# Patient Record
Sex: Female | Born: 1945
Health system: Southern US, Academic
[De-identification: ages and names within clinical notes are randomized; demographics above are authoritative.]

## PROBLEM LIST (undated history)

## (undated) ENCOUNTER — Telehealth

## (undated) ENCOUNTER — Encounter

## (undated) DIAGNOSIS — J45909 Unspecified asthma, uncomplicated: Secondary | ICD-10-CM

## (undated) DIAGNOSIS — M5430 Sciatica, unspecified side: Secondary | ICD-10-CM

## (undated) DIAGNOSIS — M549 Dorsalgia, unspecified: Secondary | ICD-10-CM

## (undated) DIAGNOSIS — C8590 Non-Hodgkin lymphoma, unspecified, unspecified site: Secondary | ICD-10-CM

## (undated) DIAGNOSIS — K838 Other specified diseases of biliary tract: Secondary | ICD-10-CM

## (undated) DIAGNOSIS — D630 Anemia in neoplastic disease: Secondary | ICD-10-CM

## (undated) DIAGNOSIS — Z9221 Personal history of antineoplastic chemotherapy: Secondary | ICD-10-CM

## (undated) DIAGNOSIS — J069 Acute upper respiratory infection, unspecified: Secondary | ICD-10-CM

## (undated) DIAGNOSIS — J9600 Acute respiratory failure, unspecified whether with hypoxia or hypercapnia: Secondary | ICD-10-CM

## (undated) DIAGNOSIS — I771 Stricture of artery: Secondary | ICD-10-CM

## (undated) DIAGNOSIS — E039 Hypothyroidism, unspecified: Secondary | ICD-10-CM

## (undated) HISTORY — DX: Anemia in neoplastic disease: D63.0

## (undated) HISTORY — DX: Other specified diseases of biliary tract: K83.8

## (undated) HISTORY — PX: NASAL SINUS SURGERY: SHX719

## (undated) HISTORY — DX: Sciatica, unspecified side: M54.30

## (undated) HISTORY — DX: Non-Hodgkin lymphoma, unspecified, unspecified site: C85.90

## (undated) HISTORY — DX: Acute respiratory failure, unspecified whether with hypoxia or hypercapnia: J96.00

## (undated) HISTORY — DX: Dorsalgia, unspecified: M54.9

## (undated) HISTORY — DX: Unspecified asthma, uncomplicated: J45.909

## (undated) HISTORY — DX: Acute upper respiratory infection, unspecified: J06.9

## (undated) HISTORY — DX: Stricture of artery: I77.1

## (undated) HISTORY — DX: Hypothyroidism, unspecified: E03.9

---

## 2000-05-15 HISTORY — PX: BONE MARROW TRANSPLANT: SHX200

## 2004-07-10 ENCOUNTER — Ambulatory Visit: Payer: Self-pay | Admitting: Oncology

## 2004-07-22 ENCOUNTER — Ambulatory Visit: Payer: Self-pay | Admitting: Oncology

## 2004-10-25 ENCOUNTER — Ambulatory Visit: Payer: Self-pay | Admitting: Unknown Physician Specialty

## 2004-10-30 ENCOUNTER — Ambulatory Visit: Payer: Self-pay | Admitting: Oncology

## 2004-12-08 ENCOUNTER — Other Ambulatory Visit: Payer: Self-pay

## 2004-12-08 ENCOUNTER — Inpatient Hospital Stay: Payer: Self-pay | Admitting: Internal Medicine

## 2004-12-09 ENCOUNTER — Other Ambulatory Visit: Payer: Self-pay

## 2004-12-14 ENCOUNTER — Ambulatory Visit: Payer: Self-pay | Admitting: Internal Medicine

## 2004-12-17 ENCOUNTER — Ambulatory Visit: Payer: Self-pay | Admitting: Oncology

## 2004-12-20 ENCOUNTER — Ambulatory Visit: Payer: Self-pay | Admitting: Oncology

## 2005-03-01 ENCOUNTER — Ambulatory Visit: Payer: Self-pay | Admitting: Oncology

## 2005-03-22 ENCOUNTER — Ambulatory Visit: Payer: Self-pay | Admitting: Oncology

## 2005-04-21 ENCOUNTER — Ambulatory Visit: Payer: Self-pay | Admitting: Oncology

## 2005-05-22 ENCOUNTER — Ambulatory Visit: Payer: Self-pay | Admitting: Oncology

## 2005-06-21 ENCOUNTER — Ambulatory Visit: Payer: Self-pay | Admitting: Oncology

## 2005-07-22 ENCOUNTER — Ambulatory Visit: Payer: Self-pay | Admitting: Oncology

## 2005-08-07 ENCOUNTER — Ambulatory Visit: Payer: Self-pay | Admitting: Unknown Physician Specialty

## 2005-09-09 ENCOUNTER — Ambulatory Visit: Payer: Self-pay | Admitting: Unknown Physician Specialty

## 2005-09-25 ENCOUNTER — Ambulatory Visit: Payer: Self-pay | Admitting: Oncology

## 2005-09-27 ENCOUNTER — Ambulatory Visit: Payer: Self-pay | Admitting: Oncology

## 2005-10-20 ENCOUNTER — Ambulatory Visit: Payer: Self-pay | Admitting: Oncology

## 2005-11-19 ENCOUNTER — Ambulatory Visit: Payer: Self-pay | Admitting: Oncology

## 2006-01-27 ENCOUNTER — Ambulatory Visit: Payer: Self-pay | Admitting: Oncology

## 2006-02-14 ENCOUNTER — Emergency Department: Payer: Self-pay | Admitting: Emergency Medicine

## 2006-02-17 ENCOUNTER — Inpatient Hospital Stay: Payer: Self-pay | Admitting: Oncology

## 2006-02-21 ENCOUNTER — Ambulatory Visit: Payer: Self-pay | Admitting: Oncology

## 2006-03-22 ENCOUNTER — Ambulatory Visit: Payer: Self-pay | Admitting: Oncology

## 2006-04-21 ENCOUNTER — Ambulatory Visit: Payer: Self-pay | Admitting: Oncology

## 2006-07-04 ENCOUNTER — Ambulatory Visit: Payer: Self-pay | Admitting: Oncology

## 2006-07-22 ENCOUNTER — Ambulatory Visit: Payer: Self-pay | Admitting: Oncology

## 2006-10-24 ENCOUNTER — Ambulatory Visit: Payer: Self-pay | Admitting: Internal Medicine

## 2006-10-29 ENCOUNTER — Ambulatory Visit: Payer: Self-pay | Admitting: Oncology

## 2006-11-20 ENCOUNTER — Ambulatory Visit: Payer: Self-pay | Admitting: Oncology

## 2006-12-08 ENCOUNTER — Ambulatory Visit: Payer: Self-pay | Admitting: Oncology

## 2007-02-20 ENCOUNTER — Ambulatory Visit: Payer: Self-pay | Admitting: Oncology

## 2007-02-26 ENCOUNTER — Ambulatory Visit: Payer: Self-pay | Admitting: Oncology

## 2007-03-23 ENCOUNTER — Ambulatory Visit: Payer: Self-pay | Admitting: Oncology

## 2007-06-22 ENCOUNTER — Ambulatory Visit: Payer: Self-pay | Admitting: Oncology

## 2007-06-25 ENCOUNTER — Ambulatory Visit: Payer: Self-pay | Admitting: Oncology

## 2007-07-23 ENCOUNTER — Ambulatory Visit: Payer: Self-pay | Admitting: Oncology

## 2007-08-23 ENCOUNTER — Ambulatory Visit: Payer: Self-pay | Admitting: Oncology

## 2007-09-20 ENCOUNTER — Ambulatory Visit: Payer: Self-pay | Admitting: Oncology

## 2007-12-21 ENCOUNTER — Ambulatory Visit: Payer: Self-pay | Admitting: Oncology

## 2007-12-31 ENCOUNTER — Ambulatory Visit: Payer: Self-pay | Admitting: Oncology

## 2008-01-20 ENCOUNTER — Ambulatory Visit: Payer: Self-pay | Admitting: Oncology

## 2008-06-21 ENCOUNTER — Ambulatory Visit: Payer: Self-pay | Admitting: Oncology

## 2008-06-29 ENCOUNTER — Ambulatory Visit: Payer: Self-pay | Admitting: Oncology

## 2008-07-22 ENCOUNTER — Ambulatory Visit: Payer: Self-pay | Admitting: Oncology

## 2008-12-20 ENCOUNTER — Ambulatory Visit: Payer: Self-pay | Admitting: Oncology

## 2009-01-04 ENCOUNTER — Ambulatory Visit: Payer: Self-pay | Admitting: Oncology

## 2009-01-19 ENCOUNTER — Ambulatory Visit: Payer: Self-pay | Admitting: Oncology

## 2009-02-19 ENCOUNTER — Ambulatory Visit: Payer: Self-pay | Admitting: Oncology

## 2009-06-21 ENCOUNTER — Ambulatory Visit: Payer: Self-pay | Admitting: Oncology

## 2009-07-05 ENCOUNTER — Ambulatory Visit: Payer: Self-pay | Admitting: Oncology

## 2009-07-22 ENCOUNTER — Ambulatory Visit: Payer: Self-pay | Admitting: Oncology

## 2009-09-15 ENCOUNTER — Inpatient Hospital Stay: Payer: Self-pay | Admitting: Internal Medicine

## 2010-02-21 ENCOUNTER — Ambulatory Visit: Payer: Self-pay | Admitting: Internal Medicine

## 2010-05-21 ENCOUNTER — Ambulatory Visit: Payer: Self-pay | Admitting: Unknown Physician Specialty

## 2010-06-27 ENCOUNTER — Ambulatory Visit: Payer: Self-pay | Admitting: Oncology

## 2010-07-05 ENCOUNTER — Ambulatory Visit: Payer: Self-pay | Admitting: Oncology

## 2010-07-22 ENCOUNTER — Ambulatory Visit: Payer: Self-pay | Admitting: Oncology

## 2011-01-29 ENCOUNTER — Encounter: Payer: Self-pay | Admitting: Internal Medicine

## 2011-01-29 ENCOUNTER — Ambulatory Visit (INDEPENDENT_AMBULATORY_CARE_PROVIDER_SITE_OTHER): Payer: Medicare Other | Admitting: Internal Medicine

## 2011-01-29 DIAGNOSIS — R05 Cough: Secondary | ICD-10-CM

## 2011-01-29 DIAGNOSIS — R0989 Other specified symptoms and signs involving the circulatory and respiratory systems: Secondary | ICD-10-CM

## 2011-01-29 DIAGNOSIS — R0600 Dyspnea, unspecified: Secondary | ICD-10-CM

## 2011-01-29 MED ORDER — PREDNISONE (PAK) 10 MG PO TABS: ORAL_TABLET | ORAL | Status: AC

## 2011-01-29 MED ORDER — AMOXICILLIN-POT CLAVULANATE 875-125 MG PO TABS: 1.0000 | ORAL_TABLET | Freq: Two times a day (BID) | ORAL | Status: AC

## 2011-01-29 NOTE — Assessment & Plan Note (Signed)
The most common causes of chronic cough in immunocompetent adults include the following: upper airway cough syndrome (UACS), previously referred to as postnasal drip syndrome (PNDS), which is caused by variety of rhinosinus conditions; (2) asthma; (3) GERD; (4) chronic bronchitis from cigarette smoking or other inhaled environmental irritants; (5) nonasthmatic eosinophilic bronchitis; and (6) bronchiectasis.   These conditions, singly or in combination, have accounted for up to 94% of the causes of chronic cough in prospective studies.   Other conditions have constituted no >6% of the causes in prospective studies These have included bronchogenic carcinoma, chronic interstitial pneumonia, sarcoidosis, left ventricular failure, ACEI-induced cough, and aspiration from a condition associated with pharyngeal dysfunction.   This is almost certainly a form of  Classic Upper airway cough syndrome, so named because it's frequently impossible to sort out how much is  CR/sinusitis with freq throat clearing (which can be related to primary GERD)   vs  causing  secondary (" extra esophageal")  GERD from wide swings in gastric pressure that occur with throat clearing, often  promoting self use of mint and menthol lozenges that reduce the lower esophageal sphincter tone and exacerbate the problem further in a cyclical fashion.   These are the same pts who not infrequently have failed to tolerate ace inhibitors,  dry powder inhalers or biphosphonates or report having reflux symptoms that don't respond to standard doses of PPI , and are easily confused as having aecopd or asthma flares,   Will rx aggressively for cr/sinusitis then f/u here or with Dr Jenne Campus before going any further

## 2011-01-29 NOTE — Patient Instructions (Signed)
See flyer regarding treating of chronic sinusitis  Augmentin take with bfast and with supper with large glass of water for 21 days then call me or Jenne Campus if not satisfied  Prednisone 10 mg take  4 each am x 2 days,   2 each am x 2 days,  1 each am x2days and stop    If you are satisfied with your treatment plan let your doctor know and he/she can either refill your medications or you can return here when your prescription runs out.     If in any way you are not 100% satisfied,  please tell us.  If 100% better, tell your friends!

## 2011-01-29 NOTE — Progress Notes (Signed)
Subjective:     Patient ID: Anna Mcbride, female   DOB: 05-12-1946, 65 y.o.   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 Recurrence which didn't prove to be the case. And no treatment since 2002  01/29/2011 Initial pulmonary office eval cc intermittent episodes of cough and sob x 5 years, always recover completely but still took advair and flonase and combivent prn between these episodes  then  Always sick since Jan 2012 when  "caught cold" =  Sore throat , sinus congestion, cough > dry rattle and wheezing and short of breath with yellow green some better but frustrated that takes so many abx and wants to try something else.  Sob with more than slow adls.  Symptoms day > night.  Sleeping ok without nocturnal  or early am exac of resp c/o's or need for noct saba.  Pt denies any significant  dysphagia, itching, sneezing,  fever, chills, sweats, unintended wt loss, pleuritic or exertional cp, hempoptysis, orthopnea pnd or leg swelling.    Also denies any obvious fluctuation of symptoms with weather or environmental changes or other aggravating or alleviating factors.  No better on advair    Review of Systems  Constitutional: Negative for fever, chills and unexpected weight change.  HENT: Positive for congestion and sore throat. Negative for ear pain, nosebleeds, rhinorrhea, sneezing, trouble swallowing, dental problem, voice change, postnasal drip and sinus pressure.   Eyes: Negative for visual disturbance.  Respiratory: Positive for cough and shortness of breath. Negative for choking.   Cardiovascular: Negative for chest pain and leg swelling.  Gastrointestinal: Negative for vomiting, abdominal pain and diarrhea.  Genitourinary: Negative for difficulty urinating.  Musculoskeletal: Negative for arthralgias.  Skin: Negative for rash.  Neurological: Negative for  tremors, syncope and headaches.  Hematological: Does not bruise/bleed easily.       Objective:   Physical Exam Wt 129 01/29/2011 amb thin wf nad with rattling upper airway cough on fvc maneuver only HEENT mild turbinate edema.  Oropharynx no thrush or excess pnd or cobblestoning.  No JVD or cervical adenopathy. Mild accessory muscle hypertrophy. Trachea midline, nl thryroid. Chest was hyperinflated by percussion with diminished breath sounds and moderate increased exp time without wheeze. Hoover sign positive at mid inspiration. Regular rate and rhythm without murmur gallop or rub or increase P2 or edema.  Abd: no hsm, nl excursion. Ext warm without cyanosis or clubbing.      cxr 01/25/11 no acute changes, has post treatment changes on R with superior retraction of the right hilum unchanged since 09/19/10  Assessment:          Plan:

## 2011-03-12 ENCOUNTER — Ambulatory Visit: Payer: Self-pay | Admitting: Internal Medicine

## 2011-05-22 ENCOUNTER — Ambulatory Visit: Payer: Self-pay | Admitting: Oncology

## 2011-05-23 ENCOUNTER — Ambulatory Visit: Payer: Self-pay | Admitting: Oncology

## 2011-06-04 ENCOUNTER — Ambulatory Visit: Payer: Self-pay | Admitting: Oncology

## 2011-06-22 ENCOUNTER — Ambulatory Visit: Payer: Self-pay | Admitting: Oncology

## 2011-06-25 ENCOUNTER — Ambulatory Visit: Payer: Self-pay | Admitting: Internal Medicine

## 2011-06-26 ENCOUNTER — Ambulatory Visit: Payer: Self-pay | Admitting: Oncology

## 2011-07-04 LAB — PATHOLOGY REPORT

## 2011-07-23 ENCOUNTER — Ambulatory Visit: Payer: Self-pay | Admitting: Oncology

## 2011-10-03 ENCOUNTER — Ambulatory Visit: Payer: Self-pay | Admitting: Oncology

## 2011-10-03 LAB — COMPREHENSIVE METABOLIC PANEL
Anion Gap: 6 — ABNORMAL LOW (ref 7–16)
BUN: 27 mg/dL — ABNORMAL HIGH (ref 7–18)
Bilirubin,Total: 0.3 mg/dL (ref 0.2–1.0)
Chloride: 107 mmol/L (ref 98–107)
Co2: 32 mmol/L (ref 21–32)
Creatinine: 0.92 mg/dL (ref 0.60–1.30)
EGFR (African American): >60
EGFR (Non-African Amer.): >60
Glucose: 77 mg/dL (ref 65–99)
Osmolality: 293 (ref 275–301)
Potassium: 3.9 mmol/L (ref 3.5–5.1)
SGOT(AST): 17 U/L (ref 15–37)
SGPT (ALT): 26 U/L
Sodium: 145 mmol/L (ref 136–145)
Total Protein: 7.3 g/dL (ref 6.4–8.2)

## 2011-10-03 LAB — CBC CANCER CENTER
Basophil #: 0 x10 3/mm (ref 0.0–0.1)
Basophil %: 0.6 %
Eosinophil #: 0.6 x10 3/mm (ref 0.0–0.7)
Eosinophil %: 10.8 %
HGB: 12.4 g/dL (ref 12.0–16.0)
Lymphocyte #: 1.1 x10 3/mm (ref 1.0–3.6)
Lymphocyte %: 20.6 %
MCHC: 32.6 g/dL (ref 32.0–36.0)
Neutrophil %: 58.5 %
Platelet: 151 x10 3/mm (ref 150–440)
RDW: 13.5 % (ref 11.5–14.5)
WBC: 5.3 x10 3/mm (ref 3.6–11.0)

## 2011-10-03 LAB — LACTATE DEHYDROGENASE: LDH: 165 U/L (ref 84–246)

## 2011-10-21 ENCOUNTER — Ambulatory Visit: Payer: Self-pay | Admitting: Oncology

## 2011-11-05 LAB — CREATININE, SERUM
Creatinine: 0.79 mg/dL
EGFR (African American): >60
EGFR (Non-African Amer.): >60

## 2011-11-20 ENCOUNTER — Ambulatory Visit: Payer: Self-pay | Admitting: Oncology

## 2011-12-25 ENCOUNTER — Ambulatory Visit: Payer: Self-pay | Admitting: Oncology

## 2011-12-25 LAB — COMPREHENSIVE METABOLIC PANEL
Albumin: 3.5 g/dL (ref 3.4–5.0)
Anion Gap: 8 (ref 7–16)
Calcium, Total: 9.3 mg/dL (ref 8.5–10.1)
Chloride: 103 mmol/L (ref 98–107)
Co2: 30 mmol/L (ref 21–32)
EGFR (Non-African Amer.): >60
Potassium: 4.1 mmol/L (ref 3.5–5.1)
SGPT (ALT): 21 U/L
Sodium: 141 mmol/L (ref 136–145)
Total Protein: 7.4 g/dL (ref 6.4–8.2)

## 2011-12-25 LAB — CBC CANCER CENTER
Basophil #: 0 x10 3/mm (ref 0.0–0.1)
Eosinophil #: 0.5 x10 3/mm (ref 0.0–0.7)
Eosinophil %: 6.8 %
HCT: 39.3 % (ref 35.0–47.0)
Lymphocyte #: 1.3 x10 3/mm (ref 1.0–3.6)
MCV: 102 fL — ABNORMAL HIGH (ref 80–100)
Monocyte #: 0.8 x10 3/mm (ref 0.2–0.9)
Monocyte %: 11.1 %
Neutrophil #: 4.5 x10 3/mm (ref 1.4–6.5)
Platelet: 146 x10 3/mm — ABNORMAL LOW (ref 150–440)
RBC: 3.86 10*6/uL (ref 3.80–5.20)
WBC: 7.1 x10 3/mm (ref 3.6–11.0)

## 2012-01-20 ENCOUNTER — Ambulatory Visit: Payer: Self-pay | Admitting: Oncology

## 2012-03-17 ENCOUNTER — Ambulatory Visit: Payer: Self-pay | Admitting: Internal Medicine

## 2012-03-30 ENCOUNTER — Ambulatory Visit: Payer: Self-pay | Admitting: Oncology

## 2012-03-30 LAB — CBC CANCER CENTER
Basophil %: 1.3 %
Eosinophil #: 0.3 x10 3/mm (ref 0.0–0.7)
HCT: 36.5 % (ref 35.0–47.0)
HGB: 12.7 g/dL (ref 12.0–16.0)
Lymphocyte #: 1.3 x10 3/mm (ref 1.0–3.6)
MCH: 34.5 pg — ABNORMAL HIGH (ref 26.0–34.0)
MCHC: 34.7 g/dL (ref 32.0–36.0)
MCV: 99 fL (ref 80–100)
Monocyte #: 0.6 x10 3/mm (ref 0.2–0.9)
Neutrophil #: 3.7 x10 3/mm (ref 1.4–6.5)
RBC: 3.67 10*6/uL — ABNORMAL LOW (ref 3.80–5.20)
WBC: 6 x10 3/mm (ref 3.6–11.0)

## 2012-03-30 LAB — COMPREHENSIVE METABOLIC PANEL
Albumin: 4 g/dL (ref 3.4–5.0)
Alkaline Phosphatase: 66 U/L (ref 50–136)
BUN: 38 mg/dL — ABNORMAL HIGH (ref 7–18)
Bilirubin,Total: 0.5 mg/dL (ref 0.2–1.0)
Calcium, Total: 9.9 mg/dL (ref 8.5–10.1)
Chloride: 105 mmol/L (ref 98–107)
Creatinine: 0.92 mg/dL (ref 0.60–1.30)
EGFR (African American): >60
EGFR (Non-African Amer.): >60
Glucose: 80 mg/dL (ref 65–99)
Potassium: 3.8 mmol/L (ref 3.5–5.1)
SGOT(AST): 24 U/L (ref 15–37)
SGPT (ALT): 22 U/L (ref 12–78)
Total Protein: 7.2 g/dL (ref 6.4–8.2)

## 2012-04-21 ENCOUNTER — Ambulatory Visit: Payer: Self-pay | Admitting: Oncology

## 2012-05-22 ENCOUNTER — Ambulatory Visit: Payer: Self-pay | Admitting: Oncology

## 2012-09-19 ENCOUNTER — Ambulatory Visit: Payer: Self-pay | Admitting: Oncology

## 2012-09-28 LAB — CBC CANCER CENTER
Basophil #: 0.1 "x10 3/mm "
Basophil %: 1.2 %
Eosinophil #: 0.3 "x10 3/mm "
Eosinophil %: 6.7 %
HCT: 37.8 %
HGB: 12.8 g/dL
Lymphocyte %: 19.6 %
Lymphs Abs: 0.9 "x10 3/mm " — ABNORMAL LOW
MCH: 33.7 pg
MCHC: 33.7 g/dL
MCV: 100 fL
Monocyte #: 0.6 "x10 3/mm "
Monocyte %: 13.5 %
Neutrophil #: 2.6 "x10 3/mm "
Neutrophil %: 59 %
Platelet: 135 "x10 3/mm " — ABNORMAL LOW
RBC: 3.79 "x10 6/mm " — ABNORMAL LOW
RDW: 13.1 %
WBC: 4.4 "x10 3/mm "

## 2012-09-28 LAB — COMPREHENSIVE METABOLIC PANEL
Alkaline Phosphatase: 83 U/L (ref 50–136)
BUN: 23 mg/dL — ABNORMAL HIGH (ref 7–18)
Bilirubin,Total: 0.2 mg/dL (ref 0.2–1.0)
Calcium, Total: 9.1 mg/dL (ref 8.5–10.1)
Chloride: 103 mmol/L (ref 98–107)
Co2: 30 mmol/L (ref 21–32)
EGFR (African American): >60
EGFR (Non-African Amer.): >60
Glucose: 93 mg/dL (ref 65–99)
SGPT (ALT): 23 U/L (ref 12–78)
Sodium: 141 mmol/L (ref 136–145)

## 2012-10-09 ENCOUNTER — Encounter: Payer: Self-pay | Admitting: Unknown Physician Specialty

## 2012-10-20 ENCOUNTER — Ambulatory Visit: Payer: Self-pay | Admitting: Oncology

## 2012-10-20 ENCOUNTER — Encounter: Payer: Self-pay | Admitting: Unknown Physician Specialty

## 2013-03-03 ENCOUNTER — Ambulatory Visit: Payer: Self-pay | Admitting: Oncology

## 2013-03-03 LAB — CREATININE, SERUM
Creatinine: 0.96 mg/dL (ref 0.60–1.30)
EGFR (Non-African Amer.): >60

## 2013-03-18 ENCOUNTER — Ambulatory Visit: Payer: Self-pay | Admitting: Internal Medicine

## 2013-03-22 ENCOUNTER — Ambulatory Visit: Payer: Self-pay | Admitting: Oncology

## 2013-03-30 ENCOUNTER — Ambulatory Visit: Payer: Self-pay | Admitting: Oncology

## 2013-03-31 LAB — CBC CANCER CENTER
Basophil #: 0.1 "x10 3/mm "
Basophil %: 1.3 %
Eosinophil #: 1.3 "x10 3/mm " — ABNORMAL HIGH
Eosinophil %: 21 %
HCT: 36.6 %
HGB: 12 g/dL
Lymphocyte %: 17.4 %
Lymphs Abs: 1.1 "x10 3/mm "
MCH: 32.1 pg
MCHC: 32.9 g/dL
MCV: 98 fL
Monocyte #: 0.6 "x10 3/mm "
Monocyte %: 10.2 %
Neutrophil #: 3.1 "x10 3/mm "
Neutrophil %: 50.1 %
Platelet: 150 "x10 3/mm "
RBC: 3.75 "x10 6/mm " — ABNORMAL LOW
RDW: 14.5 %
WBC: 6.2 "x10 3/mm "

## 2013-03-31 LAB — COMPREHENSIVE METABOLIC PANEL WITH GFR
Albumin: 3.8 g/dL
Alkaline Phosphatase: 85 U/L
Anion Gap: 9
BUN: 21 mg/dL — ABNORMAL HIGH
Bilirubin,Total: 0.5 mg/dL
Calcium, Total: 9.6 mg/dL
Chloride: 104 mmol/L
Co2: 28 mmol/L
Creatinine: 0.87 mg/dL
EGFR (African American): >60
EGFR (Non-African Amer.): >60
Glucose: 95 mg/dL
Osmolality: 284
Potassium: 3.7 mmol/L
SGOT(AST): 12 U/L — ABNORMAL LOW
SGPT (ALT): 20 U/L
Sodium: 141 mmol/L
Total Protein: 7.1 g/dL

## 2013-03-31 LAB — LACTATE DEHYDROGENASE: LDH: 161 U/L (ref 81–246)

## 2013-03-31 LAB — SEDIMENTATION RATE: Erythrocyte Sed Rate: 34 mm/h — ABNORMAL HIGH

## 2013-04-16 ENCOUNTER — Ambulatory Visit: Payer: Self-pay | Admitting: Internal Medicine

## 2013-04-16 ENCOUNTER — Emergency Department: Payer: Self-pay | Admitting: Emergency Medicine

## 2013-04-16 LAB — URINALYSIS, COMPLETE
Bacteria: NONE SEEN
Bilirubin,UR: NEGATIVE
Glucose,UR: NEGATIVE mg/dL (ref 0–75)
Ketone: NEGATIVE
Leukocyte Esterase: NEGATIVE
Nitrite: NEGATIVE
Ph: 5 (ref 4.5–8.0)
Protein: NEGATIVE
RBC,UR: <1 /HPF (ref 0–5)
Specific Gravity: 1.011 (ref 1.003–1.030)
WBC UR: NONE SEEN /HPF (ref 0–5)

## 2013-04-16 LAB — COMPREHENSIVE METABOLIC PANEL
BUN: 15 mg/dL (ref 7–18)
Bilirubin,Total: 0.4 mg/dL (ref 0.2–1.0)
Calcium, Total: 8.7 mg/dL (ref 8.5–10.1)
Chloride: 108 mmol/L — ABNORMAL HIGH (ref 98–107)
Co2: 28 mmol/L (ref 21–32)
Creatinine: 0.79 mg/dL (ref 0.60–1.30)
EGFR (African American): >60
EGFR (Non-African Amer.): >60
Glucose: 87 mg/dL (ref 65–99)
Potassium: 3.8 mmol/L (ref 3.5–5.1)
SGOT(AST): 19 U/L (ref 15–37)
SGPT (ALT): 18 U/L (ref 12–78)
Total Protein: 6.6 g/dL (ref 6.4–8.2)

## 2013-04-16 LAB — CBC
MCV: 97 fL (ref 80–100)
RBC: 3.62 10*6/uL — ABNORMAL LOW (ref 3.80–5.20)
WBC: 8.7 10*3/uL (ref 3.6–11.0)

## 2013-04-19 LAB — COMPREHENSIVE METABOLIC PANEL
Alkaline Phosphatase: 75 U/L (ref 50–136)
BUN: 16 mg/dL (ref 7–18)
Chloride: 103 mmol/L (ref 98–107)
Creatinine: 0.86 mg/dL (ref 0.60–1.30)
EGFR (African American): >60
EGFR (Non-African Amer.): >60
Glucose: 88 mg/dL (ref 65–99)
Potassium: 4.1 mmol/L (ref 3.5–5.1)
SGOT(AST): 12 U/L — ABNORMAL LOW (ref 15–37)
SGPT (ALT): 17 U/L (ref 12–78)
Sodium: 142 mmol/L (ref 136–145)

## 2013-04-19 LAB — CBC CANCER CENTER
Basophil #: 0.1 x10 3/mm (ref 0.0–0.1)
Basophil %: 1.4 %
Eosinophil %: 8.8 %
HCT: 38.9 % (ref 35.0–47.0)
HGB: 13.2 g/dL (ref 12.0–16.0)
MCH: 33.1 pg (ref 26.0–34.0)
MCHC: 34 g/dL (ref 32.0–36.0)
MCV: 97 fL (ref 80–100)
Monocyte #: 0.7 x10 3/mm (ref 0.2–0.9)
Monocyte %: 12 %
Neutrophil %: 58.2 %
RBC: 4 10*6/uL (ref 3.80–5.20)

## 2013-04-20 ENCOUNTER — Ambulatory Visit (INDEPENDENT_AMBULATORY_CARE_PROVIDER_SITE_OTHER): Payer: Medicare Other | Admitting: General Surgery

## 2013-04-20 ENCOUNTER — Encounter: Payer: Self-pay | Admitting: General Surgery

## 2013-04-20 VITALS — BP 118/62 | HR 68 | Resp 14 | Ht 63.0 in | Wt 130.0 lb

## 2013-04-20 DIAGNOSIS — R590 Localized enlarged lymph nodes: Secondary | ICD-10-CM | POA: Insufficient documentation

## 2013-04-20 DIAGNOSIS — R599 Enlarged lymph nodes, unspecified: Secondary | ICD-10-CM

## 2013-04-20 DIAGNOSIS — Z87898 Personal history of other specified conditions: Secondary | ICD-10-CM

## 2013-04-20 DIAGNOSIS — Z8579 Personal history of other malignant neoplasms of lymphoid, hematopoietic and related tissues: Secondary | ICD-10-CM

## 2013-04-20 NOTE — Patient Instructions (Signed)
Patient to return tomorrow for an excision of a right neck node.

## 2013-04-20 NOTE — Progress Notes (Signed)
Patient ID: Anna Mcbride, female   DOB: [birth year], 67 y.o.   MRN: [id number]  Chief Complaint  Patient presents with  . Other    new patient evaluate right neck node     HPI Anna Mcbride is a 67 y.o. female who presents for an evaluation of a right neck node. 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 The patient states she noticed the node last Thursday 04/15/13. She states she was having abdominal pain in the left upper abdomen so she underwent some tests to showed an enlarged spleen. She has had biopsy of the lungs. EBUS final report is pending. Initial shows no reoccurrence of lymphoma.  HPI  Past Medical History  Diagnosis Date  . Non Hodgkin's lymphoma   . Asthma     Past Surgical History  Procedure Laterality Date  . 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 Nasal sinus surgery  08/07/05 09/09/05    x 2     Family History  Problem Relation Age of Onset  . Asthma Son     Social History History  Substance Use Topics  . Smoking status: Never Smoker   . Smokeless tobacco: Never Used  . Alcohol Use: Yes    Allergies  Allergen Reactions  . Tussionex Pennkinetic Er [Hydrocod Polst-Cpm Polst Er] Itching  . Ciprofloxacin Rash    Current Outpatient Prescriptions  Medication Sig Dispense Refill  . Albuterol Sulfate (VENTOLIN HFA IN) Inhale 2 puffs into the lungs as needed.      [name] aspirin 81 MG tablet Take 81 mg by mouth daily.        . cetirizine (ZYRTEC) 10 MG tablet Take 10 mg by mouth daily.      . citalopram (CELEXA) 20 MG tablet Take 20 mg by mouth daily.        . Fluticasone-Salmeterol (ADVAIR DISKUS) 250-50 MCG/DOSE AEPB Inhale 1 puff into the lungs daily.        [name] levothyroxine (SYNTHROID, LEVOTHROID) 25 MCG tablet Take 25 mcg by mouth daily.        . Multiple Vitamins-Minerals (CENTRUM SILVER PO) Take 1 tablet by mouth daily.        [name] omeprazole (PRILOSEC) 20 MG capsule Take 1 capsule by mouth daily.       No current  facility-administered medications for this visit.    Review of Systems Review of Systems  Constitutional: Negative.   Respiratory: Negative.   Cardiovascular: Negative.     Blood pressure 118/62, pulse 68, resp. rate 14, height 5\' 3"  (1.6 m), weight 130 lb (58.968 kg).  Physical Exam Physical Exam  Constitutional: She is oriented to person, place, and time. She appears well-developed and well-nourished.  Eyes: Conjunctivae are normal. No scleral icterus.  Neck: No thyromegaly present.  Cardiovascular: Normal rate, regular rhythm and normal heart sounds.   No murmur heard. Pulmonary/Chest: Effort normal and breath sounds normal.  Abdominal: Soft. Normal appearance and bowel sounds are normal. There is splenomegaly (palpable tip of spleen under left costal margin.).  Lymphadenopathy:    She has cervical adenopathy (1 cm firm to hard lymph node mid posterior triangle on right. ).       Right cervical: Posterior cervical adenopathy present.    She has no axillary adenopathy.  Neurological: She is alert and oriented to person, place, and time.  Skin: Skin is warm and dry.    Data Reviewed Dr. Aleda Grana notes  Assessment    History of  large cell lymphoma. Recent splenic enlargement and now with a new right cervical node.     Plan    Excision of cervical node. If it does not show recurrent lymphoma, will consider splenectomy. Discussed fully with pt and she is agreeable. Node biopsy tomorrow here in office.        Khris Jansson G 04/21/2013, 9:00 AM

## 2013-04-21 ENCOUNTER — Ambulatory Visit: Payer: Self-pay | Admitting: Oncology

## 2013-04-21 ENCOUNTER — Encounter: Payer: Self-pay | Admitting: General Surgery

## 2013-04-21 ENCOUNTER — Other Ambulatory Visit: Payer: Self-pay | Admitting: General Surgery

## 2013-04-21 ENCOUNTER — Ambulatory Visit (INDEPENDENT_AMBULATORY_CARE_PROVIDER_SITE_OTHER): Payer: Medicare Other | Admitting: General Surgery

## 2013-04-21 VITALS — BP 130/68 | HR 62 | Resp 14 | Ht 63.0 in | Wt 130.0 lb

## 2013-04-21 DIAGNOSIS — R599 Enlarged lymph nodes, unspecified: Secondary | ICD-10-CM

## 2013-04-21 DIAGNOSIS — R590 Localized enlarged lymph nodes: Secondary | ICD-10-CM

## 2013-04-21 LAB — CULTURE, BLOOD (SINGLE)

## 2013-04-21 NOTE — Progress Notes (Signed)
The patient presents for a right neck node excision.   Procedure: Excision right posterior cervical node.  Patient was placed in the left lateral position. The palpable node in the posterior triangle was adequately exposed. 5 mL of  0.5% Marcaine mixed with 1% Xylocaine was instilled. Area was prepped with ChloraPrep and draped. A transverse skin incision was made and dissected down to expose a 1 cm slightly firm lymph node. The lymph node was have freed and removed without difficulty. It was sent fresh to pathology for processing. Bleeding was controlled with the disposable cautery. Wound closed with 3-0 Vicryl in the deep tissue and subcuticular 4-0 Vicryl and the skin. A dressing applied with Steri-Strips Telfa and Tegaderm. No immediate problems encountered from the procedure.

## 2013-04-21 NOTE — Patient Instructions (Addendum)
Patient advised to remove outer dressing in 2-3 days. Patient to contact our office with any questions or concerns.

## 2013-04-24 LAB — PATHOLOGY

## 2013-04-27 ENCOUNTER — Other Ambulatory Visit: Payer: Self-pay | Admitting: General Surgery

## 2013-04-27 ENCOUNTER — Telehealth: Payer: Self-pay | Admitting: *Deleted

## 2013-04-27 ENCOUNTER — Encounter: Payer: Self-pay | Admitting: General Surgery

## 2013-04-27 DIAGNOSIS — C8590 Non-Hodgkin lymphoma, unspecified, unspecified site: Secondary | ICD-10-CM

## 2013-04-27 NOTE — Telephone Encounter (Signed)
Message copied by [name] on Tue Apr 27, 2013  9:16 AM ------      Message from: Kieth Brightly      Created: Mon Apr 26, 2013 10:06 AM       Dr. Doylene Canning notified. He will inform pt today. He has requested a port, please schedule after Dr. Doylene Canning calls to confirm this afternoon. ------

## 2013-04-27 NOTE — Progress Notes (Signed)
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 Pt has been advised by Dr. Doylene Canning. Port placement planned for 04/30/13

## 2013-04-27 NOTE — Telephone Encounter (Signed)
This patient states Dr. Aleda Grana office will be starting chemo on 05-04-13. Patient's port placement has been scheduled for 04-30-13 at [facility].  She will call the office if she has further questions.

## 2013-04-30 ENCOUNTER — Ambulatory Visit: Payer: Self-pay | Admitting: General Surgery

## 2013-04-30 DIAGNOSIS — C8589 Other specified types of non-Hodgkin lymphoma, extranodal and solid organ sites: Secondary | ICD-10-CM

## 2013-05-03 ENCOUNTER — Encounter: Payer: Self-pay | Admitting: General Surgery

## 2013-05-03 ENCOUNTER — Ambulatory Visit: Payer: Medicare Other | Admitting: General Surgery

## 2013-05-05 LAB — CBC CANCER CENTER
Basophil #: 0.1 x10 3/mm (ref 0.0–0.1)
Basophil %: 1.2 %
Eosinophil #: 0.6 x10 3/mm (ref 0.0–0.7)
Eosinophil %: 9.6 %
HGB: 12.2 g/dL (ref 12.0–16.0)
Lymphocyte #: 0.9 x10 3/mm — ABNORMAL LOW (ref 1.0–3.6)
MCH: 33.5 pg (ref 26.0–34.0)
Monocyte %: 9.9 %
Neutrophil #: 3.8 x10 3/mm (ref 1.4–6.5)
Neutrophil %: 63.8 %
RDW: 14.5 % (ref 11.5–14.5)
WBC: 6 x10 3/mm (ref 3.6–11.0)

## 2013-05-05 LAB — COMPREHENSIVE METABOLIC PANEL
Albumin: 3.4 g/dL (ref 3.4–5.0)
Anion Gap: 14 (ref 7–16)
BUN: 16 mg/dL (ref 7–18)
Calcium, Total: 8.9 mg/dL (ref 8.5–10.1)
Chloride: 103 mmol/L (ref 98–107)
Creatinine: 0.92 mg/dL (ref 0.60–1.30)
EGFR (African American): >60
SGOT(AST): 15 U/L (ref 15–37)
Total Protein: 6.8 g/dL (ref 6.4–8.2)

## 2013-05-05 LAB — LACTATE DEHYDROGENASE: LDH: 188 U/L (ref 81–246)

## 2013-05-12 LAB — CBC CANCER CENTER
Basophil %: 2.3 %
Eosinophil %: 12.3 %
HCT: 34.1 % — ABNORMAL LOW (ref 35.0–47.0)
HGB: 11.9 g/dL — ABNORMAL LOW (ref 12.0–16.0)
Lymphocyte %: 23 %
MCH: 33.7 pg (ref 26.0–34.0)
MCV: 97 fL (ref 80–100)
Monocyte #: 0 x10 3/mm — ABNORMAL LOW (ref 0.2–0.9)
Monocyte %: 3.5 %
Neutrophil #: 0.7 x10 3/mm — ABNORMAL LOW (ref 1.4–6.5)
Neutrophil %: 58.9 %
Platelet: 81 x10 3/mm — ABNORMAL LOW (ref 150–440)
RBC: 3.52 10*6/uL — ABNORMAL LOW (ref 3.80–5.20)
RDW: 13.8 % (ref 11.5–14.5)

## 2013-05-12 LAB — COMP PANEL: LEUKEMIA/LYMPHOMA

## 2013-05-12 LAB — IMMUNOHISTOCHEMICAL STAIN(S)

## 2013-05-13 ENCOUNTER — Inpatient Hospital Stay: Payer: Self-pay | Admitting: Oncology

## 2013-05-13 ENCOUNTER — Ambulatory Visit: Payer: Medicare Other | Admitting: General Surgery

## 2013-05-13 LAB — URINALYSIS, COMPLETE
Bilirubin,UR: NEGATIVE
Glucose,UR: 50 mg/dL (ref 0–75)
Granular Cast: 29
Hyaline Cast: 3
Protein: 100

## 2013-05-13 LAB — CBC CANCER CENTER
Basophil #: 0 x10 3/mm (ref 0.0–0.1)
Basophil %: 5.7 %
Eosinophil %: 15.6 %
HCT: 31.1 % — ABNORMAL LOW (ref 35.0–47.0)
HGB: 10.8 g/dL — ABNORMAL LOW (ref 12.0–16.0)
Lymphocyte %: 62.8 %
MCH: 33.5 pg (ref 26.0–34.0)
MCHC: 34.7 g/dL (ref 32.0–36.0)
Monocyte #: 0 x10 3/mm — ABNORMAL LOW (ref 0.2–0.9)
Neutrophil #: 0 x10 3/mm — ABNORMAL LOW (ref 1.4–6.5)
Neutrophil %: 6.2 %
Platelet: 58 x10 3/mm — ABNORMAL LOW (ref 150–440)
RDW: 13.6 % (ref 11.5–14.5)

## 2013-05-13 LAB — BASIC METABOLIC PANEL
Anion Gap: 8 (ref 7–16)
Co2: 23 mmol/L (ref 21–32)
Creatinine: 0.89 mg/dL (ref 0.60–1.30)
EGFR (Non-African Amer.): >60
Glucose: 91 mg/dL (ref 65–99)
Osmolality: 275 (ref 275–301)
Sodium: 136 mmol/L (ref 136–145)

## 2013-05-14 LAB — CBC WITH DIFFERENTIAL/PLATELET
Basophil %: 3.9 %
HGB: 8.5 g/dL — ABNORMAL LOW (ref 12.0–16.0)
Lymphocyte %: 70.4 %
MCH: 34.3 pg — ABNORMAL HIGH (ref 26.0–34.0)
MCHC: 36.3 g/dL — ABNORMAL HIGH (ref 32.0–36.0)
MCV: 95 fL (ref 80–100)
Monocyte #: 0 x10 3/mm — ABNORMAL LOW (ref 0.2–0.9)
Monocyte %: 12.2 %
Neutrophil %: 3.6 %
Platelet: 29 10*3/uL — CL (ref 150–440)
RDW: 13.4 % (ref 11.5–14.5)
WBC: 0.3 10*3/uL — CL (ref 3.6–11.0)

## 2013-05-14 LAB — URINE CULTURE

## 2013-05-14 LAB — BASIC METABOLIC PANEL
BUN: 15 mg/dL (ref 7–18)
Calcium, Total: 8.2 mg/dL — ABNORMAL LOW (ref 8.5–10.1)
Chloride: 111 mmol/L — ABNORMAL HIGH (ref 98–107)
Co2: 22 mmol/L (ref 21–32)
Creatinine: 1.19 mg/dL (ref 0.60–1.30)
EGFR (Non-African Amer.): 47 — ABNORMAL LOW
Glucose: 101 mg/dL — ABNORMAL HIGH (ref 65–99)
Potassium: 2.9 mmol/L — ABNORMAL LOW (ref 3.5–5.1)
Sodium: 141 mmol/L (ref 136–145)

## 2013-05-14 LAB — VANCOMYCIN, TROUGH: Vancomycin, Trough: 17 ug/mL (ref 10–20)

## 2013-05-15 LAB — CBC WITH DIFFERENTIAL/PLATELET
Basophil #: 0 10*3/uL (ref 0.0–0.1)
Basophil %: 0 %
Eosinophil %: 6.1 %
HCT: 22 % — ABNORMAL LOW (ref 35.0–47.0)
Lymphocyte #: 0.2 10*3/uL — ABNORMAL LOW (ref 1.0–3.6)
Lymphocyte %: 83.5 %
MCH: 34.2 pg — ABNORMAL HIGH (ref 26.0–34.0)
MCV: 94 fL (ref 80–100)
Monocyte #: 0 x10 3/mm — ABNORMAL LOW (ref 0.2–0.9)
Neutrophil #: 0 10*3/uL — ABNORMAL LOW (ref 1.4–6.5)
RDW: 13.6 % (ref 11.5–14.5)

## 2013-05-15 LAB — COMPREHENSIVE METABOLIC PANEL
Albumin: 2.4 g/dL — ABNORMAL LOW (ref 3.4–5.0)
BUN: 8 mg/dL (ref 7–18)
Bilirubin,Total: 0.5 mg/dL (ref 0.2–1.0)
Chloride: 107 mmol/L (ref 98–107)
Co2: 24 mmol/L (ref 21–32)
Creatinine: 1.1 mg/dL (ref 0.60–1.30)
EGFR (African American): 59 — ABNORMAL LOW
EGFR (Non-African Amer.): 51 — ABNORMAL LOW
SGOT(AST): 9 U/L — ABNORMAL LOW (ref 15–37)
SGPT (ALT): 19 U/L (ref 12–78)
Sodium: 138 mmol/L (ref 136–145)

## 2013-05-15 LAB — OCCULT BLOOD X 1 CARD TO LAB, STOOL: Occult Blood, Feces: NEGATIVE

## 2013-05-16 LAB — POTASSIUM: Potassium: 2.8 mmol/L — ABNORMAL LOW (ref 3.5–5.1)

## 2013-05-16 LAB — CBC WITH DIFFERENTIAL/PLATELET
Basophil #: 0 10*3/uL (ref 0.0–0.1)
Basophil %: 0 %
Eosinophil #: 0 10*3/uL (ref 0.0–0.7)
Eosinophil %: 7 %
HCT: 22 % — ABNORMAL LOW (ref 35.0–47.0)
HGB: 7.9 g/dL — ABNORMAL LOW (ref 12.0–16.0)
Lymphocyte #: 0.2 10*3/uL — ABNORMAL LOW (ref 1.0–3.6)
Lymphocyte %: 69.8 %
MCH: 34 pg (ref 26.0–34.0)
MCHC: 36.1 g/dL — ABNORMAL HIGH (ref 32.0–36.0)
MCV: 94 fL (ref 80–100)
Monocyte #: 0 x10 3/mm — ABNORMAL LOW (ref 0.2–0.9)
Monocyte %: 11.5 %
Neutrophil #: 0 10*3/uL — ABNORMAL LOW (ref 1.4–6.5)
Neutrophil %: 11.7 %
RBC: 2.34 10*6/uL — ABNORMAL LOW (ref 3.80–5.20)
RDW: 13.3 % (ref 11.5–14.5)
WBC: 0.3 10*3/uL — CL (ref 3.6–11.0)

## 2013-05-16 LAB — PLATELET COUNT
Platelet: 7 10*3/uL — CL (ref 150–440)
Platelet: 38 10*3/uL — ABNORMAL LOW (ref 150–440)

## 2013-05-16 LAB — VANCOMYCIN, TROUGH: Vancomycin, Trough: 7 ug/mL — ABNORMAL LOW (ref 10–20)

## 2013-05-16 LAB — MAGNESIUM: Magnesium: 1.3 mg/dL — ABNORMAL LOW

## 2013-05-17 LAB — CBC WITH DIFFERENTIAL/PLATELET
Eosinophil #: 0 10*3/uL (ref 0.0–0.7)
Eosinophil %: 4.5 %
HCT: 21.6 % — ABNORMAL LOW (ref 35.0–47.0)
Lymphocyte %: 41.5 %
MCH: 33.8 pg (ref 26.0–34.0)
MCHC: 35.9 g/dL (ref 32.0–36.0)
MCV: 94 fL (ref 80–100)
Monocyte #: 0 x10 3/mm — ABNORMAL LOW (ref 0.2–0.9)
Neutrophil #: 0.2 10*3/uL — ABNORMAL LOW (ref 1.4–6.5)
Neutrophil %: 42.8 %
RBC: 2.3 10*6/uL — ABNORMAL LOW (ref 3.80–5.20)
RDW: 13.2 % (ref 11.5–14.5)

## 2013-05-17 LAB — BASIC METABOLIC PANEL
Anion Gap: 5 — ABNORMAL LOW (ref 7–16)
Calcium, Total: 8.7 mg/dL (ref 8.5–10.1)
Chloride: 108 mmol/L — ABNORMAL HIGH (ref 98–107)
Glucose: 97 mg/dL (ref 65–99)
Osmolality: 280 (ref 275–301)
Potassium: 4.1 mmol/L (ref 3.5–5.1)

## 2013-05-17 LAB — MAGNESIUM: Magnesium: 1.7 mg/dL — ABNORMAL LOW

## 2013-05-18 LAB — CBC WITH DIFFERENTIAL/PLATELET
Basophil #: 0 10*3/uL (ref 0.0–0.1)
Eosinophil #: 0 10*3/uL (ref 0.0–0.7)
HCT: 21.6 % — ABNORMAL LOW (ref 35.0–47.0)
HGB: 7.7 g/dL — ABNORMAL LOW (ref 12.0–16.0)
Lymphocyte #: 0.2 10*3/uL — ABNORMAL LOW (ref 1.0–3.6)
Lymphocyte %: 22.6 %
MCHC: 35.6 g/dL (ref 32.0–36.0)
MCV: 95 fL (ref 80–100)
Monocyte #: 0.1 x10 3/mm — ABNORMAL LOW (ref 0.2–0.9)
Monocyte %: 9.4 %
Neutrophil %: 65.8 %
RBC: 2.29 10*6/uL — ABNORMAL LOW (ref 3.80–5.20)
RDW: 13.2 % (ref 11.5–14.5)
WBC: 0.8 10*3/uL — CL (ref 3.6–11.0)

## 2013-05-18 LAB — CULTURE, BLOOD (SINGLE)

## 2013-05-19 LAB — CBC CANCER CENTER
Basophil %: 0.1 %
Eosinophil %: 0.6 %
HGB: 7.9 g/dL — ABNORMAL LOW (ref 12.0–16.0)
MCV: 95 fL (ref 80–100)
Monocyte #: 0.3 x10 3/mm (ref 0.2–0.9)
Neutrophil #: 2.3 x10 3/mm (ref 1.4–6.5)
Neutrophil %: 80.8 %
Platelet: 65 x10 3/mm — ABNORMAL LOW (ref 150–440)
RBC: 2.41 10*6/uL — ABNORMAL LOW (ref 3.80–5.20)
RDW: 13.3 % (ref 11.5–14.5)
WBC: 2.9 x10 3/mm — ABNORMAL LOW (ref 3.6–11.0)

## 2013-05-22 ENCOUNTER — Ambulatory Visit: Payer: Self-pay | Admitting: Oncology

## 2013-05-26 LAB — CBC CANCER CENTER
Basophil #: 0 x10 3/mm (ref 0.0–0.1)
Basophil %: 0.6 %
Eosinophil #: 0 x10 3/mm (ref 0.0–0.7)
Eosinophil %: 0.6 %
HCT: 23.4 % — ABNORMAL LOW (ref 35.0–47.0)
Lymphocyte %: 10.8 %
Monocyte #: 0.7 x10 3/mm (ref 0.2–0.9)
Monocyte %: 14.2 %
Neutrophil #: 3.9 x10 3/mm (ref 1.4–6.5)
Neutrophil %: 73.8 %
WBC: 5.2 x10 3/mm (ref 3.6–11.0)

## 2013-05-26 LAB — COMPREHENSIVE METABOLIC PANEL
Alkaline Phosphatase: 93 U/L (ref 50–136)
BUN: 20 mg/dL — ABNORMAL HIGH (ref 7–18)
Bilirubin,Total: 0.2 mg/dL (ref 0.2–1.0)
Chloride: 106 mmol/L (ref 98–107)
Co2: 28 mmol/L (ref 21–32)
Creatinine: 0.93 mg/dL (ref 0.60–1.30)
EGFR (African American): >60
Osmolality: 281 (ref 275–301)
Potassium: 3.3 mmol/L — ABNORMAL LOW (ref 3.5–5.1)
Sodium: 139 mmol/L (ref 136–145)
Total Protein: 6.6 g/dL (ref 6.4–8.2)

## 2013-06-02 LAB — CBC CANCER CENTER
Basophil #: 0 x10 3/mm (ref 0.0–0.1)
Eosinophil %: 1.2 %
Lymphocyte #: 0.3 x10 3/mm — ABNORMAL LOW (ref 1.0–3.6)
Lymphocyte %: 4.6 %
MCV: 98 fL (ref 80–100)
Monocyte #: 0.1 x10 3/mm — ABNORMAL LOW (ref 0.2–0.9)
Neutrophil %: 92.9 %
RBC: 2.53 10*6/uL — ABNORMAL LOW (ref 3.80–5.20)

## 2013-06-07 LAB — CBC CANCER CENTER
Basophil #: 0.1 x10 3/mm (ref 0.0–0.1)
Basophil %: 1.8 %
Eosinophil %: 0.5 %
HCT: 20.6 % — ABNORMAL LOW (ref 35.0–47.0)
HGB: 7 g/dL — ABNORMAL LOW (ref 12.0–16.0)
Lymphocyte #: 0.4 x10 3/mm — ABNORMAL LOW (ref 1.0–3.6)
Lymphocyte %: 11.7 %
MCH: 32.9 pg (ref 26.0–34.0)
MCHC: 34.1 g/dL (ref 32.0–36.0)
MCV: 96 fL (ref 80–100)
Monocyte %: 15.8 %
Neutrophil #: 2.6 x10 3/mm (ref 1.4–6.5)
Neutrophil %: 70.2 %
Platelet: 15 x10 3/mm — CL (ref 150–440)
RBC: 2.13 10*6/uL — ABNORMAL LOW (ref 3.80–5.20)
RDW: 13.6 % (ref 11.5–14.5)
WBC: 3.6 x10 3/mm (ref 3.6–11.0)

## 2013-06-07 LAB — COMPREHENSIVE METABOLIC PANEL
Anion Gap: 11 (ref 7–16)
Bilirubin,Total: 0.4 mg/dL (ref 0.2–1.0)
Co2: 26 mmol/L (ref 21–32)
EGFR (African American): >60
Osmolality: 283 (ref 275–301)
SGOT(AST): 20 U/L (ref 15–37)
SGPT (ALT): 24 U/L (ref 12–78)
Sodium: 140 mmol/L (ref 136–145)

## 2013-06-08 LAB — CBC CANCER CENTER
Basophil %: 0.8 %
Eosinophil #: 0 x10 3/mm (ref 0.0–0.7)
HCT: 18.4 % — ABNORMAL LOW (ref 35.0–47.0)
HGB: 6.4 g/dL — ABNORMAL LOW (ref 12.0–16.0)
Lymphocyte #: 0.5 x10 3/mm — ABNORMAL LOW (ref 1.0–3.6)
Lymphocyte %: 5.8 %
MCH: 33.8 pg (ref 26.0–34.0)
MCHC: 34.8 g/dL (ref 32.0–36.0)
MCV: 97 fL (ref 80–100)
Neutrophil #: 8.2 x10 3/mm — ABNORMAL HIGH (ref 1.4–6.5)
Neutrophil %: 85.8 %
Platelet: 52 x10 3/mm — ABNORMAL LOW (ref 150–440)
RDW: 13.2 % (ref 11.5–14.5)
WBC: 9.5 x10 3/mm (ref 3.6–11.0)

## 2013-06-09 LAB — CBC CANCER CENTER
Basophil #: 0.1 x10 3/mm (ref 0.0–0.1)
Eosinophil #: 0 x10 3/mm (ref 0.0–0.7)
Eosinophil %: 0.1 %
HGB: 9.6 g/dL — ABNORMAL LOW (ref 12.0–16.0)
Lymphocyte #: 0.7 x10 3/mm — ABNORMAL LOW (ref 1.0–3.6)
Lymphocyte %: 3.9 %
MCH: 32.8 pg (ref 26.0–34.0)
MCHC: 35.3 g/dL (ref 32.0–36.0)
Monocyte #: 0.8 x10 3/mm (ref 0.2–0.9)
Monocyte %: 4.4 %
Neutrophil #: 16.6 x10 3/mm — ABNORMAL HIGH (ref 1.4–6.5)
Neutrophil %: 91.3 %
Platelet: 42 x10 3/mm — ABNORMAL LOW (ref 150–440)
RBC: 2.93 10*6/uL — ABNORMAL LOW (ref 3.80–5.20)
RDW: 14.5 % (ref 11.5–14.5)

## 2013-06-14 ENCOUNTER — Ambulatory Visit: Payer: Self-pay | Admitting: Oncology

## 2013-06-15 ENCOUNTER — Encounter: Payer: Self-pay | Admitting: *Deleted

## 2013-06-16 LAB — COMPREHENSIVE METABOLIC PANEL
Alkaline Phosphatase: 95 U/L
Anion Gap: 7 (ref 7–16)
EGFR (African American): >60
Glucose: 89 mg/dL (ref 65–99)
Osmolality: 286 (ref 275–301)
SGOT(AST): 17 U/L (ref 15–37)
SGPT (ALT): 29 U/L (ref 12–78)
Sodium: 143 mmol/L (ref 136–145)
Total Protein: 6.4 g/dL (ref 6.4–8.2)

## 2013-06-16 LAB — CBC CANCER CENTER
Basophil %: 0.4 %
Eosinophil %: 0.7 %
HCT: 27.1 % — ABNORMAL LOW (ref 35.0–47.0)
HGB: 9.3 g/dL — ABNORMAL LOW (ref 12.0–16.0)
MCH: 33.3 pg (ref 26.0–34.0)
MCHC: 34.5 g/dL (ref 32.0–36.0)
MCV: 97 fL (ref 80–100)
Monocyte #: 1 x10 3/mm — ABNORMAL HIGH (ref 0.2–0.9)
Monocyte %: 11.8 %
Neutrophil %: 80.6 %
RBC: 2.8 10*6/uL — ABNORMAL LOW (ref 3.80–5.20)
RDW: 14.6 % — ABNORMAL HIGH (ref 11.5–14.5)
WBC: 8.6 x10 3/mm (ref 3.6–11.0)

## 2013-06-21 ENCOUNTER — Ambulatory Visit: Payer: Self-pay | Admitting: Oncology

## 2013-06-28 LAB — CBC CANCER CENTER
Eosinophil #: 0.2 x10 3/mm (ref 0.0–0.7)
Lymphocyte #: 0.4 x10 3/mm — ABNORMAL LOW (ref 1.0–3.6)
Lymphocyte %: 1 %
MCH: 34 pg (ref 26.0–34.0)
MCHC: 33.5 g/dL (ref 32.0–36.0)
MCV: 101 fL — ABNORMAL HIGH (ref 80–100)
Monocyte #: 0.3 x10 3/mm (ref 0.2–0.9)
Monocyte %: 0.8 %
Neutrophil %: 97.7 %
RDW: 19.2 % — ABNORMAL HIGH (ref 11.5–14.5)
WBC: 39 x10 3/mm — ABNORMAL HIGH (ref 3.6–11.0)

## 2013-06-30 DIAGNOSIS — C8590 Non-Hodgkin lymphoma, unspecified, unspecified site: Secondary | ICD-10-CM

## 2013-06-30 HISTORY — DX: Non-Hodgkin lymphoma, unspecified, unspecified site: C85.90

## 2013-06-30 LAB — COMPREHENSIVE METABOLIC PANEL
Albumin: 3.2 g/dL — ABNORMAL LOW (ref 3.4–5.0)
Alkaline Phosphatase: 102 U/L
Anion Gap: 12 (ref 7–16)
Calcium, Total: 8.5 mg/dL (ref 8.5–10.1)
Chloride: 108 mmol/L — ABNORMAL HIGH (ref 98–107)
Co2: 22 mmol/L (ref 21–32)
Creatinine: 0.89 mg/dL (ref 0.60–1.30)
EGFR (African American): >60
EGFR (Non-African Amer.): >60
Glucose: 96 mg/dL (ref 65–99)
Osmolality: 288 (ref 275–301)
SGOT(AST): 24 U/L (ref 15–37)
Total Protein: 6.2 g/dL — ABNORMAL LOW (ref 6.4–8.2)

## 2013-06-30 LAB — CBC CANCER CENTER
Basophil #: 0 x10 3/mm (ref 0.0–0.1)
Eosinophil #: 0.1 x10 3/mm (ref 0.0–0.7)
HGB: 7.6 g/dL — ABNORMAL LOW (ref 12.0–16.0)
Lymphocyte %: 6.3 %
MCHC: 33.9 g/dL (ref 32.0–36.0)
MCV: 101 fL — ABNORMAL HIGH (ref 80–100)
Monocyte #: 0.1 x10 3/mm — ABNORMAL LOW (ref 0.2–0.9)
Monocyte %: 1.2 %
Neutrophil #: 6.4 x10 3/mm (ref 1.4–6.5)
Neutrophil %: 91.4 %
Platelet: 54 x10 3/mm — ABNORMAL LOW (ref 150–440)
RBC: 2.24 10*6/uL — ABNORMAL LOW (ref 3.80–5.20)
RDW: 19.6 % — ABNORMAL HIGH (ref 11.5–14.5)

## 2013-07-01 DIAGNOSIS — E039 Hypothyroidism, unspecified: Secondary | ICD-10-CM

## 2013-07-01 HISTORY — DX: Hypothyroidism, unspecified: E03.9

## 2013-07-02 LAB — CBC CANCER CENTER
Eosinophil #: 0.1 x10 3/mm (ref 0.0–0.7)
Eosinophil %: 14.9 %
Lymphocyte #: 0.3 x10 3/mm — ABNORMAL LOW (ref 1.0–3.6)
Lymphocyte %: 51.7 %
MCH: 34.1 pg — ABNORMAL HIGH (ref 26.0–34.0)
MCV: 98 fL (ref 80–100)
Monocyte #: 0.1 x10 3/mm — ABNORMAL LOW (ref 0.2–0.9)
Monocyte %: 20.2 %
Neutrophil #: 0 x10 3/mm — ABNORMAL LOW (ref 1.4–6.5)
Neutrophil %: 7.1 %
WBC: 0.5 x10 3/mm — CL (ref 3.6–11.0)

## 2013-07-03 ENCOUNTER — Inpatient Hospital Stay: Payer: Self-pay | Admitting: Family Medicine

## 2013-07-03 LAB — CBC WITH DIFFERENTIAL/PLATELET
Basophil #: 0 x10 3/mm 3
Basophil %: 0 %
Eosinophil #: 0 x10 3/mm 3
Eosinophil %: 2.4 %
HCT: 22 % — ABNORMAL LOW
HGB: 7.6 g/dL — ABNORMAL LOW
Lymphocyte %: 31.7 %
Lymphs Abs: 0.2 x10 3/mm 3 — ABNORMAL LOW
MCH: 32.8 pg
MCHC: 34.5 g/dL
MCV: 95 fL
Monocyte #: 0.3 "x10 3/mm "
Monocyte %: 47.1 %
Neutrophil #: 0.1 x10 3/mm 3 — ABNORMAL LOW
Neutrophil %: 18.8 %
Platelet: 27 x10 3/mm 3 — CL
RBC: 2.31 X10 6/mm 3 — ABNORMAL LOW
RDW: 18.5 % — ABNORMAL HIGH
WBC: 0.6 x10 3/mm 3 — CL

## 2013-07-03 LAB — URINALYSIS, COMPLETE
Bilirubin,UR: NEGATIVE
Glucose,UR: 150 mg/dL
Ketone: NEGATIVE
Leukocyte Esterase: NEGATIVE
Nitrite: NEGATIVE
Ph: 5
Protein: NEGATIVE
RBC,UR: 1 /HPF
Specific Gravity: 1.008
Squamous Epithelial: <1
WBC UR: 1 /HPF

## 2013-07-03 LAB — COMPREHENSIVE METABOLIC PANEL WITH GFR
Albumin: 3.1 g/dL — ABNORMAL LOW
Alkaline Phosphatase: 90 U/L
Anion Gap: 4 — ABNORMAL LOW
BUN: 20 mg/dL — ABNORMAL HIGH
Bilirubin,Total: 1 mg/dL
Calcium, Total: 8.5 mg/dL
Chloride: 106 mmol/L
Co2: 27 mmol/L
Creatinine: 0.92 mg/dL
EGFR (African American): >60
EGFR (Non-African Amer.): >60
Glucose: 105 mg/dL — ABNORMAL HIGH
Osmolality: 277
Potassium: 2.8 mmol/L — ABNORMAL LOW
SGOT(AST): 25 U/L
SGPT (ALT): 35 U/L
Sodium: 137 mmol/L
Total Protein: 6 g/dL — ABNORMAL LOW

## 2013-07-03 LAB — RAPID INFLUENZA A&B ANTIGENS

## 2013-07-04 LAB — COMPREHENSIVE METABOLIC PANEL
Albumin: 2.7 g/dL — ABNORMAL LOW (ref 3.4–5.0)
Alkaline Phosphatase: 79 U/L
Bilirubin,Total: 0.8 mg/dL (ref 0.2–1.0)
Chloride: 111 mmol/L — ABNORMAL HIGH (ref 98–107)
Creatinine: 0.84 mg/dL (ref 0.60–1.30)
Glucose: 105 mg/dL — ABNORMAL HIGH (ref 65–99)
SGOT(AST): 17 U/L (ref 15–37)
SGPT (ALT): 29 U/L (ref 12–78)

## 2013-07-04 LAB — CBC WITH DIFFERENTIAL/PLATELET
Basophil #: 0 10*3/uL (ref 0.0–0.1)
Eosinophil %: 2.2 %
HGB: 6.8 g/dL — ABNORMAL LOW (ref 12.0–16.0)
Lymphocyte #: 0.2 10*3/uL — ABNORMAL LOW (ref 1.0–3.6)
Lymphocyte %: 21.5 %
MCH: 32.7 pg (ref 26.0–34.0)
MCHC: 34.6 g/dL (ref 32.0–36.0)
MCV: 95 fL (ref 80–100)
Monocyte %: 38.7 %
Platelet: 23 10*3/uL — CL (ref 150–440)
RBC: 2.07 10*6/uL — ABNORMAL LOW (ref 3.80–5.20)
RDW: 18.7 % — ABNORMAL HIGH (ref 11.5–14.5)
WBC: 1.1 10*3/uL — CL (ref 3.6–11.0)

## 2013-07-05 LAB — BASIC METABOLIC PANEL
Anion Gap: 3 — ABNORMAL LOW (ref 7–16)
Co2: 29 mmol/L (ref 21–32)
Creatinine: 0.89 mg/dL (ref 0.60–1.30)
EGFR (African American): >60
EGFR (Non-African Amer.): >60
Glucose: 87 mg/dL (ref 65–99)
Osmolality: 282 (ref 275–301)

## 2013-07-05 LAB — CBC WITH DIFFERENTIAL/PLATELET
Basophil #: 0 10*3/uL (ref 0.0–0.1)
HGB: 9.1 g/dL — ABNORMAL LOW (ref 12.0–16.0)
Lymphocyte #: 0.2 10*3/uL — ABNORMAL LOW (ref 1.0–3.6)
Lymphocyte %: 11.9 %
Monocyte #: 0.3 x10 3/mm (ref 0.2–0.9)
Monocyte %: 16.5 %
Neutrophil #: 1.3 10*3/uL — ABNORMAL LOW (ref 1.4–6.5)
Platelet: 9 10*3/uL — CL (ref 150–440)
RBC: 2.89 10*6/uL — ABNORMAL LOW (ref 3.80–5.20)
RDW: 18.5 % — ABNORMAL HIGH (ref 11.5–14.5)
WBC: 1.9 10*3/uL — CL (ref 3.6–11.0)

## 2013-07-07 LAB — COMPREHENSIVE METABOLIC PANEL
Alkaline Phosphatase: 94 U/L
Anion Gap: 10 (ref 7–16)
BUN: 17 mg/dL (ref 7–18)
Bilirubin,Total: 0.5 mg/dL (ref 0.2–1.0)
Calcium, Total: 8.7 mg/dL (ref 8.5–10.1)
Co2: 27 mmol/L (ref 21–32)
Creatinine: 1 mg/dL (ref 0.60–1.30)
EGFR (African American): >60
Osmolality: 286 (ref 275–301)
SGPT (ALT): 27 U/L (ref 12–78)
Sodium: 142 mmol/L (ref 136–145)
Total Protein: 6.4 g/dL (ref 6.4–8.2)

## 2013-07-07 LAB — CBC CANCER CENTER
Basophil %: 0.5 %
Eosinophil #: 0 x10 3/mm (ref 0.0–0.7)
HCT: 28.4 % — ABNORMAL LOW (ref 35.0–47.0)
HGB: 9.7 g/dL — ABNORMAL LOW (ref 12.0–16.0)
Lymphocyte %: 10.5 %
MCH: 31.5 pg (ref 26.0–34.0)
MCV: 93 fL (ref 80–100)
Monocyte #: 0.3 x10 3/mm (ref 0.2–0.9)
Monocyte %: 10.3 %
Neutrophil %: 77.7 %
Platelet: 15 x10 3/mm — CL (ref 150–440)
WBC: 3.4 x10 3/mm — ABNORMAL LOW (ref 3.6–11.0)

## 2013-07-08 LAB — CULTURE, BLOOD (SINGLE)

## 2013-07-09 LAB — POTASSIUM: Potassium: 3.1 mmol/L — ABNORMAL LOW (ref 3.5–5.1)

## 2013-07-12 LAB — CBC CANCER CENTER
Basophil %: 0.7 %
Eosinophil %: 0.8 %
HGB: 9.9 g/dL — ABNORMAL LOW (ref 12.0–16.0)
Lymphocyte %: 16.2 %
MCH: 32 pg (ref 26.0–34.0)
MCHC: 33.9 g/dL (ref 32.0–36.0)
MCV: 94 fL (ref 80–100)
Monocyte #: 0.5 x10 3/mm (ref 0.2–0.9)
Neutrophil #: 2.6 x10 3/mm (ref 1.4–6.5)
Neutrophil %: 68.6 %
Platelet: 42 x10 3/mm — ABNORMAL LOW (ref 150–440)
RBC: 3.11 10*6/uL — ABNORMAL LOW (ref 3.80–5.20)

## 2013-07-19 LAB — CBC CANCER CENTER
Basophil #: 0.1 x10 3/mm (ref 0.0–0.1)
Basophil %: 0.7 %
Eosinophil %: 0.3 %
HCT: 30.4 % — ABNORMAL LOW (ref 35.0–47.0)
HGB: 10.2 g/dL — ABNORMAL LOW (ref 12.0–16.0)
Lymphocyte %: 8.5 %
MCH: 32.6 pg (ref 26.0–34.0)
Monocyte %: 13.5 %
Neutrophil #: 8.4 x10 3/mm — ABNORMAL HIGH (ref 1.4–6.5)
Neutrophil %: 77 %
RBC: 3.14 10*6/uL — ABNORMAL LOW (ref 3.80–5.20)

## 2013-07-19 LAB — COMPREHENSIVE METABOLIC PANEL
Albumin: 3.3 g/dL — ABNORMAL LOW (ref 3.4–5.0)
Alkaline Phosphatase: 97 U/L
Anion Gap: 7 (ref 7–16)
Bilirubin,Total: 0.6 mg/dL (ref 0.2–1.0)
Calcium, Total: 9.2 mg/dL (ref 8.5–10.1)
Creatinine: 1.07 mg/dL (ref 0.60–1.30)
EGFR (African American): >60
Osmolality: 275 (ref 275–301)
SGPT (ALT): 49 U/L (ref 12–78)

## 2013-07-19 LAB — RAPID INFLUENZA A&B ANTIGENS

## 2013-07-22 ENCOUNTER — Ambulatory Visit: Payer: Self-pay | Admitting: Oncology

## 2013-07-26 LAB — COMPREHENSIVE METABOLIC PANEL
ALBUMIN: 3 g/dL — AB (ref 3.4–5.0)
ALK PHOS: 87 U/L
ALT: 20 U/L (ref 12–78)
Anion Gap: 9 (ref 7–16)
BILIRUBIN TOTAL: 0.3 mg/dL (ref 0.2–1.0)
BUN: 25 mg/dL — ABNORMAL HIGH (ref 7–18)
CO2: 27 mmol/L (ref 21–32)
CREATININE: 0.99 mg/dL (ref 0.60–1.30)
Calcium, Total: 9.1 mg/dL (ref 8.5–10.1)
Chloride: 104 mmol/L (ref 98–107)
EGFR (African American): >60
GFR CALC NON AF AMER: 58 — AB
GLUCOSE: 92 mg/dL (ref 65–99)
OSMOLALITY: 283 (ref 275–301)
Potassium: 3.3 mmol/L — ABNORMAL LOW (ref 3.5–5.1)
SGOT(AST): 14 U/L — ABNORMAL LOW (ref 15–37)
Sodium: 140 mmol/L (ref 136–145)
TOTAL PROTEIN: 6.5 g/dL (ref 6.4–8.2)

## 2013-07-26 LAB — CBC CANCER CENTER
BASOS ABS: 0.1 x10 3/mm (ref 0.0–0.1)
BASOS PCT: 1.1 %
EOS ABS: 0.2 x10 3/mm (ref 0.0–0.7)
Eosinophil %: 3.2 %
HCT: 29 % — ABNORMAL LOW (ref 35.0–47.0)
HGB: 9.8 g/dL — ABNORMAL LOW (ref 12.0–16.0)
LYMPHS PCT: 11.9 %
Lymphocyte #: 0.7 x10 3/mm — ABNORMAL LOW (ref 1.0–3.6)
MCH: 32.6 pg (ref 26.0–34.0)
MCHC: 33.6 g/dL (ref 32.0–36.0)
MCV: 97 fL (ref 80–100)
MONOS PCT: 17.5 %
Monocyte #: 1 x10 3/mm — ABNORMAL HIGH (ref 0.2–0.9)
NEUTROS ABS: 3.7 x10 3/mm (ref 1.4–6.5)
NEUTROS PCT: 66.3 %
Platelet: 199 x10 3/mm (ref 150–440)
RBC: 2.99 10*6/uL — ABNORMAL LOW (ref 3.80–5.20)
RDW: 23.2 % — ABNORMAL HIGH (ref 11.5–14.5)
WBC: 5.5 x10 3/mm (ref 3.6–11.0)

## 2013-08-02 LAB — CBC CANCER CENTER
Basophil #: 0 x10 3/mm (ref 0.0–0.1)
Basophil %: 0.5 %
EOS ABS: 0.1 x10 3/mm (ref 0.0–0.7)
Eosinophil %: 2.3 %
HCT: 27.2 % — AB (ref 35.0–47.0)
HGB: 9.2 g/dL — ABNORMAL LOW (ref 12.0–16.0)
Lymphocyte #: 0.3 x10 3/mm — ABNORMAL LOW (ref 1.0–3.6)
Lymphocyte %: 6.5 %
MCH: 33.7 pg (ref 26.0–34.0)
MCHC: 34 g/dL (ref 32.0–36.0)
MCV: 99 fL (ref 80–100)
MONO ABS: 0.1 x10 3/mm — AB (ref 0.2–0.9)
MONOS PCT: 1.6 %
NEUTROS PCT: 89.1 %
Neutrophil #: 4.6 x10 3/mm (ref 1.4–6.5)
Platelet: 60 x10 3/mm — ABNORMAL LOW (ref 150–440)
RBC: 2.74 10*6/uL — ABNORMAL LOW (ref 3.80–5.20)
RDW: 22.5 % — AB (ref 11.5–14.5)
WBC: 5.2 x10 3/mm (ref 3.6–11.0)

## 2013-08-04 LAB — CBC CANCER CENTER
Basophil #: 0 x10 3/mm (ref 0.0–0.1)
Basophil %: 7.4 %
Eosinophil #: 0.1 x10 3/mm (ref 0.0–0.7)
Eosinophil %: 20.8 %
HCT: 24 % — ABNORMAL LOW (ref 35.0–47.0)
HGB: 8 g/dL — ABNORMAL LOW (ref 12.0–16.0)
Lymphocyte #: 0.2 x10 3/mm — ABNORMAL LOW (ref 1.0–3.6)
Lymphocyte %: 59 %
MCH: 33.1 pg (ref 26.0–34.0)
MCHC: 33.5 g/dL (ref 32.0–36.0)
MCV: 99 fL (ref 80–100)
MONO ABS: 0 x10 3/mm — AB (ref 0.2–0.9)
MONOS PCT: 10.1 %
Neutrophil #: 0 x10 3/mm — ABNORMAL LOW (ref 1.4–6.5)
Neutrophil %: 2.7 %
Platelet: 17 x10 3/mm — CL (ref 150–440)
RBC: 2.43 10*6/uL — ABNORMAL LOW (ref 3.80–5.20)
RDW: 22 % — AB (ref 11.5–14.5)
WBC: 0.4 x10 3/mm — AB (ref 3.6–11.0)

## 2013-08-06 LAB — CANCER CTR PLATELET CT: PLATELETS: 4 x10 3/mm — AB (ref 150–440)

## 2013-08-09 LAB — CBC CANCER CENTER
BASOS PCT: 0.5 %
Basophil #: 0 x10 3/mm (ref 0.0–0.1)
Eosinophil #: 0.1 x10 3/mm (ref 0.0–0.7)
Eosinophil %: 1.5 %
HCT: 20.1 % — AB (ref 35.0–47.0)
HGB: 6.9 g/dL — ABNORMAL LOW (ref 12.0–16.0)
Lymphocyte #: 0.5 x10 3/mm — ABNORMAL LOW (ref 1.0–3.6)
Lymphocyte %: 15.4 %
MCH: 33 pg (ref 26.0–34.0)
MCHC: 34.4 g/dL (ref 32.0–36.0)
MCV: 96 fL (ref 80–100)
MONOS PCT: 10.6 %
Monocyte #: 0.4 x10 3/mm (ref 0.2–0.9)
Neutrophil #: 2.5 x10 3/mm (ref 1.4–6.5)
Neutrophil %: 72 %
PLATELETS: 28 x10 3/mm — AB (ref 150–440)
RBC: 2.1 10*6/uL — ABNORMAL LOW (ref 3.80–5.20)
RDW: 21 % — AB (ref 11.5–14.5)
WBC: 3.4 x10 3/mm — AB (ref 3.6–11.0)

## 2013-08-11 LAB — CBC CANCER CENTER
Basophil #: 0 "x10 3/mm "
Basophil %: 0.3 %
Eosinophil #: 0.1 "x10 3/mm "
Eosinophil %: 1.5 %
HCT: 26.5 % — ABNORMAL LOW
HGB: 9 g/dL — ABNORMAL LOW
Lymphocyte %: 12.3 %
Lymphs Abs: 0.4 "x10 3/mm " — ABNORMAL LOW
MCH: 31.4 pg
MCHC: 34.1 g/dL
MCV: 92 fL
Monocyte #: 0.5 "x10 3/mm "
Monocyte %: 13.2 %
Neutrophil #: 2.5 "x10 3/mm "
Neutrophil %: 72.7 %
Platelet: 18 "x10 3/mm " — CL
RBC: 2.88 "x10 6/mm " — ABNORMAL LOW
RDW: 19.6 % — ABNORMAL HIGH
WBC: 3.5 "x10 3/mm " — ABNORMAL LOW

## 2013-08-13 LAB — CBC CANCER CENTER
Basophil #: 0 x10 3/mm (ref 0.0–0.1)
Basophil %: 0.5 %
Eosinophil #: 0 x10 3/mm (ref 0.0–0.7)
Eosinophil %: 1.2 %
HCT: 24.7 % — ABNORMAL LOW (ref 35.0–47.0)
HGB: 8.4 g/dL — AB (ref 12.0–16.0)
LYMPHS ABS: 0.4 x10 3/mm — AB (ref 1.0–3.6)
LYMPHS PCT: 11.7 %
MCH: 31.8 pg (ref 26.0–34.0)
MCHC: 34.2 g/dL (ref 32.0–36.0)
MCV: 93 fL (ref 80–100)
MONO ABS: 0.5 x10 3/mm (ref 0.2–0.9)
Monocyte %: 14.1 %
NEUTROS ABS: 2.8 x10 3/mm (ref 1.4–6.5)
NEUTROS PCT: 72.5 %
Platelet: 17 x10 3/mm — CL (ref 150–440)
RBC: 2.65 10*6/uL — ABNORMAL LOW (ref 3.80–5.20)
RDW: 18.7 % — AB (ref 11.5–14.5)
WBC: 3.8 x10 3/mm (ref 3.6–11.0)

## 2013-08-18 LAB — COMPREHENSIVE METABOLIC PANEL
ANION GAP: 6 — AB (ref 7–16)
Albumin: 3.3 g/dL — ABNORMAL LOW (ref 3.4–5.0)
Alkaline Phosphatase: 100 U/L
BUN: 24 mg/dL — ABNORMAL HIGH (ref 7–18)
Bilirubin,Total: 0.2 mg/dL (ref 0.2–1.0)
CALCIUM: 8.4 mg/dL — AB (ref 8.5–10.1)
Chloride: 107 mmol/L (ref 98–107)
Co2: 28 mmol/L (ref 21–32)
Creatinine: 1.09 mg/dL (ref 0.60–1.30)
EGFR (Non-African Amer.): 52 — ABNORMAL LOW
GFR CALC AF AMER: 60 — AB
GLUCOSE: 103 mg/dL — AB (ref 65–99)
OSMOLALITY: 286 (ref 275–301)
POTASSIUM: 3.2 mmol/L — AB (ref 3.5–5.1)
SGOT(AST): 15 U/L (ref 15–37)
SGPT (ALT): 23 U/L (ref 12–78)
SODIUM: 141 mmol/L (ref 136–145)
Total Protein: 6.3 g/dL — ABNORMAL LOW (ref 6.4–8.2)

## 2013-08-18 LAB — CBC CANCER CENTER
BASOS ABS: 0 x10 3/mm (ref 0.0–0.1)
Basophil %: 1.1 %
EOS PCT: 0.4 %
Eosinophil #: 0 x10 3/mm (ref 0.0–0.7)
HCT: 26 % — AB (ref 35.0–47.0)
HGB: 8.6 g/dL — AB (ref 12.0–16.0)
Lymphocyte #: 0.5 x10 3/mm — ABNORMAL LOW (ref 1.0–3.6)
Lymphocyte %: 14.3 %
MCH: 32.3 pg (ref 26.0–34.0)
MCHC: 33.2 g/dL (ref 32.0–36.0)
MCV: 97 fL (ref 80–100)
MONO ABS: 0.5 x10 3/mm (ref 0.2–0.9)
MONOS PCT: 14.4 %
NEUTROS ABS: 2.6 x10 3/mm (ref 1.4–6.5)
Neutrophil %: 69.8 %
Platelet: 75 x10 3/mm — ABNORMAL LOW (ref 150–440)
RBC: 2.66 10*6/uL — ABNORMAL LOW (ref 3.80–5.20)
RDW: 19.2 % — AB (ref 11.5–14.5)
WBC: 3.7 x10 3/mm (ref 3.6–11.0)

## 2013-08-18 LAB — MAGNESIUM: MAGNESIUM: 1.8 mg/dL

## 2013-08-22 ENCOUNTER — Ambulatory Visit: Payer: Self-pay | Admitting: Oncology

## 2013-08-24 ENCOUNTER — Ambulatory Visit: Payer: Self-pay | Admitting: Oncology

## 2013-09-02 DIAGNOSIS — E876 Hypokalemia: Secondary | ICD-10-CM | POA: Insufficient documentation

## 2013-09-14 LAB — CBC CANCER CENTER
Basophil #: 0 x10 3/mm (ref 0.0–0.1)
Basophil %: 0.2 %
Eosinophil #: 0.8 x10 3/mm — ABNORMAL HIGH (ref 0.0–0.7)
Eosinophil %: 11.7 %
HCT: 33.6 % — AB (ref 35.0–47.0)
HGB: 11.3 g/dL — ABNORMAL LOW (ref 12.0–16.0)
Lymphocyte #: 1.2 x10 3/mm (ref 1.0–3.6)
Lymphocyte %: 19 %
MCH: 34.6 pg — ABNORMAL HIGH (ref 26.0–34.0)
MCHC: 33.6 g/dL (ref 32.0–36.0)
MCV: 103 fL — AB (ref 80–100)
MONOS PCT: 13.7 %
Monocyte #: 0.9 x10 3/mm (ref 0.2–0.9)
NEUTROS PCT: 55.4 %
Neutrophil #: 3.6 x10 3/mm (ref 1.4–6.5)
PLATELETS: 158 x10 3/mm (ref 150–440)
RBC: 3.26 10*6/uL — ABNORMAL LOW (ref 3.80–5.20)
RDW: 22.7 % — AB (ref 11.5–14.5)
WBC: 6.4 x10 3/mm (ref 3.6–11.0)

## 2013-09-14 LAB — COMPREHENSIVE METABOLIC PANEL
ALK PHOS: 96 U/L
ANION GAP: 8 (ref 7–16)
Albumin: 3.6 g/dL (ref 3.4–5.0)
BUN: 22 mg/dL — ABNORMAL HIGH (ref 7–18)
Bilirubin,Total: 0.3 mg/dL (ref 0.2–1.0)
CO2: 28 mmol/L (ref 21–32)
CREATININE: 1.24 mg/dL (ref 0.60–1.30)
Calcium, Total: 8.7 mg/dL (ref 8.5–10.1)
Chloride: 106 mmol/L (ref 98–107)
EGFR (African American): 51 — ABNORMAL LOW
GFR CALC NON AF AMER: 44 — AB
GLUCOSE: 118 mg/dL — AB (ref 65–99)
Osmolality: 288 (ref 275–301)
POTASSIUM: 3.8 mmol/L (ref 3.5–5.1)
SGOT(AST): 21 U/L (ref 15–37)
SGPT (ALT): 35 U/L (ref 12–78)
SODIUM: 142 mmol/L (ref 136–145)
TOTAL PROTEIN: 6.5 g/dL (ref 6.4–8.2)

## 2013-09-19 ENCOUNTER — Ambulatory Visit: Payer: Self-pay | Admitting: Oncology

## 2013-10-12 LAB — CBC CANCER CENTER
Basophil #: 0 x10 3/mm (ref 0.0–0.1)
Basophil %: 1.5 %
EOS ABS: 0.6 x10 3/mm (ref 0.0–0.7)
Eosinophil %: 20.7 %
HCT: 34.7 % — AB (ref 35.0–47.0)
HGB: 11.5 g/dL — AB (ref 12.0–16.0)
LYMPHS PCT: 25.6 %
Lymphocyte #: 0.7 x10 3/mm — ABNORMAL LOW (ref 1.0–3.6)
MCH: 35.3 pg — ABNORMAL HIGH (ref 26.0–34.0)
MCHC: 33.1 g/dL (ref 32.0–36.0)
MCV: 107 fL — ABNORMAL HIGH (ref 80–100)
MONO ABS: 0.4 x10 3/mm (ref 0.2–0.9)
Monocyte %: 15.8 %
NEUTROS ABS: 1 x10 3/mm — AB (ref 1.4–6.5)
Neutrophil %: 36.4 %
PLATELETS: 107 x10 3/mm — AB (ref 150–440)
RBC: 3.26 10*6/uL — ABNORMAL LOW (ref 3.80–5.20)
RDW: 17.1 % — AB (ref 11.5–14.5)
WBC: 2.8 x10 3/mm — ABNORMAL LOW (ref 3.6–11.0)

## 2013-10-12 LAB — COMPREHENSIVE METABOLIC PANEL
ANION GAP: 9 (ref 7–16)
Albumin: 3.7 g/dL (ref 3.4–5.0)
Alkaline Phosphatase: 102 U/L
BILIRUBIN TOTAL: 0.4 mg/dL (ref 0.2–1.0)
BUN: 24 mg/dL — AB (ref 7–18)
CO2: 29 mmol/L (ref 21–32)
CREATININE: 0.96 mg/dL (ref 0.60–1.30)
Calcium, Total: 9.4 mg/dL (ref 8.5–10.1)
Chloride: 104 mmol/L (ref 98–107)
EGFR (African American): >60
EGFR (Non-African Amer.): >60
Glucose: 107 mg/dL — ABNORMAL HIGH (ref 65–99)
OSMOLALITY: 288 (ref 275–301)
Potassium: 3.5 mmol/L (ref 3.5–5.1)
SGOT(AST): 14 U/L — ABNORMAL LOW (ref 15–37)
SGPT (ALT): 19 U/L (ref 12–78)
Sodium: 142 mmol/L (ref 136–145)
Total Protein: 6.8 g/dL (ref 6.4–8.2)

## 2013-10-20 ENCOUNTER — Ambulatory Visit: Payer: Self-pay | Admitting: Oncology

## 2013-11-08 DIAGNOSIS — D709 Neutropenia, unspecified: Secondary | ICD-10-CM | POA: Insufficient documentation

## 2013-11-08 DIAGNOSIS — R5081 Fever presenting with conditions classified elsewhere: Secondary | ICD-10-CM

## 2013-11-25 ENCOUNTER — Ambulatory Visit: Payer: Self-pay | Admitting: Oncology

## 2013-12-20 ENCOUNTER — Ambulatory Visit: Payer: Self-pay | Admitting: Oncology

## 2013-12-22 LAB — COMPREHENSIVE METABOLIC PANEL
ALBUMIN: 3.5 g/dL (ref 3.4–5.0)
Alkaline Phosphatase: 113 U/L
Anion Gap: 8 (ref 7–16)
BUN: 26 mg/dL — ABNORMAL HIGH (ref 7–18)
Bilirubin,Total: 0.4 mg/dL (ref 0.2–1.0)
CHLORIDE: 104 mmol/L (ref 98–107)
Calcium, Total: 10 mg/dL (ref 8.5–10.1)
Co2: 29 mmol/L (ref 21–32)
Creatinine: 1.32 mg/dL — ABNORMAL HIGH (ref 0.60–1.30)
GFR CALC AF AMER: 47 — AB
GFR CALC NON AF AMER: 41 — AB
Glucose: 90 mg/dL (ref 65–99)
OSMOLALITY: 286 (ref 275–301)
Potassium: 3.9 mmol/L (ref 3.5–5.1)
SGOT(AST): 12 U/L — ABNORMAL LOW (ref 15–37)
SGPT (ALT): 17 U/L (ref 12–78)
Sodium: 141 mmol/L (ref 136–145)
Total Protein: 7.2 g/dL (ref 6.4–8.2)

## 2013-12-22 LAB — CBC CANCER CENTER
Basophil #: 0.1 x10 3/mm (ref 0.0–0.1)
Basophil %: 1.3 %
EOS ABS: 2.1 x10 3/mm — AB (ref 0.0–0.7)
EOS PCT: 34 %
HCT: 36.5 % (ref 35.0–47.0)
HGB: 12.5 g/dL (ref 12.0–16.0)
LYMPHS PCT: 15.5 %
Lymphocyte #: 0.9 x10 3/mm — ABNORMAL LOW (ref 1.0–3.6)
MCH: 36.6 pg — ABNORMAL HIGH (ref 26.0–34.0)
MCHC: 34.1 g/dL (ref 32.0–36.0)
MCV: 107 fL — AB (ref 80–100)
Monocyte #: 0.8 x10 3/mm (ref 0.2–0.9)
Monocyte %: 12.9 %
NEUTROS PCT: 36.3 %
Neutrophil #: 2.2 x10 3/mm (ref 1.4–6.5)
Platelet: 180 x10 3/mm (ref 150–440)
RBC: 3.41 10*6/uL — ABNORMAL LOW (ref 3.80–5.20)
RDW: 13.8 % (ref 11.5–14.5)
WBC: 6.1 x10 3/mm (ref 3.6–11.0)

## 2013-12-28 LAB — CBC CANCER CENTER
Basophil #: 0.1 x10 3/mm (ref 0.0–0.1)
Basophil %: 1.3 %
EOS PCT: 11.8 %
Eosinophil #: 0.8 x10 3/mm — ABNORMAL HIGH (ref 0.0–0.7)
HCT: 35 % (ref 35.0–47.0)
HGB: 11.9 g/dL — ABNORMAL LOW (ref 12.0–16.0)
Lymphocyte #: 0.8 x10 3/mm — ABNORMAL LOW (ref 1.0–3.6)
Lymphocyte %: 12.6 %
MCH: 36.2 pg — ABNORMAL HIGH (ref 26.0–34.0)
MCHC: 33.9 g/dL (ref 32.0–36.0)
MCV: 107 fL — ABNORMAL HIGH (ref 80–100)
MONO ABS: 0.9 x10 3/mm (ref 0.2–0.9)
Monocyte %: 13.2 %
Neutrophil #: 4.1 x10 3/mm (ref 1.4–6.5)
Neutrophil %: 61.1 %
Platelet: 166 x10 3/mm (ref 150–440)
RBC: 3.28 10*6/uL — ABNORMAL LOW (ref 3.80–5.20)
RDW: 13.8 % (ref 11.5–14.5)
WBC: 6.6 x10 3/mm (ref 3.6–11.0)

## 2013-12-28 LAB — COMPREHENSIVE METABOLIC PANEL
ALBUMIN: 3.4 g/dL (ref 3.4–5.0)
Alkaline Phosphatase: 104 U/L
Anion Gap: 8 (ref 7–16)
BILIRUBIN TOTAL: 0.4 mg/dL (ref 0.2–1.0)
BUN: 40 mg/dL — ABNORMAL HIGH (ref 7–18)
CALCIUM: 9.4 mg/dL (ref 8.5–10.1)
CO2: 30 mmol/L (ref 21–32)
CREATININE: 1.28 mg/dL (ref 0.60–1.30)
Chloride: 105 mmol/L (ref 98–107)
EGFR (Non-African Amer.): 42 — ABNORMAL LOW
GFR CALC AF AMER: 49 — AB
GLUCOSE: 81 mg/dL (ref 65–99)
OSMOLALITY: 294 (ref 275–301)
POTASSIUM: 3 mmol/L — AB (ref 3.5–5.1)
SGOT(AST): 15 U/L (ref 15–37)
SGPT (ALT): 31 U/L (ref 12–78)
SODIUM: 143 mmol/L (ref 136–145)
Total Protein: 6.8 g/dL (ref 6.4–8.2)

## 2014-01-11 LAB — CBC CANCER CENTER
BASOS ABS: 0.1 x10 3/mm (ref 0.0–0.1)
Basophil %: 1.3 %
EOS ABS: 1.4 x10 3/mm — AB (ref 0.0–0.7)
Eosinophil %: 26.8 %
HCT: 35.4 % (ref 35.0–47.0)
HGB: 11.9 g/dL — ABNORMAL LOW (ref 12.0–16.0)
Lymphocyte #: 0.9 x10 3/mm — ABNORMAL LOW (ref 1.0–3.6)
Lymphocyte %: 17.8 %
MCH: 36.1 pg — ABNORMAL HIGH (ref 26.0–34.0)
MCHC: 33.8 g/dL (ref 32.0–36.0)
MCV: 107 fL — ABNORMAL HIGH (ref 80–100)
MONO ABS: 0.7 x10 3/mm (ref 0.2–0.9)
Monocyte %: 13.7 %
Neutrophil #: 2.1 x10 3/mm (ref 1.4–6.5)
Neutrophil %: 40.4 %
Platelet: 127 x10 3/mm — ABNORMAL LOW (ref 150–440)
RBC: 3.31 10*6/uL — ABNORMAL LOW (ref 3.80–5.20)
RDW: 13.6 % (ref 11.5–14.5)
WBC: 5.2 x10 3/mm (ref 3.6–11.0)

## 2014-01-11 LAB — COMPREHENSIVE METABOLIC PANEL
ALT: 17 U/L (ref 12–78)
ANION GAP: 4 — AB (ref 7–16)
AST: 11 U/L — AB (ref 15–37)
Albumin: 3.3 g/dL — ABNORMAL LOW (ref 3.4–5.0)
Alkaline Phosphatase: 92 U/L
BILIRUBIN TOTAL: 0.6 mg/dL (ref 0.2–1.0)
BUN: 20 mg/dL — ABNORMAL HIGH (ref 7–18)
CALCIUM: 9.7 mg/dL (ref 8.5–10.1)
Chloride: 104 mmol/L (ref 98–107)
Co2: 32 mmol/L (ref 21–32)
Creatinine: 1.23 mg/dL (ref 0.60–1.30)
EGFR (Non-African Amer.): 44 — ABNORMAL LOW
GFR CALC AF AMER: 51 — AB
Glucose: 90 mg/dL (ref 65–99)
OSMOLALITY: 282 (ref 275–301)
Potassium: 3.7 mmol/L (ref 3.5–5.1)
SODIUM: 140 mmol/L (ref 136–145)
Total Protein: 6.8 g/dL (ref 6.4–8.2)

## 2014-01-18 ENCOUNTER — Ambulatory Visit: Payer: Self-pay | Admitting: Oncology

## 2014-01-18 LAB — CBC CANCER CENTER
BASOS PCT: 1.2 %
Basophil #: 0 x10 3/mm (ref 0.0–0.1)
Eosinophil #: 0.8 x10 3/mm — ABNORMAL HIGH (ref 0.0–0.7)
Eosinophil %: 28.9 %
HCT: 34.3 % — ABNORMAL LOW (ref 35.0–47.0)
HGB: 11.7 g/dL — ABNORMAL LOW (ref 12.0–16.0)
Lymphocyte #: 0.6 x10 3/mm — ABNORMAL LOW (ref 1.0–3.6)
Lymphocyte %: 21.7 %
MCH: 36.3 pg — ABNORMAL HIGH (ref 26.0–34.0)
MCHC: 33.9 g/dL (ref 32.0–36.0)
MCV: 107 fL — ABNORMAL HIGH (ref 80–100)
MONO ABS: 0.3 x10 3/mm (ref 0.2–0.9)
Monocyte %: 9.3 %
NEUTROS ABS: 1.1 x10 3/mm — AB (ref 1.4–6.5)
Neutrophil %: 38.9 %
Platelet: 134 x10 3/mm — ABNORMAL LOW (ref 150–440)
RBC: 3.21 10*6/uL — AB (ref 3.80–5.20)
RDW: 13.6 % (ref 11.5–14.5)
WBC: 2.9 x10 3/mm — AB (ref 3.6–11.0)

## 2014-01-18 LAB — COMPREHENSIVE METABOLIC PANEL
ALK PHOS: 90 U/L
ANION GAP: 5 — AB (ref 7–16)
AST: 18 U/L (ref 15–37)
Albumin: 3.3 g/dL — ABNORMAL LOW (ref 3.4–5.0)
BUN: 25 mg/dL — ABNORMAL HIGH (ref 7–18)
Bilirubin,Total: 0.4 mg/dL (ref 0.2–1.0)
CALCIUM: 9.3 mg/dL (ref 8.5–10.1)
Chloride: 106 mmol/L (ref 98–107)
Co2: 33 mmol/L — ABNORMAL HIGH (ref 21–32)
Creatinine: 1.24 mg/dL (ref 0.60–1.30)
EGFR (African American): 51 — ABNORMAL LOW
GFR CALC NON AF AMER: 44 — AB
Glucose: 153 mg/dL — ABNORMAL HIGH (ref 65–99)
Osmolality: 294 (ref 275–301)
POTASSIUM: 3.3 mmol/L — AB (ref 3.5–5.1)
SGPT (ALT): 26 U/L (ref 12–78)
SODIUM: 144 mmol/L (ref 136–145)
Total Protein: 6.6 g/dL (ref 6.4–8.2)

## 2014-01-18 LAB — LACTATE DEHYDROGENASE: LDH: 222 U/L (ref 81–246)

## 2014-01-19 ENCOUNTER — Ambulatory Visit: Payer: Self-pay | Admitting: Oncology

## 2014-02-16 LAB — COMPREHENSIVE METABOLIC PANEL
ANION GAP: 6 — AB (ref 7–16)
Albumin: 3.5 g/dL (ref 3.4–5.0)
Alkaline Phosphatase: 96 U/L
BILIRUBIN TOTAL: 0.3 mg/dL (ref 0.2–1.0)
BUN: 25 mg/dL — ABNORMAL HIGH (ref 7–18)
CO2: 29 mmol/L (ref 21–32)
CREATININE: 1.57 mg/dL — AB (ref 0.60–1.30)
Calcium, Total: 9.4 mg/dL (ref 8.5–10.1)
Chloride: 103 mmol/L (ref 98–107)
EGFR (African American): 38 — ABNORMAL LOW
EGFR (Non-African Amer.): 33 — ABNORMAL LOW
GLUCOSE: 88 mg/dL (ref 65–99)
OSMOLALITY: 279 (ref 275–301)
Potassium: 3.8 mmol/L (ref 3.5–5.1)
SGOT(AST): 22 U/L (ref 15–37)
SGPT (ALT): 18 U/L
SODIUM: 138 mmol/L (ref 136–145)
Total Protein: 7.2 g/dL (ref 6.4–8.2)

## 2014-02-16 LAB — CBC CANCER CENTER
BASOS ABS: 0.1 x10 3/mm (ref 0.0–0.1)
Basophil %: 1.5 %
EOS ABS: 0.7 x10 3/mm (ref 0.0–0.7)
EOS PCT: 20.9 %
HCT: 37.1 % (ref 35.0–47.0)
HGB: 12.7 g/dL (ref 12.0–16.0)
LYMPHS ABS: 0.8 x10 3/mm — AB (ref 1.0–3.6)
Lymphocyte %: 23.1 %
MCH: 36.2 pg — ABNORMAL HIGH (ref 26.0–34.0)
MCHC: 34.2 g/dL (ref 32.0–36.0)
MCV: 106 fL — ABNORMAL HIGH (ref 80–100)
MONO ABS: 0.5 x10 3/mm (ref 0.2–0.9)
MONOS PCT: 13.7 %
NEUTROS ABS: 1.4 x10 3/mm (ref 1.4–6.5)
NEUTROS PCT: 40.8 %
Platelet: 156 x10 3/mm (ref 150–440)
RBC: 3.51 10*6/uL — AB (ref 3.80–5.20)
RDW: 14 % (ref 11.5–14.5)
WBC: 3.5 x10 3/mm — ABNORMAL LOW (ref 3.6–11.0)

## 2014-02-19 ENCOUNTER — Ambulatory Visit: Payer: Self-pay | Admitting: Oncology

## 2014-03-01 ENCOUNTER — Encounter: Payer: Self-pay | Admitting: Oncology

## 2014-03-21 LAB — CBC CANCER CENTER
BASOS ABS: 1 %
EOS PCT: 38 %
HCT: 34.4 % — AB (ref 35.0–47.0)
HGB: 11.8 g/dL — ABNORMAL LOW (ref 12.0–16.0)
Lymphocytes: 14 %
MCH: 36 pg — ABNORMAL HIGH (ref 26.0–34.0)
MCHC: 34.2 g/dL (ref 32.0–36.0)
MCV: 105 fL — ABNORMAL HIGH (ref 80–100)
Monocytes: 14 %
Platelet: 159 x10 3/mm (ref 150–440)
RBC: 3.26 10*6/uL — AB (ref 3.80–5.20)
RDW: 13.5 % (ref 11.5–14.5)
SEGMENTED NEUTROPHILS: 33 %
WBC: 5.3 x10 3/mm (ref 3.6–11.0)

## 2014-03-21 LAB — COMPREHENSIVE METABOLIC PANEL
AST: 16 U/L (ref 15–37)
Albumin: 3.4 g/dL (ref 3.4–5.0)
Alkaline Phosphatase: 96 U/L
Anion Gap: 6 — ABNORMAL LOW (ref 7–16)
BUN: 19 mg/dL — AB (ref 7–18)
Bilirubin,Total: 0.4 mg/dL (ref 0.2–1.0)
Calcium, Total: 9.7 mg/dL (ref 8.5–10.1)
Chloride: 104 mmol/L (ref 98–107)
Co2: 27 mmol/L (ref 21–32)
Creatinine: 1.18 mg/dL (ref 0.60–1.30)
EGFR (African American): 54 — ABNORMAL LOW
GFR CALC NON AF AMER: 47 — AB
Glucose: 98 mg/dL (ref 65–99)
OSMOLALITY: 276 (ref 275–301)
POTASSIUM: 3.8 mmol/L (ref 3.5–5.1)
SGPT (ALT): 15 U/L
Sodium: 137 mmol/L (ref 136–145)
TOTAL PROTEIN: 7.3 g/dL (ref 6.4–8.2)

## 2014-03-22 ENCOUNTER — Ambulatory Visit: Payer: Self-pay | Admitting: Oncology

## 2014-03-22 ENCOUNTER — Encounter: Payer: Self-pay | Admitting: Oncology

## 2014-03-29 LAB — CBC CANCER CENTER
BASOS PCT: 2.2 %
Basophil #: 0.1 x10 3/mm (ref 0.0–0.1)
Eosinophil #: 0.8 x10 3/mm — ABNORMAL HIGH (ref 0.0–0.7)
Eosinophil %: 21.7 %
HCT: 32.1 % — ABNORMAL LOW (ref 35.0–47.0)
HGB: 10.7 g/dL — ABNORMAL LOW (ref 12.0–16.0)
LYMPHS PCT: 17 %
Lymphocyte #: 0.6 x10 3/mm — ABNORMAL LOW (ref 1.0–3.6)
MCH: 35.1 pg — AB (ref 26.0–34.0)
MCHC: 33.5 g/dL (ref 32.0–36.0)
MCV: 105 fL — AB (ref 80–100)
Monocyte #: 0.5 x10 3/mm (ref 0.2–0.9)
Monocyte %: 14.7 %
Neutrophil #: 1.6 x10 3/mm (ref 1.4–6.5)
Neutrophil %: 44.4 %
Platelet: 174 x10 3/mm (ref 150–440)
RBC: 3.07 10*6/uL — AB (ref 3.80–5.20)
RDW: 13.3 % (ref 11.5–14.5)
WBC: 3.5 x10 3/mm — ABNORMAL LOW (ref 3.6–11.0)

## 2014-03-29 LAB — COMPREHENSIVE METABOLIC PANEL
ANION GAP: 7 (ref 7–16)
Albumin: 3.2 g/dL — ABNORMAL LOW (ref 3.4–5.0)
Alkaline Phosphatase: 87 U/L
BUN: 24 mg/dL — ABNORMAL HIGH (ref 7–18)
Bilirubin,Total: 0.3 mg/dL (ref 0.2–1.0)
Calcium, Total: 9.2 mg/dL (ref 8.5–10.1)
Chloride: 103 mmol/L (ref 98–107)
Co2: 30 mmol/L (ref 21–32)
Creatinine: 1.32 mg/dL — ABNORMAL HIGH (ref 0.60–1.30)
EGFR (African American): 47 — ABNORMAL LOW
EGFR (Non-African Amer.): 41 — ABNORMAL LOW
GLUCOSE: 116 mg/dL — AB (ref 65–99)
OSMOLALITY: 284 (ref 275–301)
POTASSIUM: 2.7 mmol/L — AB (ref 3.5–5.1)
SGOT(AST): 20 U/L (ref 15–37)
SGPT (ALT): 17 U/L
Sodium: 140 mmol/L (ref 136–145)
TOTAL PROTEIN: 6.9 g/dL (ref 6.4–8.2)

## 2014-04-18 LAB — COMPREHENSIVE METABOLIC PANEL
ALT: 17 U/L
AST: 16 U/L (ref 15–37)
Albumin: 3.4 g/dL (ref 3.4–5.0)
Alkaline Phosphatase: 95 U/L
Anion Gap: 7 (ref 7–16)
BUN: 21 mg/dL — ABNORMAL HIGH (ref 7–18)
Bilirubin,Total: 0.3 mg/dL (ref 0.2–1.0)
CO2: 29 mmol/L (ref 21–32)
CREATININE: 1.09 mg/dL (ref 0.60–1.30)
Calcium, Total: 9.5 mg/dL (ref 8.5–10.1)
Chloride: 102 mmol/L (ref 98–107)
EGFR (African American): >60
GFR CALC NON AF AMER: 53 — AB
Glucose: 82 mg/dL (ref 65–99)
OSMOLALITY: 278 (ref 275–301)
POTASSIUM: 3.7 mmol/L (ref 3.5–5.1)
SODIUM: 138 mmol/L (ref 136–145)
Total Protein: 6.5 g/dL (ref 6.4–8.2)

## 2014-04-18 LAB — CBC CANCER CENTER
BASOS ABS: 0.1 x10 3/mm (ref 0.0–0.1)
Basophil %: 1.1 %
EOS ABS: 1.3 x10 3/mm — AB (ref 0.0–0.7)
Eosinophil %: 20 %
HCT: 33.3 % — ABNORMAL LOW (ref 35.0–47.0)
HGB: 11 g/dL — AB (ref 12.0–16.0)
LYMPHS ABS: 0.9 x10 3/mm — AB (ref 1.0–3.6)
LYMPHS PCT: 13.5 %
MCH: 34.6 pg — ABNORMAL HIGH (ref 26.0–34.0)
MCHC: 32.9 g/dL (ref 32.0–36.0)
MCV: 105 fL — AB (ref 80–100)
MONOS PCT: 11.2 %
Monocyte #: 0.7 x10 3/mm (ref 0.2–0.9)
Neutrophil #: 3.6 x10 3/mm (ref 1.4–6.5)
Neutrophil %: 54.2 %
PLATELETS: 167 x10 3/mm (ref 150–440)
RBC: 3.17 10*6/uL — AB (ref 3.80–5.20)
RDW: 14 % (ref 11.5–14.5)
WBC: 6.6 x10 3/mm (ref 3.6–11.0)

## 2014-04-18 LAB — LACTATE DEHYDROGENASE: LDH: 158 U/L (ref 81–246)

## 2014-04-21 ENCOUNTER — Ambulatory Visit: Payer: Self-pay | Admitting: Oncology

## 2014-04-21 ENCOUNTER — Encounter: Payer: Self-pay | Admitting: Oncology

## 2014-05-22 ENCOUNTER — Ambulatory Visit: Payer: Self-pay | Admitting: Oncology

## 2014-05-22 ENCOUNTER — Encounter: Payer: Self-pay | Admitting: Oncology

## 2014-06-01 LAB — COMPREHENSIVE METABOLIC PANEL
ALBUMIN: 3.5 g/dL (ref 3.4–5.0)
Alkaline Phosphatase: 97 U/L
Anion Gap: 3 — ABNORMAL LOW (ref 7–16)
BUN: 16 mg/dL (ref 7–18)
Bilirubin,Total: 0.4 mg/dL (ref 0.2–1.0)
Calcium, Total: 8.8 mg/dL (ref 8.5–10.1)
Chloride: 105 mmol/L (ref 98–107)
Co2: 32 mmol/L (ref 21–32)
Creatinine: 1.17 mg/dL (ref 0.60–1.30)
EGFR (African American): 59 — ABNORMAL LOW
GFR CALC NON AF AMER: 49 — AB
GLUCOSE: 105 mg/dL — AB (ref 65–99)
OSMOLALITY: 281 (ref 275–301)
Potassium: 3.8 mmol/L (ref 3.5–5.1)
SGOT(AST): 18 U/L (ref 15–37)
SGPT (ALT): 16 U/L
Sodium: 140 mmol/L (ref 136–145)
TOTAL PROTEIN: 6.9 g/dL (ref 6.4–8.2)

## 2014-06-01 LAB — CBC CANCER CENTER
Basophil #: 0 x10 3/mm (ref 0.0–0.1)
Basophil %: 0.6 %
Eosinophil #: 0.2 x10 3/mm (ref 0.0–0.7)
Eosinophil %: 3 %
HCT: 37.2 % (ref 35.0–47.0)
HGB: 12.3 g/dL (ref 12.0–16.0)
Lymphocyte #: 0.4 x10 3/mm — ABNORMAL LOW (ref 1.0–3.6)
Lymphocyte %: 6.9 %
MCH: 34.9 pg — ABNORMAL HIGH (ref 26.0–34.0)
MCHC: 32.9 g/dL (ref 32.0–36.0)
MCV: 106 fL — ABNORMAL HIGH (ref 80–100)
Monocyte #: 0.5 x10 3/mm (ref 0.2–0.9)
Monocyte %: 8.5 %
Neutrophil #: 4.9 x10 3/mm (ref 1.4–6.5)
Neutrophil %: 81 %
Platelet: 151 x10 3/mm (ref 150–440)
RBC: 3.52 10*6/uL — ABNORMAL LOW (ref 3.80–5.20)
RDW: 15 % — ABNORMAL HIGH (ref 11.5–14.5)
WBC: 6.1 x10 3/mm (ref 3.6–11.0)

## 2014-06-14 LAB — CBC CANCER CENTER
BASOS ABS: 0.1 x10 3/mm (ref 0.0–0.1)
BASOS PCT: 0.9 %
EOS ABS: 0.3 x10 3/mm (ref 0.0–0.7)
EOS PCT: 3.7 %
HCT: 38.4 % (ref 35.0–47.0)
HGB: 12.7 g/dL (ref 12.0–16.0)
Lymphocyte #: 1.2 x10 3/mm (ref 1.0–3.6)
Lymphocyte %: 14.7 %
MCH: 34.9 pg — ABNORMAL HIGH (ref 26.0–34.0)
MCHC: 33.1 g/dL (ref 32.0–36.0)
MCV: 106 fL — AB (ref 80–100)
Monocyte #: 0.7 x10 3/mm (ref 0.2–0.9)
Monocyte %: 9.3 %
NEUTROS ABS: 5.7 x10 3/mm (ref 1.4–6.5)
NEUTROS PCT: 71.4 %
PLATELETS: 152 x10 3/mm (ref 150–440)
RBC: 3.64 10*6/uL — AB (ref 3.80–5.20)
RDW: 14.9 % — ABNORMAL HIGH (ref 11.5–14.5)
WBC: 8 x10 3/mm (ref 3.6–11.0)

## 2014-06-14 LAB — BASIC METABOLIC PANEL
Anion Gap: 9 (ref 7–16)
BUN: 20 mg/dL — ABNORMAL HIGH (ref 7–18)
CALCIUM: 9.7 mg/dL (ref 8.5–10.1)
Chloride: 102 mmol/L (ref 98–107)
Co2: 29 mmol/L (ref 21–32)
Creatinine: 1.17 mg/dL (ref 0.60–1.30)
GFR CALC AF AMER: 59 — AB
GFR CALC NON AF AMER: 49 — AB
Glucose: 100 mg/dL — ABNORMAL HIGH (ref 65–99)
Osmolality: 282 (ref 275–301)
Potassium: 3.1 mmol/L — ABNORMAL LOW (ref 3.5–5.1)
Sodium: 140 mmol/L (ref 136–145)

## 2014-06-21 ENCOUNTER — Ambulatory Visit: Payer: Self-pay | Admitting: Oncology

## 2014-06-21 ENCOUNTER — Encounter: Payer: Self-pay | Admitting: Oncology

## 2014-06-29 LAB — CBC CANCER CENTER
BASOS ABS: 0.1 x10 3/mm (ref 0.0–0.1)
Basophil %: 1 %
Eosinophil #: 0.4 x10 3/mm (ref 0.0–0.7)
Eosinophil %: 8.4 %
HCT: 34.7 % — AB (ref 35.0–47.0)
HGB: 11.4 g/dL — ABNORMAL LOW (ref 12.0–16.0)
LYMPHS PCT: 22.8 %
Lymphocyte #: 1.1 x10 3/mm (ref 1.0–3.6)
MCH: 34.4 pg — AB (ref 26.0–34.0)
MCHC: 32.8 g/dL (ref 32.0–36.0)
MCV: 105 fL — ABNORMAL HIGH (ref 80–100)
MONO ABS: 0.6 x10 3/mm (ref 0.2–0.9)
Monocyte %: 13.1 %
NEUTROS ABS: 2.7 x10 3/mm (ref 1.4–6.5)
NEUTROS PCT: 54.7 %
Platelet: 171 x10 3/mm (ref 150–440)
RBC: 3.3 10*6/uL — AB (ref 3.80–5.20)
RDW: 15.1 % — ABNORMAL HIGH (ref 11.5–14.5)
WBC: 4.9 x10 3/mm (ref 3.6–11.0)

## 2014-06-29 LAB — COMPREHENSIVE METABOLIC PANEL
ALBUMIN: 3.5 g/dL (ref 3.4–5.0)
ALK PHOS: 86 U/L
ANION GAP: 6 — AB (ref 7–16)
AST: 14 U/L — AB (ref 15–37)
BUN: 21 mg/dL — ABNORMAL HIGH (ref 7–18)
Bilirubin,Total: 0.2 mg/dL (ref 0.2–1.0)
CALCIUM: 9.1 mg/dL (ref 8.5–10.1)
CREATININE: 1.1 mg/dL (ref 0.60–1.30)
Chloride: 105 mmol/L (ref 98–107)
Co2: 30 mmol/L (ref 21–32)
EGFR (African American): >60
EGFR (Non-African Amer.): 52 — ABNORMAL LOW
GLUCOSE: 95 mg/dL (ref 65–99)
Osmolality: 284 (ref 275–301)
POTASSIUM: 3.6 mmol/L (ref 3.5–5.1)
SGPT (ALT): 20 U/L
Sodium: 141 mmol/L (ref 136–145)
Total Protein: 6.6 g/dL (ref 6.4–8.2)

## 2014-07-22 ENCOUNTER — Ambulatory Visit: Payer: Self-pay | Admitting: Oncology

## 2014-07-22 ENCOUNTER — Encounter: Payer: Self-pay | Admitting: Oncology

## 2014-08-22 ENCOUNTER — Encounter: Payer: Self-pay | Admitting: Oncology

## 2014-08-22 ENCOUNTER — Ambulatory Visit: Payer: Self-pay | Admitting: Oncology

## 2014-09-20 ENCOUNTER — Encounter: Payer: Self-pay | Admitting: Oncology

## 2014-09-28 ENCOUNTER — Ambulatory Visit
Admit: 2014-09-28 | Disposition: A | Discharge status: Home / Self Care | Payer: Self-pay | Attending: Oncology | Admitting: Oncology

## 2014-10-21 ENCOUNTER — Ambulatory Visit
Admit: 2014-10-21 | Disposition: A | Discharge status: Home / Self Care | Payer: Self-pay | Attending: Oncology | Admitting: Oncology

## 2014-10-29 ENCOUNTER — Emergency Department
Admit: 2014-10-29 | Disposition: A | Discharge status: Home / Self Care | Payer: Self-pay | Admitting: Emergency Medicine

## 2014-11-01 DIAGNOSIS — M25519 Pain in unspecified shoulder: Secondary | ICD-10-CM | POA: Insufficient documentation

## 2014-11-01 DIAGNOSIS — S42209A Unspecified fracture of upper end of unspecified humerus, initial encounter for closed fracture: Secondary | ICD-10-CM | POA: Insufficient documentation

## 2014-11-09 ENCOUNTER — Other Ambulatory Visit: Payer: Self-pay | Admitting: Oncology

## 2014-11-09 DIAGNOSIS — C8590 Non-Hodgkin lymphoma, unspecified, unspecified site: Secondary | ICD-10-CM

## 2014-11-11 NOTE — Op Note (Signed)
PATIENT NAME:  Anna Mcbride, Anna Mcbride MR#:  [id number] DATE OF BIRTH:  [birth year]  DATE OF PROCEDURE:  04/30/2013  PREOPERATIVE DIAGNOSIS: Lymphoma.   POSTOPERATIVE DIAGNOSIS: Lymphoma.   OPERATION: Insertion of venous access port.   SURGEON: S.G. Jamal Collin, M.D.   ANESTHESIA: Monitored anesthesia care with local anesthetic of 0.5% Marcaine mixed with 1% Xylocaine.   COMPLICATIONS: None.   ESTIMATED BLOOD LOSS: Minimal.   DRAINS: None.   ADDITIONAL GUIDANCE: Ultrasound and fluoroscopy.   DESCRIPTION OF PROCEDURE: The patient was placed in the supine position on the operating table and then in slight Trendelenburg. With adequate sedation monitoring, the left upper chest and neck area were prepped and draped out as a sterile field. Timeout was performed. The ultrasound probe was brought to the field. The subclavian vein was identified beneath the lateral end of the clavicle. Local anesthetic was instilled and a small stab incision was made through which a needle was successfully positioned in the subclavian vein with ultrasound guidance. There was free withdrawal of blood. Guidewire was then passed followed by the introducer dilator using the Seldinger technique. The catheter was then subsequently positioned going into the area of the superior vena cava and right atrium and the outer sheath was removed. A subcutaneous pocket was created over the second costal space. After infiltration of local anesthetic, an incision was made and a subcutaneous pocket was then created. The catheter was tunneled through to the site, cut to approximate length and attached to a prefilled port. 2-0 Prolene stitches and were placed to anchor the port to the underlying fascia. Three stitches were used. The catheter was smoothed out at its entrance site and the port was flushed through with heparinized saline. Repeat fluoroscopy showed good positioning of the catheter in the distal SVC right atrial region. The wound was irrigated and  closed with 3-0 Vicryl in the subcutaneous tissue and the skin with subcuticular 4-0 Vicryl covered with Dermabond. The procedure was well tolerated. She was subsequently returned to the recovery room in stable condition.   ____________________________ S.Robinette Haines, MD sgs:aw D: 05/03/2013 12:43:56 ET T: 05/03/2013 12:51:55 ET JOB#: [id number]  cc: S.G. Jamal Collin, MD, <Dictator> [facility] [place] Robinette Haines MD ELECTRONICALLY SIGNED 05/06/2013 8:14

## 2014-11-11 NOTE — Discharge Summary (Signed)
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 Chief Complaint/Diagnosis  Patient was admitted in the hospital with persistent nausea vomiting.  Myelosuppression and high fever History of recurrent diffuse Johnson lymphoma treated with chemotherapy with rituximab, ifosfamide, VP-16 last week Received Neulasta on day 22 ?? HPI  69 year old l Called  last night that patient had high fever up to 101.  Chills.  Feeling extremely weak tired.  Had persistent nausea.  Patient was brought in was found to be severely myelosuppressive repeat neutrophil count of 0.  Here for further followup and treatment consideration Patient was admitted in the hospital with diagnoses of myelosuppression, fever, hypotensio     Routine BB:  28-Oct-14 08:30   Platelets (Blood Component) Ready (Result(s) reported on 18 May 2013 at 11:19AM.)  Routine Chem:  28-Oct-14 05:08   Result Comment LABS - This specimen was collected through an   - indwelling catheter or arterial line.  - A minimum of 73mls of blood was wasted prior    - to collecting the sample.  Interpret  - results with caution. PLATELETS/WBC - RESULTS VERIFIED BY REPEAT TESTING.  - CRITICAL VALUE PREVIOUSLY NOTIFIED. PLATELETS - VERIFIED BY SMEAR ESTIMATE  - TPL  Result(s) reported on 18 May 2013 at 07:26AM.  Routine Hem:  28-Oct-14 05:08   WBC (CBC)  0.8  RBC (CBC)  2.29  Hemoglobin (CBC)  7.7  Hematocrit (CBC)  21.6  Platelet  Count (CBC)  21  MCV 95  MCH 33.7  MCHC 35.6  RDW 13.2  Neutrophil % 65.8  Lymphocyte % 22.6  Monocyte % 9.4  Eosinophil % 1.8  Basophil % 0.4  Neutrophil #  0.5  Lymphocyte #  0.2  Monocyte #  0.1  Eosinophil # 0.0  Basophil # 0.0  Diff Comment 1 TOXIC GRANULATION  Diff Comment 2 PLTS VARIED IN SIZE  Diff Comment 3 DOHLE BODIES  Result(s) reported on 18 May 2013 at [place]:  XRay:    23-Oct-14 14:46, Chest PA and Lateral  Chest PA and Lateral   REASON FOR EXAM:    fever  COMMENTS:       PROCEDURE: DXR - DXR CHEST PA (OR AP) AND LATERAL  - May 13 2013  2:46PM     RESULT: Comparison is made to the study of April 30, 2013.    The lungs are adequately inflated but there is tenting of both   hemidiaphragms. This may reflect the presence of fluid or pleural   thickening. There is stable pleural thickening along the right upper   lateral hemithorax. There are are stable soft tissue densities in the   right paratracheal region with deviation of the trachea toward the right.   There is a Port-A-Cath appliance in place whose tip lies in the region of   the midportion of the SVC. The cardiac silhouette is normal in size.   There is no pleural effusion.  IMPRESSION:  When compared to study of10 October there has  not been   significant interval change in the appearance of the chest. There is no   evidence of CHF nor pneumonia. Chronic changes in the right hemithorax   are present.     Dictation Site: 2        Verified By: Maryella Shivers., MD  LabUnknown:  PACS Image    Hospital Course:  Hospital Course During hospital stay patient intravenous antibiotic was started.  Patient received zosyn   and vancomycin . She developed maculopapular rash.  Was considered secondary toZOSYN.  And was discontinued. Levaquin was started.  Patient became afebrile.  Patient also had hypotension intravenous fluid helped.  Gradually patient's vital signs  improved. She  required platelet transfusion and packed cell transfusion.  Because of history of lymphoma as well as previous history of high-dose chemotherapy patient received it radiated product.  Received Neupogen daily. At the time of discharge neutrophil count was 500.  Patient had been afebrile for more than 72 hours.  All the cultures are negative.  Vital signs were stable.  Patient received transfusion with platelets prior to discharge And patient will be followed carefully as outpatient   Condition on Discharge Guarded   DISCHARGE INSTRUCTIONS HOME MEDS:  Medication Reconciliation: Patient's Home Medications at Discharge:     Medication Instructions  advair hfa 115 mcg-21 mcg/inh inhalation aerosol  2 puff(s) inhaled 2 times a day   omeprazole 20 mg delayed release capsule  1 cap(s) orally once a day x 30 days   zyrtec 10 mg oral tablet  1 tab(s) orally once a day   synthroid 25 mcg (0.025 mg) oral tablet  1 tab(s) orally once a day   citalopram hbr 20mg  tab***  TAKE ONE TABLET BY MOUTH EVERY DAY.                                                  Generic for CELEXA 2   acetaminophen-hydrocodone 300 mg-5 mg oral tablet  1 tab(s) orally every 6 hours    PRESCRIPTIONS: ELECTRONICALLY SUBMITTED   Physician's Instructions:  Home Health? No   Treatments None   Home Oxygen? No   Diet Regular   Dietary Supplements None   Diet Consistency Regular Consistency   Activity Limitations None   Referrals None   Return to Work Not Applicable   Time frame for Follow Up Appointment 1-2 days    Time frame for Follow Up Appointment see me tomorrow    Other Comments SEE ME TOMORROW WITH CBC   AND FOR NEUPOGEN   Electronic Signatures: Lendora Keys, Martie Lee (MD)  (Signed 28-Oct-14 12:46)  Authored: ADMISSION DATE AND DIAGNOSIS, CHIEF COMPLAINT/HPI, PERTINENT LABS, PERTINENT RADIOLOGY STUDIES, HOSPITAL COURSE, DISCHARGE INSTRUCTIONS HOME MEDS, PATIENT INSTRUCTIONS   Last Updated:  28-Oct-14 12:46 by Jobe Gibbon (MD)

## 2014-11-11 NOTE — H&P (Signed)
PATIENT NAME:  Anna Mcbride, Anna Mcbride MR#:  [id number] DATE OF BIRTH:  06-20-1946  DATE OF ADMISSION:  07/03/2013  REFERRING PHYSICIAN: Dr. Reita Cliche  PRIMARY CARE PHYSICIAN: Dr. Lisette Grinder  REASON FOR ADMISSION: Fever with neutropenia.   HISTORY OF PRESENT ILLNESS: The patient is a 69 year old female with a history of lymphoma, being treated by Dr. Oliva Bustard. Her last chemotherapy was 8 days ago. She was found to have thrombocytopenia yesterday, and was given a unit of platelets yesterday as an outpatient. She presents now with acute onset of fever, nausea, vomiting, and weakness. In the Emergency Room, the patient was noted to be febrile, and was found to be neutropenic, with a low potassium. She is now admitted for further evaluation.   PAST MEDICAL HISTORY: 1.  Lymphoma, status post stem cell transplant.  2.  Asthma. 3.  History of left subclavian stenosis.  4.  Hypothyroidism.  5.  Previous sinus surgery.  6.  Hyperlipidemia.   MEDICATIONS: 1.  Aspirin 81 mg p.o. daily.  2.  Celexa 20 mg p.o. daily.  3.  Claritin 10 mg p.o. daily.  4.  Advair 250/50, 1 puff b.i.d.  5.  ProAir 2 puffs q. 4 hours p.r.n. shortness of breath.  6.  Omeprazole 20 mg p.o. daily.  7.  Synthroid 25 mcg p.o. daily.   ALLERGIES: CIPRO and TUSSIONEX.   SOCIAL HISTORY: The patient is married. No history of alcohol or tobacco abuse.   FAMILY HISTORY: Positive for hypertension, osteoporosis, and melanoma. Also positive for asthma.   REVIEW OF SYSTEMS:  CONSTITUTIONAL: No change in weight.  EYES: No blurred or double vision. No glaucoma.  ENT: No tinnitus or hearing loss. No nasal discharge or bleeding. No difficulty swallowing.  RESPIRATORY: The patient has had cough, but no wheezing. Denies hemoptysis. No painful respiration.  CARDIOVASCULAR: No chest pain or orthopnea. No palpitations or syncope.  GASTROINTESTINAL: Nausea, vomiting, but no diarrhea.  GENITOURINARY: No dysuria or hematuria. No incontinence.   ENDOCRINE: No polyuria or polydipsia. No heat or cold intolerance.  HEMATOLOGIC: The patient admits to anemia, but denies easy bruising or bleeding.  LYMPHATIC: No swollen glands.  MUSCULOSKELETAL: The patient denies pain in her neck, back, shoulders, knees, hips. No gout.  NEUROLOGIC: No numbness or migraines. Denies stroke or seizures.  PSYCHIATRIC: The patient denies anxiety, insomnia, or depression.   PHYSICAL EXAMINATION: GENERAL: The patient is acutely ill-appearing, in moderate distress.  VITAL SIGNS: Remarkable for a blood pressure of 109/62, with a heart rate of 102, respiratory rate of 20, and a temperature of 101.6.  HEENT: Normocephalic, atraumatic. Pupils equally round and reactive to light and accommodation. Extraocular movements are intact. Sclerae are not icteric. Conjunctivae are clear. Oropharynx is erythematous without exudate. It is dry.  NECK: Supple, without JVD. No adenopathy is noted.  LUNGS: Scattered rhonchi, without wheezes or rales. No dullness. Respiratory effort is normal.  CARDIAC EXAM: Rapid rate with a regular rhythm. Normal S1, S2. No significant rubs, murmurs, or gallops. PMI is nondisplaced. Chest wall is nontender.  ABDOMEN: Mildly tender, but soft. No rebound or guarding. Normoactive bowel sounds. No organomegaly or masses were appreciated. No hernias or bruits were noted.  EXTREMITIES: Without clubbing, cyanosis, edema. Pulses were 2+ bilaterally.  SKIN: Warm and dry, without rash or lesions.  NEUROLOGIC EXAM: Revealed cranial nerves II through XII grossly intact. Deep tendon reflexes were symmetric. Motor and sensory exams nonfocal.  PSYCHIATRIC EXAM: Revealed a patient who was alert and oriented to person, place, and  time. She was cooperative and used good judgment.   LABORATORY DATA: Glucose was 105, with a BUN of 20, creatinine of 0.92, with a potassium of 2.8, and a GFR of greater than 60. Her white count was 0.6, with a hemoglobin of 7.6, and a  platelet count of 27,000. Influenza test was negative. Chest x-ray revealed no acute infiltrate or edema.   ASSESSMENT: 1.  Fever, presumably viral.  2.  Neutropenia.  3.  Hypokalemia.  4.  Lymphoma.  5.  Tachycardia.   PLAN: The patient will be admitted to Oncology. Cultures have been sent off in the Emergency Room. We will supplement her potassium. She will be started empirically on IV antibiotics and Tamiflu. Will begin IV fluids. Will consult Oncology. Will send off a type and screen stat. Supplement oxygen as needed. Follow up labs and chest x-ray in the morning. She is a FULL CODE. Further treatment and evaluation will depend upon the patient's progress. Clear liquid diet for now.   Total time spent on this patient was 50 minutes.     ____________________________ Leonie Douglas Doy Hutching, MD jds:mr D: 07/03/2013 19:29:24 ET T: 07/03/2013 20:11:38 ET JOB#: [id number]  cc: Leonie Douglas. Doy Hutching, MD, <Dictator> Martie Lee. Oliva Bustard, MD Hewitt Blade. Sarina Ser, MD   Deonne Rooks Lennice Sites MD ELECTRONICALLY SIGNED 07/04/2013 10:09

## 2014-11-11 NOTE — Discharge Summary (Signed)
PATIENT NAME:  Anna Mcbride, Anna Mcbride MR#:  [id number] DATE OF BIRTH:  [birth year]  DATE OF ADMISSION:  07/03/2013 DATE OF DISCHARGE:  07/05/2013  DISCHARGE DIAGNOSES:  1.  Fever with history of pancytopenia and non-Hodgkin's lymphoma.  2.  Hypertension, resolved.  3.  Hypokalemia.   DISCHARGE MEDICATIONS:  1.  Advair 115/21, 2 puffs b.i.d.  2.  Omeprazole 20 mg p.o. daily.  3.  Zyrtec 10 mg p.o. daily.  4.  Synthroid 25 mcg p.o. daily.  5.  Citalopram 20 mg 1 tab daily.  6.  Vicodin 300/5, 1 tab q.6 hours as needed for pain. 7.  Acetaminophen 325, 2 tabs p.o. q.6 hours as needed for pain and fever, not to exceed 4 grams in a day.  8.  Potassium chloride 20 mEq p.o. daily.   CONSULTS: Oncology.   PROCEDURES: None.   PERTINENT LABS ON DAY OF DISCHARGE: White blood cell count 1.9, hemoglobin 9.1, platelets 9. Sodium 142, potassium 2.7, creatinine 0.89 and glucose 87.   BRIEF HOSPITAL COURSE:  1.  Fever with underlying pancytopenia and non-Hodgkin's lymphoma. The patient came in with history of fevers at home. No other acute symptoms. No URI symptoms. No GI symptoms. During her hospital stay, she was noted be hypotensive. She was placed in the ICU initially and placed on pressors. She was able to be titrated off the pressors in less than 24 hours and placed on IV fluids and antibiotics. She remained afebrile during her hospital stay. Her white blood cell count did trend up. Cultures were all negative prior to discharge. Because her hemoglobin was decreased, she was transfused 2 units because of her platelet count of 9 on day of discharge. She was seen by Dr. Oliva Bustard, who recommended transfusion and she was given a transfusion of platelets. She was stable from Dr. Metro Kung standpoint. She could be discharged home without antibiotics.  2.  Hypokalemia. Potassium level. 2.7. She is going to be repleted orally with potassium chloride 20 mEq. She will need to follow up with a potassium, platelet, hemoglobin  and white blood cell count within the next week. Has followup with Dr. Oliva Bustard 1 to 2 weeks. Follow up with Dr. Gilford Rile within 2 weeks. She is in stable condition for discharge to home.  ____________________________ Dion Body, MD kl:aw D: 07/05/2013 13:16:30 ET T: 07/05/2013 14:04:47 ET JOB#: [id number]  cc: Dion Body, MD, <Dictator> Dion Body MD ELECTRONICALLY SIGNED 07/20/2013 15:22

## 2014-11-11 NOTE — Consult Note (Signed)
History of Present Illness:  Reason for Consult lymphoma, pancytopenia, neutropenic fever.   HPI   Patient admitted overnight for neutropenic fever and hypotension requiring pressors.  She last received chemotherapy with carboplatinum and etoposide on December 4 and December 5.  She also received platelet transfusion this past Friday, December 12.  Currently,  patient's blood pressure has improved and she is off pressors.  She has had no further fevers.  She denies any neurologic complaints.  She has a poor appetite.  She denies any chest pain or shortness of breath.  She denies any nausea, vomiting, constipation, or diarrhea.  Patient feels generally terrible, but improved since admission and offers no further specific complaints.  PFSH:  Additional Past Medical and Surgical History negative.  Family history: Hypertension.  Social history: Patient denies tobacco alcohol.   Review of Systems:  General fatigue   Performance Status (ECOG) 2   Review of Systems   As per HPI. Otherwise, 10 point system review was negative.   NURSING NOTES: **Vital Signs.:   14-Dec-14 05:00   Temperature Temperature (F): 98.6   Celsius: 37   Pulse Pulse: 88   Systolic BP Systolic BP: 893   Diastolic BP (mmHg) Diastolic BP (mmHg): 58   Mean BP: 79   Pulse Ox % Pulse Ox %: 98   Pulse Ox Heart Rate: 88   Physical Exam:  Physical Exam General: ill-appearing, thin, no acute distress. Eyes: Pink conjunctiva, anicteric sclera. HEENT: Normocephalic, moist mucous membranes, clear oropharnyx. Lungs: Clear to auscultation bilaterally. Heart: Regular rate and rhythm. No rubs, murmurs, or gallops. Abdomen: Soft, nontender, nondistended. No organomegaly noted, normoactive bowel sounds. Musculoskeletal: No edema, cyanosis, or clubbing. Neuro: Alert, answering all questions appropriately. Cranial nerves grossly intact. Skin: No rashes or petechiae noted. Psych: Normal affect.    Cipro:  Rash  Tussionex: Other    Keflex 500 mg oral capsule: 1 cap(s) orally 2 times a day, Status: Active, Quantity: 10, Refills: None   CITALOPRAM HBR 20MG TAB***: TAKE ONE TABLET BY MOUTH EVERY DAY.                                                  Generic for CELEXA 2, Status: Active, Quantity: 30, Refills: 3   Synthroid 25 mcg (0.025 mg) oral tablet: 1 tab(s) orally once a day, Status: Active, Quantity: 30, Refills: None   acetaminophen-HYDROcodone 300 mg-5 mg oral tablet: 1 tab(s) orally every 6 hours, Status: Active, Quantity: 20, Refills: None   omeprazole 20 mg delayed release capsule: 1 cap(s) orally once a day x 30 days, Status: Active, Quantity: 30, Refills: 3   Advair HFA 115 mcg-21 mcg/inh inhalation aerosol: 2 puff(s) inhaled 2 times a day, Status: Active, Quantity: 0, Refills: None   ZyrTEC 10 mg oral tablet: 1 tab(s) orally once a day, Status: Active, Quantity: 0, Refills: None  Laboratory Results: Thyroid:  14-Dec-14 03:01   Thyroid Stimulating Hormone 2.02 (0.45-4.50 (International Unit)  ----------------------- Pregnant patients have  different reference  ranges for TSH:  - - - - - - - - - -  Pregnant, first trimetser:  0.36 - 2.50 uIU/mL)  Hepatic:  14-Dec-14 03:01   Bilirubin, Total 0.8  Alkaline Phosphatase 79 (45-117 NOTE: New Reference Range 06/11/13)  SGPT (ALT) 29  SGOT (AST) 17  Total Protein, Serum  5.3  Albumin, Serum  2.7  Routine Chem:  14-Dec-14 03:01   Result Comment LABS - This specimen was collected through an   - indwelling catheter or arterial line.  - A minimum of 10ms of blood was wasted prior    - to collecting the sample.  Interpret  - results with caution. WBC/PLATELETS - RESULTS VERIFIED BY REPEAT TESTING.  - CRITICAL VALUE PREVIOUSLY NOTIFIED. DIFFERENTIAL - DUE TO THE LOW WBC, THE INSTRUMENT DIFF  - CANNOT BE CONFIRMED BY MANUAL DIFF AND  - IS REPORTED PRIMARILY TO IDENTIFY CELL  - TYPES PRESENT. PLATELETS - VERIFIED BY SMEAR  ESTIMATE  Result(s) reported on 04 Jul 2013 at 04:28AM.  Glucose, Serum  105  BUN 14  Creatinine (comp) 0.84  Sodium, Serum 140  Potassium, Serum  3.0  Chloride, Serum  111  CO2, Serum 24  Calcium (Total), Serum  8.0  Osmolality (calc) 280  eGFR (African American) >60  eGFR (Non-African American) >60 (eGFR values <630mmin/1.73 m2 may be an indication of chronic kidney disease (CKD). Calculated eGFR is useful in patients with stable renal function. The eGFR calculation will not be reliable in acutely ill patients when serum creatinine is changing rapidly. It is not useful in  patients on dialysis. The eGFR calculation may not be applicable to patients at the low and high extremes of body sizes, pregnant women, and vegetarians.)  Anion Gap  5  Routine Hem:  14-Dec-14 03:01   WBC (CBC)  1.1  RBC (CBC)  2.07  Hemoglobin (CBC)  6.8  Hematocrit (CBC)  19.6  Platelet Count (CBC)  23  MCV 95  MCH 32.7  MCHC 34.6  RDW  18.7  Neutrophil % 37.6  Lymphocyte % 21.5  Monocyte % 38.7  Eosinophil % 2.2  Basophil % 0.0  Neutrophil #  0.4  Lymphocyte #  0.2  Monocyte # 0.4  Eosinophil # 0.0  Basophil # 0.0   Assessment and Plan: Impression:   Lymphoma, pancytopenia, neutropenic fever.   Plan:   1.  Lymphoma: Patient last received chemotherapy on December 4 and December 5 as above.  She has been instructed to keep her previously scheduled followup appointments with Dr. ChOliva Bustardor continuation of therapy. Neutropenic fever: Cultures negative to date.  Continue current antibiotics as ordered.  Patient's neutrophil count has improved since yesterday, no need for Neupogen at this point. Anemia: Secondary to chemotherapy.  Hemoglobin declining, agree with blood transfusion. Thrombocytopenia: Patient received platelets on Friday, July 02, 2013.  She does not require transfusion at this time.  Recheck CBC in the morning and consider repeat transfusion at that time if her platelet count  falls below 20. consult, will follow.  Electronic Signatures: FiDelight HohMD)  (Signed 14-Dec-14 10:16)  Authored: HISTORY OF PRESENT ILLNESS, PFSH, ROS, NURSING NOTES, PE, ALLERGIES, HOME MEDICATIONS, LABS, ASSESSMENT AND PLAN   Last Updated: 14-Dec-14 10:16 by FiDelight HohMD)

## 2014-11-16 LAB — COMPREHENSIVE METABOLIC PANEL
Albumin: 3.8 g/dL
Alkaline Phosphatase: 115 U/L
Anion Gap: 4 — ABNORMAL LOW (ref 7–16)
BILIRUBIN TOTAL: 0.4 mg/dL
BUN: 31 mg/dL — AB
CHLORIDE: 107 mmol/L
CO2: 29 mmol/L
Calcium, Total: 9.1 mg/dL
Creatinine: 1.19 mg/dL — ABNORMAL HIGH
GFR CALC AF AMER: 54 — AB
GFR CALC NON AF AMER: 46 — AB
Glucose: 117 mg/dL — ABNORMAL HIGH
Potassium: 3.3 mmol/L — ABNORMAL LOW
SGOT(AST): 17 U/L
SGPT (ALT): 11 U/L — ABNORMAL LOW
Sodium: 140 mmol/L
Total Protein: 6.8 g/dL

## 2014-11-16 LAB — CBC CANCER CENTER
BASOS PCT: 1.1 %
Basophil #: 0 x10 3/mm (ref 0.0–0.1)
EOS PCT: 3.7 %
Eosinophil #: 0.2 x10 3/mm (ref 0.0–0.7)
HCT: 33.6 % — AB (ref 35.0–47.0)
HGB: 11.4 g/dL — AB (ref 12.0–16.0)
LYMPHS PCT: 21.5 %
Lymphocyte #: 0.9 x10 3/mm — ABNORMAL LOW (ref 1.0–3.6)
MCH: 34.8 pg — ABNORMAL HIGH (ref 26.0–34.0)
MCHC: 33.9 g/dL (ref 32.0–36.0)
MCV: 103 fL — ABNORMAL HIGH (ref 80–100)
Monocyte #: 0.5 x10 3/mm (ref 0.2–0.9)
Monocyte %: 10.8 %
NEUTROS ABS: 2.7 x10 3/mm (ref 1.4–6.5)
Neutrophil %: 62.9 %
Platelet: 188 x10 3/mm (ref 150–440)
RBC: 3.26 10*6/uL — ABNORMAL LOW (ref 3.80–5.20)
RDW: 13.4 % (ref 11.5–14.5)
WBC: 4.3 x10 3/mm (ref 3.6–11.0)

## 2014-11-16 LAB — LACTATE DEHYDROGENASE: LDH: 153 U/L

## 2014-11-22 ENCOUNTER — Telehealth: Payer: Self-pay | Admitting: *Deleted

## 2014-11-22 MED ORDER — PREDNISONE 10 MG PO TABS: ORAL_TABLET | ORAL | Status: DC

## 2014-11-22 MED ORDER — AMOXICILLIN-POT CLAVULANATE 875-125 MG PO TABS: 1.0000 | ORAL_TABLET | Freq: Two times a day (BID) | ORAL | Status: DC

## 2014-11-22 NOTE — Telephone Encounter (Signed)
Per Dr Oliva Bustard, Augmentin 500 mg every 12 hours times 7 days. Prednisone taper 60 mg to 0 by 10 mg daily. Call back if develops fever or no improvement.  I spoke with [name] adn told him the above, he asked it be ssent to [facility]. I called prescriptions to [facility]

## 2014-11-22 NOTE — Telephone Encounter (Signed)
Has URI and asking if she should come in today around 3:30

## 2014-11-29 ENCOUNTER — Inpatient Hospital Stay
Admission: AD | Admit: 2014-11-29 | Discharge: 2014-12-05 | DRG: 163 | Disposition: A | Discharge status: Home / Self Care | Payer: Medicare Other | Source: Ambulatory Visit | Attending: Oncology | Admitting: Oncology

## 2014-11-29 ENCOUNTER — Inpatient Hospital Stay: Payer: Medicare Other

## 2014-11-29 ENCOUNTER — Other Ambulatory Visit: Payer: Self-pay | Admitting: *Deleted

## 2014-11-29 ENCOUNTER — Encounter: Payer: Self-pay | Admitting: Oncology

## 2014-11-29 ENCOUNTER — Inpatient Hospital Stay: Payer: Medicare Other | Attending: Family Medicine | Admitting: Family Medicine

## 2014-11-29 ENCOUNTER — Other Ambulatory Visit: Payer: Self-pay | Admitting: Family Medicine

## 2014-11-29 ENCOUNTER — Telehealth: Payer: Self-pay | Admitting: *Deleted

## 2014-11-29 ENCOUNTER — Encounter: Payer: Self-pay | Admitting: Family Medicine

## 2014-11-29 VITALS — BP 107/51 | HR 94 | Temp 98.8°F | Resp 18

## 2014-11-29 DIAGNOSIS — T148 Other injury of unspecified body region: Secondary | ICD-10-CM

## 2014-11-29 DIAGNOSIS — C8330 Diffuse large B-cell lymphoma, unspecified site: Secondary | ICD-10-CM | POA: Diagnosis not present

## 2014-11-29 DIAGNOSIS — J209 Acute bronchitis, unspecified: Secondary | ICD-10-CM | POA: Diagnosis present

## 2014-11-29 DIAGNOSIS — J9600 Acute respiratory failure, unspecified whether with hypoxia or hypercapnia: Secondary | ICD-10-CM | POA: Diagnosis present

## 2014-11-29 DIAGNOSIS — E876 Hypokalemia: Secondary | ICD-10-CM | POA: Diagnosis not present

## 2014-11-29 DIAGNOSIS — R222 Localized swelling, mass and lump, trunk: Secondary | ICD-10-CM | POA: Diagnosis present

## 2014-11-29 DIAGNOSIS — Z8579 Personal history of other malignant neoplasms of lymphoid, hematopoietic and related tissues: Secondary | ICD-10-CM

## 2014-11-29 DIAGNOSIS — Z79899 Other long term (current) drug therapy: Secondary | ICD-10-CM | POA: Diagnosis not present

## 2014-11-29 DIAGNOSIS — D630 Anemia in neoplastic disease: Secondary | ICD-10-CM | POA: Diagnosis present

## 2014-11-29 DIAGNOSIS — Z7982 Long term (current) use of aspirin: Secondary | ICD-10-CM | POA: Diagnosis not present

## 2014-11-29 DIAGNOSIS — R509 Fever, unspecified: Secondary | ICD-10-CM | POA: Diagnosis not present

## 2014-11-29 DIAGNOSIS — R5383 Other fatigue: Secondary | ICD-10-CM | POA: Diagnosis not present

## 2014-11-29 DIAGNOSIS — C8590 Non-Hodgkin lymphoma, unspecified, unspecified site: Secondary | ICD-10-CM | POA: Diagnosis not present

## 2014-11-29 DIAGNOSIS — C9000 Multiple myeloma not having achieved remission: Secondary | ICD-10-CM

## 2014-11-29 DIAGNOSIS — R531 Weakness: Secondary | ICD-10-CM | POA: Insufficient documentation

## 2014-11-29 DIAGNOSIS — I771 Stricture of artery: Secondary | ICD-10-CM

## 2014-11-29 DIAGNOSIS — Z8572 Personal history of non-Hodgkin lymphomas: Secondary | ICD-10-CM

## 2014-11-29 DIAGNOSIS — Z9181 History of falling: Secondary | ICD-10-CM

## 2014-11-29 DIAGNOSIS — J45909 Unspecified asthma, uncomplicated: Secondary | ICD-10-CM | POA: Insufficient documentation

## 2014-11-29 DIAGNOSIS — Z9221 Personal history of antineoplastic chemotherapy: Secondary | ICD-10-CM

## 2014-11-29 DIAGNOSIS — M549 Dorsalgia, unspecified: Secondary | ICD-10-CM | POA: Insufficient documentation

## 2014-11-29 DIAGNOSIS — Z794 Long term (current) use of insulin: Secondary | ICD-10-CM | POA: Diagnosis not present

## 2014-11-29 DIAGNOSIS — Z881 Allergy status to other antibiotic agents status: Secondary | ICD-10-CM

## 2014-11-29 DIAGNOSIS — R110 Nausea: Secondary | ICD-10-CM

## 2014-11-29 DIAGNOSIS — J9601 Acute respiratory failure with hypoxia: Secondary | ICD-10-CM | POA: Insufficient documentation

## 2014-11-29 DIAGNOSIS — J189 Pneumonia, unspecified organism: Secondary | ICD-10-CM | POA: Diagnosis present

## 2014-11-29 DIAGNOSIS — E785 Hyperlipidemia, unspecified: Secondary | ICD-10-CM | POA: Insufficient documentation

## 2014-11-29 DIAGNOSIS — Z8719 Personal history of other diseases of the digestive system: Secondary | ICD-10-CM | POA: Insufficient documentation

## 2014-11-29 DIAGNOSIS — R05 Cough: Secondary | ICD-10-CM

## 2014-11-29 DIAGNOSIS — G4700 Insomnia, unspecified: Secondary | ICD-10-CM

## 2014-11-29 DIAGNOSIS — R0602 Shortness of breath: Secondary | ICD-10-CM | POA: Insufficient documentation

## 2014-11-29 DIAGNOSIS — E039 Hypothyroidism, unspecified: Secondary | ICD-10-CM | POA: Insufficient documentation

## 2014-11-29 DIAGNOSIS — Z9481 Bone marrow transplant status: Secondary | ICD-10-CM | POA: Diagnosis not present

## 2014-11-29 DIAGNOSIS — Z923 Personal history of irradiation: Secondary | ICD-10-CM

## 2014-11-29 HISTORY — DX: Acute respiratory failure, unspecified whether with hypoxia or hypercapnia: J96.00

## 2014-11-29 HISTORY — DX: Stricture of artery: I77.1

## 2014-11-29 LAB — BASIC METABOLIC PANEL
ANION GAP: 9 (ref 5–15)
BUN: 32 mg/dL — AB (ref 6–20)
CALCIUM: 9 mg/dL (ref 8.9–10.3)
CO2: 26 mmol/L (ref 22–32)
Chloride: 98 mmol/L — ABNORMAL LOW (ref 101–111)
Creatinine, Ser: 1.19 mg/dL — ABNORMAL HIGH (ref 0.44–1.00)
GFR, EST AFRICAN AMERICAN: 52 mL/min — AB (ref >60)
GFR, EST NON AFRICAN AMERICAN: 45 mL/min — AB (ref >60)
Glucose, Bld: 93 mg/dL (ref 65–99)
Potassium: 3.5 mmol/L (ref 3.5–5.1)
Sodium: 133 mmol/L — ABNORMAL LOW (ref 135–145)

## 2014-11-29 LAB — CBC WITH DIFFERENTIAL/PLATELET
Basophils Absolute: 0 10*3/uL (ref 0–0.1)
Basophils Relative: 1 %
EOS PCT: 1 %
Eosinophils Absolute: 0 10*3/uL (ref 0–0.7)
HEMATOCRIT: 35.5 % (ref 35.0–47.0)
HEMOGLOBIN: 12.4 g/dL (ref 12.0–16.0)
LYMPHS PCT: 10 %
Lymphs Abs: 0.5 10*3/uL — ABNORMAL LOW (ref 1.0–3.6)
MCH: 35.6 pg — ABNORMAL HIGH (ref 26.0–34.0)
MCHC: 34.9 g/dL (ref 32.0–36.0)
MCV: 101.9 fL — AB (ref 80.0–100.0)
MONO ABS: 0.4 10*3/uL (ref 0.2–0.9)
Monocytes Relative: 7 %
NEUTROS ABS: 4.5 10*3/uL (ref 1.4–6.5)
Neutrophils Relative %: 81 %
Platelets: 171 10*3/uL (ref 150–440)
RBC: 3.49 MIL/uL — ABNORMAL LOW (ref 3.80–5.20)
RDW: 13.7 % (ref 11.5–14.5)
WBC: 5.5 10*3/uL (ref 3.6–11.0)

## 2014-11-29 LAB — GLUCOSE, CAPILLARY: Glucose-Capillary: 108 mg/dL — ABNORMAL HIGH (ref 70–99)

## 2014-11-29 MED ORDER — PIPERACILLIN-TAZOBACTAM 4.5 G IVPB
4.5000 g | Freq: Three times a day (TID) | INTRAVENOUS | Status: DC
  Administered 2014-11-29 – 2014-12-05 (×18): 4.5 g via INTRAVENOUS
  Filled 2014-11-29 (×22): qty 100

## 2014-11-29 MED ORDER — VANCOMYCIN HCL IN DEXTROSE 750-5 MG/150ML-% IV SOLN
750.0000 mg | INTRAVENOUS | Status: DC
  Administered 2014-11-30 – 2014-12-03 (×3): 750 mg via INTRAVENOUS
  Filled 2014-11-29 (×4): qty 150

## 2014-11-29 MED ORDER — VANCOMYCIN HCL IN DEXTROSE 1-5 GM/200ML-% IV SOLN
1000.0000 mg | Freq: Once | INTRAVENOUS | Status: AC
  Administered 2014-11-29: 1000 mg via INTRAVENOUS
  Filled 2014-11-29: qty 200

## 2014-11-29 MED ORDER — ONDANSETRON HCL 4 MG/2ML IJ SOLN
4.0000 mg | Freq: Four times a day (QID) | INTRAMUSCULAR | Status: DC
  Administered 2014-11-29 – 2014-12-03 (×16): 4 mg via INTRAVENOUS
  Filled 2014-11-29 (×17): qty 2

## 2014-11-29 MED ORDER — MOMETASONE FURO-FORMOTEROL FUM 100-5 MCG/ACT IN AERO
2.0000 | INHALATION_SPRAY | Freq: Two times a day (BID) | RESPIRATORY_TRACT | Status: DC
  Administered 2014-11-29 – 2014-12-05 (×11): 2 via RESPIRATORY_TRACT
  Filled 2014-11-29: qty 8.8

## 2014-11-29 MED ORDER — ONDANSETRON HCL 4 MG PO TABS: 4.0000 mg | ORAL_TABLET | Freq: Four times a day (QID) | ORAL | Status: DC

## 2014-11-29 MED ORDER — PANTOPRAZOLE SODIUM 40 MG IV SOLR
40.0000 mg | Freq: Every day | INTRAVENOUS | Status: DC
  Administered 2014-11-29 – 2014-12-01 (×3): 40 mg via INTRAVENOUS
  Filled 2014-11-29 (×3): qty 40

## 2014-11-29 MED ORDER — LEVOTHYROXINE SODIUM 25 MCG PO TABS
25.0000 ug | ORAL_TABLET | Freq: Every day | ORAL | Status: DC
  Administered 2014-12-01 – 2014-12-05 (×5): 25 ug via ORAL
  Filled 2014-11-29 (×5): qty 1

## 2014-11-29 MED ORDER — HYDROCODONE-ACETAMINOPHEN 5-325 MG PO TABS: 1.0000 | ORAL_TABLET | ORAL | Status: DC | PRN

## 2014-11-29 MED ORDER — METHYLPREDNISOLONE SODIUM SUCC 125 MG IJ SOLR
125.0000 mg | INTRAMUSCULAR | Status: DC
  Administered 2014-11-29: 18:00:00 125 mg via INTRAVENOUS
  Filled 2014-11-29: qty 2

## 2014-11-29 MED ORDER — ACETAMINOPHEN 325 MG PO TABS: 650.0000 mg | ORAL_TABLET | ORAL | Status: DC | PRN

## 2014-11-29 MED ORDER — MORPHINE SULFATE 2 MG/ML IJ SOLN: 2.0000 mg | INTRAMUSCULAR | Status: DC | PRN

## 2014-11-29 MED ORDER — IOHEXOL 350 MG/ML SOLN
75.0000 mL | Freq: Once | INTRAVENOUS | Status: AC | PRN
Start: 2014-11-29 — End: 2014-11-29
  Administered 2014-11-29: 75 mL via INTRAVENOUS

## 2014-11-29 MED ORDER — GUAIFENESIN-DM 100-10 MG/5ML PO SYRP
10.0000 mL | ORAL_SOLUTION | ORAL | Status: DC | PRN
  Administered 2014-11-30: 10 mL via ORAL
  Filled 2014-11-29: qty 10

## 2014-11-29 MED ORDER — SODIUM CHLORIDE 0.9 % IJ SOLN: 3.0000 mL | INTRAMUSCULAR | Status: DC | PRN

## 2014-11-29 MED ORDER — LIDOCAINE HCL 2 % EX GEL
1.0000 "application " | Freq: Once | CUTANEOUS | Status: DC
  Filled 2014-11-29: qty 5

## 2014-11-29 MED ORDER — ENOXAPARIN SODIUM 40 MG/0.4ML ~~LOC~~ SOLN
40.0000 mg | SUBCUTANEOUS | Status: DC
  Administered 2014-11-29 – 2014-12-04 (×6): 40 mg via SUBCUTANEOUS
  Filled 2014-11-29 (×6): qty 0.4

## 2014-11-29 MED ORDER — SENNOSIDES-DOCUSATE SODIUM 8.6-50 MG PO TABS: 1.0000 | ORAL_TABLET | Freq: Every evening | ORAL | Status: DC | PRN

## 2014-11-29 MED ORDER — ALUM & MAG HYDROXIDE-SIMETH 200-200-20 MG/5ML PO SUSP: 60.0000 mL | ORAL | Status: DC | PRN

## 2014-11-29 MED ORDER — ONDANSETRON 4 MG PO TBDP: 4.0000 mg | ORAL_TABLET | Freq: Four times a day (QID) | ORAL | Status: DC

## 2014-11-29 MED ORDER — VANCOMYCIN HCL IN DEXTROSE 1-5 GM/200ML-% IV SOLN: 1000.0000 mg | Freq: Two times a day (BID) | INTRAVENOUS | Status: DC

## 2014-11-29 MED ORDER — CITALOPRAM HYDROBROMIDE 20 MG PO TABS
20.0000 mg | ORAL_TABLET | Freq: Every day | ORAL | Status: DC
  Administered 2014-12-01 – 2014-12-05 (×5): 20 mg via ORAL
  Filled 2014-11-29 (×6): qty 1

## 2014-11-29 MED ORDER — SODIUM CHLORIDE 0.9 % IJ SOLN
3.0000 mL | Freq: Four times a day (QID) | INTRAMUSCULAR | Status: DC
  Administered 2014-11-30 – 2014-12-03 (×10): 3 mL via INTRAVENOUS

## 2014-11-29 MED ORDER — SODIUM CHLORIDE 0.9 % IV SOLN
8.0000 mg | Freq: Four times a day (QID) | INTRAVENOUS | Status: DC
  Filled 2014-11-29 (×5): qty 4

## 2014-11-29 MED ORDER — SODIUM CHLORIDE 0.9 % IV SOLN
INTRAVENOUS | Status: DC
  Administered 2014-11-29 – 2014-12-04 (×9): via INTRAVENOUS

## 2014-11-29 MED ORDER — INSULIN ASPART 100 UNIT/ML ~~LOC~~ SOLN
0.0000 [IU] | Freq: Three times a day (TID) | SUBCUTANEOUS | Status: DC
  Filled 2014-11-29: qty 2

## 2014-11-29 NOTE — Progress Notes (Signed)
Report called to [facility] on 1C. Pt transported to room 101 via wheelchair with O2 in place via [place].

## 2014-11-29 NOTE — Telephone Encounter (Signed)
Anna Mcbride has completed antibiotics and steroids and is very sick still. She has fever going up and down, she is weak. needs to be seen ASAP this morning. Per Dr Oliva Bustard, come in now to be seen and possible admission. [name] said he is getting her dressed adn will bring her as soon as he can

## 2014-11-29 NOTE — Progress Notes (Signed)
ANTIBIOTIC CONSULT NOTE - INITIAL  Pharmacy Consult for Vancomycin and Zosyn Indication: respiratory infection that failed outpatient therapy  Allergies  Allergen Reactions  . Tussionex Pennkinetic Er [Hydrocod Polst-Cpm Polst Er] Itching  . Ciprofloxacin Rash    Patient Measurements: Height: 5\' 3"  (160 cm) Weight: 127 lb (57.607 kg) IBW/kg (Calculated) : 52.4  Vital Signs: Temp: 98.8 F (37.1 C) (05/10 1131) Temp Source: Oral (05/10 1131) BP: 107/51 mmHg (05/10 1131) Pulse Rate: 94 (05/10 1131) Intake/Output from previous day:   Intake/Output from this shift:    Labs:  Recent Labs  11/29/14 1507  WBC 5.5  HGB 12.4  PLT 171  CREATININE 1.19*   Estimated Creatinine Clearance: 35.9 mL/min (by C-G formula based on Cr of 1.19). No results for input(s): VANCOTROUGH, VANCOPEAK, VANCORANDOM, GENTTROUGH, GENTPEAK, GENTRANDOM, TOBRATROUGH, TOBRAPEAK, TOBRARND, AMIKACINPEAK, AMIKACINTROU, AMIKACIN in the last 72 hours.   Microbiology: No results found for this or any previous visit (from the past 720 hour(s)).  Medical History: Past Medical History  Diagnosis Date  . Non Hodgkin's lymphoma   . Asthma   . Acute respiratory failure 11/29/2014    Medications:  Anti-infectives    Start     Dose/Rate Route Frequency Ordered Stop   11/30/14 1500  vancomycin (VANCOCIN) IVPB 750 mg/150 ml premix     750 mg 150 mL/hr over 60 Minutes Intravenous Every 24 hours 11/29/14 1652     11/29/14 1600  piperacillin-tazobactam (ZOSYN) IVPB 4.5 g     4.5 g 25 mL/hr over 240 Minutes Intravenous 3 times per day 11/29/14 1446     11/29/14 1500  vancomycin (VANCOCIN) IVPB 1000 mg/200 mL premix     1,000 mg 200 mL/hr over 60 Minutes Intravenous  Once 11/29/14 1448     11/29/14 1415  vancomycin (VANCOCIN) IVPB 1000 mg/200 mL premix  Status:  Discontinued     1,000 mg 200 mL/hr over 60 Minutes Intravenous Every 12 hours 11/29/14 1405 11/29/14 1448     Assessment: Patient has failed  outpatient therapy. Will cover for resistant organisms. Will order Vancomycin 750mg  IV q24h and Zosyn 4.5g IV q8h EI.  Ke=0.035, t1/2=20hr, Vd=40L  Goal of Therapy:  Vancomycin trough level 15-20 mcg/ml  Plan:  Measure antibiotic drug levels at steady state Follow up culture results assess for deescalation of therapy when appropriate  Vira Blanco 11/29/2014,4:53 PM

## 2014-11-29 NOTE — H&P (Signed)
[place] @ [facility] [phone]  Fax:([phone]) [place]: May 26, 1946  MR#: [id number]  FKC#:[id number]  Patient Care Team: Madelyn Brunner, MD as PCP - General (Unknown Physician Specialty) Madelyn Brunner, MD as Referring Physician (Unknown Physician Specialty) Forest Gleason, MD (Unknown Physician Specialty) Christene Lye, MD (General Surgery)  CHIEF COMPLAINT: No chief complaint on file. Dr. Ivy Lynn, III  1.  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  Status post 6 cycles of chemotherapy with CHOP9nd involved field radiation treatment 18,00 rads to the mantle field  and mediasinal boost 2340. finished radiation and chemotherapy in November of 2000 residual mediastinotomy mass 2 cm 2.  In May of 2001 progressive disease with mediastinal mass becoming bigger 8 cm3.  The patient underwent 2 cycles of chemotherapy withRituxan, and ICE.(June of 2001) [facility]dose chemotherapy with  BEAM finished treatment in October of 2001. patient had significant pancytopenia as well as fever lasting for 3 months after transplant. Recurrent sinus infection and pulmonary infection 3.  Patient  underwent EUS biopsy of which was negative .  (November, 2012) 4.  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    No flowsheet data found.  INTERVAL HISTORY: 69year old lady what a previous history of recurrent diffuse large cell lymphoma.  Presently off all chemotherapy.  Patient came today with progressive weakness.  Running fever of 101.  Cough.  Increasing nausea  vomiting.  [place]  Patient also fell down and had left upper extremity fracture which is being managed by orthopedic surgeon. Patient had oral antibiotics and course of prednisone without any improvement and condition kept on declining with increasing shortness of breath dry hacking cough so now being admitted in the hospital for further evaluation with hypoxia acute respiratory failure.  REVIEW OF SYSTEMS:   GENERAL:  Progressively declining condition.  Performance status is poor.  Feeling weak.  Tired.  Running fever.  PERFORMANCE STATUS (ECOG):  02 HEENT:  No visual changes, runny nose, sore throat, mouth sores or tenderness. Lungs: Increasing shortness of breath.  Cough yellowish expectoration.  No improvement after oral antibiotic and steroid.  Hypoxia.  On 2 L of oxygen at home Cardiac:  No chest pain, palpitations, orthopnea, or PND. GI: poor appetite.  Persistent nausea.  No diarrhea. GU:  No urgency, frequency, dysuria, or hematuria. Musculoskeletal:  No back pain.  No joint pain.  No muscle tenderness. Extremities:  No pain or swelling. Skin:  No rashes or skin changes. Neuro:  No headache, numbness or weakness, balance or coordination issues. Endocrine:  No diabetes, thyroid issues, hot flashes or night sweats. Psych: Extremely anxious lady Pain:  No focal pain. Review of systems:  All other systems reviewed and found to be negative.  As per HPI. Otherwise, a complete review of systems is negatve.  PAST MEDICAL HISTORY: Past Medical History  Diagnosis Date  . Non Hodgkin's lymphoma   . Asthma   . Acute respiratory failure 11/29/2014    PAST SURGICAL HISTORY:  Past Surgical History  Procedure Laterality Date  . 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 Nasal sinus surgery  08/07/05 09/09/05    x 2     FAMILY HISTORY Family History  Problem Relation Age of Onset  . Asthma Son     GYNECOLOGIC HISTORY:  No LMP recorded. Patient is  postmenopausal.     ADVANCED DIRECTIVES: Tdoes have living will   HEALTH MAINTENANCE: History  Substance Use Topics  . Smoking status: Never Smoker   . Smokeless tobacco: Never Used  . Alcohol Use: Yes       Allergies  Allergen Reactions  . Tussionex Pennkinetic Er [Hydrocod Polst-Cpm Polst Er] Itching  . Ciprofloxacin Rash    Current Facility-Administered Medications  Medication Dose Route Frequency Provider Last Rate Last Dose  . citalopram (CELEXA) tablet 20 mg  20 mg Oral Daily Evlyn Kanner, NP      . Derrill Memo ON 11/30/2014] levothyroxine (SYNTHROID, LEVOTHROID) tablet 25 mcg  25 mcg Oral QAC breakfast Evlyn Kanner, NP      . mometasone-formoterol (DULERA) 100-5 MCG/ACT inhaler 2 puff  2 puff Inhalation BID Evlyn Kanner, NP        OBJECTIVE:  There were no vitals filed for this visit.   There is no weight on file to calculate BMI.    ECOG FS:2 - Symptomatic, <50% confined to bed  PHYSICAL EXAM: GENERAL: Significant is in mild distress.  Has lost significant weight.  Very apprehensive and anxious.  On oxygen.  MENTAL STATUS:  Alert and oriented to person, place and time. HEAD:  Normocephalic, atraumatic, face symmetric, no Cushingoid features. EYES:.  Pupils equal round and reactive to light and accomodation.  No conjunctivitis or scleral icterus. ENT:  Oropharynx clear without lesion.  Tongue normal. Mucous membranes moist.  RESPIRATORY: Right lung diminishes had entry.  Left lung rhonchi.  CARDIOVASCULAR: Tachycardia BREAST: Not examined . ABDOMEN:  Soft, non-tender, with active bowel sounds, and no hepatosplenomegaly.  No masses. BACK:  No CVA tenderness.  No tenderness on percussion of the back or rib cage. SKIN:  No rashes, ulcers or lesions. EXTREMITIES: No edema, no skin discoloration or tenderness.  No palpable cords. LYMPH NODES: No palpable cervical, supraclavicular, axillary or inguinal adenopathy  NEUROLOGICAL: No focal signs.  Cranial nerves are  intact. PSYCH: Apprehensive.   LAB RESULTS:  No visits with results within 3 Day(s) from this visit. Latest known visit with results is:  Hospital Outpatient Visit on 10/21/2014  Component Date Value Ref Range Status  . WBC 11/16/2014 4.3  3.6-11.0 x10 3/mm  Final  . RBC 11/16/2014 3.26* 3.80-5.20 x10 6/mm  Final  . HGB 11/16/2014 11.4* 12.0-16.0 g/dL Final  . HCT 11/16/2014 33.6* 35.0-47.0 % Final  . MCV 11/16/2014 103* 80-100 fL Final  . MCH 11/16/2014 34.8* 26.0-34.0 pg Final  . MCHC 11/16/2014 33.9  32.0-36.0 g/dL Final  . RDW 11/16/2014 13.4  11.5-14.5 % Final  . Platelet 11/16/2014 188  150-440 x10 3/mm  Final  . Neutrophil % 11/16/2014 62.9   Final  . Lymphocyte % 11/16/2014 21.5   Final  . Monocyte % 11/16/2014 10.8   Final  . Eosinophil % 11/16/2014 3.7   Final  . Basophil % 11/16/2014 1.1   Final  . Neutrophil # 11/16/2014 2.7  1.4-6.5 x10 3/mm  Final  . Lymphocyte # 11/16/2014 0.9* 1.0-3.6 x10 3/mm  Final  . Monocyte # 11/16/2014 0.5  0.2-0.9 x10 3/mm  Final  . Eosinophil #  11/16/2014 0.2  0.0-0.7 x10 3/mm  Final  . Basophil # 11/16/2014 0.0  0.0-0.1 x10 3/mm  Final  . Glucose, CSF 11/16/2014 DNP   Corrected   Comment: 65-99 NOTE: New Reference Range  09/27/14   . BUN 11/16/2014 31*  Final   Comment: 6-20 NOTE: New Reference Range  09/27/14   . Creatinine 11/16/2014 1.19*  Final   Comment: 0.44-1.00 NOTE: New Reference Range  09/27/14   . Sodium, Urine Random 11/16/2014 DNP   Corrected   Comment: 135-145 NOTE: New Reference Range  09/27/14   . Potassium, Urine Random 11/16/2014 DNP   Corrected   Comment: 3.5-5.1 NOTE: New Reference Range  09/27/14   . Chloride, Urine Random 11/16/2014 DNP   Corrected   Comment: 101-111 NOTE: New Reference Range  09/27/14   . Co2 11/16/2014 29   Final   Comment: 22-32 NOTE: New Reference Range  09/27/14   . Calcium, Total 11/16/2014 9.1   Final   Comment: 8.9-10.3 NOTE: New Reference Range  09/27/14   .  SGOT(AST) 11/16/2014 17   Final   Comment: 15-41 NOTE: New Reference Range  09/27/14   . SGPT (ALT) 11/16/2014 11*  Final   Comment: 14-54 NOTE: New Reference Range  09/27/14   . Alkaline Phosphatase 11/16/2014 115   Final   Comment: 38-126 NOTE: New Reference Range  09/27/14   . Albumin 11/16/2014 3.8   Final   Comment: 3.5-5.0 NOTE: New reference range  09/27/14   . Total Protein 11/16/2014 6.8   Final   Comment: 6.5-8.1 NOTE: New Reference Range  09/27/14   . Bilirubin,Total 11/16/2014 0.4   Final   Comment: 0.3-1.2 NOTE: New Reference Range  09/27/14   . Anion Gap 11/16/2014 4* 7-16 Final  . EGFR (African American) 11/16/2014 54*  Final  . EGFR (Non-African Amer.) 11/16/2014 46*  Final   Comment: eGFR values <69m/min/1.73 m2 may be an indication of chronic kidney disease (CKD). Calculated eGFR is useful in patients with stable renal function. The eGFR calculation will not be reliable in acutely ill patients when serum creatinine is changing rapidly. It is not useful in patients on dialysis. The eGFR calculation may not be applicable to patients at the low and high extremes of body sizes, pregnant women, and vegetarians.   . Glucose 11/16/2014 117*  Final   Comment: 65-99 NOTE: New Reference Range  09/27/14   . Sodium 11/16/2014 140   Final   Comment: 135-145 NOTE: New Reference Range  09/27/14   . Potassium 11/16/2014 3.3*  Final   Comment: 3.5-5.1 NOTE: New Reference Range  09/27/14   . Chloride 11/16/2014 107   Final   Comment: 101-111 NOTE: New Reference Range  09/27/14   . LDH, CSF 11/16/2014 DNP   Corrected   Comment: 98-192 NOTE: New Reference Range  09/27/14   . LDH 11/16/2014 153   Final   Comment: 98-192 NOTE: New Reference Range  09/27/14      STUDIES: No results found.  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 She also understands that She can call clinic at any time with any questions, concerns, or complaints.    No matching staging information was found for the patient.  Forest Gleason, MD   11/29/2014 1:32 PM

## 2014-11-30 ENCOUNTER — Encounter
Admission: AD | Disposition: A | Discharge status: Home / Self Care | Payer: Self-pay | Source: Ambulatory Visit | Attending: Oncology

## 2014-11-30 DIAGNOSIS — R0602 Shortness of breath: Secondary | ICD-10-CM | POA: Insufficient documentation

## 2014-11-30 DIAGNOSIS — J189 Pneumonia, unspecified organism: Secondary | ICD-10-CM | POA: Insufficient documentation

## 2014-11-30 HISTORY — PX: VIDEO BRONCHOSCOPY: SHX5072

## 2014-11-30 LAB — CBC
HCT: 34.2 % — ABNORMAL LOW (ref 35.0–47.0)
HEMOGLOBIN: 11.7 g/dL — AB (ref 12.0–16.0)
MCH: 34.4 pg — ABNORMAL HIGH (ref 26.0–34.0)
MCHC: 34 g/dL (ref 32.0–36.0)
MCV: 101.1 fL — ABNORMAL HIGH (ref 80.0–100.0)
Platelets: 155 10*3/uL (ref 150–440)
RBC: 3.39 MIL/uL — AB (ref 3.80–5.20)
RDW: 14 % (ref 11.5–14.5)
WBC: 3.5 10*3/uL — ABNORMAL LOW (ref 3.6–11.0)

## 2014-11-30 LAB — BASIC METABOLIC PANEL
ANION GAP: 11 (ref 5–15)
BUN: 30 mg/dL — ABNORMAL HIGH (ref 6–20)
CO2: 23 mmol/L (ref 22–32)
CREATININE: 1.15 mg/dL — AB (ref 0.44–1.00)
Calcium: 8.6 mg/dL — ABNORMAL LOW (ref 8.9–10.3)
Chloride: 101 mmol/L (ref 101–111)
GFR calc Af Amer: 54 mL/min — ABNORMAL LOW (ref >60)
GFR, EST NON AFRICAN AMERICAN: 47 mL/min — AB (ref >60)
Glucose, Bld: 158 mg/dL — ABNORMAL HIGH (ref 65–99)
Potassium: 3.4 mmol/L — ABNORMAL LOW (ref 3.5–5.1)
SODIUM: 135 mmol/L (ref 135–145)

## 2014-11-30 LAB — FUNGUS CULTURE W SMEAR

## 2014-11-30 LAB — GLUCOSE, CAPILLARY
GLUCOSE-CAPILLARY: 129 mg/dL — AB (ref 70–99)
Glucose-Capillary: 136 mg/dL — ABNORMAL HIGH (ref 70–99)

## 2014-11-30 SURGERY — VIDEO BRONCHOSCOPY WITHOUT FLUORO
Anesthesia: Moderate Sedation | Laterality: Bilateral

## 2014-11-30 MED ORDER — INSULIN ASPART 100 UNIT/ML ~~LOC~~ SOLN
0.0000 [IU] | Freq: Every day | SUBCUTANEOUS | Status: DC
  Administered 2014-12-01: 2 [IU] via SUBCUTANEOUS
  Filled 2014-11-30: qty 2

## 2014-11-30 MED ORDER — MIDAZOLAM HCL 5 MG/ML IJ SOLN
2.0000 mg | Freq: Once | INTRAMUSCULAR | Status: AC
  Administered 2014-11-30: 2 mg via INTRAVENOUS

## 2014-11-30 MED ORDER — FLUCONAZOLE 100 MG PO TABS
100.0000 mg | ORAL_TABLET | Freq: Every day | ORAL | Status: DC
  Administered 2014-11-30 – 2014-12-05 (×6): 100 mg via ORAL
  Filled 2014-11-30 (×7): qty 1

## 2014-11-30 MED ORDER — METHYLPREDNISOLONE SODIUM SUCC 125 MG IJ SOLR
40.0000 mg | Freq: Two times a day (BID) | INTRAMUSCULAR | Status: DC
  Administered 2014-11-30 – 2014-12-01 (×2): 40 mg via INTRAVENOUS
  Filled 2014-11-30 (×2): qty 2

## 2014-11-30 MED ORDER — FENTANYL CITRATE (PF) 100 MCG/2ML IJ SOLN
50.0000 ug | Freq: Once | INTRAMUSCULAR | Status: AC
  Administered 2014-11-30: 50 ug via INTRAVENOUS

## 2014-11-30 NOTE — OR Nursing (Signed)
Transfer back to floor, report called to [employer].

## 2014-11-30 NOTE — Procedures (Signed)
PROCEDURE: BRONCHOSCOPY Therapeutic Aspiration of Tracheobronchial Tree  PROCEDURE DATE: 11/30/2014  TIME: There are other unrelated non-urgent complaints, but due to the busy schedule and the amount of time I've already spent with her, time does not permit me to address these routine issues at today's visit. I've requested another appointment to review these additional issues.   NAME:  Anna Mcbride  DOB:1945/11/02  MRN: [id number] LOC:  101A/101A-AA    HOSP DAY: @LENGTHOFSTAYDAYS @ CODE STATUS:      Code Status Orders        Start     Ordered   11/29/14 1406  Do not attempt resuscitation (DNR)   Continuous    Question Answer Comment  In the event of cardiac or respiratory ARREST Do not call a "code blue"   In the event of cardiac or respiratory ARREST Do not perform Intubation, CPR, defibrillation or ACLS   In the event of cardiac or respiratory ARREST Use medication by any route, position, wound care, and other measures to relive pain and suffering. May use oxygen, suction and manual treatment of airway obstruction as needed for comfort.      11/29/14 1405    Advance Directive Documentation        Most Recent Value   Type of Advance Directive  Living will   Pre-existing out of facility DNR order (yellow form or pink MOST form)     "MOST" Form in Place?            Indications/Preliminary Diagnosis:   Consent: (Place X beside choice/s below)  The benefits, risks and possible complications of the procedure were        explained to:  _x__ patient  _x__ patient's family  ___ other:___________  who verbalized understanding and gave:  ___ verbal  ___ written  _x__ verbal and written  ___ telephone  ___ other:________ consent.      Unable to obtain consent; procedure performed on emergent basis.     Other:       PRESEDATION ASSESSMENT: History and Physical has been performed. Patient meds and allergies have been reviewed. Presedation airway examination has been performed  and documented. Baseline vital signs, sedation score, oxygenation status, and cardiac rhythm were reviewed. Patient was deemed to be in satisfactory condition to undergo the procedure.  PREMEDICATIONS:   Sedative/Narcotic Amt Dose   Versed 2 mg   Fentanyl 50 mcg  Diprivan  mg        Airway Prep (Place X beside choice below)  x 1% Transtracheal Lidocaine Anesthetization 7 cc   Patient prepped per Bronchoscopy Lab Policy       Insertion Route (Place X beside choice below)   Nasal  x Oral   Endotracheal Tube   Tracheostomy   INTRAPROCEDURE MEDICATIONS:  Sedative/Narcotic Amt Dose   Versed  mg   Fentanyl  mcg  Diprivan  mg       Medication Amt Dose  Medication Amt Dose  lidocaine 1% 9 cc  Epinephrine 1:10,000 sol  cc  Xylocaine 4%  cc  Cocaine  cc   TECHNICAL PROCEDURES: (Place X beside choice below)   Procedures  Description    None     Electrocautery     Cryotherapy     Balloon Dilatation     Bronchography     Stent Placement   x  Therapeutic Aspiration     Laser/Argon Plasma    Brachytherapy Catheter Placement    Foreign Body Removal  SPECIMENS (Sites): (Place X beside choice below)  Specimens Description   No Specimens Obtained     Washings   x Lavage mucopurulant secretions   Biopsies    Fine Needle Aspirates   x Brushings    Sputum    FINDINGS: mucopurulent secretions noted in all segments of lungs, no acvtive bleeding, no endobronchial masses, There was erythema and inflammation throughout lung segments.   ESTIMATED BLOOD LOSS: none  COMPLICATIONS/RESOLUTION: none   PROCEDURE DETAILS: Timeout performed and correct patient, name, & ID confirmed. Following prep per Pulmonary policy, appropriate sedation was administered. The Bronchoscope was inserted in to oral cavity with bite block in place. Therapeutic aspiration of Tracheobronchial tree was performed.  Airway exam proceeded with findings, technical procedures, and specimen collection as noted above.  At the end of exam the scope was withdrawn without incident. Impression and Plan as noted below.      IMPRESSION:POST-PROCEDURE DX:  pneumonia     RECOMMENDATION/PLAN:   Follow up cultures AFB,Viral,FUngal cultures Follow up brushing cytology    [name], M.D. Pulmonary & [place] Director Intensive Care Unit

## 2014-11-30 NOTE — OR Nursing (Signed)
PROCEDURE COMPLETED MODERATE SEDATION VERSED 2 MG FENTANYL 50 MCG IV.

## 2014-11-30 NOTE — Evaluation (Signed)
Physical Therapy Evaluation Patient Details Name: Anna Mcbride MRN: [id number] DOB: February 27, 1946 Today's Date: 11/30/2014   History of Present Illness  69 year old lady what a previous history of recurrent diffuse large cell lymphoma. Presently off all chemotherapy. Patient came today with progressive weakn32 year old lady what a previous history of recurrent diffuse large cell lymphoma. Presently off all chemotherapy. Patient came today with progressive weakness. Running fever of 101. Cough. Increasing nausea vomiting. Patient recently had traveled to [place]. Patient also fell down and had left upper extremity fracture which is being managed by orthopedic surgeon.  Clinical Impression  Patient presents with impairment of strength, pain, range of motion, and activity tolerance, limiting ability to perform ADL, IADL, and ambulation. Patient will benefit from skilled intervention to address the above impairments and limitations, in order to restore to prior level of function and to decrease caregiver burden.      Follow Up Recommendations Home health PT    Equipment Recommendations       Recommendations for Other Services       Precautions / Restrictions Precautions Precautions: Shoulder Type of Shoulder Precautions: Pt presenting with L humeral neck/head fracture from a fall 5 weeks ago. Scheduled to begin rehab soon.  Shoulder Interventions: For comfort;At all times Restrictions Weight Bearing Restrictions: No      Mobility  Bed Mobility Overal bed mobility: Independent                Transfers Overall transfer level: Independent Equipment used: None             General transfer comment: 5x STS in 18.43  Ambulation/Gait Ambulation/Gait assistance: Supervision Ambulation Distance (Feet): 160 Feet Assistive device: None Gait Pattern/deviations: WFL(Within Functional Limits)   Gait velocity interpretation: <1.8 ft/sec, indicative of risk for recurrent  falls    Stairs            Wheelchair Mobility    Modified Rankin (Stroke Patients Only)       Balance Overall balance assessment: Independent (Maintains w/ narrow stance, eyec closed, forward reach, and perturbation. )                                           Pertinent Vitals/Pain Pain Assessment: No/denies pain    Home Living Family/patient expects to be discharged to:: Private residence Living Arrangements: Spouse/significant other Available Help at Discharge: Family Type of Home: House Home Access: Stairs to enter Entrance Stairs-Rails: None Entrance Stairs-Number of Steps: 2 stairs, zero railing  Home Layout: One level Home Equipment: None (on Oxygen at baseline (2L) )      Prior Function Level of Independence: Independent               Hand Dominance        Extremity/Trunk Assessment   Upper Extremity Assessment: LUE deficits/detail;Overall [facility] for tasks assessed       LUE Deficits / Details: Not using for functional activity due to fracture.    Lower Extremity Assessment: Overall WFL for tasks assessed      Cervical / Trunk Assessment: Normal  Communication   Communication: No difficulties  Cognition Arousal/Alertness: Awake/alert Behavior During Therapy: WFL for tasks assessed/performed Overall Cognitive Status: Within Functional Limits for tasks assessed                      General Comments  Exercises        Assessment/Plan    PT Assessment Patient needs continued PT services  PT Diagnosis Generalized weakness   PT Problem List Decreased strength;Decreased activity tolerance  PT Treatment Interventions Gait training;Therapeutic activities;Functional mobility training;Therapeutic exercise   PT Goals (Current goals can be found in the Care Plan section) Acute Rehab PT Goals Patient Stated Goal: Return to home with care from husband PT Goal Formulation: With patient Time For Goal  Achievement: 12/14/14 Potential to Achieve Goals: Good    Frequency Min 2X/week   Barriers to discharge        Co-evaluation               End of Session   Activity Tolerance: Patient limited by fatigue Patient left: in bed;with call bell/phone within reach;with bed alarm set Nurse Communication: Mobility status;Need for lift equipment;Precautions;Weight bearing status         Time: 4010-2725 PT Time Calculation (min) (ACUTE ONLY): 24 min   Charges:   PT Evaluation $Initial PT Evaluation Tier I: 1 Procedure     PT G Codes:        Buccola,Allan C Dec 26, 2014, 2:07 PM  Etta Grandchild, PT, DPT, BM

## 2014-11-30 NOTE — Progress Notes (Signed)
Initial Nutrition Assessment  DOCUMENTATION CODES:   INTERVENTION: Meals and Snacks: Cater to patient preferences   NUTRITION DIAGNOSIS:  Inadequate oral intake related to acute illness as evidenced by meal completion < 25%, per patient/family report.   GOAL: Energy Intake: Patient will meet greater than or equal to 90% of their needs   MONITOR:  PO intake, Labs, Weight trends  REASON FOR ASSESSMENT:  Malnutrition Screening Tool    ASSESSMENT: Reason For Admission: Resp failure PMHx: HTN, DM, CHF, Renal insuff., multiple myeloma, sick cell anemia Typical Fluid/ Food Intake: 0-25% of meals recorded per I/O Meal/ Snack Patterns: Patient was eating a regular diet without restrictions PTA. She denies difficulty chewing/ swallowing. She states that she has had some decreased PO x 3-4 days. Supplements: None  Labs:  Electrolyte and Renal Profile:    Recent Labs Lab 11/29/14 1507 11/30/14 0424  BUN 32* 30*  CREATININE 1.19* 1.15*  NA 133* 135  K 3.5 3.4*   Glucose Profile:  Recent Labs  11/29/14 2221 11/30/14 0724 11/30/14 1316  GLUCAP 108* 136* 129*    Meds: NS @ 75/hr, Novolog  UOP: 555m/ 24 hrs  Physical Findings: n/a Weight Changes: Patient reports a UBW of 127#. Current weight is 100% of UBW.  Height:  Ht Readings from Last 1 Encounters:  11/29/14 _0  (1.6 m)    Weight:  Wt Readings from Last 1 Encounters:  11/29/14 127 lb (57.607 kg)    Ideal Body Weight:     Wt Readings from Last 10 Encounters:  11/29/14 127 lb (57.607 kg)  04/21/13 130 lb (58.968 kg)  04/20/13 130 lb (58.968 kg)  01/29/11 129 lb (58.514 kg)    BMI:  Body mass index is 22.5 kg/(m^2).  Skin:  Reviewed, no issues  Diet Order:  Diet regular Room service appropriate?: Yes; Fluid consistency:: Thin  EDUCATION NEEDS:  No education needs identified at this time   Intake/Output Summary (Last 24 hours) at 11/30/14 1452 Last data filed at 11/30/14 1300  Gross  per 24 hour  Intake   1230 ml  Output    500 ml  Net    730 ml    Last BM: 5/9  TRoda Shutters RDN Pager: [phone]: [email or web address]

## 2014-11-30 NOTE — Progress Notes (Signed)
[place] @ [facility] [phone]  Fax:([phone]) Valentine: 01-11-1946  MR#: [id number]  BPZ#:[id number]  Patient Care Team: Madelyn Brunner, MD as PCP - General (Unknown Physician Specialty) Madelyn Brunner, MD as Referring Physician (Unknown Physician Specialty) Forest Gleason, MD (Unknown Physician Specialty) Christene Lye, MD (General Surgery)  CHIEF COMPLAINT: No chief complaint on file.    No history exists.    Oncology Flowsheet 11/29/2014 11/30/2014 11/30/2014 11/30/2014 11/30/2014  enoxaparin (LOVENOX) [place] 40 mg 40 mg        methylPREDNISolone sodium succinate 125 mg/2 mL (SOLU-MEDROL) IV 125 mg - - - -  ondansetron (ZOFRAN) IV 4 mg 4 mg 4 mg 4 mg 4 mg    INTERVAL HISTORY: Patient was admitted in the hospital with high fever cough hypoxia feeling weak and tired.  Patient failed oral antibiotics as outpatient.  Underwent bronchoscopy was started on now vancomycin and Zosyn  REVIEW OF SYSTEMS:   GENERAL:  Feeling better than yesterday continues to feel weak and tired PERFORMANCE STATUS (ECOG): 02 HEENT:  No visual changes, runny nose, sore throat, mouth sores or tenderness. Lungs: Shortness of breath.  On oxygen.  Cough with expectoration. Cardiac:  No chest pain, palpitations, orthopnea, or PND. GI:  No nausea, vomiting, diarrhea, constipation, melena or hematochezia. GU:  No urgency, frequency, dysuria, or hematuria. Musculoskeletal:  No back pain.  No joint pain.  No muscle tenderness. Extremities:  No pain or swelling. Skin:  No rashes or skin changes. Neuro:  No headache, numbness or weakness, balance or coordination issues. Endocrine:  No diabetes, thyroid issues, hot flashes or night sweats. Psych:  No mood changes, depression or anxiety. Pain:  No focal pain. Review of systems:  All other systems reviewed and found to be negative..    As per HPI. Otherwise, a complete review of systems is negatve.  PAST MEDICAL HISTORY: Past  Medical History  Diagnosis Date  . Non Hodgkin's lymphoma   . Asthma   . Acute respiratory failure 11/29/2014    PAST SURGICAL HISTORY: Past Surgical History  Procedure Laterality Date  . 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 Nasal sinus surgery  08/07/05 09/09/05    x 2     FAMILY HISTORY Family History  Problem Relation Age of Onset  . Asthma Son     GYNECOLOGIC HISTORY:  No LMP recorded. Patient is postmenopausal.     ADVANCED DIRECTIVES:    HEALTH MAINTENANCE: History  Substance Use Topics  . Smoking status: Never Smoker   . Smokeless tobacco: Never Used  . Alcohol Use: Yes     Colonoscopy:  PAP:  Bone density:  Lipid panel:  Allergies  Allergen Reactions  . Tussionex Pennkinetic Er [Hydrocod Polst-Cpm Polst Er] Itching  . Ciprofloxacin Rash    Current Facility-Administered Medications  Medication Dose Route Frequency Provider Last Rate Last Dose  . 0.9 %  sodium chloride infusion   Intravenous Continuous Evlyn Kanner, NP 75 mL/hr at 11/30/14 0525    . acetaminophen (TYLENOL) tablet 650 mg  650 mg Oral Q4H PRN Evlyn Kanner, NP      . alum & mag hydroxide-simeth (MAALOX/MYLANTA) 200-200-20 MG/5ML suspension 60 mL  60 mL Oral Q4H PRN Evlyn Kanner, NP      . citalopram (CELEXA) tablet 20 mg  20 mg Oral Daily Evlyn Kanner, NP   20 mg at 11/29/14 1758  . enoxaparin (LOVENOX) injection 40 mg  40 mg Subcutaneous Q24H Evlyn Kanner, NP   40 mg at 11/30/14 1609  . fluconazole (DIFLUCAN) tablet 100 mg  100 mg Oral Daily Forest Gleason, MD      . guaiFENesin-dextromethorphan (ROBITUSSIN DM) 100-10 MG/5ML syrup 10 mL  10 mL Oral Q4H PRN Evlyn Kanner, NP      . HYDROcodone-acetaminophen (NORCO/VICODIN) 5-325 MG per tablet 1-2 tablet  1-2 tablet Oral Q4H PRN Evlyn Kanner, NP      . Derrill Memo ON 12/01/2014] insulin aspart (novoLOG) injection 0-15 Units  0-15 Units Subcutaneous QAC breakfast Forest Gleason, MD      . levothyroxine (SYNTHROID, LEVOTHROID)  tablet 25 mcg  25 mcg Oral QAC breakfast Evlyn Kanner, NP   25 mcg at 11/30/14 1109  . lidocaine (XYLOCAINE) 2 % jelly 1 application  1 application Topical Once Flora Lipps, MD      . methylPREDNISolone sodium succinate (SOLU-MEDROL) 125 mg/2 mL injection 40 mg  40 mg Intravenous Q12H Flora Lipps, MD      . mometasone-formoterol (DULERA) 100-5 MCG/ACT inhaler 2 puff  2 puff Inhalation BID Evlyn Kanner, NP   2 puff at 11/29/14 1801  . morphine 2 MG/ML injection 2 mg  2 mg Intravenous Q2H PRN Evlyn Kanner, NP      . ondansetron [facility]) injection 4 mg  4 mg Intravenous 4 times per day Evlyn Kanner, NP   4 mg at 11/30/14 1610  . pantoprazole (PROTONIX) injection 40 mg  40 mg Intravenous QHS Evlyn Kanner, NP   40 mg at 11/29/14 2235  . piperacillin-tazobactam (ZOSYN) IVPB 4.5 g  4.5 g Intravenous 3 times per day Vira Blanco, RPH   4.5 g at 11/30/14 1353  . senna-docusate (Senokot-S) tablet 1 tablet  1 tablet Oral QHS PRN Evlyn Kanner, NP      . sodium chloride 0.9 % injection 3 mL  3 mL Intravenous PRN Forest Gleason, MD      . sodium chloride 0.9 % injection 3 mL  3 mL Intravenous Q6H Forest Gleason, MD   3 mL at 11/30/14 1609  . vancomycin (VANCOCIN) IVPB 750 mg/150 ml premix  750 mg Intravenous Q24H Vira Blanco, RPH   750 mg at 11/30/14 1609    OBJECTIVE:  Filed Vitals:   11/30/14 1400  BP: 101/80  Pulse: 90  Temp: 98.1 F (36.7 C)  Resp:      Body mass index is 22.5 kg/(m^2).    ECOG FS:2 - Symptomatic, <50% confined to bed  PHYSICAL EXAM: GENERAL:  Patient is in mild distress. MENTAL STATUS:  Alert and oriented to person, place and time. HEAD:    Normocephalic, atraumatic, face symmetric, no Cushingoid features. EYES:  eyes.pale  Pupils equal round and reactive to light and accomodation.  No conjunctivitis or scleral icterus. ENT:  Oropharynx clear without lesion.  Tongue normal. Mucous membranes moist.  RESPIRATORY: Diminished air entry on the right side  rhonchi on both sides.  CARDIOVASCULAR:  Regular rate and rhythm without murmur, rub or gallop. BREAST:  Right breast without masses, skin changes or nipple discharge.  Left breast without masses, skin changes or nipple discharge. ABDOMEN:  Soft, non-tender, with active bowel sounds, and no hepatosplenomegaly.  No masses. BACK:  No CVA tenderness.  No tenderness on percussion of the back or rib cage. SKIN:  No rashes, ulcers or lesions. EXTREMITIES: No edema, no skin discoloration or tenderness.  No palpable cords. LYMPH NODES: No palpable  cervical, supraclavicular, axillary or inguinal adenopathy  NEUROLOGICAL: Unremarkable. PSYCH:  Appropriate.   LAB RESULTS:  Admission on 11/29/2014  Component Date Value Ref Range Status  . Sodium 11/29/2014 133* 135 - 145 mmol/L Final  . Potassium 11/29/2014 3.5  3.5 - 5.1 mmol/L Final  . Chloride 11/29/2014 98* 101 - 111 mmol/L Final  . CO2 11/29/2014 26  22 - 32 mmol/L Final  . Glucose, Bld 11/29/2014 93  65 - 99 mg/dL Final  . BUN 11/29/2014 32* 6 - 20 mg/dL Final  . Creatinine, Ser 11/29/2014 1.19* 0.44 - 1.00 mg/dL Final  . Calcium 11/29/2014 9.0  8.9 - 10.3 mg/dL Final  . GFR calc non Af Amer 11/29/2014 45* >60 mL/min Final  . GFR calc Af Amer 11/29/2014 52* >60 mL/min Final   Comment: (NOTE) The eGFR has been calculated using the CKD EPI equation. This calculation has not been validated in all clinical situations. eGFR's persistently <60 mL/min signify possible Chronic Kidney Disease.   . Anion gap 11/29/2014 9  5 - 15 Final  . WBC 11/29/2014 5.5  3.6 - 11.0 K/uL Final  . RBC 11/29/2014 3.49* 3.80 - 5.20 MIL/uL Final  . Hemoglobin 11/29/2014 12.4  12.0 - 16.0 g/dL Final  . HCT 11/29/2014 35.5  35.0 - 47.0 % Final  . MCV 11/29/2014 101.9* 80.0 - 100.0 fL Final  . MCH 11/29/2014 35.6* 26.0 - 34.0 pg Final  . MCHC 11/29/2014 34.9  32.0 - 36.0 g/dL Final  . RDW 11/29/2014 13.7  11.5 - 14.5 % Final  . Platelets 11/29/2014 171  150 - 440  K/uL Final  . Neutrophils Relative % 11/29/2014 81   Final  . Neutro Abs 11/29/2014 4.5  1.4 - 6.5 K/uL Final  . Lymphocytes Relative 11/29/2014 10   Final  . Lymphs Abs 11/29/2014 0.5* 1.0 - 3.6 K/uL Final  . Monocytes Relative 11/29/2014 7   Final  . Monocytes Absolute 11/29/2014 0.4  0.2 - 0.9 K/uL Final  . Eosinophils Relative 11/29/2014 1   Final  . Eosinophils Absolute 11/29/2014 0.0  0 - 0.7 K/uL Final  . Basophils Relative 11/29/2014 1   Final  . Basophils Absolute 11/29/2014 0.0  0 - 0.1 K/uL Final  . Sodium 11/30/2014 135  135 - 145 mmol/L Final  . Potassium 11/30/2014 3.4* 3.5 - 5.1 mmol/L Final  . Chloride 11/30/2014 101  101 - 111 mmol/L Final  . CO2 11/30/2014 23  22 - 32 mmol/L Final  . Glucose, Bld 11/30/2014 158* 65 - 99 mg/dL Final  . BUN 11/30/2014 30* 6 - 20 mg/dL Final  . Creatinine, Ser 11/30/2014 1.15* 0.44 - 1.00 mg/dL Final  . Calcium 11/30/2014 8.6* 8.9 - 10.3 mg/dL Final  . GFR calc non Af Amer 11/30/2014 47* >60 mL/min Final  . GFR calc Af Amer 11/30/2014 54* >60 mL/min Final   Comment: (NOTE) The eGFR has been calculated using the CKD EPI equation. This calculation has not been validated in all clinical situations. eGFR's persistently <60 mL/min signify possible Chronic Kidney Disease.   . Anion gap 11/30/2014 11  5 - 15 Final  . WBC 11/30/2014 3.5* 3.6 - 11.0 K/uL Final  . RBC 11/30/2014 3.39* 3.80 - 5.20 MIL/uL Final  . Hemoglobin 11/30/2014 11.7* 12.0 - 16.0 g/dL Final  . HCT 11/30/2014 34.2* 35.0 - 47.0 % Final  . MCV 11/30/2014 101.1* 80.0 - 100.0 fL Final  . MCH 11/30/2014 34.4* 26.0 - 34.0 pg Final  . MCHC 11/30/2014  34.0  32.0 - 36.0 g/dL Final  . RDW 11/30/2014 14.0  11.5 - 14.5 % Final  . Platelets 11/30/2014 155  150 - 440 K/uL Final  . Glucose-Capillary 11/29/2014 108* 70 - 99 mg/dL Final  . Glucose-Capillary 11/30/2014 136* 70 - 99 mg/dL Final  . Specimen Description 11/30/2014 BRONCHIAL ALVEOLAR LAVAGE   Final  . Special Requests  11/30/2014 Immunocompromised   Final  . Report Status 11/30/2014 11/30/2014 FINAL   Final  . Glucose-Capillary 11/30/2014 129* 70 - 99 mg/dL Final    STUDIES: X-ray Chest Pa And Lateral  11/29/2014   CLINICAL DATA:  History of recurrent diffuse large cell lymphoma. Fever and cough with nausea and vomiting. Recent fall with left arm fracture. Hypoxia.  EXAM: CHEST  2 VIEW  COMPARISON:  09/28/2014 and 06/14/2014  FINDINGS: Left subclavian Port-A-Cath unchanged with tip in the SVC. Lungs are adequately inflated with continued paramediastinal scarring right worse than left as well as right apical scarring likely due in part to post radiation change. Minimal pleural fluid tracking to the right apex. Slight worsening opacification over the anterior right upper lobe on the lateral film which may be due to infection. Possible small amount right pleural fluid slightly worse. Cardiac silhouette is within normal. There is a known acute displaced left humeral neck fracture extending into the region of the greater tuberosity. Remainder the exam is unchanged.  IMPRESSION: Slight worsening airspace density over the anterior right upper lobe on the lateral film which may be due to infection. Chronic changes in the paramediastinal regions and right apex. Small amount right pleural fluid likely slightly worse.  Known acute minimally displaced left humeral neck/ head fracture.   Electronically Signed   By: Marin Olp M.D.   On: 11/29/2014 17:16   Ct Angio Chest Pe W/cm &/or Wo Cm  11/29/2014   CLINICAL DATA:  Recurrent large cell lymphoma presenting with shortness of breath, cough, and fever  EXAM: CT ANGIOGRAPHY CHEST WITH CONTRAST  TECHNIQUE: Multidetector CT imaging of the chest was performed using the standard protocol during bolus administration of intravenous contrast. Multiplanar CT image reconstructions and MIPs were obtained to evaluate the vascular anatomy.  CONTRAST:  60mL OMNIPAQUE IOHEXOL 350 MG/ML SOLN   COMPARISON:  Chest CT April 18, 2014 and chest radiograph Nov 29, 2014  FINDINGS: There is no demonstrable pulmonary embolus. There is no thoracic aortic aneurysm or dissection.  There is underlying centrilobular emphysematous change. There is loculated effusion on the right with consolidation arising in the right perihilar region with extension into the right upper lobe. Some of this change is felt to be of post radiation therapy etiology. There is moderate volume loss throughout this area. There is patchy airspace consolidation at multiple sites in the right lower lobe, characterized by several ill-defined semi-solid opacities. There is also patchy atelectasis in the lingula, primarily inferiorly. There is focal volume loss in the left perihilar region which is stable and felt to be primarily of radiation therapy etiology. There is mild lower lobe bronchiectatic change bilaterally.  Thyroid appears unremarkable. There is no apparent adenopathy. The pericardium is not thickened.  In the visualized upper abdomen, there is atherosclerotic change in aorta. There is hepatic steatosis.  There are no blastic or lytic bone lesions. Port-A-Cath tip is in the superior vena cava.  Review of the MIP images confirms the above findings.  IMPRESSION: No demonstrable pulmonary embolus.  Post radiation therapy change in both perihilar regions. Areas of patchy ground-glass type opacity  in the right lower lobe, likely representing pneumonia. Atypical presentation of neoplasm is possible; a followup study in 6 day weeks may be advisable to further assess this region.  There is underlying emphysema. There is mild lower lobe bronchiectatic change.  No adenopathy.  Moderate loculated effusion on the right.  Hepatic steatosis.  Port-A-Cath tip in superior vena cava.   Electronically Signed   By: Lowella Grip III M.D.   On: 11/29/2014 17:33    ASSESSMENT: Acute bronchitis and pneumonia.  Possibility of atypical bacterial  infection cannot be ruled out.  Patient underwent bronchoscopy on broad-spectrum antibiotic  MEDICAL DECISION MAKING:  All lab data has been reviewed.  Bronchoscopy note has been reviewed.  Proceed with physiotherapy.  Continue broad-spectrum antibiotic steroid Ttill culture report available. CT scan of chest scan has been reviewed independently All lab data has been reviewed  Patient expressed understanding and was in agreement with this plan. She also understands that She can call clinic at any time with any questions, concerns, or complaints.    No matching staging information was found for the patient.  Forest Gleason, MD   11/30/2014 7:18 PM

## 2014-11-30 NOTE — Care Management (Signed)
Admitted to [facility] with the diagnosis of acute respiratory failure. Lives with husband, [name] [phone]). Home oxygen thru [facility] x 1 year. Home Health in the past. Doesn't remember name of company. No skilled facility. Uses no aides for ambulation and takes care of all [place] activities of daily living herself. Sees Dr. Lisette Grinder and Dr. Jeb Levering.  Husband will transport. Shelbie Ammons RN MSN Care Management [phone]

## 2014-11-30 NOTE — OR Nursing (Signed)
TIMEOUT COMPLETED

## 2014-11-30 NOTE — OR Nursing (Signed)
Transferred via stretcher to [place] room.

## 2014-11-30 NOTE — Plan of Care (Addendum)
Problem: Discharge Progression Outcomes Goal: Discharge plan in place and appropriate Outcome: Progressing Patient is from home with husband Hx of Non Hodgkins Lymphoma, HTN, CHF, DM, sickle cell anemia, asthma, continue home meds. Off all chemotherapy at this time. Patient is a DNR Goal: Other Discharge Outcomes/Goals Outcome: Progressing Received patient at 2300 from Geni Bers, RN. Patient is alert and oriented, no c/o pain at this time. NPO for bronchoscopy, consent signed. Toileting offered with rounds. On 3 L of oxygen at this time. Receiving IV fluids and antibiotics. Zofran continued q6 hours scheduled.

## 2014-12-01 ENCOUNTER — Encounter: Payer: Self-pay | Admitting: Family Medicine

## 2014-12-01 DIAGNOSIS — C9000 Multiple myeloma not having achieved remission: Secondary | ICD-10-CM | POA: Insufficient documentation

## 2014-12-01 DIAGNOSIS — D630 Anemia in neoplastic disease: Secondary | ICD-10-CM

## 2014-12-01 HISTORY — DX: Anemia in neoplastic disease: D63.0

## 2014-12-01 LAB — MISC LABCORP TEST (SEND OUT): Labcorp test code: 183753

## 2014-12-01 LAB — GLUCOSE, CAPILLARY: Glucose-Capillary: 139 mg/dL — ABNORMAL HIGH (ref 65–99)

## 2014-12-01 MED ORDER — PREDNISONE 20 MG PO TABS
40.0000 mg | ORAL_TABLET | Freq: Every day | ORAL | Status: DC
  Administered 2014-12-03 – 2014-12-05 (×3): 40 mg via ORAL
  Filled 2014-12-01 (×3): qty 2

## 2014-12-01 MED ORDER — TEMAZEPAM 7.5 MG PO CAPS
7.5000 mg | ORAL_CAPSULE | Freq: Every evening | ORAL | Status: DC | PRN
  Administered 2014-12-01 – 2014-12-04 (×4): 7.5 mg via ORAL
  Filled 2014-12-01 (×5): qty 1

## 2014-12-01 MED ORDER — PREDNISONE 50 MG PO TABS
50.0000 mg | ORAL_TABLET | Freq: Every day | ORAL | Status: AC
  Administered 2014-12-02: 08:00:00 50 mg via ORAL
  Filled 2014-12-01: qty 1

## 2014-12-01 MED ORDER — OXYCODONE HCL 5 MG PO TABS: 5.0000 mg | ORAL_TABLET | Freq: Once | ORAL | Status: DC

## 2014-12-01 NOTE — Progress Notes (Signed)
[place]  [phone]) [phone]  Fax:(336) [place]: 09-Sep-1945  MR#: [id number]  KHT#:[id number]  Patient Care Team: Madelyn Brunner, MD as PCP - General (Unknown Physician Specialty) Madelyn Brunner, MD as Referring Physician (Unknown Physician Specialty) Forest Gleason, MD (Unknown Physician Specialty) Christene Lye, MD (General Surgery)  CHIEF COMPLAINT:  Chief Complaint  Patient presents with  . Acute Visit    INTERVAL HISTORY: Patient is here for further follow up and discussion regarding transfusion of blood for hgb of 6.9. She is a resident of [facility] for rehabilitation. Has recently moved here from the [place]. Was diagnosed at [facility] med center with multiple myeloma. She had previously been treated with velcade injections. She reports her last injection was in April 2016. She then became ill and was hospitalized. Her family then moved her to [facility] and subsequently to [facility] for rehab. She has been weak and fatigued. Reports back pain is 6/10 in the lower back.   REVIEW OF SYSTEMS:   Review of Systems  Constitutional: Positive for malaise/fatigue.  Musculoskeletal: Positive for back pain.  Neurological: Positive for weakness.  All other systems reviewed and are negative.   As per HPI. Otherwise, a complete review of systems is negatve.  PAST MEDICAL HISTORY: Past Medical History  Diagnosis Date  . Non Hodgkin's lymphoma   . Asthma   . Acute respiratory failure 11/29/2014  . Multiple myeloma 12/01/2014  . 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 Nasal sinus surgery  08/07/05 09/09/05    x 2     FAMILY HISTORY Family History  Problem Relation Age of Onset  . Asthma Son     GYNECOLOGIC HISTORY:  No LMP recorded. Patient is postmenopausal.     ADVANCED DIRECTIVES:    HEALTH  MAINTENANCE: History  Substance Use Topics  . Smoking status: Never Smoker   . Smokeless tobacco: Never Used  . Alcohol Use: Yes     Colonoscopy:  PAP:  Bone density:  Lipid panel:  Allergies  Allergen Reactions  . Tussionex Pennkinetic Er [Hydrocod Polst-Cpm Polst Er] Itching  . Ciprofloxacin Rash    No current facility-administered medications for this visit.   No current outpatient prescriptions on file.   Facility-Administered Medications Ordered in Other Visits  Medication Dose Route Frequency Provider Last Rate Last Dose  . 0.9 %  sodium chloride infusion   Intravenous Continuous Evlyn Kanner, NP 75 mL/hr at 11/30/14 1954    . acetaminophen (TYLENOL) tablet 650 mg  650 mg Oral Q4H PRN Evlyn Kanner, NP      . alum & mag hydroxide-simeth (MAALOX/MYLANTA) 200-200-20 MG/5ML suspension 60 mL  60 mL Oral Q4H PRN Evlyn Kanner, NP      . citalopram (CELEXA) tablet 20 mg  20 mg Oral Daily Evlyn Kanner, NP   20 mg at 12/01/14 0926  . enoxaparin (LOVENOX) injection 40 mg  40 mg Subcutaneous Q24H Evlyn Kanner, NP   40 mg at 11/30/14 1609  . fluconazole (DIFLUCAN) tablet 100 mg  100 mg Oral Daily Forest Gleason, MD   100 mg at 12/01/14 0926  . guaiFENesin-dextromethorphan (ROBITUSSIN DM) 100-10 MG/5ML syrup 10 mL  10 mL Oral Q4H PRN Evlyn Kanner, NP   10 mL at 11/30/14 1954  . HYDROcodone-acetaminophen (NORCO/VICODIN)  5-325 MG per tablet 1-2 tablet  1-2 tablet Oral Q4H PRN Evlyn Kanner, NP      . insulin aspart (novoLOG) injection 0-15 Units  0-15 Units Subcutaneous QAC breakfast Forest Gleason, MD   2 Units at 12/01/14 0854  . levothyroxine (SYNTHROID, LEVOTHROID) tablet 25 mcg  25 mcg Oral QAC breakfast Evlyn Kanner, NP   25 mcg at 12/01/14 0845  . lidocaine (XYLOCAINE) 2 % jelly 1 application  1 application Topical Once Flora Lipps, MD      . methylPREDNISolone sodium succinate (SOLU-MEDROL) 125 mg/2 mL injection 40 mg  40 mg Intravenous Q12H Flora Lipps,  MD   40 mg at 12/01/14 0926  . mometasone-formoterol (DULERA) 100-5 MCG/ACT inhaler 2 puff  2 puff Inhalation BID Evlyn Kanner, NP   2 puff at 12/01/14 0844  . morphine 2 MG/ML injection 2 mg  2 mg Intravenous Q2H PRN Evlyn Kanner, NP      . ondansetron [facility]) injection 4 mg  4 mg Intravenous 4 times per day Evlyn Kanner, NP   4 mg at 12/01/14 0545  . pantoprazole (PROTONIX) injection 40 mg  40 mg Intravenous QHS Evlyn Kanner, NP   40 mg at 11/30/14 2306  . piperacillin-tazobactam (ZOSYN) IVPB 4.5 g  4.5 g Intravenous 3 times per day Vira Blanco, RPH   4.5 g at 12/01/14 0545  . senna-docusate (Senokot-S) tablet 1 tablet  1 tablet Oral QHS PRN Evlyn Kanner, NP      . sodium chloride 0.9 % injection 3 mL  3 mL Intravenous PRN Forest Gleason, MD      . sodium chloride 0.9 % injection 3 mL  3 mL Intravenous Q6H Forest Gleason, MD   3 mL at 12/01/14 0546  . vancomycin (VANCOCIN) IVPB 750 mg/150 ml premix  750 mg Intravenous Q24H Vira Blanco, RPH   750 mg at 11/30/14 1609    OBJECTIVE: Filed Vitals:   11/29/14 1131  BP: 107/51  Pulse: 94  Temp: 98.8 F (37.1 C)  Resp: 18     There is no weight on file to calculate BMI.    ECOG FS:3 - Symptomatic, >50% confined to bed  General: Well-developed, well-nourished, no acute distress. In wheelchair.  Eyes: Pink conjunctiva, anicteric sclera. Lungs: Clear to auscultation bilaterally. Heart: Regular rate and rhythm. No rubs, murmurs, or gallops. Abdomen: Soft, nontender, nondistended. No organomegaly noted, normoactive bowel sounds. Musculoskeletal: No edema, cyanosis, or clubbing. Neuro: Alert, answering all questions appropriately. Cranial nerves grossly intact. Psych: Normal affect.    LAB RESULTS:     Component Value Date/Time   NA 135 11/30/2014 0424   NA 140 11/16/2014 1415   K 3.4* 11/30/2014 0424   K 3.3* 11/16/2014 1415   CL 101 11/30/2014 0424   CL 107 11/16/2014 1415   CO2 23 11/30/2014 0424   CO2 29  11/16/2014 1415   GLUCOSE 158* 11/30/2014 0424   GLUCOSE 117* 11/16/2014 1415   BUN 30* 11/30/2014 0424   BUN 31* 11/16/2014 1415   CREATININE 1.15* 11/30/2014 0424   CREATININE 1.19* 11/16/2014 1415   CALCIUM 8.6* 11/30/2014 0424   CALCIUM 9.1 11/16/2014 1415   PROT 6.8 11/16/2014 1415   ALBUMIN 3.8 11/16/2014 1415   AST 17 11/16/2014 1415   ALT 11* 11/16/2014 1415   ALKPHOS 115 11/16/2014 1415   GFRNONAA 47* 11/30/2014 0424   GFRNONAA 46* 11/16/2014 1415   GFRAA 54* 11/30/2014 0424   GFRAA 54*  11/16/2014 1415    No results found for: SPEP, UPEP  Lab Results  Component Value Date   WBC 3.5* 11/30/2014   NEUTROABS 4.5 11/29/2014   HGB 11.7* 11/30/2014   HCT 34.2* 11/30/2014   MCV 101.1* 11/30/2014   PLT 155 11/30/2014      Chemistry      Component Value Date/Time   NA 135 11/30/2014 0424   NA 140 11/16/2014 1415   K 3.4* 11/30/2014 0424   K 3.3* 11/16/2014 1415   CL 101 11/30/2014 0424   CL 107 11/16/2014 1415   CO2 23 11/30/2014 0424   CO2 29 11/16/2014 1415   BUN 30* 11/30/2014 0424   BUN 31* 11/16/2014 1415   CREATININE 1.15* 11/30/2014 0424   CREATININE 1.19* 11/16/2014 1415      Component Value Date/Time   CALCIUM 8.6* 11/30/2014 0424   CALCIUM 9.1 11/16/2014 1415   ALKPHOS 115 11/16/2014 1415   AST 17 11/16/2014 1415   ALT 11* 11/16/2014 1415       No results found for: LABCA2  No components found for: LABCA125  No results for input(s): INR in the last 168 hours.  No results found for: COLORURINE, APPEARANCEUR, LABSPEC, PHURINE, GLUCOSEU, HGBUR, BILIRUBINUR, KETONESUR, PROTEINUR, UROBILINOGEN, NITRITE, LEUKOCYTESUR  STUDIES: X-ray Chest Pa And Lateral  11/29/2014   CLINICAL DATA:  History of recurrent diffuse large cell lymphoma. Fever and cough with nausea and vomiting. Recent fall with left arm fracture. Hypoxia.  EXAM: CHEST  2 VIEW  COMPARISON:  09/28/2014 and 06/14/2014  FINDINGS: Left subclavian Port-A-Cath unchanged with tip in the SVC.  Lungs are adequately inflated with continued paramediastinal scarring right worse than left as well as right apical scarring likely due in part to post radiation change. Minimal pleural fluid tracking to the right apex. Slight worsening opacification over the anterior right upper lobe on the lateral film which may be due to infection. Possible small amount right pleural fluid slightly worse. Cardiac silhouette is within normal. There is a known acute displaced left humeral neck fracture extending into the region of the greater tuberosity. Remainder the exam is unchanged.  IMPRESSION: Slight worsening airspace density over the anterior right upper lobe on the lateral film which may be due to infection. Chronic changes in the paramediastinal regions and right apex. Small amount right pleural fluid likely slightly worse.  Known acute minimally displaced left humeral neck/ head fracture.   Electronically Signed   By: Marin Olp M.D.   On: 11/29/2014 17:16   Ct Angio Chest Pe W/cm &/or Wo Cm  11/29/2014   CLINICAL DATA:  Recurrent large cell lymphoma presenting with shortness of breath, cough, and fever  EXAM: CT ANGIOGRAPHY CHEST WITH CONTRAST  TECHNIQUE: Multidetector CT imaging of the chest was performed using the standard protocol during bolus administration of intravenous contrast. Multiplanar CT image reconstructions and MIPs were obtained to evaluate the vascular anatomy.  CONTRAST:  22mL OMNIPAQUE IOHEXOL 350 MG/ML SOLN  COMPARISON:  Chest CT April 18, 2014 and chest radiograph Nov 29, 2014  FINDINGS: There is no demonstrable pulmonary embolus. There is no thoracic aortic aneurysm or dissection.  There is underlying centrilobular emphysematous change. There is loculated effusion on the right with consolidation arising in the right perihilar region with extension into the right upper lobe. Some of this change is felt to be of post radiation therapy etiology. There is moderate volume loss throughout this  area. There is patchy airspace consolidation at multiple sites in the right  lower lobe, characterized by several ill-defined semi-solid opacities. There is also patchy atelectasis in the lingula, primarily inferiorly. There is focal volume loss in the left perihilar region which is stable and felt to be primarily of radiation therapy etiology. There is mild lower lobe bronchiectatic change bilaterally.  Thyroid appears unremarkable. There is no apparent adenopathy. The pericardium is not thickened.  In the visualized upper abdomen, there is atherosclerotic change in aorta. There is hepatic steatosis.  There are no blastic or lytic bone lesions. Port-A-Cath tip is in the superior vena cava.  Review of the MIP images confirms the above findings.  IMPRESSION: No demonstrable pulmonary embolus.  Post radiation therapy change in both perihilar regions. Areas of patchy ground-glass type opacity in the right lower lobe, likely representing pneumonia. Atypical presentation of neoplasm is possible; a followup study in 6 day weeks may be advisable to further assess this region.  There is underlying emphysema. There is mild lower lobe bronchiectatic change.  No adenopathy.  Moderate loculated effusion on the right.  Hepatic steatosis.  Port-A-Cath tip in superior vena cava.   Electronically Signed   By: Lowella Grip III M.D.   On: 11/29/2014 17:33    ASSESSMENT:  Multiple Myeloma Anemia  PLAN:   1. Multiple Myeloma. Patient is to see Dr. Ma Hillock on Wednesday of next week to develop plans. Patient with likely disease related pain. Will administer Oxycodone PO for pain management.  2. Anemia. Will transfuse 1 unit of pRBCs today. I have discussed the risks of transfusions with the patient (including but not limited to): Fluid overload, febrile transfusion reaction, transfusion reaction, Hepatitis B, Hepatitis C, HIV, as well as other currently unknown infectious complications. The patient will sign a written consent  prior to the procedure.   Patient has follow up scheduled with Dr. Ma Hillock for next week. She should continue with this schedule.  Patient expressed understanding and was in agreement with this plan. She also understands that She can call clinic at any time with any questions, concerns, or complaints.   Dr. Ma Hillock was available for consultation and review of plan of care for this patient.     No matching staging information was found for the patient.  Evlyn Kanner, NP   12/01/2014 9:50 AM

## 2014-12-01 NOTE — Progress Notes (Signed)
[place] @ [facility] Telephone:(336) [phone]  Fax:([phone]) [place]: 06-16-1946  MR#: [id number]  HYQ#:[id number]  Patient Care Team: Madelyn Brunner, MD as PCP - General (Unknown Physician Specialty) Madelyn Brunner, MD as Referring Physician (Unknown Physician Specialty) Forest Gleason, MD (Unknown Physician Specialty) Christene Lye, MD (General Surgery)  CHIEF COMPLAINT: No chief complaint on file.    No history exists.    Oncology Flowsheet 11/30/2014 11/30/2014 11/30/2014 11/30/2014 11/30/2014 12/01/2014 12/01/2014  enoxaparin (LOVENOX) [place] 40 mg         - -  methylPREDNISolone sodium succinate 125 mg/2 mL (SOLU-MEDROL) IV 40 mg         40 mg    ondansetron (ZOFRAN) IV 4 _0  mg 4 mg    INTERVAL HISTORY: Patient with the previous history of recurrent diffuse large cell lymphoma was admitted in the hospital with pneumonia.  Patient underwent bronchoscopy yesterday and CT scan gradually improving on vancomycin and Zosyn.  All the cultures are pending.  Patient is somewhat depressed. Complains of night sweats.  REVIEW OF SYSTEMS:   GENERAL:  Feeling better than yesterday continues to feel weak and tired PERFORMANCE STATUS (ECOG): 02 HEENT:  No visual changes, runny nose, sore throat, mouth sores or tenderness. Lungs: Shortness of breath.  On oxygen.  Cough with expectoration. Cardiac:  No chest pain, palpitations, orthopnea, or PND. GI:  No nausea, vomiting, diarrhea, constipation, melena or hematochezia. GU:  No urgency, frequency, dysuria, or hematuria. Musculoskeletal:  No back pain.  No joint pain.  No muscle tenderness. Extremities:  No pain or swelling. Skin:  No rashes or skin changes. Neuro:  No headache, numbness or weakness, balance or coordination issues. Endocrine:  No diabetes, thyroid issues, hot flashes  Psych:  No mood changes, depression or anxiety. Pain:  No focal pain. Review of systems:  All other systems reviewed  and found to be negative..    As per HPI. Otherwise, a complete review of systems is negatve.  PAST MEDICAL HISTORY: Past Medical History  Diagnosis Date  . Non Hodgkin's lymphoma   . Asthma   . Acute respiratory failure 11/29/2014  . Multiple myeloma 12/01/2014  . 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 Nasal sinus surgery  08/07/05 09/09/05    x 2     FAMILY HISTORY Family History  Problem Relation Age of Onset  . Asthma Son     GYNECOLOGIC HISTORY:  No LMP recorded. Patient is postmenopausal.     ADVANCED DIRECTIVES:    HEALTH MAINTENANCE: History  Substance Use Topics  . Smoking status: Never Smoker   . Smokeless tobacco: Never Used  . Alcohol Use: Yes     Colonoscopy:  PAP:  Bone density:  Lipid panel:  Allergies  Allergen Reactions  . Tussionex Pennkinetic Er [Hydrocod Polst-Cpm Polst Er] Itching  . Ciprofloxacin Rash    Current Facility-Administered Medications  Medication Dose Route Frequency Provider Last Rate Last Dose  . 0.9 %  sodium chloride infusion   Intravenous Continuous Evlyn Kanner, NP 75 mL/hr at 12/01/14 1051    . acetaminophen (TYLENOL) tablet 650 mg  650 mg Oral Q4H PRN Evlyn Kanner, NP      . alum & mag hydroxide-simeth (MAALOX/MYLANTA) 200-200-20 MG/5ML suspension 60 mL  60 mL  Oral Q4H PRN Evlyn Kanner, NP      . citalopram (CELEXA) tablet 20 mg  20 mg Oral Daily Evlyn Kanner, NP   20 mg at 12/01/14 0926  . enoxaparin (LOVENOX) injection 40 mg  40 mg Subcutaneous Q24H Evlyn Kanner, NP   40 mg at 11/30/14 1609  . fluconazole (DIFLUCAN) tablet 100 mg  100 mg Oral Daily Forest Gleason, MD   100 mg at 12/01/14 0926  . guaiFENesin-dextromethorphan (ROBITUSSIN DM) 100-10 MG/5ML syrup 10 mL  10 mL Oral Q4H PRN Evlyn Kanner, NP   10 mL at 11/30/14 1954  . HYDROcodone-acetaminophen (NORCO/VICODIN) 5-325 MG  per tablet 1-2 tablet  1-2 tablet Oral Q4H PRN Evlyn Kanner, NP      . insulin aspart (novoLOG) injection 0-15 Units  0-15 Units Subcutaneous QAC breakfast Forest Gleason, MD   2 Units at 12/01/14 0854  . levothyroxine (SYNTHROID, LEVOTHROID) tablet 25 mcg  25 mcg Oral QAC breakfast Evlyn Kanner, NP   25 mcg at 12/01/14 0845  . lidocaine (XYLOCAINE) 2 % jelly 1 application  1 application Topical Once Flora Lipps, MD      . methylPREDNISolone sodium succinate (SOLU-MEDROL) 125 mg/2 mL injection 40 mg  40 mg Intravenous Q12H Flora Lipps, MD   40 mg at 12/01/14 0926  . mometasone-formoterol (DULERA) 100-5 MCG/ACT inhaler 2 puff  2 puff Inhalation BID Evlyn Kanner, NP   2 puff at 12/01/14 0844  . morphine 2 MG/ML injection 2 mg  2 mg Intravenous Q2H PRN Evlyn Kanner, NP      . ondansetron [facility]) injection 4 mg  4 mg Intravenous 4 times per day Evlyn Kanner, NP   4 mg at 12/01/14 1315  . pantoprazole (PROTONIX) injection 40 mg  40 mg Intravenous QHS Evlyn Kanner, NP   40 mg at 11/30/14 2306  . piperacillin-tazobactam (ZOSYN) IVPB 4.5 g  4.5 g Intravenous 3 times per day Vira Blanco, RPH   4.5 g at 12/01/14 1316  . senna-docusate (Senokot-S) tablet 1 tablet  1 tablet Oral QHS PRN Evlyn Kanner, NP      . sodium chloride 0.9 % injection 3 mL  3 mL Intravenous PRN Forest Gleason, MD      . sodium chloride 0.9 % injection 3 mL  3 mL Intravenous Q6H Forest Gleason, MD   3 mL at 12/01/14 1051  . vancomycin (VANCOCIN) IVPB 750 mg/150 ml premix  750 mg Intravenous Q24H Vira Blanco, RPH   750 mg at 11/30/14 1609    OBJECTIVE:  Filed Vitals:   12/01/14 1308  BP: 118/70  Pulse: 80  Temp: 97.9 F (36.6 C)  Resp:      Body mass index is 22.5 kg/(m^2).    ECOG FS:2 - Symptomatic, <50% confined to bed  PHYSICAL EXAM: GENERAL:  Patient is in mild distress. MENTAL STATUS:  Alert and oriented to person, place and time. HEAD:    Normocephalic, atraumatic, face symmetric, no  Cushingoid features. EYES:  eyes.pale  Pupils equal round and reactive to light and accomodation.  No conjunctivitis or scleral icterus. ENT:  Oropharynx clear without lesion.  Tongue normal. Mucous membranes moist.  RESPIRATORY: Diminished air entry on the right side rhonchi on both sides.  CARDIOVASCULAR:  Regular rate and rhythm without murmur, rub or gallop. BREAST:  Right breast without masses, skin changes or nipple discharge.  Left breast without masses, skin changes or nipple discharge. ABDOMEN:  Soft, non-tender, with active bowel sounds, and no hepatosplenomegaly.  No masses. BACK:  No CVA tenderness.  No tenderness on percussion of the back or rib cage. SKIN:  No rashes, ulcers or lesions. EXTREMITIES: No edema, no skin discoloration or tenderness.  No palpable cords. LYMPH NODES: No palpable cervical, supraclavicular, axillary or inguinal adenopathy  NEUROLOGICAL: Unremarkable. PSYCH:  Appropriate.   LAB RESULTS:  Admission on 11/29/2014  Component Date Value Ref Range Status  . Sodium 11/29/2014 133* 135 - 145 mmol/L Final  . Potassium 11/29/2014 3.5  3.5 - 5.1 mmol/L Final  . Chloride 11/29/2014 98* 101 - 111 mmol/L Final  . CO2 11/29/2014 26  22 - 32 mmol/L Final  . Glucose, Bld 11/29/2014 93  65 - 99 mg/dL Final  . BUN 11/29/2014 32* 6 - 20 mg/dL Final  . Creatinine, Ser 11/29/2014 1.19* 0.44 - 1.00 mg/dL Final  . Calcium 11/29/2014 9.0  8.9 - 10.3 mg/dL Final  . GFR calc non Af Amer 11/29/2014 45* >60 mL/min Final  . GFR calc Af Amer 11/29/2014 52* >60 mL/min Final   Comment: (NOTE) The eGFR has been calculated using the CKD EPI equation. This calculation has not been validated in all clinical situations. eGFR's persistently <60 mL/min signify possible Chronic Kidney Disease.   . Anion gap 11/29/2014 9  5 - 15 Final  . WBC 11/29/2014 5.5  3.6 - 11.0 K/uL Final  . RBC 11/29/2014 3.49* 3.80 - 5.20 MIL/uL Final  . Hemoglobin 11/29/2014 12.4  12.0 - 16.0 g/dL Final    . HCT 11/29/2014 35.5  35.0 - 47.0 % Final  . MCV 11/29/2014 101.9* 80.0 - 100.0 fL Final  . MCH 11/29/2014 35.6* 26.0 - 34.0 pg Final  . MCHC 11/29/2014 34.9  32.0 - 36.0 g/dL Final  . RDW 11/29/2014 13.7  11.5 - 14.5 % Final  . Platelets 11/29/2014 171  150 - 440 K/uL Final  . Neutrophils Relative % 11/29/2014 81   Final  . Neutro Abs 11/29/2014 4.5  1.4 - 6.5 K/uL Final  . Lymphocytes Relative 11/29/2014 10   Final  . Lymphs Abs 11/29/2014 0.5* 1.0 - 3.6 K/uL Final  . Monocytes Relative 11/29/2014 7   Final  . Monocytes Absolute 11/29/2014 0.4  0.2 - 0.9 K/uL Final  . Eosinophils Relative 11/29/2014 1   Final  . Eosinophils Absolute 11/29/2014 0.0  0 - 0.7 K/uL Final  . Basophils Relative 11/29/2014 1   Final  . Basophils Absolute 11/29/2014 0.0  0 - 0.1 K/uL Final  . Sodium 11/30/2014 135  135 - 145 mmol/L Final  . Potassium 11/30/2014 3.4* 3.5 - 5.1 mmol/L Final  . Chloride 11/30/2014 101  101 - 111 mmol/L Final  . CO2 11/30/2014 23  22 - 32 mmol/L Final  . Glucose, Bld 11/30/2014 158* 65 - 99 mg/dL Final  . BUN 11/30/2014 30* 6 - 20 mg/dL Final  . Creatinine, Ser 11/30/2014 1.15* 0.44 - 1.00 mg/dL Final  . Calcium 11/30/2014 8.6* 8.9 - 10.3 mg/dL Final  . GFR calc non Af Amer 11/30/2014 47* >60 mL/min Final  . GFR calc Af Amer 11/30/2014 54* >60 mL/min Final   Comment: (NOTE) The eGFR has been calculated using the CKD EPI equation. This calculation has not been validated in all clinical situations. eGFR's persistently <60 mL/min signify possible Chronic Kidney Disease.   . Anion gap 11/30/2014 11  5 - 15 Final  . WBC 11/30/2014 3.5* 3.6 - 11.0 K/uL Final  .  RBC 11/30/2014 3.39* 3.80 - 5.20 MIL/uL Final  . Hemoglobin 11/30/2014 11.7* 12.0 - 16.0 g/dL Final  . HCT 11/30/2014 34.2* 35.0 - 47.0 % Final  . MCV 11/30/2014 101.1* 80.0 - 100.0 fL Final  . MCH 11/30/2014 34.4* 26.0 - 34.0 pg Final  . MCHC 11/30/2014 34.0  32.0 - 36.0 g/dL Final  . RDW 11/30/2014 14.0  11.5 -  14.5 % Final  . Platelets 11/30/2014 155  150 - 440 K/uL Final  . Glucose-Capillary 11/29/2014 108* 70 - 99 mg/dL Final  . Glucose-Capillary 11/30/2014 136* 70 - 99 mg/dL Final  . Specimen Description 11/30/2014 BRONCHIAL ALVEOLAR LAVAGE   Final  . Special Requests 11/30/2014 Immunocompromised   Final  . Gram Stain 11/30/2014 PENDING   Incomplete  . Colony Count 11/30/2014 PENDING   Incomplete  . Culture 11/30/2014 NO GROWTH < 24 HOURS   Final  . Report Status 11/30/2014 PENDING   Incomplete  . Specimen Description 11/30/2014 BRONCHIAL ALVEOLAR LAVAGE   Final  . Special Requests 11/30/2014 Immunocompromised   Final  . Report Status 11/30/2014 11/30/2014 FINAL   Final  . [facility] test code 11/30/2014 [id number]   Final  . [facility] test name 11/30/2014 AFB CULTURE AND SMEAR   Final  . Misc [facility] result 11/30/2014 COMMENT   Final   Comment: (NOTE) Test Ordered: [id number] Acid Fast Smear+Culture AFB Specimen Processing        Note:                     BN     Concentration                                               Acid Fast Smear                Negative                  BN   Performed At: [facility] [place], [place] [id number] Lindon Romp MD OB:[id number]   . Specimen Description 11/30/2014 LUNG   Final  . Special Requests 11/30/2014 BAL   Final  . Culture 11/30/2014 NO FUNGUS ISOLATED <24 HOURS   Final  . Report Status 11/30/2014 PENDING   Incomplete  . Glucose-Capillary 11/30/2014 129* 70 - 99 mg/dL Final  . Glucose-Capillary 12/01/2014 139* 65 - 99 mg/dL Final    STUDIES: X-ray Chest Pa And Lateral  11/29/2014   CLINICAL DATA:  History of recurrent diffuse large cell lymphoma. Fever and cough with nausea and vomiting. Recent fall with left arm fracture. Hypoxia.  EXAM: CHEST  2 VIEW  COMPARISON:  09/28/2014 and 06/14/2014  FINDINGS: Left subclavian Port-A-Cath unchanged with tip in the SVC. Lungs are adequately inflated with continued paramediastinal  scarring right worse than left as well as right apical scarring likely due in part to post radiation change. Minimal pleural fluid tracking to the right apex. Slight worsening opacification over the anterior right upper lobe on the lateral film which may be due to infection. Possible small amount right pleural fluid slightly worse. Cardiac silhouette is within normal. There is a known acute displaced left humeral neck fracture extending into the region of the greater tuberosity. Remainder the exam is unchanged.  IMPRESSION: Slight worsening airspace density over the anterior right upper lobe on the lateral film which may  be due to infection. Chronic changes in the paramediastinal regions and right apex. Small amount right pleural fluid likely slightly worse.  Known acute minimally displaced left humeral neck/ head fracture.   Electronically Signed   By: Marin Olp M.D.   On: 11/29/2014 17:16   Ct Angio Chest Pe W/cm &/or Wo Cm  11/29/2014   CLINICAL DATA:  Recurrent large cell lymphoma presenting with shortness of breath, cough, and fever  EXAM: CT ANGIOGRAPHY CHEST WITH CONTRAST  TECHNIQUE: Multidetector CT imaging of the chest was performed using the standard protocol during bolus administration of intravenous contrast. Multiplanar CT image reconstructions and MIPs were obtained to evaluate the vascular anatomy.  CONTRAST:  77m OMNIPAQUE IOHEXOL 350 MG/ML SOLN  COMPARISON:  Chest CT April 18, 2014 and chest radiograph Nov 29, 2014  FINDINGS: There is no demonstrable pulmonary embolus. There is no thoracic aortic aneurysm or dissection.  There is underlying centrilobular emphysematous change. There is loculated effusion on the right with consolidation arising in the right perihilar region with extension into the right upper lobe. Some of this change is felt to be of post radiation therapy etiology. There is moderate volume loss throughout this area. There is patchy airspace consolidation at multiple  sites in the right lower lobe, characterized by several ill-defined semi-solid opacities. There is also patchy atelectasis in the lingula, primarily inferiorly. There is focal volume loss in the left perihilar region which is stable and felt to be primarily of radiation therapy etiology. There is mild lower lobe bronchiectatic change bilaterally.  Thyroid appears unremarkable. There is no apparent adenopathy. The pericardium is not thickened.  In the visualized upper abdomen, there is atherosclerotic change in aorta. There is hepatic steatosis.  There are no blastic or lytic bone lesions. Port-A-Cath tip is in the superior vena cava.  Review of the MIP images confirms the above findings.  IMPRESSION: No demonstrable pulmonary embolus.  Post radiation therapy change in both perihilar regions. Areas of patchy ground-glass type opacity in the right lower lobe, likely representing pneumonia. Atypical presentation of neoplasm is possible; a followup study in 6 day weeks may be advisable to further assess this region.  There is underlying emphysema. There is mild lower lobe bronchiectatic change.  No adenopathy.  Moderate loculated effusion on the right.  Hepatic steatosis.  Port-A-Cath tip in superior vena cava.   Electronically Signed   By: WLowella GripIII M.D.   On: 11/29/2014 17:33    ASSESSMENT: Acute bronchitis and pneumonia.  Possibility of atypical bacterial infection cannot be ruled out.  Patient underwent bronchoscopy on broad-spectrum antibiotic  MEDICAL DECISION MAKING:  Cultures are pending. Taper steroid Continue antibiotics Continue physiotherapy Had antidepressant medication  Patient expressed understanding and was in agreement with this plan. She also understands that She can call clinic at any time with any questions, concerns, or complaints.    No matching staging information was found for the patient.  JForest Gleason MD   12/01/2014 2:30 PM

## 2014-12-01 NOTE — Progress Notes (Signed)
   12/01/14 1000  Clinical Encounter Type  Visited With Patient and family together  Visit Type Initial  Stress Factors  Patient Stress Factors Health changes  Pastoral visit with patient, spouse and minister.  Patient was calm and thanked me for visiting with her.  [place] [phone]

## 2014-12-01 NOTE — Plan of Care (Signed)
Problem: Discharge Progression Outcomes Goal: Other Discharge Outcomes/Goals Outcome: Progressing Plan of care progress:  Pain control: -pt with no complaints of pain, no distress or discomfort noted -continue to monitor pain  -pt on 3L o2 [place], on 2L [place] at home, will wean to baseline -tolerates diet, no complaints of nausea, receiving scheduled nausea meds

## 2014-12-01 NOTE — Plan of Care (Signed)
Problem: Discharge Progression Outcomes Goal: Discharge plan in place and appropriate Outcome: Progressing Patient is from home with husband Hx of Non Hodgkins Lymphoma, HTN, CHF, DM, sickle cell anemia, asthma, continue home meds. Off all chemotherapy at this time. Patient is a DNR Goal: Other Discharge Outcomes/Goals Outcome: Progressing Patient is alert and oriented, emotional. Did not receive her Celexa in AM d/t procedure, states she is emotional when she does not take it. Standby assist to the bathroom, no c/o pain or discomfort. Patient appears sad, withdrawn.

## 2014-12-02 DIAGNOSIS — J9601 Acute respiratory failure with hypoxia: Secondary | ICD-10-CM | POA: Insufficient documentation

## 2014-12-02 LAB — CULTURE, BAL-QUANTITATIVE

## 2014-12-02 LAB — CULTURE, BAL-QUANTITATIVE W GRAM STAIN: Culture: NORMAL

## 2014-12-02 LAB — GLUCOSE, CAPILLARY: Glucose-Capillary: 102 mg/dL — ABNORMAL HIGH (ref 65–99)

## 2014-12-02 LAB — CYTOLOGY - NON PAP

## 2014-12-02 MED ORDER — POTASSIUM CHLORIDE CRYS ER 10 MEQ PO TBCR
10.0000 meq | EXTENDED_RELEASE_TABLET | Freq: Two times a day (BID) | ORAL | Status: DC
  Administered 2014-12-02 – 2014-12-05 (×7): 10 meq via ORAL
  Filled 2014-12-02 (×7): qty 1

## 2014-12-02 MED ORDER — PANTOPRAZOLE SODIUM 40 MG PO TBEC
40.0000 mg | DELAYED_RELEASE_TABLET | Freq: Every day | ORAL | Status: DC
  Administered 2014-12-02 – 2014-12-04 (×3): 40 mg via ORAL
  Filled 2014-12-02 (×3): qty 1

## 2014-12-02 NOTE — Progress Notes (Signed)
[place] @ [facility] Telephone:(336) [phone]  Fax:(336) [place]: 03-Jul-1946  MR#: [id number]  OAC#:[id number]  Patient Care Team: Madelyn Brunner, MD as PCP - General (Unknown Physician Specialty) Madelyn Brunner, MD as Referring Physician (Unknown Physician Specialty) Forest Gleason, MD (Unknown Physician Specialty) Christene Lye, MD (General Surgery)  CHIEF COMPLAINT: 1. 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 Status post 6 cycles of chemotherapy with CHOP9nd involved field radiation treatment 18,00 rads to the mantle field and mediasinal boost 2340. finished radiation and chemotherapy in November of 2000 residual mediastinotomy mass 2 cm 2. In May of 2001 progressive disease with mediastinal mass becoming bigger 8 cm3. The patient underwent 2 cycles of chemotherapy withRituxan, and ICE.(June of 2001) [facility]and high-dose chemotherapy with BEAM finished treatment in October of 2001. patient had significant pancytopenia as well as fever lasting for 3 months after transplant. Recurrent sinus infection and pulmonary infection 3. Patient underwent EUS biopsy of which was negative . (November, 2012) 4. 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 Started on RICE october 2014 6.  Patient is now being considered for high-dose chemotherapy and stem cell support (March, 2015) 7.  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 Patient underwent bronchoscopy on 11/30/14. CT scan reveals that pneumonia is gradually improving on vancomycin and Zosyn.  Cultures are still pending. She reports feeling much better.   REVIEW OF SYSTEMS:   General:  Feeling better than yesterday continues to feel weak and tired PERFORMANCE  HEENT:  No visual changes, runny nose, sore throat, mouth sores or tenderness. Lungs: Shortness of breath.  On 2L of oxygen.  Cough with expectoration. Cardiac:  No chest pain, palpitations, orthopnea, or PND. GI:  No nausea, vomiting, diarrhea, constipation, melena or hematochezia. GU:  No urgency, frequency, dysuria, or hematuria. Musculoskeletal:  No back pain.  No joint pain.  No muscle tenderness. Extremities:  No pain or swelling. Skin:  No rashes or skin changes. Neuro:  No headache, numbness or weakness, balance or coordination issues. Psych:  No mood changes, depression or anxiety. Pain:  No focal pain.  As per HPI. Otherwise, a complete review of systems is negatve.  PAST MEDICAL HISTORY: Past Medical History  Diagnosis Date  . Non Hodgkin's lymphoma   . Asthma   . Acute respiratory failure 11/29/2014  . Multiple myeloma 12/01/2014  . 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 Nasal sinus surgery  08/07/05 09/09/05    x 2     FAMILY HISTORY  Family History  Problem Relation Age of Onset  . Asthma Son     GYNECOLOGIC HISTORY:  No LMP recorded. Patient is postmenopausal.     ADVANCED DIRECTIVES:    HEALTH MAINTENANCE: History  Substance Use Topics  . Smoking status: Never Smoker   . Smokeless tobacco: Never Used  . Alcohol Use: Yes     Colonoscopy:  PAP:  Bone density:  Lipid panel:  Allergies   Allergen Reactions  . Tussionex Pennkinetic Er [Hydrocod Polst-Cpm Polst Er] Itching  . Ciprofloxacin Rash    Current Facility-Administered Medications  Medication Dose Route Frequency Provider Last Rate Last Dose  . 0.9 %  sodium chloride infusion   Intravenous Continuous Evlyn Kanner, NP 75 mL/hr at 12/01/14 2353    . acetaminophen (TYLENOL) tablet 650 mg  650 mg Oral Q4H PRN Evlyn Kanner, NP      . alum & mag hydroxide-simeth (MAALOX/MYLANTA) 200-200-20 MG/5ML suspension 60 mL  60 mL Oral Q4H PRN Evlyn Kanner, NP      . citalopram (CELEXA) tablet 20 mg  20 mg Oral Daily Evlyn Kanner, NP   20 mg at 12/01/14 0926  . enoxaparin (LOVENOX) injection 40 mg  40 mg Subcutaneous Q24H Evlyn Kanner, NP   40 mg at 12/01/14 1518  . fluconazole (DIFLUCAN) tablet 100 mg  100 mg Oral Daily Forest Gleason, MD   100 mg at 12/01/14 0926  . guaiFENesin-dextromethorphan (ROBITUSSIN DM) 100-10 MG/5ML syrup 10 mL  10 mL Oral Q4H PRN Evlyn Kanner, NP   10 mL at 11/30/14 1954  . HYDROcodone-acetaminophen (NORCO/VICODIN) 5-325 MG per tablet 1-2 tablet  1-2 tablet Oral Q4H PRN Evlyn Kanner, NP      . insulin aspart (novoLOG) injection 0-15 Units  0-15 Units Subcutaneous QAC breakfast Forest Gleason, MD   2 Units at 12/01/14 0854  . levothyroxine (SYNTHROID, LEVOTHROID) tablet 25 mcg  25 mcg Oral QAC breakfast Evlyn Kanner, NP   25 mcg at 12/02/14 0807  . lidocaine (XYLOCAINE) 2 % jelly 1 application  1 application Topical Once Flora Lipps, MD      . mometasone-formoterol (DULERA) 100-5 MCG/ACT inhaler 2 puff  2 puff Inhalation BID Evlyn Kanner, NP   2 puff at 12/02/14 0809  . morphine 2 MG/ML injection 2 mg  2 mg Intravenous Q2H PRN Evlyn Kanner, NP      . ondansetron [facility]) injection 4 mg  4 mg Intravenous 4 times per day Evlyn Kanner, NP   4 mg at 12/02/14 0533  . pantoprazole (PROTONIX) EC tablet 40 mg  40 mg Oral QHS Forest Gleason, MD      . piperacillin-tazobactam  (ZOSYN) IVPB 4.5 g  4.5 g Intravenous 3 times per day Vira Blanco, RPH   4.5 g at 12/02/14 0533  . potassium chloride (K-DUR,KLOR-CON) CR tablet 10 mEq  10 mEq Oral BID Evlyn Kanner, NP      . Derrill Memo ON 12/03/2014] predniSONE (DELTASONE) tablet 40 mg  40 mg Oral Q breakfast Forest Gleason, MD      . senna-docusate (Senokot-S) tablet 1 tablet  1 tablet Oral QHS PRN Evlyn Kanner, NP      . sodium chloride 0.9 % injection 3 mL  3 mL Intravenous PRN Forest Gleason, MD      . sodium chloride 0.9 % injection 3 mL  3 mL Intravenous Q6H Forest Gleason, MD   3 mL at 12/02/14 0533  .  temazepam (RESTORIL) capsule 7.5 mg  7.5 mg Oral QHS PRN Lequita Asal, MD   7.5 mg at 12/01/14 2348  . vancomycin (VANCOCIN) IVPB 750 mg/150 ml premix  750 mg Intravenous Q24H Vira Blanco, RPH   750 mg at 11/30/14 1609    OBJECTIVE:  Filed Vitals:   12/02/14 0553  BP: 116/75  Pulse: 73  Temp: 97.7 F (36.5 C)  Resp: 18     Body mass index is 22.5 kg/(m^2).    ECOG FS:2 - Symptomatic, <50% confined to bed  PHYSICAL EXAM: GENERAL:  Patient is dyspneic, has been bathing and is fatigued. MENTAL STATUS:  Alert and oriented to person, place and time. HEAD:    Normocephalic, atraumatic, face symmetric, no Cushingoid features. ENT:  Oropharynx clear without lesion.  Tongue normal. Mucous membranes moist.  RESPIRATORY: Diminished air entry on the right side, rhonchi on both sides.  CARDIOVASCULAR:  Regular rate and rhythm without murmur, rub or gallop. ABDOMEN:  Soft, non-tender, with active bowel sounds, and no hepatosplenomegaly.  No masses. BACK:  No CVA tenderness.  No tenderness on percussion of the back or rib cage. SKIN:  No rashes, ulcers or lesions. EXTREMITIES: No edema, no skin discoloration or tenderness.  No palpable cords. LYMPH NODES: No palpable cervical, supraclavicular, axillary or inguinal adenopathy  NEUROLOGICAL: Unremarkable. PSYCH:  Appropriate.   LAB RESULTS:  Admission on 11/29/2014   Component Date Value Ref Range Status  . Sodium 11/29/2014 133* 135 - 145 mmol/L Final  . Potassium 11/29/2014 3.5  3.5 - 5.1 mmol/L Final  . Chloride 11/29/2014 98* 101 - 111 mmol/L Final  . CO2 11/29/2014 26  22 - 32 mmol/L Final  . Glucose, Bld 11/29/2014 93  65 - 99 mg/dL Final  . BUN 11/29/2014 32* 6 - 20 mg/dL Final  . Creatinine, Ser 11/29/2014 1.19* 0.44 - 1.00 mg/dL Final  . Calcium 11/29/2014 9.0  8.9 - 10.3 mg/dL Final  . GFR calc non Af Amer 11/29/2014 45* >60 mL/min Final  . GFR calc Af Amer 11/29/2014 52* >60 mL/min Final   Comment: (NOTE) The eGFR has been calculated using the CKD EPI equation. This calculation has not been validated in all clinical situations. eGFR's persistently <60 mL/min signify possible Chronic Kidney Disease.   . Anion gap 11/29/2014 9  5 - 15 Final  . WBC 11/29/2014 5.5  3.6 - 11.0 K/uL Final  . RBC 11/29/2014 3.49* 3.80 - 5.20 MIL/uL Final  . Hemoglobin 11/29/2014 12.4  12.0 - 16.0 g/dL Final  . HCT 11/29/2014 35.5  35.0 - 47.0 % Final  . MCV 11/29/2014 101.9* 80.0 - 100.0 fL Final  . MCH 11/29/2014 35.6* 26.0 - 34.0 pg Final  . MCHC 11/29/2014 34.9  32.0 - 36.0 g/dL Final  . RDW 11/29/2014 13.7  11.5 - 14.5 % Final  . Platelets 11/29/2014 171  150 - 440 K/uL Final  . Neutrophils Relative % 11/29/2014 81   Final  . Neutro Abs 11/29/2014 4.5  1.4 - 6.5 K/uL Final  . Lymphocytes Relative 11/29/2014 10   Final  . Lymphs Abs 11/29/2014 0.5* 1.0 - 3.6 K/uL Final  . Monocytes Relative 11/29/2014 7   Final  . Monocytes Absolute 11/29/2014 0.4  0.2 - 0.9 K/uL Final  . Eosinophils Relative 11/29/2014 1   Final  . Eosinophils Absolute 11/29/2014 0.0  0 - 0.7 K/uL Final  . Basophils Relative 11/29/2014 1   Final  . Basophils Absolute 11/29/2014 0.0  0 -  0.1 K/uL Final  . Sodium 11/30/2014 135  135 - 145 mmol/L Final  . Potassium 11/30/2014 3.4* 3.5 - 5.1 mmol/L Final  . Chloride 11/30/2014 101  101 - 111 mmol/L Final  . CO2 11/30/2014 23  22 - 32  mmol/L Final  . Glucose, Bld 11/30/2014 158* 65 - 99 mg/dL Final  . BUN 11/30/2014 30* 6 - 20 mg/dL Final  . Creatinine, Ser 11/30/2014 1.15* 0.44 - 1.00 mg/dL Final  . Calcium 11/30/2014 8.6* 8.9 - 10.3 mg/dL Final  . GFR calc non Af Amer 11/30/2014 47* >60 mL/min Final  . GFR calc Af Amer 11/30/2014 54* >60 mL/min Final   Comment: (NOTE) The eGFR has been calculated using the CKD EPI equation. This calculation has not been validated in all clinical situations. eGFR's persistently <60 mL/min signify possible Chronic Kidney Disease.   . Anion gap 11/30/2014 11  5 - 15 Final  . WBC 11/30/2014 3.5* 3.6 - 11.0 K/uL Final  . RBC 11/30/2014 3.39* 3.80 - 5.20 MIL/uL Final  . Hemoglobin 11/30/2014 11.7* 12.0 - 16.0 g/dL Final  . HCT 11/30/2014 34.2* 35.0 - 47.0 % Final  . MCV 11/30/2014 101.1* 80.0 - 100.0 fL Final  . MCH 11/30/2014 34.4* 26.0 - 34.0 pg Final  . MCHC 11/30/2014 34.0  32.0 - 36.0 g/dL Final  . RDW 11/30/2014 14.0  11.5 - 14.5 % Final  . Platelets 11/30/2014 155  150 - 440 K/uL Final  . Glucose-Capillary 11/29/2014 108* 70 - 99 mg/dL Final  . Glucose-Capillary 11/30/2014 136* 70 - 99 mg/dL Final  . Specimen Description 11/30/2014 BRONCHIAL ALVEOLAR LAVAGE   Final  . Special Requests 11/30/2014 Immunocompromised   Final  . Gram Stain 11/30/2014    Final                   Value:FEW WBC SEEN NO ORGANISMS SEEN   . Colony Count 11/30/2014 PENDING   Incomplete  . Culture 11/30/2014 NO GROWTH < 24 HOURS   Final  . Report Status 11/30/2014 PENDING   Incomplete  . Specimen Description 11/30/2014 BRONCHIAL ALVEOLAR LAVAGE   Final  . Special Requests 11/30/2014 Immunocompromised   Final  . Report Status 11/30/2014 11/30/2014 FINAL   Final  . [facility] test code 11/30/2014 [id number]   Final  . [facility] test name 11/30/2014 AFB CULTURE AND SMEAR   Final  . Misc [facility] result 11/30/2014 COMMENT   Final   Comment: (NOTE) Test Ordered: [id number] Acid Fast Smear+Culture AFB Specimen  Processing        Note:                     BN     Concentration                                               Acid Fast Smear                Negative                  BN   Performed At: [facility] [place], [place] [id number] Lindon Romp MD [id number]   . Specimen Description 11/30/2014 LUNG   Final  . Special Requests 11/30/2014 BAL   Final  . Culture 11/30/2014 NO FUNGUS ISOLATED <24 HOURS   Final  . Report  Status 11/30/2014 PENDING   Incomplete  . Glucose-Capillary 11/30/2014 129* 70 - 99 mg/dL Final  . Glucose-Capillary 12/01/2014 139* 65 - 99 mg/dL Final  . Glucose-Capillary 12/02/2014 102* 65 - 99 mg/dL Final    STUDIES: X-ray Chest Pa And Lateral  11/29/2014   CLINICAL DATA:  History of recurrent diffuse large cell lymphoma. Fever and cough with nausea and vomiting. Recent fall with left arm fracture. Hypoxia.  EXAM: CHEST  2 VIEW  COMPARISON:  09/28/2014 and 06/14/2014  FINDINGS: Left subclavian Port-A-Cath unchanged with tip in the SVC. Lungs are adequately inflated with continued paramediastinal scarring right worse than left as well as right apical scarring likely due in part to post radiation change. Minimal pleural fluid tracking to the right apex. Slight worsening opacification over the anterior right upper lobe on the lateral film which may be due to infection. Possible small amount right pleural fluid slightly worse. Cardiac silhouette is within normal. There is a known acute displaced left humeral neck fracture extending into the region of the greater tuberosity. Remainder the exam is unchanged.  IMPRESSION: Slight worsening airspace density over the anterior right upper lobe on the lateral film which may be due to infection. Chronic changes in the paramediastinal regions and right apex. Small amount right pleural fluid likely slightly worse.  Known acute minimally displaced left humeral neck/ head fracture.   Electronically Signed   By: Marin Olp M.D.   On: 11/29/2014 17:16   Ct Angio Chest Pe W/cm &/or Wo Cm  11/29/2014   CLINICAL DATA:  Recurrent large cell lymphoma presenting with shortness of breath, cough, and fever  EXAM: CT ANGIOGRAPHY CHEST WITH CONTRAST  TECHNIQUE: Multidetector CT imaging of the chest was performed using the standard protocol during bolus administration of intravenous contrast. Multiplanar CT image reconstructions and MIPs were obtained to evaluate the vascular anatomy.  CONTRAST:  29m OMNIPAQUE IOHEXOL 350 MG/ML SOLN  COMPARISON:  Chest CT April 18, 2014 and chest radiograph Nov 29, 2014  FINDINGS: There is no demonstrable pulmonary embolus. There is no thoracic aortic aneurysm or dissection.  There is underlying centrilobular emphysematous change. There is loculated effusion on the right with consolidation arising in the right perihilar region with extension into the right upper lobe. Some of this change is felt to be of post radiation therapy etiology. There is moderate volume loss throughout this area. There is patchy airspace consolidation at multiple sites in the right lower lobe, characterized by several ill-defined semi-solid opacities. There is also patchy atelectasis in the lingula, primarily inferiorly. There is focal volume loss in the left perihilar region which is stable and felt to be primarily of radiation therapy etiology. There is mild lower lobe bronchiectatic change bilaterally.  Thyroid appears unremarkable. There is no apparent adenopathy. The pericardium is not thickened.  In the visualized upper abdomen, there is atherosclerotic change in aorta. There is hepatic steatosis.  There are no blastic or lytic bone lesions. Port-A-Cath tip is in the superior vena cava.  Review of the MIP images confirms the above findings.  IMPRESSION: No demonstrable pulmonary embolus.  Post radiation therapy change in both perihilar regions. Areas of patchy ground-glass type opacity in the right lower lobe, likely  representing pneumonia. Atypical presentation of neoplasm is possible; a followup study in 6 day weeks may be advisable to further assess this region.  There is underlying emphysema. There is mild lower lobe bronchiectatic change.  No adenopathy.  Moderate loculated effusion on the right.  Hepatic  steatosis.  Port-A-Cath tip in superior vena cava.   Electronically Signed   By: Lowella Grip III M.D.   On: 11/29/2014 17:33    ASSESSMENT: Acute bronchitis and pneumonia.    MEDICAL DECISION MAKING:  Possibility of atypical bacterial infection cannot be ruled out.  Patient underwent bronchoscopy on broad-spectrum antibiotics. Cultures are pending.  Will continue with antibiotic therapy as patient is responding well to treatment. Will likely stay in hospital through the weekend as bronchoscopy cultures are still pending.   Patient expressed understanding and was in agreement with this plan. She also understands that She can call clinic at any time with any questions, concerns, or complaints.   Dr. Grayland Ormond was available for consultation and review of plan of care for this patient.  Evlyn Kanner, NP   12/02/2014 9:32 AM

## 2014-12-02 NOTE — Plan of Care (Signed)
Problem: Discharge Progression Outcomes Goal: Discharge plan in place and appropriate Outcome: Progressing Patient is from home with husband Hx of Non Hodgkins Lymphoma, HTN, CHF, DM, sickle cell anemia, asthma, continue home meds. Off all chemotherapy at this time. Patient is a DNR Moderate Fall Risk Goal: Other Discharge Outcomes/Goals Outcome: Progressing Patient is alert and oriented, no c/o pain. Requested sleep medication--Restoril given. No c/o of discomfort. Independent in room, IV fluids continued and antibiotics continued. Scheduled Zofran continued, no N/V.

## 2014-12-02 NOTE — Progress Notes (Signed)
MD notified patient request for sleep medication. MD to put in orders.

## 2014-12-02 NOTE — Plan of Care (Signed)
Problem: Discharge Progression Outcomes Goal: Other Discharge Outcomes/Goals Outcome: Progressing Plan of care progress:  Pain control: -pt with no complaints of pain, no distress or discomfort noted -continue to monitor pain -continues ABT -working with PT today, ambulating in room without difficulty  -pt on 2L [place] and 2L at home -tolerates diet, no complaints of nausea, receiving scheduled nausea meds

## 2014-12-02 NOTE — Progress Notes (Signed)
Physical Therapy Treatment Patient Details Name: Anna Mcbride MRN: [id number] DOB: 07-08-1946 Today's Date: 12/02/2014    History of Present Illness 69 year old white female c MPH of recurrent diffuse large cell lymphoma. Presently off all chemotherapy. Patient came today with progressive weakn27 year old lady what a previous history of recurrent diffuse large cell lymphoma. Presently off all chemotherapy. Patient came today with progressive weakness. Running fever of 101. Cough. Increasing nausea vomiting. Patient recently had traveled to [place]. Patient also fell down and had left upper extremity fracture which is being managed by orthopedic surgeon.    PT Comments    Pt progressing toward goals, and tolerating treatment well, freely ambulating about the room with IV pole. Pt given education on Sup O2 use that conforms with current suggestions from RN, and GP. Pt given ed on LUE AAROM for elbow to prevent discomfort and muscle tightness. Patient presents with impairment of strength, pain, range of motion, and activity tolerance, limiting ability to perform ADL, IADL, and ambulation. Patient will benefit from skilled intervention to address the above impairments and limitations, in order to restore to prior level of function and to decrease caregiver burden.      Follow Up Recommendations  Home health PT     Equipment Recommendations       Recommendations for Other Services       Precautions / Restrictions Precautions Precautions: Shoulder Type of Shoulder Precautions: Pt presenting with L humeral neck/head fracture from a fall 5 weeks ago. Scheduled to begin rehab soon.  Shoulder Interventions: For comfort;At all times    Mobility  Bed Mobility Overal bed mobility: Independent                Transfers Overall transfer level: Independent Equipment used: None                Ambulation/Gait Ambulation/Gait assistance: Modified independent (Device/Increase  time) Ambulation Distance (Feet): 560 Feet Assistive device: None   Gait velocity: 0.63       Stairs Stairs: Yes Stairs assistance: Supervision Stair Management: No rails Number of Stairs: 4 General stair comments: appears general safe; pt expressed high confidence in abilities.   Wheelchair Mobility    Modified Rankin (Stroke Patients Only)       Balance Overall balance assessment: No apparent balance deficits (not formally assessed)                                  Cognition Arousal/Alertness: Awake/alert (Pt moving about the room. ) Behavior During Therapy: WFL for tasks assessed/performed Overall Cognitive Status: Within Functional Limits for tasks assessed                      Exercises      General Comments        Pertinent Vitals/Pain      Home Living                      Prior Function            PT Goals (current goals can now be found in the care plan section) Acute Rehab PT Goals Patient Stated Goal: Return to home with care from husband PT Goal Formulation: With patient Time For Goal Achievement: 12/14/14 Potential to Achieve Goals: Good Progress towards PT goals: Progressing toward goals    Frequency  Min 2X/week    PT Plan  Co-evaluation             End of Session   Activity Tolerance: Patient tolerated treatment well Patient left: in bed     Time: 1032-1047 PT Time Calculation (min) (ACUTE ONLY): 15 min  Charges:  $Therapeutic Activity: 8-22 mins                    G Codes:      Buccola,Allan C 2014/12/28, 11:05 AM Etta Grandchild, PT, DPT, BM

## 2014-12-03 DIAGNOSIS — D630 Anemia in neoplastic disease: Secondary | ICD-10-CM

## 2014-12-03 DIAGNOSIS — J45909 Unspecified asthma, uncomplicated: Secondary | ICD-10-CM

## 2014-12-03 DIAGNOSIS — E786 Lipoprotein deficiency: Secondary | ICD-10-CM

## 2014-12-03 DIAGNOSIS — J189 Pneumonia, unspecified organism: Principal | ICD-10-CM

## 2014-12-03 DIAGNOSIS — Z9221 Personal history of antineoplastic chemotherapy: Secondary | ICD-10-CM

## 2014-12-03 DIAGNOSIS — J9600 Acute respiratory failure, unspecified whether with hypoxia or hypercapnia: Secondary | ICD-10-CM

## 2014-12-03 DIAGNOSIS — J069 Acute upper respiratory infection, unspecified: Secondary | ICD-10-CM

## 2014-12-03 DIAGNOSIS — C8590 Non-Hodgkin lymphoma, unspecified, unspecified site: Secondary | ICD-10-CM

## 2014-12-03 LAB — CBC
HCT: 29 % — ABNORMAL LOW (ref 35.0–47.0)
Hemoglobin: 9.8 g/dL — ABNORMAL LOW (ref 12.0–16.0)
MCH: 34.7 pg — ABNORMAL HIGH (ref 26.0–34.0)
MCHC: 33.8 g/dL (ref 32.0–36.0)
MCV: 102.7 fL — AB (ref 80.0–100.0)
Platelets: 146 10*3/uL — ABNORMAL LOW (ref 150–440)
RBC: 2.82 MIL/uL — ABNORMAL LOW (ref 3.80–5.20)
RDW: 14 % (ref 11.5–14.5)
WBC: 5.7 10*3/uL (ref 3.6–11.0)

## 2014-12-03 LAB — BASIC METABOLIC PANEL
Anion gap: 6 (ref 5–15)
BUN: 21 mg/dL — AB (ref 6–20)
CALCIUM: 8.2 mg/dL — AB (ref 8.9–10.3)
CO2: 24 mmol/L (ref 22–32)
Chloride: 110 mmol/L (ref 101–111)
Creatinine, Ser: 1.06 mg/dL — ABNORMAL HIGH (ref 0.44–1.00)
GFR calc non Af Amer: 52 mL/min — ABNORMAL LOW (ref >60)
GFR, EST AFRICAN AMERICAN: 60 mL/min — AB (ref >60)
Glucose, Bld: 120 mg/dL — ABNORMAL HIGH (ref 65–99)
Potassium: 2.7 mmol/L — CL (ref 3.5–5.1)
SODIUM: 140 mmol/L (ref 135–145)

## 2014-12-03 LAB — VANCOMYCIN, TROUGH: Vancomycin Tr: 6 ug/mL — ABNORMAL LOW (ref 10–20)

## 2014-12-03 MED ORDER — VANCOMYCIN HCL IN DEXTROSE 1-5 GM/200ML-% IV SOLN
1000.0000 mg | INTRAVENOUS | Status: DC
  Administered 2014-12-04 – 2014-12-05 (×2): 1000 mg via INTRAVENOUS
  Filled 2014-12-03 (×3): qty 200

## 2014-12-03 MED ORDER — SODIUM CHLORIDE 0.9 % IV SOLN
Freq: Once | INTRAVENOUS | Status: AC
  Administered 2014-12-03: 09:00:00 via INTRAVENOUS
  Filled 2014-12-03: qty 20

## 2014-12-03 MED ORDER — ONDANSETRON HCL 4 MG/2ML IJ SOLN: 4.0000 mg | Freq: Four times a day (QID) | INTRAMUSCULAR | Status: DC | PRN

## 2014-12-03 NOTE — Plan of Care (Signed)
Problem: Discharge Progression Outcomes Goal: Other Discharge Outcomes/Goals Outcome: Progressing Plan of care progress:  Pain control/pneumonia/bronchitis: -pt with no complaints of pain, no distress or discomfort noted -continue to monitor pain -continues ABT, steriod -afebrile -monitor labs, WBC -ambulating in room without difficulty -pt on 2L of o2 PRN, home o2 PRN -tolerates diet, no complaints of nausea, receiving scheduled nausea meds

## 2014-12-03 NOTE — Plan of Care (Signed)
Problem: Discharge Progression Outcomes Goal: Other Discharge Outcomes/Goals Outcome: Progressing Plan of care progress to goal for: Pain-no c/o pain this shift Hemodynamically-pt continues on IV fluids at 68DP/TE           Complications-pt c/o insomnia, prn meds given with improvement Diet-tolerating diet  Activity-pt up to bathroom unassisted several times per pt

## 2014-12-03 NOTE — Progress Notes (Signed)
[place] @ [facility] Telephone:(336) [phone]  Fax:(336) [place]: [phone]  MR#: [id number]  PFX#:[id number]  Patient Care Team: Madelyn Brunner, MD as PCP - General (Unknown Physician Specialty) Madelyn Brunner, MD as Referring Physician (Unknown Physician Specialty) Forest Gleason, MD (Unknown Physician Specialty) Christene Lye, MD (General Surgery)  CHIEF COMPLAINT: 1. 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 Status post 6 cycles of chemotherapy with CHOP and involved field radiation treatment. Finished radiation and chemotherapy in November of 2000. residual mediastinotomy mass 2 cm 2. In May of 2001 progressive disease with mediastinal mass becoming bigger 8 cm.. The patient underwent 2 cycles of chemotherapy with Rituxan, and ICE.(June of 2001) Followed by 2 more cycles at [facility] with DHAP.and high-dose chemotherapy with BEAM finished treatment in October of 2001. Patient had significant pancytopenia as well as fever lasting for 3 months after transplant. Recurrent sinus infection and pulmonary infection 3. Patientunderwent EUS biopsy of which was negative . (November, 2012) 4. 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 Started on RICE october 2014 6.  Patient is now being considered for high-dose chemotherapy and stem cell support (March, 2015) 7.  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 Patient underwent bronchoscopy on 11/30/14, results negative to date. CT scan reveals that pneumonia is gradually improving on vancomycin and Zosyn.  Patient continues to have cough, but otherwise feels well. She has a good appetite and denies any nausea or vomiting. Patient offers no further specific complaints today.  REVIEW OF SYSTEMS:    General:  Feeling better than yesterday continues to feel weak and fatigued.  Lungs: Shortness of breath.  On 2L of oxygen.  Cough with expectoration. Cardiac:  No chest pain, palpitations, orthopnea, or PND. GI:  No nausea, vomiting, diarrhea, constipation, melena or hematochezia. GU:  No urgency, frequency, dysuria, or hematuria. Musculoskeletal:  No back pain.  No joint pain.  No muscle tenderness. Extremities:  No pain or swelling. Skin:  No rashes or skin changes. Neuro:  No headache, numbness or weakness, balance or coordination issues. Psych:  No mood changes, depression or anxiety. Pain:  No focal pain.  As per HPI. Otherwise, a complete review of systems is negatve.  PAST MEDICAL HISTORY: Past Medical History  Diagnosis Date  . Non Hodgkin's lymphoma   . Asthma   . Acute respiratory failure 11/29/2014  . Multiple myeloma 12/01/2014  . 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 Nasal sinus surgery  08/07/05 09/09/05    x 2     FAMILY HISTORY Family History  Problem Relation Age of Onset  .  Asthma Son     GYNECOLOGIC HISTORY:  No LMP recorded. Patient is postmenopausal.     ADVANCED DIRECTIVES:    HEALTH MAINTENANCE: History  Substance Use Topics  . Smoking status: Never Smoker   . Smokeless tobacco: Never Used  . Alcohol Use: Yes     Colonoscopy:  PAP:  Bone density:  Lipid panel:  Allergies  Allergen Reactions  . Tussionex Pennkinetic Er [Hydrocod Polst-Cpm Polst Er]  Itching  . Ciprofloxacin Rash    Current Facility-Administered Medications  Medication Dose Route Frequency Provider Last Rate Last Dose  . 0.9 %  sodium chloride infusion   Intravenous Continuous Evlyn Kanner, NP 75 mL/hr at 12/03/14 0416    . acetaminophen (TYLENOL) tablet 650 mg  650 mg Oral Q4H PRN Evlyn Kanner, NP      . alum & mag hydroxide-simeth (MAALOX/MYLANTA) 200-200-20 MG/5ML suspension 60 mL  60 mL Oral Q4H PRN Evlyn Kanner, NP      . citalopram (CELEXA) tablet 20 mg  20 mg Oral Daily Evlyn Kanner, NP   20 mg at 12/03/14 0841  . enoxaparin (LOVENOX) injection 40 mg  40 mg Subcutaneous Q24H Evlyn Kanner, NP   40 mg at 12/03/14 1542  . fluconazole (DIFLUCAN) tablet 100 mg  100 mg Oral Daily Forest Gleason, MD   100 mg at 12/03/14 0841  . guaiFENesin-dextromethorphan (ROBITUSSIN DM) 100-10 MG/5ML syrup 10 mL  10 mL Oral Q4H PRN Evlyn Kanner, NP   10 mL at 11/30/14 1954  . HYDROcodone-acetaminophen (NORCO/VICODIN) 5-325 MG per tablet 1-2 tablet  1-2 tablet Oral Q4H PRN Evlyn Kanner, NP      . insulin aspart (novoLOG) injection 0-15 Units  0-15 Units Subcutaneous QAC breakfast Forest Gleason, MD   2 Units at 12/01/14 0854  . levothyroxine (SYNTHROID, LEVOTHROID) tablet 25 mcg  25 mcg Oral QAC breakfast Evlyn Kanner, NP   25 mcg at 12/03/14 0841  . lidocaine (XYLOCAINE) 2 % jelly 1 application  1 application Topical Once Flora Lipps, MD      . mometasone-formoterol (DULERA) 100-5 MCG/ACT inhaler 2 puff  2 puff Inhalation BID Evlyn Kanner, NP   2 puff at 12/03/14 0840  . morphine 2 MG/ML injection 2 mg  2 mg Intravenous Q2H PRN Evlyn Kanner, NP      . ondansetron (ZOFRAN) injection 4 mg  4 mg Intravenous Q6H PRN Lloyd Huger, MD      . pantoprazole (PROTONIX) EC tablet 40 mg  40 mg Oral QHS Forest Gleason, MD   40 mg at 12/02/14 2039  . piperacillin-tazobactam (ZOSYN) IVPB 4.5 g  4.5 g Intravenous 3 times per day Vira Blanco, RPH   4.5 g at  12/03/14 1541  . potassium chloride (K-DUR,KLOR-CON) CR tablet 10 mEq  10 mEq Oral BID Evlyn Kanner, NP   10 mEq at 12/03/14 0841  . predniSONE (DELTASONE) tablet 40 mg  40 mg Oral Q breakfast Forest Gleason, MD   40 mg at 12/03/14 0841  . senna-docusate (Senokot-S) tablet 1 tablet  1 tablet Oral QHS PRN Evlyn Kanner, NP      . sodium chloride 0.9 % injection 3 mL  3 mL Intravenous PRN Forest Gleason, MD      . sodium chloride 0.9 % injection 3 mL  3 mL Intravenous Q6H Forest Gleason, MD   3 mL at 12/03/14 0035  . temazepam (RESTORIL) capsule 7.5 mg  7.5 mg Oral  QHS PRN Lequita Asal, MD   7.5 mg at 12/02/14 2305  . [START ON 12/04/2014] vancomycin (VANCOCIN) IVPB 1000 mg/200 mL premix  1,000 mg Intravenous Q18H Vira Blanco, RPH        OBJECTIVE:  Filed Vitals:   12/03/14 1334  BP: 123/67  Pulse: 80  Temp: 97.9 F (36.6 C)  Resp: 20     Body mass index is 22.5 kg/(m^2).    ECOG FS:2 - Symptomatic, <50% confined to bed  PHYSICAL EXAM: GENERAL:  No acute distress.  MENTAL STATUS:  Alert and oriented to person, place and time. HEAD:    Normocephalic, atraumatic, face symmetric, no Cushingoid features. RESPIRATORY: Diminished air entry on the right side, rhonchi on both sides.   CARDIOVASCULAR:  Regular rate and rhythm without murmur, rub or gallop. ABDOMEN:  Soft, non-tender, with active bowel sounds, and no hepatosplenomegaly.  No masses. BACK:  No CVA tenderness.  No tenderness on percussion of the back or rib cage. SKIN:  No rashes, ulcers or lesions. EXTREMITIES: No edema, no skin discoloration or tenderness.  No palpable cords. LYMPH NODES: No palpable cervical, supraclavicular, axillary or inguinal adenopathy  NEUROLOGICAL: Unremarkable. PSYCH:  Appropriate.   LAB RESULTS:  Admission on 11/29/2014  Component Date Value Ref Range Status  . Sodium 11/29/2014 133* 135 - 145 mmol/L Final  . Potassium 11/29/2014 3.5  3.5 - 5.1 mmol/L Final  . Chloride 11/29/2014 98*  101 - 111 mmol/L Final  . CO2 11/29/2014 26  22 - 32 mmol/L Final  . Glucose, Bld 11/29/2014 93  65 - 99 mg/dL Final  . BUN 11/29/2014 32* 6 - 20 mg/dL Final  . Creatinine, Ser 11/29/2014 1.19* 0.44 - 1.00 mg/dL Final  . Calcium 11/29/2014 9.0  8.9 - 10.3 mg/dL Final  . GFR calc non Af Amer 11/29/2014 45* >60 mL/min Final  . GFR calc Af Amer 11/29/2014 52* >60 mL/min Final   Comment: (NOTE) The eGFR has been calculated using the CKD EPI equation. This calculation has not been validated in all clinical situations. eGFR's persistently <60 mL/min signify possible Chronic Kidney Disease.   . Anion gap 11/29/2014 9  5 - 15 Final  . WBC 11/29/2014 5.5  3.6 - 11.0 K/uL Final  . RBC 11/29/2014 3.49* 3.80 - 5.20 MIL/uL Final  . Hemoglobin 11/29/2014 12.4  12.0 - 16.0 g/dL Final  . HCT 11/29/2014 35.5  35.0 - 47.0 % Final  . MCV 11/29/2014 101.9* 80.0 - 100.0 fL Final  . MCH 11/29/2014 35.6* 26.0 - 34.0 pg Final  . MCHC 11/29/2014 34.9  32.0 - 36.0 g/dL Final  . RDW 11/29/2014 13.7  11.5 - 14.5 % Final  . Platelets 11/29/2014 171  150 - 440 K/uL Final  . Neutrophils Relative % 11/29/2014 81   Final  . Neutro Abs 11/29/2014 4.5  1.4 - 6.5 K/uL Final  . Lymphocytes Relative 11/29/2014 10   Final  . Lymphs Abs 11/29/2014 0.5* 1.0 - 3.6 K/uL Final  . Monocytes Relative 11/29/2014 7   Final  . Monocytes Absolute 11/29/2014 0.4  0.2 - 0.9 K/uL Final  . Eosinophils Relative 11/29/2014 1   Final  . Eosinophils Absolute 11/29/2014 0.0  0 - 0.7 K/uL Final  . Basophils Relative 11/29/2014 1   Final  . Basophils Absolute 11/29/2014 0.0  0 - 0.1 K/uL Final  . Sodium 11/30/2014 135  135 - 145 mmol/L Final  . Potassium 11/30/2014 3.4* 3.5 - 5.1 mmol/L Final  .  Chloride 11/30/2014 101  101 - 111 mmol/L Final  . CO2 11/30/2014 23  22 - 32 mmol/L Final  . Glucose, Bld 11/30/2014 158* 65 - 99 mg/dL Final  . BUN 11/30/2014 30* 6 - 20 mg/dL Final  . Creatinine, Ser 11/30/2014 1.15* 0.44 - 1.00 mg/dL Final    . Calcium 11/30/2014 8.6* 8.9 - 10.3 mg/dL Final  . GFR calc non Af Amer 11/30/2014 47* >60 mL/min Final  . GFR calc Af Amer 11/30/2014 54* >60 mL/min Final   Comment: (NOTE) The eGFR has been calculated using the CKD EPI equation. This calculation has not been validated in all clinical situations. eGFR's persistently <60 mL/min signify possible Chronic Kidney Disease.   . Anion gap 11/30/2014 11  5 - 15 Final  . WBC 11/30/2014 3.5* 3.6 - 11.0 K/uL Final  . RBC 11/30/2014 3.39* 3.80 - 5.20 MIL/uL Final  . Hemoglobin 11/30/2014 11.7* 12.0 - 16.0 g/dL Final  . HCT 11/30/2014 34.2* 35.0 - 47.0 % Final  . MCV 11/30/2014 101.1* 80.0 - 100.0 fL Final  . MCH 11/30/2014 34.4* 26.0 - 34.0 pg Final  . MCHC 11/30/2014 34.0  32.0 - 36.0 g/dL Final  . RDW 11/30/2014 14.0  11.5 - 14.5 % Final  . Platelets 11/30/2014 155  150 - 440 K/uL Final  . Glucose-Capillary 11/29/2014 108* 70 - 99 mg/dL Final  . Glucose-Capillary 11/30/2014 136* 70 - 99 mg/dL Final  . Specimen Description 11/30/2014 BRONCHIAL ALVEOLAR LAVAGE   Final  . Special Requests 11/30/2014 Immunocompromised   Final  . Gram Stain 11/30/2014    Final                   Value:FEW WBC SEEN NO ORGANISMS SEEN   . Culture 11/30/2014 NORMAL RESPIRATORY FLORA REDUCED   Final  . Report Status 11/30/2014 12/02/2014 FINAL   Final  . Specimen Description 11/30/2014 BRONCHIAL ALVEOLAR LAVAGE   Final  . Special Requests 11/30/2014 Immunocompromised   Final  . Report Status 11/30/2014 11/30/2014 FINAL   Final  . CYTOLOGY - NON GYN 11/30/2014    Final                   Value:Cytology - Non PAP CASE: [id number] PATIENT: Anna Mcbride Non-Gyn Cytology Report     SPECIMEN SUBMITTED: A. Lung, left lower lobe bronchial brushing  CLINICAL HISTORY: Previous history of non-Hodgkin lymphoma, no known recurrent disease at present time; acute on chronic lung disease  PRE-OPERATIVE DIAGNOSIS: Pulmonary infection with failure to  resolve  POST-OPERATIVE DIAGNOSIS:      DIAGNOSIS: A. LUNG, LEFT LOWER LOBE; BRONCHIAL BRUSHING: - ACUTE INFLAMMATION. - NEGATIVE FOR MALIGNANT CELLS.  Comment: The sample contains neutrophils, macrophages, and bronchial epithelial cells. No viral cytopathic effects are identified.   GROSS DESCRIPTION: A. Immediate Assessment: Not performed Procedure: bronchoscopy Cytotechnologist: Rivka Barbara and Jeanelle Malling Site: LLL-bronchial brushing  Material submitted: 2  Diff-Quik stained slides No Pap stained slides Saline needle rinses in Cytolyte for ThinPrep No sample for flow cytometry  Gross Descript                         ion: Specimen Labeled: patient's name and medical record number Volume: approximately 30 mL Description: CytoLyt solution, clear to tan with a 0.1 by less than 0.1 by less than 0.1 cm aggregate of tissue fragments Cell block(s): 1   Final Diagnosis performed by Bryan Lemma, MD.  Electronically signed 12/02/2014 4:45:12PM  The electronic signature indicates that the named Attending Pathologist has evaluated the specimen  Technical component performed at [facility], [address], [place], [place] [address] Lab: [phone] Dir: Darrick Penna. Evette Doffing, MD  Professional component performed at [facility], [facility], [place], [place], [place] [address] Lab: [phone] Dir: Dellia Nims. Rubinas, MD    . [facility] test code 11/30/2014 [id number]   Final  . [facility] test name 11/30/2014 AFB CULTURE AND SMEAR   Final  . Misc [facility] result 11/30/2014 COMMENT   Final   Comment: (NOTE) Test Ordered: [id number] Acid Fast Smear+Culture AFB Specimen Processing        Note:                     BN     Concentration                                               Acid Fast Smear                Negative                  BN   Performed At: [facility] [place], [place] [id number] Lindon Romp MD CX:[id number]   .  Specimen Description 11/30/2014 LUNG   Final  . Special Requests 11/30/2014 BAL   Final  . Culture 11/30/2014 NO GROWTH 3 DAYS   Final  . Report Status 11/30/2014 PENDING   Incomplete  . Glucose-Capillary 11/30/2014 129* 70 - 99 mg/dL Final  . Glucose-Capillary 12/01/2014 139* 65 - 99 mg/dL Final  . Glucose-Capillary 12/02/2014 102* 65 - 99 mg/dL Final  . WBC 12/03/2014 5.7  3.6 - 11.0 K/uL Final  . RBC 12/03/2014 2.82* 3.80 - 5.20 MIL/uL Final  . Hemoglobin 12/03/2014 9.8* 12.0 - 16.0 g/dL Final  . HCT 12/03/2014 29.0* 35.0 - 47.0 % Final  . MCV 12/03/2014 102.7* 80.0 - 100.0 fL Final  . MCH 12/03/2014 34.7* 26.0 - 34.0 pg Final  . MCHC 12/03/2014 33.8  32.0 - 36.0 g/dL Final  . RDW 12/03/2014 14.0  11.5 - 14.5 % Final  . Platelets 12/03/2014 146* 150 - 440 K/uL Final  . Sodium 12/03/2014 140  135 - 145 mmol/L Final  . Potassium 12/03/2014 2.7* 3.5 - 5.1 mmol/L Final   Comment: CRITICAL RESULT CALLED TO, READ BACK BY AND VERIFIED WITH AMANDA PEREZ AT 0736 12/03/14 BY LAB   . Chloride 12/03/2014 110  101 - 111 mmol/L Final  . CO2 12/03/2014 24  22 - 32 mmol/L Final  . Glucose, Bld 12/03/2014 120* 65 - 99 mg/dL Final  . BUN 12/03/2014 21* 6 - 20 mg/dL Final  . Creatinine, Ser 12/03/2014 1.06* 0.44 - 1.00 mg/dL Final  . Calcium 12/03/2014 8.2* 8.9 - 10.3 mg/dL Final  . GFR calc non Af Amer 12/03/2014 52* >60 mL/min Final  . GFR calc Af Amer 12/03/2014 60* >60 mL/min Final   Comment: (NOTE) The eGFR has been calculated using the CKD EPI equation. This calculation has not been validated in all clinical situations. eGFR's persistently <60 mL/min signify possible Chronic Kidney Disease.   . Anion gap 12/03/2014 6  5 - 15 Final  . Vancomycin Tr 12/03/2014 6* 10 - 20 ug/mL Final    STUDIES: X-ray Chest Pa And Lateral  11/29/2014  CLINICAL DATA:  History of recurrent diffuse large cell lymphoma. Fever and cough with nausea and vomiting. Recent fall with left arm fracture. Hypoxia.   EXAM: CHEST  2 VIEW  COMPARISON:  09/28/2014 and 06/14/2014  FINDINGS: Left subclavian Port-A-Cath unchanged with tip in the SVC. Lungs are adequately inflated with continued paramediastinal scarring right worse than left as well as right apical scarring likely due in part to post radiation change. Minimal pleural fluid tracking to the right apex. Slight worsening opacification over the anterior right upper lobe on the lateral film which may be due to infection. Possible small amount right pleural fluid slightly worse. Cardiac silhouette is within normal. There is a known acute displaced left humeral neck fracture extending into the region of the greater tuberosity. Remainder the exam is unchanged.  IMPRESSION: Slight worsening airspace density over the anterior right upper lobe on the lateral film which may be due to infection. Chronic changes in the paramediastinal regions and right apex. Small amount right pleural fluid likely slightly worse.  Known acute minimally displaced left humeral neck/ head fracture.   Electronically Signed   By: Marin Olp M.D.   On: 11/29/2014 17:16   Ct Angio Chest Pe W/cm &/or Wo Cm  11/29/2014   CLINICAL DATA:  Recurrent large cell lymphoma presenting with shortness of breath, cough, and fever  EXAM: CT ANGIOGRAPHY CHEST WITH CONTRAST  TECHNIQUE: Multidetector CT imaging of the chest was performed using the standard protocol during bolus administration of intravenous contrast. Multiplanar CT image reconstructions and MIPs were obtained to evaluate the vascular anatomy.  CONTRAST:  22m OMNIPAQUE IOHEXOL 350 MG/ML SOLN  COMPARISON:  Chest CT April 18, 2014 and chest radiograph Nov 29, 2014  FINDINGS: There is no demonstrable pulmonary embolus. There is no thoracic aortic aneurysm or dissection.  There is underlying centrilobular emphysematous change. There is loculated effusion on the right with consolidation arising in the right perihilar region with extension into the  right upper lobe. Some of this change is felt to be of post radiation therapy etiology. There is moderate volume loss throughout this area. There is patchy airspace consolidation at multiple sites in the right lower lobe, characterized by several ill-defined semi-solid opacities. There is also patchy atelectasis in the lingula, primarily inferiorly. There is focal volume loss in the left perihilar region which is stable and felt to be primarily of radiation therapy etiology. There is mild lower lobe bronchiectatic change bilaterally.  Thyroid appears unremarkable. There is no apparent adenopathy. The pericardium is not thickened.  In the visualized upper abdomen, there is atherosclerotic change in aorta. There is hepatic steatosis.  There are no blastic or lytic bone lesions. Port-A-Cath tip is in the superior vena cava.  Review of the MIP images confirms the above findings.  IMPRESSION: No demonstrable pulmonary embolus.  Post radiation therapy change in both perihilar regions. Areas of patchy ground-glass type opacity in the right lower lobe, likely representing pneumonia. Atypical presentation of neoplasm is possible; a followup study in 6 day weeks may be advisable to further assess this region.  There is underlying emphysema. There is mild lower lobe bronchiectatic change.  No adenopathy.  Moderate loculated effusion on the right.  Hepatic steatosis.  Port-A-Cath tip in superior vena cava.   Electronically Signed   By: WLowella GripIII M.D.   On: 11/29/2014 17:33    ASSESSMENT: Acute bronchitis and pneumonia.    MEDICAL DECISION MAKING:   1. Pneumonia:  Possibility of atypical bacterial infection  cannot be ruled out.  Patient underwent bronchoscopy on broad-spectrum antibiotics. Cultures are negative to date. Will continue with antibiotic therapy as patient is responding well to treatment. Will likely stay in hospital through the weekend. 2. Lymphoma: Patient will be considered for additional  chemotherapy once acute symptoms resolve. 3. Hypokalemia: Replace as needed, repeat metabolic panel in morning. 4. Disposition: Continue IV antibiotics and discharged in 2-3 days with follow-up as above.   Lloyd Huger, MD   12/03/2014 5:27 PM

## 2014-12-03 NOTE — Progress Notes (Signed)
ANTIBIOTIC CONSULT NOTE - FOLLOW UP  Pharmacy Consult for Vancomycin and Zosyn Indication: pneumonia  Allergies  Allergen Reactions  . Tussionex Pennkinetic Er [Hydrocod Polst-Cpm Polst Er] Itching  . Ciprofloxacin Rash    Patient Measurements: Height: 5\' 3"  (160 cm) Weight: 127 lb (57.607 kg) IBW/kg (Calculated) : 52.4 Adjusted Body Weight: 54.5  Vital Signs: Temp: 97.9 F (36.6 C) (05/14 1334) Temp Source: Oral (05/14 1334) BP: 123/67 mmHg (05/14 1334) Pulse Rate: 80 (05/14 1334) Intake/Output from previous day: 05/13 0701 - 05/14 0700 In: 600 [P.O.:600] Out: 1250 [Urine:1250] Intake/Output from this shift: Total I/O In: 480 [P.O.:480] Out: 1000 [Urine:1000]  Labs:  Recent Labs  12/03/14 0535  WBC 5.7  HGB 9.8*  PLT 146*  CREATININE 1.06*   Estimated Creatinine Clearance: 40.3 mL/min (by C-G formula based on Cr of 1.06).  Recent Labs  12/03/14 1452  [place] 6*     Microbiology: Recent Results (from the past 720 hour(s))  Culture, bal-quantitative     Status: None   Collection Time: 11/30/14 10:05 AM  Result Value Ref Range Status   Specimen Description BRONCHIAL ALVEOLAR LAVAGE  Final   Special Requests Immunocompromised  Final   Gram Stain FEW WBC SEEN NO ORGANISMS SEEN   Final   Culture NORMAL RESPIRATORY FLORA REDUCED  Final   Report Status 12/02/2014 FINAL  Final  Fungus Culture with Smear     Status: None   Collection Time: 11/30/14 10:05 AM  Result Value Ref Range Status   Specimen Description BRONCHIAL ALVEOLAR LAVAGE  Final   Special Requests Immunocompromised  Final   Report Status 11/30/2014 FINAL  Final  Culture, fungus without smear     Status: None (Preliminary result)   Collection Time: 11/30/14 10:55 AM  Result Value Ref Range Status   Specimen Description LUNG  Final   Special Requests BAL  Final   Culture NO GROWTH 3 DAYS  Final   Report Status PENDING  Incomplete    Anti-infectives    Start     Dose/Rate Route  Frequency Ordered Stop   11/30/14 2000  fluconazole (DIFLUCAN) tablet 100 mg     100 mg Oral Daily 11/30/14 1637     11/30/14 1500  vancomycin (VANCOCIN) IVPB 750 mg/150 ml premix     750 mg 150 mL/hr over 60 Minutes Intravenous Every 24 hours 11/29/14 1652     11/29/14 1600  piperacillin-tazobactam (ZOSYN) IVPB 4.5 g     4.5 g 25 mL/hr over 240 Minutes Intravenous 3 times per day 11/29/14 1446     11/29/14 1500  vancomycin (VANCOCIN) IVPB 1000 mg/200 mL premix     1,000 mg 200 mL/hr over 60 Minutes Intravenous  Once 11/29/14 1448 11/29/14 1855   11/29/14 1415  vancomycin (VANCOCIN) IVPB 1000 mg/200 mL premix  Status:  Discontinued     1,000 mg 200 mL/hr over 60 Minutes Intravenous Every 12 hours 11/29/14 1405 11/29/14 1448      Assessment: 5/14 VT of 6 mcg/ml, subtherapeutic. Currently ordered Vancomycin 750mg  IV q24h. Will increase to Vancomycin 1g IV q18h. Will continue with Zosyn 4.5g IV q8h.  Goal of Therapy:  Vancomycin trough level 15-20 mcg/ml  Plan:  Measure antibiotic drug levels at steady state Follow up culture results  Paulina Fusi, PharmD, BCPS 12/03/2014 5:19 PM

## 2014-12-03 NOTE — Progress Notes (Signed)
MD made aware of pts potassium result, new order for IV potassium placed

## 2014-12-04 LAB — CBC WITH DIFFERENTIAL/PLATELET
Basophils Absolute: 0 10*3/uL (ref 0–0.1)
Basophils Relative: 0 %
EOS PCT: 0 %
Eosinophils Absolute: 0 10*3/uL (ref 0–0.7)
HEMATOCRIT: 32.1 % — AB (ref 35.0–47.0)
HEMOGLOBIN: 10.6 g/dL — AB (ref 12.0–16.0)
Lymphocytes Relative: 15 %
Lymphs Abs: 1.1 10*3/uL (ref 1.0–3.6)
MCH: 34.1 pg — ABNORMAL HIGH (ref 26.0–34.0)
MCHC: 33.2 g/dL (ref 32.0–36.0)
MCV: 102.7 fL — ABNORMAL HIGH (ref 80.0–100.0)
MONOS PCT: 9 %
Monocytes Absolute: 0.6 10*3/uL (ref 0.2–0.9)
NEUTROS ABS: 5.4 10*3/uL (ref 1.4–6.5)
Neutrophils Relative %: 76 %
PLATELETS: 180 10*3/uL (ref 150–440)
RBC: 3.12 MIL/uL — AB (ref 3.80–5.20)
RDW: 14.2 % (ref 11.5–14.5)
WBC: 7.2 10*3/uL (ref 3.6–11.0)

## 2014-12-04 LAB — BASIC METABOLIC PANEL
ANION GAP: 6 (ref 5–15)
BUN: 16 mg/dL (ref 6–20)
CALCIUM: 8.3 mg/dL — AB (ref 8.9–10.3)
CO2: 26 mmol/L (ref 22–32)
CREATININE: 1.03 mg/dL — AB (ref 0.44–1.00)
Chloride: 108 mmol/L (ref 101–111)
GFR calc Af Amer: >60 mL/min (ref >60)
GFR, EST NON AFRICAN AMERICAN: 53 mL/min — AB (ref >60)
GLUCOSE: 87 mg/dL (ref 65–99)
Potassium: 3.2 mmol/L — ABNORMAL LOW (ref 3.5–5.1)
SODIUM: 140 mmol/L (ref 135–145)

## 2014-12-04 LAB — GLUCOSE, CAPILLARY: GLUCOSE-CAPILLARY: 83 mg/dL (ref 65–99)

## 2014-12-04 MED ORDER — POTASSIUM CHLORIDE 10 MEQ/50ML IV SOLN
10.0000 meq | INTRAVENOUS | Status: AC
  Administered 2014-12-04 (×4): 10 meq via INTRAVENOUS
  Filled 2014-12-04 (×4): qty 50

## 2014-12-04 NOTE — Plan of Care (Signed)
Problem: Discharge Progression Outcomes Goal: Other Discharge Outcomes/Goals Outcome: Progressing Plan of care progress to goal for: Pain-no c/o pain this shift Hemodynamically-pt continues on IV fluids at 39RV/UY            Complications-pt c/o insomnia, prn meds given with improvement Diet-tolerating diet   Activity-pt up to bathroom unassisted several times per pt

## 2014-12-04 NOTE — Progress Notes (Signed)
[place] @ [facility] Telephone:(336) [phone]  Fax:([phone]) [place]: January 01, 1946  MR#: [id number]  JYN#:[id number]  Patient Care Team: Madelyn Brunner, MD as PCP - General (Unknown Physician Specialty) Madelyn Brunner, MD as Referring Physician (Unknown Physician Specialty) Forest Gleason, MD (Unknown Physician Specialty) Christene Lye, MD (General Surgery)  CHIEF COMPLAINT: 1. 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 Status post 6 cycles of chemotherapy with CHOP and involved field radiation treatment. Finished radiation and chemotherapy in November of 2000. residual mediastinotomy mass 2 cm 2. In May of 2001 progressive disease with mediastinal mass becoming bigger 8 cm. The patient underwent 2 cycles of chemotherapy with Rituxan, and ICE.(June of 2001) Followed by 2 more cycles at [facility] with DHAP.and high-dose chemotherapy with BEAM finished treatment in October of 2001. Patient had significant pancytopenia as well as fever lasting for 3 months after transplant. Recurrent sinus infection and pulmonary infection 3. Patientunderwent EUS biopsy of which was negative . (November, 2012) 4. 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 Started on RICE october 2014 6.  Patient is now being considered for high-dose chemotherapy and stem cell support (March, 2015) 7.  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 Patient underwent bronchoscopy on 11/30/14, results negative to date. CT scan reveals that pneumonia is gradually improving on vancomycin and Zosyn.  Patient continues to have cough, but states it is improving. She  otherwise feels well. She has a good appetite and denies any nausea or vomiting. Patient offers no further specific complaints today.  REVIEW OF SYSTEMS:    General:  Weak and fatigued.  Lungs:  Cough with expectoration. Cardiac:  No chest pain, palpitations, orthopnea, or PND. GI:  No nausea, vomiting, diarrhea, constipation, melena or hematochezia. GU:  No urgency, frequency, dysuria, or hematuria. Musculoskeletal:  No back pain.  No joint pain.  No muscle tenderness. Extremities:  No pain or swelling. Skin:  No rashes or skin changes. Neuro:  No headache, numbness or weakness, balance or coordination issues. Psych:  No mood changes, depression or anxiety. Pain:  No focal pain.  As per HPI. Otherwise, a complete review of systems is negatve.  PAST MEDICAL HISTORY: Past Medical History  Diagnosis Date  . Non Hodgkin's lymphoma   . Asthma   . Acute respiratory failure 11/29/2014  . Multiple myeloma 12/01/2014  . 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 Nasal sinus surgery  08/07/05 09/09/05    x 2     FAMILY HISTORY Family History  Problem Relation Age of Onset  . Asthma Son     GYNECOLOGIC HISTORY:  No LMP recorded. Patient is postmenopausal.     ADVANCED DIRECTIVES:    HEALTH MAINTENANCE: History  Substance Use Topics  . Smoking status: Never Smoker   . Smokeless tobacco: Never Used  . Alcohol Use: Yes     Colonoscopy:  PAP:  Bone density:  Lipid panel:  Allergies  Allergen Reactions  . Tussionex Pennkinetic Er [Hydrocod Polst-Cpm Polst Er] Itching  . Ciprofloxacin Rash    Current  Facility-Administered Medications  Medication Dose Route Frequency Provider Last Rate Last Dose  . 0.9 %  sodium chloride infusion   Intravenous Continuous Evlyn Kanner, NP 75 mL/hr at 12/04/14 1347    . acetaminophen (TYLENOL) tablet 650 mg  650 mg Oral Q4H PRN Evlyn Kanner, NP      . alum & mag hydroxide-simeth (MAALOX/MYLANTA) 200-200-20 MG/5ML suspension 60 mL  60 mL Oral Q4H PRN Evlyn Kanner, NP      . citalopram (CELEXA) tablet 20 mg  20 mg Oral Daily Evlyn Kanner, NP   20 mg at 12/04/14 0928  . enoxaparin (LOVENOX) injection 40 mg  40 mg Subcutaneous Q24H Evlyn Kanner, NP   40 mg at 12/04/14 1801  . fluconazole (DIFLUCAN) tablet 100 mg  100 mg Oral Daily Forest Gleason, MD   100 mg at 12/04/14 0928  . guaiFENesin-dextromethorphan (ROBITUSSIN DM) 100-10 MG/5ML syrup 10 mL  10 mL Oral Q4H PRN Evlyn Kanner, NP   10 mL at 11/30/14 1954  . HYDROcodone-acetaminophen (NORCO/VICODIN) 5-325 MG per tablet 1-2 tablet  1-2 tablet Oral Q4H PRN Evlyn Kanner, NP      . insulin aspart (novoLOG) injection 0-15 Units  0-15 Units Subcutaneous QAC breakfast Forest Gleason, MD   2 Units at 12/01/14 0854  . levothyroxine (SYNTHROID, LEVOTHROID) tablet 25 mcg  25 mcg Oral QAC breakfast Evlyn Kanner, NP   25 mcg at 12/04/14 0928  . lidocaine (XYLOCAINE) 2 % jelly 1 application  1 application Topical Once Flora Lipps, MD      . mometasone-formoterol (DULERA) 100-5 MCG/ACT inhaler 2 puff  2 puff Inhalation BID Evlyn Kanner, NP   2 puff at 12/04/14 [phone]  . morphine 2 MG/ML injection 2 mg  2 mg Intravenous Q2H PRN Evlyn Kanner, NP      . ondansetron (ZOFRAN) injection 4 mg  4 mg Intravenous Q6H PRN Lloyd Huger, MD      . pantoprazole (PROTONIX) EC tablet 40 mg  40 mg Oral QHS Forest Gleason, MD   40 mg at 12/03/14 2056  . piperacillin-tazobactam (ZOSYN) IVPB 4.5 g  4.5 g Intravenous 3 times per day Vira Blanco, RPH   4.5 g at 12/04/14 1350  . potassium chloride  (K-DUR,KLOR-CON) CR tablet 10 mEq  10 mEq Oral BID Evlyn Kanner, NP   10 mEq at 12/04/14 0928  . potassium chloride 10 mEq in 50 mL *CENTRAL LINE* IVPB  10 mEq Intravenous Q1 Hr x 4 Lloyd Huger, MD      . predniSONE (DELTASONE) tablet 40 mg  40 mg Oral Q breakfast Forest Gleason, MD   40 mg at 12/04/14 0928  . senna-docusate (Senokot-S) tablet 1 tablet  1 tablet Oral QHS PRN Evlyn Kanner, NP      . sodium chloride 0.9 % injection 3 mL  3 mL Intravenous PRN Forest Gleason, MD      . sodium chloride 0.9 % injection 3 mL  3 mL Intravenous Q6H Janak Choksi,  MD   3 mL at 12/03/14 0035  . temazepam (RESTORIL) capsule 7.5 mg  7.5 mg Oral QHS PRN Lequita Asal, MD   7.5 mg at 12/03/14 2057  . vancomycin (VANCOCIN) IVPB 1000 mg/200 mL premix  1,000 mg Intravenous Q18H Vira Blanco, RPH   1,000 mg at 12/04/14 6789    OBJECTIVE:  Filed Vitals:   12/04/14 1434  BP: 119/63  Pulse: 82  Temp: 97.5 F (36.4 C)  Resp: 20     Body mass index is 22.5 kg/(m^2).      ECOG 1  PHYSICAL EXAM: GENERAL:  No acute distress.  MENTAL STATUS:  Alert and oriented to person, place and time. HEAD:    Normocephalic, atraumatic, face symmetric, no Cushingoid features. RESPIRATORY: Diminished air entry on the right side, rhonchi on both sides.   CARDIOVASCULAR:  Regular rate and rhythm without murmur, rub or gallop. ABDOMEN:  Soft, non-tender, with active bowel sounds, and no hepatosplenomegaly.  No masses. BACK:  No CVA tenderness.  No tenderness on percussion of the back or rib cage. SKIN:  No rashes, ulcers or lesions. EXTREMITIES: No edema, no skin discoloration or tenderness.  No palpable cords. LYMPH NODES: No palpable cervical, supraclavicular, axillary or inguinal adenopathy  NEUROLOGICAL: Unremarkable. PSYCH:  Appropriate.   LAB RESULTS:  Admission on 11/29/2014  Component Date Value Ref Range Status  . Sodium 11/29/2014 133* 135 - 145 mmol/L Final  . Potassium 11/29/2014 3.5  3.5 -  5.1 mmol/L Final  . Chloride 11/29/2014 98* 101 - 111 mmol/L Final  . CO2 11/29/2014 26  22 - 32 mmol/L Final  . Glucose, Bld 11/29/2014 93  65 - 99 mg/dL Final  . BUN 11/29/2014 32* 6 - 20 mg/dL Final  . Creatinine, Ser 11/29/2014 1.19* 0.44 - 1.00 mg/dL Final  . Calcium 11/29/2014 9.0  8.9 - 10.3 mg/dL Final  . GFR calc non Af Amer 11/29/2014 45* >60 mL/min Final  . GFR calc Af Amer 11/29/2014 52* >60 mL/min Final   Comment: (NOTE) The eGFR has been calculated using the CKD EPI equation. This calculation has not been validated in all clinical situations. eGFR's persistently <60 mL/min signify possible Chronic Kidney Disease.   . Anion gap 11/29/2014 9  5 - 15 Final  . WBC 11/29/2014 5.5  3.6 - 11.0 K/uL Final  . RBC 11/29/2014 3.49* 3.80 - 5.20 MIL/uL Final  . Hemoglobin 11/29/2014 12.4  12.0 - 16.0 g/dL Final  . HCT 11/29/2014 35.5  35.0 - 47.0 % Final  . MCV 11/29/2014 101.9* 80.0 - 100.0 fL Final  . MCH 11/29/2014 35.6* 26.0 - 34.0 pg Final  . MCHC 11/29/2014 34.9  32.0 - 36.0 g/dL Final  . RDW 11/29/2014 13.7  11.5 - 14.5 % Final  . Platelets 11/29/2014 171  150 - 440 K/uL Final  . Neutrophils Relative % 11/29/2014 81   Final  . Neutro Abs 11/29/2014 4.5  1.4 - 6.5 K/uL Final  . Lymphocytes Relative 11/29/2014 10   Final  . Lymphs Abs 11/29/2014 0.5* 1.0 - 3.6 K/uL Final  . Monocytes Relative 11/29/2014 7   Final  . Monocytes Absolute 11/29/2014 0.4  0.2 - 0.9 K/uL Final  . Eosinophils Relative 11/29/2014 1   Final  . Eosinophils Absolute 11/29/2014 0.0  0 - 0.7 K/uL Final  . Basophils Relative 11/29/2014 1   Final  . Basophils Absolute 11/29/2014 0.0  0 - 0.1 K/uL Final  . Sodium 11/30/2014 135  135 -  145 mmol/L Final  . Potassium 11/30/2014 3.4* 3.5 - 5.1 mmol/L Final  . Chloride 11/30/2014 101  101 - 111 mmol/L Final  . CO2 11/30/2014 23  22 - 32 mmol/L Final  . Glucose, Bld 11/30/2014 158* 65 - 99 mg/dL Final  . BUN 11/30/2014 30* 6 - 20 mg/dL Final  . Creatinine,  Ser 11/30/2014 1.15* 0.44 - 1.00 mg/dL Final  . Calcium 11/30/2014 8.6* 8.9 - 10.3 mg/dL Final  . GFR calc non Af Amer 11/30/2014 47* >60 mL/min Final  . GFR calc Af Amer 11/30/2014 54* >60 mL/min Final   Comment: (NOTE) The eGFR has been calculated using the CKD EPI equation. This calculation has not been validated in all clinical situations. eGFR's persistently <60 mL/min signify possible Chronic Kidney Disease.   . Anion gap 11/30/2014 11  5 - 15 Final  . WBC 11/30/2014 3.5* 3.6 - 11.0 K/uL Final  . RBC 11/30/2014 3.39* 3.80 - 5.20 MIL/uL Final  . Hemoglobin 11/30/2014 11.7* 12.0 - 16.0 g/dL Final  . HCT 11/30/2014 34.2* 35.0 - 47.0 % Final  . MCV 11/30/2014 101.1* 80.0 - 100.0 fL Final  . MCH 11/30/2014 34.4* 26.0 - 34.0 pg Final  . MCHC 11/30/2014 34.0  32.0 - 36.0 g/dL Final  . RDW 11/30/2014 14.0  11.5 - 14.5 % Final  . Platelets 11/30/2014 155  150 - 440 K/uL Final  . Glucose-Capillary 11/29/2014 108* 70 - 99 mg/dL Final  . Glucose-Capillary 11/30/2014 136* 70 - 99 mg/dL Final  . Specimen Description 11/30/2014 BRONCHIAL ALVEOLAR LAVAGE   Final  . Special Requests 11/30/2014 Immunocompromised   Final  . Gram Stain 11/30/2014    Final                   Value:FEW WBC SEEN NO ORGANISMS SEEN   . Culture 11/30/2014 NORMAL RESPIRATORY FLORA REDUCED   Final  . Report Status 11/30/2014 12/02/2014 FINAL   Final  . Specimen Description 11/30/2014 BRONCHIAL ALVEOLAR LAVAGE   Final  . Special Requests 11/30/2014 Immunocompromised   Final  . Report Status 11/30/2014 11/30/2014 FINAL   Final  . CYTOLOGY - NON GYN 11/30/2014    Final                   Value:Cytology - Non PAP CASE: [id number] PATIENT: Andretta Signor Non-Gyn Cytology Report     SPECIMEN SUBMITTED: A. Lung, left lower lobe bronchial brushing  CLINICAL HISTORY: Previous history of non-Hodgkin lymphoma, no known recurrent disease at present time; acute on chronic lung disease  PRE-OPERATIVE DIAGNOSIS: Pulmonary  infection with failure to resolve  POST-OPERATIVE DIAGNOSIS:      DIAGNOSIS: A. LUNG, LEFT LOWER LOBE; BRONCHIAL BRUSHING: - ACUTE INFLAMMATION. - NEGATIVE FOR MALIGNANT CELLS.  Comment: The sample contains neutrophils, macrophages, and bronchial epithelial cells. No viral cytopathic effects are identified.   GROSS DESCRIPTION: A. Immediate Assessment: Not performed Procedure: bronchoscopy Cytotechnologist: Rivka Barbara and Jeanelle Malling Site: LLL-bronchial brushing  Material submitted: 2  Diff-Quik stained slides No Pap stained slides Saline needle rinses in Cytolyte for ThinPrep No sample for flow cytometry  Gross Descript                         ion: Specimen Labeled: patient's name and medical record number Volume: approximately 30 mL Description: CytoLyt solution, clear to tan with a 0.1 by less than 0.1 by less than 0.1 cm aggregate of tissue fragments Cell block(s): 1  Final Diagnosis performed by Bryan Lemma, MD.  Electronically signed 12/02/2014 4:45:12PM    The electronic signature indicates that the named Attending Pathologist has evaluated the specimen  Technical component performed at [facility], [address], [place], [place] [address] Lab: [phone] Dir: Darrick Penna. Evette Doffing, MD  Professional component performed at [facility], [facility], [place], [place], [place] [address] Lab: [phone] Dir: Dellia Nims. Rubinas, MD    . [facility] test code 11/30/2014 [id number]   Final  . [facility] test name 11/30/2014 AFB CULTURE AND SMEAR   Final  . Misc [facility] result 11/30/2014 COMMENT   Final   Comment: (NOTE) Test Ordered: [id number] Acid Fast Smear+Culture AFB Specimen Processing        Note:                     BN     Concentration                                               Acid Fast Smear                Negative                  BN   Performed At: [facility] [place], [place] [id number] Lindon Romp MD  DJ:[id number]   . Specimen Description 11/30/2014 LUNG   Final  . Special Requests 11/30/2014 BAL   Final  . Culture 11/30/2014 NO GROWTH 3 DAYS   Final  . Report Status 11/30/2014 PENDING   Incomplete  . Glucose-Capillary 11/30/2014 129* 70 - 99 mg/dL Final  . Glucose-Capillary 12/01/2014 139* 65 - 99 mg/dL Final  . Glucose-Capillary 12/02/2014 102* 65 - 99 mg/dL Final  . WBC 12/03/2014 5.7  3.6 - 11.0 K/uL Final  . RBC 12/03/2014 2.82* 3.80 - 5.20 MIL/uL Final  . Hemoglobin 12/03/2014 9.8* 12.0 - 16.0 g/dL Final  . HCT 12/03/2014 29.0* 35.0 - 47.0 % Final  . MCV 12/03/2014 102.7* 80.0 - 100.0 fL Final  . MCH 12/03/2014 34.7* 26.0 - 34.0 pg Final  . MCHC 12/03/2014 33.8  32.0 - 36.0 g/dL Final  . RDW 12/03/2014 14.0  11.5 - 14.5 % Final  . Platelets 12/03/2014 146* 150 - 440 K/uL Final  . Sodium 12/03/2014 140  135 - 145 mmol/L Final  . Potassium 12/03/2014 2.7* 3.5 - 5.1 mmol/L Final   Comment: CRITICAL RESULT CALLED TO, READ BACK BY AND VERIFIED WITH AMANDA PEREZ AT 0736 12/03/14 BY LAB   . Chloride 12/03/2014 110  101 - 111 mmol/L Final  . CO2 12/03/2014 24  22 - 32 mmol/L Final  . Glucose, Bld 12/03/2014 120* 65 - 99 mg/dL Final  . BUN 12/03/2014 21* 6 - 20 mg/dL Final  . Creatinine, Ser 12/03/2014 1.06* 0.44 - 1.00 mg/dL Final  . Calcium 12/03/2014 8.2* 8.9 - 10.3 mg/dL Final  . GFR calc non Af Amer 12/03/2014 52* >60 mL/min Final  . GFR calc Af Amer 12/03/2014 60* >60 mL/min Final   Comment: (NOTE) The eGFR has been calculated using the CKD EPI equation. This calculation has not been validated in all clinical situations. eGFR's persistently <60 mL/min signify possible Chronic Kidney Disease.   . Anion gap 12/03/2014 6  5 - 15 Final  . Vancomycin Tr 12/03/2014 6* 10 - 20  ug/mL Final  . WBC 12/04/2014 7.2  3.6 - 11.0 K/uL Final  . RBC 12/04/2014 3.12* 3.80 - 5.20 MIL/uL Final  . Hemoglobin 12/04/2014 10.6* 12.0 - 16.0 g/dL Final  . HCT 12/04/2014 32.1* 35.0 - 47.0 %  Final  . MCV 12/04/2014 102.7* 80.0 - 100.0 fL Final  . MCH 12/04/2014 34.1* 26.0 - 34.0 pg Final  . MCHC 12/04/2014 33.2  32.0 - 36.0 g/dL Final  . RDW 12/04/2014 14.2  11.5 - 14.5 % Final  . Platelets 12/04/2014 180  150 - 440 K/uL Final  . Neutrophils Relative % 12/04/2014 76   Final  . Neutro Abs 12/04/2014 5.4  1.4 - 6.5 K/uL Final  . Lymphocytes Relative 12/04/2014 15   Final  . Lymphs Abs 12/04/2014 1.1  1.0 - 3.6 K/uL Final  . Monocytes Relative 12/04/2014 9   Final  . Monocytes Absolute 12/04/2014 0.6  0.2 - 0.9 K/uL Final  . Eosinophils Relative 12/04/2014 0   Final  . Eosinophils Absolute 12/04/2014 0.0  0 - 0.7 K/uL Final  . Basophils Relative 12/04/2014 0   Final  . Basophils Absolute 12/04/2014 0.0  0 - 0.1 K/uL Final  . Sodium 12/04/2014 140  135 - 145 mmol/L Final  . Potassium 12/04/2014 3.2* 3.5 - 5.1 mmol/L Final  . Chloride 12/04/2014 108  101 - 111 mmol/L Final  . CO2 12/04/2014 26  22 - 32 mmol/L Final  . Glucose, Bld 12/04/2014 87  65 - 99 mg/dL Final  . BUN 12/04/2014 16  6 - 20 mg/dL Final  . Creatinine, Ser 12/04/2014 1.03* 0.44 - 1.00 mg/dL Final  . Calcium 12/04/2014 8.3* 8.9 - 10.3 mg/dL Final  . GFR calc non Af Amer 12/04/2014 53* >60 mL/min Final  . GFR calc Af Amer 12/04/2014 >60  >60 mL/min Final   Comment: (NOTE) The eGFR has been calculated using the CKD EPI equation. This calculation has not been validated in all clinical situations. eGFR's persistently <60 mL/min signify possible Chronic Kidney Disease.   . Anion gap 12/04/2014 6  5 - 15 Final  . Glucose-Capillary 12/04/2014 83  65 - 99 mg/dL Final  . Comment 1 12/04/2014 Notify RN   Final    STUDIES: X-ray Chest Pa And Lateral  11/29/2014   CLINICAL DATA:  History of recurrent diffuse large cell lymphoma. Fever and cough with nausea and vomiting. Recent fall with left arm fracture. Hypoxia.  EXAM: CHEST  2 VIEW  COMPARISON:  09/28/2014 and 06/14/2014  FINDINGS: Left subclavian Port-A-Cath  unchanged with tip in the SVC. Lungs are adequately inflated with continued paramediastinal scarring right worse than left as well as right apical scarring likely due in part to post radiation change. Minimal pleural fluid tracking to the right apex. Slight worsening opacification over the anterior right upper lobe on the lateral film which may be due to infection. Possible small amount right pleural fluid slightly worse. Cardiac silhouette is within normal. There is a known acute displaced left humeral neck fracture extending into the region of the greater tuberosity. Remainder the exam is unchanged.  IMPRESSION: Slight worsening airspace density over the anterior right upper lobe on the lateral film which may be due to infection. Chronic changes in the paramediastinal regions and right apex. Small amount right pleural fluid likely slightly worse.  Known acute minimally displaced left humeral neck/ head fracture.   Electronically Signed   By: Marin Olp M.D.   On: 11/29/2014 17:16   Ct Angio Chest  Pe W/cm &/or Wo Cm  11/29/2014   CLINICAL DATA:  Recurrent large cell lymphoma presenting with shortness of breath, cough, and fever  EXAM: CT ANGIOGRAPHY CHEST WITH CONTRAST  TECHNIQUE: Multidetector CT imaging of the chest was performed using the standard protocol during bolus administration of intravenous contrast. Multiplanar CT image reconstructions and MIPs were obtained to evaluate the vascular anatomy.  CONTRAST:  76m OMNIPAQUE IOHEXOL 350 MG/ML SOLN  COMPARISON:  Chest CT April 18, 2014 and chest radiograph Nov 29, 2014  FINDINGS: There is no demonstrable pulmonary embolus. There is no thoracic aortic aneurysm or dissection.  There is underlying centrilobular emphysematous change. There is loculated effusion on the right with consolidation arising in the right perihilar region with extension into the right upper lobe. Some of this change is felt to be of post radiation therapy etiology. There is  moderate volume loss throughout this area. There is patchy airspace consolidation at multiple sites in the right lower lobe, characterized by several ill-defined semi-solid opacities. There is also patchy atelectasis in the lingula, primarily inferiorly. There is focal volume loss in the left perihilar region which is stable and felt to be primarily of radiation therapy etiology. There is mild lower lobe bronchiectatic change bilaterally.  Thyroid appears unremarkable. There is no apparent adenopathy. The pericardium is not thickened.  In the visualized upper abdomen, there is atherosclerotic change in aorta. There is hepatic steatosis.  There are no blastic or lytic bone lesions. Port-A-Cath tip is in the superior vena cava.  Review of the MIP images confirms the above findings.  IMPRESSION: No demonstrable pulmonary embolus.  Post radiation therapy change in both perihilar regions. Areas of patchy ground-glass type opacity in the right lower lobe, likely representing pneumonia. Atypical presentation of neoplasm is possible; a followup study in 6 day weeks may be advisable to further assess this region.  There is underlying emphysema. There is mild lower lobe bronchiectatic change.  No adenopathy.  Moderate loculated effusion on the right.  Hepatic steatosis.  Port-A-Cath tip in superior vena cava.   Electronically Signed   By: WLowella GripIII M.D.   On: 11/29/2014 17:33    ASSESSMENT: Acute bronchitis and pneumonia.    MEDICAL DECISION MAKING:   1. Pneumonia:  Possibility of atypical bacterial infection cannot be ruled out.  Patient underwent bronchoscopy on broad-spectrum antibiotics. Cultures are negative to date. Will continue with antibiotic therapy as patient is responding well to treatment. Will likely stay in hospital through the weekend. 2. Lymphoma: Patient will be considered for additional chemotherapy once acute symptoms resolve. 3. Hypokalemia: Replace as needed, 40 meq IV potassium  given today. Repeat metabolic panel in morning. 4. Disposition: Continue IV antibiotics, possible d/c in morning.  TLloyd Huger MD   12/04/2014 7:09 PM

## 2014-12-04 NOTE — Plan of Care (Signed)
Problem: Discharge Progression Outcomes Goal: Other Discharge Outcomes/Goals Outcome: Progressing Plan of care to goal. Pt  amb in room/ to br and out in hall. tol well. Small amt  Blood noted in stool.  Voiding well.  No c/o pain this shift.  No injury.  Possible discharge home tomorrow if cont to progress.

## 2014-12-05 ENCOUNTER — Telehealth: Payer: Self-pay | Admitting: *Deleted

## 2014-12-05 ENCOUNTER — Encounter: Payer: Self-pay | Admitting: Oncology

## 2014-12-05 DIAGNOSIS — E876 Hypokalemia: Secondary | ICD-10-CM

## 2014-12-05 LAB — BASIC METABOLIC PANEL
Anion gap: 5 (ref 5–15)
BUN: 17 mg/dL (ref 6–20)
CO2: 27 mmol/L (ref 22–32)
CREATININE: 1.07 mg/dL — AB (ref 0.44–1.00)
Calcium: 8.5 mg/dL — ABNORMAL LOW (ref 8.9–10.3)
Chloride: 109 mmol/L (ref 101–111)
GFR, EST AFRICAN AMERICAN: 59 mL/min — AB (ref >60)
GFR, EST NON AFRICAN AMERICAN: 51 mL/min — AB (ref >60)
GLUCOSE: 95 mg/dL (ref 65–99)
POTASSIUM: 3.6 mmol/L (ref 3.5–5.1)
Sodium: 141 mmol/L (ref 135–145)

## 2014-12-05 LAB — GLUCOSE, CAPILLARY: Glucose-Capillary: 82 mg/dL (ref 65–99)

## 2014-12-05 MED ORDER — TEMAZEPAM 7.5 MG PO CAPS: 7.5000 mg | ORAL_CAPSULE | Freq: Every evening | ORAL | Status: DC | PRN

## 2014-12-05 MED ORDER — SENNOSIDES-DOCUSATE SODIUM 8.6-50 MG PO TABS: 1.0000 | ORAL_TABLET | Freq: Every evening | ORAL | Status: DC | PRN

## 2014-12-05 MED ORDER — PREDNISONE 10 MG PO TABS: ORAL_TABLET | ORAL | Status: DC

## 2014-12-05 MED ORDER — PANTOPRAZOLE SODIUM 40 MG PO TBEC: 40.0000 mg | DELAYED_RELEASE_TABLET | Freq: Every day | ORAL | Status: DC

## 2014-12-05 NOTE — Plan of Care (Signed)
Problem: Discharge Progression Outcomes Goal: Discharge plan in place and appropriate Individualized Pt lives at home with her husband Moderate fall risk Hx of Non Hodgkin's Lymphoma, HTN, CHF, DM controlled by home meds

## 2014-12-05 NOTE — Discharge Summary (Signed)
Physician Discharge Summary  Patient ID: Anna Mcbride MRN: [id number] DOB/AGE: [id number] 71 y.o.  Admit date: 11/29/2014 Discharge date: 12/05/2014  Admission Diagnoses:  Discharge Diagnoses:  Active Problems:   Acute respiratory failure   SOB (shortness of breath)   Pneumonia   Acute respiratory failure with hypoxia   Discharged Condition:  Improved Hospital Course: During hospital stay patient was started on broad-spectrum antibiotic with Zosyn and vancomycin.  Patient underwent bronchoscopy.  No evidence of any malignancy was found.  All the cultures including fungal cultures were reported negative. Patient's overall condition improved.  During the hospital stay patient also developed hypokalemia and was corrected.  Patient would be discharged on potassium supplements.  Physiotherapy help with ambulating patient  Consults:  Dr. Mortimer Fries from pulmonary department  Significant Diagnostic Studies: endoscopy Bronchoscopy and IV antibiotics Treatments:  IV antibiotic with Zosyn and vancomycin Bronchoscopy Aggressive bronchodilator therapy Physiotherapy Discharge Exam: Blood pressure 138/71, pulse 76, temperature 98 F (36.7 C), temperature source Oral, resp. rate 16, height 5\' 3"  (1.6 m), weight 127 lb (57.607 kg), SpO2 97 %. On discharge patient's physical condition has improved for physical examination refer to my note  Disposition:   Discharge Instructions    For home use only DME oxygen    Complete by:  As directed   Mode or (Route):  Nasal cannula  Liters per Minute:  2  Oxygen delivery system:  Gas     Increase activity slowly    Complete by:  As directed             Medication List    STOP taking these medications        acetaminophen 500 MG tablet  Commonly known as:  TYLENOL     amoxicillin-clavulanate 875-125 MG per tablet  Commonly known as:  AUGMENTIN     diphenhydramine-acetaminophen 25-500 MG Tabs  Commonly known as:  TYLENOL PM     omeprazole 20  MG capsule  Commonly known as:  PRILOSEC      TAKE these medications        ADVAIR DISKUS 250-50 MCG/DOSE Aepb  Generic drug:  Fluticasone-Salmeterol  Inhale 1 puff into the lungs 2 (two) times daily.     albuterol 108 (90 BASE) MCG/ACT inhaler  Commonly known as:  PROVENTIL HFA;VENTOLIN HFA  Inhale 1 puff into the lungs every 6 (six) hours as needed for wheezing or shortness of breath.     aspirin EC 81 MG tablet  Take 81 mg by mouth daily.     cetirizine 10 MG tablet  Commonly known as:  ZYRTEC  Take 10 mg by mouth daily as needed for allergies.     citalopram 20 MG tablet  Commonly known as:  CELEXA  Take 20 mg by mouth daily.     guaiFENesin 600 MG 12 hr tablet  Commonly known as:  MUCINEX  Take 600 mg by mouth 2 (two) times daily as needed for to loosen phlegm.     levothyroxine 25 MCG tablet  Commonly known as:  SYNTHROID, LEVOTHROID  Take 25 mcg by mouth daily.     pantoprazole 40 MG tablet  Commonly known as:  PROTONIX  Take 1 tablet (40 mg total) by mouth at bedtime.     potassium chloride SA 20 MEQ tablet  Commonly known as:  K-DUR,KLOR-CON  Take 20 mEq by mouth 2 (two) times daily.     predniSONE 10 MG tablet  Commonly known as:  DELTASONE  6 tabs day one,  5 tabs day 2, 4 tabs day 3, 3 tabs day 4, 2 tabs day 5 1 tab day 6 then discontinue     senna-docusate 8.6-50 MG per tablet  Commonly known as:  Senokot-S  Take 1 tablet by mouth at bedtime as needed for mild constipation.     temazepam 7.5 MG capsule  Commonly known as:  RESTORIL  Take 1 capsule (7.5 mg total) by mouth at bedtime as needed for sleep.        See me in  me in 2 weeks Signed: Forest Gleason 12/05/2014, 9:22 AM

## 2014-12-05 NOTE — Progress Notes (Signed)
[place] @ [facility] [phone]  Fax:([phone]) [place]: 1946-06-11  MR#: [id number]  FTD#:[id number]  Patient Care Team: Madelyn Brunner, MD as PCP - General (Unknown Physician Specialty) Madelyn Brunner, MD as Referring Physician (Unknown Physician Specialty) Forest Gleason, MD (Unknown Physician Specialty) Christene Lye, MD (General Surgery)  CHIEF COMPLAINT: No chief complaint on file.    No history exists.    Oncology Flowsheet 12/02/2014 12/03/2014 12/03/2014 12/03/2014 12/03/2014 12/04/2014 12/05/2014  enoxaparin (LOVENOX) [place]   40 mg       40 mg -  methylPREDNISolone sodium succinate 125 mg/2 mL (SOLU-MEDROL) IV - - - - - - -  ondansetron (ZOFRAN) IV 4 mg 4 mg 4 mg 4 mg 4 mg - -  predniSONE (DELTASONE) PO   40 mg       40 mg 40 mg    INTERVAL HISTORY: REVIEW OF SYSTEMS:  Patient is feeling somewhat tired.  Was kept awake all night checking potassium level.  This morning potassium is 3.6.  Patient is making good progress with improvement in cough and oxygen level GENERAL:  Feeling better than yesterday continues to feel weak and tired PERFORMANCE STATUS (ECOG): 02 HEENT:  No visual changes, runny nose, sore throat, mouth sores or tenderness. Lungs: Shortness of breath.  On oxygen.  Cough with expectoration. Cardiac:  No chest pain, palpitations, orthopnea, or PND. GI:  No nausea, vomiting, diarrhea, constipation, melena or hematochezia. GU:  No urgency, frequency, dysuria, or hematuria. Musculoskeletal:  No back pain.  No joint pain.  No muscle tenderness. Extremities:  No pain or swelling. Skin:  No rashes or skin changes. Neuro:  No headache, numbness or weakness, balance or coordination issues. Endocrine:  No diabetes, thyroid issues, hot flashes  Psych:  No mood changes, depression or anxiety. Pain:  No focal pain. Review of systems:  All other systems reviewed and found to be negative..    As per HPI. Otherwise, a complete review  of systems is negatve.  PAST MEDICAL HISTORY: Past Medical History  Diagnosis Date  . Non Hodgkin's lymphoma   . Asthma   . Acute respiratory failure 11/29/2014  . Multiple myeloma 12/01/2014  . 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 Nasal sinus surgery  08/07/05 09/09/05    x 2     FAMILY HISTORY Family History  Problem Relation Age of Onset  . Asthma Son     GYNECOLOGIC HISTORY:  No LMP recorded. Patient is postmenopausal.     ADVANCED DIRECTIVES:    HEALTH MAINTENANCE: History  Substance Use Topics  . Smoking status: Never Smoker   . Smokeless tobacco: Never Used  . Alcohol Use: Yes     Colonoscopy:  PAP:  Bone density:  Lipid panel:  Allergies  Allergen Reactions  . Tussionex Pennkinetic Er [Hydrocod Polst-Cpm Polst Er] Itching  . Ciprofloxacin Rash    Current Facility-Administered Medications  Medication Dose Route Frequency Provider Last Rate Last Dose  . 0.9 %  sodium chloride infusion   Intravenous Continuous Evlyn Kanner, NP 75 mL/hr at 12/04/14 1347    . acetaminophen (TYLENOL) tablet 650 mg  650 mg Oral Q4H PRN Evlyn Kanner, NP      . alum & mag hydroxide-simeth (MAALOX/MYLANTA) 200-200-20 MG/5ML suspension 60 mL  60 mL Oral Q4H PRN Evlyn Kanner,  NP      . citalopram (CELEXA) tablet 20 mg  20 mg Oral Daily Evlyn Kanner, NP   20 mg at 12/05/14 0824  . enoxaparin (LOVENOX) injection 40 mg  40 mg Subcutaneous Q24H Evlyn Kanner, NP   40 mg at 12/04/14 1801  . fluconazole (DIFLUCAN) tablet 100 mg  100 mg Oral Daily Forest Gleason, MD   100 mg at 12/05/14 0825  . guaiFENesin-dextromethorphan (ROBITUSSIN DM) 100-10 MG/5ML syrup 10 mL  10 mL Oral Q4H PRN Evlyn Kanner, NP   10 mL at 11/30/14 1954  . HYDROcodone-acetaminophen (NORCO/VICODIN) 5-325 MG per tablet 1-2 tablet  1-2 tablet Oral Q4H PRN Evlyn Kanner, NP      .  insulin aspart (novoLOG) injection 0-15 Units  0-15 Units Subcutaneous QAC breakfast Forest Gleason, MD   2 Units at 12/01/14 0854  . levothyroxine (SYNTHROID, LEVOTHROID) tablet 25 mcg  25 mcg Oral QAC breakfast Evlyn Kanner, NP   25 mcg at 12/05/14 0825  . lidocaine (XYLOCAINE) 2 % jelly 1 application  1 application Topical Once Flora Lipps, MD      . mometasone-formoterol (DULERA) 100-5 MCG/ACT inhaler 2 puff  2 puff Inhalation BID Evlyn Kanner, NP   2 puff at 12/05/14 0825  . morphine 2 MG/ML injection 2 mg  2 mg Intravenous Q2H PRN Evlyn Kanner, NP      . ondansetron (ZOFRAN) injection 4 mg  4 mg Intravenous Q6H PRN Lloyd Huger, MD      . pantoprazole (PROTONIX) EC tablet 40 mg  40 mg Oral QHS Forest Gleason, MD   40 mg at 12/04/14 2200  . piperacillin-tazobactam (ZOSYN) IVPB 4.5 g  4.5 g Intravenous 3 times per day Vira Blanco, RPH   4.5 g at 12/05/14 0507  . potassium chloride (K-DUR,KLOR-CON) CR tablet 10 mEq  10 mEq Oral BID Evlyn Kanner, NP   10 mEq at 12/05/14 0825  . predniSONE (DELTASONE) tablet 40 mg  40 mg Oral Q breakfast Forest Gleason, MD   40 mg at 12/05/14 0824  . senna-docusate (Senokot-S) tablet 1 tablet  1 tablet Oral QHS PRN Evlyn Kanner, NP      . sodium chloride 0.9 % injection 3 mL  3 mL Intravenous PRN Forest Gleason, MD      . sodium chloride 0.9 % injection 3 mL  3 mL Intravenous Q6H Forest Gleason, MD   3 mL at 12/03/14 0035  . temazepam (RESTORIL) capsule 7.5 mg  7.5 mg Oral QHS PRN Lequita Asal, MD   7.5 mg at 12/04/14 2300  . vancomycin (VANCOCIN) IVPB 1000 mg/200 mL premix  1,000 mg Intravenous Q18H Vira Blanco, RPH   1,000 mg at 12/05/14 0507    OBJECTIVE:  Filed Vitals:   12/05/14 0446  BP: 138/71  Pulse: 76  Temp: 98 F (36.7 C)  Resp: 16     Body mass index is 22.5 kg/(m^2).    ECOG FS:2 - Symptomatic, <50% confined to bed  PHYSICAL EXAM: GENERAL:  Patient is in mild distress. MENTAL STATUS:  Alert and oriented to person,  place and time. HEAD:    Normocephalic, atraumatic, face symmetric, no Cushingoid features. EYES:  eyes.pale  Pupils equal round and reactive to light and accomodation.  No conjunctivitis or scleral icterus. ENT:  Oropharynx clear without lesion.  Tongue normal. Mucous membranes moist.  RESPIRATORY: Diminished air entry on the right side rhonchi on both sides.  CARDIOVASCULAR:  Regular rate and rhythm without murmur, rub or gallop. BREAST:  Right breast without masses, skin changes or nipple discharge.  Left breast without masses, skin changes or nipple discharge. ABDOMEN:  Soft, non-tender, with active bowel sounds, and no hepatosplenomegaly.  No masses. BACK:  No CVA tenderness.  No tenderness on percussion of the back or rib cage. SKIN:  No rashes, ulcers or lesions. EXTREMITIES: No edema, no skin discoloration or tenderness.  No palpable cords. LYMPH NODES: No palpable cervical, supraclavicular, axillary or inguinal adenopathy  NEUROLOGICAL: Unremarkable. PSYCH:  Appropriate.   LAB RESULTS:  Admission on 11/29/2014  Component Date Value Ref Range Status  . Sodium 11/29/2014 133* 135 - 145 mmol/L Final  . Potassium 11/29/2014 3.5  3.5 - 5.1 mmol/L Final  . Chloride 11/29/2014 98* 101 - 111 mmol/L Final  . CO2 11/29/2014 26  22 - 32 mmol/L Final  . Glucose, Bld 11/29/2014 93  65 - 99 mg/dL Final  . BUN 11/29/2014 32* 6 - 20 mg/dL Final  . Creatinine, Ser 11/29/2014 1.19* 0.44 - 1.00 mg/dL Final  . Calcium 11/29/2014 9.0  8.9 - 10.3 mg/dL Final  . GFR calc non Af Amer 11/29/2014 45* >60 mL/min Final  . GFR calc Af Amer 11/29/2014 52* >60 mL/min Final   Comment: (NOTE) The eGFR has been calculated using the CKD EPI equation. This calculation has not been validated in all clinical situations. eGFR's persistently <60 mL/min signify possible Chronic Kidney Disease.   . Anion gap 11/29/2014 9  5 - 15 Final  . WBC 11/29/2014 5.5  3.6 - 11.0 K/uL Final  . RBC 11/29/2014 3.49* 3.80 -  5.20 MIL/uL Final  . Hemoglobin 11/29/2014 12.4  12.0 - 16.0 g/dL Final  . HCT 11/29/2014 35.5  35.0 - 47.0 % Final  . MCV 11/29/2014 101.9* 80.0 - 100.0 fL Final  . MCH 11/29/2014 35.6* 26.0 - 34.0 pg Final  . MCHC 11/29/2014 34.9  32.0 - 36.0 g/dL Final  . RDW 11/29/2014 13.7  11.5 - 14.5 % Final  . Platelets 11/29/2014 171  150 - 440 K/uL Final  . Neutrophils Relative % 11/29/2014 81   Final  . Neutro Abs 11/29/2014 4.5  1.4 - 6.5 K/uL Final  . Lymphocytes Relative 11/29/2014 10   Final  . Lymphs Abs 11/29/2014 0.5* 1.0 - 3.6 K/uL Final  . Monocytes Relative 11/29/2014 7   Final  . Monocytes Absolute 11/29/2014 0.4  0.2 - 0.9 K/uL Final  . Eosinophils Relative 11/29/2014 1   Final  . Eosinophils Absolute 11/29/2014 0.0  0 - 0.7 K/uL Final  . Basophils Relative 11/29/2014 1   Final  . Basophils Absolute 11/29/2014 0.0  0 - 0.1 K/uL Final  . Sodium 11/30/2014 135  135 - 145 mmol/L Final  . Potassium 11/30/2014 3.4* 3.5 - 5.1 mmol/L Final  . Chloride 11/30/2014 101  101 - 111 mmol/L Final  . CO2 11/30/2014 23  22 - 32 mmol/L Final  . Glucose, Bld 11/30/2014 158* 65 - 99 mg/dL Final  . BUN 11/30/2014 30* 6 - 20 mg/dL Final  . Creatinine, Ser 11/30/2014 1.15* 0.44 - 1.00 mg/dL Final  . Calcium 11/30/2014 8.6* 8.9 - 10.3 mg/dL Final  . GFR calc non Af Amer 11/30/2014 47* >60 mL/min Final  . GFR calc Af Amer 11/30/2014 54* >60 mL/min Final   Comment: (NOTE) The eGFR has been calculated using the CKD EPI equation. This calculation has not been validated in all clinical situations.  eGFR's persistently <60 mL/min signify possible Chronic Kidney Disease.   . Anion gap 11/30/2014 11  5 - 15 Final  . WBC 11/30/2014 3.5* 3.6 - 11.0 K/uL Final  . RBC 11/30/2014 3.39* 3.80 - 5.20 MIL/uL Final  . Hemoglobin 11/30/2014 11.7* 12.0 - 16.0 g/dL Final  . HCT 11/30/2014 34.2* 35.0 - 47.0 % Final  . MCV 11/30/2014 101.1* 80.0 - 100.0 fL Final  . MCH 11/30/2014 34.4* 26.0 - 34.0 pg Final  . MCHC  11/30/2014 34.0  32.0 - 36.0 g/dL Final  . RDW 11/30/2014 14.0  11.5 - 14.5 % Final  . Platelets 11/30/2014 155  150 - 440 K/uL Final  . Glucose-Capillary 11/29/2014 108* 70 - 99 mg/dL Final  . Glucose-Capillary 11/30/2014 136* 70 - 99 mg/dL Final  . Specimen Description 11/30/2014 BRONCHIAL ALVEOLAR LAVAGE   Final  . Special Requests 11/30/2014 Immunocompromised   Final  . Gram Stain 11/30/2014    Final                   Value:FEW WBC SEEN NO ORGANISMS SEEN   . Culture 11/30/2014 NORMAL RESPIRATORY FLORA REDUCED   Final  . Report Status 11/30/2014 12/02/2014 FINAL   Final  . Specimen Description 11/30/2014 BRONCHIAL ALVEOLAR LAVAGE   Final  . Special Requests 11/30/2014 Immunocompromised   Final  . Report Status 11/30/2014 11/30/2014 FINAL   Final  . CYTOLOGY - NON GYN 11/30/2014    Final                   Value:Cytology - Non PAP CASE: [id number] PATIENT: Anna Mcbride Non-Gyn Cytology Report     SPECIMEN SUBMITTED: A. Lung, left lower lobe bronchial brushing  CLINICAL HISTORY: Previous history of non-Hodgkin lymphoma, no known recurrent disease at present time; acute on chronic lung disease  PRE-OPERATIVE DIAGNOSIS: Pulmonary infection with failure to resolve  POST-OPERATIVE DIAGNOSIS:      DIAGNOSIS: A. LUNG, LEFT LOWER LOBE; BRONCHIAL BRUSHING: - ACUTE INFLAMMATION. - NEGATIVE FOR MALIGNANT CELLS.  Comment: The sample contains neutrophils, macrophages, and bronchial epithelial cells. No viral cytopathic effects are identified.   GROSS DESCRIPTION: A. Immediate Assessment: Not performed Procedure: bronchoscopy Cytotechnologist: Rivka Barbara and Jeanelle Malling Site: LLL-bronchial brushing  Material submitted: 2  Diff-Quik stained slides No Pap stained slides Saline needle rinses in Cytolyte for ThinPrep No sample for flow cytometry  Gross Descript                         ion: Specimen Labeled: patient's name and medical record number Volume:  approximately 30 mL Description: CytoLyt solution, clear to tan with a 0.1 by less than 0.1 by less than 0.1 cm aggregate of tissue fragments Cell block(s): 1   Final Diagnosis performed by Bryan Lemma, MD.  Electronically signed 12/02/2014 4:45:12PM    The electronic signature indicates that the named Attending Pathologist has evaluated the specimen  Technical component performed at [facility], [address] [place], [place] [address] Lab: [phone] Dir: Darrick Penna. Evette Doffing, MD  Professional component performed at [facility], [facility], [place], [place], [place] [address] Lab: [phone] Dir: Dellia Nims. Rubinas, MD    . [facility] test code 11/30/2014 [id number]   Final  . [facility] test name 11/30/2014 AFB CULTURE AND SMEAR   Final  . Misc [facility] result 11/30/2014 COMMENT   Final   Comment: (NOTE) Test Ordered: [id number] Acid Fast Smear+Culture AFB Specimen Processing  Note:                     BN     Concentration                                               Acid Fast Smear                Negative                  BN   Performed At: [facility] [place], [place] [id number] Lindon Romp MD EH:[id number]   . Specimen Description 11/30/2014 LUNG   Final  . Special Requests 11/30/2014 BAL   Final  . Culture 11/30/2014 NO GROWTH 3 DAYS   Final  . Report Status 11/30/2014 PENDING   Incomplete  . Glucose-Capillary 11/30/2014 129* 70 - 99 mg/dL Final  . Glucose-Capillary 12/01/2014 139* 65 - 99 mg/dL Final  . Glucose-Capillary 12/02/2014 102* 65 - 99 mg/dL Final  . WBC 12/03/2014 5.7  3.6 - 11.0 K/uL Final  . RBC 12/03/2014 2.82* 3.80 - 5.20 MIL/uL Final  . Hemoglobin 12/03/2014 9.8* 12.0 - 16.0 g/dL Final  . HCT 12/03/2014 29.0* 35.0 - 47.0 % Final  . MCV 12/03/2014 102.7* 80.0 - 100.0 fL Final  . MCH 12/03/2014 34.7* 26.0 - 34.0 pg Final  . MCHC 12/03/2014 33.8  32.0 - 36.0 g/dL Final  . RDW 12/03/2014 14.0  11.5 - 14.5 % Final   . Platelets 12/03/2014 146* 150 - 440 K/uL Final  . Sodium 12/03/2014 140  135 - 145 mmol/L Final  . Potassium 12/03/2014 2.7* 3.5 - 5.1 mmol/L Final   Comment: CRITICAL RESULT CALLED TO, READ BACK BY AND VERIFIED WITH AMANDA PEREZ AT 0736 12/03/14 BY LAB   . Chloride 12/03/2014 110  101 - 111 mmol/L Final  . CO2 12/03/2014 24  22 - 32 mmol/L Final  . Glucose, Bld 12/03/2014 120* 65 - 99 mg/dL Final  . BUN 12/03/2014 21* 6 - 20 mg/dL Final  . Creatinine, Ser 12/03/2014 1.06* 0.44 - 1.00 mg/dL Final  . Calcium 12/03/2014 8.2* 8.9 - 10.3 mg/dL Final  . GFR calc non Af Amer 12/03/2014 52* >60 mL/min Final  . GFR calc Af Amer 12/03/2014 60* >60 mL/min Final   Comment: (NOTE) The eGFR has been calculated using the CKD EPI equation. This calculation has not been validated in all clinical situations. eGFR's persistently <60 mL/min signify possible Chronic Kidney Disease.   . Anion gap 12/03/2014 6  5 - 15 Final  . Vancomycin Tr 12/03/2014 6* 10 - 20 ug/mL Final  . WBC 12/04/2014 7.2  3.6 - 11.0 K/uL Final  . RBC 12/04/2014 3.12* 3.80 - 5.20 MIL/uL Final  . Hemoglobin 12/04/2014 10.6* 12.0 - 16.0 g/dL Final  . HCT 12/04/2014 32.1* 35.0 - 47.0 % Final  . MCV 12/04/2014 102.7* 80.0 - 100.0 fL Final  . MCH 12/04/2014 34.1* 26.0 - 34.0 pg Final  . MCHC 12/04/2014 33.2  32.0 - 36.0 g/dL Final  . RDW 12/04/2014 14.2  11.5 - 14.5 % Final  . Platelets 12/04/2014 180  150 - 440 K/uL Final  . Neutrophils Relative % 12/04/2014 76   Final  . Neutro Abs 12/04/2014 5.4  1.4 - 6.5 K/uL Final  . Lymphocytes Relative 12/04/2014 15  Final  . Lymphs Abs 12/04/2014 1.1  1.0 - 3.6 K/uL Final  . Monocytes Relative 12/04/2014 9   Final  . Monocytes Absolute 12/04/2014 0.6  0.2 - 0.9 K/uL Final  . Eosinophils Relative 12/04/2014 0   Final  . Eosinophils Absolute 12/04/2014 0.0  0 - 0.7 K/uL Final  . Basophils Relative 12/04/2014 0   Final  . Basophils Absolute 12/04/2014 0.0  0 - 0.1 K/uL Final  . Sodium  12/04/2014 140  135 - 145 mmol/L Final  . Potassium 12/04/2014 3.2* 3.5 - 5.1 mmol/L Final  . Chloride 12/04/2014 108  101 - 111 mmol/L Final  . CO2 12/04/2014 26  22 - 32 mmol/L Final  . Glucose, Bld 12/04/2014 87  65 - 99 mg/dL Final  . BUN 12/04/2014 16  6 - 20 mg/dL Final  . Creatinine, Ser 12/04/2014 1.03* 0.44 - 1.00 mg/dL Final  . Calcium 12/04/2014 8.3* 8.9 - 10.3 mg/dL Final  . GFR calc non Af Amer 12/04/2014 53* >60 mL/min Final  . GFR calc Af Amer 12/04/2014 >60  >60 mL/min Final   Comment: (NOTE) The eGFR has been calculated using the CKD EPI equation. This calculation has not been validated in all clinical situations. eGFR's persistently <60 mL/min signify possible Chronic Kidney Disease.   . Anion gap 12/04/2014 6  5 - 15 Final  . Glucose-Capillary 12/04/2014 83  65 - 99 mg/dL Final  . Comment 1 12/04/2014 Notify RN   Final  . Sodium 12/05/2014 141  135 - 145 mmol/L Final  . Potassium 12/05/2014 3.6  3.5 - 5.1 mmol/L Final  . Chloride 12/05/2014 109  101 - 111 mmol/L Final  . CO2 12/05/2014 27  22 - 32 mmol/L Final  . Glucose, Bld 12/05/2014 95  65 - 99 mg/dL Final  . BUN 12/05/2014 17  6 - 20 mg/dL Final  . Creatinine, Ser 12/05/2014 1.07* 0.44 - 1.00 mg/dL Final  . Calcium 12/05/2014 8.5* 8.9 - 10.3 mg/dL Final  . GFR calc non Af Amer 12/05/2014 51* >60 mL/min Final  . GFR calc Af Amer 12/05/2014 59* >60 mL/min Final   Comment: (NOTE) The eGFR has been calculated using the CKD EPI equation. This calculation has not been validated in all clinical situations. eGFR's persistently <60 mL/min signify possible Chronic Kidney Disease.   . Anion gap 12/05/2014 5  5 - 15 Final  . Glucose-Capillary 12/05/2014 82  65 - 99 mg/dL Final    STUDIES: X-ray Chest Pa And Lateral  11/29/2014   CLINICAL DATA:  History of recurrent diffuse large cell lymphoma. Fever and cough with nausea and vomiting. Recent fall with left arm fracture. Hypoxia.  EXAM: CHEST  2 VIEW  COMPARISON:   09/28/2014 and 06/14/2014  FINDINGS: Left subclavian Port-A-Cath unchanged with tip in the SVC. Lungs are adequately inflated with continued paramediastinal scarring right worse than left as well as right apical scarring likely due in part to post radiation change. Minimal pleural fluid tracking to the right apex. Slight worsening opacification over the anterior right upper lobe on the lateral film which may be due to infection. Possible small amount right pleural fluid slightly worse. Cardiac silhouette is within normal. There is a known acute displaced left humeral neck fracture extending into the region of the greater tuberosity. Remainder the exam is unchanged.  IMPRESSION: Slight worsening airspace density over the anterior right upper lobe on the lateral film which may be due to infection. Chronic changes in the paramediastinal regions and  right apex. Small amount right pleural fluid likely slightly worse.  Known acute minimally displaced left humeral neck/ head fracture.   Electronically Signed   By: Marin Olp M.D.   On: 11/29/2014 17:16   Ct Angio Chest Pe W/cm &/or Wo Cm  11/29/2014   CLINICAL DATA:  Recurrent large cell lymphoma presenting with shortness of breath, cough, and fever  EXAM: CT ANGIOGRAPHY CHEST WITH CONTRAST  TECHNIQUE: Multidetector CT imaging of the chest was performed using the standard protocol during bolus administration of intravenous contrast. Multiplanar CT image reconstructions and MIPs were obtained to evaluate the vascular anatomy.  CONTRAST:  21m OMNIPAQUE IOHEXOL 350 MG/ML SOLN  COMPARISON:  Chest CT April 18, 2014 and chest radiograph Nov 29, 2014  FINDINGS: There is no demonstrable pulmonary embolus. There is no thoracic aortic aneurysm or dissection.  There is underlying centrilobular emphysematous change. There is loculated effusion on the right with consolidation arising in the right perihilar region with extension into the right upper lobe. Some of this change  is felt to be of post radiation therapy etiology. There is moderate volume loss throughout this area. There is patchy airspace consolidation at multiple sites in the right lower lobe, characterized by several ill-defined semi-solid opacities. There is also patchy atelectasis in the lingula, primarily inferiorly. There is focal volume loss in the left perihilar region which is stable and felt to be primarily of radiation therapy etiology. There is mild lower lobe bronchiectatic change bilaterally.  Thyroid appears unremarkable. There is no apparent adenopathy. The pericardium is not thickened.  In the visualized upper abdomen, there is atherosclerotic change in aorta. There is hepatic steatosis.  There are no blastic or lytic bone lesions. Port-A-Cath tip is in the superior vena cava.  Review of the MIP images confirms the above findings.  IMPRESSION: No demonstrable pulmonary embolus.  Post radiation therapy change in both perihilar regions. Areas of patchy ground-glass type opacity in the right lower lobe, likely representing pneumonia. Atypical presentation of neoplasm is possible; a followup study in 6 day weeks may be advisable to further assess this region.  There is underlying emphysema. There is mild lower lobe bronchiectatic change.  No adenopathy.  Moderate loculated effusion on the right.  Hepatic steatosis.  Port-A-Cath tip in superior vena cava.   Electronically Signed   By: WLowella GripIII M.D.   On: 11/29/2014 17:33    ASSESSMENT: Acute bronchitis and pneumonia.  Atypical organisms gradually getting better.  All the cultures were negative.  MEDICAL DECISION MAKING:  All cultures were negative.  Patient will be discharged on prednisone tapering dose  Patient expressed understanding and was in agreement with this plan. She also understands that She can call clinic at any time with any questions, concerns, or complaints.    No matching staging information was found for the  patient.  JForest Gleason MD   12/05/2014 8:47 AM

## 2014-12-05 NOTE — Care Management (Signed)
Discharge to home today per Dr. Jeb Levering. Home oxygen thru [facility]. Declines home health and physical therapy in the home at this time. Husband is very supportive. Shelbie Ammons RN MSN Care Management [phone]

## 2014-12-05 NOTE — Telephone Encounter (Signed)
Discharge instructions say she should be on Prednisone, but no med was sent to pharmacy. She was told she got 40 mg of prednisone in hospital this morning. What ot do?

## 2014-12-05 NOTE — Telephone Encounter (Signed)
Prednisone taper 50 mg to 0 by 10 mg per day. I discussed this with [name] who said he will get rx tomorrow

## 2014-12-05 NOTE — Plan of Care (Signed)
Problem: Discharge Progression Outcomes Goal: Other Discharge Outcomes/Goals Plan of care progress to goal for: Pain-no c/o pain this shift Hemodynamically-pt continues on IV fluids at 10ml/hr, potassium bolus given this shift, tolerated well            Complications-pt c/o insomnia, prn meds given with improvement Diet-tolerating diet    Activity-pt up to bathroom unassisted several times per pt

## 2014-12-05 NOTE — Progress Notes (Signed)
Pt for discharge home / alert. No resp distress. Pt has 02 already set up at home 2l [place].  amb around hall.  Port deaccessed per protocol. Instructions discussed with pt and  Husband. Diet activity and f/u discussed.

## 2014-12-07 ENCOUNTER — Telehealth: Payer: Self-pay | Admitting: *Deleted

## 2014-12-07 DIAGNOSIS — G4700 Insomnia, unspecified: Secondary | ICD-10-CM

## 2014-12-07 MED ORDER — TRAZODONE HCL 100 MG PO TABS: 100.0000 mg | ORAL_TABLET | Freq: Every day | ORAL | Status: DC

## 2014-12-07 NOTE — Telephone Encounter (Signed)
Trazodone and Celexa have a drug interaction and wants to know if you still want to order the Trazodone

## 2014-12-07 NOTE — Telephone Encounter (Signed)
Informed pt that insurance company does not cover temazepam. Dr. Oliva Bustard would like to start trazodone 100mg  at bedtime. Informed patient will call in new medication to pharmacy. Pt verbalized understanding.

## 2014-12-07 NOTE — Telephone Encounter (Signed)
Per Dr Oliva Bustard, pharmacy notified to fill Trazodone

## 2014-12-08 LAB — VIRUS CULTURE

## 2014-12-09 ENCOUNTER — Other Ambulatory Visit: Payer: Self-pay | Admitting: *Deleted

## 2014-12-09 DIAGNOSIS — C8590 Non-Hodgkin lymphoma, unspecified, unspecified site: Secondary | ICD-10-CM

## 2014-12-14 ENCOUNTER — Ambulatory Visit: Payer: Medicare Other

## 2014-12-15 ENCOUNTER — Other Ambulatory Visit: Payer: Medicare Other

## 2014-12-15 ENCOUNTER — Ambulatory Visit: Payer: Medicare Other | Admitting: Oncology

## 2014-12-16 ENCOUNTER — Other Ambulatory Visit: Payer: Self-pay | Admitting: *Deleted

## 2014-12-16 DIAGNOSIS — C8590 Non-Hodgkin lymphoma, unspecified, unspecified site: Secondary | ICD-10-CM

## 2014-12-20 ENCOUNTER — Ambulatory Visit
Admission: RE | Admit: 2014-12-20 | Discharge: 2014-12-20 | Disposition: A | Discharge status: Home / Self Care | Payer: Medicare Other | Source: Ambulatory Visit | Attending: Oncology | Admitting: Oncology

## 2014-12-20 DIAGNOSIS — C8590 Non-Hodgkin lymphoma, unspecified, unspecified site: Secondary | ICD-10-CM | POA: Insufficient documentation

## 2014-12-20 LAB — GLUCOSE, CAPILLARY: GLUCOSE-CAPILLARY: 73 mg/dL (ref 65–99)

## 2014-12-20 MED ORDER — FLUDEOXYGLUCOSE F - 18 (FDG) INJECTION
11.3600 | Freq: Once | INTRAVENOUS | Status: AC | PRN
  Administered 2014-12-20: 11.36 via INTRAVENOUS

## 2014-12-21 ENCOUNTER — Inpatient Hospital Stay: Payer: Medicare Other | Attending: Family Medicine

## 2014-12-21 ENCOUNTER — Inpatient Hospital Stay (HOSPITAL_BASED_OUTPATIENT_CLINIC_OR_DEPARTMENT_OTHER): Payer: Medicare Other | Admitting: Oncology

## 2014-12-21 VITALS — BP 115/75 | HR 98 | Temp 96.6°F | Wt 121.0 lb

## 2014-12-21 DIAGNOSIS — Z8781 Personal history of (healed) traumatic fracture: Secondary | ICD-10-CM | POA: Insufficient documentation

## 2014-12-21 DIAGNOSIS — J189 Pneumonia, unspecified organism: Secondary | ICD-10-CM

## 2014-12-21 DIAGNOSIS — Z9181 History of falling: Secondary | ICD-10-CM

## 2014-12-21 DIAGNOSIS — K760 Fatty (change of) liver, not elsewhere classified: Secondary | ICD-10-CM

## 2014-12-21 DIAGNOSIS — Z8572 Personal history of non-Hodgkin lymphomas: Secondary | ICD-10-CM | POA: Diagnosis not present

## 2014-12-21 DIAGNOSIS — C8590 Non-Hodgkin lymphoma, unspecified, unspecified site: Secondary | ICD-10-CM

## 2014-12-21 DIAGNOSIS — R918 Other nonspecific abnormal finding of lung field: Secondary | ICD-10-CM

## 2014-12-21 DIAGNOSIS — J320 Chronic maxillary sinusitis: Secondary | ICD-10-CM | POA: Diagnosis not present

## 2014-12-21 DIAGNOSIS — K5730 Diverticulosis of large intestine without perforation or abscess without bleeding: Secondary | ICD-10-CM | POA: Diagnosis not present

## 2014-12-21 DIAGNOSIS — M795 Residual foreign body in soft tissue: Secondary | ICD-10-CM | POA: Diagnosis not present

## 2014-12-21 DIAGNOSIS — J45909 Unspecified asthma, uncomplicated: Secondary | ICD-10-CM | POA: Diagnosis not present

## 2014-12-21 DIAGNOSIS — Z9481 Bone marrow transplant status: Secondary | ICD-10-CM

## 2014-12-21 DIAGNOSIS — Z923 Personal history of irradiation: Secondary | ICD-10-CM | POA: Insufficient documentation

## 2014-12-21 DIAGNOSIS — D630 Anemia in neoplastic disease: Secondary | ICD-10-CM | POA: Diagnosis not present

## 2014-12-21 DIAGNOSIS — Z9221 Personal history of antineoplastic chemotherapy: Secondary | ICD-10-CM | POA: Insufficient documentation

## 2014-12-21 DIAGNOSIS — Z7982 Long term (current) use of aspirin: Secondary | ICD-10-CM | POA: Insufficient documentation

## 2014-12-21 DIAGNOSIS — Z8579 Personal history of other malignant neoplasms of lymphoid, hematopoietic and related tissues: Secondary | ICD-10-CM

## 2014-12-21 DIAGNOSIS — Z79899 Other long term (current) drug therapy: Secondary | ICD-10-CM | POA: Insufficient documentation

## 2014-12-21 DIAGNOSIS — J90 Pleural effusion, not elsewhere classified: Secondary | ICD-10-CM | POA: Diagnosis not present

## 2014-12-21 DIAGNOSIS — Z792 Long term (current) use of antibiotics: Secondary | ICD-10-CM

## 2014-12-21 LAB — COMPREHENSIVE METABOLIC PANEL
ALBUMIN: 3.6 g/dL (ref 3.5–5.0)
ALK PHOS: 77 U/L (ref 38–126)
ALT: 17 U/L (ref 14–54)
AST: 15 U/L (ref 15–41)
Anion gap: 5 (ref 5–15)
BUN: 28 mg/dL — AB (ref 6–20)
CO2: 25 mmol/L (ref 22–32)
CREATININE: 1.05 mg/dL — AB (ref 0.44–1.00)
Calcium: 8.7 mg/dL — ABNORMAL LOW (ref 8.9–10.3)
Chloride: 104 mmol/L (ref 101–111)
GFR calc Af Amer: >60 mL/min (ref >60)
GFR, EST NON AFRICAN AMERICAN: 52 mL/min — AB (ref >60)
GLUCOSE: 135 mg/dL — AB (ref 65–99)
POTASSIUM: 3.2 mmol/L — AB (ref 3.5–5.1)
Sodium: 134 mmol/L — ABNORMAL LOW (ref 135–145)
TOTAL PROTEIN: 6.5 g/dL (ref 6.5–8.1)
Total Bilirubin: 0.5 mg/dL (ref 0.3–1.2)

## 2014-12-21 LAB — CBC WITH DIFFERENTIAL/PLATELET
BASOS ABS: 0 10*3/uL (ref 0–0.1)
Basophils Relative: 1 %
Eosinophils Absolute: 0.1 10*3/uL (ref 0–0.7)
Eosinophils Relative: 4 %
HEMATOCRIT: 34.9 % — AB (ref 35.0–47.0)
HEMOGLOBIN: 11.4 g/dL — AB (ref 12.0–16.0)
LYMPHS PCT: 19 %
Lymphs Abs: 0.6 10*3/uL — ABNORMAL LOW (ref 1.0–3.6)
MCH: 33.5 pg (ref 26.0–34.0)
MCHC: 32.8 g/dL (ref 32.0–36.0)
MCV: 102.3 fL — ABNORMAL HIGH (ref 80.0–100.0)
MONOS PCT: 15 %
Monocytes Absolute: 0.5 10*3/uL (ref 0.2–0.9)
NEUTROS PCT: 61 %
Neutro Abs: 2.1 10*3/uL (ref 1.4–6.5)
PLATELETS: 140 10*3/uL — AB (ref 150–440)
RBC: 3.41 MIL/uL — AB (ref 3.80–5.20)
RDW: 15.6 % — AB (ref 11.5–14.5)
WBC: 3.4 10*3/uL — ABNORMAL LOW (ref 3.6–11.0)

## 2014-12-21 LAB — SAMPLE TO BLOOD BANK

## 2014-12-21 LAB — LACTATE DEHYDROGENASE: LDH: 148 U/L (ref 98–192)

## 2014-12-21 MED ORDER — LEVOFLOXACIN 500 MG PO TABS: 500.0000 mg | ORAL_TABLET | Freq: Every day | ORAL | Status: DC

## 2014-12-21 MED ORDER — DOXYCYCLINE HYCLATE 100 MG PO TABS: 100.0000 mg | ORAL_TABLET | Freq: Two times a day (BID) | ORAL | Status: DC

## 2014-12-21 NOTE — Progress Notes (Signed)
Pt has never smoked.  Does have living will.

## 2014-12-23 ENCOUNTER — Encounter: Payer: Self-pay | Admitting: Oncology

## 2014-12-23 LAB — CULTURE, FUNGUS WITHOUT SMEAR

## 2014-12-23 NOTE — Progress Notes (Signed)
[place] @ [facility] [phone]  Fax:([phone]) [place]: September 02, 1945  MR#: [id number]  RCV#:[id number]  Patient Care Team: Madelyn Brunner, MD as PCP - General (Unknown Physician Specialty) Madelyn Brunner, MD as Referring Physician (Unknown Physician Specialty) Forest Gleason, MD (Unknown Physician Specialty) Christene Lye, MD (General Surgery)  CHIEF COMPLAINT:  Chief Complaint  Patient presents with  . Follow-up    Oncology History   1.  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  Status post 6 cycles of chemotherapy with CHOP9nd involved field radiation treatment 18,00 rads to the mantle field  and mediasinal boost 2340. finished radiation and chemotherapy in November of 2000 residual mediastinotomy mass 2 cm 2.  In May of 2001 progressive disease with mediastinal mass becoming bigger 8 cm3.  The patient underwent 2 cycles of chemotherapy withRituxan, and ICE.(June of 2001) Followed by 2more cycles  at [facility] with DHAP.and high-dose chemotherapy with  BEAM finished treatment in October of 2001. patient had significant pancytopenia as well as fever lasting for 3 months after transplant. Recurrent sinus infection and pulmonary infection 3.  Patient  underwent EUS biopsy of which was negative .  (November, 2012) 4.  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  Started on RICE october 2014 6.ppatient is now being considered for high-dose chemotherapy and stem cell support (March, 2015) 7.2nd stem cell transplant cannot be done because of the  no stem cell to harvest     Lymphoma, non-Hodgkin's   06/30/2013 Initial Diagnosis Lymphoma, non-Hodgkin's    Oncology Flowsheet 12/02/2014 12/03/2014 12/03/2014 12/03/2014 12/03/2014 12/04/2014 12/05/2014  enoxaparin (LOVENOX) [place]   40 mg       40 mg -  methylPREDNISolone sodium  succinate 125 mg/2 mL (SOLU-MEDROL) IV - - - - - - -  ondansetron (ZOFRAN) IV 4 mg 4 mg 4 mg 4 mg 4 mg - -  predniSONE (DELTASONE) PO   40 mg       40 mg 40 mg    INTERVAL HISTORY:   69 year old lady with recurrent diffuse large cell lymphoma.  Came today for the follow-up.  No chills.  No fever.  Patient had a PET scan done patient also had a recurrent pulmonary infection with multiple admission in the hospital.  During last admission patient underwent bronchoscopy without any specific organisms identified. Gradually now recovering. REVIEW OF SYSTEMS:   No status: Patient is feeling weak and tired.  No chills.  No fever. Lungs: On oxygen.  Shortness of breath on exertion.  No cough or expectoration  Cardiac: No chest pain. Gastro intestinal system: No heartburn.  No nausea or vomiting.  No abdominal pain.  No diarrhea.  No constipation.  No rectal bleeding. Neurological system: No dizziness.  No tingling.  His was.  no tingling numbness.  no focal weakness or any focal signs. Skin: No evidence of ecchymosis or rash. Lower extremity no edema Skin no skeletal system no bony pain As per HPI. Otherwise, a complete review of systems is negatve.  PAST MEDICAL HISTORY: Past Medical History  Diagnosis Date  . Non Hodgkin's lymphoma   . Asthma   . Acute respiratory failure 11/29/2014  . Multiple myeloma 12/01/2014  . 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 Nasal sinus surgery  08/07/05 09/09/05    x 2     FAMILY HISTORY Family History  Problem Relation Age of Onset  . Asthma Son     ADVANCED DIRECTIVES:  Patient does have advanced health care directive HEALTH MAINTENANCE: History  Substance Use Topics  . Smoking status: Never Smoker   . Smokeless tobacco: Never Used  . Alcohol Use: Yes      Allergies  Allergen Reactions  . Hydrocodone-Homatropine Itching  .  Tussionex Pennkinetic Er [Hydrocod Polst-Cpm Polst Er] Itching  . Ciprofloxacin Rash    Current Outpatient Prescriptions  Medication Sig Dispense Refill  . albuterol (PROVENTIL HFA;VENTOLIN HFA) 108 (90 BASE) MCG/ACT inhaler Inhale 1 puff into the lungs every 6 (six) hours as needed for wheezing or shortness of breath.    . citalopram (CELEXA) 20 MG tablet Take 20 mg by mouth daily.      . Fluticasone-Salmeterol (ADVAIR DISKUS) 250-50 MCG/DOSE AEPB Inhale 1 puff into the lungs 2 (two) times daily.     [name] guaiFENesin (MUCINEX) 600 MG 12 hr tablet Take 600 mg by mouth 2 (two) times daily as needed for to loosen phlegm.    [name] levothyroxine (SYNTHROID, LEVOTHROID) 25 MCG tablet Take 25 mcg by mouth daily.      . pantoprazole (PROTONIX) 40 MG tablet Take 1 tablet (40 mg total) by mouth at bedtime. 1 tablet 6  . potassium chloride SA (K-DUR,KLOR-CON) 20 MEQ tablet Take 20 mEq by mouth 2 (two) times daily.    [name] aspirin EC 81 MG tablet Take 81 mg by mouth daily.    . cetirizine (ZYRTEC) 10 MG tablet Take 10 mg by mouth daily as needed for allergies.     [name] doxycycline (VIBRA-TABS) 100 MG tablet Take 1 tablet (100 mg total) by mouth 2 (two) times daily. For 15 days. 30 tablet 3  . levofloxacin (LEVAQUIN) 500 MG tablet Take 1 tablet (500 mg total) by mouth daily. Start taking after doxycycline for 7 days then take 1 week off. 7 tablet 3  . predniSONE (DELTASONE) 10 MG tablet 5 tabs day 1, 4 tabs day 2, 3 tabs day 3, 2 tabs day 4 1 tab day 5 then discontinue (Patient not taking: Reported on 12/21/2014) 15 tablet 0  . senna-docusate (SENOKOT-S) 8.6-50 MG per tablet Take 1 tablet by mouth at bedtime as needed for mild constipation. (Patient not taking: Reported on 12/21/2014) 60 tablet 6  . traZODone (DESYREL) 100 MG tablet Take 1 tablet (100 mg total) by mouth at bedtime. (Patient not taking: Reported on 12/21/2014) 30 tablet 3   No current facility-administered medications for this visit.    OBJECTIVE:  Filed  Vitals:   12/21/14 1607  BP: 115/75  Pulse: 98  Temp: 96.6 F (35.9 C)     Body mass index is 21.45 kg/(m^2).    ECOG FS:2 - Symptomatic, <50% confined to bed  PHYSICAL EXAM: Gen. Status: Patient is alert and oriented on oxygen. In  Wheelchair Head exam was generally normal. There was no scleral icterus or corneal arcus. Mucous membranes were moist. Abdomen: Soft liver and spleen not palpable no ascites Lungs: Emphysematous chest.  L rhonchi.  Diminished and entry. Examination of the skin revealed no evidence of significant rashes, suspicious appearing nevi or other concerning lesions. Neurologically, the patient was awake, alert, and oriented to person, place and time. There were no obvious focal neurologic abnormalities. Cardiac exam revealed the PMI to be  normally situated and sized. The rhythm was regular and no extrasystoles were noted during several minutes of auscultation. The first and second heart sounds were normal and physiologic splitting of the second heart sound was noted. There were no murmurs, rubs, clicks, or gallops.    LAB RESULTS:  Appointment on 12/21/2014  Component Date Value Ref Range Status  . WBC 12/21/2014 3.4* 3.6 - 11.0 K/uL Final  . RBC 12/21/2014 3.41* 3.80 - 5.20 MIL/uL Final  . Hemoglobin 12/21/2014 11.4* 12.0 - 16.0 g/dL Final  . HCT 12/21/2014 34.9* 35.0 - 47.0 % Final  . MCV 12/21/2014 102.3* 80.0 - 100.0 fL Final  . MCH 12/21/2014 33.5  26.0 - 34.0 pg Final  . MCHC 12/21/2014 32.8  32.0 - 36.0 g/dL Final  . RDW 12/21/2014 15.6* 11.5 - 14.5 % Final  . Platelets 12/21/2014 140* 150 - 440 K/uL Final  . Neutrophils Relative % 12/21/2014 61   Final  . Neutro Abs 12/21/2014 2.1  1.4 - 6.5 K/uL Final  . Lymphocytes Relative 12/21/2014 19   Final  . Lymphs Abs 12/21/2014 0.6* 1.0 - 3.6 K/uL Final  . Monocytes Relative 12/21/2014 15   Final  . Monocytes Absolute 12/21/2014 0.5  0.2 - 0.9 K/uL Final  . Eosinophils Relative 12/21/2014 4   Final  .  Eosinophils Absolute 12/21/2014 0.1  0 - 0.7 K/uL Final  . Basophils Relative 12/21/2014 1   Final  . Basophils Absolute 12/21/2014 0.0  0 - 0.1 K/uL Final  . Sodium 12/21/2014 134* 135 - 145 mmol/L Final  . Potassium 12/21/2014 3.2* 3.5 - 5.1 mmol/L Final  . Chloride 12/21/2014 104  101 - 111 mmol/L Final  . CO2 12/21/2014 25  22 - 32 mmol/L Final  . Glucose, Bld 12/21/2014 135* 65 - 99 mg/dL Final  . BUN 12/21/2014 28* 6 - 20 mg/dL Final  . Creatinine, Ser 12/21/2014 1.05* 0.44 - 1.00 mg/dL Final  . Calcium 12/21/2014 8.7* 8.9 - 10.3 mg/dL Final  . Total Protein 12/21/2014 6.5  6.5 - 8.1 g/dL Final  . Albumin 12/21/2014 3.6  3.5 - 5.0 g/dL Final  . AST 12/21/2014 15  15 - 41 U/L Final  . ALT 12/21/2014 17  14 - 54 U/L Final  . Alkaline Phosphatase 12/21/2014 77  38 - 126 U/L Final  . Total Bilirubin 12/21/2014 0.5  0.3 - 1.2 mg/dL Final  . GFR calc non Af Amer 12/21/2014 52* >60 mL/min Final  . GFR calc Af Amer 12/21/2014 >60  >60 mL/min Final   Comment: (NOTE) The eGFR has been calculated using the CKD EPI equation. This calculation has not been validated in all clinical situations. eGFR's persistently <60 mL/min signify possible Chronic Kidney Disease.   . Anion gap 12/21/2014 5  5 - 15 Final  . Blood Bank Specimen 12/21/2014 SAMPLE AVAILABLE FOR TESTING   Final  . Sample Expiration 12/21/2014 12/24/2014   Final  . LDH 12/21/2014 148  98 - 192 U/L Final  Hospital Outpatient Visit on 12/20/2014  Component Date Value Ref Range Status  . Glucose-Capillary 12/20/2014 73  65 - 99 mg/dL Final      STUDIES: X-ray Chest Pa And Lateral  11/29/2014   CLINICAL DATA:  History of recurrent diffuse large cell lymphoma. Fever and cough with nausea and vomiting. Recent fall with left arm fracture. Hypoxia.  EXAM: CHEST  2 VIEW  COMPARISON:  09/28/2014 and 06/14/2014  FINDINGS: Left subclavian Port-A-Cath unchanged with tip in the SVC. Lungs  are adequately inflated with continued  paramediastinal scarring right worse than left as well as right apical scarring likely due in part to post radiation change. Minimal pleural fluid tracking to the right apex. Slight worsening opacification over the anterior right upper lobe on the lateral film which may be due to infection. Possible small amount right pleural fluid slightly worse. Cardiac silhouette is within normal. There is a known acute displaced left humeral neck fracture extending into the region of the greater tuberosity. Remainder the exam is unchanged.  IMPRESSION: Slight worsening airspace density over the anterior right upper lobe on the lateral film which may be due to infection. Chronic changes in the paramediastinal regions and right apex. Small amount right pleural fluid likely slightly worse.  Known acute minimally displaced left humeral neck/ head fracture.   Electronically Signed   By: Elberta Fortis M.D.   On: 11/29/2014 17:16   Ct Angio Chest Pe W/cm &/or Wo Cm  11/29/2014   CLINICAL DATA:  Recurrent large cell lymphoma presenting with shortness of breath, cough, and fever  EXAM: CT ANGIOGRAPHY CHEST WITH CONTRAST  TECHNIQUE: Multidetector CT imaging of the chest was performed using the standard protocol during bolus administration of intravenous contrast. Multiplanar CT image reconstructions and MIPs were obtained to evaluate the vascular anatomy.  CONTRAST:  15mL OMNIPAQUE IOHEXOL 350 MG/ML SOLN  COMPARISON:  Chest CT April 18, 2014 and chest radiograph Nov 29, 2014  FINDINGS: There is no demonstrable pulmonary embolus. There is no thoracic aortic aneurysm or dissection.  There is underlying centrilobular emphysematous change. There is loculated effusion on the right with consolidation arising in the right perihilar region with extension into the right upper lobe. Some of this change is felt to be of post radiation therapy etiology. There is moderate volume loss throughout this area. There is patchy airspace consolidation  at multiple sites in the right lower lobe, characterized by several ill-defined semi-solid opacities. There is also patchy atelectasis in the lingula, primarily inferiorly. There is focal volume loss in the left perihilar region which is stable and felt to be primarily of radiation therapy etiology. There is mild lower lobe bronchiectatic change bilaterally.  Thyroid appears unremarkable. There is no apparent adenopathy. The pericardium is not thickened.  In the visualized upper abdomen, there is atherosclerotic change in aorta. There is hepatic steatosis.  There are no blastic or lytic bone lesions. Port-A-Cath tip is in the superior vena cava.  Review of the MIP images confirms the above findings.  IMPRESSION: No demonstrable pulmonary embolus.  Post radiation therapy change in both perihilar regions. Areas of patchy ground-glass type opacity in the right lower lobe, likely representing pneumonia. Atypical presentation of neoplasm is possible; a followup study in 6 day weeks may be advisable to further assess this region.  There is underlying emphysema. There is mild lower lobe bronchiectatic change.  No adenopathy.  Moderate loculated effusion on the right.  Hepatic steatosis.  Port-A-Cath tip in superior vena cava.   Electronically Signed   By: Bretta Bang III M.D.   On: 11/29/2014 17:33   Nm Pet Image Restag (ps) Skull Base To Thigh  12/20/2014   CLINICAL DATA:  Subsequent treatment strategy for lymphoma.  EXAM: NUCLEAR MEDICINE PET SKULL BASE TO THIGH  TECHNIQUE: 11.4 mCi F-18 FDG was injected intravenously. Full-ring PET imaging was performed from the skull base to thigh after the radiotracer. CT data was obtained and used for attenuation correction and anatomic localization.  FASTING BLOOD GLUCOSE:  Value: 73 mg/dl  COMPARISON:  01/18/2014  FINDINGS: NECK  Glottic activity is likely physiologic. Mild chronic bilateral maxillary sinusitis. Metal foreign body in the right upper preseptal periorbital  space, image 5 series 3.  CHEST  Stable right paramediastinal fibrosis. Reduced size of the right loculated pleural effusion.  Ground-glass opacity in the left lower lobe associated with hypermetabolic activity, measuring up to 4.8. There is previously no hypermetabolic activity in this vicinity.  The prior mucoid impaction in the lingula has improved. There is scattered scarring in both lungs.  ABDOMEN/PELVIS  No abnormal hypermetabolic activity within the liver, pancreas, adrenal glands, or spleen. No hypermetabolic lymph nodes in the abdomen or pelvis.  Aortoiliac atherosclerotic vascular disease. Sigmoid colon diverticulosis.  SKELETON  Left proximal humeral fracture with hypermetabolic activity in this vicinity measuring up to 4.0. This activity is mostly along the fracture margins. Incidental note is made of extravasation of radiopharmaceutical in the left antecubital fossa.  IMPRESSION: 1. Hypermetabolic activity associated with ground-glass opacity in the left lower lobe, probably inflammatory rather than representing lymphomatous infiltration of the lung. This may merit observation to ensure resolution. From a technical standpoint this qualifies as [place] 4. 2. Essentially stable right paramediastinal fibrosis. 3. Increased metabolic activity associated with the surgical neck fracture of the left proximal humerus which appears to be from benign healing response. 4. Chronic bilateral maxillary sinusitis. 5. Metal foreign body in the right upper lateral periorbital soft tissues, thought to be immediately preseptal. Depending on the nature this foreign body, it may preclude MRI.   Electronically Signed   By: Van Clines M.D.   On: 12/20/2014 16:22    ASSESSMENT: Diffuse loss B-cell lymphoma recurrent disease status post chemotherapy Recurrent pulmonary infection  MEDICAL DECISION MAKING:  PET scan has been reviewed there is no evidence of recurrent or progressive disease. In order to avoid  any more pulmonary infection we will start patient on preventative doxycycline 100 mg twice a day for 15 days followed by Levaquin 500 mg daily for 10 days.  With 1 week break and cycle will be repeated  Patient expressed understanding and was in agreement with this plan. She also understands that She can call clinic at any time with any questions, concerns, or complaints.    No matching staging information was found for the patient.  Forest Gleason, MD   12/23/2014 5:36 PM

## 2015-01-18 ENCOUNTER — Inpatient Hospital Stay: Payer: Medicare Other

## 2015-01-18 ENCOUNTER — Inpatient Hospital Stay (HOSPITAL_BASED_OUTPATIENT_CLINIC_OR_DEPARTMENT_OTHER): Payer: Medicare Other | Admitting: Oncology

## 2015-01-18 ENCOUNTER — Encounter: Payer: Self-pay | Admitting: Oncology

## 2015-01-18 VITALS — BP 122/82 | HR 92 | Temp 97.5°F | Wt 121.3 lb

## 2015-01-18 DIAGNOSIS — Z9481 Bone marrow transplant status: Secondary | ICD-10-CM

## 2015-01-18 DIAGNOSIS — K760 Fatty (change of) liver, not elsewhere classified: Secondary | ICD-10-CM

## 2015-01-18 DIAGNOSIS — Z8572 Personal history of non-Hodgkin lymphomas: Secondary | ICD-10-CM

## 2015-01-18 DIAGNOSIS — J45909 Unspecified asthma, uncomplicated: Secondary | ICD-10-CM

## 2015-01-18 DIAGNOSIS — Z9221 Personal history of antineoplastic chemotherapy: Secondary | ICD-10-CM

## 2015-01-18 DIAGNOSIS — J90 Pleural effusion, not elsewhere classified: Secondary | ICD-10-CM

## 2015-01-18 DIAGNOSIS — C8590 Non-Hodgkin lymphoma, unspecified, unspecified site: Secondary | ICD-10-CM

## 2015-01-18 DIAGNOSIS — Z79899 Other long term (current) drug therapy: Secondary | ICD-10-CM

## 2015-01-18 DIAGNOSIS — J189 Pneumonia, unspecified organism: Secondary | ICD-10-CM | POA: Diagnosis not present

## 2015-01-18 DIAGNOSIS — K5730 Diverticulosis of large intestine without perforation or abscess without bleeding: Secondary | ICD-10-CM

## 2015-01-18 DIAGNOSIS — Z9181 History of falling: Secondary | ICD-10-CM

## 2015-01-18 DIAGNOSIS — J320 Chronic maxillary sinusitis: Secondary | ICD-10-CM

## 2015-01-18 DIAGNOSIS — M795 Residual foreign body in soft tissue: Secondary | ICD-10-CM

## 2015-01-18 DIAGNOSIS — Z923 Personal history of irradiation: Secondary | ICD-10-CM

## 2015-01-18 DIAGNOSIS — D630 Anemia in neoplastic disease: Secondary | ICD-10-CM

## 2015-01-18 DIAGNOSIS — Z8781 Personal history of (healed) traumatic fracture: Secondary | ICD-10-CM

## 2015-01-18 DIAGNOSIS — R918 Other nonspecific abnormal finding of lung field: Secondary | ICD-10-CM

## 2015-01-18 DIAGNOSIS — Z7982 Long term (current) use of aspirin: Secondary | ICD-10-CM

## 2015-01-18 LAB — CBC WITH DIFFERENTIAL/PLATELET
Basophils Absolute: 0.1 10*3/uL (ref 0–0.1)
Basophils Relative: 1 %
EOS ABS: 0.2 10*3/uL (ref 0–0.7)
EOS PCT: 4 %
HCT: 38.2 % (ref 35.0–47.0)
Hemoglobin: 12.6 g/dL (ref 12.0–16.0)
LYMPHS ABS: 1.1 10*3/uL (ref 1.0–3.6)
Lymphocytes Relative: 25 %
MCH: 33.9 pg (ref 26.0–34.0)
MCHC: 32.9 g/dL (ref 32.0–36.0)
MCV: 103 fL — AB (ref 80.0–100.0)
Monocytes Absolute: 0.7 10*3/uL (ref 0.2–0.9)
Monocytes Relative: 17 %
Neutro Abs: 2.3 10*3/uL (ref 1.4–6.5)
Neutrophils Relative %: 53 %
PLATELETS: 169 10*3/uL (ref 150–440)
RBC: 3.71 MIL/uL — AB (ref 3.80–5.20)
RDW: 15.3 % — AB (ref 11.5–14.5)
WBC: 4.2 10*3/uL (ref 3.6–11.0)

## 2015-01-18 LAB — COMPREHENSIVE METABOLIC PANEL
ALT: 14 U/L (ref 14–54)
AST: 15 U/L (ref 15–41)
Albumin: 4.1 g/dL (ref 3.5–5.0)
Alkaline Phosphatase: 76 U/L (ref 38–126)
Anion gap: 5 (ref 5–15)
BUN: 27 mg/dL — ABNORMAL HIGH (ref 6–20)
CO2: 27 mmol/L (ref 22–32)
CREATININE: 1.33 mg/dL — AB (ref 0.44–1.00)
Calcium: 9 mg/dL (ref 8.9–10.3)
Chloride: 104 mmol/L (ref 101–111)
GFR, EST AFRICAN AMERICAN: 45 mL/min — AB (ref >60)
GFR, EST NON AFRICAN AMERICAN: 39 mL/min — AB (ref >60)
Glucose, Bld: 97 mg/dL (ref 65–99)
Potassium: 4 mmol/L (ref 3.5–5.1)
Sodium: 136 mmol/L (ref 135–145)
Total Bilirubin: 0.7 mg/dL (ref 0.3–1.2)
Total Protein: 6.9 g/dL (ref 6.5–8.1)

## 2015-01-18 MED ORDER — SODIUM CHLORIDE 0.9 % IJ SOLN
10.0000 mL | INTRAMUSCULAR | Status: DC | PRN
  Administered 2015-01-18: 10 mL via INTRAVENOUS
  Filled 2015-01-18: qty 10

## 2015-01-18 MED ORDER — HEPARIN SOD (PORK) LOCK FLUSH 100 UNIT/ML IV SOLN
500.0000 [IU] | Freq: Once | INTRAVENOUS | Status: AC
  Administered 2015-01-18: 500 [IU] via INTRAVENOUS

## 2015-01-18 NOTE — Progress Notes (Signed)
Patient had living will.  Never smoked.

## 2015-01-18 NOTE — Progress Notes (Signed)
[place] @ [facility] [phone]) [phone]  Fax:(336) [place]: [birth year]  MR#: [id number]  ZDG#:[id number]  Patient Care Team: Madelyn Brunner, MD as PCP - General (Unknown Physician Specialty) Madelyn Brunner, MD as Referring Physician (Unknown Physician Specialty) Forest Gleason, MD (Unknown Physician Specialty) Christene Lye, MD (General Surgery)  CHIEF COMPLAINT:  Chief Complaint  Patient presents with  . Follow-up    Oncology History   1.  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  Status post 6 cycles of chemotherapy with CHOP9nd involved field radiation treatment 18,00 rads to the mantle field  and mediasinal boost 2340. finished radiation and chemotherapy in November of 2000 residual mediastinotomy mass 2 cm 2.  In May of 2001 progressive disease with mediastinal mass becoming bigger 8 cm3.  The patient underwent 2 cycles of chemotherapy withRituxan, and ICE.(June of 2001) Followed by 52more cycles  at [facility] with DHAP.and high-dose chemotherapy with  BEAM finished treatment in October of 2001. patient had significant pancytopenia as well as fever lasting for 3 months after transplant. Recurrent sinus infection and pulmonary infection 3.  Patient  underwent EUS biopsy of which was negative .  (November, 2012) 4.  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  Started on RICE october 2014 6.ppatient is now being considered for high-dose chemotherapy and stem cell support (March, 2015) 7.2nd stem cell transplant cannot be done because of the  no stem cell to harvest     Lymphoma, non-Hodgkin's   06/30/2013 Initial Diagnosis Lymphoma, non-Hodgkin's    Oncology Flowsheet 12/02/2014 12/03/2014 12/03/2014 12/03/2014 12/03/2014 12/04/2014 12/05/2014  enoxaparin (LOVENOX) [place]   40 mg       40 mg -  methylPREDNISolone sodium  succinate 125 mg/2 mL (SOLU-MEDROL) IV - - - - - - -  ondansetron (ZOFRAN) IV 4 mg 4 mg 4 mg 4 mg 4 mg - -  predniSONE (DELTASONE) PO   40 mg       40 mg 40 mg    INTERVAL HISTORY:   69 year old lady with recurrent diffuse large cell lymphoma.  Came today for the follow-up.  No chills.  No fever.  Patient had a PET scan done patient also had a recurrent pulmonary infection with multiple admission in the hospital.  During last admission patient underwent bronchoscopy without any specific organisms identified. Gradually now recovering.  January 18, 2015. He should not is here for further follow-up regarding recurrent pulmonary infection.  Lymphoma involving lungs recurrent disease patient is off all chemotherapy. Continues to have dry hacking cough or shortness of breath on oxygen.  Feeling weak and tired.  Patient has been put on alternating chemotherapy antibiotic therapy to avoid pulmonary infection.  Patient is tolerating that very well did not have any significant problem in last 4 weeks   REVIEW OF SYSTEMS:   No status: Patient is feeling weak and tired.  No chills.  No fever. Lungs: On oxygen.  Shortness of breath on exertion.  No cough or expectoration  Cardiac: No chest pain. Gastro intestinal system: No heartburn.  No nausea or vomiting.  No abdominal pain.  No diarrhea.  No constipation.  No rectal bleeding. Neurological system: No dizziness.  No tingling.  His was.  no tingling numbness.  no focal weakness or any focal signs. Skin: No evidence  of ecchymosis or rash. Lower extremity no edema Skin no skeletal system no bony pain. Is and did not have any pulmonary infection in last 4 weeks.  Had dizziness with Levaquin but started taking in the night and did not have any problem. As per HPI. Otherwise, a complete review of systems is negatve.  PAST MEDICAL HISTORY: Past Medical History  Diagnosis Date  . Non Hodgkin's lymphoma   . Asthma   . Acute respiratory failure 11/29/2014    . Multiple myeloma 12/01/2014  . 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 Nasal sinus surgery  08/07/05 09/09/05    x 2     FAMILY HISTORY Family History  Problem Relation Age of Onset  . Asthma Son     ADVANCED DIRECTIVES:  Patient does have advanced health care directive HEALTH MAINTENANCE: History  Substance Use Topics  . Smoking status: Never Smoker   . Smokeless tobacco: Never Used  . Alcohol Use: Yes      Allergies  Allergen Reactions  . Hydrocodone-Homatropine Itching  . Tussionex Pennkinetic Er [Hydrocod Polst-Cpm Polst Er] Itching  . Ciprofloxacin Rash    Current Outpatient Prescriptions  Medication Sig Dispense Refill  . albuterol (PROVENTIL HFA;VENTOLIN HFA) 108 (90 BASE) MCG/ACT inhaler Inhale 1 puff into the lungs every 6 (six) hours as needed for wheezing or shortness of breath.    [name] aspirin EC 81 MG tablet Take 81 mg by mouth daily.    . citalopram (CELEXA) 20 MG tablet Take 20 mg by mouth daily.      . Fluticasone-Salmeterol (ADVAIR DISKUS) 250-50 MCG/DOSE AEPB Inhale 1 puff into the lungs 2 (two) times daily.     [name] levothyroxine (SYNTHROID, LEVOTHROID) 25 MCG tablet Take 25 mcg by mouth daily.      . pantoprazole (PROTONIX) 40 MG tablet Take 1 tablet (40 mg total) by mouth at bedtime. 1 tablet 6  . potassium chloride SA (K-DUR,KLOR-CON) 20 MEQ tablet Take 20 mEq by mouth 2 (two) times daily.    . cetirizine (ZYRTEC) 10 MG tablet Take 10 mg by mouth daily as needed for allergies.     [name] doxycycline (VIBRA-TABS) 100 MG tablet Take 1 tablet (100 mg total) by mouth 2 (two) times daily. For 15 days. (Patient not taking: Reported on 01/18/2015) 30 tablet 3  . doxycycline (VIBRAMYCIN) 100 MG capsule     . guaiFENesin (MUCINEX) 600 MG 12 hr tablet Take 600 mg by mouth 2 (two) times daily as needed for to loosen phlegm.    [name] levofloxacin (LEVAQUIN) 500  MG tablet Take 1 tablet (500 mg total) by mouth daily. Start taking after doxycycline for 7 days then take 1 week off. (Patient not taking: Reported on 01/18/2015) 7 tablet 3  . predniSONE (DELTASONE) 10 MG tablet 5 tabs day 1, 4 tabs day 2, 3 tabs day 3, 2 tabs day 4 1 tab day 5 then discontinue (Patient not taking: Reported on 12/21/2014) 15 tablet 0  . senna-docusate (SENOKOT-S) 8.6-50 MG per tablet Take 1 tablet by mouth at bedtime as needed for mild constipation. (Patient not taking: Reported on 12/21/2014) 60 tablet 6  . traZODone (DESYREL) 100 MG tablet Take 1 tablet (100 mg total) by mouth at bedtime. (Patient not taking: Reported on 12/21/2014) 30 tablet 3   No current facility-administered medications for this visit.   Facility-Administered Medications Ordered in Other Visits  Medication Dose Route Frequency Provider Last Rate Last Dose  . sodium chloride 0.9 % injection 10 mL  10 mL Intravenous PRN Forest Gleason, MD   10 mL at 01/18/15 1115    OBJECTIVE:  Filed Vitals:   01/18/15 1022  BP: 122/82  Pulse: 92  Temp: 97.5 F (36.4 C)     Body mass index is 21.48 kg/(m^2).    ECOG FS:2 - Symptomatic, <50% confined to bed  PHYSICAL EXAM: Gen. Status: Patient is alert and oriented on oxygen. In  Wheelchair Head exam was generally normal. There was no scleral icterus or corneal arcus. Mucous membranes were moist. Abdomen: Soft liver and spleen not palpable no ascites Lungs: Emphysematous chest.  Bilateral rhonchi.  Right more than left Diminished and entry. Examination of the skin revealed no evidence of significant rashes, suspicious appearing nevi or other concerning lesions. Neurologically, the patient was awake, alert, and oriented to person, place and time. There were no obvious focal neurologic abnormalities. Cardiac exam revealed the PMI to be normally situated and sized. The rhythm was regular and no extrasystoles were noted during several minutes of auscultation. The first and  second heart sounds were normal and physiologic splitting of the second heart sound was noted. There were no murmurs, rubs, clicks, or gallops. Lymphatic system: No palpable supraclavicular or cervical adenopathy   LAB RESULTS:  Appointment on 01/18/2015  Component Date Value Ref Range Status  . WBC 01/18/2015 4.2  3.6 - 11.0 K/uL Final  . RBC 01/18/2015 3.71* 3.80 - 5.20 MIL/uL Final  . Hemoglobin 01/18/2015 12.6  12.0 - 16.0 g/dL Final  . HCT 01/18/2015 38.2  35.0 - 47.0 % Final  . MCV 01/18/2015 103.0* 80.0 - 100.0 fL Final  . MCH 01/18/2015 33.9  26.0 - 34.0 pg Final  . MCHC 01/18/2015 32.9  32.0 - 36.0 g/dL Final  . RDW 01/18/2015 15.3* 11.5 - 14.5 % Final  . Platelets 01/18/2015 169  150 - 440 K/uL Final  . Neutrophils Relative % 01/18/2015 53   Final  . Neutro Abs 01/18/2015 2.3  1.4 - 6.5 K/uL Final  . Lymphocytes Relative 01/18/2015 25   Final  . Lymphs Abs 01/18/2015 1.1  1.0 - 3.6 K/uL Final  . Monocytes Relative 01/18/2015 17   Final  . Monocytes Absolute 01/18/2015 0.7  0.2 - 0.9 K/uL Final  . Eosinophils Relative 01/18/2015 4   Final  . Eosinophils Absolute 01/18/2015 0.2  0 - 0.7 K/uL Final  . Basophils Relative 01/18/2015 1   Final  . Basophils Absolute 01/18/2015 0.1  0 - 0.1 K/uL Final  . Sodium 01/18/2015 136  135 - 145 mmol/L Final  . Potassium 01/18/2015 4.0  3.5 - 5.1 mmol/L Final  . Chloride 01/18/2015 104  101 - 111 mmol/L Final  . CO2 01/18/2015 27  22 - 32 mmol/L Final  . Glucose, Bld 01/18/2015 97  65 - 99 mg/dL Final  . BUN 01/18/2015 27* 6 - 20 mg/dL Final  . Creatinine, Ser 01/18/2015 1.33* 0.44 - 1.00 mg/dL Final  . Calcium 01/18/2015 9.0  8.9 - 10.3 mg/dL Final  . Total Protein 01/18/2015 6.9  6.5 - 8.1 g/dL Final  . Albumin 01/18/2015 4.1  3.5 - 5.0 g/dL Final  . AST 01/18/2015 15  15 - 41 U/L Final  . ALT 01/18/2015 14  14 - 54 U/L Final  . Alkaline Phosphatase 01/18/2015 76  38 - 126 U/L Final  . Total Bilirubin 01/18/2015 0.7  0.3 -  1.2  mg/dL Final  . GFR calc non Af Amer 01/18/2015 39* >60 mL/min Final  . GFR calc Af Amer 01/18/2015 45* >60 mL/min Final   Comment: (NOTE) The eGFR has been calculated using the CKD EPI equation. This calculation has not been validated in all clinical situations. eGFR's persistently <60 mL/min signify possible Chronic Kidney Disease.   . Anion gap 01/18/2015 5  5 - 15 Final      STUDIES: Nm Pet Image Restag (ps) Skull Base To Thigh  12/20/2014   CLINICAL DATA:  Subsequent treatment strategy for lymphoma.  EXAM: NUCLEAR MEDICINE PET SKULL BASE TO THIGH  TECHNIQUE: 11.4 mCi F-18 FDG was injected intravenously. Full-ring PET imaging was performed from the skull base to thigh after the radiotracer. CT data was obtained and used for attenuation correction and anatomic localization.  FASTING BLOOD GLUCOSE:  Value: 73 mg/dl  COMPARISON:  01/18/2014  FINDINGS: NECK  Glottic activity is likely physiologic. Mild chronic bilateral maxillary sinusitis. Metal foreign body in the right upper preseptal periorbital space, image 5 series 3.  CHEST  Stable right paramediastinal fibrosis. Reduced size of the right loculated pleural effusion.  Ground-glass opacity in the left lower lobe associated with hypermetabolic activity, measuring up to 4.8. There is previously no hypermetabolic activity in this vicinity.  The prior mucoid impaction in the lingula has improved. There is scattered scarring in both lungs.  ABDOMEN/PELVIS  No abnormal hypermetabolic activity within the liver, pancreas, adrenal glands, or spleen. No hypermetabolic lymph nodes in the abdomen or pelvis.  Aortoiliac atherosclerotic vascular disease. Sigmoid colon diverticulosis.  SKELETON  Left proximal humeral fracture with hypermetabolic activity in this vicinity measuring up to 4.0. This activity is mostly along the fracture margins. Incidental note is made of extravasation of radiopharmaceutical in the left antecubital fossa.  IMPRESSION: 1.  Hypermetabolic activity associated with ground-glass opacity in the left lower lobe, probably inflammatory rather than representing lymphomatous infiltration of the lung. This may merit observation to ensure resolution. From a technical standpoint this qualifies as [place] 4. 2. Essentially stable right paramediastinal fibrosis. 3. Increased metabolic activity associated with the surgical neck fracture of the left proximal humerus which appears to be from benign healing response. 4. Chronic bilateral maxillary sinusitis. 5. Metal foreign body in the right upper lateral periorbital soft tissues, thought to be immediately preseptal. Depending on the nature this foreign body, it may preclude MRI.   Electronically Signed   By: Van Clines M.D.   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  Patient is getting alternate that doxycycline with Levaquin tolerating very well and did not have any recurrent pulmonary infection we will continue this program.  2 months and reevaluate patient d  Patient expressed understanding and was in agreement with this plan. She also understands that She can call clinic at any time with any questions, concerns, or complaints.    No matching staging information was found for the patient.  Forest Gleason, MD   01/18/2015 11:24 AM

## 2015-01-27 ENCOUNTER — Telehealth: Payer: Self-pay | Admitting: *Deleted

## 2015-01-27 MED ORDER — ONDANSETRON HCL 4 MG PO TABS: 4.0000 mg | ORAL_TABLET | Freq: Three times a day (TID) | ORAL | Status: DC | PRN

## 2015-01-27 NOTE — Telephone Encounter (Signed)
Escribed

## 2015-01-31 ENCOUNTER — Telehealth: Payer: Self-pay | Admitting: *Deleted

## 2015-01-31 MED ORDER — CITALOPRAM HYDROBROMIDE 20 MG PO TABS: 20.0000 mg | ORAL_TABLET | Freq: Every day | ORAL | Status: DC

## 2015-01-31 NOTE — Telephone Encounter (Signed)
Escribed

## 2015-02-10 ENCOUNTER — Other Ambulatory Visit: Payer: Self-pay | Admitting: *Deleted

## 2015-02-10 DIAGNOSIS — C8590 Non-Hodgkin lymphoma, unspecified, unspecified site: Secondary | ICD-10-CM

## 2015-02-15 ENCOUNTER — Inpatient Hospital Stay: Payer: Medicare Other | Attending: Oncology | Admitting: Oncology

## 2015-02-15 ENCOUNTER — Encounter: Payer: Self-pay | Admitting: Oncology

## 2015-02-15 ENCOUNTER — Inpatient Hospital Stay: Payer: Medicare Other

## 2015-02-15 VITALS — BP 113/77 | HR 89 | Temp 96.7°F | Wt 122.1 lb

## 2015-02-15 DIAGNOSIS — Z9221 Personal history of antineoplastic chemotherapy: Secondary | ICD-10-CM | POA: Diagnosis not present

## 2015-02-15 DIAGNOSIS — C8590 Non-Hodgkin lymphoma, unspecified, unspecified site: Secondary | ICD-10-CM

## 2015-02-15 DIAGNOSIS — Z923 Personal history of irradiation: Secondary | ICD-10-CM

## 2015-02-15 DIAGNOSIS — J45909 Unspecified asthma, uncomplicated: Secondary | ICD-10-CM | POA: Insufficient documentation

## 2015-02-15 DIAGNOSIS — Z79899 Other long term (current) drug therapy: Secondary | ICD-10-CM | POA: Insufficient documentation

## 2015-02-15 DIAGNOSIS — C8330 Diffuse large B-cell lymphoma, unspecified site: Secondary | ICD-10-CM

## 2015-02-15 DIAGNOSIS — Z949 Transplanted organ and tissue status, unspecified: Secondary | ICD-10-CM | POA: Diagnosis not present

## 2015-02-15 DIAGNOSIS — C801 Malignant (primary) neoplasm, unspecified: Secondary | ICD-10-CM

## 2015-02-15 DIAGNOSIS — D630 Anemia in neoplastic disease: Secondary | ICD-10-CM | POA: Insufficient documentation

## 2015-02-15 DIAGNOSIS — J9600 Acute respiratory failure, unspecified whether with hypoxia or hypercapnia: Secondary | ICD-10-CM | POA: Diagnosis not present

## 2015-02-15 LAB — CBC WITH DIFFERENTIAL/PLATELET
Basophils Absolute: 0 10*3/uL (ref 0–0.1)
Basophils Relative: 1 %
EOS ABS: 0.1 10*3/uL (ref 0–0.7)
Eosinophils Relative: 2 %
HEMATOCRIT: 36.6 % (ref 35.0–47.0)
HEMOGLOBIN: 12.1 g/dL (ref 12.0–16.0)
LYMPHS ABS: 1.2 10*3/uL (ref 1.0–3.6)
LYMPHS PCT: 15 %
MCH: 34 pg (ref 26.0–34.0)
MCHC: 33.1 g/dL (ref 32.0–36.0)
MCV: 102.9 fL — AB (ref 80.0–100.0)
Monocytes Absolute: 0.7 10*3/uL (ref 0.2–0.9)
Monocytes Relative: 9 %
NEUTROS PCT: 73 %
Neutro Abs: 6 10*3/uL (ref 1.4–6.5)
PLATELETS: 165 10*3/uL (ref 150–440)
RBC: 3.56 MIL/uL — AB (ref 3.80–5.20)
RDW: 14.6 % — ABNORMAL HIGH (ref 11.5–14.5)
WBC: 8 10*3/uL (ref 3.6–11.0)

## 2015-02-15 LAB — COMPREHENSIVE METABOLIC PANEL
ALBUMIN: 3.8 g/dL (ref 3.5–5.0)
ALT: 10 U/L — AB (ref 14–54)
AST: 15 U/L (ref 15–41)
Alkaline Phosphatase: 64 U/L (ref 38–126)
Anion gap: 6 (ref 5–15)
BILIRUBIN TOTAL: 0.7 mg/dL (ref 0.3–1.2)
BUN: 28 mg/dL — AB (ref 6–20)
CALCIUM: 8.7 mg/dL — AB (ref 8.9–10.3)
CO2: 25 mmol/L (ref 22–32)
Chloride: 105 mmol/L (ref 101–111)
Creatinine, Ser: 1.09 mg/dL — ABNORMAL HIGH (ref 0.44–1.00)
GFR, EST AFRICAN AMERICAN: 58 mL/min — AB (ref >60)
GFR, EST NON AFRICAN AMERICAN: 50 mL/min — AB (ref >60)
Glucose, Bld: 100 mg/dL — ABNORMAL HIGH (ref 65–99)
Potassium: 3.5 mmol/L (ref 3.5–5.1)
Sodium: 136 mmol/L (ref 135–145)
Total Protein: 6.6 g/dL (ref 6.5–8.1)

## 2015-02-15 MED ORDER — HEPARIN SOD (PORK) LOCK FLUSH 100 UNIT/ML IV SOLN
INTRAVENOUS | Status: AC
  Filled 2015-02-15: qty 5

## 2015-02-15 MED ORDER — HEPARIN SOD (PORK) LOCK FLUSH 100 UNIT/ML IV SOLN
500.0000 [IU] | Freq: Once | INTRAVENOUS | Status: AC
  Administered 2015-02-15: 500 [IU] via INTRAVENOUS

## 2015-02-15 MED ORDER — SODIUM CHLORIDE 0.9 % IJ SOLN
10.0000 mL | Freq: Once | INTRAMUSCULAR | Status: AC
  Administered 2015-02-15: 10 mL via INTRAVENOUS
  Filled 2015-02-15: qty 10

## 2015-02-15 MED ORDER — DOXYCYCLINE HYCLATE 100 MG PO TABS: 100.0000 mg | ORAL_TABLET | Freq: Every day | ORAL | Status: DC

## 2015-02-15 NOTE — Progress Notes (Signed)
[place] @ [facility] [phone]  Fax:(336) [place]: [birth year]  MR#: [id number]  FYB#:[id number]  Patient Care Team: Madelyn Brunner, MD as PCP - General (Unknown Physician Specialty) Madelyn Brunner, MD as Referring Physician (Unknown Physician Specialty) Forest Gleason, MD (Unknown Physician Specialty) Christene Lye, MD (General Surgery)  CHIEF COMPLAINT:  Chief Complaint  Patient presents with  . Follow-up    Oncology History   1.  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  Status post 6 cycles of chemotherapy with CHOP9nd involved field radiation treatment 18,00 rads to the mantle field  and mediasinal boost 2340. finished radiation and chemotherapy in November of 2000 residual mediastinotomy mass 2 cm 2.  In May of 2001 progressive disease with mediastinal mass becoming bigger 8 cm3.  The patient underwent 2 cycles of chemotherapy withRituxan, and ICE.(June of 2001) [facility]and [facility]dose chemotherapy with  BEAM finished treatment in October of 2001. patient had significant pancytopenia as well as fever lasting for 3 months after transplant. Recurrent sinus infection and pulmonary infection 3.  Patient  underwent EUS biopsy of which was negative .  (November, 2012) 4.  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  Started on RICE october 2014 6.ppatient is now being considered for high-dose chemotherapy and stem cell support (March, 2015) 7.2nd stem cell transplant cannot be done because of the  no stem cell to harvest     Lymphoma, non-Hodgkin's   06/30/2013 Initial Diagnosis Lymphoma, non-Hodgkin's    Oncology Flowsheet 12/02/2014 12/03/2014 12/03/2014 12/03/2014 12/03/2014 12/04/2014 12/05/2014  enoxaparin (LOVENOX) [place]   40 mg       40 mg -  methylPREDNISolone sodium  succinate 125 mg/2 mL (SOLU-MEDROL) IV - - - - - - -  ondansetron (ZOFRAN) IV 4 mg 4 mg 4 mg 4 mg 4 mg - -  predniSONE (DELTASONE) PO   40 mg       40 mg 40 mg    INTERVAL HISTORY:   69year old lady with recurrent diffuse large cell lymphoma.  Came today for the follow-up.  No chills.  No fever.  Patient had a PET scan done patient also had a recurrent pulmonary infection with multiple admission in the hospital.  During last admission patient underwent bronchoscopy without any specific organisms identified. Gradually now recovering.  February 15, 2015 Patient is here for ongoing evaluation regarding recurrent diffuse large cell lymphoma.  Recurrent pulmonary infection.  Patient is on oxygen.  Since last evaluation patient has been put on doxycycline 100 mg twice a day for 15 days followed by Levaquin and then one week off.  Since prophylactic antibiotic was started patient did not have any further pulmonary infection.  No chills.  No fever.  Appetite is improving.  REVIEW OF SYSTEMS:   No status: Patient is feeling weak and tired.  No chills.  No fever. Lungs: On oxygen.  Shortness of breath on exertion.  No cough or expectoration  Cardiac: No chest pain. Gastro intestinal system: Patient complains of mild nausea with doxycycline No heartburn.  No nausea or vomiting.  No abdominal pain.  No diarrhea.  No constipation.  No rectal bleeding. Neurological system: No dizziness.  No tingling.  His was.  no tingling numbness.  no focal weakness or  any focal signs. Skin: No evidence of ecchymosis or rash. Lower extremity no edema Skin no skeletal system no bony pain. Is and did not have any pulmonary infection in last 4 weeks.  Had dizziness with Levaquin but started taking in the night and did not have any problem. As per HPI. Otherwise, a complete review of systems is negatve.  PAST MEDICAL HISTORY: Past Medical History  Diagnosis Date  . Non Hodgkin's lymphoma   . Asthma   . Acute  respiratory failure 11/29/2014  . Multiple myeloma 12/01/2014  . 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 Nasal sinus surgery  08/07/05 09/09/05    x 2     FAMILY HISTORY Family History  Problem Relation Age of Onset  . Asthma Son     ADVANCED DIRECTIVES:  Patient does have advanced health care directive HEALTH MAINTENANCE: History  Substance Use Topics  . Smoking status: Never Smoker   . Smokeless tobacco: Never Used  . Alcohol Use: Yes      Allergies  Allergen Reactions  . Hydrocodone-Homatropine Itching  . Tussionex Pennkinetic Er [Hydrocod Polst-Cpm Polst Er] Itching  . Ciprofloxacin Rash    Current Outpatient Prescriptions  Medication Sig Dispense Refill  . albuterol (PROVENTIL HFA;VENTOLIN HFA) 108 (90 BASE) MCG/ACT inhaler Inhale 1 puff into the lungs every 6 (six) hours as needed for wheezing or shortness of breath.    [name] aspirin EC 81 MG tablet Take 81 mg by mouth daily.    . cetirizine (ZYRTEC) 10 MG tablet Take 10 mg by mouth daily as needed for allergies.     . citalopram (CELEXA) 20 MG tablet Take 1 tablet (20 mg total) by mouth daily. 30 tablet 3  . doxycycline (VIBRA-TABS) 100 MG tablet Take 1 tablet (100 mg total) by mouth at bedtime. For 15 days. 30 tablet 3  . Fluticasone-Salmeterol (ADVAIR DISKUS) 250-50 MCG/DOSE AEPB Inhale 1 puff into the lungs 2 (two) times daily.     [name] guaiFENesin (MUCINEX) 600 MG 12 hr tablet Take 600 mg by mouth 2 (two) times daily as needed for to loosen phlegm.    [name] levofloxacin (LEVAQUIN) 500 MG tablet Take 1 tablet (500 mg total) by mouth daily. Start taking after doxycycline for 7 days then take 1 week off. 7 tablet 3  . levothyroxine (SYNTHROID, LEVOTHROID) 25 MCG tablet Take 25 mcg by mouth daily.      . ondansetron (ZOFRAN) 4 MG tablet Take 1 tablet (4 mg total) by mouth every 8 (eight) hours as needed for nausea  or vomiting. 30 tablet 1  . pantoprazole (PROTONIX) 40 MG tablet Take 1 tablet (40 mg total) by mouth at bedtime. 1 tablet 6  . potassium chloride SA (K-DUR,KLOR-CON) 20 MEQ tablet Take 20 mEq by mouth 2 (two) times daily.    . predniSONE (DELTASONE) 10 MG tablet 5 tabs day 1, 4 tabs day 2, 3 tabs day 3, 2 tabs day 4 1 tab day 5 then discontinue 15 tablet 0  . senna-docusate (SENOKOT-S) 8.6-50 MG per tablet Take 1 tablet by mouth at bedtime as needed for mild constipation. 60 tablet 6  . traZODone (DESYREL) 100 MG tablet Take 1 tablet (100 mg total) by mouth at bedtime. 30 tablet 3   No current facility-administered medications for this visit.    OBJECTIVE:  Filed Vitals:   02/15/15 1126  BP: 113/77  Pulse: 89  Temp: 96.7 F (35.9 C)     Body mass index is 21.64 kg/(m^2).    ECOG FS:2 - Symptomatic, <50% confined to bed  PHYSICAL EXAM: Gen. Status: Patient is alert and oriented on oxygen. In  Wheelchair Head exam was generally normal. There was no scleral icterus or corneal arcus. Mucous membranes were moist. Abdomen: Soft liver and spleen not palpable no ascites Lungs: Emphysematous chest.  Bilateral rhonchi.  Right more than left Diminished and entry. Examination of the skin revealed no evidence of significant rashes, suspicious appearing nevi or other concerning lesions. Neurologically, the patient was awake, alert, and oriented to person, place and time. There were no obvious focal neurologic abnormalities. Cardiac exam revealed the PMI to be normally situated and sized. The rhythm was regular and no extrasystoles were noted during several minutes of auscultation. The first and second heart sounds were normal and physiologic splitting of the second heart sound was noted. There were no murmurs, rubs, clicks, or gallops. Lymphatic system: No palpable supraclavicular or cervical adenopathy   LAB RESULTS:  Appointment on 02/15/2015  Component Date Value Ref Range Status  . WBC  02/15/2015 8.0  3.6 - 11.0 K/uL Final  . RBC 02/15/2015 3.56* 3.80 - 5.20 MIL/uL Final  . Hemoglobin 02/15/2015 12.1  12.0 - 16.0 g/dL Final  . HCT 02/15/2015 36.6  35.0 - 47.0 % Final  . MCV 02/15/2015 102.9* 80.0 - 100.0 fL Final  . MCH 02/15/2015 34.0  26.0 - 34.0 pg Final  . MCHC 02/15/2015 33.1  32.0 - 36.0 g/dL Final  . RDW 02/15/2015 14.6* 11.5 - 14.5 % Final  . Platelets 02/15/2015 165  150 - 440 K/uL Final  . Neutrophils Relative % 02/15/2015 73   Final  . Neutro Abs 02/15/2015 6.0  1.4 - 6.5 K/uL Final  . Lymphocytes Relative 02/15/2015 15   Final  . Lymphs Abs 02/15/2015 1.2  1.0 - 3.6 K/uL Final  . Monocytes Relative 02/15/2015 9   Final  . Monocytes Absolute 02/15/2015 0.7  0.2 - 0.9 K/uL Final  . Eosinophils Relative 02/15/2015 2   Final  . Eosinophils Absolute 02/15/2015 0.1  0 - 0.7 K/uL Final  . Basophils Relative 02/15/2015 1   Final  . Basophils Absolute 02/15/2015 0.0  0 - 0.1 K/uL Final  . Sodium 02/15/2015 136  135 - 145 mmol/L Final  . Potassium 02/15/2015 3.5  3.5 - 5.1 mmol/L Final  . Chloride 02/15/2015 105  101 - 111 mmol/L Final  . CO2 02/15/2015 25  22 - 32 mmol/L Final  . Glucose, Bld 02/15/2015 100* 65 - 99 mg/dL Final  . BUN 02/15/2015 28* 6 - 20 mg/dL Final  . Creatinine, Ser 02/15/2015 1.09* 0.44 - 1.00 mg/dL Final  . Calcium 02/15/2015 8.7* 8.9 - 10.3 mg/dL Final  . Total Protein 02/15/2015 6.6  6.5 - 8.1 g/dL Final  . Albumin 02/15/2015 3.8  3.5 - 5.0 g/dL Final  . AST 02/15/2015 15  15 - 41 U/L Final  . ALT 02/15/2015 10* 14 - 54 U/L Final  . Alkaline Phosphatase 02/15/2015 64  38 - 126 U/L Final  . Total Bilirubin 02/15/2015 0.7  0.3 - 1.2 mg/dL Final  . GFR calc non Af Amer 02/15/2015 50* >60 mL/min Final  . GFR calc Af Amer 02/15/2015 58* >60 mL/min Final   Comment: (NOTE) The eGFR has been calculated using the CKD EPI equation. This calculation has not been validated in all clinical situations. eGFR's  persistently <60 mL/min signify  possible Chronic Kidney Disease.   . 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 Because of nausea associated with doxycycline patient was advised to take doxycycline orally once a day  MEDICAL DECISION MAKING: Lab data has been reviewed which is stable.  Patient is getting alternate that doxycycline with Levaquin tolerating very well and did not have any recurrent pulmonary infection we will continue this program.  2 months and reevaluate patient d  Patient expressed understanding and was in agreement with this plan. She also understands that She can call clinic at any time with any questions, concerns, or complaints.    No matching staging information was found for the patient.  Forest Gleason, MD   02/15/2015 5:10 PM

## 2015-02-15 NOTE — Progress Notes (Signed)
Patient does have living will. Never smoked. 

## 2015-03-29 ENCOUNTER — Inpatient Hospital Stay: Payer: Medicare Other | Attending: Oncology

## 2015-03-29 ENCOUNTER — Inpatient Hospital Stay (HOSPITAL_BASED_OUTPATIENT_CLINIC_OR_DEPARTMENT_OTHER): Payer: Medicare Other | Admitting: Oncology

## 2015-03-29 ENCOUNTER — Encounter: Payer: Self-pay | Admitting: Oncology

## 2015-03-29 ENCOUNTER — Inpatient Hospital Stay: Payer: Medicare Other

## 2015-03-29 VITALS — BP 128/75 | HR 84 | Temp 97.7°F | Wt 124.0 lb

## 2015-03-29 DIAGNOSIS — Z923 Personal history of irradiation: Secondary | ICD-10-CM | POA: Diagnosis not present

## 2015-03-29 DIAGNOSIS — R110 Nausea: Secondary | ICD-10-CM | POA: Diagnosis not present

## 2015-03-29 DIAGNOSIS — J45909 Unspecified asthma, uncomplicated: Secondary | ICD-10-CM

## 2015-03-29 DIAGNOSIS — Z9481 Bone marrow transplant status: Secondary | ICD-10-CM | POA: Insufficient documentation

## 2015-03-29 DIAGNOSIS — Z9221 Personal history of antineoplastic chemotherapy: Secondary | ICD-10-CM

## 2015-03-29 DIAGNOSIS — Z452 Encounter for adjustment and management of vascular access device: Secondary | ICD-10-CM | POA: Insufficient documentation

## 2015-03-29 DIAGNOSIS — C801 Malignant (primary) neoplasm, unspecified: Secondary | ICD-10-CM

## 2015-03-29 DIAGNOSIS — R5383 Other fatigue: Secondary | ICD-10-CM

## 2015-03-29 DIAGNOSIS — Z79899 Other long term (current) drug therapy: Secondary | ICD-10-CM | POA: Diagnosis not present

## 2015-03-29 DIAGNOSIS — Z1231 Encounter for screening mammogram for malignant neoplasm of breast: Secondary | ICD-10-CM

## 2015-03-29 DIAGNOSIS — R531 Weakness: Secondary | ICD-10-CM | POA: Insufficient documentation

## 2015-03-29 DIAGNOSIS — C8330 Diffuse large B-cell lymphoma, unspecified site: Secondary | ICD-10-CM | POA: Diagnosis not present

## 2015-03-29 DIAGNOSIS — D630 Anemia in neoplastic disease: Secondary | ICD-10-CM

## 2015-03-29 DIAGNOSIS — Z7982 Long term (current) use of aspirin: Secondary | ICD-10-CM | POA: Insufficient documentation

## 2015-03-29 DIAGNOSIS — C8590 Non-Hodgkin lymphoma, unspecified, unspecified site: Secondary | ICD-10-CM

## 2015-03-29 LAB — CBC WITH DIFFERENTIAL/PLATELET
BASOS PCT: 1 %
Basophils Absolute: 0 10*3/uL (ref 0–0.1)
EOS PCT: 4 %
Eosinophils Absolute: 0.1 10*3/uL (ref 0–0.7)
HCT: 36.9 % (ref 35.0–47.0)
Hemoglobin: 12.6 g/dL (ref 12.0–16.0)
LYMPHS ABS: 1.2 10*3/uL (ref 1.0–3.6)
Lymphocytes Relative: 31 %
MCH: 34.8 pg — AB (ref 26.0–34.0)
MCHC: 34.2 g/dL (ref 32.0–36.0)
MCV: 101.9 fL — ABNORMAL HIGH (ref 80.0–100.0)
Monocytes Absolute: 0.6 10*3/uL (ref 0.2–0.9)
Monocytes Relative: 15 %
Neutro Abs: 1.9 10*3/uL (ref 1.4–6.5)
Neutrophils Relative %: 49 %
PLATELETS: 159 10*3/uL (ref 150–440)
RBC: 3.62 MIL/uL — AB (ref 3.80–5.20)
RDW: 14 % (ref 11.5–14.5)
WBC: 3.8 10*3/uL (ref 3.6–11.0)

## 2015-03-29 LAB — LACTATE DEHYDROGENASE: LDH: 125 U/L (ref 98–192)

## 2015-03-29 LAB — COMPREHENSIVE METABOLIC PANEL
ALBUMIN: 4 g/dL (ref 3.5–5.0)
ALT: 12 U/L — ABNORMAL LOW (ref 14–54)
ANION GAP: 4 — AB (ref 5–15)
AST: 14 U/L — ABNORMAL LOW (ref 15–41)
Alkaline Phosphatase: 63 U/L (ref 38–126)
BUN: 35 mg/dL — ABNORMAL HIGH (ref 6–20)
CO2: 25 mmol/L (ref 22–32)
Calcium: 8.9 mg/dL (ref 8.9–10.3)
Chloride: 107 mmol/L (ref 101–111)
Creatinine, Ser: 1.07 mg/dL — ABNORMAL HIGH (ref 0.44–1.00)
GFR calc Af Amer: 59 mL/min — ABNORMAL LOW (ref >60)
GFR calc non Af Amer: 51 mL/min — ABNORMAL LOW (ref >60)
GLUCOSE: 73 mg/dL (ref 65–99)
Potassium: 3.7 mmol/L (ref 3.5–5.1)
SODIUM: 136 mmol/L (ref 135–145)
Total Bilirubin: 0.7 mg/dL (ref 0.3–1.2)
Total Protein: 6.6 g/dL (ref 6.5–8.1)

## 2015-03-29 MED ORDER — HEPARIN SOD (PORK) LOCK FLUSH 100 UNIT/ML IV SOLN
500.0000 [IU] | Freq: Once | INTRAVENOUS | Status: AC
  Administered 2015-03-29: 500 [IU] via INTRAVENOUS

## 2015-03-29 MED ORDER — HEPARIN SOD (PORK) LOCK FLUSH 100 UNIT/ML IV SOLN
INTRAVENOUS | Status: AC
  Filled 2015-03-29: qty 5

## 2015-03-29 MED ORDER — SODIUM CHLORIDE 0.9 % IJ SOLN
10.0000 mL | INTRAMUSCULAR | Status: DC | PRN
  Administered 2015-03-29: 10 mL via INTRAVENOUS
  Filled 2015-03-29: qty 10

## 2015-03-29 NOTE — Progress Notes (Signed)
Patient does have living will.  Former smoker. 

## 2015-03-29 NOTE — Progress Notes (Signed)
[place] @ [facility] Telephone:(336) [phone]  Fax:(336) [place]: 06/05/1946  MR#: [id number]  XKP#:[id number]  Patient Care Team: Madelyn Brunner, MD as PCP - General (Unknown Physician Specialty) Madelyn Brunner, MD as Referring Physician (Unknown Physician Specialty) Forest Gleason, MD (Unknown Physician Specialty) Christene Lye, MD (General Surgery)  CHIEF COMPLAINT:  Chief Complaint  Patient presents with  . Follow-up   Oncology History   1.  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  Status post 6 cycles of chemotherapy with CHOP9nd involved field radiation treatment 18,00 rads to the mantle field  and mediasinal boost 2340. finished radiation and chemotherapy in November of 2000 residual mediastinotomy mass 2 cm 2.  In May of 2001 progressive disease with mediastinal mass becoming bigger 8 cm3.  The patient underwent 2 cycles of chemotherapy withRituxan, and ICE.(June of 2001) [facility]and high-dose chemotherapy with  BEAM finished treatment in October of 2001. patient had significant pancytopenia as well as fever lasting for 3 months after transplant. Recurrent sinus infection and pulmonary infection 3.  Patient  underwent EUS biopsy of which was negative .  (November, 2012) 4.  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  Came today for the follow-up.  No chills.  No fever.  Patient had a PET scan done patient also had a recurrent pulmonary infection with multiple admission in the hospital.  During last admission patient underwent bronchoscopy without any specific organisms identified. Gradually now recovering.  February 15, 2015 Patient is here for ongoing evaluation regarding recurrent diffuse large cell lymphoma.  Recurrent pulmonary infection.  Patient is on oxygen.  Since last evaluation patient has been put on doxycycline 100 mg twice a day for 15 days followed by Levaquin and then one week off.  Since prophylactic antibiotic was started patient did not have any further pulmonary infection.  No chills.  No fever.  Appetite is improving. September, 2016 Since last evaluation patient did not have any pulmonary infection patient.  Patient is taking doxycycline 2 weeks Cipro 1 week and then 1 week off and cycle being repeated.  Patient was also advised to get a flu vaccine.  REVIEW OF SYSTEMS:   No status: Patient is feeling weak and tired.  No chills.  No fever. Lungs: On oxygen.  Shortness of breath on exertion.  No cough or expectoration  Cardiac: No chest pain. Gastro intestinal system: Patient complains of mild nausea with doxycycline No heartburn.  No nausea or vomiting.  No abdominal pain.  No diarrhea.  No constipation.  No rectal bleeding. Neurological system: No dizziness.  No tingling.  His was.  no tingling numbness.  no focal weakness or any focal signs. Skin: No evidence of ecchymosis or rash. Lower extremity no edema Skin no skeletal system no bony pain. Is and did not have any pulmonary infection in last 4 weeks.  Had dizziness with Levaquin but started taking in the  night and did not have any problem. As per HPI. Otherwise, a complete review of systems is negatve.  PAST MEDICAL HISTORY: Past Medical History  Diagnosis Date  . Non Hodgkin's lymphoma   . Asthma   . Acute respiratory failure 11/29/2014  . Multiple myeloma 12/01/2014  . 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 Nasal sinus surgery  08/07/05 09/09/05    x 2     FAMILY HISTORY Family History  Problem Relation Age of Onset  . Asthma Son     ADVANCED DIRECTIVES:  Patient does have advanced health care directive HEALTH MAINTENANCE: Social History  Substance Use Topics  . Smoking status: Never Smoker   . Smokeless tobacco: Never Used  . Alcohol Use: Yes      Allergies  Allergen Reactions  . Hydrocodone-Homatropine Itching  . Tussionex Pennkinetic Er [Hydrocod Polst-Cpm Polst Er] Itching  . Ciprofloxacin Rash    Current Outpatient Prescriptions  Medication Sig Dispense Refill  . albuterol (PROVENTIL HFA;VENTOLIN HFA) 108 (90 BASE) MCG/ACT inhaler Inhale 1 puff into the lungs every 6 (six) hours as needed for wheezing or shortness of breath.    [name] aspirin EC 81 MG tablet Take 81 mg by mouth daily.    . cetirizine (ZYRTEC) 10 MG tablet Take 10 mg by mouth daily as needed for allergies.     . citalopram (CELEXA) 20 MG tablet Take 1 tablet (20 mg total) by mouth daily. 30 tablet 3  . doxycycline (VIBRA-TABS) 100 MG tablet Take 1 tablet (100 mg total) by mouth at bedtime. For 15 days. 30 tablet 3  . Fluticasone-Salmeterol (ADVAIR DISKUS) 250-50 MCG/DOSE AEPB Inhale 1 puff into the lungs 2 (two) times daily.     [name] guaiFENesin (MUCINEX) 600 MG 12 hr tablet Take 600 mg by mouth 2 (two) times daily as needed for to loosen phlegm.    [name] levofloxacin (LEVAQUIN) 500 MG tablet Take 1 tablet (500 mg total) by mouth daily. Start taking after doxycycline for 7 days then take 1 week  off. 7 tablet 3  . levothyroxine (SYNTHROID, LEVOTHROID) 25 MCG tablet Take 25 mcg by mouth daily.      . ondansetron (ZOFRAN) 4 MG tablet Take 1 tablet (4 mg total) by mouth every 8 (eight) hours as needed for nausea or vomiting. 30 tablet 1  . pantoprazole (PROTONIX) 40 MG tablet Take 1 tablet (40 mg total) by mouth at bedtime. 1 tablet 6  . potassium chloride SA (K-DUR,KLOR-CON) 20 MEQ tablet Take 20 mEq by mouth 2 (two) times daily.    . predniSONE (DELTASONE) 10 MG tablet 5 tabs day 1, 4 tabs day 2, 3 tabs day 3, 2 tabs day 4 1 tab day 5 then discontinue 15 tablet 0  . senna-docusate (SENOKOT-S) 8.6-50 MG per tablet Take 1 tablet by mouth at bedtime as needed for mild constipation. 60 tablet 6  . traZODone (DESYREL) 100 MG  tablet Take 1 tablet (100 mg total) by mouth at bedtime. 30 tablet 3   No current facility-administered medications for this visit.    OBJECTIVE:  Filed Vitals:   03/29/15 1153  BP: 128/75  Pulse: 84  Temp: 97.7 F (36.5 C)     Body mass index is 21.97 kg/(m^2).    ECOG FS:2 - Symptomatic, <50% confined to bed  PHYSICAL EXAM: Gen. Status: Patient is alert and oriented on oxygen. In  Wheelchair Head exam was generally normal. There was no scleral icterus or corneal arcus. Mucous membranes were moist. Abdomen: Soft liver and spleen not palpable no ascites Lungs: Emphysematous chest.  Bilateral rhonchi.  Right more than left Diminished and entry. Examination of the skin revealed no evidence of significant rashes, suspicious appearing nevi or other concerning lesions. Neurologically, the patient was awake, alert, and oriented to person, place and time. There were no obvious focal neurologic abnormalities. Cardiac exam revealed the PMI to be normally situated and sized. The rhythm was regular and no extrasystoles were noted during several minutes of auscultation. The first and second heart sounds were normal and physiologic splitting of the second heart sound was  noted. There were no murmurs, rubs, clicks, or gallops. Lymphatic system: No palpable supraclavicular or cervical adenopathy   LAB RESULTS:  Appointment on 03/29/2015  Component Date Value Ref Range Status  . WBC 03/29/2015 3.8  3.6 - 11.0 K/uL Final  . RBC 03/29/2015 3.62* 3.80 - 5.20 MIL/uL Final  . Hemoglobin 03/29/2015 12.6  12.0 - 16.0 g/dL Final  . HCT 03/29/2015 36.9  35.0 - 47.0 % Final  . MCV 03/29/2015 101.9* 80.0 - 100.0 fL Final  . MCH 03/29/2015 34.8* 26.0 - 34.0 pg Final  . MCHC 03/29/2015 34.2  32.0 - 36.0 g/dL Final  . RDW 03/29/2015 14.0  11.5 - 14.5 % Final  . Platelets 03/29/2015 159  150 - 440 K/uL Final  . Neutrophils Relative % 03/29/2015 49   Final  . Neutro Abs 03/29/2015 1.9  1.4 - 6.5 K/uL Final  . Lymphocytes Relative 03/29/2015 31   Final  . Lymphs Abs 03/29/2015 1.2  1.0 - 3.6 K/uL Final  . Monocytes Relative 03/29/2015 15   Final  . Monocytes Absolute 03/29/2015 0.6  0.2 - 0.9 K/uL Final  . Eosinophils Relative 03/29/2015 4   Final  . Eosinophils Absolute 03/29/2015 0.1  0 - 0.7 K/uL Final  . Basophils Relative 03/29/2015 1   Final  . Basophils Absolute 03/29/2015 0.0  0 - 0.1 K/uL Final  . Sodium 03/29/2015 136  135 - 145 mmol/L Final  . Potassium 03/29/2015 3.7  3.5 - 5.1 mmol/L Final  . Chloride 03/29/2015 107  101 - 111 mmol/L Final  . CO2 03/29/2015 25  22 - 32 mmol/L Final  . Glucose, Bld 03/29/2015 73  65 - 99 mg/dL Final  . BUN 03/29/2015 35* 6 - 20 mg/dL Final  . Creatinine, Ser 03/29/2015 1.07* 0.44 - 1.00 mg/dL Final  . Calcium 03/29/2015 8.9  8.9 - 10.3 mg/dL Final  . Total Protein 03/29/2015 6.6  6.5 - 8.1 g/dL Final  . Albumin 03/29/2015 4.0  3.5 - 5.0 g/dL Final  . AST 03/29/2015 14* 15 - 41 U/L Final  . ALT 03/29/2015 12* 14 - 54 U/L Final  . Alkaline Phosphatase 03/29/2015 63  38 - 126 U/L Final  . Total Bilirubin 03/29/2015 0.7  0.3 - 1.2 mg/dL Final  . GFR calc non Af Amer 03/29/2015 51* >  60 mL/min Final  . GFR calc Af Amer  03/29/2015 59* >60 mL/min Final   Comment: (NOTE) The eGFR has been calculated using the CKD EPI equation. This calculation has not been validated in all clinical situations. eGFR's persistently <60 mL/min signify possible Chronic Kidney Disease.   . Anion gap 03/29/2015 4* 5 - 15 Final  . LDH 03/29/2015 125  98 - 192 U/L Final       ASSESSMENT: Diffuse loss B-cell lymphoma recurrent disease status post chemotherapy Recurrent pulmonary infection After starting prophylactic antibiotic patient did not have any more infection. Because of nausea associated with doxycycline patient was advised to take doxycycline orally once a day  Since last evaluation patient did not have any significant side effect from doxycycline.  No recurrent pulmonary infection. Patient will be followed on a regular basis and repeat PET scan or CT scan if needed  Patient expressed understanding and was in agreement with this plan. She also understands that She can call clinic at any time with any questions, concerns, or complaints.    No matching staging information was found for the patient.  Forest Gleason, MD   03/29/2015 12:16 PM

## 2015-03-31 ENCOUNTER — Encounter: Payer: Self-pay | Admitting: Internal Medicine

## 2015-04-01 ENCOUNTER — Encounter: Payer: Self-pay | Admitting: Oncology

## 2015-04-03 ENCOUNTER — Telehealth: Payer: Self-pay | Admitting: *Deleted

## 2015-04-03 DIAGNOSIS — C8590 Non-Hodgkin lymphoma, unspecified, unspecified site: Secondary | ICD-10-CM

## 2015-04-03 MED ORDER — DOXYCYCLINE HYCLATE 100 MG PO TABS: 100.0000 mg | ORAL_TABLET | Freq: Every day | ORAL | Status: DC

## 2015-04-03 MED ORDER — LEVOFLOXACIN 500 MG PO TABS: 500.0000 mg | ORAL_TABLET | Freq: Every day | ORAL | Status: DC

## 2015-04-03 NOTE — Addendum Note (Signed)
Addended by: Betti Cruz on: 04/03/2015 10:21 AM   Modules accepted: Orders

## 2015-04-03 NOTE — Telephone Encounter (Signed)
Escribed

## 2015-04-27 ENCOUNTER — Inpatient Hospital Stay: Payer: Medicare Other | Attending: Oncology

## 2015-04-27 DIAGNOSIS — Z7982 Long term (current) use of aspirin: Secondary | ICD-10-CM | POA: Insufficient documentation

## 2015-04-27 DIAGNOSIS — R5383 Other fatigue: Secondary | ICD-10-CM | POA: Insufficient documentation

## 2015-04-27 DIAGNOSIS — Z923 Personal history of irradiation: Secondary | ICD-10-CM | POA: Diagnosis not present

## 2015-04-27 DIAGNOSIS — C8510 Unspecified B-cell lymphoma, unspecified site: Secondary | ICD-10-CM | POA: Diagnosis present

## 2015-04-27 DIAGNOSIS — Z8701 Personal history of pneumonia (recurrent): Secondary | ICD-10-CM | POA: Diagnosis not present

## 2015-04-27 DIAGNOSIS — R531 Weakness: Secondary | ICD-10-CM | POA: Diagnosis not present

## 2015-04-27 DIAGNOSIS — Z9484 Stem cells transplant status: Secondary | ICD-10-CM | POA: Insufficient documentation

## 2015-04-27 DIAGNOSIS — D630 Anemia in neoplastic disease: Secondary | ICD-10-CM | POA: Diagnosis not present

## 2015-04-27 DIAGNOSIS — Z79899 Other long term (current) drug therapy: Secondary | ICD-10-CM | POA: Diagnosis not present

## 2015-04-27 DIAGNOSIS — J45909 Unspecified asthma, uncomplicated: Secondary | ICD-10-CM | POA: Insufficient documentation

## 2015-04-27 DIAGNOSIS — Z9221 Personal history of antineoplastic chemotherapy: Secondary | ICD-10-CM | POA: Insufficient documentation

## 2015-04-27 DIAGNOSIS — Z23 Encounter for immunization: Secondary | ICD-10-CM | POA: Diagnosis not present

## 2015-04-27 DIAGNOSIS — C801 Malignant (primary) neoplasm, unspecified: Secondary | ICD-10-CM

## 2015-04-27 MED ORDER — INFLUENZA VAC SPLIT QUAD 0.5 ML IM SUSY
0.5000 mL | PREFILLED_SYRINGE | Freq: Once | INTRAMUSCULAR | Status: AC
  Administered 2015-04-27: 0.5 mL via INTRAMUSCULAR
  Filled 2015-04-27: qty 0.5

## 2015-05-05 ENCOUNTER — Telehealth: Payer: Self-pay | Admitting: *Deleted

## 2015-05-05 MED ORDER — CITALOPRAM HYDROBROMIDE 20 MG PO TABS: 20.0000 mg | ORAL_TABLET | Freq: Every day | ORAL | Status: DC

## 2015-05-05 NOTE — Telephone Encounter (Signed)
Escribed

## 2015-05-17 ENCOUNTER — Inpatient Hospital Stay (HOSPITAL_BASED_OUTPATIENT_CLINIC_OR_DEPARTMENT_OTHER): Payer: Medicare Other | Admitting: Oncology

## 2015-05-17 ENCOUNTER — Inpatient Hospital Stay: Payer: Medicare Other

## 2015-05-17 VITALS — BP 117/72 | HR 78 | Temp 95.4°F | Wt 124.6 lb

## 2015-05-17 DIAGNOSIS — C8510 Unspecified B-cell lymphoma, unspecified site: Secondary | ICD-10-CM | POA: Diagnosis not present

## 2015-05-17 DIAGNOSIS — R531 Weakness: Secondary | ICD-10-CM

## 2015-05-17 DIAGNOSIS — R5383 Other fatigue: Secondary | ICD-10-CM

## 2015-05-17 DIAGNOSIS — Z8701 Personal history of pneumonia (recurrent): Secondary | ICD-10-CM

## 2015-05-17 DIAGNOSIS — C8590 Non-Hodgkin lymphoma, unspecified, unspecified site: Secondary | ICD-10-CM

## 2015-05-17 DIAGNOSIS — Z23 Encounter for immunization: Secondary | ICD-10-CM

## 2015-05-17 DIAGNOSIS — D630 Anemia in neoplastic disease: Secondary | ICD-10-CM

## 2015-05-17 DIAGNOSIS — Z7982 Long term (current) use of aspirin: Secondary | ICD-10-CM

## 2015-05-17 DIAGNOSIS — Z1231 Encounter for screening mammogram for malignant neoplasm of breast: Secondary | ICD-10-CM

## 2015-05-17 DIAGNOSIS — Z9484 Stem cells transplant status: Secondary | ICD-10-CM

## 2015-05-17 DIAGNOSIS — J45909 Unspecified asthma, uncomplicated: Secondary | ICD-10-CM

## 2015-05-17 DIAGNOSIS — Z9221 Personal history of antineoplastic chemotherapy: Secondary | ICD-10-CM

## 2015-05-17 DIAGNOSIS — Z923 Personal history of irradiation: Secondary | ICD-10-CM

## 2015-05-17 DIAGNOSIS — Z79899 Other long term (current) drug therapy: Secondary | ICD-10-CM

## 2015-05-17 LAB — CBC WITH DIFFERENTIAL/PLATELET
BASOS ABS: 0.1 10*3/uL (ref 0–0.1)
BASOS PCT: 1 %
Eosinophils Absolute: 0.3 10*3/uL (ref 0–0.7)
Eosinophils Relative: 5 %
HEMATOCRIT: 37.9 % (ref 35.0–47.0)
HEMOGLOBIN: 12.8 g/dL (ref 12.0–16.0)
Lymphocytes Relative: 32 %
Lymphs Abs: 1.6 10*3/uL (ref 1.0–3.6)
MCH: 34.1 pg — ABNORMAL HIGH (ref 26.0–34.0)
MCHC: 33.8 g/dL (ref 32.0–36.0)
MCV: 100.9 fL — ABNORMAL HIGH (ref 80.0–100.0)
MONO ABS: 0.5 10*3/uL (ref 0.2–0.9)
Monocytes Relative: 10 %
NEUTROS ABS: 2.6 10*3/uL (ref 1.4–6.5)
NEUTROS PCT: 52 %
Platelets: 163 10*3/uL (ref 150–440)
RBC: 3.75 MIL/uL — AB (ref 3.80–5.20)
RDW: 13.9 % (ref 11.5–14.5)
WBC: 5 10*3/uL (ref 3.6–11.0)

## 2015-05-17 LAB — COMPREHENSIVE METABOLIC PANEL
ALBUMIN: 3.8 g/dL (ref 3.5–5.0)
ALT: 12 U/L — ABNORMAL LOW (ref 14–54)
AST: 14 U/L — AB (ref 15–41)
Alkaline Phosphatase: 67 U/L (ref 38–126)
Anion gap: 4 — ABNORMAL LOW (ref 5–15)
BILIRUBIN TOTAL: 0.6 mg/dL (ref 0.3–1.2)
BUN: 30 mg/dL — AB (ref 6–20)
CHLORIDE: 105 mmol/L (ref 101–111)
CO2: 27 mmol/L (ref 22–32)
CREATININE: 1.17 mg/dL — AB (ref 0.44–1.00)
Calcium: 8.6 mg/dL — ABNORMAL LOW (ref 8.9–10.3)
GFR calc Af Amer: 53 mL/min — ABNORMAL LOW (ref >60)
GFR calc non Af Amer: 46 mL/min — ABNORMAL LOW (ref >60)
Glucose, Bld: 103 mg/dL — ABNORMAL HIGH (ref 65–99)
POTASSIUM: 3.8 mmol/L (ref 3.5–5.1)
Sodium: 136 mmol/L (ref 135–145)
Total Protein: 6.9 g/dL (ref 6.5–8.1)

## 2015-05-17 LAB — LACTATE DEHYDROGENASE: LDH: 132 U/L (ref 98–192)

## 2015-05-17 MED ORDER — LIDOCAINE-PRILOCAINE 2.5-2.5 % EX CREA: 1.0000 "application " | TOPICAL_CREAM | CUTANEOUS | Status: DC | PRN

## 2015-05-17 MED ORDER — SODIUM CHLORIDE 0.9 % IJ SOLN
10.0000 mL | INTRAMUSCULAR | Status: DC | PRN
  Administered 2015-05-17: 10 mL via INTRAVENOUS
  Filled 2015-05-17: qty 10

## 2015-05-17 MED ORDER — HEPARIN SOD (PORK) LOCK FLUSH 100 UNIT/ML IV SOLN
500.0000 [IU] | Freq: Once | INTRAVENOUS | Status: AC
  Administered 2015-05-17: 500 [IU] via INTRAVENOUS
  Filled 2015-05-17: qty 5

## 2015-05-20 ENCOUNTER — Encounter: Payer: Self-pay | Admitting: Oncology

## 2015-05-20 NOTE — Progress Notes (Signed)
[place] @ [facility] Telephone:(336) [phone]  Fax:(336) [place]: [birth year]  MR#: [id number]  JME#:[id number]  Patient Care Team: Madelyn Brunner, MD as PCP - General (Unknown Physician Specialty) Madelyn Brunner, MD as Referring Physician (Unknown Physician Specialty) Forest Gleason, MD (Unknown Physician Specialty) Christene Lye, MD (General Surgery)  CHIEF COMPLAINT:  Chief Complaint  Patient presents with  . OTHER   Oncology History   1.  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  Status post 6 cycles of chemotherapy with CHOP9nd involved field radiation treatment 18,00 rads to the mantle field  and mediasinal boost 2340. finished radiation and chemotherapy in November of 2000 residual mediastinotomy mass 2 cm 2.  In May of 2001 progressive disease with mediastinal mass becoming bigger 8 cm3.  The patient underwent 2 cycles of chemotherapy withRituxan, and ICE.(June of 2001) Followed by 64more cycles  at [facility] with DHAP.and high-dose chemotherapy with  BEAM finished treatment in October of 2001. patient had significant pancytopenia as well as fever lasting for 3 months after transplant. Recurrent sinus infection and pulmonary infection 3.  Patient  underwent EUS biopsy of which was negative .  (November, 2012) 4.  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  Came today for the follow-up.  No chills.  No fever.  Patient had a PET scan done patient also had a recurrent pulmonary infection with multiple admission in the hospital.  During last admission patient underwent bronchoscopy without any specific organisms identified. Gradually now recovering. Patient is here for ongoing evaluation.  Since last evaluation patient continues to take doxycycline for 2 weeks and one week of Levaquin and one week off has not developed any pulmonary infection.  Has traveled back to [place].  Spent time with the family.  No chills.  No fever.  On oxygen.[name]  REVIEW OF SYSTEMS:   No status: Patient is feeling weak and tired.  No chills.  No fever. Lungs: On oxygen.  Shortness of breath on exertion.  No cough or expectoration  Cardiac: No chest pain. Gastro intestinal system: Patient complains of mild nausea with doxycycline No heartburn.  No nausea or vomiting.  No abdominal pain.  No diarrhea.  No constipation.  No rectal bleeding. Neurological system: No dizziness.  No tingling.  His was.  no tingling numbness.  no focal weakness or any focal signs. Skin: No evidence of ecchymosis or rash. Lower extremity no edema Skin no skeletal system no bony pain. Is and did not have  any pulmonary infection in last 4 weeks.  Had dizziness with Levaquin but started taking in the night and did not have any problem. As per HPI. Otherwise, a complete review of systems is negatve.  PAST MEDICAL HISTORY: Past Medical History  Diagnosis Date  . Non Hodgkin's lymphoma (HCC)   . Asthma   . Acute respiratory failure (HCC) 11/29/2014  . Multiple myeloma (HCC) 12/01/2014  . 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 Nasal sinus surgery  08/07/05 09/09/05    x 2   . Video bronchoscopy Bilateral 11/30/2014    Procedure: VIDEO BRONCHOSCOPY WITHOUT FLUORO;  Surgeon: Erin Fulling, MD;  Location: [facility] ORS;  Service: Cardiopulmonary;  Laterality: Bilateral;    FAMILY HISTORY Family History  Problem Relation Age of Onset  . Asthma Son     ADVANCED DIRECTIVES:  Patient does have advanced health care directive HEALTH MAINTENANCE: Social History  Substance Use Topics  . Smoking status: Never Smoker   . Smokeless tobacco: Never Used  . Alcohol Use: Yes      Allergies  Allergen Reactions  . Hydrocodone-Homatropine Itching  . Tussionex Pennkinetic Er [Hydrocod Polst-Cpm Polst Er] Itching  . Ciprofloxacin Rash    Current Outpatient Prescriptions  Medication Sig Dispense Refill  . albuterol (PROVENTIL HFA;VENTOLIN HFA) 108 (90 BASE) MCG/ACT inhaler Inhale 1 puff into the lungs every 6 (six) hours as needed for wheezing or shortness of breath.    [name] aspirin EC 81 MG tablet Take 81 mg by mouth daily.    . cetirizine (ZYRTEC) 10 MG tablet Take 10 mg by mouth daily as needed for allergies.     . citalopram (CELEXA) 20 MG tablet Take 1 tablet (20 mg total) by mouth daily. 30 tablet 3  . doxycycline (VIBRA-TABS) 100 MG tablet Take 1 tablet (100 mg total) by mouth at bedtime. For 15 days. 30 tablet 3  . Fluticasone-Salmeterol (ADVAIR DISKUS) 250-50 MCG/DOSE AEPB Inhale 1 puff into the lungs 2 (two) times daily.     [name] guaiFENesin (MUCINEX) 600 MG 12 hr tablet Take 600 mg by mouth 2 (two) times daily as needed for to loosen phlegm.    [name] levofloxacin (LEVAQUIN) 500 MG tablet Take 1 tablet (500 mg total) by mouth daily. Start taking after doxycycline for 7 days then take 1 week off. 7 tablet 3  . levothyroxine (SYNTHROID, LEVOTHROID) 25 MCG tablet Take 25 mcg by mouth daily.      . ondansetron (ZOFRAN) 4 MG tablet Take 1 tablet (4 mg total) by mouth  every 8 (eight) hours as needed for nausea or vomiting. 30 tablet 1  . pantoprazole (PROTONIX) 40 MG tablet Take 1 tablet (40 mg total) by mouth at bedtime. 1 tablet 6  . potassium chloride SA (K-DUR,KLOR-CON) 20 MEQ tablet Take 20 mEq by mouth 2 (two) times daily.    . predniSONE (DELTASONE) 10 MG tablet 5 tabs day 1, 4 tabs day 2, 3 tabs day 3, 2 tabs day 4 1 tab day 5 then discontinue 15 tablet 0  . senna-docusate (SENOKOT-S) 8.6-50 MG per tablet Take 1 tablet by mouth at bedtime as needed for mild constipation. 60 tablet 6  . traZODone (DESYREL) 100 MG tablet Take 1 tablet (100 mg total) by mouth at bedtime. 30 tablet 3  . lidocaine-prilocaine (EMLA) cream Apply 1 application topically as needed. 30 g 3  No current facility-administered medications for this visit.    OBJECTIVE:  Filed Vitals:   05/17/15 1158  BP: 117/72  Pulse: 78  Temp: 95.4 F (35.2 C)     Body mass index is 22.07 kg/(m^2).    ECOG FS:2 - Symptomatic, <50% confined to bed  PHYSICAL EXAM: Gen. Status: Patient is alert and oriented on oxygen. In  Wheelchair Head exam was generally normal. There was no scleral icterus or corneal arcus. Mucous membranes were moist. Abdomen: Soft liver and spleen not palpable no ascites Lungs: Emphysematous chest.  Bilateral rhonchi.  Right more than left Diminished and entry. Examination of the skin revealed no evidence of significant rashes, suspicious appearing nevi or other concerning lesions. Neurologically, the patient was awake, alert, and oriented to person, place and time. There were no obvious focal neurologic abnormalities. Cardiac exam revealed the PMI to be normally situated and sized. The rhythm was regular and no extrasystoles were noted during several minutes of auscultation. The first and second heart sounds were normal and physiologic splitting of the second heart sound was noted. There were no murmurs, rubs, clicks, or gallops. Lymphatic system: No palpable  supraclavicular or cervical adenopathy   LAB RESULTS:  Infusion on 05/17/2015  Component Date Value Ref Range Status  . WBC 05/17/2015 5.0  3.6 - 11.0 K/uL Final  . RBC 05/17/2015 3.75* 3.80 - 5.20 MIL/uL Final  . Hemoglobin 05/17/2015 12.8  12.0 - 16.0 g/dL Final  . HCT 05/17/2015 37.9  35.0 - 47.0 % Final  . MCV 05/17/2015 100.9* 80.0 - 100.0 fL Final  . MCH 05/17/2015 34.1* 26.0 - 34.0 pg Final  . MCHC 05/17/2015 33.8  32.0 - 36.0 g/dL Final  . RDW 05/17/2015 13.9  11.5 - 14.5 % Final  . Platelets 05/17/2015 163  150 - 440 K/uL Final  . Neutrophils Relative % 05/17/2015 52   Final  . Neutro Abs 05/17/2015 2.6  1.4 - 6.5 K/uL Final  . Lymphocytes Relative 05/17/2015 32   Final  . Lymphs Abs 05/17/2015 1.6  1.0 - 3.6 K/uL Final  . Monocytes Relative 05/17/2015 10   Final  . Monocytes Absolute 05/17/2015 0.5  0.2 - 0.9 K/uL Final  . Eosinophils Relative 05/17/2015 5   Final  . Eosinophils Absolute 05/17/2015 0.3  0 - 0.7 K/uL Final  . Basophils Relative 05/17/2015 1   Final  . Basophils Absolute 05/17/2015 0.1  0 - 0.1 K/uL Final  . Sodium 05/17/2015 136  135 - 145 mmol/L Final  . Potassium 05/17/2015 3.8  3.5 - 5.1 mmol/L Final  . Chloride 05/17/2015 105  101 - 111 mmol/L Final  . CO2 05/17/2015 27  22 - 32 mmol/L Final  . Glucose, Bld 05/17/2015 103* 65 - 99 mg/dL Final  . BUN 05/17/2015 30* 6 - 20 mg/dL Final  . Creatinine, Ser 05/17/2015 1.17* 0.44 - 1.00 mg/dL Final  . Calcium 05/17/2015 8.6* 8.9 - 10.3 mg/dL Final  . Total Protein 05/17/2015 6.9  6.5 - 8.1 g/dL Final  . Albumin 05/17/2015 3.8  3.5 - 5.0 g/dL Final  . AST 05/17/2015 14* 15 - 41 U/L Final  . ALT 05/17/2015 12* 14 - 54 U/L Final  . Alkaline Phosphatase 05/17/2015 67  38 - 126 U/L Final  . Total Bilirubin 05/17/2015 0.6  0.3 - 1.2 mg/dL Final  . GFR calc non Af Amer 05/17/2015 46* >60 mL/min Final  . GFR calc Af Amer 05/17/2015 53* >60 mL/min Final   Comment: (NOTE)  The eGFR has been calculated using the  CKD EPI equation. This calculation has not been validated in all clinical situations. eGFR's persistently <60 mL/min signify possible Chronic Kidney Disease.   . Anion gap 05/17/2015 4* 5 - 15 Final  . LDH 05/17/2015 132  98 - 192 U/L Final       ASSESSMENT: Diffuse loss B-cell lymphoma recurrent disease status post chemotherapy Recurrent pulmonary infection After starting prophylactic antibiotic patient did not have any more infection. Because of nausea associated with doxycycline patient was advised to take doxycycline orally once a day Continuing intermittent oral antibiotics Patient was advised to call me if spikes high fever Advised to protect herself during the winter from cold weather Repeat CT scan and PET scan in February of 2016  Since last evaluation patient did not have any significant side effect from doxycycline.  No recurrent pulmonary infection. Patient will be followed on a regular basis and repeat PET scan or CT scan if needed  Patient expressed understanding and was in agreement with this plan. She also understands that She can call clinic at any time with any questions, concerns, or complaints.    No matching staging information was found for the patient.  Forest Gleason, MD   05/20/2015 8:48 AM

## 2015-05-29 ENCOUNTER — Ambulatory Visit
Admission: RE | Admit: 2015-05-29 | Discharge: 2015-05-29 | Disposition: A | Discharge status: Home / Self Care | Payer: Medicare Other | Source: Ambulatory Visit | Attending: Oncology | Admitting: Oncology

## 2015-05-29 DIAGNOSIS — Z1231 Encounter for screening mammogram for malignant neoplasm of breast: Secondary | ICD-10-CM | POA: Insufficient documentation

## 2015-05-29 DIAGNOSIS — C8590 Non-Hodgkin lymphoma, unspecified, unspecified site: Secondary | ICD-10-CM

## 2015-06-13 ENCOUNTER — Telehealth: Payer: Self-pay | Admitting: *Deleted

## 2015-06-13 MED ORDER — FLUCONAZOLE 100 MG PO TABS
100.0000 mg | ORAL_TABLET | Freq: Every day | ORAL | Status: DC
Start: 2015-06-13 — End: 2015-07-04

## 2015-06-13 NOTE — Telephone Encounter (Signed)
Has thrush for several days now and thinks it is from the abx she is on. Per L Herring, AGNP-C fluconazole 100 mg daily for 3 days sent to pharmacy. Patient informed

## 2015-06-22 ENCOUNTER — Telehealth: Payer: Self-pay | Admitting: *Deleted

## 2015-06-22 MED ORDER — DOXYCYCLINE HYCLATE 100 MG PO TABS: 100.0000 mg | ORAL_TABLET | Freq: Two times a day (BID) | ORAL | Status: DC

## 2015-06-22 NOTE — Telephone Encounter (Signed)
Escribed

## 2015-06-28 ENCOUNTER — Inpatient Hospital Stay: Payer: Medicare Other | Attending: Oncology

## 2015-06-28 DIAGNOSIS — R531 Weakness: Secondary | ICD-10-CM | POA: Insufficient documentation

## 2015-06-28 DIAGNOSIS — Z949 Transplanted organ and tissue status, unspecified: Secondary | ICD-10-CM | POA: Diagnosis not present

## 2015-06-28 DIAGNOSIS — R05 Cough: Secondary | ICD-10-CM | POA: Insufficient documentation

## 2015-06-28 DIAGNOSIS — R0602 Shortness of breath: Secondary | ICD-10-CM | POA: Insufficient documentation

## 2015-06-28 DIAGNOSIS — Z9221 Personal history of antineoplastic chemotherapy: Secondary | ICD-10-CM | POA: Insufficient documentation

## 2015-06-28 DIAGNOSIS — Z7982 Long term (current) use of aspirin: Secondary | ICD-10-CM | POA: Diagnosis not present

## 2015-06-28 DIAGNOSIS — Z792 Long term (current) use of antibiotics: Secondary | ICD-10-CM | POA: Diagnosis not present

## 2015-06-28 DIAGNOSIS — R509 Fever, unspecified: Secondary | ICD-10-CM | POA: Diagnosis not present

## 2015-06-28 DIAGNOSIS — R5383 Other fatigue: Secondary | ICD-10-CM | POA: Insufficient documentation

## 2015-06-28 DIAGNOSIS — Z862 Personal history of diseases of the blood and blood-forming organs and certain disorders involving the immune mechanism: Secondary | ICD-10-CM | POA: Diagnosis not present

## 2015-06-28 DIAGNOSIS — E876 Hypokalemia: Secondary | ICD-10-CM | POA: Insufficient documentation

## 2015-06-28 DIAGNOSIS — Z923 Personal history of irradiation: Secondary | ICD-10-CM | POA: Insufficient documentation

## 2015-06-28 DIAGNOSIS — J45909 Unspecified asthma, uncomplicated: Secondary | ICD-10-CM | POA: Insufficient documentation

## 2015-06-28 DIAGNOSIS — Z452 Encounter for adjustment and management of vascular access device: Secondary | ICD-10-CM | POA: Insufficient documentation

## 2015-06-28 DIAGNOSIS — Z8709 Personal history of other diseases of the respiratory system: Secondary | ICD-10-CM | POA: Diagnosis not present

## 2015-06-28 DIAGNOSIS — C801 Malignant (primary) neoplasm, unspecified: Secondary | ICD-10-CM

## 2015-06-28 DIAGNOSIS — Z79899 Other long term (current) drug therapy: Secondary | ICD-10-CM | POA: Diagnosis not present

## 2015-06-28 DIAGNOSIS — J9801 Acute bronchospasm: Secondary | ICD-10-CM | POA: Diagnosis not present

## 2015-06-28 DIAGNOSIS — C8330 Diffuse large B-cell lymphoma, unspecified site: Secondary | ICD-10-CM | POA: Diagnosis present

## 2015-06-28 MED ORDER — HEPARIN SOD (PORK) LOCK FLUSH 100 UNIT/ML IV SOLN
500.0000 [IU] | Freq: Once | INTRAVENOUS | Status: AC
Start: 2015-06-28 — End: 2015-06-28
  Administered 2015-06-28: 500 [IU] via INTRAVENOUS
  Filled 2015-06-28: qty 5

## 2015-06-28 MED ORDER — SODIUM CHLORIDE 0.9 % IJ SOLN
10.0000 mL | Freq: Once | INTRAMUSCULAR | Status: AC
  Administered 2015-06-28: 10 mL via INTRAVENOUS
  Filled 2015-06-28: qty 10

## 2015-07-04 ENCOUNTER — Inpatient Hospital Stay: Payer: Medicare Other

## 2015-07-04 ENCOUNTER — Inpatient Hospital Stay (HOSPITAL_BASED_OUTPATIENT_CLINIC_OR_DEPARTMENT_OTHER): Payer: Medicare Other | Admitting: Oncology

## 2015-07-04 ENCOUNTER — Ambulatory Visit
Admission: RE | Admit: 2015-07-04 | Discharge: 2015-07-04 | Disposition: A | Discharge status: Home / Self Care | Payer: Medicare Other | Source: Ambulatory Visit | Attending: Oncology | Admitting: Oncology

## 2015-07-04 ENCOUNTER — Encounter: Payer: Self-pay | Admitting: Oncology

## 2015-07-04 ENCOUNTER — Telehealth: Payer: Self-pay | Admitting: *Deleted

## 2015-07-04 VITALS — BP 118/75 | HR 18 | Temp 95.8°F | Resp 18 | Wt 120.8 lb

## 2015-07-04 DIAGNOSIS — R0602 Shortness of breath: Secondary | ICD-10-CM

## 2015-07-04 DIAGNOSIS — J45909 Unspecified asthma, uncomplicated: Secondary | ICD-10-CM

## 2015-07-04 DIAGNOSIS — R05 Cough: Secondary | ICD-10-CM | POA: Diagnosis not present

## 2015-07-04 DIAGNOSIS — C8580 Other specified types of non-Hodgkin lymphoma, unspecified site: Secondary | ICD-10-CM

## 2015-07-04 DIAGNOSIS — Z9221 Personal history of antineoplastic chemotherapy: Secondary | ICD-10-CM

## 2015-07-04 DIAGNOSIS — Z923 Personal history of irradiation: Secondary | ICD-10-CM

## 2015-07-04 DIAGNOSIS — C8590 Non-Hodgkin lymphoma, unspecified, unspecified site: Secondary | ICD-10-CM

## 2015-07-04 DIAGNOSIS — R5383 Other fatigue: Secondary | ICD-10-CM

## 2015-07-04 DIAGNOSIS — Z949 Transplanted organ and tissue status, unspecified: Secondary | ICD-10-CM

## 2015-07-04 DIAGNOSIS — Z792 Long term (current) use of antibiotics: Secondary | ICD-10-CM

## 2015-07-04 DIAGNOSIS — Z8572 Personal history of non-Hodgkin lymphomas: Secondary | ICD-10-CM | POA: Insufficient documentation

## 2015-07-04 DIAGNOSIS — Z7982 Long term (current) use of aspirin: Secondary | ICD-10-CM

## 2015-07-04 DIAGNOSIS — C8330 Diffuse large B-cell lymphoma, unspecified site: Secondary | ICD-10-CM | POA: Diagnosis not present

## 2015-07-04 DIAGNOSIS — R059 Cough, unspecified: Secondary | ICD-10-CM

## 2015-07-04 DIAGNOSIS — R531 Weakness: Secondary | ICD-10-CM

## 2015-07-04 DIAGNOSIS — Z79899 Other long term (current) drug therapy: Secondary | ICD-10-CM

## 2015-07-04 DIAGNOSIS — J9801 Acute bronchospasm: Secondary | ICD-10-CM

## 2015-07-04 DIAGNOSIS — R509 Fever, unspecified: Secondary | ICD-10-CM | POA: Diagnosis not present

## 2015-07-04 DIAGNOSIS — E876 Hypokalemia: Secondary | ICD-10-CM

## 2015-07-04 DIAGNOSIS — Z452 Encounter for adjustment and management of vascular access device: Secondary | ICD-10-CM

## 2015-07-04 DIAGNOSIS — Z8709 Personal history of other diseases of the respiratory system: Secondary | ICD-10-CM

## 2015-07-04 DIAGNOSIS — Z862 Personal history of diseases of the blood and blood-forming organs and certain disorders involving the immune mechanism: Secondary | ICD-10-CM

## 2015-07-04 LAB — CBC WITH DIFFERENTIAL/PLATELET
BASOS ABS: 0 10*3/uL (ref 0–0.1)
BASOS PCT: 1 %
EOS ABS: 0.4 10*3/uL (ref 0–0.7)
EOS PCT: 10 %
HCT: 43.7 % (ref 35.0–47.0)
Hemoglobin: 14.8 g/dL (ref 12.0–16.0)
LYMPHS PCT: 28 %
Lymphs Abs: 1.2 10*3/uL (ref 1.0–3.6)
MCH: 33.9 pg (ref 26.0–34.0)
MCHC: 33.8 g/dL (ref 32.0–36.0)
MCV: 100.5 fL — AB (ref 80.0–100.0)
MONO ABS: 0.8 10*3/uL (ref 0.2–0.9)
Monocytes Relative: 17 %
Neutro Abs: 2 10*3/uL (ref 1.4–6.5)
Neutrophils Relative %: 44 %
PLATELETS: 152 10*3/uL (ref 150–440)
RBC: 4.35 MIL/uL (ref 3.80–5.20)
RDW: 14 % (ref 11.5–14.5)
WBC: 4.5 10*3/uL (ref 3.6–11.0)

## 2015-07-04 LAB — BASIC METABOLIC PANEL
ANION GAP: 8 (ref 5–15)
BUN: 27 mg/dL — ABNORMAL HIGH (ref 6–20)
CALCIUM: 9.9 mg/dL (ref 8.9–10.3)
CO2: 26 mmol/L (ref 22–32)
Chloride: 102 mmol/L (ref 101–111)
Creatinine, Ser: 1.27 mg/dL — ABNORMAL HIGH (ref 0.44–1.00)
GFR, EST AFRICAN AMERICAN: 48 mL/min — AB (ref >60)
GFR, EST NON AFRICAN AMERICAN: 41 mL/min — AB (ref >60)
GLUCOSE: 104 mg/dL — AB (ref 65–99)
POTASSIUM: 3.4 mmol/L — AB (ref 3.5–5.1)
SODIUM: 136 mmol/L (ref 135–145)

## 2015-07-04 MED ORDER — OMEPRAZOLE 20 MG PO CPDR: 20.0000 mg | DELAYED_RELEASE_CAPSULE | Freq: Every day | ORAL | Status: DC

## 2015-07-04 MED ORDER — AMOXICILLIN-POT CLAVULANATE 875-125 MG PO TABS: 1.0000 | ORAL_TABLET | Freq: Two times a day (BID) | ORAL | Status: DC

## 2015-07-04 MED ORDER — PREDNISONE 10 MG PO TABS: ORAL_TABLET | ORAL | Status: DC

## 2015-07-04 NOTE — Telephone Encounter (Signed)
Per Dr Oliva Bustard, come in for CXR, lab and see md this afternoon, Pt in agreement with this and will go to hospital for CXR at 1 then come the cancer center after that

## 2015-07-04 NOTE — Progress Notes (Signed)
Patient here today as acute add on for cold symptoms.  States she has had fever for past 2 days.  No fever today.

## 2015-07-04 NOTE — Telephone Encounter (Signed)
Called to report she has had a cold for a week and is running low grade fever. Asking if Dr Oliva Bustard wants to check her chest

## 2015-07-04 NOTE — Progress Notes (Signed)
[place] @ [facility] [phone]  Fax:(336) [place]: 16-Apr-1946  MR#: [id number]  HYI#:[id number]  Patient Care Team: Madelyn Brunner, MD as PCP - General (Unknown Physician Specialty) Madelyn Brunner, MD as Referring Physician (Unknown Physician Specialty) Forest Gleason, MD (Unknown Physician Specialty) Christene Lye, MD (General Surgery)  CHIEF COMPLAINT:  Chief Complaint  Patient presents with  . Acute Visit   Oncology History   1.  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  Status post 6 cycles of chemotherapy with CHOP9nd involved field radiation treatment 18,00 rads to the mantle field  and mediasinal boost 2340. finished radiation and chemotherapy in November of 2000 residual mediastinotomy mass 2 cm 2.  In May of 2001 progressive disease with mediastinal mass becoming bigger 8 cm3.  The patient underwent 2 cycles of chemotherapy withRituxan, and ICE.(June of 2001) Followed by 31more cycles  at [facility] with DHAP.and high-dose chemotherapy with  BEAM finished treatment in October of 2001. patient had significant pancytopenia as well as fever lasting for 3 months after transplant. Recurrent sinus infection and pulmonary infection 3.  Patient  underwent EUS biopsy of which was negative .  (November, 2012) 4.  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  Came today for the follow-up.  No chills.  No fever.  Patient had a PET scan done patient also had a recurrent pulmonary infection with multiple admission in the hospital.  During last admission patient  Patient came as an acute add-on.  Started complaining low-grade fever and increasing cough yellowish expectoration.  Patient is on intermittent antibiotic therapy presently on doxycycline. Here for further follow-up and a chest x-ray done in the lab data was evaluated.  No chest pain.  No hemoptysis. REVIEW OF SYSTEMS:   No status: Patient is feeling weak and tired.  No chills.  No fever. Lungs: On oxygen.  Shortness of breath on exertion.  Low-grade fever.  Increasing cough and yellowish expectoration.  No hemoptysis.  Cardiac: No chest pain. Gastro intestinal system: Patient complains of mild nausea with doxycycline No heartburn.  No nausea or vomiting.  No abdominal pain.  No diarrhea.  No constipation.  No rectal bleeding. Neurological system: No dizziness.  No tingling.  His was.  no tingling numbness.  no focal weakness or any focal signs. Skin: No evidence of ecchymosis or rash. Lower extremity no edema Skin no skeletal system no bony pain. Is and did not have any pulmonary infection in last 4 weeks.  Had dizziness with Levaquin but started taking in the night and did not have any problem. As per HPI. Otherwise, a complete review of systems is negatve.  PAST MEDICAL HISTORY: Past Medical History  Diagnosis Date  . Non Hodgkin's lymphoma ([place])   . Asthma   . Acute respiratory failure ([place]) 11/29/2014  . Multiple myeloma ([place]) 12/01/2014  . 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 Nasal sinus surgery  08/07/05 09/09/05    x 2   . Video bronchoscopy Bilateral 11/30/2014    Procedure: VIDEO BRONCHOSCOPY WITHOUT FLUORO;  Surgeon: Flora Lipps, MD;  Location: [facility] ORS;  Service: Cardiopulmonary;  Laterality: Bilateral;    FAMILY HISTORY Family History  Problem Relation Age of Onset  . Asthma Son     ADVANCED DIRECTIVES:  Patient does have advanced health care directive HEALTH MAINTENANCE: Social History  Substance Use Topics  . Smoking status: Never Smoker   . Smokeless tobacco: Never Used  . Alcohol Use: Yes      Allergies  Allergen Reactions  . Hydrocodone-Homatropine Itching  . Tussionex Pennkinetic Er [Hydrocod Polst-Cpm Polst Er] Itching  . Ciprofloxacin Rash    Current Outpatient Prescriptions  Medication Sig Dispense Refill  . albuterol (PROVENTIL HFA;VENTOLIN HFA) 108 (90 BASE) MCG/ACT inhaler Inhale 1 puff into the lungs every 6 (six) hours as needed for wheezing or shortness of breath.    [name] aspirin EC 81 MG tablet Take 81 mg by mouth daily.    . cetirizine (ZYRTEC) 10 MG tablet Take 10 mg by mouth daily as needed for allergies.     . citalopram (CELEXA) 20 MG tablet Take 1 tablet (20 mg total) by mouth daily. 30 tablet 3  . doxycycline (VIBRA-TABS) 100 MG tablet Take 1 tablet (100 mg total) by mouth 2 (two) times daily. For 15 days. 30 tablet 3  . Fluticasone-Salmeterol (ADVAIR DISKUS) 250-50 MCG/DOSE AEPB Inhale 1 puff into the lungs 2 (two) times daily.     [name] guaiFENesin (MUCINEX) 600 MG 12 hr tablet Take 600 mg by mouth 2 (two) times daily as needed for to loosen phlegm.    [name] levothyroxine (SYNTHROID, LEVOTHROID) 25 MCG tablet Take 25 mcg by mouth daily.      [name] lidocaine-prilocaine (EMLA) cream Apply 1 application topically as needed. 30 g 3  . ondansetron (ZOFRAN) 4 MG tablet Take 1 tablet (4 mg total) by mouth every 8 (eight) hours as needed for nausea or vomiting. 30 tablet 1  . pantoprazole (PROTONIX)  40 MG tablet Take 1 tablet (40 mg total) by mouth at bedtime. 1 tablet 6  . potassium chloride SA (K-DUR,KLOR-CON) 20 MEQ tablet Take 20 mEq by mouth 2 (two) times daily.    [name] senna-docusate (SENOKOT-S) 8.6-50 MG per tablet Take 1 tablet by mouth at bedtime as needed for mild constipation. 60 tablet 6  . traZODone (DESYREL) 100 MG tablet Take 1 tablet (100 mg total) by mouth at bedtime. 30 tablet 3  . amoxicillin-clavulanate (AUGMENTIN) 875-125 MG tablet Take 1 tablet by mouth 2 (two) times daily. 14 tablet 0  . omeprazole (PRILOSEC) 20 MG capsule Take 1 capsule (20 mg total) by mouth daily. 15 capsule 0  . predniSONE (DELTASONE) 10 MG tablet Start $RemoveBefo'60mg'ObvWTZchBVT$  taper by $Remove'10mg'HuBDoht$  daily to 0 21 tablet 0   No current facility-administered medications for this visit.    OBJECTIVE:  Filed  Vitals:   07/04/15 1404  BP: 118/75  Pulse: 18  Temp: 95.8 F (35.4 C)  Resp: 18     Body mass index is 21.41 kg/(m^2).    ECOG FS:2 - Symptomatic, <50% confined to bed  PHYSICAL EXAM: Gen. Status: Patient is alert and oriented on oxygen. In  Wheelchair Head exam was generally normal. There was no scleral icterus or corneal arcus. Mucous membranes were moist. Abdomen: Soft liver and spleen not palpable no ascites Lungs: Emphysematous chest.  Bilateral rhonchi.  Right more than left Diminished and entry. Examination of the skin revealed no evidence of significant rashes, suspicious appearing nevi or other concerning lesions. Neurologically, the patient was awake, alert, and oriented to person, place and time. There were no obvious focal neurologic abnormalities. Cardiac exam revealed the PMI to be normally situated and sized. The rhythm was regular and no extrasystoles were noted during several minutes of auscultation. The first and second heart sounds were normal and physiologic splitting of the second heart sound was noted. There were no murmurs, rubs, clicks, or gallops. Lymphatic system: No palpable supraclavicular  or cervical adenopathy   LAB RESULTS:  Appointment on 07/04/2015  Component Date Value Ref Range Status  . WBC 07/04/2015 4.5  3.6 - 11.0 K/uL Final  . RBC 07/04/2015 4.35  3.80 - 5.20 MIL/uL Final  . Hemoglobin 07/04/2015 14.8  12.0 - 16.0 g/dL Final  . HCT 07/04/2015 43.7  35.0 - 47.0 % Final  . MCV 07/04/2015 100.5* 80.0 - 100.0 fL Final  . MCH 07/04/2015 33.9  26.0 - 34.0 pg Final  . MCHC 07/04/2015 33.8  32.0 - 36.0 g/dL Final  . RDW 07/04/2015 14.0  11.5 - 14.5 % Final  . Platelets 07/04/2015 152  150 - 440 K/uL Final  . Neutrophils Relative % 07/04/2015 44   Final  . Neutro Abs 07/04/2015 2.0  1.4 - 6.5 K/uL Final  . Lymphocytes Relative 07/04/2015 28   Final  . Lymphs Abs 07/04/2015 1.2  1.0 - 3.6 K/uL Final  . Monocytes Relative 07/04/2015 17   Final  . Monocytes Absolute 07/04/2015 0.8  0.2 - 0.9 K/uL Final  . Eosinophils Relative 07/04/2015 10   Final  . Eosinophils Absolute 07/04/2015 0.4  0 - 0.7 K/uL Final  . Basophils Relative 07/04/2015 1   Final  . Basophils Absolute 07/04/2015 0.0  0 - 0.1 K/uL Final  . Sodium 07/04/2015 136  135 - 145 mmol/L Final  . Potassium 07/04/2015 3.4* 3.5 - 5.1 mmol/L Final  . Chloride 07/04/2015 102  101 - 111 mmol/L Final  . CO2 07/04/2015 26  22 - 32 mmol/L Final  . Glucose, Bld 07/04/2015 104* 65 - 99 mg/dL Final  . BUN 07/04/2015 27* 6 - 20 mg/dL Final  . Creatinine, Ser 07/04/2015 1.27* 0.44 - 1.00 mg/dL Final  . Calcium 07/04/2015 9.9  8.9 - 10.3 mg/dL Final  . GFR calc non Af Amer 07/04/2015 41* >60 mL/min Final  . GFR calc Af Amer 07/04/2015 48* >60 mL/min Final   Comment: (NOTE) The eGFR has been calculated using the CKD EPI equation. This calculation has not been validated in all clinical situations. eGFR's persistently <60 mL/min signify possible Chronic Kidney Disease.   . Anion gap 07/04/2015 8  5 - 15 Final       ASSESSMENT: Diffuse loss B-cell lymphoma recurrent disease status post chemotherapy Recurrent  pulmonary infection A cute onset of cough and shortness of breath with low-grade fever Chest x-ray  done today is been reviewed independently sows bilateral fibrosis no evidence of acute pneumonia Clinical picture is suggestive of pulmonary infection most likely bronchitis Patient would be started on prednisone because patient has bronchospasm as well as Augmentin.  The patient does not improve then further IV antibiotics will be considered a CAT scan would be considered to rule out any recurrent lymphoma CBC was reviewed there was no evidence of leukocytosis All other lab data was reviewed.  Mild hypokalemia.  Patient expressed understanding and was in agreement with this plan. She also understands that She can call clinic at any time with any questions, concerns, or complaints.    No matching staging information was found for the patient.  Forest Gleason, MD   07/04/2015 3:42 PM

## 2015-08-15 ENCOUNTER — Telehealth: Payer: Self-pay | Admitting: *Deleted

## 2015-08-15 NOTE — Telephone Encounter (Signed)
Mr. Asplin called to ask if MD will assess patient's lungs at tomorrow's visit.  States respirations sound wet and he can hear her rattling.  Thinks she has some fluid accumulation.

## 2015-08-15 NOTE — Telephone Encounter (Signed)
MD will listen to pt's lung at visit tomorrow

## 2015-08-16 ENCOUNTER — Inpatient Hospital Stay (HOSPITAL_BASED_OUTPATIENT_CLINIC_OR_DEPARTMENT_OTHER): Payer: Medicare Other | Admitting: Oncology

## 2015-08-16 ENCOUNTER — Inpatient Hospital Stay: Payer: Medicare Other

## 2015-08-16 ENCOUNTER — Inpatient Hospital Stay: Payer: Medicare Other | Attending: Oncology

## 2015-08-16 VITALS — BP 119/81 | HR 98 | Temp 96.6°F | Resp 18 | Wt 123.5 lb

## 2015-08-16 DIAGNOSIS — R5383 Other fatigue: Secondary | ICD-10-CM | POA: Insufficient documentation

## 2015-08-16 DIAGNOSIS — J45909 Unspecified asthma, uncomplicated: Secondary | ICD-10-CM

## 2015-08-16 DIAGNOSIS — Z9481 Bone marrow transplant status: Secondary | ICD-10-CM | POA: Diagnosis not present

## 2015-08-16 DIAGNOSIS — R531 Weakness: Secondary | ICD-10-CM | POA: Diagnosis not present

## 2015-08-16 DIAGNOSIS — Z8572 Personal history of non-Hodgkin lymphomas: Secondary | ICD-10-CM

## 2015-08-16 DIAGNOSIS — Z862 Personal history of diseases of the blood and blood-forming organs and certain disorders involving the immune mechanism: Secondary | ICD-10-CM | POA: Insufficient documentation

## 2015-08-16 DIAGNOSIS — R05 Cough: Secondary | ICD-10-CM

## 2015-08-16 DIAGNOSIS — R059 Cough, unspecified: Secondary | ICD-10-CM

## 2015-08-16 DIAGNOSIS — R918 Other nonspecific abnormal finding of lung field: Secondary | ICD-10-CM

## 2015-08-16 DIAGNOSIS — R509 Fever, unspecified: Secondary | ICD-10-CM | POA: Diagnosis not present

## 2015-08-16 DIAGNOSIS — Z79899 Other long term (current) drug therapy: Secondary | ICD-10-CM

## 2015-08-16 DIAGNOSIS — Z923 Personal history of irradiation: Secondary | ICD-10-CM | POA: Insufficient documentation

## 2015-08-16 DIAGNOSIS — Z452 Encounter for adjustment and management of vascular access device: Secondary | ICD-10-CM

## 2015-08-16 DIAGNOSIS — J188 Other pneumonia, unspecified organism: Secondary | ICD-10-CM

## 2015-08-16 DIAGNOSIS — Z9221 Personal history of antineoplastic chemotherapy: Secondary | ICD-10-CM | POA: Insufficient documentation

## 2015-08-16 DIAGNOSIS — Z7982 Long term (current) use of aspirin: Secondary | ICD-10-CM

## 2015-08-16 DIAGNOSIS — C8590 Non-Hodgkin lymphoma, unspecified, unspecified site: Secondary | ICD-10-CM

## 2015-08-16 DIAGNOSIS — R0600 Dyspnea, unspecified: Secondary | ICD-10-CM

## 2015-08-16 LAB — CBC WITH DIFFERENTIAL/PLATELET
BASOS PCT: 1 %
Basophils Absolute: 0.1 10*3/uL (ref 0–0.1)
EOS ABS: 0.7 10*3/uL (ref 0–0.7)
Eosinophils Relative: 10 %
HCT: 37.9 % (ref 35.0–47.0)
HEMOGLOBIN: 13 g/dL (ref 12.0–16.0)
LYMPHS ABS: 1.3 10*3/uL (ref 1.0–3.6)
Lymphocytes Relative: 20 %
MCH: 34.5 pg — AB (ref 26.0–34.0)
MCHC: 34.2 g/dL (ref 32.0–36.0)
MCV: 100.7 fL — ABNORMAL HIGH (ref 80.0–100.0)
Monocytes Absolute: 0.7 10*3/uL (ref 0.2–0.9)
Monocytes Relative: 11 %
NEUTROS ABS: 4 10*3/uL (ref 1.4–6.5)
NEUTROS PCT: 58 %
Platelets: 154 10*3/uL (ref 150–440)
RBC: 3.76 MIL/uL — AB (ref 3.80–5.20)
RDW: 13.6 % (ref 11.5–14.5)
WBC: 6.8 10*3/uL (ref 3.6–11.0)

## 2015-08-16 LAB — LACTATE DEHYDROGENASE: LDH: 121 U/L (ref 98–192)

## 2015-08-16 LAB — COMPREHENSIVE METABOLIC PANEL
ALBUMIN: 3.9 g/dL (ref 3.5–5.0)
ALK PHOS: 70 U/L (ref 38–126)
ALT: 9 U/L — AB (ref 14–54)
AST: 13 U/L — ABNORMAL LOW (ref 15–41)
Anion gap: 4 — ABNORMAL LOW (ref 5–15)
BUN: 30 mg/dL — ABNORMAL HIGH (ref 6–20)
CALCIUM: 9 mg/dL (ref 8.9–10.3)
CO2: 25 mmol/L (ref 22–32)
CREATININE: 1.18 mg/dL — AB (ref 0.44–1.00)
Chloride: 106 mmol/L (ref 101–111)
GFR calc Af Amer: 52 mL/min — ABNORMAL LOW (ref >60)
GFR calc non Af Amer: 45 mL/min — ABNORMAL LOW (ref >60)
GLUCOSE: 107 mg/dL — AB (ref 65–99)
Potassium: 3 mmol/L — ABNORMAL LOW (ref 3.5–5.1)
SODIUM: 135 mmol/L (ref 135–145)
Total Bilirubin: 0.4 mg/dL (ref 0.3–1.2)
Total Protein: 6.6 g/dL (ref 6.5–8.1)

## 2015-08-16 MED ORDER — HEPARIN SOD (PORK) LOCK FLUSH 100 UNIT/ML IV SOLN
500.0000 [IU] | Freq: Once | INTRAVENOUS | Status: AC
  Administered 2015-08-16: 500 [IU] via INTRAVENOUS
  Filled 2015-08-16: qty 5

## 2015-08-16 MED ORDER — SODIUM CHLORIDE 0.9% FLUSH
10.0000 mL | INTRAVENOUS | Status: DC | PRN
  Administered 2015-08-16: 10 mL via INTRAVENOUS
  Filled 2015-08-16: qty 10

## 2015-08-16 NOTE — Progress Notes (Signed)
Patient states she has "coughing spells" about 3 times a day when she cannot stop coughing but never gets anything up.

## 2015-08-19 ENCOUNTER — Encounter: Payer: Self-pay | Admitting: Oncology

## 2015-08-19 NOTE — Progress Notes (Signed)
[place] @ [facility] Telephone:(336) [phone]  Fax:(336) [place]: [birth year]  MR#: [id number]  INO#:[id number]  Patient Care Team: Madelyn Brunner, MD as PCP - General (Unknown Physician Specialty) Madelyn Brunner, MD as Referring Physician (Unknown Physician Specialty) Forest Gleason, MD (Unknown Physician Specialty) Christene Lye, MD (General Surgery)  CHIEF COMPLAINT:  Chief Complaint  Patient presents with  . Lymphoma   Oncology History   1.  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  Status post 6 cycles of chemotherapy with CHOP9nd involved field radiation treatment 18,00 rads to the mantle field  and mediasinal boost 2340. finished radiation and chemotherapy in November of 2000 residual mediastinotomy mass 2 cm 2.  In May of 2001 progressive disease with mediastinal mass becoming bigger 8 cm3.  The patient underwent 2 cycles of chemotherapy withRituxan, and ICE.(June of 2001) [facility]dose chemotherapy with  BEAM finished treatment in October of 2001. patient had significant pancytopenia as well as fever lasting for 3 months after transplant. Recurrent sinus infection and pulmonary infection 3.  Patient  underwent EUS biopsy of which was negative .  (November, 2012) 4.  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  Came today for the follow-up.  No chills.  No fever.  Patient had a PET scan done patient also had a recurrent pulmonary infection with multiple admission in the hospital.  During last admission patient  Patient came as an acute add-on.  Started complaining low-grade fever and increasing cough yellowish expectoration.  Patient is on intermittent antibiotic therapy presently on doxycycline. Here for further follow-up and a chest x-ray done in the lab data was evaluated.  No chest pain.  No hemoptysis. Patient continues to have cough and shortness of breath getting intermittent antibiotic therapy.  No chills.  No fever.  Patient has extensive pulmonary infiltrate which has been biopsied last bronchoscopy was in May of 2016 REVIEW OF SYSTEMS:   No status: Patient is feeling weak and tired.  No chills.  No fever. Lungs: On oxygen.  Shortness of breath on exertion.  Low-grade fever.  Increasing cough and yellowish expectoration.  No hemoptysis.  Cardiac: No chest pain. Gastro intestinal system: Patient complains of mild nausea with doxycycline No heartburn.  No nausea or vomiting.  No abdominal pain.  No diarrhea.  No constipation.  No rectal bleeding. Neurological system: No dizziness.  No tingling.  His was.  no tingling numbness.  no focal weakness  or any focal signs. Skin: No evidence of ecchymosis or rash. Lower extremity no edema Skin no skeletal system no bony pain. Is and did not have any pulmonary infection in last 4 weeks.  Had dizziness with Levaquin but started taking in the night and did not have any problem. As per HPI. Otherwise, a complete review of systems is negatve.  PAST MEDICAL HISTORY: Past Medical History  Diagnosis Date  . Non Hodgkin's lymphoma  ([place])   . Asthma   . Acute respiratory failure ([place]) 11/29/2014  . Multiple myeloma ([place]) 12/01/2014  . 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 Nasal sinus surgery  08/07/05 09/09/05    x 2   . Video bronchoscopy Bilateral 11/30/2014    Procedure: VIDEO BRONCHOSCOPY WITHOUT FLUORO;  Surgeon: Flora Lipps, MD;  Location: [facility] ORS;  Service: Cardiopulmonary;  Laterality: Bilateral;    FAMILY HISTORY Family History  Problem Relation Age of Onset  . Asthma Son     ADVANCED DIRECTIVES:  Patient does have advanced health care directive HEALTH MAINTENANCE: Social History  Substance Use Topics  . Smoking status: Never Smoker   . Smokeless tobacco: Never Used  . Alcohol Use: Yes      Allergies  Allergen Reactions  . Hydrocodone-Homatropine Itching  . Tussionex Pennkinetic Er [Hydrocod Polst-Cpm Polst Er] Itching  . Ciprofloxacin Rash    Current Outpatient Prescriptions  Medication Sig Dispense Refill  . albuterol (PROVENTIL HFA;VENTOLIN HFA) 108 (90 BASE) MCG/ACT inhaler Inhale 1 puff into the lungs every 6 (six) hours as needed for wheezing or shortness of breath.    [name] amoxicillin-clavulanate (AUGMENTIN) 875-125 MG tablet Take 1 tablet by mouth 2 (two) times daily. 14 tablet 0  . aspirin EC 81 MG tablet Take 81 mg by mouth daily.    . cetirizine (ZYRTEC) 10 MG tablet Take 10 mg by mouth daily as needed for allergies.     . citalopram (CELEXA) 20 MG tablet Take 1 tablet (20 mg total) by mouth daily. 30 tablet 3  . doxycycline (VIBRA-TABS) 100 MG tablet Take 1 tablet (100 mg total) by mouth 2 (two) times daily. For 15 days. 30 tablet 3  . Fluticasone-Salmeterol (ADVAIR DISKUS) 250-50 MCG/DOSE AEPB Inhale 1 puff into the lungs 2 (two) times daily.     [name] guaiFENesin (MUCINEX) 600 MG 12 hr tablet Take 600 mg by mouth 2 (two) times daily as needed for to loosen phlegm.    [name]  levothyroxine (SYNTHROID, LEVOTHROID) 25 MCG tablet Take 25 mcg by mouth daily.      [name] lidocaine-prilocaine (EMLA) cream Apply 1 application topically as needed. 30 g 3  . omeprazole (PRILOSEC) 20 MG capsule Take 1 capsule (20 mg total) by mouth daily. 15 capsule 0  . ondansetron (ZOFRAN) 4 MG tablet Take 1 tablet (4 mg total) by mouth every 8 (eight) hours as needed for nausea or vomiting. 30 tablet 1  . pantoprazole (PROTONIX) 40 MG tablet Take 1 tablet (40 mg total) by mouth at bedtime. 1 tablet 6  . potassium chloride SA (K-DUR,KLOR-CON) 20 MEQ tablet Take 20 mEq by mouth 2 (two) times daily.    . predniSONE (DELTASONE) 10 MG tablet Start '60mg'$  taper by '10mg'$  daily to 0 21 tablet 0  . senna-docusate (SENOKOT-S) 8.6-50 MG per tablet Take 1 tablet by mouth at bedtime as needed for mild constipation. 60 tablet 6  .  traZODone (DESYREL) 100 MG tablet Take 1 tablet (100 mg total) by mouth at bedtime. 30 tablet 3   No current facility-administered medications for this visit.    OBJECTIVE:  Filed Vitals:   08/16/15 1506  BP: 119/81  Pulse: 98  Temp: 96.6 F (35.9 C)  Resp: 18     Body mass index is 21.88 kg/(m^2).    ECOG FS:2 - Symptomatic, <50% confined to bed  PHYSICAL EXAM: Gen. Status: Patient is alert and oriented on oxygen. In  Wheelchair Head exam was generally normal. There was no scleral icterus or corneal arcus. Mucous membranes were moist. Abdomen: Soft liver and spleen not palpable no ascites Lungs: Emphysematous chest.  Bilateral rhonchi.  Right more than left Diminished and entry. Examination of the skin revealed no evidence of significant rashes, suspicious appearing nevi or other concerning lesions. Neurologically, the patient was awake, alert, and oriented to person, place and time. There were no obvious focal neurologic abnormalities. Cardiac exam revealed the PMI to be normally situated and sized. The rhythm was regular and no extrasystoles were noted during several  minutes of auscultation. The first and second heart sounds were normal and physiologic splitting of the second heart sound was noted. There were no murmurs, rubs, clicks, or gallops. Lymphatic system: No palpable supraclavicular or cervical adenopathy   LAB RESULTS:  Appointment on 08/16/2015  Component Date Value Ref Range Status  . WBC 08/16/2015 6.8  3.6 - 11.0 K/uL Final  . RBC 08/16/2015 3.76* 3.80 - 5.20 MIL/uL Final  . Hemoglobin 08/16/2015 13.0  12.0 - 16.0 g/dL Final  . HCT 08/16/2015 37.9  35.0 - 47.0 % Final  . MCV 08/16/2015 100.7* 80.0 - 100.0 fL Final  . MCH 08/16/2015 34.5* 26.0 - 34.0 pg Final  . MCHC 08/16/2015 34.2  32.0 - 36.0 g/dL Final  . RDW 08/16/2015 13.6  11.5 - 14.5 % Final  . Platelets 08/16/2015 154  150 - 440 K/uL Final  . Neutrophils Relative % 08/16/2015 58   Final  . Neutro Abs 08/16/2015 4.0  1.4 - 6.5 K/uL Final  . Lymphocytes Relative 08/16/2015 20   Final  . Lymphs Abs 08/16/2015 1.3  1.0 - 3.6 K/uL Final  . Monocytes Relative 08/16/2015 11   Final  . Monocytes Absolute 08/16/2015 0.7  0.2 - 0.9 K/uL Final  . Eosinophils Relative 08/16/2015 10   Final  . Eosinophils Absolute 08/16/2015 0.7  0 - 0.7 K/uL Final  . Basophils Relative 08/16/2015 1   Final  . Basophils Absolute 08/16/2015 0.1  0 - 0.1 K/uL Final  . Sodium 08/16/2015 135  135 - 145 mmol/L Final  . Potassium 08/16/2015 3.0* 3.5 - 5.1 mmol/L Final  . Chloride 08/16/2015 106  101 - 111 mmol/L Final  . CO2 08/16/2015 25  22 - 32 mmol/L Final  . Glucose, Bld 08/16/2015 107* 65 - 99 mg/dL Final  . BUN 08/16/2015 30* 6 - 20 mg/dL Final  . Creatinine, Ser 08/16/2015 1.18* 0.44 - 1.00 mg/dL Final  . Calcium 08/16/2015 9.0  8.9 - 10.3 mg/dL Final  . Total Protein 08/16/2015 6.6  6.5 - 8.1 g/dL Final  . Albumin 08/16/2015 3.9  3.5 - 5.0 g/dL Final  . AST 08/16/2015 13* 15 - 41 U/L Final  . ALT 08/16/2015 9* 14 - 54 U/L Final  . Alkaline Phosphatase 08/16/2015 70  38 - 126 U/L Final  . Total  Bilirubin 08/16/2015 0.4  0.3 - 1.2 mg/dL Final  . GFR  calc non Af Amer 08/16/2015 45* >60 mL/min Final  . GFR calc Af Amer 08/16/2015 52* >60 mL/min Final   Comment: (NOTE) The eGFR has been calculated using the CKD EPI equation. This calculation has not been validated in all clinical situations. eGFR's persistently <60 mL/min signify possible Chronic Kidney Disease.   . Anion gap 08/16/2015 4* 5 - 15 Final  . LDH 08/16/2015 121  98 - 192 U/L Final       ASSESSMENT: Diffuse loss B-cell lymphoma recurrent disease status post chemotherapy Recurrent pulmonary infection Extensive pulmonary infiltrates secondary radiation Previous chemotherapy  On clinical exam there is no evidence of recurrent disease We will continue alternating antibiotic therapy Treatment patient with steroid and antibiotic event of an acute respiratory infection Patient has been referred for continuing follow-up with pulmonologistDr.Kasa Reevaluate patient in 3 months or before if there is any problem   Patient expressed understanding and was in agreement with this plan. She also understands that She can call clinic at any time with any questions, concerns, or complaints.    No matching staging information was found for the patient.  Forest Gleason, MD   08/19/2015 9:26 AM

## 2015-08-21 ENCOUNTER — Other Ambulatory Visit: Payer: Self-pay | Admitting: *Deleted

## 2015-08-21 MED ORDER — LEVOFLOXACIN 500 MG PO TABS: 500.0000 mg | ORAL_TABLET | Freq: Every day | ORAL | Status: DC

## 2015-08-21 MED ORDER — POTASSIUM CHLORIDE CRYS ER 20 MEQ PO TBCR: 20.0000 meq | EXTENDED_RELEASE_TABLET | Freq: Two times a day (BID) | ORAL | Status: DC

## 2015-08-21 MED ORDER — LEVOTHYROXINE SODIUM 25 MCG PO TABS: 25.0000 ug | ORAL_TABLET | Freq: Every day | ORAL | Status: DC

## 2015-08-21 NOTE — Telephone Encounter (Signed)
Escribed

## 2015-08-28 ENCOUNTER — Other Ambulatory Visit: Payer: Self-pay | Admitting: *Deleted

## 2015-08-28 MED ORDER — FLUTICASONE-SALMETEROL 115-21 MCG/ACT IN AERO: 2.0000 | INHALATION_SPRAY | Freq: Two times a day (BID) | RESPIRATORY_TRACT | Status: DC

## 2015-09-01 ENCOUNTER — Encounter: Payer: Self-pay | Admitting: Internal Medicine

## 2015-09-01 ENCOUNTER — Ambulatory Visit (INDEPENDENT_AMBULATORY_CARE_PROVIDER_SITE_OTHER): Payer: Medicare Other | Admitting: Internal Medicine

## 2015-09-01 VITALS — BP 128/72 | HR 85 | Ht 63.0 in | Wt 124.0 lb

## 2015-09-01 DIAGNOSIS — J069 Acute upper respiratory infection, unspecified: Secondary | ICD-10-CM

## 2015-09-01 DIAGNOSIS — R0602 Shortness of breath: Secondary | ICD-10-CM | POA: Diagnosis not present

## 2015-09-01 DIAGNOSIS — R059 Cough, unspecified: Secondary | ICD-10-CM

## 2015-09-01 DIAGNOSIS — R05 Cough: Secondary | ICD-10-CM | POA: Diagnosis not present

## 2015-09-01 HISTORY — DX: Acute upper respiratory infection, unspecified: J06.9

## 2015-09-01 NOTE — Progress Notes (Signed)
[facility] Consultation    Date: 09/20/2015  MRN# [id number] Anna Mcbride 1946/03/26  Referring Physician: Dr. Jeb Levering PMD - Dr. Gilford Rile [facility]) Anna Mcbride is a 70 y.o. old female seen in consultation for recurrent pulmonary infection  CC:  Chief Complaint  Patient presents with  . Advice Only    SOB; prod cough w/clear mucus, acid reflux; no chest tightness/CP    HPI:   patient presents today as a new pulmonary evaluation for recurrent respiratory tract infections requiring alternating doses of doxycycline and Levaquin along with intermittent prednisone over the last 6-8 months.  patient hass a history of diffuse large B-cell lymphoma, s/p right chest wall chemo port,  Chronic pulmonary infiltrates from radiation,  Status post bronchoscopy by Dr. Mortimer Fries x 2 for LAD (2012, 2014), seen in consultation for recurrent pulmonary infections. At today's visit she is accompanied by her husband. Per patient and chart review, she has had chronic cough and sputum production since May 2016, and has been on alternating doses of Levaquin and doxycycline every other week since then; Prednisone is sometimes added intermittently, last used in Dec 2016.   Currently patient is alternating a 15 day course of doxycycline , followed by a 7 day course of Levaquin followed by a one-week break of antibiotics , and then starts the cycle again. Today she is on her doxycycline regimen, which she started on Feb 1st 2017. She endorses dry cough, coughing spells, lasting 10-15 minutes 1-2 times per day, cough may be productive with clear sputum at times. She denies any fever, chills, significant shortness of breath with exertion. She attended the gym last Sunday, went swimming in the pool, today complains of a mild right-sided earache.  overall her major complaint is cough with  Coughing spells.    Review of Chart ONCOLOGY History 1. 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  stage II B with pleural effusion. Status post 6 cycles of chemotherapy with CHOP9nd involved field radiation treatment 18,00 rads to the mantle field and mediasinal boost 2340. finished radiation and chemotherapy in November of 2000 residual mediastinotomy mass 2 cm 2. In May of 2001 progressive disease with mediastinal mass becoming bigger 8 cm3. The patient underwent 2 cycles of chemotherapy withRituxan, and ICE.(June of 2001) Followed by 7more cycles at [facility] with DHAP.and high-dose chemotherapy with BEAM finished treatment in October of 2001. patient had significant pancytopenia as well as fever lasting for 3 months after transplant. Recurrent sinus infection and pulmonary infection 3. Patient underwent EUS biopsy of which was negative . (November, 2012) 4. 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 Started on RICE october 2014 6.ppatient is now being considered for high-dose chemotherapy and stem cell support (March, 2015) 7.2nd stem cell transplant cannot be done because of the no stem cell to harvest PMHX:   Past Medical History  Diagnosis Date  . Non Hodgkin's lymphoma ([place])   . Asthma   . Acute respiratory failure ([place]) 11/29/2014  . 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 Nasal sinus surgery  08/07/05 09/09/05    x 2   . Video bronchoscopy Bilateral 11/30/2014    Procedure: VIDEO BRONCHOSCOPY WITHOUT FLUORO;  Surgeon: Flora Lipps, MD;  Location: [facility] ORS;  Service: Cardiopulmonary;  Laterality: Bilateral;   Family  Hx:  Family History  Problem Relation Age of Onset  . Asthma Son    Social Hx:   Social History  Substance Use Topics  . Smoking status: Never Smoker   . Smokeless tobacco: Never Used  . Alcohol Use: Yes   Medication:   Current Outpatient Rx  Name  Route  Sig  Dispense  Refill   . albuterol (PROVENTIL HFA;VENTOLIN HFA) 108 (90 BASE) MCG/ACT inhaler   Inhalation   Inhale 1 puff into the lungs every 6 (six) hours as needed for wheezing or shortness of breath.         [name] aspirin EC 81 MG tablet   Oral   Take 81 mg by mouth daily.         . citalopram (CELEXA) 20 MG tablet   Oral   Take 1 tablet (20 mg total) by mouth daily.   30 tablet   3   . doxycycline (VIBRA-TABS) 100 MG tablet   Oral   Take 1 tablet (100 mg total) by mouth 2 (two) times daily. For 15 days.   30 tablet   3   . fluticasone-salmeterol (ADVAIR HFA) 115-21 MCG/ACT inhaler   Inhalation   Inhale 2 puffs into the lungs 2 (two) times daily.   1 Inhaler   12   . levofloxacin (LEVAQUIN) 500 MG tablet   Oral   Take 1 tablet (500 mg total) by mouth daily. Start taking after doxycycline for 7 days then take 1 week off.   7 tablet   3   . levothyroxine (SYNTHROID, LEVOTHROID) 25 MCG tablet   Oral   Take 1 tablet (25 mcg total) by mouth daily.   30 tablet   0   . lidocaine-prilocaine (EMLA) cream   Topical   Apply 1 application topically as needed.   30 g   3   . ondansetron (ZOFRAN) 4 MG tablet   Oral   Take 1 tablet (4 mg total) by mouth every 8 (eight) hours as needed for nausea or vomiting.   30 tablet   1   . potassium chloride SA (K-DUR,KLOR-CON) 20 MEQ tablet   Oral   Take 1 tablet (20 mEq total) by mouth 2 (two) times daily.   30 tablet   0   . omeprazole (PRILOSEC) 20 MG capsule   Oral   Take 1 capsule (20 mg total) by mouth 2 (two) times daily before a meal.   60 capsule   3       Allergies:  Hydrocodone-homatropine; Tussionex pennkinetic er; and Ciprofloxacin  Review of Systems  Constitutional: Negative for fever and chills.  HENT: Positive for ear pain, hearing loss and sore throat. Negative for congestion and ear discharge.        Right-sided earache for 1 week  Eyes: Negative for double vision.  Respiratory: Positive for cough, sputum production,  shortness of breath and wheezing.   Cardiovascular: Negative for chest pain, palpitations and orthopnea.  Gastrointestinal: Negative for heartburn, nausea, vomiting and abdominal pain.  Genitourinary: Negative for dysuria.  Musculoskeletal: Positive for myalgias.  Neurological: Negative for dizziness and headaches.  Psychiatric/Behavioral: Negative for depression.     Physical Examination:   VS: BP 128/72 mmHg  Pulse 85  Ht 5\' 3"  (1.6 m)  Wt 124 lb (56.246 kg)  BMI 21.97 kg/m2  SpO2 95%  General Appearance: No distress  Neuro:without focal findings, mental status, speech normal, alert and oriented, cranial nerves 2-12 intact, reflexes normal and symmetric, sensation  grossly normal  HEENT: PERRLA, EOM intact, no ptosis, no other lesions noticed, right ear canal with no sig erythema, could not visualize the TM due to cerumen impaction ; Mallampati 2 Pulmonary: normal breath sounds., diaphragmatic excursion normal.No wheezing, No rales;   Sputum Production:   CardiovascularNormal S1,S2.  No m/r/g.  Abdominal aorta pulsation normal.    Abdomen: Benign, Soft, non-tender, No masses, hepatosplenomegaly, No lymphadenopathy Renal:  No costovertebral tenderness  GU:  No performed at this time. Endoc: No evident thyromegaly, no signs of acromegaly or Cushing features Skin:   warm, no rashes, no ecchymosis  Extremities: normal, no cyanosis, clubbing, no edema, warm with normal capillary refill. Other findings:none   (The following images and results were reviewed by Dr. Stevenson Clinch on 09/20/2015).  CXR 2 View - 07/04/15 EXAM: CHEST 2 VIEW  COMPARISON: CT 11/29/2014. Chest x-ray 11/29/2014, 09/28/2014.  FINDINGS: Port-A-Cath noted in stable position. Heart size normal. Right apical pleural parenchymal thickening noted consistent with scarring, radiation chain. Bilateral and basilar interstitial changes noted consistent with scarring. Heart size normal. No pneumothorax. No acute bony  abnormality.  IMPRESSION: Bilateral diffuse changes of pleural parenchymal scarring, radiation change, particularly prominent in the upper lobes. No acute abnormality.   CT Chest 11/29/2014 CLINICAL DATA: Recurrent large cell lymphoma presenting with shortness of breath, cough, and fever  EXAM: CT ANGIOGRAPHY CHEST WITH CONTRAST  TECHNIQUE: Multidetector CT imaging of the chest was performed using the standard protocol during bolus administration of intravenous contrast. Multiplanar CT image reconstructions and MIPs were obtained to evaluate the vascular anatomy.  CONTRAST: 19mL OMNIPAQUE IOHEXOL 350 MG/ML SOLN  COMPARISON: Chest CT April 18, 2014 and chest radiograph Nov 29, 2014  FINDINGS: There is no demonstrable pulmonary embolus. There is no thoracic aortic aneurysm or dissection.  There is underlying centrilobular emphysematous change. There is loculated effusion on the right with consolidation arising in the right perihilar region with extension into the right upper lobe. Some of this change is felt to be of post radiation therapy etiology. There is moderate volume loss throughout this area. There is patchy airspace consolidation at multiple sites in the right lower lobe, characterized by several ill-defined semi-solid opacities. There is also patchy atelectasis in the lingula, primarily inferiorly. There is focal volume loss in the left perihilar region which is stable and felt to be primarily of radiation therapy etiology. There is mild lower lobe bronchiectatic change bilaterally.  Thyroid appears unremarkable. There is no apparent adenopathy. The pericardium is not thickened.  In the visualized upper abdomen, there is atherosclerotic change in aorta. There is hepatic steatosis.  There are no blastic or lytic bone lesions. Port-A-Cath tip is in the superior vena cava.  Review of the MIP images confirms the above  findings.  IMPRESSION: No demonstrable pulmonary embolus.  Post radiation therapy change in both perihilar regions. Areas of patchy ground-glass type opacity in the right lower lobe, likely representing pneumonia. Atypical presentation of neoplasm is possible; a followup study in 6 day weeks may be advisable to further assess this region.  There is underlying emphysema. There is mild lower lobe bronchiectatic change.  No adenopathy.  Moderate loculated effusion on the right.  Hepatic steatosis.  Port-A-Cath tip in superior vena cava.     Assessment and Plan: Recurrent upper respiratory infection (URI) Has a history of diffuse large B-cell lymphoma, chronic infiltrates suspected to be from radiation pneumonitis, now with recurrent upper story tract infections currently being treated with alternating doses of antibiotics (doxycycline alternating with  Levaquin). At this time, she is advised to continue with her current antibiotic regiment. She has a repeat CT scan of chest on 09/05/2015, she continues to have predominant right-sided infiltrates, she may need another bronchoscopy with transbronchial biopsy and a BAL. However if it is improving then she may be observed with surveillance CT. At this time I do not believe she needs chronic long-term antibiotics for which she is currently receiving, as this could induce further antibiotic resistance and diminish normal flora. If infiltrates are persistent, and bronchoscopy is not favorable, may need to consider a wedge biopsy.  Plan: -Continue with current antibiotic regimen -Continue with Advair -Continue supplemental oxygen as needed -Patient has follow-up with Dr. Mortimer Fries, at that time CT chest results were reviewed and further determination for any intervention will be discussed at this visit.   Cough Multifactorial: History of radiation pneumonitis, recurrent upper story tract infection, deconditioning  Continue with diet  and exercise as tolerated, continue with incentive spirometry.  SOB (shortness of breath) Multifactorial: Radiation pneumonitis, history of large B-cell lymphoma, recurrent upper story tract infection. Continue with alternating antibiotics, further workup as stated for recurrent upper respiratory tract infection.    Updated Medication List Outpatient Encounter Prescriptions as of 09/01/2015  Medication Sig  . albuterol (PROVENTIL HFA;VENTOLIN HFA) 108 (90 BASE) MCG/ACT inhaler Inhale 1 puff into the lungs every 6 (six) hours as needed for wheezing or shortness of breath.  [name] aspirin EC 81 MG tablet Take 81 mg by mouth daily.  . citalopram (CELEXA) 20 MG tablet Take 1 tablet (20 mg total) by mouth daily.  [name] doxycycline (VIBRA-TABS) 100 MG tablet Take 1 tablet (100 mg total) by mouth 2 (two) times daily. For 15 days.  . fluticasone-salmeterol (ADVAIR HFA) 115-21 MCG/ACT inhaler Inhale 2 puffs into the lungs 2 (two) times daily.  [name] levofloxacin (LEVAQUIN) 500 MG tablet Take 1 tablet (500 mg total) by mouth daily. Start taking after doxycycline for 7 days then take 1 week off.  . levothyroxine (SYNTHROID, LEVOTHROID) 25 MCG tablet Take 1 tablet (25 mcg total) by mouth daily.  [name] lidocaine-prilocaine (EMLA) cream Apply 1 application topically as needed.  . ondansetron (ZOFRAN) 4 MG tablet Take 1 tablet (4 mg total) by mouth every 8 (eight) hours as needed for nausea or vomiting.  . potassium chloride SA (K-DUR,KLOR-CON) 20 MEQ tablet Take 1 tablet (20 mEq total) by mouth 2 (two) times daily.  . [DISCONTINUED] amoxicillin-clavulanate (AUGMENTIN) 875-125 MG tablet Take 1 tablet by mouth 2 (two) times daily.  . [DISCONTINUED] cetirizine (ZYRTEC) 10 MG tablet Take 10 mg by mouth daily as needed for allergies.   . [DISCONTINUED] guaiFENesin (MUCINEX) 600 MG 12 hr tablet Take 600 mg by mouth 2 (two) times daily as needed for to loosen phlegm.  . [DISCONTINUED] omeprazole (PRILOSEC) 20 MG capsule Take 1  capsule (20 mg total) by mouth daily.  . [DISCONTINUED] pantoprazole (PROTONIX) 40 MG tablet Take 1 tablet (40 mg total) by mouth at bedtime.  . [DISCONTINUED] predniSONE (DELTASONE) 10 MG tablet Start 60mg  taper by 10mg  daily to 0  . [DISCONTINUED] senna-docusate (SENOKOT-S) 8.6-50 MG per tablet Take 1 tablet by mouth at bedtime as needed for mild constipation.  . [DISCONTINUED] traZODone (DESYREL) 100 MG tablet Take 1 tablet (100 mg total) by mouth at bedtime.   No facility-administered encounter medications on file as of 09/01/2015.    Orders for this visit: No orders of the defined types were placed in this encounter.  Thank  you for the consultation and for allowing [facility] to assist in the care of your patient. Our recommendations are noted above.  Please contact us if we can be of further service.   Vilinda Boehringer, MD [facility] Office Number: [phone]  Note: This note was prepared with Dragon dictation along with smaller phrase technology. Any transcriptional errors that result from this process are unintentional.

## 2015-09-01 NOTE — Patient Instructions (Signed)
Follow up with Dr. Mortimer Fries in: 1 month - cont with your current antibiotic regiment - cont with advair (gargle and rinse after each use) - f/u with ENT for Right earache - cont with exercise as tolerated.

## 2015-09-05 ENCOUNTER — Ambulatory Visit
Admission: RE | Admit: 2015-09-05 | Discharge: 2015-09-05 | Disposition: A | Discharge status: Home / Self Care | Payer: Medicare Other | Source: Ambulatory Visit | Attending: Oncology | Admitting: Oncology

## 2015-09-05 DIAGNOSIS — R918 Other nonspecific abnormal finding of lung field: Secondary | ICD-10-CM | POA: Insufficient documentation

## 2015-09-05 DIAGNOSIS — R05 Cough: Secondary | ICD-10-CM | POA: Diagnosis present

## 2015-09-05 DIAGNOSIS — C8590 Non-Hodgkin lymphoma, unspecified, unspecified site: Secondary | ICD-10-CM | POA: Diagnosis present

## 2015-09-05 DIAGNOSIS — R0600 Dyspnea, unspecified: Secondary | ICD-10-CM | POA: Diagnosis present

## 2015-09-05 DIAGNOSIS — R059 Cough, unspecified: Secondary | ICD-10-CM

## 2015-09-05 MED ORDER — IOHEXOL 300 MG/ML  SOLN
75.0000 mL | Freq: Once | INTRAMUSCULAR | Status: AC | PRN
  Administered 2015-09-05: 75 mL via INTRAVENOUS

## 2015-09-07 ENCOUNTER — Inpatient Hospital Stay: Payer: Medicare Other | Attending: Oncology

## 2015-09-07 ENCOUNTER — Inpatient Hospital Stay (HOSPITAL_BASED_OUTPATIENT_CLINIC_OR_DEPARTMENT_OTHER): Payer: Medicare Other | Admitting: Oncology

## 2015-09-07 VITALS — BP 131/88 | HR 91 | Temp 96.1°F | Resp 18 | Wt 123.5 lb

## 2015-09-07 DIAGNOSIS — Z7982 Long term (current) use of aspirin: Secondary | ICD-10-CM | POA: Diagnosis not present

## 2015-09-07 DIAGNOSIS — Z79899 Other long term (current) drug therapy: Secondary | ICD-10-CM | POA: Insufficient documentation

## 2015-09-07 DIAGNOSIS — C8330 Diffuse large B-cell lymphoma, unspecified site: Secondary | ICD-10-CM | POA: Diagnosis not present

## 2015-09-07 DIAGNOSIS — R05 Cough: Secondary | ICD-10-CM

## 2015-09-07 DIAGNOSIS — R509 Fever, unspecified: Secondary | ICD-10-CM

## 2015-09-07 DIAGNOSIS — R531 Weakness: Secondary | ICD-10-CM | POA: Diagnosis not present

## 2015-09-07 DIAGNOSIS — C8590 Non-Hodgkin lymphoma, unspecified, unspecified site: Secondary | ICD-10-CM

## 2015-09-07 DIAGNOSIS — R0600 Dyspnea, unspecified: Secondary | ICD-10-CM

## 2015-09-07 DIAGNOSIS — Z9484 Stem cells transplant status: Secondary | ICD-10-CM | POA: Insufficient documentation

## 2015-09-07 DIAGNOSIS — Z452 Encounter for adjustment and management of vascular access device: Secondary | ICD-10-CM | POA: Insufficient documentation

## 2015-09-07 DIAGNOSIS — Z923 Personal history of irradiation: Secondary | ICD-10-CM

## 2015-09-07 DIAGNOSIS — D630 Anemia in neoplastic disease: Secondary | ICD-10-CM

## 2015-09-07 DIAGNOSIS — R918 Other nonspecific abnormal finding of lung field: Secondary | ICD-10-CM | POA: Diagnosis not present

## 2015-09-07 DIAGNOSIS — J90 Pleural effusion, not elsewhere classified: Secondary | ICD-10-CM | POA: Diagnosis not present

## 2015-09-07 DIAGNOSIS — Z9221 Personal history of antineoplastic chemotherapy: Secondary | ICD-10-CM | POA: Insufficient documentation

## 2015-09-07 DIAGNOSIS — R5383 Other fatigue: Secondary | ICD-10-CM | POA: Insufficient documentation

## 2015-09-07 DIAGNOSIS — J45909 Unspecified asthma, uncomplicated: Secondary | ICD-10-CM

## 2015-09-07 DIAGNOSIS — R059 Cough, unspecified: Secondary | ICD-10-CM

## 2015-09-07 LAB — COMPREHENSIVE METABOLIC PANEL
ALBUMIN: 4.2 g/dL (ref 3.5–5.0)
ALK PHOS: 73 U/L (ref 38–126)
ALT: 12 U/L — AB (ref 14–54)
AST: 13 U/L — ABNORMAL LOW (ref 15–41)
Anion gap: 5 (ref 5–15)
BUN: 34 mg/dL — AB (ref 6–20)
CALCIUM: 9.6 mg/dL (ref 8.9–10.3)
CO2: 26 mmol/L (ref 22–32)
CREATININE: 1.39 mg/dL — AB (ref 0.44–1.00)
Chloride: 107 mmol/L (ref 101–111)
GFR calc Af Amer: 43 mL/min — ABNORMAL LOW (ref >60)
GFR calc non Af Amer: 37 mL/min — ABNORMAL LOW (ref >60)
GLUCOSE: 99 mg/dL (ref 65–99)
Potassium: 3.8 mmol/L (ref 3.5–5.1)
SODIUM: 138 mmol/L (ref 135–145)
Total Bilirubin: 0.5 mg/dL (ref 0.3–1.2)
Total Protein: 7 g/dL (ref 6.5–8.1)

## 2015-09-07 LAB — CBC WITH DIFFERENTIAL/PLATELET
Basophils Absolute: 0 10*3/uL (ref 0–0.1)
Basophils Relative: 0 %
Eosinophils Absolute: 0.6 10*3/uL (ref 0–0.7)
Eosinophils Relative: 12 %
HCT: 38.5 % (ref 35.0–47.0)
Hemoglobin: 13.2 g/dL (ref 12.0–16.0)
Lymphocytes Relative: 25 %
Lymphs Abs: 1.3 10*3/uL (ref 1.0–3.6)
MCH: 34.5 pg — ABNORMAL HIGH (ref 26.0–34.0)
MCHC: 34.3 g/dL (ref 32.0–36.0)
MCV: 100.8 fL — ABNORMAL HIGH (ref 80.0–100.0)
Monocytes Absolute: 0.5 10*3/uL (ref 0.2–0.9)
Monocytes Relative: 9 %
Neutro Abs: 2.8 10*3/uL (ref 1.4–6.5)
Neutrophils Relative %: 54 %
Platelets: 176 10*3/uL (ref 150–440)
RBC: 3.82 MIL/uL (ref 3.80–5.20)
RDW: 13.8 % (ref 11.5–14.5)
WBC: 5.2 10*3/uL (ref 3.6–11.0)

## 2015-09-07 LAB — LACTATE DEHYDROGENASE: LDH: 132 U/L (ref 98–192)

## 2015-09-07 MED ORDER — OMEPRAZOLE 20 MG PO CPDR: 20.0000 mg | DELAYED_RELEASE_CAPSULE | Freq: Two times a day (BID) | ORAL | Status: DC

## 2015-09-07 NOTE — Progress Notes (Signed)
Patient continues to have cough.  Also c/o indigestion.  States she was started on Prilosec but it was only for 30 days.  States as soon as she eats she gets indigestion.

## 2015-09-08 ENCOUNTER — Encounter: Payer: Self-pay | Admitting: Oncology

## 2015-09-08 NOTE — Progress Notes (Signed)
Anna Mcbride @ [facility] Telephone:(336) [phone]  Fax:(336) [place]: 05-23-1946  MR#: [id number]  CHE#:[id number]  Patient Care Team: Madelyn Brunner, MD as PCP - General (Unknown Physician Specialty) Madelyn Brunner, MD as Referring Physician (Unknown Physician Specialty) Forest Gleason, MD (Unknown Physician Specialty) Christene Lye, MD (General Surgery)  CHIEF COMPLAINT:  Chief Complaint  Patient presents with  . Lymphoma   Oncology History   1.  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  Status post 6 cycles of chemotherapy with CHOP9nd involved field radiation treatment 18,00 rads to the mantle field  and mediasinal boost 2340. finished radiation and chemotherapy in November of 2000 residual mediastinotomy mass 2 cm 2.  In May of 2001 progressive disease with mediastinal mass becoming bigger 8 cm3.  The patient underwent 2 cycles of chemotherapy withRituxan, and ICE.(June of 2001) [facility]and high-dose chemotherapy with  BEAM finished treatment in October of 2001. patient had significant pancytopenia as well as fever lasting for 3 months after transplant. Recurrent sinus infection and pulmonary infection 3.  Patient  underwent EUS biopsy of which was negative .  (November, 2012) 4.  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  Came today for the follow-up.  No chills.  No fever.  Patient had a PET scan done patient also had a recurrent pulmonary infection with multiple admission in the hospital.  During last admission patient  Patient came as an acute add-on.  Started complaining low-grade fever and increasing cough yellowish expectoration.  Patient is on intermittent antibiotic therapy presently on doxycycline. Here for further follow-up and a chest x-ray done in the lab data was evaluated.  No chest pain.  No hemoptysis. Patient continues to have cough and shortness of breath getting intermittent antibiotic therapy.  No chills.  No fever.  Patient has extensive pulmonary infiltrate which has been biopsied last bronchoscopy was in May of 2016.  Patient was evaluated by pulmonologist.  A repeat CT scan was done.  Since last evaluation patient continues to have shortness of breath and dry hacking cough   The scan has been reviewed independently.  No chills.  No fever.  REVIEW OF SYSTEMS:   No status: Patient is feeling weak and tired.  No chills.  No fever. Lungs: On oxygen.  Shortness of breath on exertion.  Low-grade fever.  Increasing cough and yellowish expectoration.  No hemoptysis.  Cardiac: No chest pain. Gastro intestinal system: Patient complains of  mild nausea with doxycycline No heartburn.  No nausea or vomiting.  No abdominal pain.  No diarrhea.  No constipation.  No rectal bleeding. Neurological system: No dizziness.  No tingling.  His was.  no tingling numbness.  no focal weakness or any focal signs. Skin: No evidence of ecchymosis or rash. Lower extremity no edema Skin no skeletal system no bony pain. Is and did not have any pulmonary infection in last 4 weeks.  Had dizziness with  Levaquin but started taking in the night and did not have any problem. As per HPI. Otherwise, a complete review of systems is negatve.  PAST MEDICAL HISTORY: Past Medical History  Diagnosis Date  . Non Hodgkin's lymphoma ([place])   . Asthma   . Acute respiratory failure ([place]) 11/29/2014  . 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 Nasal sinus surgery  08/07/05 09/09/05    x 2   . Video bronchoscopy Bilateral 11/30/2014    Procedure: VIDEO BRONCHOSCOPY WITHOUT FLUORO;  Surgeon: Flora Lipps, MD;  Location: [facility] ORS;  Service: Cardiopulmonary;  Laterality: Bilateral;    FAMILY HISTORY Family History  Problem Relation Age of Onset  . Asthma Son     ADVANCED DIRECTIVES:  Patient does have advanced health care directive HEALTH MAINTENANCE: Social History  Substance Use Topics  . Smoking status: Never Smoker   . Smokeless tobacco: Never Used  . Alcohol Use: Yes      Allergies  Allergen Reactions  . Hydrocodone-Homatropine Itching  . Tussionex Pennkinetic Er [Hydrocod Polst-Cpm Polst Er] Itching  . Ciprofloxacin Rash    Current Outpatient Prescriptions  Medication Sig Dispense Refill  . albuterol (PROVENTIL HFA;VENTOLIN HFA) 108 (90 BASE) MCG/ACT inhaler Inhale 1 puff into the lungs every 6 (six) hours as needed for wheezing or shortness of breath.    [name] aspirin EC 81 MG tablet Take 81 mg by mouth daily.    . citalopram (CELEXA) 20 MG tablet Take 1 tablet (20 mg total) by mouth daily. 30 tablet 3  . doxycycline (VIBRA-TABS) 100 MG tablet Take 1 tablet (100 mg total) by mouth 2 (two) times daily. For 15 days. 30 tablet 3  . fluticasone-salmeterol (ADVAIR HFA) 115-21 MCG/ACT inhaler Inhale 2 puffs into the lungs 2 (two) times daily. 1 Inhaler 12  . levofloxacin (LEVAQUIN) 500 MG tablet Take 1 tablet (500 mg total) by mouth daily. Start taking after doxycycline for 7 days  then take 1 week off. 7 tablet 3  . levothyroxine (SYNTHROID, LEVOTHROID) 25 MCG tablet Take 1 tablet (25 mcg total) by mouth daily. 30 tablet 0  . lidocaine-prilocaine (EMLA) cream Apply 1 application topically as needed. 30 g 3  . omeprazole (PRILOSEC) 20 MG capsule Take 1 capsule (20 mg total) by mouth 2 (two) times daily before a meal. 60 capsule 3  . ondansetron (ZOFRAN) 4 MG tablet Take 1 tablet (4 mg total) by mouth every 8 (eight) hours as needed for nausea or vomiting. 30 tablet 1  . potassium chloride SA (K-DUR,KLOR-CON) 20 MEQ tablet Take 1 tablet (20 mEq total) by mouth 2 (two) times daily. 30 tablet 0   No current facility-administered medications for this visit.    OBJECTIVE:  Filed Vitals:   09/07/15 1412  BP: 131/88  Pulse: 91  Temp: 96.1 F (35.6 C)  Resp: 18     Body mass index is 21.88 kg/(m^2).  ECOG FS:2 - Symptomatic, <50% confined to bed  PHYSICAL EXAM: Gen. Status: Patient is alert and oriented on oxygen. In  Wheelchair Head exam was generally normal. There was no scleral icterus or corneal arcus. Mucous membranes were moist. Abdomen: Soft liver and spleen not palpable no ascites Lungs: Emphysematous chest.  Bilateral rhonchi.  Right more than left Diminished and entry. Examination of the skin revealed no evidence of significant rashes, suspicious appearing nevi or other concerning lesions. Neurologically, the patient was awake, alert, and oriented to person, place and time. There were no obvious focal neurologic abnormalities. Cardiac exam revealed the PMI to be normally situated and sized. The rhythm was regular and no extrasystoles were noted during several minutes of auscultation. The first and second heart sounds were normal and physiologic splitting of the second heart sound was noted. There were no murmurs, rubs, clicks, or gallops. Lymphatic system: No palpable supraclavicular or cervical adenopathy   LAB RESULTS:  Appointment on 09/07/2015    Component Date Value Ref Range Status  . WBC 09/07/2015 5.2  3.6 - 11.0 K/uL Final  . RBC 09/07/2015 3.82  3.80 - 5.20 MIL/uL Final  . Hemoglobin 09/07/2015 13.2  12.0 - 16.0 g/dL Final  . HCT 09/07/2015 38.5  35.0 - 47.0 % Final  . MCV 09/07/2015 100.8* 80.0 - 100.0 fL Final  . MCH 09/07/2015 34.5* 26.0 - 34.0 pg Final  . MCHC 09/07/2015 34.3  32.0 - 36.0 g/dL Final  . RDW 09/07/2015 13.8  11.5 - 14.5 % Final  . Platelets 09/07/2015 176  150 - 440 K/uL Final  . Neutrophils Relative % 09/07/2015 54   Final  . Neutro Abs 09/07/2015 2.8  1.4 - 6.5 K/uL Final  . Lymphocytes Relative 09/07/2015 25   Final  . Lymphs Abs 09/07/2015 1.3  1.0 - 3.6 K/uL Final  . Monocytes Relative 09/07/2015 9   Final  . Monocytes Absolute 09/07/2015 0.5  0.2 - 0.9 K/uL Final  . Eosinophils Relative 09/07/2015 12   Final  . Eosinophils Absolute 09/07/2015 0.6  0 - 0.7 K/uL Final  . Basophils Relative 09/07/2015 0   Final  . Basophils Absolute 09/07/2015 0.0  0 - 0.1 K/uL Final  . Sodium 09/07/2015 138  135 - 145 mmol/L Final  . Potassium 09/07/2015 3.8  3.5 - 5.1 mmol/L Final  . Chloride 09/07/2015 107  101 - 111 mmol/L Final  . CO2 09/07/2015 26  22 - 32 mmol/L Final  . Glucose, Bld 09/07/2015 99  65 - 99 mg/dL Final  . BUN 09/07/2015 34* 6 - 20 mg/dL Final  . Creatinine, Ser 09/07/2015 1.39* 0.44 - 1.00 mg/dL Final  . Calcium 09/07/2015 9.6  8.9 - 10.3 mg/dL Final  . Total Protein 09/07/2015 7.0  6.5 - 8.1 g/dL Final  . Albumin 09/07/2015 4.2  3.5 - 5.0 g/dL Final  . AST 09/07/2015 13* 15 - 41 U/L Final  . ALT 09/07/2015 12* 14 - 54 U/L Final  . Alkaline Phosphatase 09/07/2015 73  38 - 126 U/L Final  . Total Bilirubin 09/07/2015 0.5  0.3 - 1.2 mg/dL Final  . GFR calc non Af Amer 09/07/2015 37* >60 mL/min Final  . GFR calc Af Amer 09/07/2015 43* >60 mL/min Final   Comment: (NOTE) The eGFR has been calculated using the CKD EPI equation. This calculation has not been validated in all clinical  situations. eGFR's persistently <60 mL/min signify possible Chronic Kidney Disease.   . Anion gap 09/07/2015 5  5 -  15 Final  . LDH 09/07/2015 132  98 - 192 U/L Final       ASSESSMENT: Diffuse loss B-cell lymphoma recurrent disease status post chemotherapy Recurrent pulmonary infection Extensive pulmonary infiltrates secondary  TO   Radiation CT scan has been reviewed independently and does not show any major change.  I discussed situation with Dr. Stevenson Clinch and he is recommending the another bronchoscopy Total duration of visit was 25 minutes.  50% or more time was spent feeling CT scan as well as discussing situation with Dr. Stevenson Clinch, Pulmonologist , Pulmonologist continue alternating antibiotics and reevaluate patient in next few months   Patient expressed understanding and was in agreement with this plan. She also understands that She can call clinic at any time with any questions, concerns, or complaints.    No matching staging information was found for the patient.  Forest Gleason, MD   09/08/2015 11:31 AM

## 2015-09-14 ENCOUNTER — Inpatient Hospital Stay: Payer: Medicare Other

## 2015-09-14 DIAGNOSIS — Z95828 Presence of other vascular implants and grafts: Secondary | ICD-10-CM

## 2015-09-14 DIAGNOSIS — R509 Fever, unspecified: Secondary | ICD-10-CM | POA: Diagnosis not present

## 2015-09-14 MED ORDER — HEPARIN SOD (PORK) LOCK FLUSH 100 UNIT/ML IV SOLN
500.0000 [IU] | Freq: Once | INTRAVENOUS | Status: AC
  Administered 2015-09-14: 500 [IU] via INTRAVENOUS

## 2015-09-14 MED ORDER — SODIUM CHLORIDE 0.9% FLUSH
10.0000 mL | INTRAVENOUS | Status: DC | PRN
  Administered 2015-09-14: 10 mL via INTRAVENOUS
  Filled 2015-09-14: qty 10

## 2015-09-15 ENCOUNTER — Inpatient Hospital Stay: Payer: Medicare Other

## 2015-09-20 NOTE — Assessment & Plan Note (Signed)
Multifactorial: History of radiation pneumonitis, recurrent upper story tract infection, deconditioning  Continue with diet and exercise as tolerated, continue with incentive spirometry.

## 2015-09-20 NOTE — Assessment & Plan Note (Addendum)
Has a history of diffuse large B-cell lymphoma, chronic infiltrates suspected to be from radiation pneumonitis, now with recurrent upper story tract infections currently being treated with alternating doses of antibiotics (doxycycline alternating with Levaquin). At this time, she is advised to continue with her current antibiotic regiment. She has a repeat CT scan of chest on 09/05/2015, she continues to have predominant right-sided infiltrates, she may need another bronchoscopy with transbronchial biopsy and a BAL. However if it is improving then she may be observed with surveillance CT. At this time I do not believe she needs chronic long-term antibiotics for which she is currently receiving, as this could induce further antibiotic resistance and diminish normal flora. If infiltrates are persistent, and bronchoscopy is not favorable, may need to consider a wedge biopsy.  Plan: -Continue with current antibiotic regimen -Continue with Advair -Continue supplemental oxygen as needed -Patient has follow-up with Dr. Mortimer Fries, at that time CT chest results were reviewed and further determination for any intervention will be discussed at this visit.

## 2015-09-20 NOTE — Assessment & Plan Note (Signed)
Multifactorial: Radiation pneumonitis, history of large B-cell lymphoma, recurrent upper story tract infection. Continue with alternating antibiotics, further workup as stated for recurrent upper respiratory tract infection.

## 2015-09-22 ENCOUNTER — Other Ambulatory Visit: Payer: Self-pay | Admitting: *Deleted

## 2015-09-22 DIAGNOSIS — C8330 Diffuse large B-cell lymphoma, unspecified site: Secondary | ICD-10-CM

## 2015-09-22 MED ORDER — LEVOTHYROXINE SODIUM 25 MCG PO TABS: 25.0000 ug | ORAL_TABLET | Freq: Every day | ORAL | Status: DC

## 2015-09-27 ENCOUNTER — Inpatient Hospital Stay: Payer: Medicare Other | Attending: Oncology | Admitting: Oncology

## 2015-09-27 ENCOUNTER — Inpatient Hospital Stay: Payer: Medicare Other

## 2015-09-27 VITALS — BP 121/83 | HR 89 | Temp 95.6°F | Resp 18 | Wt 125.4 lb

## 2015-09-27 DIAGNOSIS — J189 Pneumonia, unspecified organism: Secondary | ICD-10-CM | POA: Diagnosis not present

## 2015-09-27 DIAGNOSIS — Z79899 Other long term (current) drug therapy: Secondary | ICD-10-CM | POA: Diagnosis not present

## 2015-09-27 DIAGNOSIS — Z8701 Personal history of pneumonia (recurrent): Secondary | ICD-10-CM | POA: Diagnosis not present

## 2015-09-27 DIAGNOSIS — Z7982 Long term (current) use of aspirin: Secondary | ICD-10-CM | POA: Insufficient documentation

## 2015-09-27 DIAGNOSIS — Z9221 Personal history of antineoplastic chemotherapy: Secondary | ICD-10-CM | POA: Diagnosis not present

## 2015-09-27 DIAGNOSIS — C8590 Non-Hodgkin lymphoma, unspecified, unspecified site: Secondary | ICD-10-CM

## 2015-09-27 DIAGNOSIS — Z8572 Personal history of non-Hodgkin lymphomas: Secondary | ICD-10-CM | POA: Diagnosis not present

## 2015-09-27 DIAGNOSIS — R5383 Other fatigue: Secondary | ICD-10-CM | POA: Insufficient documentation

## 2015-09-27 DIAGNOSIS — J45909 Unspecified asthma, uncomplicated: Secondary | ICD-10-CM | POA: Insufficient documentation

## 2015-09-27 DIAGNOSIS — R531 Weakness: Secondary | ICD-10-CM

## 2015-09-27 DIAGNOSIS — Z9484 Stem cells transplant status: Secondary | ICD-10-CM | POA: Insufficient documentation

## 2015-09-27 DIAGNOSIS — Z862 Personal history of diseases of the blood and blood-forming organs and certain disorders involving the immune mechanism: Secondary | ICD-10-CM | POA: Diagnosis not present

## 2015-09-27 DIAGNOSIS — C8330 Diffuse large B-cell lymphoma, unspecified site: Secondary | ICD-10-CM

## 2015-09-27 LAB — COMPREHENSIVE METABOLIC PANEL
ALBUMIN: 4.1 g/dL (ref 3.5–5.0)
ALT: 12 U/L — ABNORMAL LOW (ref 14–54)
ANION GAP: 4 — AB (ref 5–15)
AST: 14 U/L — ABNORMAL LOW (ref 15–41)
Alkaline Phosphatase: 72 U/L (ref 38–126)
BILIRUBIN TOTAL: 0.5 mg/dL (ref 0.3–1.2)
BUN: 32 mg/dL — ABNORMAL HIGH (ref 6–20)
CO2: 28 mmol/L (ref 22–32)
Calcium: 9.2 mg/dL (ref 8.9–10.3)
Chloride: 103 mmol/L (ref 101–111)
Creatinine, Ser: 1.15 mg/dL — ABNORMAL HIGH (ref 0.44–1.00)
GFR, EST AFRICAN AMERICAN: 54 mL/min — AB (ref >60)
GFR, EST NON AFRICAN AMERICAN: 47 mL/min — AB (ref >60)
GLUCOSE: 101 mg/dL — AB (ref 65–99)
POTASSIUM: 3.6 mmol/L (ref 3.5–5.1)
Sodium: 135 mmol/L (ref 135–145)
TOTAL PROTEIN: 7.1 g/dL (ref 6.5–8.1)

## 2015-09-27 LAB — CBC WITH DIFFERENTIAL/PLATELET
BASOS PCT: 1 %
Basophils Absolute: 0.1 10*3/uL (ref 0–0.1)
Eosinophils Absolute: 0.7 10*3/uL (ref 0–0.7)
Eosinophils Relative: 13 %
HEMATOCRIT: 38.3 % (ref 35.0–47.0)
Hemoglobin: 13.1 g/dL (ref 12.0–16.0)
Lymphocytes Relative: 29 %
Lymphs Abs: 1.5 10*3/uL (ref 1.0–3.6)
MCH: 34.7 pg — ABNORMAL HIGH (ref 26.0–34.0)
MCHC: 34.2 g/dL (ref 32.0–36.0)
MCV: 101.3 fL — ABNORMAL HIGH (ref 80.0–100.0)
MONO ABS: 0.5 10*3/uL (ref 0.2–0.9)
MONOS PCT: 10 %
NEUTROS ABS: 2.4 10*3/uL (ref 1.4–6.5)
Neutrophils Relative %: 47 %
Platelets: 154 10*3/uL (ref 150–440)
RBC: 3.78 MIL/uL — ABNORMAL LOW (ref 3.80–5.20)
RDW: 13.8 % (ref 11.5–14.5)
WBC: 5.1 10*3/uL (ref 3.6–11.0)

## 2015-09-27 LAB — TSH: TSH: 2.78 u[IU]/mL (ref 0.350–4.500)

## 2015-09-27 LAB — LACTATE DEHYDROGENASE: LDH: 138 U/L (ref 98–192)

## 2015-09-27 NOTE — Progress Notes (Signed)
[place] @ [facility] Telephone:(336) [phone]  Fax:(336) [place]: June 20, 1946  MR#: [id number]  ONG#:[id number]  Patient Care Team: Madelyn Brunner, MD as PCP - General (Unknown Physician Specialty) Madelyn Brunner, MD as Referring Physician (Unknown Physician Specialty) Forest Gleason, MD (Unknown Physician Specialty) Christene Lye, MD (General Surgery)  CHIEF COMPLAINT:  Chief Complaint  Patient presents with  . Lymphoma   Oncology History   1.  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  Status post 6 cycles of chemotherapy with CHOP9nd involved field radiation treatment 18,00 rads to the mantle field  and mediasinal boost 2340. finished radiation and chemotherapy in November of 2000 residual mediastinotomy mass 2 cm 2.  In May of 2001 progressive disease with mediastinal mass becoming bigger 8 cm3.  The patient underwent 2 cycles of chemotherapy withRituxan, and ICE.(June of 2001) [facility]and high-dose chemotherapy with  BEAM finished treatment in October of 2001. patient had significant pancytopenia as well as fever lasting for 3 months after transplant. Recurrent sinus infection and pulmonary infection 3.  Patient  underwent EUS biopsy of which was negative .  (November, 2012) 4.  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  Came today for the follow-up.  No chills.  No fever.  Patient had a PET scan done patient also had a recurrent pulmonary infection with multiple admission in the hospital.  During last admission patient  Patient came as an acute add-on.  Started complaining low-grade fever and increasing cough yellowish expectoration.  Patient is on intermittent antibiotic therapy presently on doxycycline. Patient is here for ongoing evaluation and treatment consideration.  Since last evaluation patient did not have any significant chills fever.  Taking antibiotics.  Patient has an appointment to see pulmonologist need for bronchoscopy being discussed.. Intermittent dry hacking cough.  No chest pain no hemoptysis.  No fever.  .[name] REVIEW OF SYSTEMS:   No status: Patient is feeling weak and tired.  No chills.  No fever. Lungs: On oxygen.  Shortness of breath on exertion.  Low-grade fever.  Increasing cough and yellowish expectoration.  No hemoptysis.  Cardiac: No chest pain. Gastro intestinal system: Patient complains of mild nausea with doxycycline No heartburn.  No nausea or vomiting.  No abdominal pain.  No diarrhea.  No constipation.  No rectal bleeding. Neurological system: No dizziness.  No tingling.  His was.  no tingling numbness.  no focal weakness or any focal signs. Skin:  No evidence of ecchymosis or rash. Lower extremity no edema Skin no skeletal system no bony pain. Is and did not have any pulmonary infection in last 4 weeks.  Had dizziness with Levaquin but started taking in the night and did not have any problem. As per HPI. Otherwise, a complete review of systems is negatve.  PAST MEDICAL HISTORY: Past Medical History  Diagnosis Date  . Non Hodgkin's lymphoma ([place])   .  Asthma   . Acute respiratory failure ([place]) 11/29/2014  . 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 Nasal sinus surgery  08/07/05 09/09/05    x 2   . Video bronchoscopy Bilateral 11/30/2014    Procedure: VIDEO BRONCHOSCOPY WITHOUT FLUORO;  Surgeon: Flora Lipps, MD;  Location: [facility] ORS;  Service: Cardiopulmonary;  Laterality: Bilateral;    FAMILY HISTORY Family History  Problem Relation Age of Onset  . Asthma Son     ADVANCED DIRECTIVES:  Patient does have advanced health care directive HEALTH MAINTENANCE: Social History  Substance Use Topics  . Smoking status: Never Smoker   . Smokeless tobacco: Never Used  . Alcohol Use: Yes      Allergies  Allergen Reactions  . Hydrocodone-Homatropine Itching  . Tussionex Pennkinetic Er [Hydrocod Polst-Cpm Polst Er] Itching  . Ciprofloxacin Rash    Current Outpatient Prescriptions  Medication Sig Dispense Refill  . albuterol (PROVENTIL HFA;VENTOLIN HFA) 108 (90 BASE) MCG/ACT inhaler Inhale 1 puff into the lungs every 6 (six) hours as needed for wheezing or shortness of breath.    [name] aspirin EC 81 MG tablet Take 81 mg by mouth daily.    . citalopram (CELEXA) 20 MG tablet Take 1 tablet (20 mg total) by mouth daily. 30 tablet 3  . doxycycline (VIBRA-TABS) 100 MG tablet Take 1 tablet (100 mg total) by mouth 2 (two) times daily. For 15 days. 30 tablet 3  . fluticasone-salmeterol (ADVAIR HFA) 115-21 MCG/ACT inhaler Inhale 2 puffs into the lungs 2 (two) times daily. 1 Inhaler 12  . levofloxacin (LEVAQUIN) 500 MG tablet Take 1 tablet (500 mg total) by mouth daily. Start taking after doxycycline for 7 days then take 1 week off. 7 tablet 3  . levothyroxine (SYNTHROID, LEVOTHROID) 25 MCG tablet Take 1 tablet (25 mcg total) by mouth daily. 30 tablet 0  . lidocaine-prilocaine (EMLA) cream Apply 1 application topically as needed. 30 g 3  .  omeprazole (PRILOSEC) 20 MG capsule Take 1 capsule (20 mg total) by mouth 2 (two) times daily before a meal. 60 capsule 3  . ondansetron (ZOFRAN) 4 MG tablet Take 1 tablet (4 mg total) by mouth every 8 (eight) hours as needed for nausea or vomiting. 30 tablet 1  . potassium chloride SA (K-DUR,KLOR-CON) 20 MEQ tablet Take 1 tablet (20 mEq total) by mouth 2 (two) times daily. 30 tablet 0   No current facility-administered medications for this visit.    OBJECTIVE:  Filed Vitals:   09/27/15 1535  BP: 121/83  Pulse: 89  Temp: 95.6 F (35.3 C)  Resp: 18     Body mass index is 22.23 kg/(m^2).    ECOG FS:2 - Symptomatic, <50% confined to bed  PHYSICAL EXAM: Gen. Status: Patient is alert and oriented on oxygen. In  Wheelchair Head exam was generally normal. There was no scleral icterus or corneal arcus. Mucous membranes were moist. Abdomen: Soft liver and spleen not palpable  no ascites Lungs: Emphysematous chest.  Bilateral rhonchi.  Right more than left Diminished and entry. Examination of the skin revealed no evidence of significant rashes, suspicious appearing nevi or other concerning lesions. Neurologically, the patient was awake, alert, and oriented to person, place and time. There were no obvious focal neurologic abnormalities. Cardiac exam revealed the PMI to be normally situated and sized. The rhythm was regular and no extrasystoles were noted during several minutes of auscultation. The first and second heart sounds were normal and physiologic splitting of the second heart sound was noted. There were no murmurs, rubs, clicks, or gallops. Lymphatic system: No palpable supraclavicular or cervical adenopathy   LAB RESULTS:  Appointment on 09/27/2015  Component Date Value Ref Range Status  . WBC 09/27/2015 5.1  3.6 - 11.0 K/uL Final  . RBC 09/27/2015 3.78* 3.80 - 5.20 MIL/uL Final  . Hemoglobin 09/27/2015 13.1  12.0 - 16.0 g/dL Final  . HCT 09/27/2015 38.3  35.0 - 47.0 % Final  . MCV  09/27/2015 101.3* 80.0 - 100.0 fL Final  . MCH 09/27/2015 34.7* 26.0 - 34.0 pg Final  . MCHC 09/27/2015 34.2  32.0 - 36.0 g/dL Final  . RDW 09/27/2015 13.8  11.5 - 14.5 % Final  . Platelets 09/27/2015 154  150 - 440 K/uL Final  . Neutrophils Relative % 09/27/2015 47   Final  . Neutro Abs 09/27/2015 2.4  1.4 - 6.5 K/uL Final  . Lymphocytes Relative 09/27/2015 29   Final  . Lymphs Abs 09/27/2015 1.5  1.0 - 3.6 K/uL Final  . Monocytes Relative 09/27/2015 10   Final  . Monocytes Absolute 09/27/2015 0.5  0.2 - 0.9 K/uL Final  . Eosinophils Relative 09/27/2015 13   Final  . Eosinophils Absolute 09/27/2015 0.7  0 - 0.7 K/uL Final  . Basophils Relative 09/27/2015 1   Final  . Basophils Absolute 09/27/2015 0.1  0 - 0.1 K/uL Final  . Sodium 09/27/2015 135  135 - 145 mmol/L Final  . Potassium 09/27/2015 3.6  3.5 - 5.1 mmol/L Final  . Chloride 09/27/2015 103  101 - 111 mmol/L Final  . CO2 09/27/2015 28  22 - 32 mmol/L Final  . Glucose, Bld 09/27/2015 101* 65 - 99 mg/dL Final  . BUN 09/27/2015 32* 6 - 20 mg/dL Final  . Creatinine, Ser 09/27/2015 1.15* 0.44 - 1.00 mg/dL Final  . Calcium 09/27/2015 9.2  8.9 - 10.3 mg/dL Final  . Total Protein 09/27/2015 7.1  6.5 - 8.1 g/dL Final  . Albumin 09/27/2015 4.1  3.5 - 5.0 g/dL Final  . AST 09/27/2015 14* 15 - 41 U/L Final  . ALT 09/27/2015 12* 14 - 54 U/L Final  . Alkaline Phosphatase 09/27/2015 72  38 - 126 U/L Final  . Total Bilirubin 09/27/2015 0.5  0.3 - 1.2 mg/dL Final  . GFR calc non Af Amer 09/27/2015 47* >60 mL/min Final  . GFR calc Af Amer 09/27/2015 54* >60 mL/min Final   Comment: (NOTE) The eGFR has been calculated using the CKD EPI equation. This calculation has not been validated in all clinical situations. eGFR's persistently <60 mL/min signify possible Chronic Kidney Disease.   . Anion gap 09/27/2015 4* 5 - 15 Final  . LDH 09/27/2015 138  98 - 192 U/L Final   T4 is 5.6, TSH is 2.78, T3 is 92 (March, 2017)    ASSESSMENT: Diffuse  loss B-cell lymphoma recurrent disease status post chemotherapy .  On clinical examination there is no evidence of recurrent  disease.  All lab data has been reviewed.  There are within acceptable range. Recurrent pulmonary infection Extensive pulmonary infiltrates secondary  TO   Radiation At this point in time continue alternating antibiotics. Thyroid functions are within normal limit continue same dose of Synthroid Patient has an appointment to see pulmonologist Dr. Mortimer Fries.  Reevaluate patient in 3 months or before if spikes any fever.    No matching staging information was found for the patient.  Forest Gleason, MD   09/27/2015 4:01 PM

## 2015-09-28 ENCOUNTER — Ambulatory Visit: Payer: Medicare Other | Admitting: Oncology

## 2015-09-28 ENCOUNTER — Other Ambulatory Visit: Payer: Medicare Other

## 2015-09-28 LAB — T4: T4, Total: 5.6 ug/dL (ref 4.5–12.0)

## 2015-09-28 LAB — T3: T3, Total: 92 ng/dL (ref 71–180)

## 2015-10-01 ENCOUNTER — Encounter: Payer: Self-pay | Admitting: Oncology

## 2015-10-03 ENCOUNTER — Other Ambulatory Visit: Payer: Self-pay | Admitting: *Deleted

## 2015-10-03 MED ORDER — CITALOPRAM HYDROBROMIDE 20 MG PO TABS: 20.0000 mg | ORAL_TABLET | Freq: Every day | ORAL | Status: DC

## 2015-10-09 ENCOUNTER — Encounter: Payer: Self-pay | Admitting: Internal Medicine

## 2015-10-09 ENCOUNTER — Ambulatory Visit (INDEPENDENT_AMBULATORY_CARE_PROVIDER_SITE_OTHER): Payer: Medicare Other | Admitting: Internal Medicine

## 2015-10-09 ENCOUNTER — Telehealth: Payer: Self-pay | Admitting: *Deleted

## 2015-10-09 VITALS — BP 124/68 | HR 91 | Ht 63.0 in | Wt 123.2 lb

## 2015-10-09 DIAGNOSIS — J189 Pneumonia, unspecified organism: Secondary | ICD-10-CM | POA: Diagnosis not present

## 2015-10-09 MED ORDER — CITALOPRAM HYDROBROMIDE 20 MG PO TABS: 20.0000 mg | ORAL_TABLET | Freq: Every day | ORAL | Status: DC

## 2015-10-09 NOTE — Patient Instructions (Signed)
Pneumonitis  Pneumonitis is inflammation of the lungs.   CAUSES   Many things can cause pneumonitis. These can include:    A bacterial or viral infection. Pneumonitis due to an infection is usually called pneumonia.   Work-related exposures, including farm and industrial work. Some substances that can cause pneumonitis include asbestos, silica, inhaled acids, or inhaled chlorine gas.    Repeated exposure to bird feathers, bird feces, or other allergens.    Medicine such as chemotherapy drugs, certain antibiotics, and some heart medicines.    Radiation therapy.    Exposure to mold. A hot tub, sauna, or home humidifier can have mold growing in it, even if it looks clean. The mold can be breathed in through water vapor.   Breathing (aspirating) stomach contents, food, or liquids into the lungs.   SIGNS AND SYMPTOMS    Cough.    Shortness of breath or difficulty breathing.    Fever.    Decreased energy.    Decreased appetite.   DIAGNOSIS   To diagnose pneumonitis, your health care provider will do a complete history and physical exam. Various tests may be ordered, such as:    Pulmonary function test.    Chest X-ray.    CT scan of the lungs.    Bronchoscopy.    Lung biopsy.   TREATMENT   Treatment will depend on the cause of the pneumonitis. If the cause is exposure to a substance, avoiding further exposure to that substance will help reduce your symptoms. Possible medical treatments for pneumonitis include:    Corticosteroid medicine to help decrease inflammation in the lungs.    Antibiotic medicine to help fight a bacterial lung infection.    Oxygen therapy if you are having difficulty breathing.   HOME CARE INSTRUCTIONS    Avoid exposure to any substance identified as the cause of your pneumonitis.    If you must continue to work with substances that can cause pneumonitis, wear a mask to protect your lungs.    Only take over-the-counter or prescription medicine as directed by  your health care provider.    Do not smoke.    If you use inhalers, keep them with you at all times.    Follow up with your health care provider as directed.   SEEK IMMEDIATE MEDICAL CARE IF:    You develop new or increased shortness of breath.    You develop a blue color (cyanosis) under your fingernails.    You have a fever.   MAKE SURE YOU:    Understand these instructions.   Will watch your condition.   Will get help right away if you are not doing well or get worse.     This information is not intended to replace advice given to you by your health care provider. Make sure you discuss any questions you have with your health care provider.     Document Released: 12/26/2009 Document Revised: 03/10/2013 Document Reviewed: 12/28/2012  Elsevier Interactive Patient Education 2016 [employer]

## 2015-10-09 NOTE — Progress Notes (Signed)
[facility] Consultation    Date: 10/09/2015  MRN# [id number] Anna Mcbride Sep 30, 1945  Referring Physician: Dr. Jeb Levering PMD - Dr. Gilford Rile [facility]) Anna Mcbride is a 70 y.o. old female seen in consultation for recurrent pulmonary infection  CC:  Chief Complaint  Patient presents with  . Follow-up    pt. states breathing has improved. occ SOB, prod. cough yellow in color. denies wheezign or chest pain/tightness. 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  CT chest 09/05/15 shows stable RUL infiltrate  At today's visit she is accompanied by her husband.     Review of Chart ONCOLOGY History 1. 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 Status post 6 cycles of chemotherapy with CHOP9nd involved field radiation treatment 18,00 rads to the mantle field and mediasinal boost 2340. finished radiation and chemotherapy in November of 2000 residual mediastinotomy mass 2 cm 2. In May of 2001 progressive disease with mediastinal mass becoming bigger 8 cm3. The patient underwent 2 cycles of chemotherapy withRituxan, and ICE.(June of 2001) Followed by 53more cycles at [facility] with DHAP.and high-dose chemotherapy with BEAM finished treatment in October of 2001. patient had significant pancytopenia as well as fever lasting for 3 months after transplant. Recurrent sinus infection and pulmonary infection 3. Patient underwent EUS biopsy of which was negative . (November,  2012) 4. 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  HENT: Negative for congestion.   Respiratory: Positive for cough and shortness of breath. Negative for sputum production and wheezing.   Cardiovascular: Negative for chest pain, palpitations and orthopnea.  Gastrointestinal: Negative for heartburn, nausea, vomiting and abdominal pain.  Neurological: Negative for dizziness.  Psychiatric/Behavioral: Negative for depression.  All other systems reviewed and are negative.    Physical Examination:   BP 124/68 mmHg  Pulse 91  Ht 5\' 3"  (1.6 m)  Wt 123 lb 3.2 oz (55.883 kg)  BMI 21.83 kg/m2  SpO2 99%  General Appearance: No distress  Neuro:without focal findings, mental status, speech normal, alert and oriented, cranial nerves 2-12 intact, reflexes normal and symmetric, sensation grossly normal  HEENT: PERRLA, EOM intact, no ptosis, no other lesions noticed, right ear canal with no sig erythema, could not visualize the TM due to cerumen impaction ; Mallampati 2 Pulmonary: normal breath sounds., diaphragmatic excursion normal.No wheezing, No rales;   Sputum Production:   CardiovascularNormal S1,S2.  No m/r/g.  Abdominal aorta pulsation normal.    Skin:   warm, no rashes, no ecchymosis  Extremities: normal, no cyanosis, clubbing, no edema, warm with normal capillary refill. Other findings:none   (  The following images and results were reviewed by Dr. Stevenson Clinch on 10/09/2015).  CXR 2 View - 07/04/15 EXAM: CHEST 2  VIEW  COMPARISON: CT 11/29/2014. Chest x-ray 11/29/2014, 09/28/2014.  FINDINGS: Port-A-Cath noted in stable position. Heart size normal. Right apical pleural parenchymal thickening noted consistent with scarring, radiation chain. Bilateral and basilar interstitial changes noted consistent with scarring. Heart size normal. No pneumothorax. No acute bony abnormality.  IMPRESSION: Bilateral diffuse changes of pleural parenchymal scarring, radiation change, particularly prominent in the upper lobes. No acute abnormality.   CT Chest 11/29/2014 CLINICAL DATA: Recurrent large cell lymphoma presenting with shortness of breath, cough, and fever  EXAM: CT ANGIOGRAPHY CHEST WITH CONTRAST  TECHNIQUE: Multidetector CT imaging of the chest was performed using the standard protocol during bolus administration of intravenous contrast. Multiplanar CT image reconstructions and MIPs were obtained to evaluate the vascular anatomy.  CONTRAST: 82mL OMNIPAQUE IOHEXOL 350 MG/ML SOLN  COMPARISON: Chest CT April 18, 2014 and chest radiograph Nov 29, 2014  FINDINGS: There is no demonstrable pulmonary embolus. There is no thoracic aortic aneurysm or dissection.  There is underlying centrilobular emphysematous change. There is loculated effusion on the right with consolidation arising in the right perihilar region with extension into the right upper lobe. Some of this change is felt to be of post radiation therapy etiology. There is moderate volume loss throughout this area. There is patchy airspace consolidation at multiple sites in the right lower lobe, characterized by several ill-defined semi-solid opacities. There is also patchy atelectasis in the lingula, primarily inferiorly. There is focal volume loss in the left perihilar region which is stable and felt to be primarily of radiation therapy etiology. There is mild lower lobe bronchiectatic change bilaterally.  Thyroid  appears unremarkable. There is no apparent adenopathy. The pericardium is not thickened.  In the visualized upper abdomen, there is atherosclerotic change in aorta. There is hepatic steatosis.  There are no blastic or lytic bone lesions. Port-A-Cath tip is in the superior vena cava.  Review of the MIP images confirms the above findings.  IMPRESSION: No demonstrable pulmonary embolus.  Post radiation therapy change in both perihilar regions. Areas of patchy ground-glass type opacity in the right lower lobe, likely representing pneumonia. Atypical presentation of neoplasm is possible; a followup study in 6 day weeks may be advisable to further assess this region.  There is underlying emphysema. There is mild lower lobe bronchiectatic change.  No adenopathy.  Moderate loculated effusion on the right.  Hepatic steatosis.  Port-A-Cath tip in superior vena cava.   CT chest 09/05/15  IMPRESSION: 1. Perihilar consolidation which extends into the RIGHT upper lobe with chronic bronchiectasis is most consistent with radiation change. 2. Ill-defined tissue planes in the mediastinum without clear evidence of nodal metastasis. 3. Some improvement in nodular airspace disease in the RIGHT lower lobe suggests improving chronic infectious process. 4. No convincing evidence of lung cancer recurrence.     Assessment and Plan: Recurrent upper respiratory infection (URI) Has a history of diffuse large B-cell lymphoma, chronic infiltrates suspected to be from radiation pneumonitis, now with recurrent upper story tract infections currently being treated with alternating doses of antibiotics (doxycycline alternating with Levaquin). At this time, she is advised to continue with her current antibiotic regiment.   Plan: -Continue with current antibiotic regimen -Continue with Advair -Continue supplemental oxygen as needed -will follow up and assess resp status, Ct chest  reviewed-RLL opacities improved -will order CT scan CXR as needed   Cough Multifactorial: History  of radiation pneumonitis, recurrent upper story tract infection, deconditioning Continue with diet and exercise as tolerated, continue with incentive spirometry.   Follow up in 2 months  I have personally obtained a history, examined the patient, evaluated Pertinent laboratory and RadioGraphic/imaging results, and  formulated the assessment and plan   The Patient requires high complexity decision making for assessment and support, frequent evaluation and titration of therapies.  Patient/Family are satisfied with Plan of action and management. All questions answered  [name], M.D.  [facility]  Medical Director [place] Director [facility] Cardio-Pulmonary Department

## 2015-10-09 NOTE — Telephone Encounter (Signed)
Refill for citalopram sent to pharmacy

## 2015-10-24 ENCOUNTER — Other Ambulatory Visit: Payer: Self-pay | Admitting: *Deleted

## 2015-10-24 MED ORDER — LEVOTHYROXINE SODIUM 25 MCG PO TABS: 25.0000 ug | ORAL_TABLET | Freq: Every day | ORAL | Status: DC

## 2015-10-27 ENCOUNTER — Inpatient Hospital Stay: Payer: Medicare Other | Attending: Oncology

## 2015-10-27 DIAGNOSIS — C8590 Non-Hodgkin lymphoma, unspecified, unspecified site: Secondary | ICD-10-CM | POA: Diagnosis not present

## 2015-10-27 DIAGNOSIS — Z452 Encounter for adjustment and management of vascular access device: Secondary | ICD-10-CM | POA: Diagnosis not present

## 2015-10-27 DIAGNOSIS — C801 Malignant (primary) neoplasm, unspecified: Secondary | ICD-10-CM

## 2015-10-27 MED ORDER — SODIUM CHLORIDE 0.9% FLUSH
10.0000 mL | INTRAVENOUS | Status: DC | PRN
  Administered 2015-10-27: 10 mL via INTRAVENOUS
  Filled 2015-10-27: qty 10

## 2015-10-27 MED ORDER — HEPARIN SOD (PORK) LOCK FLUSH 100 UNIT/ML IV SOLN
500.0000 [IU] | Freq: Once | INTRAVENOUS | Status: AC
  Administered 2015-10-27: 500 [IU] via INTRAVENOUS
  Filled 2015-10-27: qty 5

## 2015-11-07 ENCOUNTER — Other Ambulatory Visit: Payer: Self-pay | Admitting: *Deleted

## 2015-11-07 MED ORDER — POTASSIUM CHLORIDE CRYS ER 20 MEQ PO TBCR: 20.0000 meq | EXTENDED_RELEASE_TABLET | Freq: Two times a day (BID) | ORAL | Status: DC

## 2015-11-28 ENCOUNTER — Other Ambulatory Visit: Payer: Self-pay | Admitting: *Deleted

## 2015-11-28 MED ORDER — LEVOTHYROXINE SODIUM 25 MCG PO TABS: 25.0000 ug | ORAL_TABLET | Freq: Every day | ORAL | Status: DC

## 2015-11-30 ENCOUNTER — Ambulatory Visit (INDEPENDENT_AMBULATORY_CARE_PROVIDER_SITE_OTHER): Payer: Medicare Other | Admitting: Internal Medicine

## 2015-11-30 ENCOUNTER — Encounter: Payer: Self-pay | Admitting: Internal Medicine

## 2015-11-30 VITALS — BP 130/70 | HR 91 | Ht 63.0 in | Wt 125.0 lb

## 2015-11-30 DIAGNOSIS — J209 Acute bronchitis, unspecified: Secondary | ICD-10-CM

## 2015-11-30 MED ORDER — GUAIFENESIN-CODEINE 100-10 MG/5ML PO SOLN: 5.0000 mL | ORAL | Status: DC | PRN

## 2015-11-30 MED ORDER — PREDNISONE 50 MG PO TABS: 20.0000 mg | ORAL_TABLET | Freq: Every day | ORAL | Status: DC

## 2015-11-30 NOTE — Progress Notes (Signed)
[facility] Consultation    Date: 11/30/2015  MRN# [id number] [name] 27-May-1946  Referring Physician: Dr. Jeb Levering PMD - Dr. Gilford Rile [facility]) Anna Mcbride is a 70 y.o. old female seen in consultation for recurrent pulmonary infection  CC:  Chief Complaint  Patient presents with  . 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  CT chest 09/05/15 shows stable RUL infiltrate  At today's visit she is accompanied by her husband.     Review of Chart ONCOLOGY History 1. 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 Status post 6 cycles of chemotherapy with CHOP9nd involved field radiation treatment 18,00 rads to the mantle field and mediasinal boost 2340. finished radiation and chemotherapy in November of 2000 residual mediastinotomy mass 2 cm 2. In May of 2001 progressive disease with mediastinal mass becoming bigger 8 cm3. The patient underwent 2 cycles of chemotherapy withRituxan, and ICE.(June of 2001) Followed by 69more cycles at [facility] with DHAP.and high-dose chemotherapy with BEAM finished treatment in October of 2001. patient had significant pancytopenia as well as fever lasting for 3 months after transplant. Recurrent sinus infection and pulmonary infection 3. Patient underwent EUS biopsy of which was negative . (November, 2012) 4. 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  HENT: Negative for congestion.   Respiratory: Positive for cough, shortness of breath and wheezing. Negative for sputum production.   Cardiovascular: Negative for chest pain, palpitations and orthopnea.  Gastrointestinal: Negative for heartburn, nausea, vomiting and abdominal pain.  Neurological: Negative for dizziness.  Psychiatric/Behavioral: Negative for depression.  All other systems reviewed and are negative.    Physical Examination:   BP 130/70 mmHg  Pulse 91  Ht 5\' 3"  (1.6 m)  Wt 125 lb (56.7 kg)  BMI 22.15 kg/m2  SpO2 97%  General Appearance: No distress  Neuro:without focal findings, mental status, speech normal, alert and oriented, cranial nerves 2-12 intact, reflexes normal and symmetric, sensation grossly normal  HEENT: PERRLA, EOM intact, no ptosis, no other lesions noticed, right ear canal with no sig erythema, could not visualize the TM due to cerumen impaction ; Mallampati 2 Pulmonary: normal breath sounds., diaphragmatic excursion normal.No wheezing, No rales;   Sputum Production:   CardiovascularNormal S1,S2.  No m/r/g.  Abdominal aorta pulsation normal.    Skin:   warm, no rashes, no ecchymosis  Extremities: normal, no cyanosis, clubbing, no edema, warm with normal capillary refill. Other findings:none   CT chest 09/05/15  IMPRESSION: 1. Perihilar consolidation which extends into the RIGHT upper lobe with chronic bronchiectasis is most consistent with radiation change. 2. Ill-defined tissue planes in  the mediastinum without clear evidence of nodal metastasis. 3. Some improvement in nodular airspace disease in the  RIGHT lower lobe suggests improving chronic infectious process. 4. No convincing evidence of lung cancer recurrence.     Assessment and Plan: Recurrent upper respiratory infection (URI) Has a history of diffuse large B-cell lymphoma, chronic infiltrates suspected to be from radiation pneumonitis, now with recurrent upper story tract infections currently being treated with alternating doses of antibiotics (doxycycline alternating with Levaquin). At this time, she is advised to continue with her current antibiotic regiment.   Plan: -Continue with current antibiotic regimen -Continue with Advair -Continue supplemental oxygen as needed   Cough with wheezing(acute bronchitis) -will start prednisone 50 mg daily for 10 days -robitussin with codeine as needed   Follow up in 1 months    The Patient requires high complexity decision making for assessment and support, frequent evaluation and titration of therapies.  Patient/Family are satisfied with Plan of action and management. All questions answered  [name], M.D.  [facility]  Medical Director [place] Director [facility] Cardio-Pulmonary Department

## 2015-11-30 NOTE — Patient Instructions (Signed)

## 2015-12-11 ENCOUNTER — Inpatient Hospital Stay: Payer: Medicare Other | Attending: Oncology

## 2015-12-11 DIAGNOSIS — Z8572 Personal history of non-Hodgkin lymphomas: Secondary | ICD-10-CM | POA: Diagnosis not present

## 2015-12-11 DIAGNOSIS — Z452 Encounter for adjustment and management of vascular access device: Secondary | ICD-10-CM | POA: Insufficient documentation

## 2015-12-11 DIAGNOSIS — C801 Malignant (primary) neoplasm, unspecified: Secondary | ICD-10-CM

## 2015-12-11 MED ORDER — SODIUM CHLORIDE 0.9% FLUSH
10.0000 mL | Freq: Once | INTRAVENOUS | Status: AC
  Administered 2015-12-11: 10 mL via INTRAVENOUS
  Filled 2015-12-11: qty 10

## 2015-12-11 MED ORDER — HEPARIN SOD (PORK) LOCK FLUSH 100 UNIT/ML IV SOLN
500.0000 [IU] | Freq: Once | INTRAVENOUS | Status: AC
  Administered 2015-12-11: 500 [IU] via INTRAVENOUS
  Filled 2015-12-11: qty 5

## 2015-12-21 ENCOUNTER — Inpatient Hospital Stay: Payer: Medicare Other | Attending: Oncology

## 2015-12-21 ENCOUNTER — Inpatient Hospital Stay (HOSPITAL_BASED_OUTPATIENT_CLINIC_OR_DEPARTMENT_OTHER): Payer: Medicare Other | Admitting: Oncology

## 2015-12-21 ENCOUNTER — Encounter: Payer: Self-pay | Admitting: Oncology

## 2015-12-21 VITALS — BP 119/74 | HR 80 | Temp 96.9°F | Resp 18 | Wt 124.4 lb

## 2015-12-21 DIAGNOSIS — J45909 Unspecified asthma, uncomplicated: Secondary | ICD-10-CM | POA: Diagnosis not present

## 2015-12-21 DIAGNOSIS — Z9981 Dependence on supplemental oxygen: Secondary | ICD-10-CM | POA: Diagnosis not present

## 2015-12-21 DIAGNOSIS — Z79899 Other long term (current) drug therapy: Secondary | ICD-10-CM

## 2015-12-21 DIAGNOSIS — Z9221 Personal history of antineoplastic chemotherapy: Secondary | ICD-10-CM

## 2015-12-21 DIAGNOSIS — J9600 Acute respiratory failure, unspecified whether with hypoxia or hypercapnia: Secondary | ICD-10-CM | POA: Insufficient documentation

## 2015-12-21 DIAGNOSIS — Z9484 Stem cells transplant status: Secondary | ICD-10-CM | POA: Insufficient documentation

## 2015-12-21 DIAGNOSIS — Z7982 Long term (current) use of aspirin: Secondary | ICD-10-CM | POA: Diagnosis not present

## 2015-12-21 DIAGNOSIS — Z923 Personal history of irradiation: Secondary | ICD-10-CM | POA: Insufficient documentation

## 2015-12-21 DIAGNOSIS — C8590 Non-Hodgkin lymphoma, unspecified, unspecified site: Secondary | ICD-10-CM

## 2015-12-21 DIAGNOSIS — G629 Polyneuropathy, unspecified: Secondary | ICD-10-CM | POA: Insufficient documentation

## 2015-12-21 DIAGNOSIS — R0602 Shortness of breath: Secondary | ICD-10-CM

## 2015-12-21 DIAGNOSIS — D630 Anemia in neoplastic disease: Secondary | ICD-10-CM | POA: Diagnosis not present

## 2015-12-21 DIAGNOSIS — R5383 Other fatigue: Secondary | ICD-10-CM

## 2015-12-21 DIAGNOSIS — C8332 Diffuse large B-cell lymphoma, intrathoracic lymph nodes: Secondary | ICD-10-CM

## 2015-12-21 DIAGNOSIS — R918 Other nonspecific abnormal finding of lung field: Secondary | ICD-10-CM

## 2015-12-21 DIAGNOSIS — R531 Weakness: Secondary | ICD-10-CM | POA: Insufficient documentation

## 2015-12-21 DIAGNOSIS — Z8572 Personal history of non-Hodgkin lymphomas: Secondary | ICD-10-CM | POA: Insufficient documentation

## 2015-12-21 LAB — CBC WITH DIFFERENTIAL/PLATELET
BASOS ABS: 0.1 10*3/uL (ref 0–0.1)
Basophils Relative: 1 %
EOS PCT: 11 %
Eosinophils Absolute: 0.6 10*3/uL (ref 0–0.7)
HCT: 39.2 % (ref 35.0–47.0)
HEMOGLOBIN: 13.4 g/dL (ref 12.0–16.0)
LYMPHS ABS: 1.1 10*3/uL (ref 1.0–3.6)
LYMPHS PCT: 18 %
MCH: 34.3 pg — AB (ref 26.0–34.0)
MCHC: 34.1 g/dL (ref 32.0–36.0)
MCV: 100.5 fL — AB (ref 80.0–100.0)
Monocytes Absolute: 0.4 10*3/uL (ref 0.2–0.9)
Monocytes Relative: 8 %
NEUTROS PCT: 62 %
Neutro Abs: 3.7 10*3/uL (ref 1.4–6.5)
PLATELETS: 167 10*3/uL (ref 150–440)
RBC: 3.9 MIL/uL (ref 3.80–5.20)
RDW: 13.7 % (ref 11.5–14.5)
WBC: 5.9 10*3/uL (ref 3.6–11.0)

## 2015-12-21 LAB — LACTATE DEHYDROGENASE: LDH: 140 U/L (ref 98–192)

## 2015-12-21 LAB — COMPREHENSIVE METABOLIC PANEL
ALK PHOS: 74 U/L (ref 38–126)
ALT: 14 U/L (ref 14–54)
AST: 14 U/L — AB (ref 15–41)
Albumin: 3.8 g/dL (ref 3.5–5.0)
Anion gap: 5 (ref 5–15)
BUN: 30 mg/dL — AB (ref 6–20)
CALCIUM: 9 mg/dL (ref 8.9–10.3)
CHLORIDE: 106 mmol/L (ref 101–111)
CO2: 26 mmol/L (ref 22–32)
CREATININE: 1.14 mg/dL — AB (ref 0.44–1.00)
GFR, EST AFRICAN AMERICAN: 54 mL/min — AB (ref >60)
GFR, EST NON AFRICAN AMERICAN: 47 mL/min — AB (ref >60)
Glucose, Bld: 107 mg/dL — ABNORMAL HIGH (ref 65–99)
Potassium: 3.7 mmol/L (ref 3.5–5.1)
SODIUM: 137 mmol/L (ref 135–145)
Total Bilirubin: 0.9 mg/dL (ref 0.3–1.2)
Total Protein: 6.9 g/dL (ref 6.5–8.1)

## 2015-12-21 NOTE — Progress Notes (Signed)
[place] @ [facility] [phone]  Fax:([phone] [place]: Jul 13, 1946  MR#: [id number]  WPY#:[id number]  Patient Care Team: Madelyn Brunner, MD as PCP - General (Unknown Physician Specialty) Madelyn Brunner, MD as Referring Physician (Unknown Physician Specialty) Forest Gleason, MD (Unknown Physician Specialty) Christene Lye, MD (General Surgery)  CHIEF COMPLAINT:  Chief Complaint  Patient presents with  . Lymphoma   Oncology History   1.  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  Status post 6 cycles of chemotherapy with CHOP9nd involved field radiation treatment 18,00 rads to the mantle field  and mediasinal boost 2340. finished radiation and chemotherapy in November of 2000 residual mediastinotomy mass 2 cm 2.  In May of 2001 progressive disease with mediastinal mass becoming bigger 8 cm3.  The patient underwent 2 cycles of chemotherapy withRituxan, and ICE.(June of 2001) [facility]and high-dose chemotherapy with  BEAM finished treatment in October of 2001. patient had significant pancytopenia as well as fever lasting for 3 months after transplant. Recurrent sinus infection and pulmonary infection 3.  Patient  underwent EUS biopsy of which was negative .  (November, 2012) 4.  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  Started on RICE october 2014 6.ppatient is now being considered for high-dose chemotherapy and stem cell support (March, 2015) 7.2nd stem cell transplant cannot be done because of the  no stem cell to harvest   8.recurrent pulmonary infection (2016) chronic antibiotic therapy 9.Chronic antibiotics had been discontinued from December 21, 2015      INTERVAL HISTORY:   70year old lady with recurrent diffuse large cell lymphoma.  Came today for the follow-up.  No  chills.  No fever.  Patient had a PET scan done patient also had a recurrent pulmonary infection with multiple admission in the hospital.     Patient recently had cough and yellowish expectoration was evaluated by pulmonologist was started on now prednisone.  Patient complains of some progressing neuropathy and worried about being related to Levaquin.  Patient is here for ongoing evaluation feeling much better. The patient also has some questions about port  A cath   .  REVIEW OF SYSTEMS:   No status: Patient is feeling weak and tired.  No chills.  No fever. Lungs: On oxygen.  Shortness of breath on exertion.  Low-grade fever.  Increasing cough and yellowish expectoration.  No hemoptysis.  Cardiac: No chest pain. Gastro intestinal system: Patient complains of mild nausea with doxycycline No heartburn.  No nausea or vomiting.  No abdominal pain.  No diarrhea.  No constipation.  No rectal bleeding. Neurological system: No dizziness.  No tingling.  His was.  no tingling numbness.  no focal weakness or any focal signs. Skin: No evidence of ecchymosis or rash. Lower extremity no edema Skin no skeletal system no bony pain. Is and did not have any pulmonary infection in last 4 weeks.  Had dizziness with Levaquin but started taking in the night and did not have any problem. As per HPI. Otherwise, a complete review of systems is negatve.  PAST MEDICAL HISTORY: Past Medical History  Diagnosis Date  . Non Hodgkin's lymphoma ([place]   . Asthma   . Acute respiratory failure ([place] 11/29/2014  . 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 Nasal sinus surgery  08/07/05 09/09/05    x 2   . Video bronchoscopy Bilateral 11/30/2014    Procedure: VIDEO BRONCHOSCOPY WITHOUT FLUORO;  Surgeon: Erin Fulling, MD;  Location: [facility] ORS;  Service: Cardiopulmonary;  Laterality: Bilateral;    FAMILY  HISTORY Family History  Problem Relation Age of Onset  . Asthma Son     ADVANCED DIRECTIVES:  Patient does have advanced health care directive HEALTH MAINTENANCE: Social History  Substance Use Topics  . Smoking status: Never Smoker   . Smokeless tobacco: Never Used  . Alcohol Use: Yes      Allergies  Allergen Reactions  . Hydrocodone-Homatropine Itching  . Tussionex Pennkinetic Er [Hydrocod Polst-Cpm Polst Er] Itching  . Ciprofloxacin Rash    Current Outpatient Prescriptions  Medication Sig Dispense Refill  . albuterol (PROVENTIL HFA;VENTOLIN HFA) 108 (90 BASE) MCG/ACT inhaler Inhale 1 puff into the lungs every 6 (six) hours as needed for wheezing or shortness of breath.    [name] aspirin EC 81 MG tablet Take 81 mg by mouth daily.    . citalopram (CELEXA) 20 MG tablet Take 1 tablet (20 mg total) by mouth daily. 30 tablet 3  . fluticasone-salmeterol (ADVAIR HFA) 115-21 MCG/ACT inhaler Inhale 2 puffs into the lungs 2 (two) times daily. 1 Inhaler 12  . guaiFENesin-codeine 100-10 MG/5ML syrup Take 5 mLs by mouth every 4 (four) hours as needed for cough. 120 mL 0  . levothyroxine (SYNTHROID, LEVOTHROID) 25 MCG tablet Take 1 tablet (25 mcg total) by mouth daily. 30 tablet 3  . lidocaine-prilocaine (EMLA) cream Apply 1 application topically as needed. 30 g 3  . omeprazole (PRILOSEC) 20 MG capsule Take 1 capsule (20 mg total) by mouth 2 (two) times daily before a meal. 60 capsule 3  . ondansetron (ZOFRAN) 4 MG tablet Take 1 tablet (4 mg total) by mouth every 8 (eight) hours as needed for nausea or vomiting. 30 tablet 1  . oxyCODONE (OXY IR/ROXICODONE) 5 MG immediate release tablet Take by mouth.    . potassium chloride SA (K-DUR,KLOR-CON) 20 MEQ tablet Take 1 tablet (20 mEq total) by mouth 2 (two) times daily. 30 tablet 0  . predniSONE (DELTASONE) 50 MG tablet Take 0.5 tablets (25 mg total) by mouth daily. 10 tablet 1   No current facility-administered medications for this visit.     OBJECTIVE:  Filed Vitals:   12/21/15 1035  BP: 119/74  Pulse: 80  Temp: 96.9 F (36.1 C)  Resp: 18     Body mass index is 22.04 kg/(m^2).    ECOG FS:2 - Symptomatic, <50% confined to bed  PHYSICAL EXAM: Gen. Status: Patient is alert and oriented on oxygen. In  Wheelchair Head exam was generally normal. There was no scleral icterus or corneal arcus. Mucous membranes were moist. Abdomen: Soft liver and spleen not palpable no ascites Lungs: Emphysematous chest.  Bilateral rhonchi.  Right more than left Diminished and entry. Examination of the skin revealed no evidence of significant rashes, suspicious appearing nevi or other concerning lesions. Neurologically, the patient was awake, alert, and oriented to person, place and time. There were no obvious focal neurologic abnormalities. Cardiac exam revealed the PMI to be normally situated and sized. The rhythm was regular and no extrasystoles were noted during several minutes of auscultation. The first and second heart sounds were normal and physiologic splitting of the second heart sound was  noted. There were no murmurs, rubs, clicks, or gallops. Lymphatic system: No palpable supraclavicular or cervical adenopathy   LAB RESULTS:  Appointment on 12/21/2015  Component Date Value Ref Range Status  . WBC 12/21/2015 5.9  3.6 - 11.0 K/uL Final  . RBC 12/21/2015 3.90  3.80 - 5.20 MIL/uL Final  . Hemoglobin 12/21/2015 13.4  12.0 - 16.0 g/dL Final  . HCT 12/21/2015 39.2  35.0 - 47.0 % Final  . MCV 12/21/2015 100.5* 80.0 - 100.0 fL Final  . MCH 12/21/2015 34.3* 26.0 - 34.0 pg Final  . MCHC 12/21/2015 34.1  32.0 - 36.0 g/dL Final  . RDW 12/21/2015 13.7  11.5 - 14.5 % Final  . Platelets 12/21/2015 167  150 - 440 K/uL Final  . Neutrophils Relative % 12/21/2015 62   Final  . Neutro Abs 12/21/2015 3.7  1.4 - 6.5 K/uL Final  . Lymphocytes Relative 12/21/2015 18   Final  . Lymphs Abs 12/21/2015 1.1  1.0 - 3.6 K/uL Final  . Monocytes Relative  12/21/2015 8   Final  . Monocytes Absolute 12/21/2015 0.4  0.2 - 0.9 K/uL Final  . Eosinophils Relative 12/21/2015 11   Final  . Eosinophils Absolute 12/21/2015 0.6  0 - 0.7 K/uL Final  . Basophils Relative 12/21/2015 1   Final  . Basophils Absolute 12/21/2015 0.1  0 - 0.1 K/uL Final  . Sodium 12/21/2015 137  135 - 145 mmol/L Final  . Potassium 12/21/2015 3.7  3.5 - 5.1 mmol/L Final  . Chloride 12/21/2015 106  101 - 111 mmol/L Final  . CO2 12/21/2015 26  22 - 32 mmol/L Final  . Glucose, Bld 12/21/2015 107* 65 - 99 mg/dL Final  . BUN 12/21/2015 30* 6 - 20 mg/dL Final  . Creatinine, Ser 12/21/2015 1.14* 0.44 - 1.00 mg/dL Final  . Calcium 12/21/2015 9.0  8.9 - 10.3 mg/dL Final  . Total Protein 12/21/2015 6.9  6.5 - 8.1 g/dL Final  . Albumin 12/21/2015 3.8  3.5 - 5.0 g/dL Final  . AST 12/21/2015 14* 15 - 41 U/L Final  . ALT 12/21/2015 14  14 - 54 U/L Final  . Alkaline Phosphatase 12/21/2015 74  38 - 126 U/L Final  . Total Bilirubin 12/21/2015 0.9  0.3 - 1.2 mg/dL Final  . GFR calc non Af Amer 12/21/2015 47* >60 mL/min Final  . GFR calc Af Amer 12/21/2015 54* >60 mL/min Final   Comment: (NOTE) The eGFR has been calculated using the CKD EPI equation. This calculation has not been validated in all clinical situations. eGFR's persistently <60 mL/min signify possible Chronic Kidney Disease.   . Anion gap 12/21/2015 5  5 - 15 Final  . LDH 12/21/2015 140  98 - 192 U/L Final   T4 is 5.6, TSH is 2.78, T3 is 92 (March, 2017)    ASSESSMENT: Diffuse loss B-cell lymphoma recurrent disease status post chemotherapy .  On clinical examination there is no evidence of recurrent disease.  All lab data has been reviewed.  There are within acceptable range. Recurrent pulmonary infection Extensive pulmonary infiltrates secondary  TO   Radiation Patient is taken off antibiotic therapy as did not have any significant pulmonary infection requiring hospitalization in almost last 1 year.  On clinical  ground there is no evidence of recurrent or progressive disease  Repeat chest x-ray in 3 months..    Patient was advised to call me if has any acute infection or fever  Repeat PET scan in 1 year time or  before if patient becomes symptomatic T4 , TSH is being checked with just the dose of Synthroid during next visit No matching staging information was found for the patient.  Forest Gleason, MD   12/21/2015 11:07 AM

## 2016-01-02 ENCOUNTER — Ambulatory Visit (INDEPENDENT_AMBULATORY_CARE_PROVIDER_SITE_OTHER): Payer: Medicare Other | Admitting: Internal Medicine

## 2016-01-02 ENCOUNTER — Encounter: Payer: Self-pay | Admitting: Internal Medicine

## 2016-01-02 VITALS — BP 118/70 | HR 89 | Ht 63.0 in | Wt 125.0 lb

## 2016-01-02 DIAGNOSIS — J9611 Chronic respiratory failure with hypoxia: Secondary | ICD-10-CM

## 2016-01-02 NOTE — Progress Notes (Signed)
[place] [facility] Consultation    Date: 01/02/2016  MRN# [id number] MANASI WIEDMANN 1945-09-01  Referring Physician: Dr. Jeb Levering PMD - Dr. Gilford Rile [facility]) ASHANTEE BURNELL is a 70 y.o. old female seen in consultation for recurrent pulmonary infection  CC:  Chief Complaint  Patient presents with  . Follow-up    SOB better, no cough, chest tightness or CP    HPI:   has chronic cough and chornic SOB, doing well overall since last visit, responded well to oral prednisone therapy  ABX stopped last month  Patient had previous recurrent respiratory tract infections requiring alternating doses of doxycycline and Levaquin    patient 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  CT chest 09/05/15 shows stable RUL infiltrate  No signs infection at this time, doing well on oxygen     Review of Chart ONCOLOGY History 1. 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 Status post 6 cycles of chemotherapy with CHOP9nd involved field radiation treatment 18,00 rads to the mantle field and mediasinal boost 2340. finished radiation and chemotherapy in November of 2000 residual mediastinotomy mass 2 cm 2. In May of 2001 progressive disease with mediastinal mass becoming bigger 8 cm3. The patient underwent 2 cycles of chemotherapy withRituxan, and ICE.(June of 2001) Followed by 85more cycles at [facility] with DHAP.and high-dose chemotherapy with BEAM finished treatment in October of 2001. patient had significant pancytopenia as well as fever lasting for 3 months after transplant. Recurrent sinus infection and pulmonary infection 3. Patient underwent EUS biopsy of which was negative . (November, 2012) 4. Recurrent diffuse large cell lymphoma from  the right side of the neck lymph node biopsy done in October of 2014 5. 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  HENT: Negative for congestion.   Respiratory: Positive for cough and shortness of breath. Negative for sputum production and wheezing.   Cardiovascular: Negative for chest pain, palpitations and orthopnea.  Gastrointestinal: Negative for heartburn, nausea, vomiting and abdominal pain.  Neurological: Negative for dizziness.  Psychiatric/Behavioral: Negative for depression.  All other systems reviewed and are negative.    Physical Examination:   BP 118/70 mmHg  Pulse 89  Ht 5\' 3"  (1.6 m)  Wt 125 lb (56.7 kg)  BMI 22.15 kg/m2  SpO2 96%  General Appearance: No distress  Neuro:without focal findings, mental status, speech normal, alert and oriented, cranial nerves 2-12 intact, reflexes normal and symmetric, sensation grossly normal  HEENT: PERRLA, EOM intact, no ptosis, no other lesions noticed, right ear canal with no sig erythema, could not visualize the TM due to cerumen impaction ; Mallampati 2 Pulmonary: normal breath sounds., diaphragmatic excursion normal.No wheezing, No rales;   Sputum Production:   CardiovascularNormal S1,S2.  No m/r/g.  Abdominal aorta pulsation normal.    Skin:   warm, no rashes, no ecchymosis  Extremities: normal, no cyanosis, clubbing, no edema, warm with normal capillary refill. Other findings:none   CT chest 09/05/15  IMPRESSION: 1.  Perihilar consolidation which extends into the RIGHT upper lobe with chronic bronchiectasis is most consistent with radiation change. 2. Ill-defined tissue  planes in the mediastinum without clear evidence of nodal metastasis. 3. Some improvement in nodular airspace disease in the RIGHT lower lobe suggests improving chronic infectious process. 4. No convincing evidence of lung cancer recurrence.     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  Patient/Family are satisfied with Plan of action and management. All questions answered  [name], M.D.  [facility]  Medical Director [place] Director [facility] Cardio-Pulmonary Department

## 2016-01-20 DIAGNOSIS — M791 Myalgia, unspecified site: Secondary | ICD-10-CM

## 2016-01-20 DIAGNOSIS — K210 Gastro-esophageal reflux disease with esophagitis: Secondary | ICD-10-CM

## 2016-01-20 DIAGNOSIS — I10 Essential (primary) hypertension: Secondary | ICD-10-CM

## 2016-01-20 DIAGNOSIS — J3089 Other allergic rhinitis: Principal | ICD-10-CM

## 2016-01-22 ENCOUNTER — Telehealth: Payer: Self-pay | Admitting: *Deleted

## 2016-01-22 ENCOUNTER — Other Ambulatory Visit: Payer: Self-pay | Admitting: Internal Medicine

## 2016-01-22 DIAGNOSIS — R059 Cough, unspecified: Secondary | ICD-10-CM

## 2016-01-22 DIAGNOSIS — R05 Cough: Secondary | ICD-10-CM

## 2016-01-22 MED ORDER — RANITIDINE HCL 150 MG PO TABS
1 refills | Status: CP
Start: 2016-01-22 — End: 2016-03-26

## 2016-01-22 MED ORDER — HYDROCHLOROTHIAZIDE 25 MG PO TABS
2 refills | Status: CP
Start: 2016-01-22 — End: 2016-04-05

## 2016-01-22 MED ORDER — FLUTICASONE PROPIONATE 50 MCG/ACT NA SUSP
1 refills | Status: CP
Start: 2016-01-22 — End: 2016-05-03

## 2016-01-22 MED ORDER — TIZANIDINE HCL 2 MG PO TABS
1 refills | Status: CP
Start: 2016-01-22 — End: 2016-03-26

## 2016-01-22 MED ORDER — ESOMEPRAZOLE MAGNESIUM 40 MG PO CPDR
1 refills | Status: CP
Start: 2016-01-22 — End: 2016-03-26

## 2016-01-22 MED ORDER — CEPHALEXIN 500 MG PO CAPS: 500.0000 mg | ORAL_CAPSULE | Freq: Three times a day (TID) | ORAL | Status: DC

## 2016-01-22 NOTE — Telephone Encounter (Signed)
Contacted patient per md order-notified patient that her keflex order was sent to her [facility].

## 2016-01-22 NOTE — Progress Notes (Signed)
Spoke to pt's husband- coughing/fever 100/ productive sputum- recommend kelfex 500 TID x 7days.   Heather- please talk to pt re; her symptoms/ and tell her that the script has been sent to her pharmacy- Thx

## 2016-01-25 ENCOUNTER — Other Ambulatory Visit: Payer: Self-pay

## 2016-01-25 MED ORDER — POTASSIUM CHLORIDE CRYS ER 20 MEQ PO TBCR: 20.0000 meq | EXTENDED_RELEASE_TABLET | Freq: Two times a day (BID) | ORAL | Status: DC

## 2016-01-26 ENCOUNTER — Ambulatory Visit: Attending: Internal Medicine | Primary: Internal Medicine

## 2016-01-26 DIAGNOSIS — L0390 Cellulitis, unspecified: Secondary | ICD-10-CM

## 2016-01-26 DIAGNOSIS — E785 Hyperlipidemia, unspecified: Secondary | ICD-10-CM

## 2016-01-26 DIAGNOSIS — K219 Gastro-esophageal reflux disease without esophagitis: Secondary | ICD-10-CM

## 2016-01-26 DIAGNOSIS — Z7189 Other specified counseling: Secondary | ICD-10-CM

## 2016-01-26 DIAGNOSIS — Z794 Long term (current) use of insulin: Secondary | ICD-10-CM

## 2016-01-26 DIAGNOSIS — M1990 Unspecified osteoarthritis, unspecified site: Secondary | ICD-10-CM

## 2016-01-26 DIAGNOSIS — I10 Essential (primary) hypertension: Secondary | ICD-10-CM

## 2016-01-26 DIAGNOSIS — F419 Anxiety disorder, unspecified: Secondary | ICD-10-CM

## 2016-01-26 DIAGNOSIS — F329 Major depressive disorder, single episode, unspecified: Secondary | ICD-10-CM

## 2016-01-26 DIAGNOSIS — Z6841 Body Mass Index (BMI) 40.0 and over, adult: Principal | ICD-10-CM

## 2016-01-26 DIAGNOSIS — E1142 Type 2 diabetes mellitus with diabetic polyneuropathy: Secondary | ICD-10-CM

## 2016-01-26 DIAGNOSIS — E119 Type 2 diabetes mellitus without complications: Principal | ICD-10-CM

## 2016-01-26 DIAGNOSIS — M7989 Other specified soft tissue disorders: Secondary | ICD-10-CM

## 2016-01-26 DIAGNOSIS — N390 Urinary tract infection, site not specified: Secondary | ICD-10-CM

## 2016-01-26 DIAGNOSIS — E1165 Type 2 diabetes mellitus with hyperglycemia: Secondary | ICD-10-CM

## 2016-01-26 MED ORDER — DOXYCYCLINE MONOHYDRATE 100 MG PO CAPS
0 refills | Status: CP
Start: 2016-01-26 — End: 2016-05-03

## 2016-02-07 ENCOUNTER — Ambulatory Visit: Attending: Family | Primary: Internal Medicine

## 2016-02-07 DIAGNOSIS — E7849 Other hyperlipidemia: Secondary | ICD-10-CM

## 2016-02-07 DIAGNOSIS — E119 Type 2 diabetes mellitus without complications: Secondary | ICD-10-CM

## 2016-02-07 DIAGNOSIS — I10 Essential (primary) hypertension: Principal | ICD-10-CM

## 2016-02-07 DIAGNOSIS — Z1159 Encounter for screening for other viral diseases: Secondary | ICD-10-CM

## 2016-02-07 NOTE — Progress Notes
?   oxybutynin (DITROPAN) 5 MG Tablet TAKE TWO TABLETS BY MOUTHTWICE A DAY .[name].THANK YOU..   ? phenazopyridine (PYRIDIUM) 100 MG Tablet Take 1 tablet by mouth 3 times daily as needed for pain.   ? potassium chloride (KLOR-CON M) 20 MEQ tablet TAKE ONE TABLET THREE    TIMES A DAY .[name].THANK YOU..   ? pravastatin (PRAVACHOL) 40 MG tablet TAKE ONE TABLET AT       BEDTIME .[name].THANK YOU..   ? pravastatin (PRAVACHOL) 40 MG Tablet TAKE ONE TABLET AT       BEDTIME .[name].THANK YOU..   ? PROAIR HFA 108 (90 BASE) MCG/ACT Aerosol Solution INHALE 2 PUFFS (INTO     LUNGS) FOUR TIMES A DAY  .[name].THANK YOU..   ? raNITIdine (ZANTAC) 150 MG Tablet TAKE ONE TABLET TWICE A  DAY ##THANK YOU##   ? raNITIdine (ZANTAC) 150 MG Tablet TAKE ONE TABLET TWICE A  DAY ##THANK YOU##   ? tiZANidine (ZANAFLEX) 2 MG Tablet TAKE TWO TABLETS EVERY   EIGHT HOURS (ONLY WHEN   NEEDED) ##THANK YOU##   ? torsemide (DEMADEX) 20 MG Tablet TAKE ONE TABLET EVERY    MORNING ##THANK YOU##   ? VITAMIN D-2 50000 UNITS Capsule TAKE ONE CAPSULE BY MOUTHTWO TIMES PER WEEK FOR 6 WEEKS .[name].THANK YOU.[name].     No current facility-administered medications on file prior to visit.      Allergies   Allergen Reactions   ? Asa [Aspirin] Itching   ? Pcn [Penicillins] Itching   ? Sulfa Drugs Itching         Review of Systems  Review of Systems   Constitutional: Positive for fatigue. Negative for chills and fever.   HENT: Negative for congestion, postnasal drip, rhinorrhea and sinus pressure.    Eyes: Negative.    Respiratory: Negative.  Negative for cough and wheezing.    Cardiovascular: Positive for leg swelling. Negative for chest pain and palpitations.        Right leg   Gastrointestinal: Negative.  Negative for abdominal pain, constipation, diarrhea, nausea and vomiting.   Endocrine: Negative.    Genitourinary: Positive for dysuria and frequency.   Musculoskeletal: Positive for arthralgias and myalgias.   Skin: Positive for rash.        Both legs   Allergic/Immunologic: Negative.

## 2016-02-07 NOTE — Progress Notes
Subjective:   Delfina RedwoodMary Mcgourty is a 70 y.o. female being seen today for Follow-up (29mo) and Medications Refill       HPI  CO of bilateral leg redness and swelling in right leg for 1 week, no fever or chils. Insulin dependent DM type 2 , Patient isn't compliant with diet, exercise and medicine. Due for lab  Home sugars: flactuate. Last dilated eye exam approximately a few months ago. Patient is adherent to low salt diet and complient with current antihypertensive medicine. Blood pressure is well controlled at home.  Patient denies: chest pressure/discomfort, dyspnea, palpitations, irregular heart beats, near-syncope, syncope. Aricept is helping , want to continue. GOA associated with myalgia, using zanaflex which is helping.    Past Medical History:   Diagnosis Date   ? Anxiety    ? Arthritis    ? Depression    ? Diabetes mellitus    ? GERD (gastroesophageal reflux disease)    ? Hyperlipidemia    ? Hypertension      Past Surgical History:   Procedure Laterality Date   ? HYSTERECTOMY       Family History   Problem Relation Age of Onset   ? No Known Problems Mother    ? No Known Problems Father      Social History     Social History   ? Marital status: Single     Spouse name: N/A   ? Number of children: N/A   ? Years of education: N/A     Occupational History   ? Not on file.     Social History Main Topics   ? Smoking status: Never Smoker   ? Smokeless tobacco: Never Used   ? Alcohol use No   ? Drug use: No   ? Sexual activity: Not on file     Other Topics Concern   ? Not on file     Social History Narrative     Current Outpatient Prescriptions on File Prior to Visit   Medication Sig   ? ADVAIR DISKUS 500-50 MCG/DOSE diskus inhaler INHALE ONE DISKUS AS     DIRECTED TWICE A DAY     .[name].THANK YOU..   ? ALPRAZolam (XANAX) 0.5 MG tablet Take 1 Tablet by mouth 2 times daily.   ? ciprofloxacin-dexamethasone (CIPRODEX) 0.3-0.1 % Suspension Place 4 drops into the left ear 2 times daily.

## 2016-02-07 NOTE — Progress Notes
?   diclofenac (VOLTAREN GEL) 1 % Gel topical gel APPLY 4 GM TOPICALLY FOURTIMES A DAY **MEASURE    DOSE USING ENCLOSED DOSE CARD*   ? donepezil (ARICEPT) 5 MG Tablet TAKE ONE TABLET BY MOUTH AT BEDTIME .[name].THANK YOU..   ? doxycycline (VIBRA-TABS) 100 MG Tablet TAKE ONE TABLET TWICE A  DAY .[name].THANK YOU..   ? esomeprazole (NexIUM) 40 MG Capsule Delayed Release TAKE ONE CAPSULE EVERY   DAY .[name].THANK YOU..   ? fluticasone (FLONASE) 50 MCG/ACT Suspension SPRAY ONE PUFF INTO NOSE TWICE A DAY ==SHAKE      GENTLY==   ? fluticasone (FLONASE) 50 MCG/ACT Suspension SPRAY ONE PUFF INTO NOSE TWICE A DAY ==SHAKE      GENTLY==   ? gabapentin (NEURONTIN) 400 MG Capsule TAKE 2 CAPSULES BY MOUTH 3 TIMES DAILY.   ? GLOBAL INJECT EASE INSULIN SYR 31G X 5/16" 1 ML Miscellaneous USE ONE TWICE A DAY      .[name].THANK YOU..   ? HumaLOG Mix 75/25 vial INJECT 100 UNITS TWICE A DAY BEFORE BREAKFAST AND SUPPER --REFRIGERATE--   ? hydroCHLOROthiazide (HYDRODIURIL) 25 MG Tablet TAKE ONE TABLET EVERY DAYFOR FLUIDS .[name].THANK YOU..   ? hydroCHLOROthiazide (HYDRODIURIL) 25 MG Tablet TAKE ONE TABLET EVERY DAYFOR FLUIDS .[name].THANK YOU..   ? insulin aspart protamine-insulin aspart (NovoLOG MIX 70/30) (70-30) 100 UNIT/ML Suspension 40 units am and 28 unit pm   ? lisinopril (PRINIVIL,ZESTRIL) 10 MG Tablet TAKE ONE TABLET EVERY DAY.[name].THANK YOU..   ? meclizine (ANTIVERT) 25 MG Tablet TAKE ONE OR TWO TABLETS  EVERY EIGHT HOURS .[name].THANKYOU..   ? meloxicam (MOBIC) 15 MG tablet TAKE ONE TABLET DAILY    WITH A MEAL ##THANK YOU##   ? metFORMIN (GLUCOPHAGE) 500 MG tablet Take 1 Tablet by mouth 2 times daily (with meals). No results found for this basename: CREATININE   ? mupirocin (BACTROBAN) 2 % Ointment APPLY TO AFFECTED AREAS  TWICE A DAY ##THANK YOU##   ? MYRBETRIQ 25 MG Tablet Extended Release 24 Hour TAKE ONE TABLET BY MOUTH AT BEDTIME .[name].THANK YOU..   ? nystatin (MYCOSTATIN) 100000 UNIT/ML Suspension Take 5 mLs by mouth 4 times daily.

## 2016-02-07 NOTE — Progress Notes
Neurological: Negative.    Hematological: Negative.    Psychiatric/Behavioral: Positive for decreased concentration.         Objective:        VITAL SIGNS (all recorded)      Clinic Vitals       01/26/16 1345             Amb Encounter Vitals    Weight 110.7 kg (244 lb)    -TD at 01/26/16 1347       Height 1.626 m (5\' 4" )    -TD at 01/26/16 1347       BMI (Calculated) 41.97    -TD at 01/26/16 1347       BSA (Calculated - sq m) 2.24    -TD at 01/26/16 1347       BP 148/58    -TD at 01/26/16 1347       BP Location Left upper arm    -TD at 01/26/16 1347       Position Sitting    -TD at 01/26/16 1347       Pulse 80    -TD at 01/26/16 1347       Pulse Source Radial    -TD at 01/26/16 1347       Resp 18    -TD at 01/26/16 1347       Temp 36.9 ?C (98.4 ?F)    -TD at 01/26/16 1347       Temperature Source Tympanic    -TD at 01/26/16 1347       O2 Saturation 100 %    -TD at 01/26/16 1347       FiO2 Source RA    -TD at 01/26/16 1347       Pain Score Five    -TD at 01/26/16 1347       Location feet and legs    -TD at 01/26/16 1347       Education/Communication Barriers?    Learning/Communication Barriers? No    -TD at 01/26/16 1347       Fall Risk Assessment    Had recent fall / Last 6 months? No recent fall    -TD at 01/26/16 1347       Does patient have a fear of falling? No    -TD at 01/26/16 1347         User Key  (r) = Recorded By, (t) = Taken By, (c) = Cosigned By    Initials Name Effective Dates    TD Duffy, Tammy, MA 10/24/15 -         Physical Exam   Constitutional: She is oriented to person, place, and time. She appears well-developed and well-nourished.   HENT:   Head: Normocephalic and atraumatic.   Right Ear: External ear normal.   Left Ear: External ear normal.   Nose: Nose normal.   Mouth/Throat: Oropharynx is clear and moist.   Eyes: Conjunctivae and EOM are normal. Pupils are equal, round, and reactive to light.   Neck: Normal range of motion. Neck supple.

## 2016-02-07 NOTE — Progress Notes
Cardiovascular: Normal rate, regular rhythm, normal heart sounds and intact distal pulses.    Pulmonary/Chest: Effort normal and breath sounds normal.   Abdominal: Soft. Bowel sounds are normal. There is tenderness in the suprapubic area.   Musculoskeletal: Normal range of motion. She exhibits edema and tenderness.   Allover joints, 1+ edema right leg   Neurological: She is alert and oriented to person, place, and time. She has normal reflexes.   Skin: Skin is warm and dry. Rash noted. Rash is pustular.        Psychiatric: She has a normal mood and affect. Her behavior is normal. Judgment and thought content normal. Cognition and memory are impaired. She exhibits abnormal recent memory.   Nursing note and vitals reviewed.  Foot Exam: foot deformity Pes Planus bilaterally      Assessment:       ICD-10-CM ICD-9-CM    1. BMI 40.0-44.9, adult Z68.41 V85.41    2. Advanced directives, counseling/discussion Z71.89 V65.49    3. Cellulitis, unspecified cellulitis site L03.90 682.9     both legs   4. Right leg swelling M79.89 729.81 US Venous Doppler Lower Ext Right   5. Urinary tract infection, site unspecified N39.0 599.0 doxycycline (MONODOX) 100 MG Capsule   6. Uncontrolled type 2 diabetes mellitus with diabetic polyneuropathy, with long-term current use of insulin E11.42 250.62     Z79.4 357.2     E11.65 V58.67    7. Essential hypertension I10 401.9           Plan:   Continue current meds.  Diet and exercise as long patient can tolerate.  Salt restrictions.  FU with other MDs as scheduled.  Meds benefits and side effects discussed with patient. Patient sounds understanding.  Orders Placed This Encounter   Medications   ? doxycycline (MONODOX) 100 MG Capsule     Sig: TAKE ONE CAPSULE TWICE A DAY     Dispense:  20 capsule     Refill:  0     NEEDS REFILLS     Orders Placed This Encounter   Procedures   ? US Venous Doppler Lower Ext Right   Teaching re:  DM: untreated elevations of blood sugar can lead to problems

## 2016-02-07 NOTE — Progress Notes
with eyes, nerves, kidneys, heart, blood vessels, and integrity of feet.  Discussed pathophysiology of pancreas, insulin, insulin resistance, effect of obesity and smoking, concept of syndrome X  Importance of monitoring blood sugars and recording results is discussed at length.  Patient has materials for keeping records and for measuring.  Educational materials regarding diabetes is given to patient.   Patient is made aware of the importance of monitoring HgbA1c every three to six months, having a yearly dilated eye exam, checking feet regularly for damage to the skin, monitoring cholesterol, seeing an Opthalmologist yearly and maintaining a diet consistent with diabetes care.  Patient given educational material regarding diabetes.    Patient advised to avoid driving or operating machinery when she is under the influence of these controlled substances.    Health Maintenance was reviewed. The patient's HM Topic list was:                                            Health Maintenance   Topic Date Due   ? Preventive Wellness Visit  08/23/2016 (Originally 11/16/1963)   ? USPSTF Hepatitis C Screening  08/23/2016 (Originally 1946-04-04)   ? Colonoscopy  08/23/2025 (Originally 11/16/1993)   ? Influenza Vaccine (1) 03/22/2016   ? Hemoglobin A1C  04/26/2016   ? Diabetic Eye Exam  08/17/2016   ? Mammogram Discussion  10/16/2016   ? Diabetic Foot Exam  10/23/2016   ? Lipid Profile  10/25/2016   ? Urine Protein  10/25/2016   ? Creatinine  10/25/2016   ? Basic Metabolic Panel  10/25/2016   ? Dexa  04/18/2025   ? DTaP,Tdap,and Td Vaccines  Excluded   ? Pneumovax / Prevnar  Excluded   ? Zoster Vaccine  Excluded

## 2016-02-07 NOTE — Progress Notes
Reason:  Physician ordered labs  Amount:  1 marble, 1 lavender, urine Tubes  Type:  Butterfly  Site:  left Hand  Reaction:  None    Draw performed by:  [id number],  02/07/2016

## 2016-02-08 NOTE — Progress Notes
No teaching statement needed

## 2016-02-20 ENCOUNTER — Other Ambulatory Visit: Payer: Self-pay | Admitting: *Deleted

## 2016-02-20 DIAGNOSIS — C8200 Follicular lymphoma grade I, unspecified site: Secondary | ICD-10-CM

## 2016-02-20 DIAGNOSIS — J449 Chronic obstructive pulmonary disease, unspecified: Secondary | ICD-10-CM

## 2016-02-20 DIAGNOSIS — E785 Hyperlipidemia, unspecified: Principal | ICD-10-CM

## 2016-02-21 ENCOUNTER — Ambulatory Visit
Admission: RE | Admit: 2016-02-21 | Discharge: 2016-02-21 | Disposition: A | Discharge status: Home / Self Care | Payer: Medicare Other | Source: Ambulatory Visit | Attending: Oncology | Admitting: Oncology

## 2016-02-21 ENCOUNTER — Inpatient Hospital Stay: Payer: Medicare Other

## 2016-02-21 ENCOUNTER — Inpatient Hospital Stay: Payer: Medicare Other | Attending: Internal Medicine | Admitting: Internal Medicine

## 2016-02-21 DIAGNOSIS — Z79899 Other long term (current) drug therapy: Secondary | ICD-10-CM | POA: Insufficient documentation

## 2016-02-21 DIAGNOSIS — D6481 Anemia due to antineoplastic chemotherapy: Secondary | ICD-10-CM | POA: Diagnosis not present

## 2016-02-21 DIAGNOSIS — J45909 Unspecified asthma, uncomplicated: Secondary | ICD-10-CM | POA: Insufficient documentation

## 2016-02-21 DIAGNOSIS — C8332 Diffuse large B-cell lymphoma, intrathoracic lymph nodes: Secondary | ICD-10-CM | POA: Insufficient documentation

## 2016-02-21 DIAGNOSIS — C8330 Diffuse large B-cell lymphoma, unspecified site: Secondary | ICD-10-CM

## 2016-02-21 DIAGNOSIS — J9600 Acute respiratory failure, unspecified whether with hypoxia or hypercapnia: Secondary | ICD-10-CM | POA: Insufficient documentation

## 2016-02-21 DIAGNOSIS — C8331 Diffuse large B-cell lymphoma, lymph nodes of head, face, and neck: Secondary | ICD-10-CM | POA: Insufficient documentation

## 2016-02-21 DIAGNOSIS — Z9481 Bone marrow transplant status: Secondary | ICD-10-CM | POA: Insufficient documentation

## 2016-02-21 DIAGNOSIS — Z95828 Presence of other vascular implants and grafts: Secondary | ICD-10-CM

## 2016-02-21 DIAGNOSIS — Z452 Encounter for adjustment and management of vascular access device: Secondary | ICD-10-CM | POA: Insufficient documentation

## 2016-02-21 DIAGNOSIS — Z7982 Long term (current) use of aspirin: Secondary | ICD-10-CM

## 2016-02-21 DIAGNOSIS — Z923 Personal history of irradiation: Secondary | ICD-10-CM | POA: Insufficient documentation

## 2016-02-21 DIAGNOSIS — R0602 Shortness of breath: Secondary | ICD-10-CM | POA: Insufficient documentation

## 2016-02-21 DIAGNOSIS — C8200 Follicular lymphoma grade I, unspecified site: Secondary | ICD-10-CM

## 2016-02-21 LAB — COMPREHENSIVE METABOLIC PANEL
ALBUMIN: 4.3 g/dL (ref 3.5–5.0)
ALT: 12 U/L — ABNORMAL LOW (ref 14–54)
ANION GAP: 7 (ref 5–15)
AST: 14 U/L — ABNORMAL LOW (ref 15–41)
Alkaline Phosphatase: 65 U/L (ref 38–126)
BUN: 34 mg/dL — ABNORMAL HIGH (ref 6–20)
CO2: 25 mmol/L (ref 22–32)
Calcium: 9 mg/dL (ref 8.9–10.3)
Chloride: 106 mmol/L (ref 101–111)
Creatinine, Ser: 1.12 mg/dL — ABNORMAL HIGH (ref 0.44–1.00)
GFR calc non Af Amer: 48 mL/min — ABNORMAL LOW (ref >60)
GFR, EST AFRICAN AMERICAN: 55 mL/min — AB (ref >60)
GLUCOSE: 96 mg/dL (ref 65–99)
POTASSIUM: 3.6 mmol/L (ref 3.5–5.1)
SODIUM: 138 mmol/L (ref 135–145)
Total Bilirubin: 0.8 mg/dL (ref 0.3–1.2)
Total Protein: 6.9 g/dL (ref 6.5–8.1)

## 2016-02-21 LAB — CBC WITH DIFFERENTIAL/PLATELET
BASOS PCT: 1 %
Basophils Absolute: 0.1 10*3/uL (ref 0–0.1)
EOS ABS: 0.2 10*3/uL (ref 0–0.7)
EOS PCT: 4 %
HCT: 38.9 % (ref 35.0–47.0)
Hemoglobin: 13.3 g/dL (ref 12.0–16.0)
Lymphocytes Relative: 28 %
Lymphs Abs: 1.3 10*3/uL (ref 1.0–3.6)
MCH: 34.4 pg — ABNORMAL HIGH (ref 26.0–34.0)
MCHC: 34.3 g/dL (ref 32.0–36.0)
MCV: 100.3 fL — ABNORMAL HIGH (ref 80.0–100.0)
MONO ABS: 0.5 10*3/uL (ref 0.2–0.9)
MONOS PCT: 10 %
NEUTROS PCT: 57 %
Neutro Abs: 2.8 10*3/uL (ref 1.4–6.5)
PLATELETS: 136 10*3/uL — AB (ref 150–440)
RBC: 3.88 MIL/uL (ref 3.80–5.20)
RDW: 14 % (ref 11.5–14.5)
WBC: 4.9 10*3/uL (ref 3.6–11.0)

## 2016-02-21 LAB — LACTATE DEHYDROGENASE: LDH: 134 U/L (ref 98–192)

## 2016-02-21 MED ORDER — PRAVASTATIN SODIUM 40 MG PO TABS
0 refills | Status: CP
Start: 2016-02-21 — End: 2016-03-25

## 2016-02-21 MED ORDER — PROAIR HFA 108 (90 BASE) MCG/ACT IN AERS
0 refills | Status: CP
Start: 2016-02-21 — End: 2016-05-03

## 2016-02-21 MED ORDER — HEPARIN SOD (PORK) LOCK FLUSH 100 UNIT/ML IV SOLN
500.0000 [IU] | Freq: Once | INTRAVENOUS | Status: AC
  Administered 2016-02-21: 500 [IU] via INTRAVENOUS

## 2016-02-21 MED ORDER — SODIUM CHLORIDE 0.9% FLUSH
10.0000 mL | INTRAVENOUS | Status: DC | PRN
  Administered 2016-02-21: 10 mL via INTRAVENOUS
  Filled 2016-02-21: qty 10

## 2016-02-21 NOTE — Assessment & Plan Note (Addendum)
#   DLBCL-status post relapse; status post autologous stem cell transplant.  last treatment in 2015; PET May 2016- NED. Clinically no evidence of recurrence  # Chronic respiratory failure on oxygen; multiple lung infections currently none. Chest x-ray reviewed- chronic scarring no active infiltrates.  # Thrombocytopenia- 136/ new- repeat in 1 month  # Labs/ MD/ 3 months.   # 25 minutes face-to-face with the patient discussing the above plan of care; more than 50% of time spent on prognosis/ natural history; counseling and coordination.

## 2016-02-21 NOTE — Progress Notes (Signed)
[place] OFFICE PROGRESS NOTE  Patient Care Team: Madelyn Brunner, MD as PCP - General (Unknown Physician Specialty) Madelyn Brunner, MD as Referring Physician (Unknown Physician Specialty) Forest Gleason, MD (Unknown Physician Specialty) Christene Lye, MD (General Surgery)  No matching staging information was found for the patient.   Oncology History   1.  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  Status post 6 cycles of chemotherapy with CHOP9nd involved field radiation treatment 18,00 rads to the mantle field  and mediasinal boost 2340. finished radiation and chemotherapy in November of 2000 residual mediastinotomy mass 2 cm 2.  In May of 2001 progressive disease with mediastinal mass becoming bigger 8 cm3.  The patient underwent 2 cycles of chemotherapy withRituxan, and ICE.(June of 2001) Followed by 54more cycles  at [facility] with DHAP.and high-dose chemotherapy with  BEAM finished treatment in October of 2001. patient had significant pancytopenia as well as fever lasting for 3 months after transplant. Recurrent sinus infection and pulmonary infection 3.  Patient  underwent EUS biopsy of which was negative .  (November, 2012) 4.  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  Started on RICE october 2014 6.ppatient is now being considered for high-dose chemotherapy and stem cell support (March, 2015) 7.2nd stem cell transplant cannot be done because of the  no stem cell to harvest     Lymphoma, non-Hodgkin's ([place])   06/30/2013 Initial Diagnosis    Lymphoma, non-Hodgkin's     This is my first interaction with the patient as patient's primary oncologist has been Dr.Choksi. I reviewed the patient's prior charts/pertinent labs/imaging in detail; findings are summarized above.    INTERVAL HISTORY:  Anna Mcbride 70 y.o.  female  pleasant patient above history of Relapsed diffuse large B-cell lymphoma status post cell transplant; most currently in remission- is here for follow-up.  Patient had seen a pulmonologist morning- had a chest x-ray. Had chronic mild shortness of breath not any worse. Continues to be on oxygen.  No new lumps or bumps. No nausea or vomiting. No significant weight loss.  REVIEW OF SYSTEMS:  A complete 10 point review of system is done which is negative except mentioned above/history of present illness.   PAST MEDICAL HISTORY :  Past Medical History:  Diagnosis Date  . Acute respiratory failure ([place]) 11/29/2014  . 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 Asthma   . Non Hodgkin's lymphoma ([place])     PAST SURGICAL HISTORY :   Past Surgical History:  Procedure Laterality Date  . 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 NASAL SINUS SURGERY  08/07/05 09/09/05   x 2   . VIDEO BRONCHOSCOPY Bilateral 11/30/2014   Procedure: VIDEO BRONCHOSCOPY WITHOUT FLUORO;  Surgeon: Flora Lipps, MD;  Location: [facility] ORS;  Service: Cardiopulmonary;  Laterality: Bilateral;    FAMILY HISTORY :   Family History  Problem Relation Age of Onset  . Asthma Son     SOCIAL HISTORY:   Social History  Substance Use Topics  . Smoking status: Never Smoker  . Smokeless tobacco: Never Used  . Alcohol use Yes    ALLERGIES:  is allergic to hydrocodone-homatropine; tussionex pennkinetic er [hydrocod polst-cpm polst er]; and ciprofloxacin.  MEDICATIONS:  Current Outpatient Prescriptions  Medication Sig Dispense Refill  . albuterol (PROVENTIL HFA;VENTOLIN HFA) 108 (90 BASE) MCG/ACT  inhaler Inhale 1 puff into the lungs every 6 (six) hours as needed for wheezing or shortness of breath.    [name] aspirin EC 81 MG tablet Take 81 mg by mouth daily.    . cephALEXin (KEFLEX) 500 MG capsule Take 1 capsule (500 mg total) by mouth 3 (three) times daily. 21 capsule 0  . citalopram (CELEXA) 20 MG tablet Take 1 tablet (20 mg total) by mouth  daily. 30 tablet 3  . fluticasone-salmeterol (ADVAIR HFA) 115-21 MCG/ACT inhaler Inhale 2 puffs into the lungs 2 (two) times daily. 1 Inhaler 12  . guaiFENesin-codeine 100-10 MG/5ML syrup Take 5 mLs by mouth every 4 (four) hours as needed for cough. 120 mL 0  . levothyroxine (SYNTHROID, LEVOTHROID) 25 MCG tablet Take 1 tablet (25 mcg total) by mouth daily. 30 tablet 3  . lidocaine-prilocaine (EMLA) cream Apply 1 application topically as needed. 30 g 3  . omeprazole (PRILOSEC) 20 MG capsule Take 1 capsule (20 mg total) by mouth 2 (two) times daily before a meal. 60 capsule 3  . ondansetron (ZOFRAN) 4 MG tablet Take 1 tablet (4 mg total) by mouth every 8 (eight) hours as needed for nausea or vomiting. 30 tablet 1  . potassium chloride SA (K-DUR,KLOR-CON) 20 MEQ tablet Take 1 tablet (20 mEq total) by mouth 2 (two) times daily. 30 tablet 0   No current facility-administered medications for this visit.     PHYSICAL EXAMINATION: ECOG PERFORMANCE STATUS: 1 - Symptomatic but completely ambulatory  BP 131/85 (BP Location: Left Arm, Patient Position: Sitting)   Pulse 80   Temp 97.5 F (36.4 C) (Tympanic)   Resp 18   Wt 125 lb 2 oz (56.8 kg)   BMI 22.16 kg/m   Filed Weights   02/21/16 1126  Weight: 125 lb 2 oz (56.8 kg)    GENERAL: Thin built moderately nourished on nasal cannula of oxygen; Alert, no distress and comfortable.   Accompanied by husband. EYES: no pallor or icterus OROPHARYNX: no thrush or ulceration; good dentition  NECK: supple, no masses felt LYMPH:  no palpable lymphadenopathy in the cervical, axillary or inguinal regions LUNGS: Bilateral coarse breath sounds to auscultation and  No wheeze or crackles HEART/CVS: regular rate & rhythm and no murmurs; No lower extremity edema ABDOMEN:abdomen soft, non-tender and normal bowel sounds Musculoskeletal:no cyanosis of digits and no clubbing  PSYCH: alert & oriented x 3 with fluent speech NEURO: no focal motor/sensory  deficits SKIN:  no rashes or significant lesions  LABORATORY DATA:  I have reviewed the data as listed    Component Value Date/Time   NA 138 02/21/2016 1057   NA 140 11/16/2014 1415   K 3.6 02/21/2016 1057   K 3.3 (L) 11/16/2014 1415   CL 106 02/21/2016 1057   CL 107 11/16/2014 1415   CO2 25 02/21/2016 1057   CO2 29 11/16/2014 1415   GLUCOSE 96 02/21/2016 1057   GLUCOSE 117 (H) 11/16/2014 1415   BUN 34 (H) 02/21/2016 1057   BUN 31 (H) 11/16/2014 1415   CREATININE 1.12 (H) 02/21/2016 1057   CREATININE 1.19 (H) 11/16/2014 1415   CALCIUM 9.0 02/21/2016 1057   CALCIUM 9.1 11/16/2014 1415   PROT 6.9 02/21/2016 1057   PROT 6.8 11/16/2014 1415   ALBUMIN 4.3 02/21/2016 1057   ALBUMIN 3.8 11/16/2014 1415   AST 14 (L) 02/21/2016 1057   AST 17 11/16/2014 1415   ALT 12 (L) 02/21/2016 1057   ALT 11 (L) 11/16/2014 1415   ALKPHOS  65 02/21/2016 1057   ALKPHOS 115 11/16/2014 1415   BILITOT 0.8 02/21/2016 1057   BILITOT 0.4 11/16/2014 1415   GFRNONAA 48 (L) 02/21/2016 1057   GFRNONAA 46 (L) 11/16/2014 1415   GFRAA 55 (L) 02/21/2016 1057   GFRAA 54 (L) 11/16/2014 1415    No results found for: SPEP, UPEP  Lab Results  Component Value Date   WBC 4.9 02/21/2016   NEUTROABS 2.8 02/21/2016   HGB 13.3 02/21/2016   HCT 38.9 02/21/2016   MCV 100.3 (H) 02/21/2016   PLT 136 (L) 02/21/2016      Chemistry      Component Value Date/Time   NA 138 02/21/2016 1057   NA 140 11/16/2014 1415   K 3.6 02/21/2016 1057   K 3.3 (L) 11/16/2014 1415   CL 106 02/21/2016 1057   CL 107 11/16/2014 1415   CO2 25 02/21/2016 1057   CO2 29 11/16/2014 1415   BUN 34 (H) 02/21/2016 1057   BUN 31 (H) 11/16/2014 1415   CREATININE 1.12 (H) 02/21/2016 1057   CREATININE 1.19 (H) 11/16/2014 1415      Component Value Date/Time   CALCIUM 9.0 02/21/2016 1057   CALCIUM 9.1 11/16/2014 1415   ALKPHOS 65 02/21/2016 1057   ALKPHOS 115 11/16/2014 1415   AST 14 (L) 02/21/2016 1057   AST 17 11/16/2014 1415   ALT  12 (L) 02/21/2016 1057   ALT 11 (L) 11/16/2014 1415   BILITOT 0.8 02/21/2016 1057   BILITOT 0.4 11/16/2014 1415       RADIOGRAPHIC STUDIES: I have personally reviewed the radiological images as listed and agreed with the findings in the report. Dg Chest 2 View  Result Date: 02/21/2016 CLINICAL DATA:  Non-Hodgkin's lymphoma. Cough and shortness of breath. Acute respiratory failure. EXAM: CHEST  2 VIEW COMPARISON:  Prior chest 07/04/2015.  Prior CT  09/05/2015. FINDINGS: Normal heart size. Trachea deviated to the RIGHT similar to priors, related to the pleuroparenchymal scarring at the RIGHT lung apex. The basilar interstitial changes on the RIGHT are also consistent with scarring. No definite active infiltrates or failure. Port-A-Cath good position, tip SVC. Osteopenia. IMPRESSION: No active infiltrates or failure. Similar appearance to prior chest, with no definite for interval change Electronically Signed   By: Staci Righter M.D.   On: 02/21/2016 10:56     ASSESSMENT & PLAN:  No problem-specific Assessment & Plan notes found for this encounter.   No orders of the defined types were placed in this encounter.  All questions were answered. The patient knows to call the clinic with any problems, questions or concerns.      Cammie Sickle, MD 02/21/2016 11:59 AM

## 2016-02-22 LAB — THYROID PANEL WITH TSH
FREE THYROXINE INDEX: 1.6 (ref 1.2–4.9)
T3 UPTAKE RATIO: 26 % (ref 24–39)
T4, Total: 6.3 ug/dL (ref 4.5–12.0)
TSH: 2.53 u[IU]/mL (ref 0.450–4.500)

## 2016-02-23 DIAGNOSIS — R32 Unspecified urinary incontinence: Principal | ICD-10-CM

## 2016-02-23 DIAGNOSIS — R42 Dizziness and giddiness: Principal | ICD-10-CM

## 2016-02-23 MED ORDER — MECLIZINE HCL 25 MG PO TABS
2 refills | Status: CP
Start: 2016-02-23 — End: 2017-02-19

## 2016-02-23 MED ORDER — OXYBUTYNIN CHLORIDE 5 MG PO TABS
0 refills | Status: CP
Start: 2016-02-23 — End: 2016-03-26

## 2016-02-26 ENCOUNTER — Encounter: Attending: Internal Medicine | Primary: Internal Medicine

## 2016-03-05 ENCOUNTER — Other Ambulatory Visit: Payer: Self-pay | Admitting: *Deleted

## 2016-03-05 DIAGNOSIS — C8590 Non-Hodgkin lymphoma, unspecified, unspecified site: Secondary | ICD-10-CM

## 2016-03-05 MED ORDER — CITALOPRAM HYDROBROMIDE 20 MG PO TABS: 20.0000 mg | ORAL_TABLET | Freq: Every day | ORAL | 3 refills | Status: DC

## 2016-03-05 MED ORDER — OMEPRAZOLE 20 MG PO CPDR: 20.0000 mg | DELAYED_RELEASE_CAPSULE | Freq: Two times a day (BID) | ORAL | 3 refills | Status: DC

## 2016-03-05 NOTE — Addendum Note (Signed)
Addended by: Betti Cruz on: 03/05/2016 08:49 AM   Modules accepted: Orders

## 2016-03-07 ENCOUNTER — Ambulatory Visit: Attending: Internal Medicine | Primary: Internal Medicine

## 2016-03-07 DIAGNOSIS — M1990 Unspecified osteoarthritis, unspecified site: Secondary | ICD-10-CM

## 2016-03-07 DIAGNOSIS — E119 Type 2 diabetes mellitus without complications: Principal | ICD-10-CM

## 2016-03-07 DIAGNOSIS — R32 Unspecified urinary incontinence: Principal | ICD-10-CM

## 2016-03-07 DIAGNOSIS — E1121 Type 2 diabetes mellitus with diabetic nephropathy: Secondary | ICD-10-CM

## 2016-03-07 DIAGNOSIS — Z6841 Body Mass Index (BMI) 40.0 and over, adult: Principal | ICD-10-CM

## 2016-03-07 DIAGNOSIS — I10 Essential (primary) hypertension: Secondary | ICD-10-CM

## 2016-03-07 DIAGNOSIS — E785 Hyperlipidemia, unspecified: Secondary | ICD-10-CM

## 2016-03-07 DIAGNOSIS — Z7189 Other specified counseling: Secondary | ICD-10-CM

## 2016-03-07 DIAGNOSIS — F419 Anxiety disorder, unspecified: Secondary | ICD-10-CM

## 2016-03-07 DIAGNOSIS — R911 Solitary pulmonary nodule: Secondary | ICD-10-CM

## 2016-03-07 DIAGNOSIS — K219 Gastro-esophageal reflux disease without esophagitis: Secondary | ICD-10-CM

## 2016-03-07 DIAGNOSIS — N3000 Acute cystitis without hematuria: Secondary | ICD-10-CM

## 2016-03-07 DIAGNOSIS — F329 Major depressive disorder, single episode, unspecified: Secondary | ICD-10-CM

## 2016-03-07 MED ORDER — INSULIN LISPRO PROT & LISPRO (75-25) 100 UNIT/ML SC SUSP
5 refills | Status: CP
Start: 2016-03-07 — End: 2016-04-05

## 2016-03-07 MED ORDER — CIPROFLOXACIN HCL 500 MG PO TABS
500 mg | Freq: Two times a day (BID) | ORAL | 0 refills | Status: CP
Start: 2016-03-07 — End: ?

## 2016-03-07 MED ORDER — GLOBAL INJECT EASE INSULIN SYR 31G X 5/16" 1 ML MISC
11 refills | Status: CP
Start: 2016-03-07 — End: 2017-04-10

## 2016-03-07 NOTE — Progress Notes
Has UTI, increase insulin by 2 units on am and 2 units on pam nd monitor BS, LDL wnl, trigly slight up, better diet, CT for lung nodule

## 2016-03-13 NOTE — Progress Notes
Psychiatric/Behavioral: Positive for decreased concentration.         Objective:        VITAL SIGNS (all recorded)      Clinic Vitals       03/07/16 1510             Amb Encounter Vitals    Weight 114 kg (251 lb 6.4 oz)    -TD at 03/07/16 1511       Height 1.626 m (5\' 4" )    -TD at 03/07/16 1511       BMI (Calculated) 43.24    -TD at 03/07/16 1511       BSA (Calculated - sq m) 2.27    -TD at 03/07/16 1511       BP 144/56    -TD at 03/07/16 1511       BP Location Left upper arm    -TD at 03/07/16 1511       Position Sitting    -TD at 03/07/16 1511       Pulse 82    -TD at 03/07/16 1511       Pulse Source Radial    -TD at 03/07/16 1511       Resp 18    -TD at 03/07/16 1511       Temp 36.8 ?C (98.3 ?F)    -TD at 03/07/16 1511       Temperature Source Tympanic    -TD at 03/07/16 1511       O2 Saturation 97 %    -TD at 03/07/16 1511       FiO2 Source RA    -TD at 03/07/16 1511       Pain Score Zero    -TD at 03/07/16 1511       Education/Communication Barriers?    Learning/Communication Barriers? No    -TD at 03/07/16 1511       Fall Risk Assessment    Had recent fall / Last 6 months? No recent fall    -TD at 03/07/16 1511       Does patient have a fear of falling? No    -TD at 03/07/16 1511         User Key  (r) = Recorded By, (t) = Taken By, (c) = Cosigned By    Initials Name Effective Dates    TD Duffy, Tammy, MA 10/24/15 -         Physical Exam   Constitutional: She is oriented to person, place, and time. She appears well-developed and well-nourished.   HENT:   Head: Normocephalic and atraumatic.   Right Ear: External ear normal.   Left Ear: External ear normal.   Nose: Nose normal.   Mouth/Throat: Oropharynx is clear and moist.   Eyes: Conjunctivae and EOM are normal. Pupils are equal, round, and reactive to light.   Neck: Normal range of motion. Neck supple.   Cardiovascular: Normal rate, regular rhythm, normal heart sounds and intact distal pulses.    Pulmonary/Chest: Effort normal and breath sounds normal.

## 2016-03-13 NOTE — Progress Notes
Importance of monitoring blood sugars and recording results is discussed at length.  Patient has materials for keeping records and for measuring.  Educational materials regarding diabetes is given to patient.   Patient is made aware of the importance of monitoring HgbA1c every three to six months, having a yearly dilated eye exam, checking feet regularly for damage to the skin, monitoring cholesterol, seeing an Opthalmologist yearly and maintaining a diet consistent with diabetes care.  Patient given educational material regarding diabetes.    Patient advised to avoid driving or operating machinery when she is under the influence of these controlled substances.    Health Maintenance was reviewed. The patient's HM Topic list was:                                            Health Maintenance   Topic Date Due   ? Preventive Wellness Visit  08/23/2016 (Originally 11/16/1963)   ? Colonoscopy  08/23/2025 (Originally 11/16/1993)   ? Influenza Vaccine (1) 03/22/2016   ? Hemoglobin A1C  08/09/2016   ? Diabetic Eye Exam  08/17/2016   ? Mammogram Discussion  10/16/2016   ? Diabetic Foot Exam  10/23/2016   ? Lipid Profile  02/06/2017   ? Urine Protein  02/06/2017   ? Creatinine  02/06/2017   ? Basic Metabolic Panel  02/06/2017   ? Dexa  04/18/2025   ? USPSTF Hepatitis C Screening  Completed   ? DTaP,Tdap,and Td Vaccines  Excluded   ? Pneumovax / Prevnar  Excluded   ? Zoster Vaccine  Excluded

## 2016-03-13 NOTE — Progress Notes
Subjective:   Destiny RedwoodMary Pittman is a 70 y.o. female being seen today for Follow up: Lab Results; Painful Urination; and Ear Fullness (right)       HPI  patient is here to discuss her lab a1c is 9.5 from >10 and LDL is 55, not complient with diet or meds, her Home sugars: flactuate. Last dilated eye exam approximately a few months ago. CO od dysurea and frequencey mostly secondary to UTI. Patient is adherent to low salt diet and complient with current antihypertensive medicine. Blood pressure is well controlled at home.  Patient denies: chest pressure/discomfort, dyspnea, palpitations, irregular heart beats, near-syncope, syncope. Aricept is helping , want to continue. GOA associated with myalgia, using zanaflex which is helping. Due for repeated CT lung to reevaluate lung nodule.    Past Medical History:   Diagnosis Date   ? Anxiety    ? Arthritis    ? Depression    ? Diabetes mellitus    ? GERD (gastroesophageal reflux disease)    ? Hyperlipidemia    ? Hypertension      Past Surgical History:   Procedure Laterality Date   ? HYSTERECTOMY       Family History   Problem Relation Age of Onset   ? No Known Problems Mother    ? No Known Problems Father      Social History     Social History   ? Marital status: Single     Spouse name: N/A   ? Number of children: N/A   ? Years of education: N/A     Occupational History   ? Not on file.     Social History Main Topics   ? Smoking status: Never Smoker   ? Smokeless tobacco: Never Used   ? Alcohol use No   ? Drug use: No   ? Sexual activity: Not on file     Other Topics Concern   ? Not on file     Social History Narrative     Current Outpatient Prescriptions on File Prior to Visit   Medication Sig   ? ADVAIR DISKUS 500-50 MCG/DOSE diskus inhaler INHALE ONE DISKUS AS     DIRECTED TWICE A DAY     .[name].THANK YOU..   ? ALPRAZolam (XANAX) 0.5 MG tablet Take 1 Tablet by mouth 2 times daily.   ? ciprofloxacin-dexamethasone (CIPRODEX) 0.3-0.1 % Suspension Place 4

## 2016-03-13 NOTE — Progress Notes
?   phenazopyridine (PYRIDIUM) 100 MG Tablet Take 1 tablet by mouth 3 times daily as needed for pain.   ? potassium chloride (KLOR-CON M) 20 MEQ tablet TAKE ONE TABLET THREE    TIMES A DAY .[name].THANK YOU..   ? pravastatin (PRAVACHOL) 40 MG tablet TAKE ONE TABLET AT       BEDTIME .[name].THANK YOU..   ? pravastatin (PRAVACHOL) 40 MG Tablet TAKE ONE TABLET AT       BEDTIME .[name].THANK YOU..   ? PROAIR HFA 108 (90 BASE) MCG/ACT Aerosol Solution INHALE 2 PUFFS (INTO     LUNGS) FOUR TIMES A DAY  .[name].THANK YOU..   ? raNITIdine (ZANTAC) 150 MG Tablet TAKE ONE TABLET TWICE A  DAY ##THANK YOU##   ? raNITIdine (ZANTAC) 150 MG Tablet TAKE ONE TABLET TWICE A  DAY ##THANK YOU##   ? tiZANidine (ZANAFLEX) 2 MG Tablet TAKE TWO TABLETS EVERY   EIGHT HOURS (ONLY WHEN   NEEDED) ##THANK YOU##   ? torsemide (DEMADEX) 20 MG Tablet TAKE ONE TABLET EVERY    MORNING ##THANK YOU##   ? VITAMIN D-2 50000 UNITS Capsule TAKE ONE CAPSULE BY MOUTHTWO TIMES PER WEEK FOR 6 WEEKS .[name].THANK YOU.[name].     No current facility-administered medications on file prior to visit.      Allergies   Allergen Reactions   ? Asa [Aspirin] Itching   ? Pcn [Penicillins] Itching   ? Sulfa Drugs Itching         Review of Systems  Review of Systems   Constitutional: Positive for fatigue. Negative for chills and fever.   HENT: Negative for congestion, postnasal drip, rhinorrhea and sinus pressure.    Eyes: Negative.    Respiratory: Negative.  Negative for cough and wheezing.    Cardiovascular: Positive for leg swelling. Negative for chest pain and palpitations.        Right leg   Gastrointestinal: Negative.  Negative for abdominal pain, constipation, diarrhea, nausea and vomiting.   Endocrine: Negative.    Genitourinary: Positive for dysuria and frequency.   Musculoskeletal: Positive for arthralgias and myalgias.   Skin: Positive for rash.        Both legs   Allergic/Immunologic: Negative.    Neurological: Negative.    Hematological: Negative.

## 2016-03-13 NOTE — Progress Notes
drops into the left ear 2 times daily.   ? diclofenac (VOLTAREN GEL) 1 % Gel topical gel APPLY 4 GM TOPICALLY FOURTIMES A DAY **MEASURE    DOSE USING ENCLOSED DOSE CARD*   ? donepezil (ARICEPT) 5 MG Tablet TAKE ONE TABLET BY MOUTH AT BEDTIME .[name].THANK YOU..   ? doxycycline (MONODOX) 100 MG Capsule TAKE ONE CAPSULE TWICE A DAY   ? doxycycline (VIBRA-TABS) 100 MG Tablet TAKE ONE TABLET TWICE A  DAY .[name].THANK YOU..   ? esomeprazole (NexIUM) 40 MG Capsule Delayed Release TAKE ONE CAPSULE EVERY   DAY .[name].THANK YOU..   ? fluticasone (FLONASE) 50 MCG/ACT Suspension SPRAY ONE PUFF INTO NOSE TWICE A DAY ==SHAKE      GENTLY==   ? fluticasone (FLONASE) 50 MCG/ACT Suspension SPRAY ONE PUFF INTO NOSE TWICE A DAY ==SHAKE      GENTLY==   ? gabapentin (NEURONTIN) 400 MG Capsule TAKE 2 CAPSULES BY MOUTH 3 TIMES DAILY.   ? hydroCHLOROthiazide (HYDRODIURIL) 25 MG Tablet TAKE ONE TABLET EVERY DAYFOR FLUIDS .[name].THANK YOU..   ? hydroCHLOROthiazide (HYDRODIURIL) 25 MG Tablet TAKE ONE TABLET EVERY DAYFOR FLUIDS .[name].THANK YOU..   ? insulin aspart protamine-insulin aspart (NovoLOG MIX 70/30) (70-30) 100 UNIT/ML Suspension 40 units am and 28 unit pm   ? lisinopril (PRINIVIL,ZESTRIL) 10 MG Tablet TAKE ONE TABLET EVERY DAY.[name].THANK YOU..   ? meclizine (ANTIVERT) 25 MG Tablet TAKE ONE OR TWO TABLETS  EVERY EIGHT HOURS .[name].THANKYOU..   ? meloxicam (MOBIC) 15 MG tablet TAKE ONE TABLET DAILY    WITH A MEAL ##THANK YOU##   ? metFORMIN (GLUCOPHAGE) 500 MG tablet Take 1 Tablet by mouth 2 times daily (with meals). No results found for this basename: CREATININE   ? mupirocin (BACTROBAN) 2 % Ointment APPLY TO AFFECTED AREAS  TWICE A DAY ##THANK YOU##   ? MYRBETRIQ 25 MG Tablet Extended Release 24 Hour TAKE ONE TABLET BY MOUTH AT BEDTIME .[name].THANK YOU..   ? nystatin (MYCOSTATIN) 100000 UNIT/ML Suspension Take 5 mLs by mouth 4 times daily.   ? oxybutynin (DITROPAN) 5 MG Tablet TAKE TWO TABLETS BY MOUTHTWICE A DAY .[name].THANK YOU..[name]

## 2016-03-13 NOTE — Progress Notes
Abdominal: Soft. Bowel sounds are normal. There is tenderness in the suprapubic area.   Musculoskeletal: Normal range of motion. She exhibits edema and tenderness.   Allover joints, 1+ edema right leg   Neurological: She is alert and oriented to person, place, and time. She has normal reflexes.   Skin: Skin is warm and dry.   Psychiatric: She has a normal mood and affect. Her behavior is normal. Judgment and thought content normal. Cognition and memory are impaired. She exhibits abnormal recent memory.   Nursing note and vitals reviewed.  Foot Exam: foot deformity Pes Planus bilaterally      Assessment:       ICD-10-CM ICD-9-CM    1. BMI 40.0-44.9, adult Z68.41 V85.41    2. Advanced directives, counseling/discussion Z71.89 V65.49    3. Acute cystitis without hematuria N30.00 595.0 ciprofloxacin (CIPRO) 500 MG Tablet   4. Diabetes mellitus without complication E11.9 250.00    5. Dyslipidemia E78.5 272.4    6. Essential hypertension I10 401.9    7. Pulmonary nodule R91.1 793.11 CT Chest w Con          Plan:   increase insulin by 2 units on am and 2 units on pm nd monitor BS  CT for lung nodule  Continue current meds.  Diet and exercise as long patient can tolerate.  Salt restrictions.  FU with other MDs as scheduled.  Meds benefits and side effects discussed with patient. Patient sounds understanding.  Orders Placed This Encounter   Medications   ? ciprofloxacin (CIPRO) 500 MG Tablet     Sig: Take 1 tablet by mouth 2 times daily for 7 days.     Dispense:  14 tablet     Refill:  0     Orders Placed This Encounter   Procedures   ? CT Chest w Con   ? POCT Urinalysis w/out microscopy inhouse   Teaching re:  DM: untreated elevations of blood sugar can lead to problems with eyes, nerves, kidneys, heart, blood vessels, and integrity of feet.  Discussed pathophysiology of pancreas, insulin, insulin resistance, effect of obesity and smoking, concept of syndrome X

## 2016-03-18 ENCOUNTER — Other Ambulatory Visit: Payer: Self-pay | Admitting: Internal Medicine

## 2016-03-23 DIAGNOSIS — K210 Gastro-esophageal reflux disease with esophagitis: Secondary | ICD-10-CM

## 2016-03-23 DIAGNOSIS — E785 Hyperlipidemia, unspecified: Principal | ICD-10-CM

## 2016-03-23 DIAGNOSIS — R32 Unspecified urinary incontinence: Principal | ICD-10-CM

## 2016-03-23 DIAGNOSIS — M791 Myalgia, unspecified site: Secondary | ICD-10-CM

## 2016-03-25 MED ORDER — PRAVASTATIN SODIUM 40 MG PO TABS
0 refills | Status: CP
Start: 2016-03-25 — End: 2016-05-03

## 2016-03-26 ENCOUNTER — Inpatient Hospital Stay: Payer: Medicare Other | Attending: Internal Medicine

## 2016-03-26 ENCOUNTER — Telehealth: Payer: Self-pay | Admitting: *Deleted

## 2016-03-26 DIAGNOSIS — C8332 Diffuse large B-cell lymphoma, intrathoracic lymph nodes: Secondary | ICD-10-CM | POA: Insufficient documentation

## 2016-03-26 DIAGNOSIS — C8330 Diffuse large B-cell lymphoma, unspecified site: Secondary | ICD-10-CM

## 2016-03-26 LAB — CBC WITH DIFFERENTIAL/PLATELET
Basophils Absolute: 0.1 10*3/uL (ref 0–0.1)
Basophils Relative: 1 %
EOS PCT: 5 %
Eosinophils Absolute: 0.3 10*3/uL (ref 0–0.7)
HEMATOCRIT: 35.9 % (ref 35.0–47.0)
Hemoglobin: 12.3 g/dL (ref 12.0–16.0)
LYMPHS ABS: 1.2 10*3/uL (ref 1.0–3.6)
LYMPHS PCT: 23 %
MCH: 33.9 pg (ref 26.0–34.0)
MCHC: 34.3 g/dL (ref 32.0–36.0)
MCV: 99 fL (ref 80.0–100.0)
MONO ABS: 0.5 10*3/uL (ref 0.2–0.9)
Monocytes Relative: 10 %
NEUTROS ABS: 3.3 10*3/uL (ref 1.4–6.5)
Neutrophils Relative %: 61 %
PLATELETS: 204 10*3/uL (ref 150–440)
RBC: 3.62 MIL/uL — AB (ref 3.80–5.20)
RDW: 13.3 % (ref 11.5–14.5)
WBC: 5.5 10*3/uL (ref 3.6–11.0)

## 2016-03-26 MED ORDER — RANITIDINE HCL 150 MG PO TABS
0 refills | Status: CP
Start: 2016-03-26 — End: 2016-04-25

## 2016-03-26 MED ORDER — DONEPEZIL HCL 5 MG PO TABS
1 refills | Status: CP
Start: 2016-03-26 — End: 2016-05-03

## 2016-03-26 MED ORDER — TIZANIDINE HCL 2 MG PO TABS
0 refills | Status: CP
Start: 2016-03-26 — End: 2016-04-25

## 2016-03-26 MED ORDER — MYRBETRIQ 25 MG PO TB24
1 refills | Status: CP
Start: 2016-03-26 — End: 2016-05-03

## 2016-03-26 MED ORDER — ESOMEPRAZOLE MAGNESIUM 40 MG PO CPDR
0 refills | Status: CP
Start: 2016-03-26 — End: 2016-04-25

## 2016-03-26 MED ORDER — OXYBUTYNIN CHLORIDE 5 MG PO TABS
0 refills | Status: CP
Start: 2016-03-26 — End: 2016-04-25

## 2016-03-26 NOTE — Telephone Encounter (Signed)
Patient provided with her lab results. plt count back up to normal. Pt thanked me for the call back.

## 2016-03-26 NOTE — Telephone Encounter (Signed)
-----   Message from Cammie Sickle, MD sent at 03/26/2016  4:37 PM EDT ----- Please inform pt that his platelets- are normal. Follow up as planned/labs.

## 2016-03-27 DIAGNOSIS — I10 Essential (primary) hypertension: Principal | ICD-10-CM

## 2016-03-28 ENCOUNTER — Encounter: Payer: Self-pay | Admitting: Internal Medicine

## 2016-03-28 ENCOUNTER — Ambulatory Visit (INDEPENDENT_AMBULATORY_CARE_PROVIDER_SITE_OTHER): Payer: Medicare Other | Admitting: Internal Medicine

## 2016-03-28 VITALS — BP 120/64 | HR 94 | Ht 63.0 in | Wt 127.6 lb

## 2016-03-28 DIAGNOSIS — J209 Acute bronchitis, unspecified: Secondary | ICD-10-CM | POA: Diagnosis not present

## 2016-03-28 MED ORDER — LISINOPRIL 10 MG PO TABS
1 refills | Status: CP
Start: 2016-03-28 — End: 2016-04-05

## 2016-03-28 MED ORDER — GABAPENTIN 400 MG PO CAPS
1 refills | Status: CP
Start: 2016-03-28 — End: 2016-05-03

## 2016-03-28 MED ORDER — AMOXICILLIN-POT CLAVULANATE 875-125 MG PO TABS: 1.0000 | ORAL_TABLET | Freq: Two times a day (BID) | ORAL | 0 refills | Status: AC

## 2016-03-28 MED ORDER — PREDNISONE 20 MG PO TABS: 20.0000 mg | ORAL_TABLET | Freq: Every day | ORAL | 1 refills | Status: DC

## 2016-03-28 NOTE — Progress Notes (Signed)
[facility] Consultation    Date: 03/28/2016  MRN# [id number] Anna Mcbride [birth year]  Referring Physician: Dr. Jeb Levering PMD - Dr. Gilford Rile [facility]) Anna Mcbride is a 70 y.o. old female seen in consultation for recurrent pulmonary infection  CC:  Chief Complaint  Patient presents with  . Follow-up    pt states breathing was doing well until 2wk ago pt currently has prod cough w/tan mucus, low grade temp of 99.4, sweats & chills last week but has since subsided.     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  CT chest 09/05/15 shows stable RUL infiltrate      Review of Chart ONCOLOGY History 1. 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 Status post 6 cycles of chemotherapy with CHOP9nd involved field radiation treatment 18,00 rads to the mantle field and mediasinal boost 2340. finished radiation and chemotherapy in November of 2000 residual mediastinotomy mass 2 cm 2. In May of 2001 progressive disease with mediastinal mass becoming bigger 8 cm3. The patient underwent 2 cycles of chemotherapy withRituxan, and ICE.(June of 2001) Followed by 35more cycles at [facility] with DHAP.and high-dose chemotherapy with BEAM finished treatment in October of 2001. patient had significant pancytopenia as well as fever lasting for 3 months after transplant. Recurrent sinus infection and pulmonary infection 3. Patient underwent EUS biopsy  of which was negative . (November, 2012) 4. 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  HENT: Positive for congestion.   Eyes: Negative for blurred vision and double vision.  Respiratory: Positive for cough, sputum production and shortness of breath. Negative for wheezing.   Cardiovascular: Negative for chest pain, palpitations, orthopnea and leg swelling.  Gastrointestinal: Negative for abdominal pain, heartburn, nausea and vomiting.  Neurological: Negative for dizziness.  Psychiatric/Behavioral: Negative for depression.  All other systems reviewed and are negative.    Physical Examination:   BP 120/64 (BP Location: Left Arm, Cuff Size: Normal)   Pulse 94   Ht 5\' 3"  (1.6 m)   Wt 127 lb 9.6 oz (57.9 kg)   SpO2 98%   BMI 22.60 kg/m   General Appearance: No distress  Neuro:without focal findings, mental status, speech normal, alert and oriented, cranial nerves 2-12 intact, reflexes normal and symmetric, sensation grossly normal  HEENT: PERRLA, EOM intact, no ptosis, no other lesions noticed, right ear canal with no sig erythema, could not visualize the TM due to cerumen impaction ; Mallampati 2 Pulmonary: normal breath sounds., diaphragmatic excursion normal.No wheezing, No rales;   Sputum Production:   CardiovascularNormal S1,S2.  No m/r/g.  Abdominal aorta pulsation normal.    Skin:   warm, no rashes, no ecchymosis  Extremities: normal, no cyanosis, clubbing, no edema, warm with normal  capillary refill. Other findings:none   CT chest 09/05/15  IMPRESSION: 1. Perihilar consolidation which extends into the RIGHT upper lobe with chronic bronchiectasis is most consistent with radiation change. 2. Ill-defined tissue planes in the mediastinum without clear evidence of nodal metastasis. 3. Some improvement in nodular airspace disease in the RIGHT lower lobe suggests improving chronic infectious process. 4. No convincing evidence of lung cancer recurrence.     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 with previous recurrent resp infections now with acute bronchitis   1.Acute Bronchitis with Cough -start prednisone 20 mg daily for 5 days  and augmentin 875 mg daily for 7 days -use oxygen as needed    2.Has a history of diffuse large B-cell lymphoma, chronic infiltrates suspected to be from radiation pneumonitis, -Continue with Advair -Continue supplemental oxygen as needed -albuterol as needed   3.Chronic Cough -robitussin with codeine as needed - no indication for prednisone at this time   Follow up in 3 months    The Patient requires high complexity decision making for assessment and support, frequent evaluation and titration of therapies.  Patient/Family are satisfied with Plan of action and management. All questions answered  [name], M.D.  [facility]  Medical Director [place] Director [facility] Cardio-Pulmonary Department

## 2016-03-28 NOTE — Patient Instructions (Signed)
Acute bronchitis Start prednisone 20 mg daily for 5 days Augmentin 875 mg by mouth twice daily for 7 days Use oxygen as needed   Acute Bronchitis Bronchitis is inflammation of the airways that extend from the windpipe into the lungs (bronchi). The inflammation often causes mucus to develop. This leads to a cough, which is the most common symptom of bronchitis.  In acute bronchitis, the condition usually develops suddenly and goes away over time, usually in a couple weeks. Smoking, allergies, and asthma can make bronchitis worse. Repeated episodes of bronchitis may cause further lung problems.  CAUSES Acute bronchitis is most often caused by the same virus that causes a cold. The virus can spread from person to person (contagious) through coughing, sneezing, and touching contaminated objects. SIGNS AND SYMPTOMS   Cough.   Fever.   Coughing up mucus.   Body aches.   Chest congestion.   Chills.   Shortness of breath.   Sore throat.  DIAGNOSIS  Acute bronchitis is usually diagnosed through a physical exam. Your health care provider will also ask you questions about your medical history. Tests, such as chest X-rays, are sometimes done to rule out other conditions.  TREATMENT  Acute bronchitis usually goes away in a couple weeks. Oftentimes, no medical treatment is necessary. Medicines are sometimes given for relief of fever or cough. Antibiotic medicines are usually not needed but may be prescribed in certain situations. In some cases, an inhaler may be recommended to help reduce shortness of breath and control the cough. A cool mist vaporizer may also be used to help thin bronchial secretions and make it easier to clear the chest.  HOME CARE INSTRUCTIONS  Get plenty of rest.   Drink enough fluids to keep your urine clear or pale yellow (unless you have a medical condition that requires fluid restriction). Increasing fluids may help thin your respiratory secretions (sputum)  and reduce chest congestion, and it will prevent dehydration.   Take medicines only as directed by your health care provider.  If you were prescribed an antibiotic medicine, finish it all even if you start to feel better.  Avoid smoking and secondhand smoke. Exposure to cigarette smoke or irritating chemicals will make bronchitis worse. If you are a smoker, consider using nicotine gum or skin patches to help control withdrawal symptoms. Quitting smoking will help your lungs heal faster.   Reduce the chances of another bout of acute bronchitis by washing your hands frequently, avoiding people with cold symptoms, and trying not to touch your hands to your mouth, nose, or eyes.   Keep all follow-up visits as directed by your health care provider.  SEEK MEDICAL CARE IF: Your symptoms do not improve after 1 week of treatment.  SEEK IMMEDIATE MEDICAL CARE IF:  You develop an increased fever or chills.   You have chest pain.   You have severe shortness of breath.  You have bloody sputum.   You develop dehydration.  You faint or repeatedly feel like you are going to pass out.  You develop repeated vomiting.  You develop a severe headache. MAKE SURE YOU:   Understand these instructions.  Will watch your condition.  Will get help right away if you are not doing well or get worse.   This information is not intended to replace advice given to you by your health care provider. Make sure you discuss any questions you have with your health care provider.   Document Released: 08/15/2004 Document Revised: 07/29/2014 Document Reviewed: 12/29/2012  Chartered certified accountant Patient Education [employer].

## 2016-04-03 ENCOUNTER — Other Ambulatory Visit: Payer: Self-pay | Admitting: *Deleted

## 2016-04-03 MED ORDER — LEVOTHYROXINE SODIUM 25 MCG PO TABS: 25.0000 ug | ORAL_TABLET | Freq: Every day | ORAL | 3 refills | Status: DC

## 2016-04-04 ENCOUNTER — Ambulatory Visit: Attending: Internal Medicine | Primary: Internal Medicine

## 2016-04-04 DIAGNOSIS — R399 Unspecified symptoms and signs involving the genitourinary system: Secondary | ICD-10-CM

## 2016-04-04 DIAGNOSIS — K219 Gastro-esophageal reflux disease without esophagitis: Secondary | ICD-10-CM

## 2016-04-04 DIAGNOSIS — Z1211 Encounter for screening for malignant neoplasm of colon: Secondary | ICD-10-CM

## 2016-04-04 DIAGNOSIS — I10 Essential (primary) hypertension: Secondary | ICD-10-CM

## 2016-04-04 DIAGNOSIS — Z794 Long term (current) use of insulin: Secondary | ICD-10-CM

## 2016-04-04 DIAGNOSIS — Z6841 Body Mass Index (BMI) 40.0 and over, adult: Principal | ICD-10-CM

## 2016-04-04 DIAGNOSIS — F329 Major depressive disorder, single episode, unspecified: Secondary | ICD-10-CM

## 2016-04-04 DIAGNOSIS — F419 Anxiety disorder, unspecified: Secondary | ICD-10-CM

## 2016-04-04 DIAGNOSIS — J449 Chronic obstructive pulmonary disease, unspecified: Secondary | ICD-10-CM

## 2016-04-04 DIAGNOSIS — E1165 Type 2 diabetes mellitus with hyperglycemia: Secondary | ICD-10-CM

## 2016-04-04 DIAGNOSIS — E785 Hyperlipidemia, unspecified: Secondary | ICD-10-CM

## 2016-04-04 DIAGNOSIS — E119 Type 2 diabetes mellitus without complications: Principal | ICD-10-CM

## 2016-04-04 DIAGNOSIS — Z7189 Other specified counseling: Secondary | ICD-10-CM

## 2016-04-04 DIAGNOSIS — M1990 Unspecified osteoarthritis, unspecified site: Secondary | ICD-10-CM

## 2016-04-04 DIAGNOSIS — E1142 Type 2 diabetes mellitus with diabetic polyneuropathy: Secondary | ICD-10-CM

## 2016-04-04 MED ORDER — LISINOPRIL 10 MG PO TABS
3 refills | Status: CP
Start: 2016-04-04 — End: 2016-05-03

## 2016-04-04 MED ORDER — INSULIN LISPRO PROT & LISPRO (75-25) 100 UNIT/ML SC SUSP
5 refills | Status: CP
Start: 2016-04-04 — End: 2016-08-02

## 2016-04-04 MED ORDER — TORSEMIDE 20 MG PO TABS
3 refills | Status: CP
Start: 2016-04-04 — End: 2016-05-03

## 2016-04-04 MED ORDER — FLUTICASONE-SALMETEROL 250-50 MCG/DOSE IN AEPB
1 | Freq: Two times a day (BID) | RESPIRATORY_TRACT | 2 refills | Status: CP
Start: 2016-04-04 — End: 2017-04-28

## 2016-04-04 MED ORDER — HYDROCHLOROTHIAZIDE 25 MG PO TABS
2 refills | Status: CP
Start: 2016-04-04 — End: 2016-05-03

## 2016-04-16 NOTE — Progress Notes
O2 Saturation 97 %    -TD at 04/04/16 1502       FiO2 Source RA    -TD at 04/04/16 1502       Pain Score Zero    -TD at 04/04/16 1502       Education/Communication Barriers?    Learning/Communication Barriers? No    -TD at 04/04/16 1502       Fall Risk Assessment    Had recent fall / Last 6 months? No recent fall    -TD at 04/04/16 1502       Does patient have a fear of falling? No    -TD at 04/04/16 1502         User Key  (r) = Recorded By, (t) = Taken By, (c) = Cosigned By    Initials Name Effective Dates    TD Duffy, Tammy, MA 10/24/15 -         Physical Exam   Constitutional: She is oriented to person, place, and time. She appears well-developed and well-nourished.   HENT:   Head: Normocephalic and atraumatic.   Right Ear: External ear normal.   Left Ear: External ear normal.   Nose: Nose normal.   Mouth/Throat: Oropharynx is clear and moist.   Eyes: Conjunctivae and EOM are normal. Pupils are equal, round, and reactive to light.   Neck: Normal range of motion. Neck supple.   Cardiovascular: Normal rate, regular rhythm, normal heart sounds and intact distal pulses.    Pulmonary/Chest: Effort normal. She has decreased breath sounds.   Abdominal: Soft. Bowel sounds are normal. There is tenderness in the suprapubic area.   Musculoskeletal: Normal range of motion. She exhibits edema and tenderness.   Allover joints, 1+ edema right leg   Neurological: She is alert and oriented to person, place, and time. She has normal reflexes.   Skin: Skin is warm and dry.   Psychiatric: She has a normal mood and affect. Her behavior is normal. Judgment and thought content normal. Cognition and memory are impaired. She exhibits abnormal recent memory.   Nursing note and vitals reviewed.  Foot Exam: foot deformity Pes Planus bilaterally      Assessment:       ICD-10-CM ICD-9-CM    1. BMI 40.0-44.9, adult Z68.41 V85.41    2. Advanced directives, counseling/discussion Z71.89 V65.49

## 2016-04-16 NOTE — Progress Notes
Subjective:   Destiny Pittman is a 70 y.o. female being seen today for Follow-up and Medications Refill       HPI  patient is CO of dysurea and frequencey for few days secondary to UTI. Patient is adherent to low salt diet and complient with current antihypertensive medicine. Blood pressure is well controlled at home.  Patient denies: chest pressure/discomfort, dyspnea, palpitations, irregular heart beats, near-syncope, syncope. Aricept is helping , want to continue. CO of rashes in both legs. GOA associated with myalgia, using zanaflex which is helping. DM not well controlled.    Past Medical History:   Diagnosis Date   ? Anxiety    ? Arthritis    ? Depression    ? Diabetes mellitus    ? GERD (gastroesophageal reflux disease)    ? Hyperlipidemia    ? Hypertension      Past Surgical History:   Procedure Laterality Date   ? HYSTERECTOMY       Family History   Problem Relation Age of Onset   ? No Known Problems Mother    ? No Known Problems Father      Social History     Social History   ? Marital status: Single     Spouse name: N/A   ? Number of children: N/A   ? Years of education: N/A     Occupational History   ? Not on file.     Social History Main Topics   ? Smoking status: Never Smoker   ? Smokeless tobacco: Never Used   ? Alcohol use No   ? Drug use: No   ? Sexual activity: No     Other Topics Concern   ? Not on file     Social History Narrative     Current Outpatient Prescriptions on File Prior to Visit   Medication Sig   ? ALPRAZolam (XANAX) 0.5 MG tablet Take 1 Tablet by mouth 2 times daily.   ? ciprofloxacin-dexamethasone (CIPRODEX) 0.3-0.1 % Suspension Place 4 drops into the left ear 2 times daily.   ? diclofenac (VOLTAREN GEL) 1 % Gel topical gel APPLY 4 GM TOPICALLY FOURTIMES A DAY **MEASURE    DOSE USING ENCLOSED DOSE CARD*   ? donepezil (ARICEPT) 5 MG Tablet TAKE ONE TABLET BY MOUTH AT BEDTIME .[name].THANK YOU..   ? doxycycline (MONODOX) 100 MG Capsule TAKE ONE CAPSULE TWICE A DAY

## 2016-04-16 NOTE — Progress Notes
?   COLONOSCOPY W/ OR W/O BIOPSY   ? Culture, Urine   ? POCT Urinalysis w/o Microscopy auto   Teaching re:  DM: untreated elevations of blood sugar can lead to problems with eyes, nerves, kidneys, heart, blood vessels, and integrity of feet.  Discussed pathophysiology of pancreas, insulin, insulin resistance, effect of obesity and smoking, concept of syndrome X  Importance of monitoring blood sugars and recording results is discussed at length.  Patient has materials for keeping records and for measuring.  Educational materials regarding diabetes is given to patient.   Patient is made aware of the importance of monitoring HgbA1c every three to six months, having a yearly dilated eye exam, checking feet regularly for damage to the skin, monitoring cholesterol, seeing an Opthalmologist yearly and maintaining a diet consistent with diabetes care.  Patient given educational material regarding diabetes.    Patient advised to avoid driving or operating machinery when she is under the influence of these controlled substances.    Health Maintenance was reviewed. The patient's HM Topic list was:                                            Health Maintenance   Topic Date Due   ? Colon Cancer Screening  11/16/1993   ? Preventive Wellness Visit  06/04/2016 (Originally 11/16/1963)   ? Hemoglobin A1C  08/09/2016   ? Diabetic Eye Exam  08/17/2016   ? Mammogram Discussion  10/16/2016   ? Diabetic Foot Exam  10/23/2016   ? Lipid Profile  02/06/2017   ? Urine Protein  02/06/2017   ? Creatinine  02/06/2017   ? Basic Metabolic Panel  02/06/2017   ? Dexa  04/18/2025   ? USPSTF Hepatitis C Screening  Completed   ? Influenza Vaccine  Addressed   ? DTaP,Tdap,and Td Vaccines  Excluded   ? Pneumovax / Prevnar  Excluded   ? Zoster Vaccine  Excluded

## 2016-04-16 NOTE — Progress Notes
?   doxycycline (VIBRA-TABS) 100 MG Tablet TAKE ONE TABLET TWICE A  DAY .[name].THANK YOU..   ? esomeprazole (NexIUM) 40 MG Capsule Delayed Release TAKE ONE CAPSULE EVERY   DAY .[name].THANK YOU..   ? fluticasone (FLONASE) 50 MCG/ACT Suspension SPRAY ONE PUFF INTO NOSE TWICE A DAY ==SHAKE      GENTLY==   ? fluticasone (FLONASE) 50 MCG/ACT Suspension SPRAY ONE PUFF INTO NOSE TWICE A DAY ==SHAKE      GENTLY==   ? gabapentin (NEURONTIN) 400 MG Capsule TAKE 2 CAPSULES BY MOUTH 3 TIMES DAILY.   ? GLOBAL INJECT EASE INSULIN SYR 31G X 5/16" 1 ML Miscellaneous USE ONE TWICE A DAY      .[name].THANK YOU..   ? meclizine (ANTIVERT) 25 MG Tablet TAKE ONE OR TWO TABLETS  EVERY EIGHT HOURS .[name].THANKYOU..   ? meloxicam (MOBIC) 15 MG tablet TAKE ONE TABLET DAILY    WITH A MEAL ##THANK YOU##   ? metFORMIN (GLUCOPHAGE) 500 MG tablet Take 1 Tablet by mouth 2 times daily (with meals). No results found for this basename: CREATININE   ? mupirocin (BACTROBAN) 2 % Ointment APPLY TO AFFECTED AREAS  TWICE A DAY ##THANK YOU##   ? MYRBETRIQ 25 MG Tablet Extended Release 24 Hour TAKE ONE TABLET BY MOUTH AT BEDTIME .[name].THANK YOU..   ? nystatin (MYCOSTATIN) 100000 UNIT/ML Suspension Take 5 mLs by mouth 4 times daily.   ? oxybutynin (DITROPAN) 5 MG Tablet TAKE TWO TABLETS BY MOUTHTWICE A DAY .[name].THANK YOU..   ? phenazopyridine (PYRIDIUM) 100 MG Tablet Take 1 tablet by mouth 3 times daily as needed for pain.   ? potassium chloride (KLOR-CON M) 20 MEQ tablet TAKE ONE TABLET THREE    TIMES A DAY .[name].THANK YOU..   ? pravastatin (PRAVACHOL) 40 MG tablet TAKE ONE TABLET AT       BEDTIME .[name].THANK YOU..   ? pravastatin (PRAVACHOL) 40 MG Tablet TAKE ONE TABLET AT       BEDTIME .[name].THANK YOU..   ? PROAIR HFA 108 (90 BASE) MCG/ACT Aerosol Solution INHALE 2 PUFFS (INTO     LUNGS) FOUR TIMES A DAY  .[name].THANK YOU..   ? raNITIdine (ZANTAC) 150 MG Tablet TAKE ONE TABLET TWICE A  DAY ##THANK YOU##   ? raNITIdine (ZANTAC) 150 MG Tablet TAKE ONE TABLET TWICE A  DAY ##THANK YOU##

## 2016-04-16 NOTE — Progress Notes
?   tiZANidine (ZANAFLEX) 2 MG Tablet TAKE TWO TABLETS EVERY   EIGHT HOURS (ONLY WHEN   NEEDED) ##THANK YOU##   ? VITAMIN D-2 50000 UNITS Capsule TAKE ONE CAPSULE BY MOUTHTWO TIMES PER WEEK FOR 6 WEEKS .[name].THANK YOU.[name].     No current facility-administered medications on file prior to visit.      Allergies   Allergen Reactions   ? Asa [Aspirin] Itching   ? Pcn [Penicillins] Itching   ? Sulfa Drugs Itching         Review of Systems  Review of Systems   Constitutional: Positive for fatigue. Negative for chills and fever.   HENT: Negative for congestion, postnasal drip, rhinorrhea and sinus pressure.    Eyes: Negative.    Respiratory: Negative.  Negative for cough and wheezing.    Cardiovascular: Positive for leg swelling. Negative for chest pain and palpitations.        Both leg   Gastrointestinal: Negative.  Negative for abdominal pain, constipation, diarrhea, nausea and vomiting.   Endocrine: Negative.    Genitourinary: Positive for dysuria and frequency.   Musculoskeletal: Positive for arthralgias and myalgias.   Skin: Positive for rash.        Both legs   Allergic/Immunologic: Negative.    Neurological: Negative.    Hematological: Negative.    Psychiatric/Behavioral: Positive for decreased concentration.         Objective:        VITAL SIGNS (all recorded)      Clinic Vitals       04/04/16 1501             Amb Encounter Vitals    Weight 110.7 kg (244 lb)    -TD at 04/04/16 1502       Height 1.626 m (5\' 4" )    -TD at 04/04/16 1502       BMI (Calculated) 41.97    -TD at 04/04/16 1502       BSA (Calculated - sq m) 2.24    -TD at 04/04/16 1502       BP 150/58    -TD at 04/04/16 1502       BP Location Left upper arm    -TD at 04/04/16 1502       Position Sitting    -TD at 04/04/16 1502       Pulse 84    -TD at 04/04/16 1502       Pulse Source Radial    -TD at 04/04/16 1502       Resp 18    -TD at 04/04/16 1502       Temp 37.3 ?C (99.2 ?F)    -TD at 04/04/16 1502       Temperature Source Tympanic    -TD at 04/04/16 1502

## 2016-04-16 NOTE — Progress Notes
3. Encounter for screening colonoscopy Z12.11 V76.51 COLONOSCOPY W/ OR W/O BIOPSY   4. Urinary tract infection symptoms R39.9 788.99 POCT Urinalysis w/o Microscopy auto      Culture, Urine      Culture, Urine   5. Uncontrolled type 2 diabetes mellitus with diabetic polyneuropathy, with long-term current use of insulin E11.42 250.62 insulin lispro protamine-lispro (HumaLOG 75/25) vial    Z79.4 357.2     E11.65 V58.67    6. Essential hypertension I10 401.9 lisinopril (PRINIVIL,ZESTRIL) 10 MG Tablet      hydroCHLOROthiazide (HYDRODIURIL) 25 MG Tablet      torsemide (DEMADEX) 20 MG Tablet   7. Chronic obstructive pulmonary disease, unspecified COPD type J44.9 496 fluticasone-salmeterol (ADVAIR DISKUS) 250-50 MCG/DOSE Aerosol Powder Breath Activated          Plan:   Continue current meds.  Diet and exercise as long patient can tolerate.  Salt restrictions.  FU with other MDs as scheduled.  Meds benefits and side effects discussed with patient. Patient sounds understanding.  Orders Placed This Encounter   Medications   ? insulin lispro protamine-lispro (HumaLOG 75/25) vial     Sig: 50 units qam 35 min before breakfast and 38 unit qpm 30 min before supper     Dispense:  60 vial     Refill:  5     NEEDS REFILLS   ? lisinopril (PRINIVIL,ZESTRIL) 10 MG Tablet     Sig: TAKE ONE TABLET EVERY DAY.[name].THANK YOU.[name].     Dispense:  30 tablet     Refill:  3     NEEDS REFILLS   ? hydroCHLOROthiazide (HYDRODIURIL) 25 MG Tablet     Sig: TAKE ONE TABLET EVERY DAYFOR FLUIDS .[name].THANK YOU.[name].     Dispense:  30 tablet     Refill:  2     NEEDS REFILL   ? torsemide (DEMADEX) 20 MG Tablet     Sig: TAKE ONE TABLET EVERY    MORNING ##THANK YOU##     Dispense:  30 tablet     Refill:  3     NEEDS REFILL   ? fluticasone-salmeterol (ADVAIR DISKUS) 250-50 MCG/DOSE Aerosol Powder Breath Activated     Sig: Inhale 1 puff 2 times daily. Rinse mouth well and spit after each use.     Dispense:  60 each     Refill:  2     Orders Placed This Encounter   Procedures

## 2016-04-24 DIAGNOSIS — E785 Hyperlipidemia, unspecified: Principal | ICD-10-CM

## 2016-04-24 DIAGNOSIS — K210 Gastro-esophageal reflux disease with esophagitis: Principal | ICD-10-CM

## 2016-04-24 DIAGNOSIS — M791 Myalgia, unspecified site: Secondary | ICD-10-CM

## 2016-04-24 DIAGNOSIS — R32 Unspecified urinary incontinence: Secondary | ICD-10-CM

## 2016-04-24 MED ORDER — PRAVASTATIN SODIUM 40 MG PO TABS
0 refills | Status: CP
Start: 2016-04-24 — End: 2016-05-03

## 2016-04-25 DIAGNOSIS — K210 Gastro-esophageal reflux disease with esophagitis: Principal | ICD-10-CM

## 2016-04-25 MED ORDER — RANITIDINE HCL 150 MG PO TABS
3 refills | Status: CP
Start: 2016-04-25 — End: 2016-05-03

## 2016-04-25 MED ORDER — ESOMEPRAZOLE MAGNESIUM 40 MG PO CPDR
2 refills | Status: CP
Start: 2016-04-25 — End: 2016-05-03

## 2016-04-25 MED ORDER — TIZANIDINE HCL 2 MG PO TABS
0 refills | Status: CP
Start: 2016-04-25 — End: 2016-05-28

## 2016-04-25 MED ORDER — OXYBUTYNIN CHLORIDE 5 MG PO TABS
3 refills | Status: CP
Start: 2016-04-25 — End: 2016-09-22

## 2016-05-03 ENCOUNTER — Ambulatory Visit: Attending: Internal Medicine | Primary: Internal Medicine

## 2016-05-03 DIAGNOSIS — Z7189 Other specified counseling: Secondary | ICD-10-CM

## 2016-05-03 DIAGNOSIS — Z794 Long term (current) use of insulin: Secondary | ICD-10-CM

## 2016-05-03 DIAGNOSIS — I10 Essential (primary) hypertension: Principal | ICD-10-CM

## 2016-05-03 DIAGNOSIS — E785 Hyperlipidemia, unspecified: Secondary | ICD-10-CM

## 2016-05-03 DIAGNOSIS — K210 Gastro-esophageal reflux disease with esophagitis: Secondary | ICD-10-CM

## 2016-05-03 DIAGNOSIS — M1990 Unspecified osteoarthritis, unspecified site: Secondary | ICD-10-CM

## 2016-05-03 DIAGNOSIS — F419 Anxiety disorder, unspecified: Secondary | ICD-10-CM

## 2016-05-03 DIAGNOSIS — N3941 Urge incontinence: Secondary | ICD-10-CM

## 2016-05-03 DIAGNOSIS — J449 Chronic obstructive pulmonary disease, unspecified: Secondary | ICD-10-CM

## 2016-05-03 DIAGNOSIS — K219 Gastro-esophageal reflux disease without esophagitis: Secondary | ICD-10-CM

## 2016-05-03 DIAGNOSIS — E119 Type 2 diabetes mellitus without complications: Principal | ICD-10-CM

## 2016-05-03 DIAGNOSIS — F329 Major depressive disorder, single episode, unspecified: Secondary | ICD-10-CM

## 2016-05-03 DIAGNOSIS — J3089 Other allergic rhinitis: Secondary | ICD-10-CM

## 2016-05-03 DIAGNOSIS — G309 Alzheimer's disease, unspecified: Secondary | ICD-10-CM

## 2016-05-03 DIAGNOSIS — Z6841 Body Mass Index (BMI) 40.0 and over, adult: Secondary | ICD-10-CM

## 2016-05-03 DIAGNOSIS — F0280 Dementia in other diseases classified elsewhere without behavioral disturbance: Secondary | ICD-10-CM

## 2016-05-03 DIAGNOSIS — E1142 Type 2 diabetes mellitus with diabetic polyneuropathy: Secondary | ICD-10-CM

## 2016-05-03 MED ORDER — MIRABEGRON ER 25 MG PO TB24
1 refills
Start: 2016-05-03 — End: 2016-07-02

## 2016-05-03 MED ORDER — DONEPEZIL HCL 5 MG PO TABS
1 refills | Status: CP
Start: 2016-05-03 — End: 2016-09-22

## 2016-05-03 MED ORDER — PROAIR HFA 108 (90 BASE) MCG/ACT IN AERS
2 | Freq: Four times a day (QID) | RESPIRATORY_TRACT | 2 refills | Status: CP | PRN
Start: 2016-05-03 — End: 2016-08-20

## 2016-05-03 MED ORDER — PRAVASTATIN SODIUM 40 MG PO TABS
1 refills | Status: CP
Start: 2016-05-03 — End: 2016-09-22

## 2016-05-03 MED ORDER — GABAPENTIN 400 MG PO CAPS
1 refills | Status: CP
Start: 2016-05-03 — End: 2016-11-22

## 2016-05-03 MED ORDER — FLUTICASONE PROPIONATE 50 MCG/ACT NA SUSP
2 refills | Status: CP
Start: 2016-05-03 — End: 2016-09-22

## 2016-05-03 MED ORDER — TORSEMIDE 20 MG PO TABS
1 refills | Status: CP
Start: 2016-05-03 — End: 2016-09-22

## 2016-05-03 MED ORDER — HYDROCHLOROTHIAZIDE 25 MG PO TABS
1 refills | Status: CP
Start: 2016-05-03 — End: 2016-09-22

## 2016-05-03 MED ORDER — LISINOPRIL 10 MG PO TABS
1 refills | Status: CP
Start: 2016-05-03 — End: 2016-11-06

## 2016-05-03 MED ORDER — RANITIDINE HCL 150 MG PO TABS
1 refills | Status: CP
Start: 2016-05-03 — End: 2016-09-22

## 2016-05-03 MED ORDER — ESOMEPRAZOLE MAGNESIUM 40 MG PO CPDR
1 refills | Status: CP
Start: 2016-05-03 — End: 2017-02-05

## 2016-05-10 NOTE — Progress Notes
?   fluticasone-salmeterol (ADVAIR DISKUS) 250-50 MCG/DOSE Aerosol Powder Breath Activated Inhale 1 puff 2 times daily. Rinse mouth well and spit after each use.   ? GLOBAL INJECT EASE INSULIN SYR 31G X 5/16" 1 ML Miscellaneous USE ONE TWICE A DAY      .[name].THANK YOU..   ? insulin lispro protamine-lispro (HumaLOG 75/25) vial 50 units qam 35 min before breakfast and 38 unit qpm 30 min before supper   ? meclizine (ANTIVERT) 25 MG Tablet TAKE ONE OR TWO TABLETS  EVERY EIGHT HOURS .[name].THANKYOU..   ? nystatin (MYCOSTATIN) 100000 UNIT/ML Suspension Take 5 mLs by mouth 4 times daily.   ? oxybutynin (DITROPAN) 5 MG Tablet TAKE TWO TABLETS BY MOUTHTWICE A DAY .[name].THANK YOU.[name].   ? tiZANidine (ZANAFLEX) 2 MG Tablet TAKE TWO TABLETS EVERY   EIGHT HOURS (ONLY WHEN   NEEDED) ##THANK YOU##   ? VITAMIN D-2 50000 UNITS Capsule TAKE ONE CAPSULE BY MOUTHTWO TIMES PER WEEK FOR 6 WEEKS .[name].THANK YOU.[name].     No current facility-administered medications on file prior to visit.      Allergies   Allergen Reactions   ? Asa [Aspirin] Itching   ? Pcn [Penicillins] Itching   ? Sulfa Drugs Itching         Review of Systems  Review of Systems   Constitutional: Positive for fatigue. Negative for chills and fever.   HENT: Positive for congestion and postnasal drip. Negative for rhinorrhea and sinus pressure.    Eyes: Negative.    Respiratory: Positive for cough. Negative for wheezing.    Cardiovascular: Positive for leg swelling. Negative for chest pain and palpitations.        Both legs   Gastrointestinal: Negative.  Negative for abdominal pain, constipation, diarrhea, nausea and vomiting.   Endocrine: Negative.    Genitourinary: Positive for enuresis.   Musculoskeletal: Positive for arthralgias and myalgias.   Skin: Negative.    Allergic/Immunologic: Negative.    Neurological: Negative.    Hematological: Negative.    Psychiatric/Behavioral: Positive for decreased concentration.         Objective:        VITAL SIGNS (all recorded)      Clinic Vitals

## 2016-05-10 NOTE — Progress Notes
Sig: SPRAY ONE PUFF INTO NOSE TWICE A DAY ==SHAKE      GENTLY==     Dispense:  16 g     Refill:  2     NEEDS REFILLS   ? PROAIR HFA 108 (90 BASE) MCG/ACT Aerosol Solution     Sig: Inhale 2 puffs every 6 hours as needed.     Dispense:  8.5 g     Refill:  2     Orders Placed This Encounter   Procedures   ? Comprehensive Metabolic Panel   ? Hemoglobin A1c w/EAG   ? Microalbumin/Creat Urine Ratio   Teaching re:  DM: untreated elevations of blood sugar can lead to problems with eyes, nerves, kidneys, heart, blood vessels, and integrity of feet.  Discussed pathophysiology of pancreas, insulin, insulin resistance, effect of obesity and smoking, concept of syndrome X  Importance of monitoring blood sugars and recording results is discussed at length.  Patient has materials for keeping records and for measuring.  Educational materials regarding diabetes is given to patient.   Patient is made aware of the importance of monitoring HgbA1c every three to six months, having a yearly dilated eye exam, checking feet regularly for damage to the skin, monitoring cholesterol, seeing an Opthalmologist yearly and maintaining a diet consistent with diabetes care.  Patient given educational material regarding diabetes.      Health Maintenance was reviewed. The patient's HM Topic list was:                                            Health Maintenance   Topic Date Due   ? Preventive Wellness Visit  06/04/2016 (Originally 11/16/1963)   ? Colon Cancer Screening  07/03/2016 (Originally 11/16/1993)   ? Diabetic Eye Exam  08/17/2016   ? Diabetic Foot Exam  10/23/2016   ? Hemoglobin A1C  11/01/2016   ? Lipid Profile  02/06/2017   ? Urine Protein  05/03/2017   ? Creatinine  05/03/2017   ? Basic Metabolic Panel  05/03/2017   ? Breast Cancer Screening  10/16/2017   ? Dexa  04/18/2025   ? USPSTF Hepatitis C Screening  Completed   ? Influenza Vaccine  Addressed   ? DTaP,Tdap,and Td Vaccines  Excluded   ? Pneumovax / Prevnar  Excluded

## 2016-05-10 NOTE — Progress Notes
Reason:  Physician ordered labs  Amount:  2 gold, 1 lavender, urine Tubes  Type:  Butterfly and Syringe  Site:  left Hand  Reaction:  None    Draw performed by:  [id number],  05/03/2016

## 2016-05-10 NOTE — Progress Notes
05/03/16 1338             Amb Encounter Vitals    Weight 113.8 kg (250 lb 12.8 oz)    -TD at 05/03/16 1339       Height 1.626 m (5\' 4" )    -TD at 05/03/16 1339       BMI (Calculated) 43.14    -TD at 05/03/16 1339       BSA (Calculated - sq m) 2.27    -TD at 05/03/16 1339       BP 110/64    -TD at 05/03/16 1339       BP Location Left upper arm    -TD at 05/03/16 1339       Position Sitting    -TD at 05/03/16 1339       Pulse 90    -TD at 05/03/16 1339       Pulse Source Radial    -TD at 05/03/16 1339       Resp 16    -TD at 05/03/16 1339       Temp 37.3 ?C (99.1 ?F)    -TD at 05/03/16 1339       Temperature Source Tympanic    -TD at 05/03/16 1339       O2 Saturation 98 %    -TD at 05/03/16 1339       FiO2 Source RA    -TD at 05/03/16 1339       Pain Score Zero    -TD at 05/03/16 1339       Education/Communication Barriers?    Learning/Communication Barriers? No    -TD at 05/03/16 1339       Fall Risk Assessment    Had recent fall / Last 6 months? No recent fall    -TD at 05/03/16 1339       Does patient have a fear of falling? No    -TD at 05/03/16 1339         User Key  (r) = Recorded By, (t) = Taken By, (c) = Cosigned By    Initials Name Effective Dates    TD Duffy, Tammy, MA 10/24/15 -         Physical Exam   Constitutional: She is oriented to person, place, and time. She appears well-developed and well-nourished.   HENT:   Head: Normocephalic and atraumatic.   Right Ear: External ear normal.   Left Ear: External ear normal.   Nose: Mucosal edema and rhinorrhea present.   Mouth/Throat: Oropharynx is clear and moist.   Eyes: Conjunctivae and EOM are normal. Pupils are equal, round, and reactive to light.   Neck: Normal range of motion. Neck supple.   Cardiovascular: Normal rate, regular rhythm, normal heart sounds and intact distal pulses.    Pulmonary/Chest: Effort normal. She has decreased breath sounds.   Abdominal: Soft. Bowel sounds are normal. There is tenderness in the suprapubic area.

## 2016-05-10 NOTE — Progress Notes
?   Zoster Vaccine  Excluded

## 2016-05-10 NOTE — Progress Notes
Musculoskeletal: Normal range of motion. She exhibits edema and tenderness.   Allover joints, 2+ edema both legs   Neurological: She is alert and oriented to person, place, and time. She has normal reflexes.   Skin: Skin is warm and dry.   Psychiatric: She has a normal mood and affect. Her behavior is normal. Judgment and thought content normal. Cognition and memory are impaired. She exhibits abnormal recent memory.   Nursing note and vitals reviewed.  Foot Exam: foot deformity Pes Planus bilaterally      Assessment:       ICD-10-CM ICD-9-CM    1. Essential hypertension I10 401.9 lisinopril (PRINIVIL,ZESTRIL) 10 MG Tablet      hydroCHLOROthiazide (HYDRODIURIL) 25 MG Tablet      torsemide (DEMADEX) 20 MG Tablet      Comprehensive Metabolic Panel      CBC and Differential      Comprehensive Metabolic Panel      CBC and Differential   2. Urge incontinence of urine N39.41 788.31 mirabegron (MYRBETRIQ) 25 MG Tablet Extended Release 24 Hour   3. Gastroesophageal reflux disease with esophagitis K21.0 530.11 esomeprazole (NexIUM) 40 MG Capsule Delayed Release      raNITIdine (ZANTAC) 150 MG Tablet   4. Type 2 diabetes mellitus with diabetic polyneuropathy, with long-term current use of insulin E11.42 250.60 gabapentin (NEURONTIN) 400 MG Capsule    Z79.4 357.2 Comprehensive Metabolic Panel     V58.67 CBC and Differential      Hemoglobin A1c w/EAG      Microalbumin/Creat Urine Ratio      Comprehensive Metabolic Panel      CBC and Differential      Hemoglobin A1c w/EAG      Microalbumin/Creat Urine Ratio   5. Hyperlipidemia, unspecified hyperlipidemia type E78.5 272.4 pravastatin (PRAVACHOL) 40 MG Tablet   6. Dementia in Alzheimer's disease G30.9 331.0 donepezil (ARICEPT) 5 MG Tablet    F02.80 294.10    7. Other allergic rhinitis J30.89 477.8 fluticasone (FLONASE) 50 MCG/ACT Suspension   8. Chronic obstructive pulmonary disease, unspecified COPD type J44.9 496 PROAIR HFA 108 (90 BASE) MCG/ACT Aerosol Solution

## 2016-05-10 NOTE — Progress Notes
9. Advanced directives, counseling/discussion Z71.89 V65.49    10. BMI 40.0-44.9, adult Z68.41 V85.41           Plan:   increase insulin by 2 units on am and 2 units on pm nd monitor BS  CT for lung nodule  Continue current meds.  Diet and exercise as long patient can tolerate.  Salt restrictions.  FU with other MDs as scheduled.  Meds benefits and side effects discussed with patient. Patient sounds understanding.  Orders Placed This Encounter   Medications   ? lisinopril (PRINIVIL,ZESTRIL) 10 MG Tablet     Sig: TAKE ONE TABLET EVERY DAY.[name].THANK YOU.[name].     Dispense:  90 tablet     Refill:  1     NEEDS REFILLS   ? hydroCHLOROthiazide (HYDRODIURIL) 25 MG Tablet     Sig: TAKE ONE TABLET EVERY DAYFOR FLUIDS .[name].THANK YOU.[name].     Dispense:  90 tablet     Refill:  1     NEEDS REFILL   ? torsemide (DEMADEX) 20 MG Tablet     Sig: TAKE ONE TABLET EVERY    MORNING ##THANK YOU##     Dispense:  90 tablet     Refill:  1     NEEDS REFILL   ? esomeprazole (NexIUM) 40 MG Capsule Delayed Release     Sig: TAKE ONE CAPSULE EVERY   DAY .[name].THANK YOU.[name].     Dispense:  90 capsule     Refill:  1     NEEDS REFILLS   ? raNITIdine (ZANTAC) 150 MG Tablet     Sig: TAKE ONE TABLET TWICE A  DAY ##THANK YOU##     Dispense:  180 tablet     Refill:  1   ? gabapentin (NEURONTIN) 400 MG Capsule     Sig: TAKE 2 CAPSULES BY MOUTH 3 TIMES DAILY.     Dispense:  540 capsule     Refill:  1     NEEDS REFILLS   ? pravastatin (PRAVACHOL) 40 MG Tablet     Sig: TAKE ONE TABLET AT       BEDTIME .[name].THANK YOU.[name].     Dispense:  90 tablet     Refill:  1   ? mirabegron (MYRBETRIQ) 25 MG Tablet Extended Release 24 Hour     Sig: TAKE ONE TABLET BY MOUTH AT BEDTIME .[name].THANK YOU.[name].     Dispense:  90 tablet     Refill:  1     NEEDS REFILLS   ? donepezil (ARICEPT) 5 MG Tablet     Sig: TAKE ONE TABLET BY MOUTH AT BEDTIME .[name].THANK YOU.[name].     Dispense:  90 tablet     Refill:  1     NEEDS REFILLS   ? fluticasone (FLONASE) 50 MCG/ACT Suspension

## 2016-05-10 NOTE — Progress Notes
Subjective:   Destiny RedwoodMary Pittman is a 70 y.o. female being seen today for Follow-up (45mo.) and Medications Refill       HPI  patient is complaining from nasal drainage and postnasal drip for few days, secondary to rhinitis, on and off. She has insulin dependent DM, she is fasting today to get her lab done, still not complient with diet or meds, home sugars level still flactuate. she is adherent to low salt diet and complient with current antihypertensive medicine. Blood pressure is well controlled at home.  Patient denies: chest pressure/discomfort, palpitations, irregular heart beats, near-syncope, syncope. Still has swelling of both legs ges worse by the days, chronic leg edema, mostly secondary to chronic venous stasis, refuse to use compression stuckings. GERD, COPD and dyslipidemia are well controlled.  Past Medical History:   Diagnosis Date   ? Anxiety    ? Arthritis    ? Depression    ? Diabetes mellitus    ? GERD (gastroesophageal reflux disease)    ? Hyperlipidemia    ? Hypertension      Past Surgical History:   Procedure Laterality Date   ? HYSTERECTOMY       Family History   Problem Relation Age of Onset   ? No Known Problems Mother    ? No Known Problems Father      Social History     Social History   ? Marital status: Single     Spouse name: N/A   ? Number of children: N/A   ? Years of education: N/A     Occupational History   ? Not on file.     Social History Main Topics   ? Smoking status: Never Smoker   ? Smokeless tobacco: Never Used   ? Alcohol use No   ? Drug use: No   ? Sexual activity: No     Other Topics Concern   ? Not on file     Social History Narrative     Current Outpatient Prescriptions on File Prior to Visit   Medication Sig   ? ALPRAZolam (XANAX) 0.5 MG tablet Take 1 Tablet by mouth 2 times daily.   ? diclofenac (VOLTAREN GEL) 1 % Gel topical gel APPLY 4 GM TOPICALLY FOURTIMES A DAY **MEASURE    DOSE USING ENCLOSED DOSE CARD*

## 2016-05-21 ENCOUNTER — Ambulatory Visit
Admission: RE | Admit: 2016-05-21 | Discharge: 2016-05-21 | Disposition: A | Discharge status: Home / Self Care | Payer: Medicare Other | Source: Ambulatory Visit | Attending: Internal Medicine | Admitting: Internal Medicine

## 2016-05-21 ENCOUNTER — Encounter: Payer: Self-pay | Admitting: Internal Medicine

## 2016-05-21 ENCOUNTER — Ambulatory Visit (INDEPENDENT_AMBULATORY_CARE_PROVIDER_SITE_OTHER): Payer: Medicare Other | Admitting: Internal Medicine

## 2016-05-21 VITALS — BP 118/74 | HR 103 | Temp 99.2°F | Wt 127.0 lb

## 2016-05-21 DIAGNOSIS — R0600 Dyspnea, unspecified: Secondary | ICD-10-CM

## 2016-05-21 DIAGNOSIS — J189 Pneumonia, unspecified organism: Secondary | ICD-10-CM | POA: Diagnosis present

## 2016-05-21 DIAGNOSIS — R0602 Shortness of breath: Secondary | ICD-10-CM

## 2016-05-21 MED ORDER — ALBUTEROL SULFATE (2.5 MG/3ML) 0.083% IN NEBU: 2.5000 mg | INHALATION_SOLUTION | RESPIRATORY_TRACT | 12 refills | Status: DC | PRN

## 2016-05-21 MED ORDER — METHYLPREDNISOLONE ACETATE 80 MG/ML IJ SUSP
120.0000 mg | Freq: Once | INTRAMUSCULAR | Status: AC
  Administered 2016-05-21: 120 mg via INTRAMUSCULAR

## 2016-05-21 MED ORDER — AMOXICILLIN-POT CLAVULANATE 875-125 MG PO TABS: 1.0000 | ORAL_TABLET | Freq: Two times a day (BID) | ORAL | 0 refills | Status: DC

## 2016-05-21 MED ORDER — GUAIFENESIN-CODEINE 100-10 MG/5ML PO SOLN: 5.0000 mL | ORAL | 0 refills | Status: DC | PRN

## 2016-05-21 MED ORDER — PREDNISONE 20 MG PO TABS: 40.0000 mg | ORAL_TABLET | Freq: Every day | ORAL | 0 refills | Status: DC

## 2016-05-21 MED ORDER — IPRATROPIUM-ALBUTEROL 0.5-2.5 (3) MG/3ML IN SOLN
3.0000 mL | Freq: Once | RESPIRATORY_TRACT | Status: AC
  Administered 2016-05-21: 3 mL via RESPIRATORY_TRACT

## 2016-05-21 NOTE — Addendum Note (Signed)
Addended by: Oscar La R on: 05/21/2016 12:52 PM   Modules accepted: Orders

## 2016-05-21 NOTE — Addendum Note (Signed)
Addended by: Oscar La R on: 05/21/2016 12:58 PM   Modules accepted: Orders

## 2016-05-21 NOTE — Patient Instructions (Signed)

## 2016-05-21 NOTE — Progress Notes (Signed)
Anna [facility]    Date: 05/21/2016  MRN# [id number] [facility] 1946-03-06  Referring Physician: Dr. Jeb Levering PMD - Dr. Gilford [facility]) Anna [facility] is a 70 y.o. old female seen in [place] for recurrent pulmonary infection  CC:  Chief Complaint  Patient presents with  . 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  CT chest 09/05/15 shows stable RUL infiltrate      Review of Chart ONCOLOGY History 1. 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 Status post 6 cycles of chemotherapy with CHOP9nd involved field radiation treatment 18,00 rads to the mantle field and mediasinal boost 2340. finished radiation and chemotherapy in November of 2000 residual mediastinotomy mass 2 cm 2. In May of 2001 progressive disease with mediastinal mass becoming bigger 8 cm3. The patient underwent 2 cycles of chemotherapy withRituxan, and ICE.(June of 2001) Followed by 58more cycles at [facility] with DHAP.and high-dose chemotherapy with BEAM finished treatment in October of 2001. patient had significant pancytopenia as well as fever lasting for 3 months after transplant. Recurrent sinus infection and pulmonary infection 3. Patient underwent EUS biopsy of which was negative . (November, 2012) 4. Recurrent diffuse large cell lymphoma from the right side of  the neck lymph node biopsy done in October of 2014 5. 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 Negative for chills, fever and weight loss.  HENT: Positive for congestion.   Eyes: Negative for blurred vision and double vision.  Respiratory: Positive for cough, sputum production, shortness of breath and wheezing.   Cardiovascular: Negative for chest pain, palpitations, orthopnea and leg swelling.  Gastrointestinal: Negative for abdominal pain, heartburn, nausea and vomiting.  Neurological: Negative for dizziness.  Psychiatric/Behavioral: Negative for depression.  All other systems reviewed and are negative.    Physical Examination:   BP 118/74 (BP Location: Right Arm, Cuff Size: Normal)   Pulse (!) 103   Temp 99.2 F (37.3 C) (Oral)   Wt 127 lb (57.6 kg)   SpO2 95%   BMI 22.50 kg/m   General Appearance: + distress  Neuro:without focal findings, mental status, speech normal, alert and oriented, cranial nerves 2-12 intact, reflexes normal and symmetric, sensation grossly normal  HEENT: PERRLA, EOM intact, no ptosis, no other lesions noticed, right ear canal with no sig erythema, could not visualize the TM due to cerumen impaction ; Mallampati 2 Pulmonary: normal breath sounds., diaphragmatic excursion normal.+wheezing, No rales;   +Sputum Production:   CardiovascularNormal S1,S2.  No m/r/g.  Abdominal aorta pulsation normal.    Skin:   warm, no rashes, no ecchymosis  Extremities: normal,  no cyanosis, clubbing, no edema, warm with normal capillary refill. Other findings:none   CT chest 09/05/15  IMPRESSION: 1. Perihilar consolidation  which extends into the RIGHT upper lobe with chronic bronchiectasis is most consistent with radiation change. 2. Ill-defined tissue planes in the mediastinum without clear evidence of nodal metastasis. 3. Some improvement in nodular airspace disease in the RIGHT lower lobe suggests improving chronic infectious process. 4. No convincing evidence of lung cancer recurrence.     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 with acute pneumonia/bronchitis  1.will give IM injection of DepoMedrol 120 mg 2.start oral prednisone 40 mg daily for 10 days 3.will give dounebs in office now 4.will prescribe albuterol nebs every 4 hrs 5.augmentin 875 mg BID 10 days 6.obtain sputum cultures 7.obtain cxr 2 view 8.robitussin with codeine  Follow up in 1 week    Has a history of diffuse large B-cell lymphoma, chronic infiltrates suspected to be from radiation pneumonitis, -Continue with Advair -Continue supplemental oxygen as needed -albuterol as needed   The Patient requires high complexity decision making for assessment and support, frequent evaluation and titration of therapies.  Patient/Family are satisfied with Plan of action and management. All questions answered  [name], M.D.  [facility]  Medical Director [place] Director [facility] Cardio-Pulmonary Department

## 2016-05-22 ENCOUNTER — Telehealth: Payer: Self-pay | Admitting: Internal Medicine

## 2016-05-22 NOTE — Telephone Encounter (Signed)
Called and spoke with [employer] and [employer] did receive the order yesterday and is working on getting device approved and delivered to her today.  Explained to [place] that patient really needs this today, sooner vs later. She assured me that patient would be contact today and device delivered today. Rhonda J Cobb

## 2016-05-22 NOTE — Telephone Encounter (Signed)
Please advise from [place] where we are with the Neb machine please. Thanks

## 2016-05-22 NOTE — Telephone Encounter (Signed)
Spoke with husband and informed him that Suanne Marker has contacted [employer] and that neb machine will be delivered today per [employer]. Informed him if they haven't heard from [place] by 2pm to please give [place] a call. Husband verbalized understanding. Nothing further needed.

## 2016-05-22 NOTE — Telephone Encounter (Signed)
Pt spouse is calling states pt was to get a breathing machine from [place], and has not heard from them yet. Please call and advise.

## 2016-05-23 ENCOUNTER — Inpatient Hospital Stay: Payer: Medicare Other

## 2016-05-23 ENCOUNTER — Inpatient Hospital Stay (HOSPITAL_BASED_OUTPATIENT_CLINIC_OR_DEPARTMENT_OTHER): Payer: Medicare Other | Admitting: Internal Medicine

## 2016-05-23 ENCOUNTER — Inpatient Hospital Stay: Payer: Medicare Other | Attending: Internal Medicine

## 2016-05-23 VITALS — BP 108/70 | HR 86 | Temp 96.0°F | Resp 18 | Wt 122.0 lb

## 2016-05-23 DIAGNOSIS — Z862 Personal history of diseases of the blood and blood-forming organs and certain disorders involving the immune mechanism: Secondary | ICD-10-CM | POA: Diagnosis not present

## 2016-05-23 DIAGNOSIS — Z9221 Personal history of antineoplastic chemotherapy: Secondary | ICD-10-CM | POA: Diagnosis not present

## 2016-05-23 DIAGNOSIS — Z7952 Long term (current) use of systemic steroids: Secondary | ICD-10-CM

## 2016-05-23 DIAGNOSIS — J9610 Chronic respiratory failure, unspecified whether with hypoxia or hypercapnia: Secondary | ICD-10-CM | POA: Insufficient documentation

## 2016-05-23 DIAGNOSIS — Z9484 Stem cells transplant status: Secondary | ICD-10-CM

## 2016-05-23 DIAGNOSIS — Z79899 Other long term (current) drug therapy: Secondary | ICD-10-CM | POA: Insufficient documentation

## 2016-05-23 DIAGNOSIS — J918 Pleural effusion in other conditions classified elsewhere: Secondary | ICD-10-CM | POA: Diagnosis not present

## 2016-05-23 DIAGNOSIS — Z923 Personal history of irradiation: Secondary | ICD-10-CM

## 2016-05-23 DIAGNOSIS — Z9981 Dependence on supplemental oxygen: Secondary | ICD-10-CM | POA: Diagnosis not present

## 2016-05-23 DIAGNOSIS — Z8701 Personal history of pneumonia (recurrent): Secondary | ICD-10-CM | POA: Insufficient documentation

## 2016-05-23 DIAGNOSIS — Z7982 Long term (current) use of aspirin: Secondary | ICD-10-CM | POA: Insufficient documentation

## 2016-05-23 DIAGNOSIS — Z8572 Personal history of non-Hodgkin lymphomas: Secondary | ICD-10-CM | POA: Insufficient documentation

## 2016-05-23 DIAGNOSIS — C8590 Non-Hodgkin lymphoma, unspecified, unspecified site: Secondary | ICD-10-CM

## 2016-05-23 DIAGNOSIS — Z95828 Presence of other vascular implants and grafts: Secondary | ICD-10-CM

## 2016-05-23 DIAGNOSIS — J45909 Unspecified asthma, uncomplicated: Secondary | ICD-10-CM | POA: Insufficient documentation

## 2016-05-23 DIAGNOSIS — C8330 Diffuse large B-cell lymphoma, unspecified site: Secondary | ICD-10-CM

## 2016-05-23 LAB — COMPREHENSIVE METABOLIC PANEL
ALBUMIN: 3.6 g/dL (ref 3.5–5.0)
ALK PHOS: 57 U/L (ref 38–126)
ALT: 12 U/L — AB (ref 14–54)
AST: 17 U/L (ref 15–41)
Anion gap: 9 (ref 5–15)
BILIRUBIN TOTAL: 0.4 mg/dL (ref 0.3–1.2)
BUN: 42 mg/dL — AB (ref 6–20)
CALCIUM: 8.9 mg/dL (ref 8.9–10.3)
CO2: 25 mmol/L (ref 22–32)
Chloride: 100 mmol/L — ABNORMAL LOW (ref 101–111)
Creatinine, Ser: 1.17 mg/dL — ABNORMAL HIGH (ref 0.44–1.00)
GFR calc Af Amer: 53 mL/min — ABNORMAL LOW (ref >60)
GFR calc non Af Amer: 45 mL/min — ABNORMAL LOW (ref >60)
GLUCOSE: 127 mg/dL — AB (ref 65–99)
Potassium: 3.1 mmol/L — ABNORMAL LOW (ref 3.5–5.1)
Sodium: 134 mmol/L — ABNORMAL LOW (ref 135–145)
TOTAL PROTEIN: 7.9 g/dL (ref 6.5–8.1)

## 2016-05-23 LAB — CBC WITH DIFFERENTIAL/PLATELET
BASOS ABS: 0 10*3/uL (ref 0–0.1)
BASOS PCT: 0 %
Eosinophils Absolute: 0 10*3/uL (ref 0–0.7)
Eosinophils Relative: 0 %
HEMATOCRIT: 35.5 % (ref 35.0–47.0)
HEMOGLOBIN: 12.1 g/dL (ref 12.0–16.0)
Lymphocytes Relative: 6 %
Lymphs Abs: 0.7 10*3/uL — ABNORMAL LOW (ref 1.0–3.6)
MCH: 33.3 pg (ref 26.0–34.0)
MCHC: 34.1 g/dL (ref 32.0–36.0)
MCV: 97.8 fL (ref 80.0–100.0)
MONOS PCT: 6 %
Monocytes Absolute: 0.8 10*3/uL (ref 0.2–0.9)
NEUTROS ABS: 11 10*3/uL — AB (ref 1.4–6.5)
NEUTROS PCT: 88 %
Platelets: 253 10*3/uL (ref 150–440)
RBC: 3.63 MIL/uL — AB (ref 3.80–5.20)
RDW: 13.7 % (ref 11.5–14.5)
WBC: 12.6 10*3/uL — AB (ref 3.6–11.0)

## 2016-05-23 MED ORDER — SODIUM CHLORIDE 0.9% FLUSH
10.0000 mL | INTRAVENOUS | Status: DC | PRN
  Administered 2016-05-23: 10 mL via INTRAVENOUS
  Filled 2016-05-23: qty 10

## 2016-05-23 MED ORDER — ONDANSETRON HCL 4 MG PO TABS: 4.0000 mg | ORAL_TABLET | Freq: Three times a day (TID) | ORAL | 1 refills | Status: DC | PRN

## 2016-05-23 MED ORDER — HEPARIN SOD (PORK) LOCK FLUSH 100 UNIT/ML IV SOLN
500.0000 [IU] | Freq: Once | INTRAVENOUS | Status: AC
  Administered 2016-05-23: 500 [IU] via INTRAVENOUS

## 2016-05-23 NOTE — Progress Notes (Signed)
[facility] OFFICE PROGRESS NOTE  Patient Care Team: Madelyn Brunner, MD as PCP - General (Unknown Physician Specialty) Madelyn Brunner, MD as Referring Physician (Unknown Physician Specialty) Forest Gleason, MD (Unknown Physician Specialty) Christene Lye, MD (General Surgery)  No matching staging information was found for the patient.   Oncology History   1.  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  Status post 6 cycles of chemotherapy with CHOP9nd involved field radiation treatment 18,00 rads to the mantle field  and mediasinal boost 2340. finished radiation and chemotherapy in November of 2000 residual mediastinotomy mass 2 cm 2.  In May of 2001 progressive disease with mediastinal mass becoming bigger 8 cm3.  The patient underwent 2 cycles of chemotherapy withRituxan, and ICE.(June of 2001) Followed by 24more cycles  at [facility] with DHAP.and high-dose chemotherapy with  BEAM finished treatment in October of 2001. patient had significant pancytopenia as well as fever lasting for 3 months after transplant. Recurrent sinus infection and pulmonary infection 3.  Patient  underwent EUS biopsy of which was negative .  (November, 2012) 4.  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  Started on RICE october 2014 6.ppatient is now being considered for high-dose chemotherapy and stem cell support (March, 2015) 7.2nd stem cell transplant cannot be done because of the  no stem cell to harvest     Lymphoma, non-Hodgkin's ([place])   06/30/2013 Initial Diagnosis    Lymphoma, non-Hodgkin's       DLBCL (diffuse large B cell lymphoma) ([place])   02/21/2016 Initial Diagnosis    DLBCL (diffuse large B cell lymphoma) ([place])      INTERVAL HISTORY:  Anna Mcbride 70 y.o.  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  Patient noted to have worsening shortness of breath with cough and no hemoptysis. Positive for sputum. Patient had seen pulmonary 2 days ago had a chest x-ray. Started on prednisone and also amoxicillin. Continues to be on oxygen.  No new lumps or bumps. No nausea or vomiting. No significant weight loss.  REVIEW OF SYSTEMS:  A complete 10 point review of system is done which is negative except mentioned above/history of present illness.   PAST MEDICAL HISTORY :  Past Medical History:  Diagnosis Date  . Acute respiratory failure ([place]) 11/29/2014  . 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 Asthma   . Non Hodgkin's lymphoma ([place])     PAST SURGICAL HISTORY :   Past Surgical History:  Procedure Laterality Date  . 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 NASAL SINUS SURGERY  08/07/05 09/09/05   x 2   . VIDEO BRONCHOSCOPY Bilateral 11/30/2014   Procedure: VIDEO BRONCHOSCOPY WITHOUT FLUORO;  Surgeon: Flora Lipps, MD;  Location: [facility] ORS;  Service: Cardiopulmonary;  Laterality: Bilateral;    FAMILY HISTORY :   Family History  Problem Relation Age of Onset  . Asthma Son     SOCIAL HISTORY:   Social History  Substance Use Topics  . Smoking status: Never Smoker  . Smokeless tobacco: Never Used  . Alcohol use Yes    ALLERGIES:  is allergic to hydrocodone-homatropine; tussionex pennkinetic er [hydrocod polst-cpm polst er]; and ciprofloxacin.  MEDICATIONS:  Current Outpatient Prescriptions  Medication Sig  Dispense Refill  . albuterol (PROVENTIL HFA;VENTOLIN HFA) 108 (90 BASE) MCG/ACT inhaler Inhale 1 puff into the lungs every 6 (six) hours as needed for wheezing or shortness of breath.    [name] albuterol (PROVENTIL) (2.5 MG/3ML) 0.083% nebulizer solution Take 3 mLs (2.5 mg total) by nebulization every 4 (four) hours as needed for wheezing or shortness of breath. 75 mL 12  . amoxicillin-clavulanate (AUGMENTIN)  875-125 MG tablet Take 1 tablet by mouth 2 (two) times daily. 20 tablet 0  . aspirin EC 81 MG tablet Take 81 mg by mouth daily.    . citalopram (CELEXA) 20 MG tablet Take 1 tablet (20 mg total) by mouth daily. 30 tablet 3  . fluticasone-salmeterol (ADVAIR HFA) 115-21 MCG/ACT inhaler Inhale 2 puffs into the lungs 2 (two) times daily. 1 Inhaler 12  . guaiFENesin-codeine 100-10 MG/5ML syrup Take 5 mLs by mouth every 4 (four) hours as needed for cough. 360 mL 0  . levothyroxine (SYNTHROID, LEVOTHROID) 25 MCG tablet Take 1 tablet (25 mcg total) by mouth daily. 30 tablet 3  . lidocaine-prilocaine (EMLA) cream Apply 1 application topically as needed. 30 g 3  . omeprazole (PRILOSEC) 20 MG capsule Take 1 capsule (20 mg total) by mouth 2 (two) times daily before a meal. 60 capsule 3  . ondansetron (ZOFRAN) 4 MG tablet Take 1 tablet (4 mg total) by mouth every 8 (eight) hours as needed for nausea or vomiting. 30 tablet 1  . potassium chloride SA (K-DUR,KLOR-CON) 20 MEQ tablet TAKE 1 TABLET BY MOUTH TWICE DAILY. 30 tablet 2  . predniSONE (DELTASONE) 20 MG tablet Take 2 tablets (40 mg total) by mouth daily with breakfast. 20 tablet 0   No current facility-administered medications for this visit.    Facility-Administered Medications Ordered in Other Visits  Medication Dose Route Frequency Provider Last Rate Last Dose  . sodium chloride flush (NS) 0.9 % injection 10 mL  10 mL Intravenous PRN Cammie Sickle, MD   10 mL at 05/23/16 1006    PHYSICAL EXAMINATION: ECOG PERFORMANCE STATUS: 1 - Symptomatic but completely ambulatory  BP 108/70 (BP Location: Right Arm, Patient Position: Sitting)   Pulse 86   Temp (!) 96 F (35.6 C) (Tympanic)   Resp 18   Wt 122 lb (55.3 kg)   BMI 21.61 kg/m   Filed Weights   05/23/16 1130  Weight: 122 lb (55.3 kg)    GENERAL: Thin built moderately nourished on nasal cannula of oxygen; Alert, no distress and comfortable.   Accompanied by husband. EYES: no pallor or  icterus OROPHARYNX: no thrush or ulceration; good dentition  NECK: supple, no masses felt LYMPH:  no palpable lymphadenopathy in the cervical, axillary or inguinal regions LUNGS: Bilateral coarse breath sounds to auscultation and  No wheeze or crackles HEART/CVS: regular rate & rhythm and no murmurs; No lower extremity edema ABDOMEN:abdomen soft, non-tender and normal bowel sounds Musculoskeletal:no cyanosis of digits and no clubbing  PSYCH: alert & oriented x 3 with fluent speech NEURO: no focal motor/sensory deficits SKIN:  no rashes or significant lesions  LABORATORY DATA:  I have reviewed the data as listed    Component Value Date/Time   NA 134 (L) 05/23/2016 0950   NA 140 11/16/2014 1415   K 3.1 (L) 05/23/2016 0950   K 3.3 (L) 11/16/2014 1415   CL 100 (L) 05/23/2016 0950   CL 107 11/16/2014 1415   CO2 25 05/23/2016 0950   CO2 29 11/16/2014 1415   GLUCOSE 127 (H)  05/23/2016 0950   GLUCOSE 117 (H) 11/16/2014 1415   BUN 42 (H) 05/23/2016 0950   BUN 31 (H) 11/16/2014 1415   CREATININE 1.17 (H) 05/23/2016 0950   CREATININE 1.19 (H) 11/16/2014 1415   CALCIUM 8.9 05/23/2016 0950   CALCIUM 9.1 11/16/2014 1415   PROT 7.9 05/23/2016 0950   PROT 6.8 11/16/2014 1415   ALBUMIN 3.6 05/23/2016 0950   ALBUMIN 3.8 11/16/2014 1415   AST 17 05/23/2016 0950   AST 17 11/16/2014 1415   ALT 12 (L) 05/23/2016 0950   ALT 11 (L) 11/16/2014 1415   ALKPHOS 57 05/23/2016 0950   ALKPHOS 115 11/16/2014 1415   BILITOT 0.4 05/23/2016 0950   BILITOT 0.4 11/16/2014 1415   GFRNONAA 45 (L) 05/23/2016 0950   GFRNONAA 46 (L) 11/16/2014 1415   GFRAA 53 (L) 05/23/2016 0950   GFRAA 54 (L) 11/16/2014 1415    No results found for: SPEP, UPEP  Lab Results  Component Value Date   WBC 12.6 (H) 05/23/2016   NEUTROABS 11.0 (H) 05/23/2016   HGB 12.1 05/23/2016   HCT 35.5 05/23/2016   MCV 97.8 05/23/2016   PLT 253 05/23/2016      Chemistry      Component Value Date/Time   NA 134 (L) 05/23/2016  0950   NA 140 11/16/2014 1415   K 3.1 (L) 05/23/2016 0950   K 3.3 (L) 11/16/2014 1415   CL 100 (L) 05/23/2016 0950   CL 107 11/16/2014 1415   CO2 25 05/23/2016 0950   CO2 29 11/16/2014 1415   BUN 42 (H) 05/23/2016 0950   BUN 31 (H) 11/16/2014 1415   CREATININE 1.17 (H) 05/23/2016 0950   CREATININE 1.19 (H) 11/16/2014 1415      Component Value Date/Time   CALCIUM 8.9 05/23/2016 0950   CALCIUM 9.1 11/16/2014 1415   ALKPHOS 57 05/23/2016 0950   ALKPHOS 115 11/16/2014 1415   AST 17 05/23/2016 0950   AST 17 11/16/2014 1415   ALT 12 (L) 05/23/2016 0950   ALT 11 (L) 11/16/2014 1415   BILITOT 0.4 05/23/2016 0950   BILITOT 0.4 11/16/2014 1415       RADIOGRAPHIC STUDIES: I have personally reviewed the radiological images as listed and agreed with the findings in the report. Dg Chest 2 View  Result Date: 05/21/2016 CLINICAL DATA:  Cold fever shortness of breath EXAM: CHEST  2 VIEW COMPARISON:  02/21/2016, 09/05/2015, 07/04/2015 FINDINGS: There is a left-sided central venous port with the tip projecting over the SVC. Angular curve of distal catheter is similar compared to the prior study. There is bilateral hilar retraction with some increased perihilar opacity/hilar fullness compared to prior. Pleural and parenchymal changes in the apical right lung are again noted. Linear scarring in both lung bases. Small right-sided pleural effusion or thickening. Heart size is stable. No pneumothorax. IMPRESSION: 1. Pleural and parenchymal changes right thorax grossly similar compared to the prior. Slight increased hilar fullness/opacity could relate to central atelectasis or possible mild infiltrates. 2. Otherwise no significant interval change in radiographic appearance of the chest. Electronically Signed   By: Donavan Foil M.D.   On: 05/21/2016 14:07     ASSESSMENT & PLAN:  DLBCL (diffuse large B cell lymphoma) ([place]) # DLBCL-status post relapse; status post autologous stem cell transplant.  last  treatment in 2015; PET May 2016- NED. Clinically no evidence of recurrence  # Chronic respiratory failure on oxygen; multiple lung infections; ? Bronchitis vs pneumonia. Currently on amoxicllin;prednisone 40mg  x10 days- Dr.Kasa.    #  Labs/ MD/ 3 months.    Orders Placed This Encounter  Procedures  . CBC with Differential    Standing Status:   Future    Standing Expiration Date:   05/23/2017  . Comprehensive metabolic panel    Standing Status:   Future    Standing Expiration Date:   05/23/2017  . Lactate dehydrogenase    Standing Status:   Future    Standing Expiration Date:   05/23/2017   All questions were answered. The patient knows to call the clinic with any problems, questions or concerns.      Cammie Sickle, MD 05/23/2016 12:20 PM

## 2016-05-23 NOTE — Assessment & Plan Note (Signed)
#   DLBCL-status post relapse; status post autologous stem cell transplant.  last treatment in 2015; PET May 2016- NED. Clinically no evidence of recurrence  # Chronic respiratory failure on oxygen; multiple lung infections; ? Bronchitis vs pneumonia. Currently on amoxicllin;prednisone 40mg  x10 days- Dr.Kasa.    # Labs/ MD/ 3 months.

## 2016-05-27 DIAGNOSIS — R32 Unspecified urinary incontinence: Principal | ICD-10-CM

## 2016-05-27 DIAGNOSIS — M791 Myalgia, unspecified site: Principal | ICD-10-CM

## 2016-05-27 MED ORDER — TIZANIDINE HCL 2 MG PO TABS
0 refills | Status: CP
Start: 2016-05-27 — End: 2016-08-20

## 2016-05-28 MED ORDER — MYRBETRIQ 25 MG PO TB24
0 refills | Status: CP
Start: 2016-05-28 — End: 2016-07-02

## 2016-05-29 DIAGNOSIS — Z1211 Encounter for screening for malignant neoplasm of colon: Principal | ICD-10-CM

## 2016-05-30 ENCOUNTER — Encounter: Payer: Self-pay | Admitting: Internal Medicine

## 2016-05-30 ENCOUNTER — Ambulatory Visit (INDEPENDENT_AMBULATORY_CARE_PROVIDER_SITE_OTHER): Payer: Medicare Other | Admitting: Internal Medicine

## 2016-05-30 VITALS — BP 116/76 | HR 91 | Wt 125.0 lb

## 2016-05-30 DIAGNOSIS — J441 Chronic obstructive pulmonary disease with (acute) exacerbation: Secondary | ICD-10-CM

## 2016-05-30 NOTE — Progress Notes (Signed)
[facility] Consultation    Date: 05/30/2016  MRN# [id number] Anna Mcbride 12-02-1945  Referring Physician: Dr. Jeb Levering PMD - Dr. Gilford Rile [facility]) Anna Mcbride is a 70 y.o. old female seen in consultation for recurrent pulmonary infection  CC:  Chief Complaint  Patient presents with  . Follow-up    feels better: dry cough; still congested, SOB at all times; chest tightness    HPI:  LAST OV 10/31 -Patient showing signs of infection, low grade fevers, productive cough yellow sputum, chest congestion +Wheezing, increased SOB,WOB   Todays OV-patient feels much better today, coughing and chest congestion much improved Still has some wheezing, looks better than last week    Previous OV  Patient had previous recurrent respiratory tract infections requiring alternating doses of doxycycline and Levaquin    patient 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  CT chest 09/05/15 shows stable RUL infiltrate      Review of Chart ONCOLOGY History 1. 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 Status post 6 cycles of chemotherapy with CHOP9nd involved field radiation treatment 18,00 rads to the mantle field and mediasinal boost 2340. finished radiation and chemotherapy in November of 2000 residual mediastinotomy mass 2 cm 2. In May of 2001 progressive disease with mediastinal mass becoming bigger 8 cm3. The patient underwent 2 cycles of chemotherapy withRituxan, and ICE.(June of 2001) Followed by 28more cycles at [facility] with DHAP.and high-dose chemotherapy with BEAM finished treatment in October of 2001. patient had significant pancytopenia as well as fever lasting for 3 months after transplant. Recurrent sinus infection  and pulmonary infection 3. Patient underwent EUS biopsy of which was negative . (November, 2012) 4. 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 Negative for chills, fever and weight loss.  HENT: Positive for congestion.   Eyes: Negative for blurred vision and double vision.  Respiratory: Positive for cough, shortness of breath and wheezing. Negative for sputum production.   Cardiovascular: Negative for chest pain, palpitations, orthopnea and leg swelling.  Gastrointestinal: Negative for abdominal pain, heartburn, nausea and vomiting.  Neurological: Negative for dizziness.  Psychiatric/Behavioral: Negative for depression.  All other systems reviewed and are negative.    Physical Examination:   BP 116/76 (BP Location: Right Arm, Cuff Size: Normal)   Pulse 91   Wt 125 lb (56.7 kg)   SpO2 100%   BMI 22.14 kg/m   General Appearance: + distress  Neuro:without focal findings, mental status, speech normal, alert and oriented, cranial nerves 2-12 intact, reflexes normal and symmetric, sensation grossly normal  HEENT: PERRLA, EOM intact, no ptosis, no other lesions noticed, right ear canal with no sig erythema, could not visualize the TM due to cerumen impaction ; Mallampati 2 Pulmonary: normal breath sounds., diaphragmatic excursion normal.+wheezing, No rales;   +Sputum Production:   CardiovascularNormal S1,S2.  No m/r/g.  Abdominal aorta pulsation normal.    Skin:   warm, no rashes, no ecchymosis  Extremities: normal, no  cyanosis, clubbing, no edema, warm with normal capillary refill. Other findings:none   CT chest 09/05/15  IMPRESSION: 1. Perihilar consolidation which extends into the RIGHT upper lobe with chronic bronchiectasis is most consistent with radiation change. 2. Ill-defined tissue planes in the mediastinum without clear evidence of nodal metastasis. 3. Some improvement in nodular airspace disease in the RIGHT lower lobe suggests improving chronic infectious process. 4. No convincing evidence of lung cancer recurrence.     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 albuterol nebs every 4 hrs until wheezing resolves 2.robitussin with codeine   Has a history of diffuse large B-cell lymphoma, chronic infiltrates suspected to be from radiation pneumonitis, -Continue with Advair -Continue supplemental oxygen as needed -albuterol as needed   Follow up 1 month  The Patient requires high complexity decision making for assessment and support, frequent evaluation and titration of therapies.  Patient/Family are satisfied with Plan of action and management. All questions answered  [name], M.D.  [facility]  Medical Director [place] Director [facility] Cardio-Pulmonary Department

## 2016-05-30 NOTE — Patient Instructions (Addendum)
Albuterol nebs every 4 hrs   Chronic Obstructive Pulmonary Disease Chronic obstructive pulmonary disease (COPD) is a common lung condition in which airflow from the lungs is limited. COPD is a general term that can be used to describe many different lung problems that limit airflow, including both chronic bronchitis and emphysema. If you have COPD, your lung function will probably never return to normal, but there are measures you can take to improve lung function and make yourself feel better. CAUSES   Smoking (common).  Exposure to secondhand smoke.  Genetic problems.  Chronic inflammatory lung diseases or recurrent infections. SYMPTOMS  Shortness of breath, especially with physical activity.  Deep, persistent (chronic) cough with a large amount of thick mucus.  Wheezing.  Rapid breaths (tachypnea).  Gray or bluish discoloration (cyanosis) of the skin, especially in your fingers, toes, or lips.  Fatigue.  Weight loss.  Frequent infections or episodes when breathing symptoms become much worse (exacerbations).  Chest tightness. DIAGNOSIS Your health care provider will take a medical history and perform a physical examination to diagnose COPD. Additional tests for COPD may include:  Lung (pulmonary) function tests.  Chest X-ray.  CT scan.  Blood tests. TREATMENT  Treatment for COPD may include:  Inhaler and nebulizer medicines. These help manage the symptoms of COPD and make your breathing more comfortable.  Supplemental oxygen. Supplemental oxygen is only helpful if you have a low oxygen level in your blood.  Exercise and physical activity. These are beneficial for nearly all people with COPD.  Lung surgery or transplant.  Nutrition therapy to gain weight, if you are underweight.  Pulmonary rehabilitation. This may involve working with a team of health care providers and specialists, such as respiratory, occupational, and physical therapists. HOME CARE  INSTRUCTIONS  Take all medicines (inhaled or pills) as directed by your health care provider.  Avoid over-the-counter medicines or cough syrups that dry up your airway (such as antihistamines) and slow down the elimination of secretions unless instructed otherwise by your health care provider.  If you are a smoker, the most important thing that you can do is stop smoking. Continuing to smoke will cause further lung damage and breathing trouble. Ask your health care provider for help with quitting smoking. He or she can direct you to community resources or hospitals that provide support.  Avoid exposure to irritants such as smoke, chemicals, and fumes that aggravate your breathing.  Use oxygen therapy and pulmonary rehabilitation if directed by your health care provider. If you require home oxygen therapy, ask your health care provider whether you should purchase a pulse oximeter to measure your oxygen level at home.  Avoid contact with individuals who have a contagious illness.  Avoid extreme temperature and humidity changes.  Eat healthy foods. Eating smaller, more frequent meals and resting before meals may help you maintain your strength.  Stay active, but balance activity with periods of rest. Exercise and physical activity will help you maintain your ability to do things you want to do.  Preventing infection and hospitalization is very important when you have COPD. Make sure to receive all the vaccines your health care provider recommends, especially the pneumococcal and influenza vaccines. Ask your health care provider whether you need a pneumonia vaccine.  Learn and use relaxation techniques to manage stress.  Learn and use controlled breathing techniques as directed by your health care provider. Controlled breathing techniques include:  Pursed lip breathing. Start by breathing in (inhaling) through your nose for 1 second. Then,  purse your lips as if you were going to whistle and  breathe out (exhale) through the pursed lips for 2 seconds.  Diaphragmatic breathing. Start by putting one hand on your abdomen just above your waist. Inhale slowly through your nose. The hand on your abdomen should move out. Then purse your lips and exhale slowly. You should be able to feel the hand on your abdomen moving in as you exhale.  Learn and use controlled coughing to clear mucus from your lungs. Controlled coughing is a series of short, progressive coughs. The steps of controlled coughing are: 1. Lean your head slightly forward. 2. Breathe in deeply using diaphragmatic breathing. 3. Try to hold your breath for 3 seconds. 4. Keep your mouth slightly open while coughing twice. 5. Spit any mucus out into a tissue. 6. Rest and repeat the steps once or twice as needed. SEEK MEDICAL CARE IF:  You are coughing up more mucus than usual.  There is a change in the color or thickness of your mucus.  Your breathing is more labored than usual.  Your breathing is faster than usual. SEEK IMMEDIATE MEDICAL CARE IF:  You have shortness of breath while you are resting.  You have shortness of breath that prevents you from:  Being able to talk.  Performing your usual physical activities.  You have chest pain lasting longer than 5 minutes.  Your skin color is more cyanotic than usual.  You measure low oxygen saturations for longer than 5 minutes with a pulse oximeter. MAKE SURE YOU:  Understand these instructions.  Will watch your condition.  Will get help right away if you are not doing well or get worse.   This information is not intended to replace advice given to you by your health care provider. Make sure you discuss any questions you have with your health care provider.   Document Released: 04/17/2005 Document Revised: 07/29/2014 Document Reviewed: 03/04/2013 Elsevier Interactive Patient Education [employer].

## 2016-05-31 ENCOUNTER — Other Ambulatory Visit: Payer: Self-pay | Admitting: Internal Medicine

## 2016-05-31 DIAGNOSIS — Z1231 Encounter for screening mammogram for malignant neoplasm of breast: Secondary | ICD-10-CM

## 2016-06-03 ENCOUNTER — Ambulatory Visit: Attending: Internal Medicine | Primary: Internal Medicine

## 2016-06-03 DIAGNOSIS — E785 Hyperlipidemia, unspecified: Secondary | ICD-10-CM

## 2016-06-03 DIAGNOSIS — I10 Essential (primary) hypertension: Secondary | ICD-10-CM

## 2016-06-03 DIAGNOSIS — Z794 Long term (current) use of insulin: Secondary | ICD-10-CM

## 2016-06-03 DIAGNOSIS — K219 Gastro-esophageal reflux disease without esophagitis: Secondary | ICD-10-CM

## 2016-06-03 DIAGNOSIS — M1990 Unspecified osteoarthritis, unspecified site: Secondary | ICD-10-CM

## 2016-06-03 DIAGNOSIS — E1165 Type 2 diabetes mellitus with hyperglycemia: Secondary | ICD-10-CM

## 2016-06-03 DIAGNOSIS — Z6841 Body Mass Index (BMI) 40.0 and over, adult: Secondary | ICD-10-CM

## 2016-06-03 DIAGNOSIS — E119 Type 2 diabetes mellitus without complications: Principal | ICD-10-CM

## 2016-06-03 DIAGNOSIS — Z7189 Other specified counseling: Principal | ICD-10-CM

## 2016-06-03 DIAGNOSIS — E1142 Type 2 diabetes mellitus with diabetic polyneuropathy: Secondary | ICD-10-CM

## 2016-06-03 DIAGNOSIS — F419 Anxiety disorder, unspecified: Secondary | ICD-10-CM

## 2016-06-03 DIAGNOSIS — F329 Major depressive disorder, single episode, unspecified: Secondary | ICD-10-CM

## 2016-06-04 ENCOUNTER — Ambulatory Visit: Attending: Internal Medicine | Primary: Internal Medicine

## 2016-06-04 DIAGNOSIS — Z1211 Encounter for screening for malignant neoplasm of colon: Principal | ICD-10-CM

## 2016-06-04 NOTE — Progress Notes
No teaching statement needed

## 2016-06-06 ENCOUNTER — Telehealth: Payer: Self-pay | Admitting: Internal Medicine

## 2016-06-06 ENCOUNTER — Encounter: Payer: Self-pay | Admitting: Urgent Care

## 2016-06-06 ENCOUNTER — Emergency Department
Admission: EM | Admit: 2016-06-06 | Discharge: 2016-06-06 | Disposition: A | Discharge status: Home / Self Care | Payer: Medicare Other | Attending: Emergency Medicine | Admitting: Emergency Medicine

## 2016-06-06 ENCOUNTER — Emergency Department: Payer: Medicare Other

## 2016-06-06 DIAGNOSIS — J40 Bronchitis, not specified as acute or chronic: Secondary | ICD-10-CM | POA: Diagnosis not present

## 2016-06-06 DIAGNOSIS — Z79899 Other long term (current) drug therapy: Secondary | ICD-10-CM | POA: Diagnosis not present

## 2016-06-06 DIAGNOSIS — Z8572 Personal history of non-Hodgkin lymphomas: Secondary | ICD-10-CM | POA: Insufficient documentation

## 2016-06-06 DIAGNOSIS — E039 Hypothyroidism, unspecified: Secondary | ICD-10-CM | POA: Insufficient documentation

## 2016-06-06 DIAGNOSIS — Z7982 Long term (current) use of aspirin: Secondary | ICD-10-CM | POA: Diagnosis not present

## 2016-06-06 DIAGNOSIS — R05 Cough: Secondary | ICD-10-CM | POA: Diagnosis present

## 2016-06-06 LAB — CBC
HCT: 39.4 % (ref 35.0–47.0)
HEMOGLOBIN: 13.3 g/dL (ref 12.0–16.0)
MCH: 33.2 pg (ref 26.0–34.0)
MCHC: 33.9 g/dL (ref 32.0–36.0)
MCV: 98.1 fL (ref 80.0–100.0)
PLATELETS: 121 10*3/uL — AB (ref 150–440)
RBC: 4.01 MIL/uL (ref 3.80–5.20)
RDW: 14.9 % — ABNORMAL HIGH (ref 11.5–14.5)
WBC: 7.5 10*3/uL (ref 3.6–11.0)

## 2016-06-06 LAB — COMPREHENSIVE METABOLIC PANEL
ALK PHOS: 61 U/L (ref 38–126)
ALT: 15 U/L (ref 14–54)
ANION GAP: 7 (ref 5–15)
AST: 16 U/L (ref 15–41)
Albumin: 3.5 g/dL (ref 3.5–5.0)
BUN: 35 mg/dL — ABNORMAL HIGH (ref 6–20)
CALCIUM: 9 mg/dL (ref 8.9–10.3)
CO2: 27 mmol/L (ref 22–32)
CREATININE: 1.32 mg/dL — AB (ref 0.44–1.00)
Chloride: 104 mmol/L (ref 101–111)
GFR, EST AFRICAN AMERICAN: 45 mL/min — AB (ref >60)
GFR, EST NON AFRICAN AMERICAN: 39 mL/min — AB (ref >60)
Glucose, Bld: 94 mg/dL (ref 65–99)
Potassium: 3.5 mmol/L (ref 3.5–5.1)
Sodium: 138 mmol/L (ref 135–145)
Total Bilirubin: 0.7 mg/dL (ref 0.3–1.2)
Total Protein: 7.2 g/dL (ref 6.5–8.1)

## 2016-06-06 LAB — URINALYSIS COMPLETE WITH MICROSCOPIC (ARMC ONLY)
BILIRUBIN URINE: NEGATIVE
Bacteria, UA: NONE SEEN
GLUCOSE, UA: 50 mg/dL — AB
KETONES UR: NEGATIVE mg/dL
Leukocytes, UA: NEGATIVE
NITRITE: NEGATIVE
PROTEIN: 30 mg/dL — AB
Specific Gravity, Urine: 1.021 (ref 1.005–1.030)
pH: 5 (ref 5.0–8.0)

## 2016-06-06 MED ORDER — DEXTROSE 5 % IV SOLN: 1.0000 g | Freq: Once | INTRAVENOUS | Status: DC

## 2016-06-06 MED ORDER — PREDNISONE 20 MG PO TABS: ORAL_TABLET | ORAL | 0 refills | Status: DC

## 2016-06-06 MED ORDER — CEFEPIME-DEXTROSE 1 GM/50ML IV SOLR
1.0000 g | Freq: Once | INTRAVENOUS | Status: AC
  Administered 2016-06-06: 1 g via INTRAVENOUS
  Filled 2016-06-06: qty 50

## 2016-06-06 MED ORDER — CEPHALEXIN 500 MG PO CAPS: 500.0000 mg | ORAL_CAPSULE | Freq: Four times a day (QID) | ORAL | 0 refills | Status: AC

## 2016-06-06 MED ORDER — IPRATROPIUM-ALBUTEROL 0.5-2.5 (3) MG/3ML IN SOLN
3.0000 mL | Freq: Once | RESPIRATORY_TRACT | Status: AC
  Administered 2016-06-06: 3 mL via RESPIRATORY_TRACT
  Filled 2016-06-06: qty 3

## 2016-06-06 MED ORDER — METHYLPREDNISOLONE SODIUM SUCC 125 MG IJ SOLR
125.0000 mg | Freq: Once | INTRAMUSCULAR | Status: AC
Start: 2016-06-06 — End: 2016-06-06
  Administered 2016-06-06: 125 mg via INTRAVENOUS
  Filled 2016-06-06: qty 2

## 2016-06-06 MED ORDER — SODIUM CHLORIDE 0.9 % IV BOLUS (SEPSIS)
1000.0000 mL | Freq: Once | INTRAVENOUS | Status: AC
  Administered 2016-06-06: 1000 mL via INTRAVENOUS

## 2016-06-06 NOTE — Telephone Encounter (Signed)
Per DK send pt to ER due her changes. Informed husband and he states he is on the way back home and will inform pt. Nothing further needed at this time.

## 2016-06-06 NOTE — Discharge Instructions (Signed)
Take keflex four times daily for 10 days.   Take prednisone as prescribed.   Use albuterol as needed.   See your doctor in a week.   You will be called if your blood culture is positive.   Return to ER if you have severe shortness of breath, wheezing, cough, fevers.   Return

## 2016-06-06 NOTE — ED Notes (Addendum)
Pt denies SOB, CP, n/v/d, dizziness or LOC. Pt alert and oriented x 4.  Pt sts her cough is at baseline.  Pt sts she was sent by PCP, that she saw PCP today because she has had  Fever/chills x 2 days.  Pt pale, skin warm and dry.  Husband sitting w/ pt.

## 2016-06-06 NOTE — Telephone Encounter (Signed)
Pt seen 05/30/16 for COPD exacerbation. Please advise on message below.

## 2016-06-06 NOTE — ED Triage Notes (Addendum)
Patient presents to the ED with c/o fever (tmax 99.2) and chills x 2 days. Of note, patient just completed a course of Prednisone and oral Amoxicillin for a CAP diagnosis. Patient reports that she called Dr. Mortimer Fries and was advised to come in for IV ABX. NAD noted in triage; no increased WOB/SOB noted. Patient reports that she is using nebulized bronchodilators q4h scheduled.

## 2016-06-06 NOTE — ED Notes (Signed)
ED Provider at bedside. 

## 2016-06-06 NOTE — Telephone Encounter (Signed)
Pt husband called, states pt is doing no better, states she has a low grade fever, and very weak. Please call Pt requested an appt for Monday 11/20. Pt is scheduled for 11/20 at 8:30 with Dr. Mortimer Fries.

## 2016-06-06 NOTE — ED Notes (Signed)

## 2016-06-06 NOTE — ED Provider Notes (Signed)
[facility] Provider Note   CSN: [id number] Arrival date & time: 06/06/16  1949     History   Chief Complaint Chief Complaint  Patient presents with  . Pneumonia    HPI Anna Mcbride is a 70 y.o. female hx of non hodgkin's lymphoma, anemia, here with cough, congestion. Patient states that she was recently seen by her oncologist and was diagnosed with some bronchitis. She just finished a course of prednisone and amoxicillin. Over the last 2-3 days, patient has worsening congestion and cough. She's been using her albuterol with minimal relief. Has been having low-grade temperature 99 at home. Denies abdominal pain or vomiting. Last chemo and radiation about 2 years ago.    The history is provided by the patient.    Past Medical History:  Diagnosis Date  . Acute respiratory failure ([place]) 11/29/2014  . 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 Asthma   . Non Hodgkin's lymphoma [facility])     Patient Active Problem List   Diagnosis Date Noted  . 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 Recurrent upper respiratory infection (URI) 09/01/2015  . Acute respiratory failure with hypoxia ([place])   . 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 SOB (shortness of breath)   . Pneumonia   . Acute respiratory failure ([place]) 11/29/2014  . History of digestive disease 11/29/2014  . HLD (hyperlipidemia) 11/29/2014  . Adult hypothyroidism 11/29/2014  . Lymphoma ([place]) 11/29/2014  . RAD (reactive airway disease) 11/29/2014  . 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 Pain in shoulder 11/01/2014  . Fracture of humerus, proximal 11/01/2014  . Febrile neutropenia ([place]) 11/08/2013  . Decreased potassium in the blood 09/02/2013  . Acquired hypothyroidism 07/01/2013  . Lymphoma, non-Hodgkin's ([place]) 06/30/2013  . Cervical lymphadenopathy 04/20/2013  . History of lymphoma 04/20/2013  . Dyspnea 01/29/2011  . Cough 01/29/2011    Past Surgical History:  Procedure  Laterality Date  . 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 NASAL SINUS SURGERY  08/07/05 09/09/05   x 2   . VIDEO BRONCHOSCOPY Bilateral 11/30/2014   Procedure: VIDEO BRONCHOSCOPY WITHOUT FLUORO;  Surgeon: Flora Lipps, MD;  Location: [facility] ORS;  Service: Cardiopulmonary;  Laterality: Bilateral;    OB History    No data available       Home Medications    Prior to Admission medications   Medication Sig Start Date End Date Taking? Authorizing Provider  albuterol (PROVENTIL HFA;VENTOLIN HFA) 108 (90 BASE) MCG/ACT inhaler Inhale 1 puff into the lungs every 6 (six) hours as needed for wheezing or shortness of breath.    Historical Provider, MD  albuterol (PROVENTIL) (2.5 MG/3ML) 0.083% nebulizer solution Take 3 mLs (2.5 mg total) by nebulization every 4 (four) hours as needed for wheezing or shortness of breath. 05/21/16   Flora Lipps, MD  amoxicillin-clavulanate (AUGMENTIN) 875-125 MG tablet Take 1 tablet by mouth 2 (two) times daily. 05/21/16   Flora Lipps, MD  aspirin EC 81 MG tablet Take 81 mg by mouth daily.    Historical Provider, MD  citalopram (CELEXA) 20 MG tablet Take 1 tablet (20 mg total) by mouth daily. 03/05/16   Cammie Sickle, MD  fluticasone-salmeterol (ADVAIR HFA) [id number] MCG/ACT inhaler Inhale 2 puffs into the lungs 2 (two) times daily. 08/28/15   Forest Gleason, MD  guaiFENesin-codeine 100-10 MG/5ML syrup Take 5 mLs by mouth every 4 (four) hours as needed for cough. 05/21/16   Flora Lipps, MD  levothyroxine (SYNTHROID, LEVOTHROID) 25 MCG  tablet Take 1 tablet (25 mcg total) by mouth daily. 04/03/16   Cammie Sickle, MD  lidocaine-prilocaine (EMLA) cream Apply 1 application topically as needed. 05/17/15   Forest Gleason, MD  omeprazole (PRILOSEC) 20 MG capsule Take 1 capsule (20 mg total) by mouth 2 (two) times daily before a meal. 03/05/16   Cammie Sickle, MD  ondansetron (ZOFRAN) 4 MG tablet Take 1 tablet (4 mg total) by mouth every 8 (eight) hours as needed for nausea or  vomiting. 05/23/16   Cammie Sickle, MD  potassium chloride SA (K-DUR,KLOR-CON) 20 MEQ tablet TAKE 1 TABLET BY MOUTH TWICE DAILY. 03/18/16   Cammie Sickle, MD  predniSONE (DELTASONE) 20 MG tablet Take 2 tablets (40 mg total) by mouth daily with breakfast. 05/21/16   Flora Lipps, MD    Family History Family History  Problem Relation Age of Onset  . Asthma Son     Social History Social History  Substance Use Topics  . Smoking status: Never Smoker  . Smokeless tobacco: Never Used  . Alcohol use Yes     Allergies   Hydrocodone-homatropine; Tussionex pennkinetic er [employer] polst-cpm polst er]; and Ciprofloxacin   Review of Systems Review of Systems  Constitutional: Positive for chills.  Respiratory: Positive for cough.   All other systems reviewed and are negative.    Physical Exam Updated Vital Signs BP 130/86 (BP Location: Right Arm)   Pulse 94   Temp 98.9 F (37.2 C)   Resp (!) 22   Ht 5' 1.5" (1.562 m)   Wt 121 lb (54.9 kg)   SpO2 98%   BMI 22.49 kg/m   Physical Exam  Constitutional: She is oriented to person, place, and time.  Chronically ill   HENT:  Head: Normocephalic.  Mouth/Throat: Oropharynx is clear and moist.  Eyes: EOM are normal. Pupils are equal, round, and reactive to light.  Neck: Normal range of motion. Neck supple.  Cardiovascular: Normal rate, regular rhythm and normal heart sounds.   Pulmonary/Chest: Effort normal.  Wheezing R side (hx of scarring from radiation previously), no crackles   Abdominal: Soft. Bowel sounds are normal. She exhibits no distension. There is no tenderness. There is no guarding.  Musculoskeletal: Normal range of motion. She exhibits no edema.  Neurological: She is alert and oriented to person, place, and time. No cranial nerve deficit. Coordination normal.  Skin: Skin is warm.  Psychiatric: She has a normal mood and affect.  Nursing note and vitals reviewed.    ED Treatments / Results  Labs (all  labs ordered are listed, but only abnormal results are displayed) Labs Reviewed  CBC - Abnormal; Notable for the following:       Result Value   RDW 14.9 (*)    Platelets 121 (*)    All other components within normal limits  COMPREHENSIVE METABOLIC PANEL - Abnormal; Notable for the following:    BUN 35 (*)    Creatinine, Ser 1.32 (*)    GFR calc non Af Amer 39 (*)    GFR calc Af Amer 45 (*)    All other components within normal limits  URINALYSIS COMPLETEWITH MICROSCOPIC ([facility] ONLY) - Abnormal; Notable for the following:    Color, Urine YELLOW (*)    APPearance CLEAR (*)    Glucose, UA 50 (*)    Hgb urine dipstick 2+ (*)    Protein, ur 30 (*)    Squamous Epithelial / LPF 0-5 (*)    All other components within  normal limits  CULTURE, BLOOD (ROUTINE X 2)  CULTURE, BLOOD (ROUTINE X 2)    EKG  EKG Interpretation None       Radiology Dg Chest 2 View  Result Date: 06/06/2016 CLINICAL DATA:  Acute onset of shortness of breath and fever. Initial encounter. EXAM: CHEST  2 VIEW COMPARISON:  Chest radiograph performed 05/21/2016 FINDINGS: The lungs are well-aerated. Chronic scarring is again noted at the right lung apex and right lung base, and about the suprahilar regions bilaterally. Chronic right basilar pleural thickening is noted. No definite pleural effusion or pneumothorax is seen. The heart is borderline normal in size. A left-sided chest port is noted ending about the mid SVC. No acute osseous abnormalities are seen. IMPRESSION: Chronic scarring at the right lung apex and right lung base, and about the suprahilar regions bilaterally. Chronic right basilar pleural thickening. No definite superimposed focal airspace consolidation seen at this time. Electronically Signed   By: Garald Balding M.D.   On: 06/06/2016 20:45    Procedures Procedures (including critical care time)  Medications Ordered in ED Medications  sodium chloride 0.9 % bolus 1,000 mL (1,000 mLs Intravenous New  Bag/Given 06/06/16 2226)  ceFEPIme (MAXIPIME) 1 GM / 53mL IVPB premix (1 g Intravenous New Bag/Given 06/06/16 2227)  methylPREDNISolone sodium succinate (SOLU-MEDROL) 125 mg/2 mL injection 125 mg (125 mg Intravenous Given 06/06/16 2226)  ipratropium-albuterol (DUONEB) 0.5-2.5 (3) MG/3ML nebulizer solution 3 mL (3 mLs Nebulization Given 06/06/16 2226)     Initial Impression / Assessment and Plan / ED Course  I have reviewed the triage vital signs and the nursing notes.  Pertinent labs & imaging results that were available during my care of the patient were reviewed by me and considered in my medical decision making (see chart for details).  Clinical Course    Anna Mcbride is a 70 y.o. female here with fever, chills, cough. Has R sided wheezing but has previous radiation to that area so not sure if its a chronic finding. Afebrile in the ED. Vitals stable, not hypoxic. Will get labs, blood culture, CXR.   11:13 PM WBC nl. Blood cultures sent. CXR showed no infiltrate, just chronic changes. Wheezing improved with nebs, steroids. I think likely acute on chronic bronchitis. Will try another course of steroids, keflex. Loaded with cefepime in the ED. She will be called if blood culture positive.    Final Clinical Impressions(s) / ED Diagnoses   Final diagnoses:  None    New Prescriptions New Prescriptions   No medications on file     Drenda Freeze, MD 06/06/16 2314

## 2016-06-06 NOTE — ED Notes (Signed)
RN in to draw blood for MD ordered studies. Radiology also here to get patient. Will perform phlebotomy when patient returns.

## 2016-06-06 NOTE — ED Notes (Signed)
Yao,MD consulted. MD made aware of presenting complaints and triage assessment. MD with VORB for CBC, CMP, UA, and 2 view CXR. Orders to be entered and carried by this RN.

## 2016-06-07 ENCOUNTER — Other Ambulatory Visit: Payer: Self-pay | Admitting: Internal Medicine

## 2016-06-09 NOTE — Progress Notes
Subjective:   Destiny Pittman is a 70 y.o. female being seen today for Follow-up       Diabetes   She presents for her follow-up diabetic visit. She has type 2 diabetes mellitus. No MedicAlert identification noted. Her disease course has been fluctuating. There are no hypoglycemic associated symptoms. Associated symptoms include fatigue and foot paresthesias. Pertinent negatives for diabetes include no blurred vision, no chest pain, no foot ulcerations, no polydipsia, no polyphagia, no polyuria, no visual change, no weakness and no weight loss. There are no hypoglycemic complications. Symptoms are stable. Diabetic complications include autonomic neuropathy and peripheral neuropathy. Risk factors for coronary artery disease include diabetes mellitus, dyslipidemia, hypertension, obesity, post-menopausal and sedentary lifestyle. She is compliant with treatment most of the time. Her weight is fluctuating minimally. When asked about meal planning, she reported none. She has not had a previous visit with a dietitian. She never participates in exercise. Her home blood glucose trend is fluctuating dramatically. She sees a podiatrist.Eye exam is current.       Past Medical History:   Diagnosis Date   ? Anxiety    ? Arthritis    ? Depression    ? Diabetes mellitus    ? GERD (gastroesophageal reflux disease)    ? Hyperlipidemia    ? Hypertension      Past Surgical History:   Procedure Laterality Date   ? HYSTERECTOMY       Family History   Problem Relation Age of Onset   ? No Known Problems Mother    ? No Known Problems Father      Social History     Social History   ? Marital status: Single     Spouse name: N/A   ? Number of children: N/A   ? Years of education: N/A     Occupational History   ? Not on file.     Social History Main Topics   ? Smoking status: Never Smoker   ? Smokeless tobacco: Never Used   ? Alcohol use No   ? Drug use: No   ? Sexual activity: No     Other Topics Concern   ? Not on file

## 2016-06-09 NOTE — Progress Notes
She exhibits abnormal recent memory.   Nursing note and vitals reviewed.  Foot Exam: foot deformity Pes Planus bilaterally      Assessment:       ICD-10-CM ICD-9-CM    1. Advanced directives, counseling/discussion Z71.89 V65.49    2. BMI 40.0-44.9, adult Z68.41 V85.41    3. Uncontrolled type 2 diabetes mellitus with diabetic polyneuropathy, with long-term current use of insulin E11.42 250.62     Z79.4 357.2     E11.65 V58.67           Plan:   Continue current meds.  Diet and exercise as long patient can tolerate.  Salt restrictions.  FU with other MDs as scheduled.  Meds benefits and side effects discussed with patient. Patient sounds understanding.  No orders of the following type(s) were placed in this encounter: Medications.     No orders of the following type(s) were placed in this encounter: Procedures  Teaching re:  DM: untreated elevations of blood sugar can lead to problems with eyes, nerves, kidneys, heart, blood vessels, and integrity of feet.  Discussed pathophysiology of pancreas, insulin, insulin resistance, effect of obesity and smoking, concept of syndrome X  Importance of monitoring blood sugars and recording results is discussed at length.  Patient has materials for keeping records and for measuring.  Educational materials regarding diabetes is given to patient.   Patient is made aware of the importance of monitoring HgbA1c every three to six months, having a yearly dilated eye exam, checking feet regularly for damage to the skin, monitoring cholesterol, seeing an Opthalmologist yearly and maintaining a diet consistent with diabetes care.  Patient given educational material regarding diabetes.    Patient advised to avoid driving or operating machinery when she is under the influence of these controlled substances.    Health Maintenance was reviewed. The patient's HM Topic list was:                                            Health Maintenance   Topic Date Due

## 2016-06-09 NOTE — Progress Notes
?   raNITIdine (ZANTAC) 150 MG Tablet TAKE ONE TABLET TWICE A  DAY ##THANK YOU##   ? tiZANidine (ZANAFLEX) 2 MG Tablet TAKE TWO TABLETS EVERY   EIGHT HOURS (ONLY WHEN   NEEDED) ##THANK YOU##   ? torsemide (DEMADEX) 20 MG Tablet TAKE ONE TABLET EVERY    MORNING ##THANK YOU##   ? VITAMIN D-2 50000 UNITS Capsule TAKE ONE CAPSULE BY MOUTHTWO TIMES PER WEEK FOR 6 WEEKS .[name].THANK YOU.[name].     No current facility-administered medications on file prior to visit.      Allergies   Allergen Reactions   ? Asa [Aspirin] Itching   ? Pcn [Penicillins] Itching   ? Sulfa Drugs Itching         Review of Systems  Review of Systems   Constitutional: Positive for fatigue. Negative for chills, fever and weight loss.   HENT: Negative for congestion, postnasal drip, rhinorrhea and sinus pressure.    Eyes: Negative.  Negative for blurred vision.   Respiratory: Negative.  Negative for cough and wheezing.    Cardiovascular: Positive for leg swelling. Negative for chest pain and palpitations.        Right leg   Gastrointestinal: Negative.  Negative for abdominal pain, constipation, diarrhea, nausea and vomiting.   Endocrine: Negative.  Negative for polydipsia, polyphagia and polyuria.   Genitourinary: Negative for dysuria, frequency and urgency.   Musculoskeletal: Positive for arthralgias and myalgias.   Skin: Positive for rash.        Both legs   Allergic/Immunologic: Negative.    Neurological: Negative.  Negative for weakness.   Hematological: Negative.    Psychiatric/Behavioral: Positive for decreased concentration.         Objective:        VITAL SIGNS (all recorded)      Clinic Vitals       06/03/16 1408             Amb Encounter Vitals    Weight 115.6 kg (254 lb 12.8 oz)    -TD at 06/03/16 1408       Height 1.626 m (5\' 4" )    -TD at 06/03/16 1408       BMI (Calculated) 43.83    -TD at 06/03/16 1408       BSA (Calculated - sq m) 2.28    -TD at 06/03/16 1408       BP 94/60    -TD at 06/03/16 1408       BP Location Left upper arm

## 2016-06-09 NOTE — Progress Notes
Social History Narrative     Current Outpatient Prescriptions on File Prior to Visit   Medication Sig   ? ALPRAZolam (XANAX) 0.5 MG tablet Take 1 Tablet by mouth 2 times daily.   ? diclofenac (VOLTAREN GEL) 1 % Gel topical gel APPLY 4 GM TOPICALLY FOURTIMES A DAY **MEASURE    DOSE USING ENCLOSED DOSE CARD*   ? donepezil (ARICEPT) 5 MG Tablet TAKE ONE TABLET BY MOUTH AT BEDTIME .[name].THANK YOU..   ? esomeprazole (NexIUM) 40 MG Capsule Delayed Release TAKE ONE CAPSULE EVERY   DAY .[name].THANK YOU..   ? fluticasone (FLONASE) 50 MCG/ACT Suspension SPRAY ONE PUFF INTO NOSE TWICE A DAY ==SHAKE      GENTLY==   ? fluticasone-salmeterol (ADVAIR DISKUS) 250-50 MCG/DOSE Aerosol Powder Breath Activated Inhale 1 puff 2 times daily. Rinse mouth well and spit after each use.   ? gabapentin (NEURONTIN) 400 MG Capsule TAKE 2 CAPSULES BY MOUTH 3 TIMES DAILY.   ? GLOBAL INJECT EASE INSULIN SYR 31G X 5/16" 1 ML Miscellaneous USE ONE TWICE A DAY      .[name].THANK YOU..   ? hydroCHLOROthiazide (HYDRODIURIL) 25 MG Tablet TAKE ONE TABLET EVERY DAYFOR FLUIDS .[name].THANK YOU..   ? insulin lispro protamine-lispro (HumaLOG 75/25) vial 50 units qam 35 min before breakfast and 38 unit qpm 30 min before supper   ? lisinopril (PRINIVIL,ZESTRIL) 10 MG Tablet TAKE ONE TABLET EVERY DAY.[name].THANK YOU..   ? meclizine (ANTIVERT) 25 MG Tablet TAKE ONE OR TWO TABLETS  EVERY EIGHT HOURS .[name].THANKYOU..   ? mirabegron (MYRBETRIQ) 25 MG Tablet Extended Release 24 Hour TAKE ONE TABLET BY MOUTH AT BEDTIME .[name].THANK YOU..   ? MYRBETRIQ 25 MG Tablet Extended Release 24 Hour TAKE ONE TABLET BY MOUTH AT BEDTIME .[name].THANK YOU..   ? nystatin (MYCOSTATIN) 100000 UNIT/ML Suspension Take 5 mLs by mouth 4 times daily.   ? oxybutynin (DITROPAN) 5 MG Tablet TAKE TWO TABLETS BY MOUTHTWICE A DAY .[name].THANK YOU..   ? pravastatin (PRAVACHOL) 40 MG Tablet TAKE ONE TABLET AT       BEDTIME .[name].THANK YOU..   ? PROAIR HFA 108 (90 BASE) MCG/ACT Aerosol Solution Inhale 2 puffs every 6 hours as needed.

## 2016-06-09 NOTE — Progress Notes
?   Preventive Wellness Visit  11/16/1963   ? Diabetic Eye Exam  08/17/2016   ? Diabetic Foot Exam  10/23/2016   ? Hemoglobin A1C  11/01/2016   ? Lipid Profile  02/06/2017   ? Urine Protein  05/03/2017   ? Creatinine  05/03/2017   ? Basic Metabolic Panel  05/03/2017   ? Breast Cancer Screening  10/16/2017   ? Dexa  04/18/2025   ? Colon Cancer Screening  06/04/2026   ? USPSTF Hepatitis C Screening  Completed   ? Influenza Vaccine  Addressed   ? DTaP,Tdap,and Td Vaccines  Excluded   ? Pneumovax / Prevnar  Excluded   ? Zoster Vaccine  Excluded

## 2016-06-09 NOTE — Progress Notes
-  TD at 06/03/16 1408       Position Sitting    -TD at 06/03/16 1408       Pulse 94    -TD at 06/03/16 1408       Pulse Source Radial    -TD at 06/03/16 1408       Resp 18    -TD at 06/03/16 1408       Temp 37.2 ?C (98.9 ?F)    -TD at 06/03/16 1408       Temperature Source Tympanic    -TD at 06/03/16 1408       O2 Saturation 97 %    -TD at 06/03/16 1408       FiO2 Source RA    -TD at 06/03/16 1408       Pain Score Zero    -TD at 06/03/16 1408       Education/Communication Barriers?    Learning/Communication Barriers? No    -TD at 06/03/16 1408       Fall Risk Assessment    Had recent fall / Last 6 months? No recent fall    -TD at 06/03/16 1408       Does patient have a fear of falling? No    -TD at 06/03/16 1408         User Key  (r) = Recorded By, (t) = Taken By, (c) = Cosigned By    Initials Name Effective Dates    TD Duffy, Tammy, MA 10/24/15 -         Physical Exam   Constitutional: She is oriented to person, place, and time. She appears well-developed and well-nourished.   HENT:   Head: Normocephalic and atraumatic.   Right Ear: External ear normal.   Left Ear: External ear normal.   Nose: Nose normal.   Mouth/Throat: Oropharynx is clear and moist.   Eyes: Conjunctivae and EOM are normal. Pupils are equal, round, and reactive to light.   Neck: Normal range of motion. Neck supple.   Cardiovascular: Normal rate, regular rhythm, normal heart sounds and intact distal pulses.    Pulmonary/Chest: Effort normal and breath sounds normal.   Abdominal: Soft. Bowel sounds are normal. There is no tenderness.   Musculoskeletal: Normal range of motion. She exhibits edema and tenderness.   Allover joints, 1+ edema right leg   Neurological: She is alert and oriented to person, place, and time. She has normal reflexes.   Skin: Skin is warm and dry.   Psychiatric: She has a normal mood and affect. Her behavior is normal. Judgment and thought content normal. Cognition and memory are impaired.

## 2016-06-10 ENCOUNTER — Ambulatory Visit (INDEPENDENT_AMBULATORY_CARE_PROVIDER_SITE_OTHER): Payer: Medicare Other | Admitting: Internal Medicine

## 2016-06-10 ENCOUNTER — Encounter: Payer: Self-pay | Admitting: Internal Medicine

## 2016-06-10 VITALS — BP 118/72 | HR 94 | Ht 61.5 in | Wt 123.2 lb

## 2016-06-10 DIAGNOSIS — J9611 Chronic respiratory failure with hypoxia: Secondary | ICD-10-CM | POA: Diagnosis not present

## 2016-06-10 NOTE — Patient Instructions (Signed)
Continue steroids and ABX as prescribed Follow up in Dec 7

## 2016-06-10 NOTE — Progress Notes (Signed)
[facility] Consultation    Date: 06/10/2016  MRN# [id number] Anna Mcbride December 22, 1945  Referring Physician: Dr. Jeb Levering PMD - Dr. Gilford Rile [facility]) Anna Mcbride is a 70 y.o. old female seen in consultation for recurrent pulmonary infection  CC:  Chief Complaint  Patient presents with  . Acute Visit    recent hosp visit on 06/06/16 dx with bronchitis.     HPI:  LAST OV 10/31 -Patient showing signs of infection, low grade fevers, productive cough yellow sputum, chest congestion +Wheezing, increased SOB,WOB   05/30/16 OV-patient feels much better today, coughing and chest congestion much improved Still has some wheezing, looks better than last week    Previous OV in the past Patient had previous recurrent respiratory tract infections requiring alternating doses of doxycycline and Levaquin    patient 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  CT chest 09/05/15 shows stable RUL infiltrate    OV 11/20 Patient went to ER on 11/16 for weakness and SOB and wheezing Patient was treated with IV abx, IV steroids and was given IVF's CXR did NOT show acute changes  Patient feels much better since ER visit On oral steroids and oral abx      Review of Chart ONCOLOGY History 1. 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 Status post 6 cycles of chemotherapy with CHOP9nd involved field radiation treatment 18,00 rads to the mantle field and mediasinal boost 2340. finished radiation and chemotherapy in November of 2000 residual mediastinotomy mass 2 cm 2. In May of 2001 progressive disease with mediastinal mass becoming bigger 8 cm3. The patient underwent 2 cycles of chemotherapy withRituxan, and ICE.(June of  2001) Followed by 44more cycles at [facility] with DHAP.and high-dose chemotherapy with BEAM finished treatment in October of 2001. patient had significant pancytopenia as well as fever lasting for 3 months after transplant. Recurrent sinus infection and pulmonary infection 3. Patient underwent EUS biopsy of which was negative . (November, 2012) 4. 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  HENT: Negative for congestion.   Eyes: Negative for blurred vision and double vision.  Respiratory: Negative for cough, sputum production, shortness of breath and wheezing.   Cardiovascular: Negative for chest pain, palpitations, orthopnea and leg swelling.  Gastrointestinal: Negative for abdominal pain, heartburn, nausea and vomiting.  Neurological: Negative for dizziness.  Psychiatric/Behavioral: Negative for depression.  All other systems reviewed and are negative.    Physical Examination:   There were no vitals taken for this visit.  General Appearance: + distress  Neuro:without focal findings, mental status, speech normal, alert and oriented, cranial nerves 2-12 intact, reflexes normal and symmetric, sensation grossly normal  HEENT: PERRLA, EOM intact, no ptosis, no other lesions noticed, right ear canal with no sig erythema, could not visualize the TM due to cerumen impaction ;  Mallampati 2 Pulmonary: normal breath sounds., diaphragmatic excursion normal.+wheezing, No rales;   +Sputum Production:    CardiovascularNormal S1,S2.  No m/r/g.  Abdominal aorta pulsation normal.    Skin:   warm, no rashes, no ecchymosis  Extremities: normal, no cyanosis, clubbing, no edema, warm with normal capillary refill. Other findings:none   CT chest 09/05/15  IMPRESSION: 1. Perihilar consolidation which extends into the RIGHT upper lobe with chronic bronchiectasis is most consistent with radiation change. 2. Ill-defined tissue planes in the mediastinum without clear evidence of nodal metastasis. 3. Some improvement in nodular airspace disease in the RIGHT lower lobe suggests improving chronic infectious process. 4. No convincing evidence of lung cancer recurrence.     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 albuterol nebs every 4 hrs until wheezing resolves 2.robitussin with codeine 3.steroids and ABX as prescribed 4.continue oxygen as needed   Has a history of diffuse large B-cell lymphoma, chronic infiltrates suspected to be from radiation pneumonitis, -Continue with Advair -Continue supplemental oxygen as needed -albuterol as needed   Follow up 3 weeks  The Patient requires high complexity decision making for assessment and support, frequent evaluation and titration of therapies.  Patient/Family are satisfied with Plan of action and management. All questions answered  [name], M.D.  [facility]  Medical Director [place] Director [facility] Cardio-Pulmonary Department

## 2016-06-11 LAB — CULTURE, BLOOD (ROUTINE X 2)
CULTURE: NO GROWTH
Culture: NO GROWTH

## 2016-06-17 ENCOUNTER — Telehealth: Payer: Self-pay | Admitting: *Deleted

## 2016-06-17 NOTE — Telephone Encounter (Signed)
Called to report that she has been battling pneumonia for past several weeks and has been seeing Dr Mortimer Fries for it. She continues to run low grade fever and is asking to see Dr Rogue Bussing this week. Please advise

## 2016-06-18 ENCOUNTER — Emergency Department: Payer: Medicare Other

## 2016-06-18 ENCOUNTER — Telehealth: Payer: Self-pay | Admitting: Internal Medicine

## 2016-06-18 ENCOUNTER — Encounter: Payer: Self-pay | Admitting: Emergency Medicine

## 2016-06-18 ENCOUNTER — Inpatient Hospital Stay
Admission: EM | Admit: 2016-06-18 | Discharge: 2016-06-20 | DRG: 194 | Disposition: A | Discharge status: Home / Self Care | Payer: Medicare Other | Attending: Internal Medicine | Admitting: Internal Medicine

## 2016-06-18 DIAGNOSIS — E039 Hypothyroidism, unspecified: Secondary | ICD-10-CM | POA: Diagnosis present

## 2016-06-18 DIAGNOSIS — J470 Bronchiectasis with acute lower respiratory infection: Secondary | ICD-10-CM | POA: Diagnosis present

## 2016-06-18 DIAGNOSIS — Z9221 Personal history of antineoplastic chemotherapy: Secondary | ICD-10-CM | POA: Diagnosis not present

## 2016-06-18 DIAGNOSIS — J189 Pneumonia, unspecified organism: Secondary | ICD-10-CM | POA: Diagnosis not present

## 2016-06-18 DIAGNOSIS — Z66 Do not resuscitate: Secondary | ICD-10-CM | POA: Diagnosis present

## 2016-06-18 DIAGNOSIS — Z825 Family history of asthma and other chronic lower respiratory diseases: Secondary | ICD-10-CM | POA: Diagnosis not present

## 2016-06-18 DIAGNOSIS — J9611 Chronic respiratory failure with hypoxia: Secondary | ICD-10-CM | POA: Diagnosis present

## 2016-06-18 DIAGNOSIS — Z881 Allergy status to other antibiotic agents status: Secondary | ICD-10-CM | POA: Diagnosis not present

## 2016-06-18 DIAGNOSIS — F419 Anxiety disorder, unspecified: Secondary | ICD-10-CM | POA: Diagnosis present

## 2016-06-18 DIAGNOSIS — Z923 Personal history of irradiation: Secondary | ICD-10-CM | POA: Diagnosis not present

## 2016-06-18 DIAGNOSIS — J9383 Other pneumothorax: Secondary | ICD-10-CM | POA: Diagnosis not present

## 2016-06-18 DIAGNOSIS — Z8249 Family history of ischemic heart disease and other diseases of the circulatory system: Secondary | ICD-10-CM | POA: Diagnosis not present

## 2016-06-18 DIAGNOSIS — Z7951 Long term (current) use of inhaled steroids: Secondary | ICD-10-CM

## 2016-06-18 DIAGNOSIS — Z888 Allergy status to other drugs, medicaments and biological substances status: Secondary | ICD-10-CM

## 2016-06-18 DIAGNOSIS — N289 Disorder of kidney and ureter, unspecified: Secondary | ICD-10-CM | POA: Diagnosis present

## 2016-06-18 DIAGNOSIS — Z79899 Other long term (current) drug therapy: Secondary | ICD-10-CM

## 2016-06-18 DIAGNOSIS — Z833 Family history of diabetes mellitus: Secondary | ICD-10-CM

## 2016-06-18 DIAGNOSIS — Z885 Allergy status to narcotic agent status: Secondary | ICD-10-CM

## 2016-06-18 DIAGNOSIS — J939 Pneumothorax, unspecified: Secondary | ICD-10-CM | POA: Diagnosis present

## 2016-06-18 DIAGNOSIS — Z09 Encounter for follow-up examination after completed treatment for conditions other than malignant neoplasm: Secondary | ICD-10-CM

## 2016-06-18 DIAGNOSIS — Y842 Radiological procedure and radiotherapy as the cause of abnormal reaction of the patient, or of later complication, without mention of misadventure at the time of the procedure: Secondary | ICD-10-CM | POA: Diagnosis present

## 2016-06-18 DIAGNOSIS — E871 Hypo-osmolality and hyponatremia: Secondary | ICD-10-CM | POA: Diagnosis present

## 2016-06-18 DIAGNOSIS — J45909 Unspecified asthma, uncomplicated: Secondary | ICD-10-CM | POA: Diagnosis present

## 2016-06-18 DIAGNOSIS — D61818 Other pancytopenia: Secondary | ICD-10-CM | POA: Diagnosis present

## 2016-06-18 DIAGNOSIS — Z7982 Long term (current) use of aspirin: Secondary | ICD-10-CM | POA: Diagnosis not present

## 2016-06-18 DIAGNOSIS — I959 Hypotension, unspecified: Secondary | ICD-10-CM | POA: Diagnosis present

## 2016-06-18 DIAGNOSIS — J700 Acute pulmonary manifestations due to radiation: Secondary | ICD-10-CM | POA: Diagnosis present

## 2016-06-18 DIAGNOSIS — J948 Other specified pleural conditions: Secondary | ICD-10-CM | POA: Diagnosis present

## 2016-06-18 DIAGNOSIS — R0602 Shortness of breath: Secondary | ICD-10-CM

## 2016-06-18 DIAGNOSIS — Z8572 Personal history of non-Hodgkin lymphomas: Secondary | ICD-10-CM | POA: Diagnosis not present

## 2016-06-18 DIAGNOSIS — Z7952 Long term (current) use of systemic steroids: Secondary | ICD-10-CM

## 2016-06-18 DIAGNOSIS — Z8701 Personal history of pneumonia (recurrent): Secondary | ICD-10-CM

## 2016-06-18 LAB — HEPATIC FUNCTION PANEL
ALT: 14 U/L (ref 14–54)
AST: 17 U/L (ref 15–41)
Albumin: 3.3 g/dL — ABNORMAL LOW (ref 3.5–5.0)
Alkaline Phosphatase: 56 U/L (ref 38–126)
BILIRUBIN DIRECT: 0.1 mg/dL (ref 0.1–0.5)
BILIRUBIN INDIRECT: 0.7 mg/dL (ref 0.3–0.9)
Total Bilirubin: 0.8 mg/dL (ref 0.3–1.2)
Total Protein: 6.8 g/dL (ref 6.5–8.1)

## 2016-06-18 LAB — CBC WITH DIFFERENTIAL/PLATELET
BASOS PCT: 1 %
Basophils Absolute: 0 10*3/uL (ref 0–0.1)
EOS ABS: 0.1 10*3/uL (ref 0–0.7)
EOS PCT: 2 %
HCT: 39.8 % (ref 35.0–47.0)
Hemoglobin: 13.6 g/dL (ref 12.0–16.0)
LYMPHS ABS: 0.5 10*3/uL — AB (ref 1.0–3.6)
Lymphocytes Relative: 11 %
MCH: 33.7 pg (ref 26.0–34.0)
MCHC: 34.2 g/dL (ref 32.0–36.0)
MCV: 98.6 fL (ref 80.0–100.0)
MONO ABS: 0.5 10*3/uL (ref 0.2–0.9)
MONOS PCT: 10 %
Neutro Abs: 3.6 10*3/uL (ref 1.4–6.5)
Neutrophils Relative %: 76 %
PLATELETS: 148 10*3/uL — AB (ref 150–440)
RBC: 4.04 MIL/uL (ref 3.80–5.20)
RDW: 14.8 % — AB (ref 11.5–14.5)
WBC: 4.7 10*3/uL (ref 3.6–11.0)

## 2016-06-18 LAB — LIPASE, BLOOD: LIPASE: 27 U/L (ref 11–51)

## 2016-06-18 LAB — TSH: TSH: 2.95 u[IU]/mL (ref 0.350–4.500)

## 2016-06-18 LAB — BASIC METABOLIC PANEL
Anion gap: 9 (ref 5–15)
BUN: 22 mg/dL — AB (ref 6–20)
CALCIUM: 8.5 mg/dL — AB (ref 8.9–10.3)
CHLORIDE: 99 mmol/L — AB (ref 101–111)
CO2: 25 mmol/L (ref 22–32)
CREATININE: 1.13 mg/dL — AB (ref 0.44–1.00)
GFR calc Af Amer: 55 mL/min — ABNORMAL LOW (ref >60)
GFR calc non Af Amer: 47 mL/min — ABNORMAL LOW (ref >60)
Glucose, Bld: 99 mg/dL (ref 65–99)
Potassium: 3.3 mmol/L — ABNORMAL LOW (ref 3.5–5.1)
SODIUM: 133 mmol/L — AB (ref 135–145)

## 2016-06-18 LAB — LACTIC ACID, PLASMA: LACTIC ACID, VENOUS: 1.1 mmol/L (ref 0.5–1.9)

## 2016-06-18 MED ORDER — LEVOTHYROXINE SODIUM 25 MCG PO TABS
25.0000 ug | ORAL_TABLET | Freq: Every day | ORAL | Status: DC
  Administered 2016-06-19 – 2016-06-20 (×2): 25 ug via ORAL
  Filled 2016-06-18 (×2): qty 1

## 2016-06-18 MED ORDER — ASPIRIN EC 81 MG PO TBEC
81.0000 mg | DELAYED_RELEASE_TABLET | Freq: Every day | ORAL | Status: DC
  Administered 2016-06-18 – 2016-06-20 (×3): 81 mg via ORAL
  Filled 2016-06-18 (×3): qty 1

## 2016-06-18 MED ORDER — DEXTROSE 5 % IV SOLN: 1.0000 g | INTRAVENOUS | Status: DC

## 2016-06-18 MED ORDER — GUAIFENESIN-CODEINE 100-10 MG/5ML PO SOLN: 5.0000 mL | ORAL | Status: DC | PRN

## 2016-06-18 MED ORDER — ONDANSETRON HCL 4 MG PO TABS: 4.0000 mg | ORAL_TABLET | Freq: Three times a day (TID) | ORAL | Status: DC | PRN

## 2016-06-18 MED ORDER — SODIUM CHLORIDE 0.9 % IV BOLUS (SEPSIS)
1000.0000 mL | Freq: Once | INTRAVENOUS | Status: AC
  Administered 2016-06-18: 1000 mL via INTRAVENOUS

## 2016-06-18 MED ORDER — PANTOPRAZOLE SODIUM 40 MG PO TBEC
40.0000 mg | DELAYED_RELEASE_TABLET | Freq: Every day | ORAL | Status: DC
  Administered 2016-06-18 – 2016-06-20 (×3): 40 mg via ORAL
  Filled 2016-06-18 (×3): qty 1

## 2016-06-18 MED ORDER — SODIUM CHLORIDE 0.9 % IV SOLN
INTRAVENOUS | Status: DC
  Administered 2016-06-18 – 2016-06-19 (×3): via INTRAVENOUS

## 2016-06-18 MED ORDER — CEFTRIAXONE SODIUM-DEXTROSE 1-3.74 GM-% IV SOLR
1.0000 g | INTRAVENOUS | Status: DC
  Administered 2016-06-18 – 2016-06-19 (×2): 1 g via INTRAVENOUS
  Filled 2016-06-18 (×2): qty 50

## 2016-06-18 MED ORDER — POTASSIUM CHLORIDE CRYS ER 20 MEQ PO TBCR
20.0000 meq | EXTENDED_RELEASE_TABLET | Freq: Two times a day (BID) | ORAL | Status: DC
  Administered 2016-06-18 – 2016-06-20 (×4): 20 meq via ORAL
  Filled 2016-06-18 (×4): qty 1

## 2016-06-18 MED ORDER — HEPARIN SODIUM (PORCINE) 5000 UNIT/ML IJ SOLN
5000.0000 [IU] | Freq: Three times a day (TID) | INTRAMUSCULAR | Status: DC
  Administered 2016-06-18 – 2016-06-20 (×5): 5000 [IU] via SUBCUTANEOUS
  Filled 2016-06-18 (×5): qty 1

## 2016-06-18 MED ORDER — METHYLPREDNISOLONE SODIUM SUCC 125 MG IJ SOLR
60.0000 mg | Freq: Two times a day (BID) | INTRAMUSCULAR | Status: DC
  Administered 2016-06-18 – 2016-06-20 (×4): 60 mg via INTRAVENOUS
  Filled 2016-06-18 (×4): qty 2

## 2016-06-18 MED ORDER — DEXTROSE 5 % IV SOLN
500.0000 mg | Freq: Once | INTRAVENOUS | Status: DC
  Filled 2016-06-18: qty 500

## 2016-06-18 MED ORDER — DEXTROSE 5 % IV SOLN
250.0000 mg | INTRAVENOUS | Status: DC
  Filled 2016-06-18: qty 250

## 2016-06-18 MED ORDER — CEFEPIME-DEXTROSE 1 GM/50ML IV SOLR
1.0000 g | Freq: Once | INTRAVENOUS | Status: AC
  Administered 2016-06-18: 1 g via INTRAVENOUS
  Filled 2016-06-18: qty 50

## 2016-06-18 MED ORDER — CITALOPRAM HYDROBROMIDE 20 MG PO TABS
20.0000 mg | ORAL_TABLET | Freq: Every day | ORAL | Status: DC
  Administered 2016-06-18 – 2016-06-20 (×3): 20 mg via ORAL
  Filled 2016-06-18 (×3): qty 1

## 2016-06-18 MED ORDER — IPRATROPIUM-ALBUTEROL 0.5-2.5 (3) MG/3ML IN SOLN
3.0000 mL | Freq: Four times a day (QID) | RESPIRATORY_TRACT | Status: DC
  Administered 2016-06-18 – 2016-06-19 (×3): 3 mL via RESPIRATORY_TRACT
  Filled 2016-06-18 (×3): qty 3

## 2016-06-18 NOTE — ED Triage Notes (Signed)
Pt to ed with c/o fever and sob.  Pt states she called dr Zoila Shutter office today and was told to come to ed to be admitted.  Pt states she was recently diagnosed with pneumonia but has finished all abx at this time.

## 2016-06-18 NOTE — Telephone Encounter (Signed)
See phone note-triage

## 2016-06-18 NOTE — Telephone Encounter (Signed)
I spoke with her and her husband. We decided that she should come to the ED and be admitted for non-resolving pneumonia to be treated with IV abx, steroids and fluids  Merton Border, MD PCCM service Mobile [phone] Pager [phone] 06/18/2016

## 2016-06-18 NOTE — ED Provider Notes (Signed)
[facility] Emergency Department Provider Note   ____________________________________________   I have reviewed the triage vital signs and the nursing notes.   HISTORY  Chief Complaint Fever   History limited by: Not Limited   HPI Anna Mcbride is a 70 y.o. female who presents to the emergency department at the request of outside doctor for IV antibiotics and admission. The patient states that for the past few weeks she has been having intermittent fevers. She feels like they have been worse past 3 days. She states she has had some associated chills. She however denies any chest pain, shortness breath or cough. She states that she was recently treated for pneumonia however no evidence of pneumonia on chest x-ray from most recent ER visit. Patient was also put on steroids. She does state she has had a little pain under her right breast. No change with ingestion. No nausea or vomiting.   Past Medical History:  Diagnosis Date  . Acute respiratory failure ([place]) 11/29/2014  . 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 Asthma   . Non Hodgkin's lymphoma [facility])     Patient Active Problem List   Diagnosis Date Noted  . DLBCL (diffuse large B cell lymphoma) ([place]) 02/21/2016  . Recurrent upper respiratory infection (URI) 09/01/2015  . Acute respiratory failure with hypoxia ([place])   . 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 SOB (shortness of breath)   . Pneumonia   . Acute respiratory failure ([place]) 11/29/2014  . History of digestive disease 11/29/2014  . HLD (hyperlipidemia) 11/29/2014  . Adult hypothyroidism 11/29/2014  . Lymphoma ([place]) 11/29/2014  . RAD (reactive airway disease) 11/29/2014  . 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 Pain in shoulder 11/01/2014  . Fracture of humerus, proximal 11/01/2014  . Febrile neutropenia ([place]) 11/08/2013  . Decreased potassium in the blood 09/02/2013  . Acquired hypothyroidism 07/01/2013  . Lymphoma,  non-Hodgkin's ([place]) 06/30/2013  . Cervical lymphadenopathy 04/20/2013  . History of lymphoma 04/20/2013  . Dyspnea 01/29/2011  . Cough 01/29/2011    Past Surgical History:  Procedure Laterality Date  . 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 NASAL SINUS SURGERY  08/07/05 09/09/05   x 2   . VIDEO BRONCHOSCOPY Bilateral 11/30/2014   Procedure: VIDEO BRONCHOSCOPY WITHOUT FLUORO;  Surgeon: Flora Lipps, MD;  Location: [facility] ORS;  Service: Cardiopulmonary;  Laterality: Bilateral;    Prior to Admission medications   Medication Sig Start Date End Date Taking? Authorizing Provider  albuterol (PROVENTIL HFA;VENTOLIN HFA) 108 (90 BASE) MCG/ACT inhaler Inhale 1 puff into the lungs every 6 (six) hours as needed for wheezing or shortness of breath.    Historical Provider, MD  albuterol (PROVENTIL) (2.5 MG/3ML) 0.083% nebulizer solution Take 3 mLs (2.5 mg total) by nebulization every 4 (four) hours as needed for wheezing or shortness of breath. 05/21/16   Flora Lipps, MD  aspirin EC 81 MG tablet Take 81 mg by mouth daily.    Historical Provider, MD  citalopram (CELEXA) 20 MG tablet TAKE 1 TABLET BY MOUTH ONCE DAILY 06/07/16   Cammie Sickle, MD  fluticasone-salmeterol (ADVAIR HFA) 115-21 MCG/ACT inhaler Inhale 2 puffs into the lungs 2 (two) times daily. 08/28/15   Forest Gleason, MD  guaiFENesin-codeine 100-10 MG/5ML syrup Take 5 mLs by mouth every 4 (four) hours as needed for cough. 05/21/16   Flora Lipps, MD  levothyroxine (SYNTHROID, LEVOTHROID) 25 MCG tablet Take 1 tablet (25 mcg total) by mouth daily. 04/03/16   Lenetta Quaker R  Rogue Bussing, MD  lidocaine-prilocaine (EMLA) cream Apply 1 application topically as needed. 05/17/15   Forest Gleason, MD  omeprazole (PRILOSEC) 20 MG capsule Take 1 capsule (20 mg total) by mouth 2 (two) times daily before a meal. 03/05/16   Cammie Sickle, MD  ondansetron (ZOFRAN) 4 MG tablet Take 1 tablet (4 mg total) by mouth every 8 (eight) hours as needed for nausea or vomiting.  05/23/16   Cammie Sickle, MD  potassium chloride SA (K-DUR,KLOR-CON) 20 MEQ tablet TAKE 1 TABLET BY MOUTH TWICE DAILY. 03/18/16   Cammie Sickle, MD  predniSONE (DELTASONE) 20 MG tablet Take 60 mg daily x 2 days then 40 mg daily x 2 days then 20 mg daily x 2 days 06/06/16   Drenda Freeze, MD    Allergies Hydrocodone-homatropine; Tussionex pennkinetic er [employer] polst-cpm polst er]; and Ciprofloxacin  Family History  Problem Relation Age of Onset  . Asthma Son     Social History Social History  Substance Use Topics  . Smoking status: Never Smoker  . Smokeless tobacco: Never Used  . Alcohol use Yes    Review of Systems  Constitutional: Positive for fever Cardiovascular: Negative for chest pain. Respiratory: Negative for shortness of breath. Gastrointestinal: Negative for abdominal pain, vomiting and diarrhea. Genitourinary: Negative for dysuria. Musculoskeletal: Negative for back pain. Skin: Negative for rash. Neurological: Negative for headaches, focal weakness or numbness.  10-point ROS otherwise negative.  ____________________________________________   PHYSICAL EXAM:  VITAL SIGNS: ED Triage Vitals [06/18/16 1330]  Enc Vitals Group     BP 108/65     Pulse Rate 100     Resp 16     Temp 98.8 F (37.1 C)     Temp Source Oral     SpO2 97 %     Weight 118 lb (53.5 kg)     Height 5\' 1"  (1.549 m)     Head Circumference      Peak Flow      Pain Score 4   Constitutional: Alert and oriented. Well appearing and in no distress. Eyes: Conjunctivae are normal. Normal extraocular movements. ENT   Head: Normocephalic and atraumatic.   Nose: No congestion/rhinnorhea.   Mouth/Throat: Mucous membranes are moist.   Neck: No stridor. Hematological/Lymphatic/Immunilogical: No cervical lymphadenopathy. Cardiovascular: Normal rate, regular rhythm.  No murmurs, rubs, or gallops.  Respiratory: Normal respiratory effort without tachypnea nor  retractions. No wheezing or rhonchi appreciated. Gastrointestinal: Soft and nontender. No distention.  Genitourinary: Deferred Musculoskeletal: Normal range of motion in all extremities. No lower extremity edema. Neurologic:  Normal speech and language. No gross focal neurologic deficits are appreciated.  Skin:  Skin is warm, dry and intact. No rash noted. Psychiatric: Mood and affect are normal. Speech and behavior are normal. Patient exhibits appropriate insight and judgment.  ____________________________________________    LABS (pertinent positives/negatives)  Labs Reviewed  CBC WITH DIFFERENTIAL/PLATELET - Abnormal; Notable for the following:       Result Value   RDW 14.8 (*)    Platelets 148 (*)    Lymphs Abs 0.5 (*)    All other components within normal limits  BASIC METABOLIC PANEL - Abnormal; Notable for the following:    Sodium 133 (*)    Potassium 3.3 (*)    Chloride 99 (*)    BUN 22 (*)    Creatinine, Ser 1.13 (*)    Calcium 8.5 (*)    GFR calc non Af Amer 47 (*)    GFR calc Af  Amer 55 (*)    All other components within normal limits     ____________________________________________   EKG  I, Nance Pear, attending physician, personally viewed and interpreted this EKG  EKG Time: 1427 Rate: 96 Rhythm: normal sinus rhythm Axis: normal Intervals: qtc 457 QRS: narrow ST changes: no st elevation Impression: normal ekg   ____________________________________________    RADIOLOGY  CXR IMPRESSION:  Better delineated air-fluid level in the right apex consistent with  a small hydro pneumothorax. It is likely loculated given its overall  appearance and chronic changes in the right apex.    I, Toria Monte, personally viewed and evaluated these images (plain radiographs) as part of my medical decision making.  CT chest   IMPRESSION:  New small right apical hydro-pneumothorax.    New heterogeneous ground-glass opacity in left upper lobe and   lingula, suspicious for infectious or inflammatory process.    Stable paramediastinal radiation changes with traction  bronchiectasis involving right lung greater than left. No evidence  of recurrent mass or lymphadenopathy within the thorax.    ____________________________________________   PROCEDURES  Procedures  ____________________________________________   INITIAL IMPRESSION / ASSESSMENT AND PLAN / ED COURSE  Pertinent labs & imaging results that were available during my care of the patient were reviewed by me and considered in my medical decision making (see chart for details).  Patient presents to the emergency department today for admission to the hospital for pneumonia which failed outpatient antibiotics. Patient states she has had intermittent fevers however denies any chest pain shortness breath or cough to me. Afebrile here. Blood work without any leukocytosis. Chest x-ray however was concerning for possible hydropneumothorax. Will plan on getting CT scan to further evaluate.  Clinical Course    CT scan does show the hydropneumothorax. I discussed with Dr. Genevive Bi. At this point patient without any respiratory distress. Do not feel she requires any emergent intervention. However given that there was some groundglass passes will start on antibiotics. Will plan on admission to the hospital service. ____________________________________________   FINAL CLINICAL IMPRESSION(S) / ED DIAGNOSES  Final diagnoses:  Pneumothorax, unspecified type  Shortness of breath     Note: This dictation was prepared with Dragon dictation. Any transcriptional errors that result from this process are unintentional    Nance Pear, MD 06/18/16 1707

## 2016-06-18 NOTE — Telephone Encounter (Signed)
Husband says pt has had pnuemonia for 3 weeks she had a fever last night about 103  She's been on medication but that has torn her stomach up Pt has no energy, she has been doing her breathing treatments. And it is helping but he is concerned.  Would like some advise  Please call back

## 2016-06-18 NOTE — Telephone Encounter (Signed)
Husband says pt has had pnuemonia for 3 weeks and has a low grade fever she can't kick. He isn't sure if this should be handled by PCP or Dr. B. Also said today he is free but tomorrow will be busy. Please call [phone].

## 2016-06-18 NOTE — Telephone Encounter (Signed)
Dr. B reviewed chart. Patient needs to discuss her concerns with Dr. Mortimer Fries.

## 2016-06-18 NOTE — Telephone Encounter (Signed)
Anna Mcbride to contact Dr Mortimer Fries. He stated We'll do that

## 2016-06-18 NOTE — Telephone Encounter (Signed)
Please advise. DK pt.

## 2016-06-18 NOTE — H&P (Signed)
[facility] [facility] [place] NAME: Anna Mcbride    MR#:  [id number]  DATE OF BIRTH:  1946/05/26  DATE OF ADMISSION:  06/18/2016  PRIMARY CARE PHYSICIAN: Madelyn Brunner, MD   REQUESTING/REFERRING PHYSICIAN: Archie Balboa  CHIEF COMPLAINT:   Chief Complaint  Patient presents with  . Fever    HISTORY OF PRESENT ILLNESS: Anna Mcbride  is a 70 y.o. female with a known history of Non-Hodgkin's lymphoma status post radiation 10 years ago with recurrence of lymphoma 3 years ago and treated with chemotherapy. 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 She was on chronic antibiotic therapy as advised by her oncologist in the past but taken off for a few months. Last week she was in emergency room with complaint of cough and fever and found to have pneumonia, she was given Keflex and oral tapering steroids by Dr. Mortimer Fries. She took Keflex for 4 days but started having severe stomach upset and nausea with that so stopped taking it. She finished course of oral steroid for 5 days. Today she had follow-up appointment and saw Dr. Lora Havens in office, he found her still having low-grade fever short of breath and the center of her to the hospital for admission. On CT scan of chest in ER she was noted to have some infiltrate and small hydropneumothorax.  PAST MEDICAL HISTORY:   Past Medical History:  Diagnosis Date  . Acute respiratory failure ([place]) 11/29/2014  . 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 Asthma   . Non Hodgkin's lymphoma ([place])     PAST SURGICAL HISTORY: Past Surgical History:  Procedure Laterality Date  . 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 NASAL SINUS SURGERY  08/07/05 09/09/05   x 2   . VIDEO BRONCHOSCOPY Bilateral 11/30/2014   Procedure: VIDEO BRONCHOSCOPY WITHOUT FLUORO;  Surgeon: Flora Lipps, MD;  Location: [facility] ORS;  Service: Cardiopulmonary;  Laterality: Bilateral;    SOCIAL HISTORY:  Social History   Substance Use Topics  . Smoking status: Never Smoker  . Smokeless tobacco: Never Used  . Alcohol use Yes    FAMILY HISTORY:  Family History  Problem Relation Age of Onset  . Asthma Son   . Congestive Heart Failure Mother   . Diabetes Brother     DRUG ALLERGIES:  Allergies  Allergen Reactions  . Hydrocodone-Homatropine Itching  . Tussionex Pennkinetic Er [Hydrocod Polst-Cpm Polst Er] Itching  . Ciprofloxacin Rash    REVIEW OF SYSTEMS:   CONSTITUTIONAL: No fever, fatigue or weakness.  EYES: No blurred or double vision.  EARS, NOSE, AND THROAT: No tinnitus or ear pain.  RESPIRATORY: Positive for cough, shortness of breath, no wheezing or hemoptysis.  CARDIOVASCULAR: No chest pain, orthopnea, edema.  GASTROINTESTINAL: No nausea, vomiting, diarrhea or abdominal pain.  GENITOURINARY: No dysuria, hematuria.  ENDOCRINE: No polyuria, nocturia,  HEMATOLOGY: No anemia, easy bruising or bleeding SKIN: No rash or lesion. MUSCULOSKELETAL: No joint pain or arthritis.   NEUROLOGIC: No tingling, numbness, weakness.  PSYCHIATRY: No anxiety or depression.   MEDICATIONS AT HOME:  Prior to Admission medications   Medication Sig Start Date End Date Taking? Authorizing Provider  albuterol (PROVENTIL HFA;VENTOLIN HFA) 108 (90 BASE) MCG/ACT inhaler Inhale 1 puff into the lungs every 6 (six) hours as needed for wheezing or shortness of breath.    Historical Provider, MD  albuterol (PROVENTIL) (2.5 MG/3ML) 0.083% nebulizer solution Take 3 mLs (2.5 mg total) by nebulization  every 4 (four) hours as needed for wheezing or shortness of breath. 05/21/16   Flora Lipps, MD  aspirin EC 81 MG tablet Take 81 mg by mouth daily.    Historical Provider, MD  citalopram (CELEXA) 20 MG tablet TAKE 1 TABLET BY MOUTH ONCE DAILY 06/07/16   Cammie Sickle, MD  fluticasone-salmeterol (ADVAIR HFA) 115-21 MCG/ACT inhaler Inhale 2 puffs into the lungs 2 (two) times daily. 08/28/15   Forest Gleason, MD   guaiFENesin-codeine 100-10 MG/5ML syrup Take 5 mLs by mouth every 4 (four) hours as needed for cough. 05/21/16   Flora Lipps, MD  levothyroxine (SYNTHROID, LEVOTHROID) 25 MCG tablet Take 1 tablet (25 mcg total) by mouth daily. 04/03/16   Cammie Sickle, MD  lidocaine-prilocaine (EMLA) cream Apply 1 application topically as needed. 05/17/15   Forest Gleason, MD  omeprazole (PRILOSEC) 20 MG capsule Take 1 capsule (20 mg total) by mouth 2 (two) times daily before a meal. 03/05/16   Cammie Sickle, MD  ondansetron (ZOFRAN) 4 MG tablet Take 1 tablet (4 mg total) by mouth every 8 (eight) hours as needed for nausea or vomiting. 05/23/16   Cammie Sickle, MD  potassium chloride SA (K-DUR,KLOR-CON) 20 MEQ tablet TAKE 1 TABLET BY MOUTH TWICE DAILY. 03/18/16   Cammie Sickle, MD  predniSONE (DELTASONE) 20 MG tablet Take 60 mg daily x 2 days then 40 mg daily x 2 days then 20 mg daily x 2 days 06/06/16   Drenda Freeze, MD      PHYSICAL EXAMINATION:   VITAL SIGNS: Blood pressure 108/65, pulse 100, temperature 98.8 F (37.1 C), temperature source Oral, resp. rate 16, height 5\' 1"  (1.549 m), weight 53.5 kg (118 lb), SpO2 97 %.  GENERAL:  70 y.o.-year-old patient lying in the bed with no acute distress.  EYES: Pupils equal, round, reactive to light and accommodation. No scleral icterus. Extraocular muscles intact.  HEENT: Head atraumatic, normocephalic. Oropharynx and nasopharynx clear. Mucosa appears dry. NECK:  Supple, no jugular venous distention. No thyroid enlargement, no tenderness.  LUNGS: Normal breath sounds bilaterally, no wheezing, some crepitation. No use of accessory muscles of respiration.  CARDIOVASCULAR: S1, S2 normal. No murmurs, rubs, or gallops.  ABDOMEN: Soft, nontender, nondistended. Bowel sounds present. No organomegaly or mass.  EXTREMITIES: No pedal edema, cyanosis, or clubbing.  NEUROLOGIC: Cranial nerves II through XII are intact. Muscle strength 5/5 in all  extremities. Sensation intact. Gait not checked.  PSYCHIATRIC: The patient is alert and oriented x 3.  SKIN: No obvious rash, lesion, or ulcer.   LABORATORY PANEL:   CBC  Recent Labs Lab 06/18/16 1440  WBC 4.7  HGB 13.6  HCT 39.8  PLT 148*  MCV 98.6  MCH 33.7  MCHC 34.2  RDW 14.8*  LYMPHSABS 0.5*  MONOABS 0.5  EOSABS 0.1  BASOSABS 0.0   ------------------------------------------------------------------------------------------------------------------  Chemistries   Recent Labs Lab 06/18/16 1440  NA 133*  K 3.3*  CL 99*  CO2 25  GLUCOSE 99  BUN 22*  CREATININE 1.13*  CALCIUM 8.5*   ------------------------------------------------------------------------------------------------------------------ estimated creatinine clearance is 34 mL/min (by C-G formula based on SCr of 1.13 mg/dL (H)). ------------------------------------------------------------------------------------------------------------------ No results for input(s): TSH, T4TOTAL, T3FREE, THYROIDAB in the last 72 hours.  Invalid input(s): FREET3   Coagulation profile No results for input(s): INR, PROTIME in the last 168 hours. ------------------------------------------------------------------------------------------------------------------- No results for input(s): DDIMER in the last 72 hours. -------------------------------------------------------------------------------------------------------------------  Cardiac Enzymes No results for input(s): CKMB, TROPONINI, MYOGLOBIN in the last  168 hours.  Invalid input(s): CK ------------------------------------------------------------------------------------------------------------------ Invalid input(s): POCBNP  ---------------------------------------------------------------------------------------------------------------  Urinalysis    Component Value Date/Time   COLORURINE YELLOW (A) 06/06/2016 2009   APPEARANCEUR CLEAR (A) 06/06/2016 2009    APPEARANCEUR Hazy 07/03/2013 2142   LABSPEC 1.021 06/06/2016 2009   LABSPEC 1.008 07/03/2013 2142   PHURINE 5.0 06/06/2016 2009   GLUCOSEU 50 (A) 06/06/2016 2009   GLUCOSEU 150 mg/dL 07/03/2013 2142   HGBUR 2+ (A) 06/06/2016 2009   BILIRUBINUR NEGATIVE 06/06/2016 2009   BILIRUBINUR Negative 07/03/2013 2142   [place] NEGATIVE 06/06/2016 2009   PROTEINUR 30 (A) 06/06/2016 2009   NITRITE NEGATIVE 06/06/2016 2009   LEUKOCYTESUR NEGATIVE 06/06/2016 2009   LEUKOCYTESUR Negative 07/03/2013 2142     RADIOLOGY: Dg Chest 2 View  Result Date: 06/18/2016 CLINICAL DATA:  Low-grade fever for several weeks with right-sided chest pain EXAM: CHEST  2 VIEW COMPARISON:  06/06/2016 FINDINGS: Left chest wall port is again identified. Scarring in the right apex is again seen extending towards the right hilum. A small air-fluid level is better delineated on the current exam and is felt to represent a small hydro pneumothorax. Chronic changes in the right lung base are again identified similar to that seen on the prior exam. A small pleural effusion is present. No new focal infiltrate is seen. The left lung remains clear. IMPRESSION: Better delineated air-fluid level in the right apex consistent with a small hydro pneumothorax. It is likely loculated given its overall appearance and chronic changes in the right apex. Electronically Signed   By: Inez Catalina M.D.   On: 06/18/2016 14:26   Ct Chest Wo Contrast  Result Date: 06/18/2016 CLINICAL DATA:  Fever and shortness of breath. Recent pneumonia. Completed antibiotic therapy. Acute respiratory failure. Non-Hodgkin's lymphoma. Previous radiation therapy and chemotherapy. EXAM: CT CHEST WITHOUT CONTRAST TECHNIQUE: Multidetector CT imaging of the chest was performed following the standard protocol without IV contrast. COMPARISON:  09/05/2015 FINDINGS: Cardiovascular: No acute findings. Left subclavian Port-A-Cath remains in appropriate position. Aortic  atherosclerosis. Coronary artery calcification. Mediastinum/Nodes: No masses or pathologically enlarged lymph nodes identified on this unenhanced exam. Lungs/Pleura: Paramediastinal radiation changes again seen with associated traction bronchiectasis involving right lung greater than left. Pleural thickening again seen in right upper lung zone. A new small right apical hydro-pneumothorax is seen. No evidence of lung mass. Increased heterogeneous ground-glass opacity is seen in the left upper lobe and lingula since previous study, which may be due to infectious or inflammatory process. No evidence of left-sided pneumothorax or pleural effusion. Upper Abdomen:  Unremarkable. Musculoskeletal: No suspicious bone lesions or other significant abnormality. IMPRESSION: New small right apical hydro-pneumothorax. New heterogeneous ground-glass opacity in left upper lobe and lingula, suspicious for infectious or inflammatory process. Stable paramediastinal radiation changes with traction bronchiectasis involving right lung greater than left. No evidence of recurrent mass or lymphadenopathy within the thorax. Electronically Signed   By: Earle Gell M.D.   On: 06/18/2016 16:10    EKG: Orders placed or performed during the hospital encounter of 06/18/16  . ED EKG  . ED EKG  . EKG 12-Lead  . EKG 12-Lead    IMPRESSION AND PLAN:  * Community-acquired pneumonia   Baseline radiation pneumonitis   Small hydropneumothorax stable    IV ceftriaxone and azithromycin.   IV steroids.   Inhaled nebulizer therapy.   Pulmonary consult.   ER physician spoke to thorasic surgeon who suggested to monitor as patient is stable.  * Hypothyroidism   Continue Synthroid.  All the records are reviewed and case discussed with ED provider. Management plans discussed with the patient, family and they are in agreement.  CODE STATUS: DO NOT RESUSCITATE Code Status History    Date Active Date Inactive Code Status Order ID Comments  User Context   11/29/2014  2:05 PM 12/05/2014  3:50 PM DNR [id number]  Evlyn Kanner, NP Inpatient    Questions for Most Recent Historical Code Status (Order [id number])    Question Answer Comment   In the event of cardiac or respiratory ARREST Do not call a "code blue"    In the event of cardiac or respiratory ARREST Do not perform Intubation, CPR, defibrillation or ACLS    In the event of cardiac or respiratory ARREST Use medication by any route, position, wound care, and other measures to relive pain and suffering. May use oxygen, suction and manual treatment of airway obstruction as needed for comfort.      Patient's husband was present in the room during my visit.  TOTAL TIME TAKING CARE OF THIS PATIENT: 50 minutes.    Vaughan Basta M.D on 06/18/2016   Between 7am to 6pm - Pager - [phone]  After 6pm go to [email or web address] - password [email or web address] [facility]  Office  [phone]  CC: Primary care physician; Madelyn Brunner, MD   Note: This dictation was prepared with Dragon dictation along with smaller phrase technology. Any transcriptional errors that result from this process are unintentional.

## 2016-06-19 ENCOUNTER — Other Ambulatory Visit: Payer: Self-pay | Admitting: Internal Medicine

## 2016-06-19 DIAGNOSIS — J9383 Other pneumothorax: Secondary | ICD-10-CM

## 2016-06-19 DIAGNOSIS — J939 Pneumothorax, unspecified: Secondary | ICD-10-CM

## 2016-06-19 LAB — BASIC METABOLIC PANEL
ANION GAP: 6 (ref 5–15)
BUN: 22 mg/dL — AB (ref 6–20)
CHLORIDE: 109 mmol/L (ref 101–111)
CO2: 23 mmol/L (ref 22–32)
Calcium: 8.5 mg/dL — ABNORMAL LOW (ref 8.9–10.3)
Creatinine, Ser: 0.98 mg/dL (ref 0.44–1.00)
GFR calc Af Amer: >60 mL/min (ref >60)
GFR calc non Af Amer: 56 mL/min — ABNORMAL LOW (ref >60)
GLUCOSE: 146 mg/dL — AB (ref 65–99)
POTASSIUM: 4 mmol/L (ref 3.5–5.1)
Sodium: 138 mmol/L (ref 135–145)

## 2016-06-19 LAB — CBC
HEMATOCRIT: 33.4 % — AB (ref 35.0–47.0)
HEMOGLOBIN: 11.5 g/dL — AB (ref 12.0–16.0)
MCH: 34.1 pg — AB (ref 26.0–34.0)
MCHC: 34.3 g/dL (ref 32.0–36.0)
MCV: 99.4 fL (ref 80.0–100.0)
Platelets: 126 10*3/uL — ABNORMAL LOW (ref 150–440)
RBC: 3.36 MIL/uL — ABNORMAL LOW (ref 3.80–5.20)
RDW: 15.1 % — AB (ref 11.5–14.5)
WBC: 2 10*3/uL — ABNORMAL LOW (ref 3.6–11.0)

## 2016-06-19 MED ORDER — AZITHROMYCIN 500 MG PO TABS: 500.0000 mg | ORAL_TABLET | Freq: Every day | ORAL | Status: DC

## 2016-06-19 MED ORDER — IPRATROPIUM-ALBUTEROL 0.5-2.5 (3) MG/3ML IN SOLN
3.0000 mL | RESPIRATORY_TRACT | Status: DC
  Administered 2016-06-19 – 2016-06-20 (×3): 3 mL via RESPIRATORY_TRACT
  Filled 2016-06-19 (×3): qty 3

## 2016-06-19 MED ORDER — DOXYCYCLINE HYCLATE 100 MG PO TABS
100.0000 mg | ORAL_TABLET | Freq: Two times a day (BID) | ORAL | Status: DC
  Administered 2016-06-19 – 2016-06-20 (×2): 100 mg via ORAL
  Filled 2016-06-19 (×2): qty 1

## 2016-06-19 MED ORDER — AZITHROMYCIN 250 MG PO TABS: 250.0000 mg | ORAL_TABLET | Freq: Every day | ORAL | Status: DC

## 2016-06-19 MED ORDER — GUAIFENESIN ER 600 MG PO TB12
600.0000 mg | ORAL_TABLET | Freq: Two times a day (BID) | ORAL | Status: DC
  Administered 2016-06-19 – 2016-06-20 (×3): 600 mg via ORAL
  Filled 2016-06-19 (×3): qty 1

## 2016-06-19 MED ORDER — SODIUM CHLORIDE 0.9 % IV BOLUS (SEPSIS): 1000.0000 mL | INTRAVENOUS | Status: DC | PRN

## 2016-06-19 NOTE — Progress Notes (Signed)
   06/19/16 0926  Oxygen Therapy  SpO2 99 %  with ambulation

## 2016-06-19 NOTE — Care Management Important Message (Signed)
Important Message  Patient Details  Name: Anna Mcbride MRN: [id number] Date of Birth: 06-Jan-1946   Medicare Important Message Given:  Yes    Shelbie Ammons, RN 06/19/2016, 8:01 AM

## 2016-06-19 NOTE — Progress Notes (Signed)
[place] [facility] [place] NAME: Anna Mcbride    MR#:  [id number]  DATE OF BIRTH:  Jan 25, 1946  SUBJECTIVE:  CHIEF COMPLAINT:   Chief Complaint  Patient presents with  . Fever  Patient is 70 year old Caucasian female with asthma, history significant for history of non-Hodgkin's lymphoma, status post radiation 10 years ago, recurrence of lymphoma 3 years ago, status post chemotherapy, resultant scarring in the lungs, who presents to the hospital with complaints of cough, fever and was found to have pneumonia. Due to patient being short of breath. She was sent to the hospital and had CT scan of the chest, where she was found to have infiltrate and small hydropneumothorax. She was admitted to the hospital for IV Rocephin and Zithromax and slightly improved, she feels a little bit more comfortable today, admits of very little of phlegm production. She was seen by thoracic surgeon, who felt that follow-up would be recommended, but no surgical intervention for small hydropneumothorax.  Review of Systems  Constitutional: Negative for chills, fever and weight loss.  HENT: Negative for congestion.   Eyes: Negative for blurred vision and double vision.  Respiratory: Positive for cough, shortness of breath and wheezing. Negative for sputum production.   Cardiovascular: Negative for chest pain, palpitations, orthopnea, leg swelling and PND.  Gastrointestinal: Negative for abdominal pain, blood in stool, constipation, diarrhea, nausea and vomiting.  Genitourinary: Negative for dysuria, frequency, hematuria and urgency.  Musculoskeletal: Negative for falls.  Neurological: Negative for dizziness, tremors, focal weakness and headaches.  Endo/Heme/Allergies: Does not bruise/bleed easily.  Psychiatric/Behavioral: Negative for depression. The patient does not have insomnia.     VITAL SIGNS: Blood pressure (!) 99/57, pulse 90, temperature 97.8 F (36.6 C),  temperature source Oral, resp. rate 18, height 5\' 1"  (1.549 m), weight 55.7 kg (122 lb 11.2 oz), SpO2 100 %.  PHYSICAL EXAMINATION:   GENERAL:  71 y.o.-year-old patient lying in the bed in mild respiratory distress, tachypneic, intermittent pursed lip breathing.  EYES: Pupils equal, round, reactive to light and accommodation. No scleral icterus. Extraocular muscles intact.  HEENT: Head atraumatic, normocephalic. Oropharynx and nasopharynx clear.  NECK:  Supple, no jugular venous distention. No thyroid enlargement, no tenderness.  LUNGS: Some diminished breath sounds bilaterally, scattered wheezing, no rales,rhonchi , scattered crepitations on the left. Intermittent use of accessory muscles of respiration, mostly with speech and exertion.  CARDIOVASCULAR: S1, S2 normal. No murmurs, rubs, or gallops.  ABDOMEN: Soft, nontender, nondistended. Bowel sounds present. No organomegaly or mass.  EXTREMITIES: No pedal edema, cyanosis, or clubbing.  NEUROLOGIC: Cranial nerves II through XII are intact. Muscle strength 5/5 in all extremities. Sensation intact. Gait not checked.  PSYCHIATRIC: The patient is alert and oriented x 3.  SKIN: No obvious rash, lesion, or ulcer.   ORDERS/RESULTS REVIEWED:   CBC  Recent Labs Lab 06/18/16 1440 06/19/16 0558  WBC 4.7 2.0*  HGB 13.6 11.5*  HCT 39.8 33.4*  PLT 148* 126*  MCV 98.6 99.4  MCH 33.7 34.1*  MCHC 34.2 34.3  RDW 14.8* 15.1*  LYMPHSABS 0.5*  --   MONOABS 0.5  --   EOSABS 0.1  --   BASOSABS 0.0  --    ------------------------------------------------------------------------------------------------------------------  Chemistries   Recent Labs Lab 06/18/16 1440 06/19/16 0558  NA 133* 138  K 3.3* 4.0  CL 99* 109  CO2 25 23  GLUCOSE 99 146*  BUN 22* 22*  CREATININE 1.13* 0.98  CALCIUM 8.5* 8.5*  AST 17  --  ALT 14  --   ALKPHOS 56  --   BILITOT 0.8  --     ------------------------------------------------------------------------------------------------------------------ estimated creatinine clearance is 39.2 mL/min (by C-G formula based on SCr of 0.98 mg/dL). ------------------------------------------------------------------------------------------------------------------  Recent Labs  06/18/16 1440  TSH 2.950    Cardiac Enzymes No results for input(s): CKMB, TROPONINI, MYOGLOBIN in the last 168 hours.  Invalid input(s): CK ------------------------------------------------------------------------------------------------------------------ Invalid input(s): POCBNP ---------------------------------------------------------------------------------------------------------------  RADIOLOGY: Dg Chest 2 View  Result Date: 06/18/2016 CLINICAL DATA:  Low-grade fever for several weeks with right-sided chest pain EXAM: CHEST  2 VIEW COMPARISON:  06/06/2016 FINDINGS: Left chest wall port is again identified. Scarring in the right apex is again seen extending towards the right hilum. A small air-fluid level is better delineated on the current exam and is felt to represent a small hydro pneumothorax. Chronic changes in the right lung base are again identified similar to that seen on the prior exam. A small pleural effusion is present. No new focal infiltrate is seen. The left lung remains clear. IMPRESSION: Better delineated air-fluid level in the right apex consistent with a small hydro pneumothorax. It is likely loculated given its overall appearance and chronic changes in the right apex. Electronically Signed   By: Inez Catalina M.D.   On: 06/18/2016 14:26   Ct Chest Wo Contrast  Result Date: 06/18/2016 CLINICAL DATA:  Fever and shortness of breath. Recent pneumonia. Completed antibiotic therapy. Acute respiratory failure. Non-Hodgkin's lymphoma. Previous radiation therapy and chemotherapy. EXAM: CT CHEST WITHOUT CONTRAST TECHNIQUE: Multidetector CT  imaging of the chest was performed following the standard protocol without IV contrast. COMPARISON:  09/05/2015 FINDINGS: Cardiovascular: No acute findings. Left subclavian Port-A-Cath remains in appropriate position. Aortic atherosclerosis. Coronary artery calcification. Mediastinum/Nodes: No masses or pathologically enlarged lymph nodes identified on this unenhanced exam. Lungs/Pleura: Paramediastinal radiation changes again seen with associated traction bronchiectasis involving right lung greater than left. Pleural thickening again seen in right upper lung zone. A new small right apical hydro-pneumothorax is seen. No evidence of lung mass. Increased heterogeneous ground-glass opacity is seen in the left upper lobe and lingula since previous study, which may be due to infectious or inflammatory process. No evidence of left-sided pneumothorax or pleural effusion. Upper Abdomen:  Unremarkable. Musculoskeletal: No suspicious bone lesions or other significant abnormality. IMPRESSION: New small right apical hydro-pneumothorax. New heterogeneous ground-glass opacity in left upper lobe and lingula, suspicious for infectious or inflammatory process. Stable paramediastinal radiation changes with traction bronchiectasis involving right lung greater than left. No evidence of recurrent mass or lymphadenopathy within the thorax. Electronically Signed   By: Earle Gell M.D.   On: 06/18/2016 16:10    EKG:  Orders placed or performed during the hospital encounter of 06/18/16  . ED EKG  . ED EKG  . EKG 12-Lead  . EKG 12-Lead    ASSESSMENT AND PLAN:  Active Problems:   Pneumonia #1. Pneumonia, continue patient on Rocephin and Zithromax, appreciate pulmonologist and thoracic surgeon input. Get sputum cultures if possible #2 left apical hydropneumothorax, supportive therapy, close follow-up, appreciate thoracic surgery input #3. Hyponatremia, resolved on IV fluids #4 acute renal insufficiency, resolved #5.  Hypotension, improved on IV fluids #6. Pancytopenia, change Zithromax to doxycycline, get oncologist involved for recommendations   Management plans discussed with the patient, family and they are in agreement.   DRUG ALLERGIES:  Allergies  Allergen Reactions  . Hydrocodone-Homatropine Itching  . Tussionex Pennkinetic Er [Hydrocod Polst-Cpm Polst Er] Itching  . Ciprofloxacin Rash  CODE STATUS:     Code Status Orders        Start     Ordered   06/18/16 1822  Do not attempt resuscitation (DNR)  Continuous    Question Answer Comment  In the event of cardiac or respiratory ARREST Do not call a "code blue"   In the event of cardiac or respiratory ARREST Do not perform Intubation, CPR, defibrillation or ACLS   In the event of cardiac or respiratory ARREST Use medication by any route, position, wound care, and other measures to relive pain and suffering. May use oxygen, suction and manual treatment of airway obstruction as needed for comfort.      06/18/16 1821    Code Status History    Date Active Date Inactive Code Status Order ID Comments User Context   11/29/2014  2:05 PM 12/05/2014  3:50 PM DNR [id number]  Evlyn Kanner, NP Inpatient    Advance Directive Documentation   Flowsheet Row Most Recent Value  Type of Advance Directive  Living will  Pre-existing out of facility DNR order (yellow form or pink MOST form)  No data  "MOST" Form in Place?  No data      TOTAL TIME TAKING CARE OF THIS PATIENT: 40 minutes.    Theodoro Grist M.D on 06/19/2016 at 4:14 PM  Between 7am to 6pm - Pager - [phone]  After 6pm go to [email or web address] - password [email or web address] [facility]  Office  [phone]  CC: Primary care physician; Madelyn Brunner, MD

## 2016-06-19 NOTE — Care Management (Signed)
Admitted to [facility] with the diagnosis of pneumonia. Lives with husband, [name] [phone] or [phone]). Last seen Dr. Lisette Grinder October 17th. Home Health a couple of years ago. Can't remember name of agency, arranged through [facility]. No skilled Nursing. Home oxygen, night only, 2 liters per nasal cannula though [facility] for a couple of years. Takes care of all basic activities of daily living herself, drives. Slid down last October while visiting daughter in [place]. Good appetite. Prescriptions are filled at [facility] in [place]. Husband will transport. Shelbie Ammons RN MSN CCM Care Management

## 2016-06-19 NOTE — Consult Note (Signed)
[facility] Consultation      Date: 06/19/2016,   MRN# [id number] Anna Mcbride 1945/12/18 Code Status:     Code Status Orders        Start     Ordered   06/18/16 1822  Do not attempt resuscitation (DNR)  Continuous    Question Answer Comment  In the event of cardiac or respiratory ARREST Do not call a "code blue"   In the event of cardiac or respiratory ARREST Do not perform Intubation, CPR, defibrillation or ACLS   In the event of cardiac or respiratory ARREST Use medication by any route, position, wound care, and other measures to relive pain and suffering. May use oxygen, suction and manual treatment of airway obstruction as needed for comfort.      06/18/16 1821    Code Status History    Date Active Date Inactive Code Status Order ID Comments User Context   11/29/2014  2:05 PM 12/05/2014  3:50 PM DNR [id number]  Evlyn Kanner, NP Inpatient    Advance Directive Documentation   Flowsheet Row Most Recent Value  Type of Advance Directive  Living will  Pre-existing out of facility DNR order (yellow form or pink MOST form)  No data  "MOST" Form in Place?  No data     Hosp day:@LENGTHOFSTAYDAYS @ Referring MD: @ATDPROV @     PCP:      AdmissionWeight: 118 lb (53.5 kg)                 CurrentWeight: 122 lb 11.2 oz (55.7 kg) Anna Mcbride is a 70 y.o. old female seen in consultation for SOB at the request of Dr. Reva Bores.     CHIEF COMPLAINT:   SOB,weakness   HISTORY OF PRESENT ILLNESS   70 y.o. female with a known history of Non-Hodgkin's lymphoma status post radiation 10 years ago with recurrence of lymphoma 3 years ago and treated with chemotherapy.  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 She was on chronic antibiotic therapy as advised by her oncologist in the past but taken off for a few months. -Last week she was in emergency room with complaint of cough and fever and found  to have pneumonia, she was given Keflex and oral tapering steroids. - She took Keflex for 4 days but started having severe stomach upset and nausea with that so stopped taking it. - She finished course of oral steroid for 5 days.  -patient was having fevers and still with worsening SOB.    CT scan of chest in ER she was noted to have some infiltrate and small hydropneumothorax. Patient started on Iv ABX and IV steroids    PAST MEDICAL HISTORY   Past Medical History:  Diagnosis Date  . Acute respiratory failure ([place]) 11/29/2014  . 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 Asthma   . Non Hodgkin's lymphoma ([place])      SURGICAL HISTORY   Past Surgical History:  Procedure Laterality Date  . 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 NASAL SINUS SURGERY  08/07/05 09/09/05   x 2   . VIDEO BRONCHOSCOPY Bilateral 11/30/2014   Procedure: VIDEO BRONCHOSCOPY WITHOUT FLUORO;  Surgeon: Flora Lipps, MD;  Location: [facility] ORS;  Service: Cardiopulmonary;  Laterality: Bilateral;     FAMILY HISTORY   Family History  Problem Relation Age of Onset  . Asthma Son   . Congestive Heart Failure Mother   .  Diabetes Brother      SOCIAL HISTORY   Social History  Substance Use Topics  . Smoking status: Never Smoker  . Smokeless tobacco: Never Used  . Alcohol use Yes     MEDICATIONS    Home Medication:    Current Medication:  Current Facility-Administered Medications:  .  0.9 %  sodium chloride infusion, , Intravenous, Continuous, Vaughan Basta, MD, Last Rate: 75 mL/hr at 06/19/16 1115 .  aspirin EC tablet 81 mg, 81 mg, Oral, Daily, Vaughan Basta, MD, 81 mg at 06/19/16 0807 .  azithromycin (ZITHROMAX) 250 mg in dextrose 5 % 125 mL IVPB, 250 mg, Intravenous, Q24H, Vaughan Basta, MD .  azithromycin (ZITHROMAX) 500 mg in dextrose 5 % 250 mL IVPB, 500 mg, Intravenous, Once, Nance Pear, MD .  cefTRIAXone (ROCEPHIN) IVPB 1 g, 1 g, Intravenous, Q24H, Vaughan Basta, MD, 1 g at 06/18/16 2025 .  citalopram (CELEXA) tablet 20 mg, 20 mg, Oral, Daily, Vaughan Basta, MD, 20 mg at 06/19/16 0807 .  guaiFENesin (MUCINEX) 12 hr tablet 600 mg, 600 mg, Oral, BID, Theodoro Grist, MD, 600 mg at 06/19/16 0916 .  guaiFENesin-codeine 100-10 MG/5ML solution 5 mL, 5 mL, Oral, Q4H PRN, Vaughan Basta, MD .  heparin injection 5,000 Units, 5,000 Units, Subcutaneous, Q8H, Vaughan Basta, MD, 5,000 Units at 06/19/16 0526 .  ipratropium-albuterol (DUONEB) 0.5-2.5 (3) MG/3ML nebulizer solution 3 mL, 3 mL, Nebulization, Q6H, Vaughan Basta, MD, 3 mL at 06/19/16 0739 .  levothyroxine (SYNTHROID, LEVOTHROID) tablet 25 mcg, 25 mcg, Oral, QAC breakfast, Vaughan Basta, MD, 25 mcg at 06/19/16 0806 .  methylPREDNISolone sodium succinate (SOLU-MEDROL) 125 mg/2 mL injection 60 mg, 60 mg, Intravenous, Q12H, Nance Pear, MD, 60 mg at 06/19/16 0526 .  ondansetron (ZOFRAN) tablet 4 mg, 4 mg, Oral, Q8H PRN, Vaughan Basta, MD .  pantoprazole (PROTONIX) EC tablet 40 mg, 40 mg, Oral, Daily, Vaughan Basta, MD, 40 mg at 06/19/16 0806 .  potassium chloride SA (K-DUR,KLOR-CON) CR tablet 20 mEq, 20 mEq, Oral, BID, Vaughan Basta, MD, 20 mEq at 06/19/16 0807 .  sodium chloride 0.9 % bolus 1,000 mL, 1,000 mL, Intravenous, PRN, Theodoro Grist, MD    ALLERGIES   Hydrocodone-homatropine; Tussionex pennkinetic er [employer] polst-cpm polst er]; and Ciprofloxacin     REVIEW OF SYSTEMS   Review of Systems  Constitutional: Negative for chills, diaphoresis, fever, malaise/fatigue and weight loss.  HENT: Negative for congestion and hearing loss.   Eyes: Negative for blurred vision and double vision.  Respiratory: Positive for cough, shortness of breath and wheezing.   Cardiovascular: Negative for chest pain, palpitations, orthopnea and leg swelling.  Gastrointestinal: Negative for abdominal pain, heartburn, nausea and vomiting.    Genitourinary: Negative for dysuria.  Skin: Negative for rash.  Neurological: Negative for dizziness and weakness.  Endo/Heme/Allergies: Does not bruise/bleed easily.  All other systems reviewed and are negative.    VS: BP (!) 96/55 (BP Location: Right Arm)   Pulse 88   Temp 97.6 F (36.4 C) (Oral)   Resp 19   Ht 5\' 1"  (1.549 m)   Wt 122 lb 11.2 oz (55.7 kg)   SpO2 99%   BMI 23.18 kg/m      PHYSICAL EXAM  Physical Exam  Constitutional: She is oriented to person, place, and time. She appears well-developed and well-nourished. No distress.  HENT:  Head: Normocephalic and atraumatic.  Mouth/Throat: No oropharyngeal exudate.  Eyes: EOM are normal. Pupils are equal, round, and reactive to light. No scleral icterus.  Neck:  Normal range of motion. Neck supple.  Cardiovascular: Normal rate, regular rhythm and normal heart sounds.   No murmur heard. Pulmonary/Chest: No stridor. No respiratory distress. She has no wheezes. She has rales.  Abdominal: Soft. Bowel sounds are normal.  Musculoskeletal: Normal range of motion. She exhibits no edema.  Neurological: She is alert and oriented to person, place, and time. No cranial nerve deficit.  Skin: Skin is warm. She is not diaphoretic.  Psychiatric: She has a normal mood and affect.        LABS    Recent Labs     06/18/16  1440  06/19/16  0558  HGB  13.6  11.5*  HCT  39.8  33.4*  MCV  98.6  99.4  WBC  4.7  2.0*  BUN  22*  22*  CREATININE  1.13*  0.98  GLUCOSE  99  146*  CALCIUM  8.5*  8.5*  ,    No results for input(s): PH in the last 72 hours.  Invalid input(s): PCO2, PO2, BASEEXCESS, BASEDEFICITE, TFT    CULTURE RESULTS   Recent Results (from the past 240 hour(s))  Blood culture (routine x 2)     Status: None (Preliminary result)   Collection Time: 06/18/16  3:45 PM  Result Value Ref Range Status   Specimen Description BLOOD RIGHT ASSIST CONTROL  Final   Special Requests   Final    BOTTLES DRAWN AEROBIC  AND ANAEROBIC 10MLAERO,9MLANA   Culture NO GROWTH < 24 HOURS  Final   Report Status PENDING  Incomplete  Blood culture (routine x 2)     Status: None (Preliminary result)   Collection Time: 06/18/16  3:45 PM  Result Value Ref Range Status   Specimen Description BLOOD LEFT ASSIST CONTROL  Final   Special Requests   Final    BOTTLES DRAWN AEROBIC AND ANAEROBIC 11MLAERO,10MLANA   Culture NO GROWTH < 24 HOURS  Final   Report Status PENDING  Incomplete          IMAGING    Dg Chest 2 View  Result Date: 06/18/2016 CLINICAL DATA:  Low-grade fever for several weeks with right-sided chest pain EXAM: CHEST  2 VIEW COMPARISON:  06/06/2016 FINDINGS: Left chest wall port is again identified. Scarring in the right apex is again seen extending towards the right hilum. A small air-fluid level is better delineated on the current exam and is felt to represent a small hydro pneumothorax. Chronic changes in the right lung base are again identified similar to that seen on the prior exam. A small pleural effusion is present. No new focal infiltrate is seen. The left lung remains clear. IMPRESSION: Better delineated air-fluid level in the right apex consistent with a small hydro pneumothorax. It is likely loculated given its overall appearance and chronic changes in the right apex. Electronically Signed   By: Inez Catalina M.D.   On: 06/18/2016 14:26   Dg Chest 2 View  Result Date: 06/06/2016 CLINICAL DATA:  Acute onset of shortness of breath and fever. Initial encounter. EXAM: CHEST  2 VIEW COMPARISON:  Chest radiograph performed 05/21/2016 FINDINGS: The lungs are well-aerated. Chronic scarring is again noted at the right lung apex and right lung base, and about the suprahilar regions bilaterally. Chronic right basilar pleural thickening is noted. No definite pleural effusion or pneumothorax is seen. The heart is borderline normal in size. A left-sided chest port is noted ending about the mid SVC. No acute  osseous abnormalities are seen. IMPRESSION: Chronic scarring  at the right lung apex and right lung base, and about the suprahilar regions bilaterally. Chronic right basilar pleural thickening. No definite superimposed focal airspace consolidation seen at this time. Electronically Signed   By: Garald Balding M.D.   On: 06/06/2016 20:45   Dg Chest 2 View  Result Date: 05/21/2016 CLINICAL DATA:  Cold fever shortness of breath EXAM: CHEST  2 VIEW COMPARISON:  02/21/2016, 09/05/2015, 07/04/2015 FINDINGS: There is a left-sided central venous port with the tip projecting over the SVC. Angular curve of distal catheter is similar compared to the prior study. There is bilateral hilar retraction with some increased perihilar opacity/hilar fullness compared to prior. Pleural and parenchymal changes in the apical right lung are again noted. Linear scarring in both lung bases. Small right-sided pleural effusion or thickening. Heart size is stable. No pneumothorax. IMPRESSION: 1. Pleural and parenchymal changes right thorax grossly similar compared to the prior. Slight increased hilar fullness/opacity could relate to central atelectasis or possible mild infiltrates. 2. Otherwise no significant interval change in radiographic appearance of the chest. Electronically Signed   By: Donavan Foil M.D.   On: 05/21/2016 14:07   Ct Chest Wo Contrast  Result Date: 06/18/2016 CLINICAL DATA:  Fever and shortness of breath. Recent pneumonia. Completed antibiotic therapy. Acute respiratory failure. Non-Hodgkin's lymphoma. Previous radiation therapy and chemotherapy. EXAM: CT CHEST WITHOUT CONTRAST TECHNIQUE: Multidetector CT imaging of the chest was performed following the standard protocol without IV contrast. COMPARISON:  09/05/2015 FINDINGS: Cardiovascular: No acute findings. Left subclavian Port-A-Cath remains in appropriate position. Aortic atherosclerosis. Coronary artery calcification. Mediastinum/Nodes: No masses or  pathologically enlarged lymph nodes identified on this unenhanced exam. Lungs/Pleura: Paramediastinal radiation changes again seen with associated traction bronchiectasis involving right lung greater than left. Pleural thickening again seen in right upper lung zone. A new small right apical hydro-pneumothorax is seen. No evidence of lung mass. Increased heterogeneous ground-glass opacity is seen in the left upper lobe and lingula since previous study, which may be due to infectious or inflammatory process. No evidence of left-sided pneumothorax or pleural effusion. Upper Abdomen:  Unremarkable. Musculoskeletal: No suspicious bone lesions or other significant abnormality. IMPRESSION: New small right apical hydro-pneumothorax. New heterogeneous ground-glass opacity in left upper lobe and lingula, suspicious for infectious or inflammatory process. Stable paramediastinal radiation changes with traction bronchiectasis involving right lung greater than left. No evidence of recurrent mass or lymphadenopathy within the thorax. Electronically Signed   By: Earle Gell M.D.   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  The Patient requires high complexity decision making for assessment and support, frequent evaluation and titration of therapies, application of advanced monitoring technologies and extensive interpretation of multiple databases.    Patient  satisfied with Plan of action and management. All questions answered  [name], M.D.  [facility]  Medical Director [place] Director [facility] Cardio-Pulmonary Department

## 2016-06-19 NOTE — Progress Notes (Signed)
Patient ID: Anna Mcbride, female   DOB: 01-01-1946, 70 y.o.   MRN: [id number]  Chief Complaint  Patient presents with  . Fever    Referred By Dr. Ether Griffins Reason for Referral right sided hydropneumothorax  HPI Location, Quality, Duration, Severity, Timing, Context, Modifying Factors, Associated Signs and Symptoms.  Anna Mcbride is a 70 y.o. female.  She has been treated within the last several months for recurrent pneumonia by Dr. Woody Seller. She's been placed on several courses of antibiotics and steroids but has continued to have some low-grade fevers and slight shortness of breath. Her main symptom is been the fever and her shortness of breath has actually been quite minimal. She has not had any sputum production. She denied any hemoptysis. Her temperatures at home have been as high as 100.3 but nothing higher than that. She presented to the emergency room where a chest x-ray was obtained revealing a possible hydropneumothorax on the right. She then had a CT scan which I have independently reviewed and were outlined below. The patient was admitted to the hospital for intravenous antibiotics. She does state that she feels somewhat better now. She's not complaining of any shortness of breath at this time although she's not really been able to do much lying in bed. She notices that over the last several months she has been getting slightly more short of breath with activities. She does use oxygen at night and whenever she walks. She denied any history of fevers separate for the last several weeks.   Past Medical History:  Diagnosis Date  . Acute respiratory failure ([place]) 11/29/2014  . 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 Asthma   . Non Hodgkin's lymphoma [facility])     Past Surgical History:  Procedure Laterality Date  . 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 NASAL SINUS SURGERY  08/07/05 09/09/05   x 2   . VIDEO BRONCHOSCOPY Bilateral 11/30/2014   Procedure: VIDEO BRONCHOSCOPY WITHOUT FLUORO;   Surgeon: Flora Lipps, MD;  Location: [facility] ORS;  Service: Cardiopulmonary;  Laterality: Bilateral;    Family History  Problem Relation Age of Onset  . Asthma Son   . Congestive Heart Failure Mother   . Diabetes Brother     Social History Social History  Substance Use Topics  . Smoking status: Never Smoker  . Smokeless tobacco: Never Used  . Alcohol use Yes    Allergies  Allergen Reactions  . Hydrocodone-Homatropine Itching  . Tussionex Pennkinetic Er [Hydrocod Polst-Cpm Polst Er] Itching  . Ciprofloxacin Rash    Current Facility-Administered Medications  Medication Dose Route Frequency Provider Last Rate Last Dose  . 0.9 %  sodium chloride infusion   Intravenous Continuous Vaughan Basta, MD 75 mL/hr at 06/19/16 1115    . aspirin EC tablet 81 mg  81 mg Oral Daily Vaughan Basta, MD   81 mg at 06/19/16 0807  . azithromycin (ZITHROMAX) 250 mg in dextrose 5 % 125 mL IVPB  250 mg Intravenous Q24H Vaughan Basta, MD      . azithromycin (ZITHROMAX) 500 mg in dextrose 5 % 250 mL IVPB  500 mg Intravenous Once Nance Pear, MD      . cefTRIAXone (ROCEPHIN) IVPB 1 g  1 g Intravenous Q24H Vaughan Basta, MD   1 g at 06/18/16 2025  . citalopram (CELEXA) tablet 20 mg  20 mg Oral Daily Vaughan Basta, MD   20 mg at 06/19/16 0807  . guaiFENesin (MUCINEX) 12 hr tablet 600 mg  600  mg Oral BID Theodoro Grist, MD   600 mg at 06/19/16 0916  . guaiFENesin-codeine 100-10 MG/5ML solution 5 mL  5 mL Oral Q4H PRN Vaughan Basta, MD      . heparin injection 5,000 Units  5,000 Units Subcutaneous Q8H Vaughan Basta, MD   5,000 Units at 06/19/16 0526  . ipratropium-albuterol (DUONEB) 0.5-2.5 (3) MG/3ML nebulizer solution 3 mL  3 mL Nebulization Q4H Flora Lipps, MD      . levothyroxine (SYNTHROID, LEVOTHROID) tablet 25 mcg  25 mcg Oral QAC breakfast Vaughan Basta, MD   25 mcg at 06/19/16 0806  . methylPREDNISolone sodium succinate (SOLU-MEDROL) 125  mg/2 mL injection 60 mg  60 mg Intravenous Q12H Nance Pear, MD   60 mg at 06/19/16 0526  . ondansetron (ZOFRAN) tablet 4 mg  4 mg Oral Q8H PRN Vaughan Basta, MD      . pantoprazole (PROTONIX) EC tablet 40 mg  40 mg Oral Daily Vaughan Basta, MD   40 mg at 06/19/16 0806  . potassium chloride SA (K-DUR,KLOR-CON) CR tablet 20 mEq  20 mEq Oral BID Vaughan Basta, MD   20 mEq at 06/19/16 0807  . sodium chloride 0.9 % bolus 1,000 mL  1,000 mL Intravenous PRN Theodoro Grist, MD          Review of Systems A complete review of systems was asked and was negative except for the following positive findingsShortness of breath, low-grade fever.  Blood pressure (!) 96/55, pulse 88, temperature 97.6 F (36.4 C), temperature source Oral, resp. rate 19, height 5\' 1"  (1.549 m), weight 122 lb 11.2 oz (55.7 kg), SpO2 99 %.  Physical Exam CONSTITUTIONAL:  Pleasant, well-developed, well-nourished, and in no acute distress. EYES: Pupils equal and reactive to light, Sclera non-icteric EARS, NOSE, MOUTH AND THROAT:  The oropharynx was clear.  Dentition is good repair.  Oral mucosa pink and moist. LYMPH NODES:  Lymph nodes in the neck and axillae were normal RESPIRATORY:  Lungs were clear.  Normal respiratory effort without pathologic use of accessory muscles of respiration CARDIOVASCULAR: Heart was regular without murmurs.  There were no carotid bruits. GI: The abdomen was soft, nontender, and nondistended. There were no palpable masses. There was no hepatosplenomegaly. There were normal bowel sounds in all quadrants. GU:  Rectal deferred.   MUSCULOSKELETAL:  Normal muscle strength and tone.  No clubbing or cyanosis.   SKIN:  There were no pathologic skin lesions.  There were no nodules on palpation. NEUROLOGIC:  Sensation is normal.  Cranial nerves are grossly intact. PSYCH:  Oriented to person, place and time.  Mood and affect are normal.  Data Reviewed Multiple CT scans and chest  x-rays  I have personally reviewed the patient's imaging, laboratory findings and medical records.    Assessment    I have independently reviewed the patient's chest x-ray and CT scan. There is architectural distortion of the right hilum and in particular the right upper lobe. This has been present for several years and is related to her radiation therapy and chemotherapy for her non-Hodgkin's lymphoma. The hydropneumothorax at the right apex is relatively small and at this time is not contributing to her symptoms. I do not feel that this warrants any aggressive surgical intervention at this time.    Plan    The patient is followed by oncology service and I would ask him to come see her again. The low-grade fever certainly are concerning for potential lymphoma recurrence. Her white blood cell count is 2.0 and  she does not appear to be toxic. I would recommend repeating her chest x-ray and following her clinical status. If she continues to improve I would not recommend draining her hydropneumothorax as it is quite small. I will continue to follow with you.      Nestor Lewandowsky, MD 06/19/2016, 2:03 PM

## 2016-06-20 ENCOUNTER — Inpatient Hospital Stay: Payer: Medicare Other

## 2016-06-20 ENCOUNTER — Other Ambulatory Visit: Payer: Self-pay | Admitting: Internal Medicine

## 2016-06-20 DIAGNOSIS — J939 Pneumothorax, unspecified: Secondary | ICD-10-CM

## 2016-06-20 LAB — CBC
HEMATOCRIT: 32.3 % — AB (ref 35.0–47.0)
HEMOGLOBIN: 11 g/dL — AB (ref 12.0–16.0)
MCH: 33.2 pg (ref 26.0–34.0)
MCHC: 34 g/dL (ref 32.0–36.0)
MCV: 97.8 fL (ref 80.0–100.0)
Platelets: 144 10*3/uL — ABNORMAL LOW (ref 150–440)
RBC: 3.3 MIL/uL — AB (ref 3.80–5.20)
RDW: 15.8 % — ABNORMAL HIGH (ref 11.5–14.5)
WBC: 11.9 10*3/uL — ABNORMAL HIGH (ref 3.6–11.0)

## 2016-06-20 MED ORDER — DOXYCYCLINE HYCLATE 100 MG PO TABS: 100.0000 mg | ORAL_TABLET | Freq: Two times a day (BID) | ORAL | 0 refills | Status: DC

## 2016-06-20 MED ORDER — PREDNISONE 10 MG (21) PO TBPK: ORAL_TABLET | ORAL | 0 refills | Status: DC

## 2016-06-20 MED ORDER — CEFUROXIME AXETIL 250 MG PO TABS: 250.0000 mg | ORAL_TABLET | Freq: Two times a day (BID) | ORAL | 0 refills | Status: DC

## 2016-06-20 MED ORDER — GUAIFENESIN ER 600 MG PO TB12: 600.0000 mg | ORAL_TABLET | Freq: Two times a day (BID) | ORAL | 0 refills | Status: DC

## 2016-06-20 MED ORDER — IPRATROPIUM-ALBUTEROL 0.5-2.5 (3) MG/3ML IN SOLN: 3.0000 mL | RESPIRATORY_TRACT | Status: DC | PRN

## 2016-06-20 NOTE — Progress Notes (Signed)
Discharge paperwork reviewed with patient including new medications. Prescriptions attached. Followup appointments scheduled for Dr. Rogue Bussing and Dr. Mortimer Fries. Patient verbalizes understanding of discharge instructions. Patient is stable and ready for discharge. Patient's husband to transport home this afternoon.

## 2016-06-20 NOTE — Progress Notes (Signed)
  Patient ID: Anna Mcbride, female   DOB: [birth year], 70 y.o.   MRN: [id number]  HISTORY: She states she didn't sleep very well secondary to anxiety but does not complain of any shortness of breath or cough. She did not have any fevers and feels well overall.   Vitals:   06/19/16 1949 06/20/16 0349  BP: 107/64 (!) 141/60  Pulse: (!) 101 94  Resp: 16 18  Temp: 98 F (36.7 C) 97.9 F (36.6 C)     EXAM:    Resp: Lungs are clear bilaterally.  No respiratory distress, normal effort. Heart:  Regular without murmurs Abd:  Abdomen is soft, non distended and non tender. No masses are palpable.  There is no rebound and no guarding.  Neurological: Alert and oriented to person, place, and time. Coordination normal.  Skin: Skin is warm and dry. No rash noted. No diaphoretic. No erythema. No pallor.  Psychiatric: Normal mood and affect. Normal behavior. Judgment and thought content normal.    ASSESSMENT: Low-grade fevers with a small right apical hydropneumothorax. I had a long discussion yesterday with Dr.Bruhmandy regarding her lymphoma. He does not feel that there is any evidence of lymphoma recurrence. In addition he did not feel that the Port-A-Cath required removal.   PLAN:   I would like to repeat her chest x-ray today. She seems be progressing quite nicely clinically and I do not recommend any surgical intervention at this time. I would continue her antibiotic coverage.    Nestor Lewandowsky, MD

## 2016-06-20 NOTE — Discharge Summary (Signed)
[facility] NAME: Anna Mcbride    MR#:  [id number]  DATE OF BIRTH:  07-15-1946  DATE OF ADMISSION:  06/18/2016 ADMITTING PHYSICIAN: Vaughan Basta, MD  DATE OF DISCHARGE: 06/20/2016  PRIMARY CARE PHYSICIAN: Madelyn Brunner, MD    ADMISSION DIAGNOSIS:  Shortness of breath [R06.02] Pneumothorax, unspecified type [J93.9]  DISCHARGE DIAGNOSIS:  Active Problems:   Pneumonia   pancytopenia  SECONDARY DIAGNOSIS:   Past Medical History:  Diagnosis Date  . Acute respiratory failure ([place]) 11/29/2014  . 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 Asthma   . Non Hodgkin's lymphoma ([place])     HOSPITAL COURSE:   #1. Pneumonia, continue patient on Rocephin and Zithromax, appreciate pulmonologist and thoracic surgeon input.  sputum cultures if possible- pt could not give the sample.   Also have chronic lung changes secondary to radiation.   Given IV steroids, give oral tapering steroids on discharge. #2 left apical hydropneumothorax, supportive therapy, close follow-up, appreciate thoracic surgery input, no interventions. #3. Hyponatremia, resolved on IV fluids #4 acute renal insufficiency, resolved #5. Hypotension, improved on IV fluids #6. Pancytopenia, change Zithromax to doxycycline, improved next day with treatment of Pneumonia.    I spoke to Oncologist on phone- he suggested to discharge pt and follow with him in office in 1 week.  DISCHARGE CONDITIONS:   Stable.  CONSULTS OBTAINED:  Treatment Team:  Nestor Lewandowsky, MD Flora Lipps, MD Cammie Sickle, MD  DRUG ALLERGIES:   Allergies  Allergen Reactions  . Hydrocodone-Homatropine Itching  . Tussionex Pennkinetic Er [Hydrocod Polst-Cpm Polst Er] Itching  . Ciprofloxacin Rash    DISCHARGE MEDICATIONS:   Current Discharge Medication List    START taking these medications   Details  cefUROXime (CEFTIN) 250 MG tablet Take 1 tablet (250 mg total) by  mouth 2 (two) times daily with a meal. Qty: 8 tablet, Refills: 0    doxycycline (VIBRA-TABS) 100 MG tablet Take 1 tablet (100 mg total) by mouth 2 (two) times daily. Qty: 10 tablet, Refills: 0    guaiFENesin (MUCINEX) 600 MG 12 hr tablet Take 1 tablet (600 mg total) by mouth 2 (two) times daily. Qty: 10 tablet, Refills: 0    predniSONE (STERAPRED UNI-PAK 21 TAB) 10 MG (21) TBPK tablet Take 6 tabs first day, 5 tab on day 2, then 4 on day 3rd, 3 tabs on day 4th , 2 tab on day 5th, and 1 tab on 6th day. Qty: 21 tablet, Refills: 0      CONTINUE these medications which have NOT CHANGED   Details  albuterol (PROVENTIL HFA;VENTOLIN HFA) 108 (90 BASE) MCG/ACT inhaler Inhale 1 puff into the lungs every 6 (six) hours as needed for wheezing or shortness of breath.    albuterol (PROVENTIL) (2.5 MG/3ML) 0.083% nebulizer solution Take 3 mLs (2.5 mg total) by nebulization every 4 (four) hours as needed for wheezing or shortness of breath. Qty: 75 mL, Refills: 12    aspirin EC 81 MG tablet Take 81 mg by mouth daily.    citalopram (CELEXA) 20 MG tablet TAKE 1 TABLET BY MOUTH ONCE DAILY Qty: 90 tablet, Refills: 3    fluticasone-salmeterol (ADVAIR HFA) 115-21 MCG/ACT inhaler Inhale 2 puffs into the lungs 2 (two) times daily. Qty: 1 Inhaler, Refills: 12    levothyroxine (SYNTHROID, LEVOTHROID) 25 MCG tablet Take 1 tablet (25 mcg total) by mouth daily. Qty: 30 tablet, Refills: 3    lidocaine-prilocaine (EMLA) cream Apply  1 application topically as needed. Qty: 30 g, Refills: 3   Associated Diagnoses: Non-Hodgkin's lymphoma, unspecified body region, unspecified non-Hodgkin lymphoma type (HCC)    omeprazole (PRILOSEC) 20 MG capsule Take 1 capsule (20 mg total) by mouth 2 (two) times daily before a meal. Qty: 60 capsule, Refills: 3   Associated Diagnoses: Non-Hodgkin's lymphoma, unspecified body region, unspecified non-Hodgkin lymphoma type (HCC)    ondansetron (ZOFRAN) 4 MG tablet Take 1 tablet (4 mg  total) by mouth every 8 (eight) hours as needed for nausea or vomiting. Qty: 30 tablet, Refills: 1   Associated Diagnoses: Non-Hodgkin's lymphoma, unspecified body region, unspecified non-Hodgkin lymphoma type (HCC)    potassium chloride SA (K-DUR,KLOR-CON) 20 MEQ tablet TAKE 1 TABLET BY MOUTH TWICE DAILY. Qty: 30 tablet, Refills: 2      STOP taking these medications     guaiFENesin-codeine 100-10 MG/5ML syrup          DISCHARGE INSTRUCTIONS:    Follow with Oncology and pulmonary clinic in 1 week.  If you experience worsening of your admission symptoms, develop shortness of breath, life threatening emergency, suicidal or homicidal thoughts you must seek medical attention immediately by calling 911 or calling your MD immediately  if symptoms less severe.  You Must read complete instructions/literature along with all the possible adverse reactions/side effects for all the Medicines you take and that have been prescribed to you. Take any new Medicines after you have completely understood and accept all the possible adverse reactions/side effects.   Please note  You were cared for by a hospitalist during your hospital stay. If you have any questions about your discharge medications or the care you received while you were in the hospital after you are discharged, you can call the unit and asked to speak with the hospitalist on call if the hospitalist that took care of you is not available. Once you are discharged, your primary care physician will handle any further medical issues. Please note that NO REFILLS for any discharge medications will be authorized once you are discharged, as it is imperative that you return to your primary care physician (or establish a relationship with a primary care physician if you do not have one) for your aftercare needs so that they can reassess your need for medications and monitor your lab values.    Today   CHIEF COMPLAINT:   Chief Complaint  Patient  presents with  . Fever    HISTORY OF PRESENT ILLNESS:  Anna Mcbride  is a 70 y.o. female with a known history of Non-Hodgkin's lymphoma status post radiation 10 years ago with recurrence of lymphoma 3 years ago and treated with chemotherapy. 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 She was on chronic antibiotic therapy as advised by her oncologist in the past but taken off for a few months. Last week she was in emergency room with complaint of cough and fever and found to have pneumonia, she was given Keflex and oral tapering steroids by Dr. Mortimer Fries. She took Keflex for 4 days but started having severe stomach upset and nausea with that so stopped taking it. She finished course of oral steroid for 5 days. Today she had follow-up appointment and saw Dr. Lora Havens in office, he found her still having low-grade fever short of breath and the center of her to the hospital for admission. On CT scan of chest in ER she was noted to have some infiltrate and small hydropneumothorax.  VITAL SIGNS:  Blood pressure (!) 141/60, pulse 94, temperature 97.9 F (36.6 C), temperature source Oral, resp. rate 18, height 5\' 1"  (1.549 m), weight 55.7 kg (122 lb 11.2 oz), SpO2 98 %.  I/O:   Intake/Output Summary (Last 24 hours) at 06/20/16 0941 Last data filed at 06/20/16 0500  Gross per 24 hour  Intake           2412.5 ml  Output                0 ml  Net           2412.5 ml    PHYSICAL EXAMINATION:   GENERAL:  70 y.o.-year-old patient lying in the bed in mild respiratory distress, tachypneic, intermittent pursed lip breathing.  EYES: Pupils equal, round, reactive to light and accommodation. No scleral icterus. Extraocular muscles intact.  HEENT: Head atraumatic, normocephalic. Oropharynx and nasopharynx clear.  NECK:  Supple, no jugular venous distention. No thyroid enlargement, no tenderness.  LUNGS: Some diminished breath sounds bilaterally, scattered wheezing,  no rales,rhonchi , scattered crepitations on the left. Intermittent use of accessory muscles of respiration, mostly with speech and exertion.  CARDIOVASCULAR: S1, S2 normal. No murmurs, rubs, or gallops.  ABDOMEN: Soft, nontender, nondistended. Bowel sounds present. No organomegaly or mass.  EXTREMITIES: No pedal edema, cyanosis, or clubbing.  NEUROLOGIC: Cranial nerves II through XII are intact. Muscle strength 5/5 in all extremities. Sensation intact. Gait not checked.  PSYCHIATRIC: The patient is alert and oriented x 3.  SKIN: No obvious rash, lesion, or ulcer.   DATA REVIEW:   CBC  Recent Labs Lab 06/20/16 0518  WBC 11.9*  HGB 11.0*  HCT 32.3*  PLT 144*    Chemistries   Recent Labs Lab 06/18/16 1440 06/19/16 0558  NA 133* 138  K 3.3* 4.0  CL 99* 109  CO2 25 23  GLUCOSE 99 146*  BUN 22* 22*  CREATININE 1.13* 0.98  CALCIUM 8.5* 8.5*  AST 17  --   ALT 14  --   ALKPHOS 56  --   BILITOT 0.8  --     Cardiac Enzymes No results for input(s): TROPONINI in the last 168 hours.  Microbiology Results  Results for orders placed or performed during the hospital encounter of 06/18/16  Blood culture (routine x 2)     Status: None (Preliminary result)   Collection Time: 06/18/16  3:45 PM  Result Value Ref Range Status   Specimen Description BLOOD RIGHT ASSIST CONTROL  Final   Special Requests   Final    BOTTLES DRAWN AEROBIC AND ANAEROBIC 10MLAERO,9MLANA   Culture NO GROWTH 2 DAYS  Final   Report Status PENDING  Incomplete  Blood culture (routine x 2)     Status: None (Preliminary result)   Collection Time: 06/18/16  3:45 PM  Result Value Ref Range Status   Specimen Description BLOOD LEFT ASSIST CONTROL  Final   Special Requests   Final    BOTTLES DRAWN AEROBIC AND ANAEROBIC 11MLAERO,10MLANA   Culture NO GROWTH 2 DAYS  Final   Report Status PENDING  Incomplete    RADIOLOGY:  Dg Chest 2 View  Result Date: 06/18/2016 CLINICAL DATA:  Low-grade fever for several  weeks with right-sided chest pain EXAM: CHEST  2 VIEW COMPARISON:  06/06/2016 FINDINGS: Left chest wall port is again identified. Scarring in the right apex is again seen extending towards the right hilum. A small air-fluid level is better delineated on the current exam and is felt  to represent a small hydro pneumothorax. Chronic changes in the right lung base are again identified similar to that seen on the prior exam. A small pleural effusion is present. No new focal infiltrate is seen. The left lung remains clear. IMPRESSION: Better delineated air-fluid level in the right apex consistent with a small hydro pneumothorax. It is likely loculated given its overall appearance and chronic changes in the right apex. Electronically Signed   By: Inez Catalina M.D.   On: 06/18/2016 14:26   Ct Chest Wo Contrast  Result Date: 06/18/2016 CLINICAL DATA:  Fever and shortness of breath. Recent pneumonia. Completed antibiotic therapy. Acute respiratory failure. Non-Hodgkin's lymphoma. Previous radiation therapy and chemotherapy. EXAM: CT CHEST WITHOUT CONTRAST TECHNIQUE: Multidetector CT imaging of the chest was performed following the standard protocol without IV contrast. COMPARISON:  09/05/2015 FINDINGS: Cardiovascular: No acute findings. Left subclavian Port-A-Cath remains in appropriate position. Aortic atherosclerosis. Coronary artery calcification. Mediastinum/Nodes: No masses or pathologically enlarged lymph nodes identified on this unenhanced exam. Lungs/Pleura: Paramediastinal radiation changes again seen with associated traction bronchiectasis involving right lung greater than left. Pleural thickening again seen in right upper lung zone. A new small right apical hydro-pneumothorax is seen. No evidence of lung mass. Increased heterogeneous ground-glass opacity is seen in the left upper lobe and lingula since previous study, which may be due to infectious or inflammatory process. No evidence of left-sided  pneumothorax or pleural effusion. Upper Abdomen:  Unremarkable. Musculoskeletal: No suspicious bone lesions or other significant abnormality. IMPRESSION: New small right apical hydro-pneumothorax. New heterogeneous ground-glass opacity in left upper lobe and lingula, suspicious for infectious or inflammatory process. Stable paramediastinal radiation changes with traction bronchiectasis involving right lung greater than left. No evidence of recurrent mass or lymphadenopathy within the thorax. Electronically Signed   By: Earle Gell M.D.   On: 06/18/2016 16:10    EKG:   Orders placed or performed during the hospital encounter of 06/18/16  . ED EKG  . ED EKG  . EKG 12-Lead  . EKG 12-Lead      Management plans discussed with the patient, family and they are in agreement.  CODE STATUS:     Code Status Orders        Start     Ordered   06/18/16 1822  Do not attempt resuscitation (DNR)  Continuous    Question Answer Comment  In the event of cardiac or respiratory ARREST Do not call a "code blue"   In the event of cardiac or respiratory ARREST Do not perform Intubation, CPR, defibrillation or ACLS   In the event of cardiac or respiratory ARREST Use medication by any route, position, wound care, and other measures to relive pain and suffering. May use oxygen, suction and manual treatment of airway obstruction as needed for comfort.      06/18/16 1821    Code Status History    Date Active Date Inactive Code Status Order ID Comments User Context   11/29/2014  2:05 PM 12/05/2014  3:50 PM DNR [id number]  Evlyn Kanner, NP Inpatient    Advance Directive Documentation   Flowsheet Row Most Recent Value  Type of Advance Directive  Living will  Pre-existing out of facility DNR order (yellow form or pink MOST form)  No data  "MOST" Form in Place?  No data      TOTAL TIME TAKING CARE OF THIS PATIENT: 35 minutes.    Vaughan Basta M.D on 06/20/2016 at 9:41 AM  Between 7am to 6pm -  Pager - [phone]  After 6pm go to [email or web address] - password [email or web address] [facility]  Office  [phone]  CC: Primary care physician; Madelyn Brunner, MD   Note: This dictation was prepared with Dragon dictation along with smaller phrase technology. Any transcriptional errors that result from this process are unintentional.

## 2016-06-23 LAB — CULTURE, BLOOD (ROUTINE X 2)
CULTURE: NO GROWTH
Culture: NO GROWTH

## 2016-06-25 ENCOUNTER — Inpatient Hospital Stay: Payer: Medicare Other | Attending: Internal Medicine | Admitting: Internal Medicine

## 2016-06-25 VITALS — BP 123/79 | HR 79 | Temp 95.0°F | Resp 18 | Wt 123.5 lb

## 2016-06-25 DIAGNOSIS — C8331 Diffuse large B-cell lymphoma, lymph nodes of head, face, and neck: Secondary | ICD-10-CM

## 2016-06-25 DIAGNOSIS — C8590 Non-Hodgkin lymphoma, unspecified, unspecified site: Secondary | ICD-10-CM

## 2016-06-25 DIAGNOSIS — J942 Hemothorax: Secondary | ICD-10-CM | POA: Diagnosis not present

## 2016-06-25 DIAGNOSIS — J9610 Chronic respiratory failure, unspecified whether with hypoxia or hypercapnia: Secondary | ICD-10-CM | POA: Insufficient documentation

## 2016-06-25 DIAGNOSIS — Z452 Encounter for adjustment and management of vascular access device: Secondary | ICD-10-CM | POA: Insufficient documentation

## 2016-06-25 DIAGNOSIS — J45909 Unspecified asthma, uncomplicated: Secondary | ICD-10-CM | POA: Diagnosis not present

## 2016-06-25 DIAGNOSIS — Z923 Personal history of irradiation: Secondary | ICD-10-CM | POA: Insufficient documentation

## 2016-06-25 DIAGNOSIS — D6481 Anemia due to antineoplastic chemotherapy: Secondary | ICD-10-CM | POA: Diagnosis not present

## 2016-06-25 DIAGNOSIS — Z9221 Personal history of antineoplastic chemotherapy: Secondary | ICD-10-CM

## 2016-06-25 DIAGNOSIS — Z9484 Stem cells transplant status: Secondary | ICD-10-CM | POA: Insufficient documentation

## 2016-06-25 DIAGNOSIS — Z7951 Long term (current) use of inhaled steroids: Secondary | ICD-10-CM

## 2016-06-25 DIAGNOSIS — Z79899 Other long term (current) drug therapy: Secondary | ICD-10-CM | POA: Insufficient documentation

## 2016-06-25 DIAGNOSIS — Z7982 Long term (current) use of aspirin: Secondary | ICD-10-CM | POA: Insufficient documentation

## 2016-06-25 DIAGNOSIS — D61818 Other pancytopenia: Secondary | ICD-10-CM | POA: Diagnosis not present

## 2016-06-25 NOTE — Progress Notes (Signed)
[name]  Patient Care Team: Madelyn Brunner, MD as PCP - General (Unknown Physician Specialty) Madelyn Brunner, MD as Referring Physician (Unknown Physician Specialty) Forest Gleason, MD (Unknown Physician Specialty) Christene Lye, MD (General Surgery)  No matching staging information was found for the patient.   Oncology History   1.  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  Status post 6 cycles of chemotherapy with CHOP9nd involved field radiation treatment 18,00 rads to the mantle field  and mediasinal boost 2340. finished radiation and chemotherapy in November of 2000 residual mediastinotomy mass 2 cm 2.  In May of 2001 progressive disease with mediastinal mass becoming bigger 8 cm3.  The patient underwent 2 cycles of chemotherapy withRituxan, and ICE.(June of 2001) [facility]and high-dose chemotherapy with  BEAM finished treatment in October of 2001. patient had significant pancytopenia as well as fever lasting for 3 months after transplant. Recurrent sinus infection and pulmonary infection 3.  Patient  underwent EUS biopsy of which was negative .  (November, 2012) 4.  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  Started on RICE october 2014 6.ppatient is now being considered for high-dose chemotherapy and stem cell support (March, 2015) 7.2nd stem cell transplant cannot be done because of the  no stem cell to harvest     Lymphoma, non-Hodgkin's ([place]   06/30/2013 Initial Diagnosis    Lymphoma, non-Hodgkin's       Diffuse large B-cell lymphoma of lymph nodes of neck ([place]   02/21/2016 Initial Diagnosis    DLBCL (diffuse large B cell lymphoma) ([place]      INTERVAL HISTORY:  [name]89y.o.  female pleasant patient above history of Relapsed diffuse large  B-cell lymphoma status post cell transplant; most currently in remission- is here for follow-up.   Patient was recently admitted to the hospital for worsening shortness of breath and low-grade fevers.; Font as spontaneous hydropneumothorax of the right side- evaluated by pulmonary/thoracic surgery. No surgical procedures performed  She is currently on antibiotics and prednisone. Fevers improved. No new lumps or bumps. No nausea or vomiting. No significant weight loss.  REVIEW OF SYSTEMS:  A complete 10 point review of system is done which is negative except mentioned above/history of present illness.   PAST MEDICAL HISTORY :  Past Medical History:  Diagnosis Date  . Acute respiratory failure ([place] 11/29/2014  . 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 Asthma   . Non Hodgkin's lymphoma ([place]     PAST SURGICAL HISTORY :   Past Surgical History:  Procedure Laterality Date  . 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 NASAL SINUS SURGERY  08/07/05 09/09/05   x 2   . VIDEO BRONCHOSCOPY Bilateral 11/30/2014   Procedure: VIDEO BRONCHOSCOPY WITHOUT FLUORO;  Surgeon: KFlora Lipps MD;  Location: [facility] ORS;  Service: Cardiopulmonary;  Laterality: Bilateral;    FAMILY HISTORY :   Family History  Problem Relation Age of Onset  . Asthma Son   . Congestive Heart Failure Mother   . Diabetes Brother     SOCIAL HISTORY:   Social History  Substance Use Topics  . Smoking status: Never Smoker  . Smokeless tobacco: Never Used  . Alcohol use Yes    ALLERGIES:  is allergic to hydrocodone-homatropine;  tussionex pennkinetic er [hydrocod polst-cpm polst er]; and ciprofloxacin.  MEDICATIONS:  Current Outpatient Prescriptions  Medication Sig Dispense Refill  . albuterol (PROVENTIL HFA;VENTOLIN HFA) 108 (90 BASE) MCG/ACT inhaler Inhale 1 puff into the lungs every 6 (six) hours as needed for wheezing or shortness of breath.    [name] albuterol (PROVENTIL) (2.5 MG/3ML) 0.083% nebulizer solution Take 3 mLs (2.5 mg  total) by nebulization every 4 (four) hours as needed for wheezing or shortness of breath. 75 mL 12  . aspirin EC 81 MG tablet Take 81 mg by mouth daily.    . citalopram (CELEXA) 20 MG tablet TAKE 1 TABLET BY MOUTH ONCE DAILY 90 tablet 3  . doxycycline (VIBRA-TABS) 100 MG tablet Take 1 tablet (100 mg total) by mouth 2 (two) times daily. 10 tablet 0  . fluticasone-salmeterol (ADVAIR HFA) 115-21 MCG/ACT inhaler Inhale 2 puffs into the lungs 2 (two) times daily. 1 Inhaler 12  . guaiFENesin (MUCINEX) 600 MG 12 hr tablet Take 1 tablet (600 mg total) by mouth 2 (two) times daily. 10 tablet 0  . levothyroxine (SYNTHROID, LEVOTHROID) 25 MCG tablet Take 1 tablet (25 mcg total) by mouth daily. 30 tablet 3  . omeprazole (PRILOSEC) 20 MG capsule Take 1 capsule (20 mg total) by mouth 2 (two) times daily before a meal. (Patient taking differently: Take 20 mg by mouth daily. ) 60 capsule 3  . ondansetron (ZOFRAN) 4 MG tablet Take 1 tablet (4 mg total) by mouth every 8 (eight) hours as needed for nausea or vomiting. 30 tablet 1  . potassium chloride SA (K-DUR,KLOR-CON) 20 MEQ tablet TAKE 1 TABLET BY MOUTH TWICE DAILY 30 tablet 1  . predniSONE (STERAPRED UNI-PAK 21 TAB) 10 MG (21) TBPK tablet Take 6 tabs first day, 5 tab on day 2, then 4 on day 3rd, 3 tabs on day 4th , 2 tab on day 5th, and 1 tab on 6th day. 21 tablet 0  . guaiFENesin-codeine 100-10 MG/5ML syrup     . lidocaine-prilocaine (EMLA) cream Apply 1 application topically as needed. (Patient not taking: Reported on 06/25/2016) 30 g 3   No current facility-administered medications for this visit.     PHYSICAL EXAMINATION: ECOG PERFORMANCE STATUS: 1 - Symptomatic but completely ambulatory  BP 123/79 (BP Location: Right Arm, Patient Position: Sitting)   Pulse 79   Temp (!) 95 F (35 C) (Tympanic)   Resp 18   Wt 123 lb 7.3 oz (56 kg)   BMI 23.33 kg/m   Filed Weights   06/25/16 1440  Weight: 123 lb 7.3 oz (56 kg)    GENERAL: Thin built moderately  nourished;  Alert, no distress and comfortable.   Accompanied by husband. EYES: no pallor or icterus OROPHARYNX: no thrush or ulceration; good dentition  NECK: supple, no masses felt LYMPH:  no palpable lymphadenopathy in the cervical, axillary or inguinal regions LUNGS: Bilateral coarse breath sounds to auscultation and  No wheeze or crackles HEART/CVS: regular rate & rhythm and no murmurs; No lower extremity edema ABDOMEN:abdomen soft, non-tender and normal bowel sounds Musculoskeletal:no cyanosis of digits and no clubbing  PSYCH: alert & oriented x 3 with fluent speech NEURO: no focal motor/sensory deficits SKIN:  no rashes or significant lesions  LABORATORY DATA:  I have reviewed the data as listed    Component Value Date/Time   NA 138 06/19/2016 0558   NA 140 11/16/2014 1415   K 4.0 06/19/2016 0558   K 3.3 (L) 11/16/2014 1415   CL 109 06/19/2016  0558   CL 107 11/16/2014 1415   CO2 23 06/19/2016 0558   CO2 29 11/16/2014 1415   GLUCOSE 146 (H) 06/19/2016 0558   GLUCOSE 117 (H) 11/16/2014 1415   BUN 22 (H) 06/19/2016 0558   BUN 31 (H) 11/16/2014 1415   CREATININE 0.98 06/19/2016 0558   CREATININE 1.19 (H) 11/16/2014 1415   CALCIUM 8.5 (L) 06/19/2016 0558   CALCIUM 9.1 11/16/2014 1415   PROT 6.8 06/18/2016 1440   PROT 6.8 11/16/2014 1415   ALBUMIN 3.3 (L) 06/18/2016 1440   ALBUMIN 3.8 11/16/2014 1415   AST 17 06/18/2016 1440   AST 17 11/16/2014 1415   ALT 14 06/18/2016 1440   ALT 11 (L) 11/16/2014 1415   ALKPHOS 56 06/18/2016 1440   ALKPHOS 115 11/16/2014 1415   BILITOT 0.8 06/18/2016 1440   BILITOT 0.4 11/16/2014 1415   GFRNONAA 56 (L) 06/19/2016 0558   GFRNONAA 46 (L) 11/16/2014 1415   GFRAA >60 06/19/2016 0558   GFRAA 54 (L) 11/16/2014 1415    No results found for: SPEP, UPEP  Lab Results  Component Value Date   WBC 11.9 (H) 06/20/2016   NEUTROABS 3.6 06/18/2016   HGB 11.0 (L) 06/20/2016   HCT 32.3 (L) 06/20/2016   MCV 97.8 06/20/2016   PLT 144 (L)  06/20/2016      Chemistry      Component Value Date/Time   NA 138 06/19/2016 0558   NA 140 11/16/2014 1415   K 4.0 06/19/2016 0558   K 3.3 (L) 11/16/2014 1415   CL 109 06/19/2016 0558   CL 107 11/16/2014 1415   CO2 23 06/19/2016 0558   CO2 29 11/16/2014 1415   BUN 22 (H) 06/19/2016 0558   BUN 31 (H) 11/16/2014 1415   CREATININE 0.98 06/19/2016 0558   CREATININE 1.19 (H) 11/16/2014 1415      Component Value Date/Time   CALCIUM 8.5 (L) 06/19/2016 0558   CALCIUM 9.1 11/16/2014 1415   ALKPHOS 56 06/18/2016 1440   ALKPHOS 115 11/16/2014 1415   AST 17 06/18/2016 1440   AST 17 11/16/2014 1415   ALT 14 06/18/2016 1440   ALT 11 (L) 11/16/2014 1415   BILITOT 0.8 06/18/2016 1440   BILITOT 0.4 11/16/2014 1415       RADIOGRAPHIC STUDIES: I have personally reviewed the radiological images as listed and agreed with the findings in the report. No results found.   ASSESSMENT & PLAN:  Diffuse large B-cell lymphoma of lymph nodes of neck ([place]) # DLBCL-status post relapse; status post autologous stem cell transplant.  last treatment in 2015; PET May 2016- NED. Clinically no evidence of recurrence  # Transient/mild pancytopenia- white count hemoglobin 11 platelets/120s; likely secondary to antibiotics as infection. Currently improved. Clinically I do not suspect any recurrence of lymphoma at this time. However if this continues to be worsening recommend a bone marrow biopsy/PET scan. For now continue surveillance.  # Chronic respiratory failure on oxygen; multiple lung infections; ? Bronchitis vs pneumonia. Recent chest CT showed- spontaneous right pneumothorax. Under surveillance   # Port flush as planned/CBC on December 15  # Follow-up with me as planned in February labs port flush.   Orders Placed This Encounter  Procedures  . CBC with Differential/Platelet    Standing Status:   Future    Standing Expiration Date:   06/25/2017   All questions were answered. The patient knows to  call the clinic with any problems, questions or concerns.      Lenetta Quaker  Ann Lions, MD 06/25/2016 4:32 PM

## 2016-06-25 NOTE — Assessment & Plan Note (Signed)
#  DLBCL-status post relapse; status post autologous stem cell transplant.  last treatment in 2015; PET May 2016- NED. Clinically no evidence of recurrence  # Transient/mild pancytopenia- white count hemoglobin 11 platelets/120s; likely secondary to antibiotics as infection. Currently improved. Clinically I do not suspect any recurrence of lymphoma at this time. However if this continues to be worsening recommend a bone marrow biopsy/PET scan. For now continue surveillance.  # Chronic respiratory failure on oxygen; multiple lung infections; ? Bronchitis vs pneumonia. Recent chest CT showed- spontaneous right pneumothorax. Under surveillance   # Port flush as planned/CBC on December 15  # Follow-up with me as planned in February labs port flush.

## 2016-06-25 NOTE — Progress Notes (Signed)
Here for f/u recently d/[facility] Tuesday nov 28 to Tursday nov 30 dx w infection and treated w iv abx and steroids .

## 2016-06-27 ENCOUNTER — Ambulatory Visit (INDEPENDENT_AMBULATORY_CARE_PROVIDER_SITE_OTHER): Payer: Medicare Other | Admitting: Internal Medicine

## 2016-06-27 ENCOUNTER — Encounter: Payer: Self-pay | Admitting: Internal Medicine

## 2016-06-27 VITALS — BP 112/68 | HR 91 | Wt 122.0 lb

## 2016-06-27 DIAGNOSIS — J9383 Other pneumothorax: Secondary | ICD-10-CM | POA: Diagnosis not present

## 2016-06-27 NOTE — Progress Notes (Signed)
[place] [facility] Consultation    Date: 06/27/2016  MRN# [id number] Anna Mcbride Apr 16, 1946  Referring Physician: Dr. Jeb Levering PMD - Dr. Gilford [facility]) Anna Mcbride is a 70 y.o. old female seen in consultation for recurrent pulmonary infection  CC:  Chief Complaint  Patient presents with  . Follow-up    SOB w/activity; cough some;    HPI:  OV 10/31 -Patient showing signs of infection, low grade fevers, productive cough yellow sputum, chest congestion +Wheezing, increased SOB,WOB   05/30/16 OV-patient feels much better today, coughing and chest congestion much improved Still has some wheezing, looks better than last week    Previous OV in the past Patient had previous recurrent respiratory tract infections requiring alternating doses of doxycycline and Levaquin    patient 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  CT chest 09/05/15 shows stable RUL infiltrate    OV 11/20 Patient went to ER on 11/16 for weakness and SOB and wheezing Patient was treated with IV abx, IV steroids and was given IVF's CXR did NOT show acute changes  Patient feels much better since ER visit On oral steroids and oral abx   OV 12/7 patient with recent admission for worsening SOB found to have Small RT sided PTX trreatd with oxygen, IV abx and IV steroids Follow up today, patient feeling better since admission No signs of infection at this time    Review of Chart ONCOLOGY History 1. 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 Status post 6 cycles of chemotherapy with CHOP9nd involved field radiation treatment 18,00 rads to the mantle field and mediasinal boost 2340. finished radiation and chemotherapy in November of  2000 residual mediastinotomy mass 2 cm 2. In May of 2001 progressive disease with mediastinal mass becoming bigger 8 cm3. The patient underwent 2 cycles of chemotherapy withRituxan, and ICE.(June of 2001) Followed by 81more cycles at [facility] with DHAP.and high-dose chemotherapy with BEAM finished treatment in October of 2001. patient had significant pancytopenia as well as fever lasting for 3 months after transplant. Recurrent sinus infection and pulmonary infection 3. Patient underwent EUS biopsy of which was negative . (November, 2012) 4. 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  HENT: Negative for congestion.   Eyes: Negative for blurred vision and double vision.  Respiratory: Negative for cough, sputum production, shortness of breath and wheezing.   Cardiovascular: Negative for chest pain, palpitations, orthopnea and leg swelling.  Gastrointestinal: Negative for abdominal pain, heartburn, nausea and vomiting.  Neurological: Negative for dizziness.  Psychiatric/Behavioral: Negative for depression.  All other systems reviewed and are negative.    Physical Examination:   BP 112/68 (BP Location: Right Arm, Cuff Size: Normal)   Pulse 91   Wt 122 lb (55.3 kg)   SpO2 99%   BMI 23.05 kg/m  General Appearance: no distress  Neuro:without focal findings, mental status, speech normal, alert and oriented, cranial nerves 2-12 intact, reflexes normal and symmetric, sensation grossly normal   HEENT: PERRLA, EOM intact, no ptosis, no other lesions noticed, right ear canal with no sig erythema, could not visualize the TM due to cerumen impaction ; Mallampati 2 Pulmonary: normal breath sounds., diaphragmatic excursion normal.-wheezing, - rales;   -Sputum Production:   CardiovascularNormal S1,S2.  No m/r/g.  Abdominal aorta pulsation normal.    Skin:   warm, no rashes, no ecchymosis  Extremities: normal, no cyanosis, clubbing, no edema, warm with normal capillary refill. Other findings:none   CT chest 09/05/15  Anna Mcbride: 1. Perihilar consolidation which extends into the RIGHT upper lobe with chronic bronchiectasis is most consistent with radiation change. 2. Ill-defined tissue planes in the mediastinum without clear evidence of nodal metastasis. 3. Some improvement in nodular airspace disease in the RIGHT lower lobe suggests improving chronic infectious process. 4. No convincing evidence of lung cancer recurrence.     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 albuterol nebs every 4 hrs 2.robitussin with codeine as needed 3.continue oxygen as needed 4.start Incruse and continue Advair   Has a history of diffuse large B-cell lymphoma, chronic infiltrates suspected to be from radiation pneumonitis, -Continue with Advair -Continue supplemental oxygen as needed -albuterol as needed   Follow up 3 months  The Patient requires high complexity decision making for assessment and support, frequent evaluation and titration of therapies. Patient/Family are satisfied with Plan of action and management. All questions answered  Anna Mcbride, M.D.  [facility]  Medical Director [place] Director [facility] Cardio-Pulmonary Department

## 2016-06-27 NOTE — Patient Instructions (Signed)
Continue Advair Start [employer] therapy as needed

## 2016-06-28 ENCOUNTER — Encounter: Payer: Self-pay | Admitting: Emergency Medicine

## 2016-06-28 ENCOUNTER — Emergency Department: Payer: No Typology Code available for payment source

## 2016-06-28 ENCOUNTER — Emergency Department
Admission: EM | Admit: 2016-06-28 | Discharge: 2016-06-28 | Disposition: A | Discharge status: Home / Self Care | Payer: No Typology Code available for payment source | Attending: Emergency Medicine | Admitting: Emergency Medicine

## 2016-06-28 ENCOUNTER — Other Ambulatory Visit: Payer: Self-pay

## 2016-06-28 DIAGNOSIS — S299XXA Unspecified injury of thorax, initial encounter: Secondary | ICD-10-CM | POA: Diagnosis present

## 2016-06-28 DIAGNOSIS — E039 Hypothyroidism, unspecified: Secondary | ICD-10-CM | POA: Insufficient documentation

## 2016-06-28 DIAGNOSIS — J45909 Unspecified asthma, uncomplicated: Secondary | ICD-10-CM | POA: Diagnosis not present

## 2016-06-28 DIAGNOSIS — S2232XA Fracture of one rib, left side, initial encounter for closed fracture: Secondary | ICD-10-CM | POA: Diagnosis not present

## 2016-06-28 DIAGNOSIS — S2222XA Fracture of body of sternum, initial encounter for closed fracture: Secondary | ICD-10-CM

## 2016-06-28 DIAGNOSIS — Y92410 Unspecified street and highway as the place of occurrence of the external cause: Secondary | ICD-10-CM | POA: Diagnosis not present

## 2016-06-28 DIAGNOSIS — Y939 Activity, unspecified: Secondary | ICD-10-CM | POA: Diagnosis not present

## 2016-06-28 DIAGNOSIS — Z7982 Long term (current) use of aspirin: Secondary | ICD-10-CM | POA: Diagnosis not present

## 2016-06-28 DIAGNOSIS — Y999 Unspecified external cause status: Secondary | ICD-10-CM | POA: Diagnosis not present

## 2016-06-28 MED ORDER — LORAZEPAM 0.5 MG PO TABS
0.5000 mg | ORAL_TABLET | Freq: Once | ORAL | Status: AC
  Administered 2016-06-28: 0.5 mg via ORAL
  Filled 2016-06-28: qty 1

## 2016-06-28 MED ORDER — OXYCODONE-ACETAMINOPHEN 5-325 MG PO TABS
1.0000 | ORAL_TABLET | Freq: Once | ORAL | Status: AC
  Administered 2016-06-28: 1 via ORAL
  Filled 2016-06-28: qty 1

## 2016-06-28 MED ORDER — OXYCODONE-ACETAMINOPHEN 5-325 MG PO TABS: 2.0000 | ORAL_TABLET | Freq: Four times a day (QID) | ORAL | 0 refills | Status: DC | PRN
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
## 2016-06-28 NOTE — ED Notes (Signed)
Patient transported to CT 

## 2016-07-02 ENCOUNTER — Ambulatory Visit: Attending: Internal Medicine | Primary: Internal Medicine

## 2016-07-02 DIAGNOSIS — E1165 Type 2 diabetes mellitus with hyperglycemia: Secondary | ICD-10-CM

## 2016-07-02 DIAGNOSIS — E785 Hyperlipidemia, unspecified: Secondary | ICD-10-CM

## 2016-07-02 DIAGNOSIS — E1142 Type 2 diabetes mellitus with diabetic polyneuropathy: Secondary | ICD-10-CM

## 2016-07-02 DIAGNOSIS — M1990 Unspecified osteoarthritis, unspecified site: Secondary | ICD-10-CM

## 2016-07-02 DIAGNOSIS — I10 Essential (primary) hypertension: Secondary | ICD-10-CM

## 2016-07-02 DIAGNOSIS — Z6841 Body Mass Index (BMI) 40.0 and over, adult: Secondary | ICD-10-CM

## 2016-07-02 DIAGNOSIS — K219 Gastro-esophageal reflux disease without esophagitis: Secondary | ICD-10-CM

## 2016-07-02 DIAGNOSIS — R0601 Orthopnea: Secondary | ICD-10-CM

## 2016-07-02 DIAGNOSIS — F419 Anxiety disorder, unspecified: Secondary | ICD-10-CM

## 2016-07-02 DIAGNOSIS — Z7189 Other specified counseling: Secondary | ICD-10-CM

## 2016-07-02 DIAGNOSIS — F329 Major depressive disorder, single episode, unspecified: Secondary | ICD-10-CM

## 2016-07-02 DIAGNOSIS — E119 Type 2 diabetes mellitus without complications: Principal | ICD-10-CM

## 2016-07-02 DIAGNOSIS — Z794 Long term (current) use of insulin: Secondary | ICD-10-CM

## 2016-07-02 DIAGNOSIS — N3941 Urge incontinence: Principal | ICD-10-CM

## 2016-07-02 NOTE — Progress Notes
Order hospital bed to alleivate her pain and help her breathing

## 2016-07-03 ENCOUNTER — Other Ambulatory Visit: Payer: Self-pay | Admitting: Internal Medicine

## 2016-07-03 ENCOUNTER — Ambulatory Visit: Payer: Medicare Other

## 2016-07-03 DIAGNOSIS — C8590 Non-Hodgkin lymphoma, unspecified, unspecified site: Secondary | ICD-10-CM

## 2016-07-04 ENCOUNTER — Inpatient Hospital Stay: Payer: Medicare Other

## 2016-07-04 DIAGNOSIS — C8331 Diffuse large B-cell lymphoma, lymph nodes of head, face, and neck: Secondary | ICD-10-CM | POA: Diagnosis not present

## 2016-07-04 DIAGNOSIS — C8590 Non-Hodgkin lymphoma, unspecified, unspecified site: Secondary | ICD-10-CM

## 2016-07-04 LAB — CBC WITH DIFFERENTIAL/PLATELET
BASOS ABS: 0 10*3/uL (ref 0–0.1)
Basophils Relative: 1 %
Eosinophils Absolute: 0.1 10*3/uL (ref 0–0.7)
Eosinophils Relative: 5 %
HEMATOCRIT: 35.2 % (ref 35.0–47.0)
Hemoglobin: 11.8 g/dL — ABNORMAL LOW (ref 12.0–16.0)
LYMPHS ABS: 0.4 10*3/uL — AB (ref 1.0–3.6)
LYMPHS PCT: 15 %
MCH: 32.9 pg (ref 26.0–34.0)
MCHC: 33.5 g/dL (ref 32.0–36.0)
MCV: 98.2 fL (ref 80.0–100.0)
Monocytes Absolute: 0.3 10*3/uL (ref 0.2–0.9)
Monocytes Relative: 12 %
NEUTROS ABS: 2 10*3/uL (ref 1.4–6.5)
Neutrophils Relative %: 67 %
Platelets: 114 10*3/uL — ABNORMAL LOW (ref 150–440)
RBC: 3.58 MIL/uL — AB (ref 3.80–5.20)
RDW: 15.7 % — ABNORMAL HIGH (ref 11.5–14.5)
WBC: 2.9 10*3/uL — AB (ref 3.6–11.0)

## 2016-07-04 MED ORDER — SODIUM CHLORIDE 0.9% FLUSH
10.0000 mL | INTRAVENOUS | Status: DC | PRN
  Administered 2016-07-04: 10 mL via INTRAVENOUS
  Filled 2016-07-04: qty 10

## 2016-07-04 MED ORDER — HEPARIN SOD (PORK) LOCK FLUSH 100 UNIT/ML IV SOLN
500.0000 [IU] | Freq: Once | INTRAVENOUS | Status: AC
  Administered 2016-07-04: 500 [IU] via INTRAVENOUS

## 2016-07-05 ENCOUNTER — Telehealth: Payer: Self-pay | Admitting: *Deleted

## 2016-07-05 NOTE — Telephone Encounter (Signed)
-----   Message from Cammie Sickle, MD sent at 07/05/2016  7:37 AM EST ----- Labs slightly abnormal/ overall stable no new recommendations. Follow-up as planned. Please inform pt.

## 2016-07-05 NOTE — Telephone Encounter (Signed)
Contacted patient. Explained her lab values from yesterday. Explained to patient that her Labs currently stable and she was instructed to keep her sch. apts.

## 2016-07-08 ENCOUNTER — Telehealth: Payer: Self-pay | Admitting: Internal Medicine
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
## 2016-07-09 NOTE — Progress Notes
Importance of monitoring blood sugars and recording results is discussed at length.  Patient has materials for keeping records and for measuring.  Educational materials regarding diabetes is given to patient.   Patient is made aware of the importance of monitoring HgbA1c every three to six months, having a yearly dilated eye exam, checking feet regularly for damage to the skin, monitoring cholesterol, seeing an Opthalmologist yearly and maintaining a diet consistent with diabetes care.  Patient given educational material regarding diabetes.    Patient advised to avoid driving or operating machinery when she is under the influence of these controlled substances.    Health Maintenance was reviewed. The patient's HM Topic list was:                                            Health Maintenance   Topic Date Due   ? DTaP,Tdap,and Td Vaccines (1 - Tdap) 08/23/2016 (Originally 11/17/1964)   ? Pneumovax / Prevnar (1 of 2 - PCV13) 08/23/2016 (Originally 11/16/2008)   ? Preventive Wellness Visit  09/30/2016 (Originally 11/16/1963)   ? Zoster Vaccine (1) 09/30/2016 (Originally 11/17/2003)   ? Diabetic Eye Exam  08/17/2016   ? Diabetic Foot Exam  10/23/2016   ? Hemoglobin A1C  11/01/2016   ? Lipid Profile  02/06/2017   ? Urine Protein  05/03/2017   ? Creatinine  05/03/2017   ? Basic Metabolic Panel  05/03/2017   ? Breast Cancer Screening  10/16/2017   ? Dexa  04/18/2025   ? Colon Cancer Screening  06/04/2026   ? USPSTF Hepatitis C Screening  Completed   ? Influenza Vaccine  Addressed

## 2016-07-09 NOTE — Progress Notes
DAILY.   ? GLOBAL INJECT EASE INSULIN SYR 31G X 5/16" 1 ML Miscellaneous USE ONE TWICE A DAY      .[name].THANK YOU..   ? hydroCHLOROthiazide (HYDRODIURIL) 25 MG Tablet TAKE ONE TABLET EVERY DAYFOR FLUIDS .[name].THANK YOU..   ? insulin lispro protamine-lispro (HumaLOG 75/25) vial 50 units qam 35 min before breakfast and 38 unit qpm 30 min before supper   ? lisinopril (PRINIVIL,ZESTRIL) 10 MG Tablet TAKE ONE TABLET EVERY DAY.[name].THANK YOU..   ? meclizine (ANTIVERT) 25 MG Tablet TAKE ONE OR TWO TABLETS  EVERY EIGHT HOURS .[name].THANKYOU..   ? nystatin (MYCOSTATIN) 100000 UNIT/ML Suspension Take 5 mLs by mouth 4 times daily.   ? oxybutynin (DITROPAN) 5 MG Tablet TAKE TWO TABLETS BY MOUTHTWICE A DAY .[name].THANK YOU..   ? pravastatin (PRAVACHOL) 40 MG Tablet TAKE ONE TABLET AT       BEDTIME .[name].THANK YOU..   ? PROAIR HFA 108 (90 BASE) MCG/ACT Aerosol Solution Inhale 2 puffs every 6 hours as needed.   ? raNITIdine (ZANTAC) 150 MG Tablet TAKE ONE TABLET TWICE A  DAY ##THANK YOU##   ? tiZANidine (ZANAFLEX) 2 MG Tablet TAKE TWO TABLETS EVERY   EIGHT HOURS (ONLY WHEN   NEEDED) ##THANK YOU##   ? torsemide (DEMADEX) 20 MG Tablet TAKE ONE TABLET EVERY    MORNING ##THANK YOU##   ? VITAMIN D-2 50000 UNITS Capsule TAKE ONE CAPSULE BY MOUTHTWO TIMES PER WEEK FOR 6 WEEKS .[name].THANK YOU.[name].     No current facility-administered medications on file prior to visit.      Allergies   Allergen Reactions   ? Asa [Aspirin] Itching   ? Pcn [Penicillins] Itching   ? Sulfa Drugs Itching         Review of Systems  Review of Systems   Constitutional: Negative for chills and fever.   HENT: Negative for congestion, postnasal drip, rhinorrhea and sinus pressure.    Eyes: Negative.    Respiratory: Positive for shortness of breath. Negative for cough and wheezing.    Cardiovascular: Positive for leg swelling. Negative for palpitations.        Right leg   Gastrointestinal: Negative.  Negative for abdominal pain, constipation, diarrhea, nausea and vomiting.   Endocrine: Negative.

## 2016-07-09 NOTE — Progress Notes
Genitourinary: Negative for dysuria, frequency and urgency.   Musculoskeletal: Positive for arthralgias and myalgias.   Allergic/Immunologic: Negative.    Neurological: Negative.    Hematological: Negative.    Psychiatric/Behavioral: Positive for decreased concentration.         Objective:        VITAL SIGNS (all recorded)      Clinic Vitals       07/03/16 0938             Amb Encounter Vitals    Weight 115.5 kg (254 lb 9.6 oz)    -TD at 07/03/16 0939       Height 1.626 m (5\' 4" )    -TD at 07/03/16 0939       BMI (Calculated) 43.79    -TD at 07/03/16 0939       BSA (Calculated - sq m) 2.28    -TD at 07/03/16 0939       BP 132/80    -TD at 07/03/16 0939       BP Location Left upper arm    -TD at 07/03/16 [id number]       Position Sitting    -TD at 07/03/16 0939       Pulse 77    -TD at 07/03/16 0939       Pulse Source Radial    -TD at 07/03/16 0939       Resp 16    -TD at 07/03/16 0939       Temp 37 ?C (98.6 ?F)    -TD at 07/03/16 0939       Temperature Source Tympanic    -TD at 07/03/16 0939       O2 Saturation 98 %    -TD at 07/03/16 0939       FiO2 Source RA    -TD at 07/03/16 0939       Pain Score Five    -TD at 07/03/16 [id number]       Location legs    -TD at 07/03/16 [id number]       Education/Communication Barriers?    Learning/Communication Barriers? No    -TD at 07/03/16 [id number]       Fall Risk Assessment    Had recent fall / Last 6 months? No recent fall    -TD at 07/03/16 [id number]       Does patient have a fear of falling? No    -TD at 07/03/16 [id number]         User Key  (r) = Recorded By, (t) = Taken By, (c) = Cosigned By    Initials Name Effective Dates    TD Duffy, Tammy, MA 10/24/15 -         Physical Exam   Constitutional: She is oriented to person, place, and time. She appears well-developed and well-nourished.   HENT:   Head: Normocephalic and atraumatic.   Right Ear: External ear normal.   Left Ear: External ear normal.   Nose: Nose normal.   Mouth/Throat: Oropharynx is clear and moist.

## 2016-07-09 NOTE — Progress Notes
Eyes: Conjunctivae and EOM are normal. Pupils are equal, round, and reactive to light.   Neck: Normal range of motion. Neck supple.   Cardiovascular: Normal rate, regular rhythm, normal heart sounds and intact distal pulses.    Pulmonary/Chest: Effort normal and breath sounds normal.   Abdominal: Soft. Bowel sounds are normal. There is no tenderness.   Musculoskeletal: Normal range of motion. She exhibits edema and tenderness.   Allover joints, 1+ edema right leg   Neurological: She is alert and oriented to person, place, and time. She has normal reflexes.   Skin: Skin is warm and dry.   Psychiatric: She has a normal mood and affect. Her behavior is normal. Judgment and thought content normal. Cognition and memory are impaired. She exhibits abnormal recent memory.   Nursing note and vitals reviewed.  Foot Exam: foot deformity Pes Planus bilaterally      Assessment:       ICD-10-CM ICD-9-CM    1. Urge incontinence of urine N39.41 788.31    2. Advanced directives, counseling/discussion Z71.89 V65.49    3. BMI 40.0-44.9, adult Z68.41 V85.41    4. Uncontrolled type 2 diabetes mellitus with diabetic polyneuropathy, with long-term current use of insulin E11.42 250.62     Z79.4 357.2     E11.65 V58.67    5. Orthopnea R06.01 786.02           Plan:   Order hospital bed  Continue current meds.  Diet and exercise as long patient can tolerate.  Salt restrictions.  FU with other MDs as scheduled.  Meds benefits and side effects discussed with patient. Patient sounds understanding.  No orders of the following type(s) were placed in this encounter: Medications.     No orders of the following type(s) were placed in this encounter: Procedures  Teaching re:  DM: untreated elevations of blood sugar can lead to problems with eyes, nerves, kidneys, heart, blood vessels, and integrity of feet.  Discussed pathophysiology of pancreas, insulin, insulin resistance, effect of obesity and smoking, concept of syndrome X

## 2016-07-09 NOTE — Progress Notes
Subjective:   Destiny RedwoodMary Pittman is a 70 y.o. female being seen today for Follow-up       HPI  Patient having orthopnea at night can't lay flat, get extremely SOB, we will order her a hospital bed to help her. DM is stable, still can do better , not good complient with her diet. CO of incontinence, using myrbitriq which is helping well, needs refill  Past Medical History:   Diagnosis Date   ? Anxiety    ? Arthritis    ? Depression    ? Diabetes mellitus    ? GERD (gastroesophageal reflux disease)    ? Hyperlipidemia    ? Hypertension      Past Surgical History:   Procedure Laterality Date   ? HYSTERECTOMY       Family History   Problem Relation Age of Onset   ? No Known Problems Mother    ? No Known Problems Father      Social History     Social History   ? Marital status: Single     Spouse name: N/A   ? Number of children: N/A   ? Years of education: N/A     Occupational History   ? Not on file.     Social History Main Topics   ? Smoking status: Never Smoker   ? Smokeless tobacco: Never Used   ? Alcohol use No   ? Drug use: No   ? Sexual activity: No     Other Topics Concern   ? Not on file     Social History Narrative     Current Outpatient Prescriptions on File Prior to Visit   Medication Sig   ? ALPRAZolam (XANAX) 0.5 MG tablet Take 1 Tablet by mouth 2 times daily.   ? diclofenac (VOLTAREN GEL) 1 % Gel topical gel APPLY 4 GM TOPICALLY FOURTIMES A DAY **MEASURE    DOSE USING ENCLOSED DOSE CARD*   ? donepezil (ARICEPT) 5 MG Tablet TAKE ONE TABLET BY MOUTH AT BEDTIME .[name].THANK YOU..   ? esomeprazole (NexIUM) 40 MG Capsule Delayed Release TAKE ONE CAPSULE EVERY   DAY .[name].THANK YOU..   ? fluticasone (FLONASE) 50 MCG/ACT Suspension SPRAY ONE PUFF INTO NOSE TWICE A DAY ==SHAKE      GENTLY==   ? fluticasone-salmeterol (ADVAIR DISKUS) 250-50 MCG/DOSE Aerosol Powder Breath Activated Inhale 1 puff 2 times daily. Rinse mouth well and spit after each use.   ? gabapentin (NEURONTIN) 400 MG Capsule TAKE 2 CAPSULES BY MOUTH 3 TIMES

## 2016-07-09 NOTE — Telephone Encounter (Signed)
Pt aware of DK recommendations. I advised pt to contact us with any questions or concerns before her apt. Pt voiced understanding and had no further questions. Nothing further needed.

## 2016-07-09 NOTE — Telephone Encounter (Signed)
I am ok with 1/8
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
## 2016-07-12 ENCOUNTER — Telehealth: Payer: Self-pay | Admitting: Internal Medicine

## 2016-07-12 MED ORDER — UMECLIDINIUM BROMIDE 62.5 MCG/INH IN AEPB: 1.0000 | INHALATION_SPRAY | Freq: Every day | RESPIRATORY_TRACT | 1 refills | Status: DC

## 2016-07-12 NOTE — Telephone Encounter (Signed)
Refill has been sent to the pharmacy and nothing further is needed.  

## 2016-07-12 NOTE — Telephone Encounter (Signed)
Pt spouse calling needs [facility] called to [facility] in [place].

## 2016-07-16 ENCOUNTER — Telehealth: Payer: Self-pay | Admitting: Internal Medicine

## 2016-07-16 MED ORDER — UMECLIDINIUM BROMIDE 62.5 MCG/INH IN AEPB: 1.0000 | INHALATION_SPRAY | Freq: Every day | RESPIRATORY_TRACT | 5 refills | Status: DC

## 2016-07-16 NOTE — Telephone Encounter (Signed)
°*  STAT* If patient is at the pharmacy, call can be transferred to refill team.   1. Which medications need to be refilled? (please list name of each medication and dose if known) Inhaler that was given as sample   2. Which pharmacy/location (including street and city if local pharmacy) is medication to be sent to? [facility]   3. Do they need a 30 day or 90 day supply? 90 day

## 2016-07-16 NOTE — Telephone Encounter (Signed)
Pt requesting refill on incruse to [facility].  This has been sent. Nothing further needed.

## 2016-07-29 ENCOUNTER — Ambulatory Visit (INDEPENDENT_AMBULATORY_CARE_PROVIDER_SITE_OTHER): Payer: Medicare Other | Admitting: Internal Medicine

## 2016-07-29 ENCOUNTER — Encounter: Payer: Self-pay | Admitting: Internal Medicine

## 2016-07-29 VITALS — BP 112/68 | HR 104 | Wt 121.0 lb

## 2016-07-29 DIAGNOSIS — J9611 Chronic respiratory failure with hypoxia: Secondary | ICD-10-CM

## 2016-07-29 MED ORDER — FLUTTER DEVI: 0 refills | Status: AC

## 2016-07-29 MED ORDER — PREDNISONE 1 MG PO TABS: 20.0000 mg | ORAL_TABLET | Freq: Every day | ORAL | 1 refills | Status: DC

## 2016-07-29 MED ORDER — AMBULATORY NON FORMULARY MEDICATION: 0 refills | Status: DC

## 2016-07-29 NOTE — Patient Instructions (Signed)
Start prednisone 40 mg daily for 7 days incentive spirometry 10-15 times per day Flutter valve 10-15 times per day
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
## 2016-07-30 ENCOUNTER — Ambulatory Visit: Attending: Internal Medicine | Primary: Internal Medicine

## 2016-07-30 DIAGNOSIS — I10 Essential (primary) hypertension: Principal | ICD-10-CM

## 2016-07-30 DIAGNOSIS — E7849 Other hyperlipidemia: Secondary | ICD-10-CM

## 2016-07-30 DIAGNOSIS — E119 Type 2 diabetes mellitus without complications: Secondary | ICD-10-CM

## 2016-07-30 DIAGNOSIS — Z1159 Encounter for screening for other viral diseases: Secondary | ICD-10-CM

## 2016-07-30 NOTE — Progress Notes
Reason:  Physician ordered labs  Amount:  1 marble,1 lavender Tubes  Type:  Butterfly  Site:  Vein  right arm  Reaction:  None    Draw performed by:  [id number],  07/30/2016

## 2016-08-02 ENCOUNTER — Ambulatory Visit: Attending: Internal Medicine | Primary: Internal Medicine

## 2016-08-02 DIAGNOSIS — M1990 Unspecified osteoarthritis, unspecified site: Secondary | ICD-10-CM

## 2016-08-02 DIAGNOSIS — F329 Major depressive disorder, single episode, unspecified: Secondary | ICD-10-CM

## 2016-08-02 DIAGNOSIS — Z6841 Body Mass Index (BMI) 40.0 and over, adult: Secondary | ICD-10-CM

## 2016-08-02 DIAGNOSIS — E1165 Type 2 diabetes mellitus with hyperglycemia: Secondary | ICD-10-CM

## 2016-08-02 DIAGNOSIS — E785 Hyperlipidemia, unspecified: Secondary | ICD-10-CM

## 2016-08-02 DIAGNOSIS — I10 Essential (primary) hypertension: Secondary | ICD-10-CM

## 2016-08-02 DIAGNOSIS — F419 Anxiety disorder, unspecified: Secondary | ICD-10-CM

## 2016-08-02 DIAGNOSIS — K219 Gastro-esophageal reflux disease without esophagitis: Secondary | ICD-10-CM

## 2016-08-02 DIAGNOSIS — Z7189 Other specified counseling: Principal | ICD-10-CM

## 2016-08-02 DIAGNOSIS — E1142 Type 2 diabetes mellitus with diabetic polyneuropathy: Secondary | ICD-10-CM

## 2016-08-02 DIAGNOSIS — Z794 Long term (current) use of insulin: Secondary | ICD-10-CM

## 2016-08-02 DIAGNOSIS — E7849 Other hyperlipidemia: Secondary | ICD-10-CM

## 2016-08-02 DIAGNOSIS — E119 Type 2 diabetes mellitus without complications: Principal | ICD-10-CM

## 2016-08-02 MED ORDER — INSULIN LISPRO PROT & LISPRO (75-25) 100 UNIT/ML SC SUSP
5 refills | Status: CP
Start: 2016-08-02 — End: 2016-09-02

## 2016-08-02 NOTE — Progress Notes
?   USPSTF Hepatitis C Screening  Completed   ? Influenza Vaccine  Addressed

## 2016-08-02 NOTE — Progress Notes
Constitutional: She is oriented to person, place, and time. She appears well-developed and well-nourished.   HENT:   Head: Normocephalic and atraumatic.   Right Ear: External ear normal.   Left Ear: External ear normal.   Nose: Nose normal.   Mouth/Throat: Oropharynx is clear and moist.   Eyes: Conjunctivae and EOM are normal. Pupils are equal, round, and reactive to light.   Neck: Normal range of motion. Neck supple.   Cardiovascular: Normal rate, regular rhythm, normal heart sounds and intact distal pulses.    Pulmonary/Chest: Effort normal and breath sounds normal.   Abdominal: Soft. Bowel sounds are normal. There is no tenderness.   Musculoskeletal: Normal range of motion. She exhibits edema and tenderness.   Allover joints, 1+ edema right leg   Neurological: She is alert and oriented to person, place, and time. She has normal reflexes.   Skin: Skin is warm and dry.   Psychiatric: She has a normal mood and affect. Her behavior is normal. Judgment and thought content normal. Cognition and memory are impaired. She exhibits abnormal recent memory.   Nursing note and vitals reviewed.  Foot Exam: foot deformity Pes Planus bilaterally      Assessment:       ICD-10-CM ICD-9-CM    1. Advanced directives, counseling/discussion Z71.89 V65.49    2. BMI 40.0-44.9, adult Z68.41 V85.41    3. Uncontrolled type 2 diabetes mellitus with diabetic polyneuropathy, with long-term current use of insulin E11.42 250.62 insulin lispro protamine-lispro (HumaLOG 75/25) vial    Z79.4 357.2     E11.65 V58.67    4. Other hyperlipidemia E78.4 272.4    5. Essential hypertension, benign I10 401.1           Plan:    increase insulin  Continue current meds.  Diet and exercise as long patient can tolerate.  Salt restrictions.  FU with other MDs as scheduled.  Meds benefits and side effects discussed with patient. Patient sounds understanding.  Orders Placed This Encounter   Medications   ? insulin lispro protamine-lispro (HumaLOG 75/25) vial

## 2016-08-02 NOTE — Progress Notes
GLU 149 (H) 07/30/2016    CALCIUM 9.6 07/30/2016    TPROT 7.0 07/30/2016    ALB 4.0 07/30/2016    AST 13 07/30/2016    ALT 12 07/30/2016    TBILI <0.2 07/30/2016    ALKPHOS 96 07/30/2016   , Lipid Profile    Lab Results   Component Value Date/Time    CHOL 173 07/30/2016 01:16 PM    CHOL 161 07/04/2014    TRIG 399 (H) 07/30/2016 01:16 PM    HDL 34 (L) 07/30/2016 01:16 PM    LDL 59 07/30/2016 01:16 PM    and HGBA1C:    Lab Results   Component Value Date/Time    HGBA1C 9.0 (H) 07/30/2016 01:16 PM       Past Medical History:   Diagnosis Date   ? Anxiety    ? Arthritis    ? Depression    ? Diabetes mellitus    ? GERD (gastroesophageal reflux disease)    ? Hyperlipidemia    ? Hypertension      Past Surgical History:   Procedure Laterality Date   ? HYSTERECTOMY       Family History   Problem Relation Age of Onset   ? No Known Problems Mother    ? No Known Problems Father      Social History     Social History   ? Marital status: Single     Spouse name: N/A   ? Number of children: N/A   ? Years of education: N/A     Occupational History   ? Not on file.     Social History Main Topics   ? Smoking status: Never Smoker   ? Smokeless tobacco: Never Used   ? Alcohol use No   ? Drug use: No   ? Sexual activity: No     Other Topics Concern   ? Not on file     Social History Narrative     Current Outpatient Prescriptions on File Prior to Visit   Medication Sig   ? ALPRAZolam (XANAX) 0.5 MG tablet Take 1 Tablet by mouth 2 times daily.   ? diclofenac (VOLTAREN GEL) 1 % Gel topical gel APPLY 4 GM TOPICALLY FOURTIMES A DAY **MEASURE    DOSE USING ENCLOSED DOSE CARD*   ? donepezil (ARICEPT) 5 MG Tablet TAKE ONE TABLET BY MOUTH AT BEDTIME .[name].THANK YOU..   ? esomeprazole (NexIUM) 40 MG Capsule Delayed Release TAKE ONE CAPSULE EVERY   DAY .[name].THANK YOU..   ? fluticasone (FLONASE) 50 MCG/ACT Suspension SPRAY ONE PUFF INTO NOSE TWICE A DAY ==SHAKE      GENTLY==   ? fluticasone-salmeterol (ADVAIR DISKUS) 250-50 MCG/DOSE Aerosol Powder

## 2016-08-02 NOTE — Progress Notes
Breath Activated Inhale 1 puff 2 times daily. Rinse mouth well and spit after each use.   ? gabapentin (NEURONTIN) 400 MG Capsule TAKE 2 CAPSULES BY MOUTH 3 TIMES DAILY.   ? GLOBAL INJECT EASE INSULIN SYR 31G X 5/16" 1 ML Miscellaneous USE ONE TWICE A DAY      .[name].THANK YOU..   ? hydroCHLOROthiazide (HYDRODIURIL) 25 MG Tablet TAKE ONE TABLET EVERY DAYFOR FLUIDS .[name].THANK YOU..   ? [DISCONTINUED] insulin lispro protamine-lispro (HumaLOG 75/25) vial 50 units qam 35 min before breakfast and 38 unit qpm 30 min before supper   ? lisinopril (PRINIVIL,ZESTRIL) 10 MG Tablet TAKE ONE TABLET EVERY DAY.[name].THANK YOU..   ? meclizine (ANTIVERT) 25 MG Tablet TAKE ONE OR TWO TABLETS  EVERY EIGHT HOURS .[name].THANKYOU..   ? nystatin (MYCOSTATIN) 100000 UNIT/ML Suspension Take 5 mLs by mouth 4 times daily.   ? oxybutynin (DITROPAN) 5 MG Tablet TAKE TWO TABLETS BY MOUTHTWICE A DAY .[name].THANK YOU..   ? pravastatin (PRAVACHOL) 40 MG Tablet TAKE ONE TABLET AT       BEDTIME .[name].THANK YOU..   ? PROAIR HFA 108 (90 BASE) MCG/ACT Aerosol Solution Inhale 2 puffs every 6 hours as needed.   ? raNITIdine (ZANTAC) 150 MG Tablet TAKE ONE TABLET TWICE A  DAY ##THANK YOU##   ? tiZANidine (ZANAFLEX) 2 MG Tablet TAKE TWO TABLETS EVERY   EIGHT HOURS (ONLY WHEN   NEEDED) ##THANK YOU##   ? torsemide (DEMADEX) 20 MG Tablet TAKE ONE TABLET EVERY    MORNING ##THANK YOU##   ? VITAMIN D-2 50000 UNITS Capsule TAKE ONE CAPSULE BY MOUTHTWO TIMES PER WEEK FOR 6 WEEKS .[name].THANK YOU.[name].     No current facility-administered medications on file prior to visit.      Allergies   Allergen Reactions   ? Asa [Aspirin] Itching   ? Pcn [Penicillins] Itching   ? Sulfa Drugs Itching         Review of Systems  Review of Systems   Constitutional: Positive for fatigue. Negative for chills, fever and weight loss.   HENT: Negative for congestion, postnasal drip, rhinorrhea and sinus pressure.    Eyes: Negative.  Negative for blurred vision.   Respiratory: Negative.  Negative for cough and wheezing.

## 2016-08-02 NOTE — Progress Notes
Sig: 55 units qam 30 min before breakfast and 38 unit qpm 30 min before supper     Dispense:  60 vial     Refill:  5     NEEDS REFILLS     No orders of the following type(s) were placed in this encounter: Procedures  Teaching re:  DM: untreated elevations of blood sugar can lead to problems with eyes, nerves, kidneys, heart, blood vessels, and integrity of feet.  Discussed pathophysiology of pancreas, insulin, insulin resistance, effect of obesity and smoking, concept of syndrome X  Importance of monitoring blood sugars and recording results is discussed at length.  Patient has materials for keeping records and for measuring.  Educational materials regarding diabetes is given to patient.   Patient is made aware of the importance of monitoring HgbA1c every three to six months, having a yearly dilated eye exam, checking feet regularly for damage to the skin, monitoring cholesterol, seeing an Opthalmologist yearly and maintaining a diet consistent with diabetes care.  Patient given educational material regarding diabetes.    Patient advised to avoid driving or operating machinery when she is under the influence of these controlled substances.    Health Maintenance was reviewed. The patient's HM Topic list was:                                            Health Maintenance   Topic Date Due   ? DTaP,Tdap,and Td Vaccines (1 - Tdap) 08/23/2016 (Originally 11/17/1964)   ? Pneumovax / Prevnar (1 of 2 - PCV13) 08/23/2016 (Originally 11/16/2008)   ? Preventive Wellness Visit  09/30/2016 (Originally 11/16/1963)   ? Zoster Vaccine (1) 09/30/2016 (Originally 11/17/2003)   ? Diabetic Eye Exam  08/17/2016   ? Diabetic Foot Exam  10/23/2016   ? Hemoglobin A1C  01/27/2017   ? Lipid Profile  07/30/2017   ? Urine Microalbumin  07/30/2017   ? Creatinine  07/30/2017   ? Basic Metabolic Panel  07/30/2017   ? Breast Cancer Screening  10/16/2017   ? Dexa  04/18/2025   ? Colon Cancer Screening  06/04/2026

## 2016-08-02 NOTE — Progress Notes
Cardiovascular: Positive for leg swelling. Negative for chest pain and palpitations.        Right leg   Gastrointestinal: Negative.  Negative for abdominal pain, constipation, diarrhea, nausea and vomiting.   Endocrine: Positive for polyphagia and polyuria. Negative for polydipsia.   Genitourinary: Negative for dysuria, frequency and urgency.   Musculoskeletal: Positive for arthralgias and myalgias.   Allergic/Immunologic: Negative.    Neurological: Negative.  Negative for weakness.   Hematological: Negative.    Psychiatric/Behavioral: Positive for decreased concentration.         Objective:        VITAL SIGNS (all recorded)      Clinic Vitals       08/02/16 1333             Amb Encounter Vitals    Weight 117.2 kg (258 lb 6.4 oz)    -TD at 08/02/16 1334       Height 1.626 m (5\' 4" )    -TD at 08/02/16 1334       BMI (Calculated) 44.45    -TD at 08/02/16 1334       BSA (Calculated - sq m) 2.3    -TD at 08/02/16 1334       BP 146/72    -TD at 08/02/16 1334       BP Location Left upper arm    -TD at 08/02/16 1334       Position Sitting    -TD at 08/02/16 1334       Pulse 86    -TD at 08/02/16 1334       Pulse Source Radial    -TD at 08/02/16 1334       Resp 16    -TD at 08/02/16 1334       Temp 36.5 ?C (97.7 ?F)    -TD at 08/02/16 1334       Temperature Source Oral    -TD at 08/02/16 1334       O2 Saturation 98 %    -TD at 08/02/16 1334       FiO2 Source RA    -TD at 08/02/16 1334       Pain Score Zero    -TD at 08/02/16 1334       Education/Communication Barriers?    Learning/Communication Barriers? No    -TD at 08/02/16 1334       Fall Risk Assessment    Had recent fall / Last 6 months? No recent fall    -TD at 08/02/16 1334       Does patient have a fear of falling? No    -TD at 08/02/16 1334         User Key  (r) = Recorded By, (t) = Taken By, (c) = Cosigned By    Initials Name Effective Dates    TD Duffy, Tammy, MA 10/24/15 -         Physical Exam

## 2016-08-02 NOTE — Progress Notes
Subjective:   Destiny Pittman is a 71 y.o. female being seen today for Follow up: Lab Results    Patient is here to discuss lab, lab wnl except a1c is up from 8.5 to 9 secondary to non compliance, LDL wnl but trigl is about 400  Diabetes   She presents for her follow-up diabetic visit. She has type 2 diabetes mellitus. No MedicAlert identification noted. Her disease course has been worsening. There are no hypoglycemic associated symptoms. Associated symptoms include fatigue, foot paresthesias, polyphagia and polyuria. Pertinent negatives for diabetes include no blurred vision, no chest pain, no foot ulcerations, no polydipsia, no visual change, no weakness and no weight loss. There are no hypoglycemic complications. Symptoms are worsening. Diabetic complications include autonomic neuropathy and peripheral neuropathy. Risk factors for coronary artery disease include diabetes mellitus, dyslipidemia, hypertension, obesity, post-menopausal and sedentary lifestyle. She is compliant with treatment most of the time. Her weight is increasing steadily. She is following a high fat/cholesterol and generally unhealthy diet. When asked about meal planning, she reported none. She has not had a previous visit with a dietitian. She never participates in exercise. Her home blood glucose trend is increasing steadily. She sees a podiatrist.Eye exam is current.     CBC (with or without Differential):   Lab Results   Component Value Date    WBC 6.8 07/30/2016    HGB 11.1 07/30/2016    HCT 35.1 07/30/2016    MCV 82 07/30/2016    MCH 25.9 (L) 07/30/2016    MCHC 31.6 07/30/2016    RDW 15.3 07/30/2016    PLATCOUNT 307 07/30/2016    NEUTROPCT 65 07/30/2016    LYMPHPCT 24 07/30/2016    MONOPCT 5 07/30/2016    EOSPCT 5 07/30/2016    BASOPCT 1 07/30/2016   , BMP/CMP:   Lab Results   Component Value Date    NA 136 07/30/2016    K 5.1 07/30/2016    CL 91 (L) 07/30/2016    CO2 26 07/30/2016    BUN 32 (H) 07/30/2016    CREATININE 1.31 (H) 07/30/2016

## 2016-08-08 ENCOUNTER — Ambulatory Visit: Payer: Medicare Other

## 2016-08-18 ENCOUNTER — Other Ambulatory Visit: Payer: Self-pay | Admitting: Internal Medicine

## 2016-08-19 DIAGNOSIS — M791 Myalgia, unspecified site: Secondary | ICD-10-CM

## 2016-08-19 DIAGNOSIS — J449 Chronic obstructive pulmonary disease, unspecified: Principal | ICD-10-CM

## 2016-08-20 MED ORDER — TIZANIDINE HCL 2 MG PO TABS
0 refills | Status: CP
Start: 2016-08-20 — End: 2016-09-22

## 2016-08-20 MED ORDER — PROAIR HFA 108 (90 BASE) MCG/ACT IN AERS
1 refills | Status: CP
Start: 2016-08-20 — End: 2016-11-06

## 2016-08-22 ENCOUNTER — Inpatient Hospital Stay: Payer: Medicare Other | Attending: Internal Medicine

## 2016-08-22 ENCOUNTER — Inpatient Hospital Stay (HOSPITAL_BASED_OUTPATIENT_CLINIC_OR_DEPARTMENT_OTHER): Payer: Medicare Other | Admitting: Internal Medicine

## 2016-08-22 ENCOUNTER — Inpatient Hospital Stay (HOSPITAL_BASED_OUTPATIENT_CLINIC_OR_DEPARTMENT_OTHER): Payer: Medicare Other

## 2016-08-22 ENCOUNTER — Other Ambulatory Visit: Payer: Self-pay

## 2016-08-22 VITALS — BP 126/83 | HR 89 | Temp 97.5°F | Resp 18 | Ht 61.0 in | Wt 120.0 lb

## 2016-08-22 DIAGNOSIS — Z923 Personal history of irradiation: Secondary | ICD-10-CM | POA: Insufficient documentation

## 2016-08-22 DIAGNOSIS — J9610 Chronic respiratory failure, unspecified whether with hypoxia or hypercapnia: Secondary | ICD-10-CM

## 2016-08-22 DIAGNOSIS — Z862 Personal history of diseases of the blood and blood-forming organs and certain disorders involving the immune mechanism: Secondary | ICD-10-CM | POA: Diagnosis not present

## 2016-08-22 DIAGNOSIS — Z9981 Dependence on supplemental oxygen: Secondary | ICD-10-CM | POA: Insufficient documentation

## 2016-08-22 DIAGNOSIS — Z8781 Personal history of (healed) traumatic fracture: Secondary | ICD-10-CM

## 2016-08-22 DIAGNOSIS — Z79899 Other long term (current) drug therapy: Secondary | ICD-10-CM | POA: Diagnosis not present

## 2016-08-22 DIAGNOSIS — Z9484 Stem cells transplant status: Secondary | ICD-10-CM | POA: Diagnosis not present

## 2016-08-22 DIAGNOSIS — C8331 Diffuse large B-cell lymphoma, lymph nodes of head, face, and neck: Secondary | ICD-10-CM

## 2016-08-22 DIAGNOSIS — Z8701 Personal history of pneumonia (recurrent): Secondary | ICD-10-CM

## 2016-08-22 DIAGNOSIS — Z9221 Personal history of antineoplastic chemotherapy: Secondary | ICD-10-CM | POA: Diagnosis not present

## 2016-08-22 DIAGNOSIS — Z452 Encounter for adjustment and management of vascular access device: Secondary | ICD-10-CM | POA: Insufficient documentation

## 2016-08-22 DIAGNOSIS — D61818 Other pancytopenia: Secondary | ICD-10-CM

## 2016-08-22 DIAGNOSIS — Z7982 Long term (current) use of aspirin: Secondary | ICD-10-CM

## 2016-08-22 DIAGNOSIS — J45909 Unspecified asthma, uncomplicated: Secondary | ICD-10-CM | POA: Insufficient documentation

## 2016-08-22 DIAGNOSIS — C8590 Non-Hodgkin lymphoma, unspecified, unspecified site: Secondary | ICD-10-CM

## 2016-08-22 LAB — CBC WITH DIFFERENTIAL/PLATELET
BASOS ABS: 0 10*3/uL (ref 0–0.1)
BASOS PCT: 1 %
EOS ABS: 0.2 10*3/uL (ref 0–0.7)
Eosinophils Relative: 6 %
HCT: 34 % — ABNORMAL LOW (ref 35.0–47.0)
HEMOGLOBIN: 11.5 g/dL — AB (ref 12.0–16.0)
Lymphocytes Relative: 28 %
Lymphs Abs: 0.9 10*3/uL — ABNORMAL LOW (ref 1.0–3.6)
MCH: 32.9 pg (ref 26.0–34.0)
MCHC: 33.8 g/dL (ref 32.0–36.0)
MCV: 97.5 fL (ref 80.0–100.0)
Monocytes Absolute: 0.4 10*3/uL (ref 0.2–0.9)
Monocytes Relative: 11 %
NEUTROS PCT: 54 %
Neutro Abs: 1.8 10*3/uL (ref 1.4–6.5)
Platelets: 165 10*3/uL (ref 150–440)
RBC: 3.48 MIL/uL — ABNORMAL LOW (ref 3.80–5.20)
RDW: 17.8 % — ABNORMAL HIGH (ref 11.5–14.5)
WBC: 3.3 10*3/uL — ABNORMAL LOW (ref 3.6–11.0)

## 2016-08-22 LAB — COMPREHENSIVE METABOLIC PANEL
ALBUMIN: 3.5 g/dL (ref 3.5–5.0)
ALK PHOS: 72 U/L (ref 38–126)
ALT: 15 U/L (ref 14–54)
ANION GAP: 4 — AB (ref 5–15)
AST: 15 U/L (ref 15–41)
BUN: 31 mg/dL — ABNORMAL HIGH (ref 6–20)
CALCIUM: 9.2 mg/dL (ref 8.9–10.3)
CHLORIDE: 107 mmol/L (ref 101–111)
CO2: 27 mmol/L (ref 22–32)
CREATININE: 0.8 mg/dL (ref 0.44–1.00)
GFR calc Af Amer: >60 mL/min (ref >60)
GFR calc non Af Amer: >60 mL/min (ref >60)
GLUCOSE: 97 mg/dL (ref 65–99)
Potassium: 3.4 mmol/L — ABNORMAL LOW (ref 3.5–5.1)
SODIUM: 138 mmol/L (ref 135–145)
Total Bilirubin: 0.5 mg/dL (ref 0.3–1.2)
Total Protein: 6.5 g/dL (ref 6.5–8.1)

## 2016-08-22 LAB — LACTATE DEHYDROGENASE: LDH: 144 U/L (ref 98–192)

## 2016-08-22 MED ORDER — SODIUM CHLORIDE 0.9% FLUSH
10.0000 mL | Freq: Once | INTRAVENOUS | Status: AC
  Administered 2016-08-22: 10 mL via INTRAVENOUS
  Filled 2016-08-22: qty 10

## 2016-08-22 MED ORDER — HEPARIN SOD (PORK) LOCK FLUSH 100 UNIT/ML IV SOLN
500.0000 [IU] | Freq: Once | INTRAVENOUS | Status: AC
  Administered 2016-08-22: 500 [IU] via INTRAVENOUS
  Filled 2016-08-22: qty 5
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
## 2016-08-22 NOTE — Assessment & Plan Note (Addendum)
#   DLBCL-status post relapse; status post autologous stem cell transplant.  last treatment in 2015; PET May 2016- NED. Clinically no evidence of recurrence  # Transient/mild pancytopenia- white count 3.3/ stable; ANC 2.0- hemoglobin 11 platelets/165; likely secondary to antibiotics as infection. Currently improved. Clinically I do not suspect any recurrence of lymphoma at this time. If worse, will get PEt scan/Bmbx.   # Chronic respiratory failure on oxygen; multiple lung infections; ? Bronchitis vs pneumonia. Currently improved.  # s/p prevnar 13; needs PPSC 23. Discussed with Pharmacy; will have to be done in 6 weeks from now. This will be ordered.   # follow up in 6 weeks/port flush/Pneumonia vax ppsv 23  # Follow-up with me as planned in appx 3 months/ labs port flush.

## 2016-08-27 ENCOUNTER — Encounter: Payer: Self-pay | Admitting: Internal Medicine

## 2016-08-27 ENCOUNTER — Ambulatory Visit (INDEPENDENT_AMBULATORY_CARE_PROVIDER_SITE_OTHER): Payer: Medicare Other | Admitting: Internal Medicine

## 2016-08-27 VITALS — BP 120/62 | HR 99 | Ht 61.0 in | Wt 124.0 lb

## 2016-08-27 DIAGNOSIS — J9611 Chronic respiratory failure with hypoxia: Secondary | ICD-10-CM
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
## 2016-08-27 NOTE — Patient Instructions (Signed)
Continue Inhalers as prescribed  

## 2016-09-02 ENCOUNTER — Ambulatory Visit: Attending: Internal Medicine | Primary: Internal Medicine

## 2016-09-02 DIAGNOSIS — E119 Type 2 diabetes mellitus without complications: Principal | ICD-10-CM

## 2016-09-02 DIAGNOSIS — Z794 Long term (current) use of insulin: Secondary | ICD-10-CM

## 2016-09-02 DIAGNOSIS — E1165 Type 2 diabetes mellitus with hyperglycemia: Secondary | ICD-10-CM

## 2016-09-02 DIAGNOSIS — Z7189 Other specified counseling: Secondary | ICD-10-CM

## 2016-09-02 DIAGNOSIS — K219 Gastro-esophageal reflux disease without esophagitis: Secondary | ICD-10-CM

## 2016-09-02 DIAGNOSIS — I10 Essential (primary) hypertension: Secondary | ICD-10-CM

## 2016-09-02 DIAGNOSIS — F419 Anxiety disorder, unspecified: Secondary | ICD-10-CM

## 2016-09-02 DIAGNOSIS — K5792 Diverticulitis of intestine, part unspecified, without perforation or abscess without bleeding: Secondary | ICD-10-CM

## 2016-09-02 DIAGNOSIS — E785 Hyperlipidemia, unspecified: Secondary | ICD-10-CM

## 2016-09-02 DIAGNOSIS — E1142 Type 2 diabetes mellitus with diabetic polyneuropathy: Principal | ICD-10-CM

## 2016-09-02 DIAGNOSIS — F329 Major depressive disorder, single episode, unspecified: Secondary | ICD-10-CM

## 2016-09-02 DIAGNOSIS — Z6841 Body Mass Index (BMI) 40.0 and over, adult: Secondary | ICD-10-CM

## 2016-09-02 DIAGNOSIS — M1990 Unspecified osteoarthritis, unspecified site: Secondary | ICD-10-CM

## 2016-09-02 MED ORDER — CIPROFLOXACIN HCL 500 MG PO TABS
500 mg | Freq: Two times a day (BID) | ORAL | 0 refills | Status: CP
Start: 2016-09-02 — End: ?

## 2016-09-02 MED ORDER — INSULIN LISPRO PROT & LISPRO (75-25) 100 UNIT/ML SC SUSP
5 refills | Status: CP
Start: 2016-09-02 — End: 2017-08-01

## 2016-09-02 NOTE — Progress Notes
?   lisinopril (PRINIVIL,ZESTRIL) 10 MG Tablet TAKE ONE TABLET EVERY DAY.[name].THANK YOU..   ? meclizine (ANTIVERT) 25 MG Tablet TAKE ONE OR TWO TABLETS  EVERY EIGHT HOURS .[name].THANKYOU..   ? nystatin (MYCOSTATIN) 100000 UNIT/ML Suspension Take 5 mLs by mouth 4 times daily.   ? oxybutynin (DITROPAN) 5 MG Tablet TAKE TWO TABLETS BY MOUTHTWICE A DAY .[name].THANK YOU..   ? pravastatin (PRAVACHOL) 40 MG Tablet TAKE ONE TABLET AT       BEDTIME .[name].THANK YOU..   ? PROAIR HFA 108 (90 Base) MCG/ACT IN Aerosol Solution INHALE 2 PUFFS EVERY 6 HOURS AS NEEDED.   ? raNITIdine (ZANTAC) 150 MG Tablet TAKE ONE TABLET TWICE A  DAY ##THANK YOU##   ? tiZANidine (ZANAFLEX) 2 MG PO Tablet TAKE TWO TABLETS EVERY   EIGHT HOURS (ONLY WHEN   NEEDED) ##THANK YOU##   ? torsemide (DEMADEX) 20 MG Tablet TAKE ONE TABLET EVERY    MORNING ##THANK YOU##   ? VITAMIN D-2 50000 UNITS Capsule TAKE ONE CAPSULE BY MOUTHTWO TIMES PER WEEK FOR 6 WEEKS .[name].THANK YOU.[name].     No current facility-administered medications on file prior to visit.      Allergies   Allergen Reactions   ? Asa [Aspirin] Itching   ? Pcn [Penicillins] Itching   ? Sulfa Drugs Itching         Review of Systems  Review of Systems   Constitutional: Positive for fatigue. Negative for chills, fever and weight loss.   HENT: Negative for congestion, postnasal drip, rhinorrhea and sinus pressure.    Eyes: Negative.  Negative for blurred vision.   Respiratory: Negative.  Negative for cough and wheezing.    Cardiovascular: Positive for leg swelling. Negative for chest pain and palpitations.        Right leg   Gastrointestinal: Positive for abdominal pain and diarrhea. Negative for anorexia, constipation, hematochezia, melena, nausea and vomiting.   Endocrine: Positive for polydipsia, polyphagia and polyuria.   Genitourinary: Negative for dysuria, frequency, hematuria and urgency.   Musculoskeletal: Positive for arthralgias and myalgias.   Skin: Negative for pallor.   Allergic/Immunologic: Negative.

## 2016-09-02 NOTE — Progress Notes
has been waxing and waning. The pain is located in the LLQ. The pain is at a severity of 3/10. The pain is mild. The quality of the pain is colicky, cramping and aching. The abdominal pain does not radiate. Associated symptoms include arthralgias, diarrhea and myalgias. Pertinent negatives include no anorexia, belching, constipation, dysuria, fever, frequency, headaches, hematochezia, hematuria, melena, nausea, vomiting or weight loss. Nothing aggravates the pain. The pain is relieved by bowel movements and passing flatus. She has tried nothing for the symptoms. The treatment provided no relief. Her past medical history is significant for GERD. There is no history of abdominal surgery, colon cancer, Crohn's disease, gallstones, irritable bowel syndrome, pancreatitis, PUD or ulcerative colitis.     CBC (with or without Differential):   Lab Results   Component Value Date    WBC 6.8 07/30/2016    HGB 11.1 07/30/2016    HCT 35.1 07/30/2016    MCV 82 07/30/2016    MCH 25.9 (L) 07/30/2016    MCHC 31.6 07/30/2016    RDW 15.3 07/30/2016    PLATCOUNT 307 07/30/2016    NEUTROPCT 65 07/30/2016    LYMPHPCT 24 07/30/2016    MONOPCT 5 07/30/2016    EOSPCT 5 07/30/2016    BASOPCT 1 07/30/2016   , BMP/CMP:   Lab Results   Component Value Date    NA 136 07/30/2016    K 5.1 07/30/2016    CL 91 (L) 07/30/2016    CO2 26 07/30/2016    BUN 32 (H) 07/30/2016    CREATININE 1.31 (H) 07/30/2016    GLU 149 (H) 07/30/2016    CALCIUM 9.6 07/30/2016    TPROT 7.0 07/30/2016    ALB 4.0 07/30/2016    AST 13 07/30/2016    ALT 12 07/30/2016    TBILI <0.2 07/30/2016    ALKPHOS 96 07/30/2016   , Lipid Profile    Lab Results   Component Value Date/Time    CHOL 173 07/30/2016 01:16 PM    CHOL 161 07/04/2014    TRIG 399 (H) 07/30/2016 01:16 PM    HDL 34 (L) 07/30/2016 01:16 PM    LDL 59 07/30/2016 01:16 PM    and HGBA1C:    Lab Results   Component Value Date/Time    HGBA1C 9.0 (H) 07/30/2016 01:16 PM       Past Medical History:   Diagnosis Date

## 2016-09-02 NOTE — Progress Notes
Neurological: Negative.  Negative for dizziness, tremors, seizures, speech difficulty, weakness and headaches.   Hematological: Negative.    Psychiatric/Behavioral: Positive for decreased concentration. Negative for confusion. The patient is not nervous/anxious.          Objective:        VITAL SIGNS (all recorded)      Clinic Vitals       09/02/16 1328             Amb Encounter Vitals    Weight 117.2 kg (258 lb 6.4 oz)    -TD at 09/02/16 1334       Height 1.626 m (5\' 4" )    -TD at 09/02/16 1334       BMI (Calculated) 44.45    -TD at 09/02/16 1334       BSA (Calculated - sq m) 2.3    -TD at 09/02/16 1334       BP 114/64    -TD at 09/02/16 1334       BP Location Left upper arm    -TD at 09/02/16 1334       Position Sitting    -TD at 09/02/16 1334       Pulse 78    -TD at 09/02/16 1334       Pulse Source Radial    -TD at 09/02/16 1334       Resp 16    -TD at 09/02/16 1334       Temp 36.4 ?C (97.6 ?F)    -TD at 09/02/16 1334       Temperature Source Oral    -TD at 09/02/16 1334       O2 Saturation 98 %    -TD at 09/02/16 1334       FiO2 Source RA    -TD at 09/02/16 1334       Pain Score Zero    -TD at 09/02/16 1334       Education/Communication Barriers?    Learning/Communication Barriers? No    -TD at 09/02/16 1334       Fall Risk Assessment    Had recent fall / Last 6 months? No recent fall    -TD at 09/02/16 1334       Does patient have a fear of falling? No    -TD at 09/02/16 1334         User Key  (r) = Recorded By, (t) = Taken By, (c) = Cosigned By    Initials Name Effective Dates    TD Duffy, Tammy, MA 10/24/15 -         Physical Exam   Constitutional: She is oriented to person, place, and time. She appears well-developed and well-nourished.   HENT:   Head: Normocephalic and atraumatic.   Right Ear: External ear normal.   Left Ear: External ear normal.   Nose: Nose normal.   Mouth/Throat: Oropharynx is clear and moist.   Eyes: Conjunctivae and EOM are normal. Pupils are equal, round, and reactive to light.

## 2016-09-02 NOTE — Progress Notes
?   Anxiety    ? Arthritis    ? Depression    ? Diabetes mellitus    ? GERD (gastroesophageal reflux disease)    ? Hyperlipidemia    ? Hypertension      Past Surgical History:   Procedure Laterality Date   ? HYSTERECTOMY       Family History   Problem Relation Age of Onset   ? No Known Problems Mother    ? No Known Problems Father      Social History     Social History   ? Marital status: Single     Spouse name: N/A   ? Number of children: N/A   ? Years of education: N/A     Occupational History   ? Not on file.     Social History Main Topics   ? Smoking status: Never Smoker   ? Smokeless tobacco: Never Used   ? Alcohol use No   ? Drug use: No   ? Sexual activity: No     Other Topics Concern   ? Not on file     Social History Narrative     Current Outpatient Prescriptions on File Prior to Visit   Medication Sig   ? ALPRAZolam (XANAX) 0.5 MG tablet Take 1 Tablet by mouth 2 times daily.   ? diclofenac (VOLTAREN GEL) 1 % Gel topical gel APPLY 4 GM TOPICALLY FOURTIMES A DAY **MEASURE    DOSE USING ENCLOSED DOSE CARD*   ? donepezil (ARICEPT) 5 MG Tablet TAKE ONE TABLET BY MOUTH AT BEDTIME .[name].THANK YOU..   ? esomeprazole (NexIUM) 40 MG Capsule Delayed Release TAKE ONE CAPSULE EVERY   DAY .[name].THANK YOU..   ? fluticasone (FLONASE) 50 MCG/ACT Suspension SPRAY ONE PUFF INTO NOSE TWICE A DAY ==SHAKE      GENTLY==   ? fluticasone-salmeterol (ADVAIR DISKUS) 250-50 MCG/DOSE Aerosol Powder Breath Activated Inhale 1 puff 2 times daily. Rinse mouth well and spit after each use.   ? gabapentin (NEURONTIN) 400 MG Capsule TAKE 2 CAPSULES BY MOUTH 3 TIMES DAILY.   ? GLOBAL INJECT EASE INSULIN SYR 31G X 5/16" 1 ML Miscellaneous USE ONE TWICE A DAY      .[name].THANK YOU..   ? hydroCHLOROthiazide (HYDRODIURIL) 25 MG Tablet TAKE ONE TABLET EVERY DAYFOR FLUIDS .[name].THANK YOU..   ? [DISCONTINUED] insulin lispro protamine-lispro (HumaLOG 75/25) vial 55 units qam 30 min before breakfast and 38 unit qpm 30 min before supper

## 2016-09-02 NOTE — Progress Notes
Subjective:   Destiny RedwoodMary Pittman is a 71 y.o. female being seen today for Follow-up (63mo.)      Diabetes   She presents for her follow-up diabetic visit. She has type 2 diabetes mellitus. No MedicAlert identification noted. Her disease course has been worsening. Hypoglycemia symptoms include hunger. Pertinent negatives for hypoglycemia include no confusion, dizziness, headaches, mood changes, nervousness/anxiousness, pallor, seizures, sleepiness, speech difficulty, sweats or tremors. Associated symptoms include fatigue, foot paresthesias, polydipsia, polyphagia and polyuria. Pertinent negatives for diabetes include no blurred vision, no chest pain, no foot ulcerations, no visual change, no weakness and no weight loss. There are no hypoglycemic complications. Pertinent negatives for hypoglycemia complications include no blackouts, no hospitalization, no nocturnal hypoglycemia, no required assistance and no required glucagon injection. Symptoms are stable. Diabetic complications include autonomic neuropathy, peripheral neuropathy and PVD. Risk factors for coronary artery disease include diabetes mellitus, dyslipidemia, hypertension, obesity, post-menopausal and sedentary lifestyle. Current diabetic treatment includes diet and insulin injections. She is compliant with treatment most of the time. Her weight is fluctuating minimally. She is following a high fat/cholesterol and generally unhealthy diet. When asked about meal planning, she reported none. She has not had a previous visit with a dietitian. She never participates in exercise. She monitors blood glucose at home 3-4 x per day. Blood glucose monitoring compliance is fair. Her home blood glucose trend is fluctuating minimally. She sees a podiatrist.Eye exam is current.   Abdominal Pain   This is a new problem. The current episode started in the past 7 days. The onset quality is gradual. The problem occurs intermittently. The problem

## 2016-09-02 NOTE — Progress Notes
Neck: Normal range of motion. Neck supple.   Cardiovascular: Normal rate, regular rhythm, normal heart sounds and intact distal pulses.    Pulmonary/Chest: Effort normal and breath sounds normal.   Abdominal: Soft. Bowel sounds are normal. There is tenderness in the left lower quadrant.   Musculoskeletal: Normal range of motion. She exhibits edema and tenderness.   Allover joints, 1+ edema right leg   Neurological: She is alert and oriented to person, place, and time. She has normal reflexes.   Skin: Skin is warm and dry.   Psychiatric: She has a normal mood and affect. Her behavior is normal. Judgment and thought content normal. Cognition and memory are impaired. She exhibits abnormal recent memory.   Nursing note and vitals reviewed.        Assessment:       ICD-10-CM ICD-9-CM    1. Uncontrolled type 2 diabetes mellitus with diabetic polyneuropathy, with long-term current use of insulin E11.42 250.62 insulin lispro protamine-lispro (HumaLOG 75/25) vial    Z79.4 357.2     E11.65 V58.67    2. Acute diverticulitis K57.92 562.11 ciprofloxacin (CIPRO) 500 MG PO Tablet   3. Advanced directives, counseling/discussion Z71.89 V65.49    4. BMI 40.0-44.9, adult Z68.41 V85.41           Plan:   increase insulin to 60 am and 40 pm  Monitor BS  CIPRO BID  Increase fluid intake  Continue current meds.  Diet and exercise as long patient can tolerate.  Salt restrictions.  FU with other MDs as scheduled.  Meds benefits and side effects discussed with patient. Patient sounds understanding.  Orders Placed This Encounter   Medications   ? insulin lispro protamine-lispro (HumaLOG 75/25) vial     Sig: 60 units qam 30 min before breakfast and 40 unit qpm 30 min before supper     Dispense:  60 vial     Refill:  5   ? ciprofloxacin (CIPRO) 500 MG PO Tablet     Sig: Take 1 tablet by mouth 2 times daily for 7 days.     Dispense:  14 tablet     Refill:  0     No orders of the following type(s) were placed in this encounter: Procedures

## 2016-09-02 NOTE — Progress Notes
Teaching re:  DM: untreated elevations of blood sugar can lead to problems with eyes, nerves, kidneys, heart, blood vessels, and integrity of feet.  Discussed pathophysiology of pancreas, insulin, insulin resistance, effect of obesity and smoking, concept of syndrome X  Importance of monitoring blood sugars and recording results is discussed at length.  Patient has materials for keeping records and for measuring.  Educational materials regarding diabetes is given to patient.   Patient is made aware of the importance of monitoring HgbA1c every three to six months, having a yearly dilated eye exam, checking feet regularly for damage to the skin, monitoring cholesterol, seeing an Opthalmologist yearly and maintaining a diet consistent with diabetes care.  Patient given educational material regarding diabetes.  Health Maintenance was reviewed. The patient's HM Topic list was:                                            Health Maintenance   Topic Date Due   ? Preventive Wellness Visit  09/30/2016 (Originally 11/16/1963)   ? Zoster Vaccine (1) 09/30/2016 (Originally 11/17/2003)   ? DTaP,Tdap,and Td Vaccines (1 - Tdap) 11/30/2016 (Originally [birth year]17/1964)   ? Pneumovax / Prevnar (1 of 2 - PCV13) 11/30/2016 (Originally 11/16/2008)   ? Diabetic Eye Exam  11/30/2016 (Originally 08/17/2016)   ? Diabetic Foot Exam  10/23/2016   ? Hemoglobin A1C  01/27/2017   ? Lipid Profile  07/30/2017   ? Urine Microalbumin  07/30/2017   ? Creatinine  07/30/2017   ? Basic Metabolic Panel  07/30/2017   ? Breast Cancer Screening  10/16/2017   ? Dexa  04/18/2025   ? Colon Cancer Screening  06/04/2026   ? USPSTF Hepatitis C Screening  Completed   ? Influenza Vaccine  Addressed

## 2016-09-03 ENCOUNTER — Telehealth: Payer: Self-pay | Admitting: Internal Medicine

## 2016-09-03 MED ORDER — LEVOFLOXACIN 500 MG PO TABS: 500.0000 mg | ORAL_TABLET | Freq: Every day | ORAL | 0 refills | Status: DC

## 2016-09-03 NOTE — Telephone Encounter (Signed)
Pt spouse states pt is running a fever, cold symptoms, coughing, stopped up nose. Pt spouse requests a rx be called in or an appt.

## 2016-09-03 NOTE — Telephone Encounter (Signed)
Please advise on message below. DK pt and no provider in clinic tomorrow.

## 2016-09-03 NOTE — Telephone Encounter (Signed)
Pls send in script for levaquin, 500 mg daily for 5 days.

## 2016-09-03 NOTE — Telephone Encounter (Signed)
Informed husband Levaquin has been sent to the pharmacy. Nothing further needed.

## 2016-09-05 ENCOUNTER — Ambulatory Visit: Payer: Medicare Other

## 2016-09-07 ENCOUNTER — Telehealth: Payer: Self-pay | Admitting: Emergency Medicine

## 2016-09-07 MED ORDER — PREDNISONE 10 MG PO TABS: ORAL_TABLET | ORAL | 0 refills | Status: DC

## 2016-09-07 NOTE — Telephone Encounter (Signed)
Called by husband. Pt started on levaquin on 2/12 for bronchitis, fever.  The fever has resolved now.  She is having low energy, poor PO intake. No dyspnea but does have clear congestion, some wheeze.   Pt's husband calling to request prednisone taper, seem reasonable given her wheeze and dyspnea.   Will call in pred taper. Pt knows to call or seek care if worsening

## 2016-09-10 ENCOUNTER — Telehealth: Payer: Self-pay | Admitting: Internal Medicine

## 2016-09-10 NOTE — Telephone Encounter (Signed)
Spoke with husband and scheduled an appt with DR for Wednesday. Nothing further needed.

## 2016-09-10 NOTE — Telephone Encounter (Signed)
Pt spouse calling stating pt still has that "rattling cough"  Pt is very weak and they are worried. usually the medication helps a lot faster than this time around Would like some advise on this  please call

## 2016-09-10 NOTE — Telephone Encounter (Signed)
DR sent Levaquin 500mg  x 5 days on 09/03/16. Now husband is calling saying pt still has rattling cough and very weak. Please advise. DK pt.
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
## 2016-09-10 NOTE — Telephone Encounter (Signed)
You get one attempt at doing over the phone. An antibiotic "failure" requires an eval by an MD. See if you can get her in tomorrow at the end of the day with DR or suggest urgent care eval  Waunita Schooner

## 2016-09-11 ENCOUNTER — Other Ambulatory Visit: Payer: Self-pay

## 2016-09-11 ENCOUNTER — Other Ambulatory Visit: Payer: Self-pay | Admitting: Internal Medicine

## 2016-09-11 ENCOUNTER — Ambulatory Visit
Admission: RE | Admit: 2016-09-11 | Discharge: 2016-09-11 | Disposition: A | Discharge status: Home / Self Care | Payer: Medicare Other | Source: Ambulatory Visit | Attending: Internal Medicine | Admitting: Internal Medicine

## 2016-09-11 ENCOUNTER — Inpatient Hospital Stay
Admission: EM | Admit: 2016-09-11 | Discharge: 2016-09-13 | DRG: 871 | Disposition: A | Discharge status: Home Health | Payer: Medicare Other | Attending: Internal Medicine | Admitting: Internal Medicine

## 2016-09-11 ENCOUNTER — Emergency Department: Payer: Medicare Other

## 2016-09-11 ENCOUNTER — Ambulatory Visit (INDEPENDENT_AMBULATORY_CARE_PROVIDER_SITE_OTHER): Payer: Medicare Other | Admitting: Internal Medicine

## 2016-09-11 ENCOUNTER — Encounter: Payer: Self-pay | Admitting: Emergency Medicine

## 2016-09-11 ENCOUNTER — Encounter: Payer: Self-pay | Admitting: Internal Medicine

## 2016-09-11 VITALS — BP 118/68 | HR 104 | Wt 117.0 lb

## 2016-09-11 DIAGNOSIS — E039 Hypothyroidism, unspecified: Secondary | ICD-10-CM | POA: Diagnosis present

## 2016-09-11 DIAGNOSIS — J209 Acute bronchitis, unspecified: Secondary | ICD-10-CM | POA: Diagnosis present

## 2016-09-11 DIAGNOSIS — J189 Pneumonia, unspecified organism: Secondary | ICD-10-CM | POA: Diagnosis present

## 2016-09-11 DIAGNOSIS — D630 Anemia in neoplastic disease: Secondary | ICD-10-CM | POA: Diagnosis present

## 2016-09-11 DIAGNOSIS — D899 Disorder involving the immune mechanism, unspecified: Secondary | ICD-10-CM | POA: Diagnosis present

## 2016-09-11 DIAGNOSIS — Z7982 Long term (current) use of aspirin: Secondary | ICD-10-CM | POA: Diagnosis not present

## 2016-09-11 DIAGNOSIS — Z66 Do not resuscitate: Secondary | ICD-10-CM | POA: Diagnosis present

## 2016-09-11 DIAGNOSIS — R2681 Unsteadiness on feet: Secondary | ICD-10-CM | POA: Diagnosis not present

## 2016-09-11 DIAGNOSIS — Z833 Family history of diabetes mellitus: Secondary | ICD-10-CM

## 2016-09-11 DIAGNOSIS — Z6824 Body mass index (BMI) 24.0-24.9, adult: Secondary | ICD-10-CM

## 2016-09-11 DIAGNOSIS — J9611 Chronic respiratory failure with hypoxia: Secondary | ICD-10-CM

## 2016-09-11 DIAGNOSIS — Z888 Allergy status to other drugs, medicaments and biological substances status: Secondary | ICD-10-CM

## 2016-09-11 DIAGNOSIS — Z8249 Family history of ischemic heart disease and other diseases of the circulatory system: Secondary | ICD-10-CM

## 2016-09-11 DIAGNOSIS — E876 Hypokalemia: Secondary | ICD-10-CM | POA: Diagnosis present

## 2016-09-11 DIAGNOSIS — Y842 Radiological procedure and radiotherapy as the cause of abnormal reaction of the patient, or of later complication, without mention of misadventure at the time of the procedure: Secondary | ICD-10-CM | POA: Diagnosis present

## 2016-09-11 DIAGNOSIS — N179 Acute kidney failure, unspecified: Secondary | ICD-10-CM | POA: Diagnosis not present

## 2016-09-11 DIAGNOSIS — Z7951 Long term (current) use of inhaled steroids: Secondary | ICD-10-CM

## 2016-09-11 DIAGNOSIS — F329 Major depressive disorder, single episode, unspecified: Secondary | ICD-10-CM | POA: Diagnosis not present

## 2016-09-11 DIAGNOSIS — R0600 Dyspnea, unspecified: Secondary | ICD-10-CM

## 2016-09-11 DIAGNOSIS — R634 Abnormal weight loss: Secondary | ICD-10-CM | POA: Diagnosis not present

## 2016-09-11 DIAGNOSIS — J45909 Unspecified asthma, uncomplicated: Secondary | ICD-10-CM | POA: Diagnosis not present

## 2016-09-11 DIAGNOSIS — A419 Sepsis, unspecified organism: Principal | ICD-10-CM | POA: Diagnosis present

## 2016-09-11 DIAGNOSIS — R0602 Shortness of breath: Secondary | ICD-10-CM | POA: Diagnosis present

## 2016-09-11 DIAGNOSIS — Z8572 Personal history of non-Hodgkin lymphomas: Secondary | ICD-10-CM | POA: Insufficient documentation

## 2016-09-11 DIAGNOSIS — C8590 Non-Hodgkin lymphoma, unspecified, unspecified site: Secondary | ICD-10-CM | POA: Diagnosis present

## 2016-09-11 DIAGNOSIS — Z923 Personal history of irradiation: Secondary | ICD-10-CM

## 2016-09-11 DIAGNOSIS — J9620 Acute and chronic respiratory failure, unspecified whether with hypoxia or hypercapnia: Secondary | ICD-10-CM | POA: Diagnosis not present

## 2016-09-11 DIAGNOSIS — E860 Dehydration: Secondary | ICD-10-CM | POA: Diagnosis not present

## 2016-09-11 DIAGNOSIS — J181 Lobar pneumonia, unspecified organism: Principal | ICD-10-CM

## 2016-09-11 DIAGNOSIS — Z79899 Other long term (current) drug therapy: Secondary | ICD-10-CM

## 2016-09-11 DIAGNOSIS — I959 Hypotension, unspecified: Secondary | ICD-10-CM | POA: Diagnosis present

## 2016-09-11 DIAGNOSIS — I700 Atherosclerosis of aorta: Secondary | ICD-10-CM | POA: Insufficient documentation

## 2016-09-11 DIAGNOSIS — J449 Chronic obstructive pulmonary disease, unspecified: Secondary | ICD-10-CM | POA: Insufficient documentation

## 2016-09-11 DIAGNOSIS — R6520 Severe sepsis without septic shock: Secondary | ICD-10-CM

## 2016-09-11 LAB — CBC WITH DIFFERENTIAL/PLATELET
BASOS ABS: 0 10*3/uL (ref 0–0.1)
Basophils Relative: 0 %
EOS ABS: 0.3 10*3/uL (ref 0–0.7)
Eosinophils Relative: 4 %
HCT: 40.3 % (ref 35.0–47.0)
Hemoglobin: 13.5 g/dL (ref 12.0–16.0)
LYMPHS ABS: 0.9 10*3/uL — AB (ref 1.0–3.6)
LYMPHS PCT: 11 %
MCH: 32.7 pg (ref 26.0–34.0)
MCHC: 33.5 g/dL (ref 32.0–36.0)
MCV: 97.5 fL (ref 80.0–100.0)
MONO ABS: 1 10*3/uL — AB (ref 0.2–0.9)
Monocytes Relative: 13 %
Neutro Abs: 5.5 10*3/uL (ref 1.4–6.5)
Neutrophils Relative %: 72 %
PLATELETS: 153 10*3/uL (ref 150–440)
RBC: 4.14 MIL/uL (ref 3.80–5.20)
RDW: 17 % — ABNORMAL HIGH (ref 11.5–14.5)
WBC: 7.7 10*3/uL (ref 3.6–11.0)

## 2016-09-11 LAB — COMPREHENSIVE METABOLIC PANEL
ALK PHOS: 67 U/L (ref 38–126)
ALT: 22 U/L (ref 14–54)
ANION GAP: 8 (ref 5–15)
AST: 20 U/L (ref 15–41)
Albumin: 3.3 g/dL — ABNORMAL LOW (ref 3.5–5.0)
BILIRUBIN TOTAL: 0.8 mg/dL (ref 0.3–1.2)
BUN: 27 mg/dL — ABNORMAL HIGH (ref 6–20)
CALCIUM: 8.8 mg/dL — AB (ref 8.9–10.3)
CO2: 27 mmol/L (ref 22–32)
CREATININE: 1.1 mg/dL — AB (ref 0.44–1.00)
Chloride: 103 mmol/L (ref 101–111)
GFR calc non Af Amer: 49 mL/min — ABNORMAL LOW (ref >60)
GFR, EST AFRICAN AMERICAN: 57 mL/min — AB (ref >60)
Glucose, Bld: 106 mg/dL — ABNORMAL HIGH (ref 65–99)
Potassium: 3.2 mmol/L — ABNORMAL LOW (ref 3.5–5.1)
Sodium: 138 mmol/L (ref 135–145)
TOTAL PROTEIN: 6.8 g/dL (ref 6.5–8.1)

## 2016-09-11 LAB — PROTIME-INR
INR: 1.02
Prothrombin Time: 13.4 seconds (ref 11.4–15.2)

## 2016-09-11 LAB — PROCALCITONIN

## 2016-09-11 LAB — LIPASE, BLOOD: Lipase: 22 U/L (ref 11–51)

## 2016-09-11 LAB — LACTIC ACID, PLASMA: Lactic Acid, Venous: 1.1 mmol/L (ref 0.5–1.9)

## 2016-09-11 LAB — T4, FREE: Free T4: 1.04 ng/dL (ref 0.61–1.12)

## 2016-09-11 LAB — TSH: TSH: 4.515 u[IU]/mL — AB (ref 0.350–4.500)

## 2016-09-11 LAB — TROPONIN I

## 2016-09-11 MED ORDER — CEFEPIME-DEXTROSE 1 GM/50ML IV SOLR
1.0000 g | Freq: Once | INTRAVENOUS | Status: AC
  Administered 2016-09-11: 1 g via INTRAVENOUS
  Filled 2016-09-11: qty 50

## 2016-09-11 MED ORDER — VANCOMYCIN HCL IN DEXTROSE 750-5 MG/150ML-% IV SOLN
750.0000 mg | INTRAVENOUS | Status: DC
  Filled 2016-09-11: qty 150

## 2016-09-11 MED ORDER — VANCOMYCIN HCL IN DEXTROSE 1-5 GM/200ML-% IV SOLN
INTRAVENOUS | Status: AC
  Filled 2016-09-11: qty 200

## 2016-09-11 MED ORDER — SODIUM CHLORIDE 0.9 % IV BOLUS (SEPSIS)
250.0000 mL | Freq: Once | INTRAVENOUS | Status: AC
  Administered 2016-09-12: 250 mL via INTRAVENOUS

## 2016-09-11 MED ORDER — SODIUM CHLORIDE 0.9 % IV BOLUS (SEPSIS)
1000.0000 mL | Freq: Once | INTRAVENOUS | Status: AC
  Administered 2016-09-11: 1000 mL via INTRAVENOUS

## 2016-09-11 MED ORDER — VANCOMYCIN HCL IN DEXTROSE 1-5 GM/200ML-% IV SOLN
1000.0000 mg | Freq: Once | INTRAVENOUS | Status: AC
  Administered 2016-09-11: 1000 mg via INTRAVENOUS

## 2016-09-11 MED ORDER — SODIUM CHLORIDE 0.9 % IV BOLUS (SEPSIS)
500.0000 mL | Freq: Once | INTRAVENOUS | Status: AC
  Administered 2016-09-12: 500 mL via INTRAVENOUS

## 2016-09-11 NOTE — Addendum Note (Signed)
Addended by: Oscar La R on: 09/11/2016 10:34 AM   Modules accepted: Orders

## 2016-09-11 NOTE — ED Notes (Signed)
Pt has sob.  Pt reported finished axb yesterday and steroids yesterday.  No chest pain.  Hx lymphoma.  Today, pt saw her doctor and tonight is not feeling any better.

## 2016-09-11 NOTE — Progress Notes (Signed)
Pharmacy Antibiotic Note  CLOE GLADIEUX is a 71 y.o. female admitted on 09/11/2016 with pneumonia.  Pharmacy has been consulted for vancomycin dosing.  Plan: DW 53.1kg  Vd 37L kei 0.033 hr-1  T1/2 21 hours 750 mg q 24 hours ordered with stacked dosing. Level before 5th dose. Goal trough 15-20.  Height: 5\' 1"  (154.9 cm) Weight: 117 lb (53.1 kg) IBW/kg (Calculated) : 47.8  Temp (24hrs), Avg:99.1 F (37.3 C), Min:99.1 F (37.3 C), Max:99.1 F (37.3 C)   Recent Labs Lab 09/11/16 2232  WBC 7.7  CREATININE 1.10*  LATICACIDVEN 1.1    Estimated Creatinine Clearance: 34.9 mL/min (by C-G formula based on SCr of 1.1 mg/dL (H)).    Allergies  Allergen Reactions  . Hydrocodone-Homatropine Itching  . Tussionex Pennkinetic Er [Hydrocod Polst-Cpm Polst Er] Itching  . Ciprofloxacin Rash    Antimicrobials this admission: vancomycin 2/21 >>    >>   Dose adjustments this admission:   Microbiology results: 2/21 BCx: pending 2/21 UCx: pending     2/21 UA: pending Thank you for allowing pharmacy to be a part of this patient's care.  Tonita Bills S 09/11/2016 11:52 PM

## 2016-09-11 NOTE — Patient Instructions (Addendum)
--  Check CXR 2 view.   --bronchoscopy to r/o infection or resistant organism.   --home PT.

## 2016-09-11 NOTE — ED Notes (Signed)
CODE SEPSIS CALLED TO LAURA AT [facility]

## 2016-09-11 NOTE — ED Provider Notes (Signed)
[facility] Emergency Department Provider Note   First MD Initiated Contact with Patient 09/11/16 2310     (approximate)  I have reviewed the triage vital signs and the nursing notes.   HISTORY  Chief Complaint Altered Mental Status; Cough; Shortness of Breath; and Weakness   HPI Anna Mcbride is a 71 y.o. female with Anna Mcbride of chronic medical conditions presents to the emergency department with generalized weakness worsening cough/congestion. Patient completed a course of Levaquin and prednisone secondary to pneumonia. She admits to ongoing fever and rapid heart rate. Patient presents to the emergency department a temperature 99.1 heart rate of 107 blood pressure 81/45 with respiratory rate of 22  Past Medical History:  Diagnosis Date  . Acute respiratory failure ([place]) 11/29/2014  . 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 Asthma   . Non Hodgkin's lymphoma [facility])     Patient Active Problem List   Diagnosis Date Noted  . Pneumothorax   . 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 Recurrent upper respiratory infection (URI) 09/01/2015  . Acute respiratory failure with hypoxia ([place])   . 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 SOB (shortness of breath)   . Pneumonia   . Acute respiratory failure ([place]) 11/29/2014  . History of digestive disease 11/29/2014  . HLD (hyperlipidemia) 11/29/2014  . Adult hypothyroidism 11/29/2014  . Lymphoma ([place]) 11/29/2014  . RAD (reactive airway disease) 11/29/2014  . 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 Pain in shoulder 11/01/2014  . Fracture of humerus, proximal 11/01/2014  . Febrile neutropenia ([place]) 11/08/2013  . Decreased potassium in the blood 09/02/2013  . Acquired hypothyroidism 07/01/2013  . Lymphoma, non-Hodgkin's ([place]) 06/30/2013  . Cervical lymphadenopathy 04/20/2013  . History of lymphoma 04/20/2013  . Dyspnea 01/29/2011  . Cough 01/29/2011    Past Surgical  History:  Procedure Laterality Date  . 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 NASAL SINUS SURGERY  08/07/05 09/09/05   x 2   . VIDEO BRONCHOSCOPY Bilateral 11/30/2014   Procedure: VIDEO BRONCHOSCOPY WITHOUT FLUORO;  Surgeon: Flora Lipps, MD;  Location: [facility] ORS;  Service: Cardiopulmonary;  Laterality: Bilateral;    Prior to Admission medications   Medication Sig Start Date End Date Taking? Authorizing Provider  albuterol (PROVENTIL HFA;VENTOLIN HFA) 108 (90 BASE) MCG/ACT inhaler Inhale 1 puff into the lungs every 6 (six) hours as needed for wheezing or shortness of breath.    Historical Provider, MD  albuterol (PROVENTIL) (2.5 MG/3ML) 0.083% nebulizer solution Take 3 mLs (2.5 mg total) by nebulization every 4 (four) hours as needed for wheezing or shortness of breath. 05/21/16   Flora Lipps, MD  AMBULATORY NON FORMULARY MEDICATION Medication Name: Incentive Spirometer Use 10-15 times daily 07/29/16   Flora Lipps, MD  aspirin EC 81 MG tablet Take 81 mg by mouth daily.    Historical Provider, MD  citalopram (CELEXA) 20 MG tablet TAKE 1 TABLET BY MOUTH ONCE DAILY 06/07/16   Cammie Sickle, MD  dextromethorphan-guaiFENesin [facility] DM) 30-600 MG 12hr tablet Take 1 tablet by mouth daily.    Historical Provider, MD  fluticasone-salmeterol (ADVAIR HFA) 115-21 MCG/ACT inhaler Inhale 2 puffs into the lungs 2 (two) times daily. 08/28/15   Forest Gleason, MD  levothyroxine (SYNTHROID, LEVOTHROID) 25 MCG tablet TAKE 1 TABLET BY MOUTH ONCE DAILY ON AN EMPTY STOMACH. WAIT 30 MINUTES BEFORE TAKING OTHER MEDS. 08/19/16   Cammie Sickle, MD  omeprazole (PRILOSEC) 20 MG capsule Take  1 capsule (20 mg total) by mouth 2 (two) times daily before a meal. Patient taking differently: Take 20 mg by mouth daily.  03/05/16   Cammie Sickle, MD  ondansetron (ZOFRAN) 4 MG tablet TAKE 1 TABLET BY MOUTH EVERY 8 HOURS AS NEEDED NAUSEA AND VOMITING 07/03/16   Cammie Sickle, MD  potassium chloride SA (K-DUR,KLOR-CON)  20 MEQ tablet TAKE 1 TABLET BY MOUTH TWICE DAILY 06/20/16   Cammie Sickle, MD  Respiratory Therapy Supplies (FLUTTER) DEVI Use 10-15 times daily 07/29/16   Flora Lipps, MD  umeclidinium bromide (INCRUSE ELLIPTA) 62.5 MCG/INH AEPB Inhale 1 puff into the lungs daily. 07/16/16   Flora Lipps, MD  vitamin C (ASCORBIC ACID) 500 MG tablet Take 500 mg by mouth daily.    Historical Provider, MD    Allergies Hydrocodone-homatropine; Tussionex pennkinetic er [employer] polst-cpm polst er]; and Ciprofloxacin  Family History  Problem Relation Age of Onset  . Asthma Son   . Congestive Heart Failure Mother   . Diabetes Brother     Social History Social History  Substance Use Topics  . Smoking status: Never Smoker  . Smokeless tobacco: Never Used  . Alcohol use Yes    Review of Systems Constitutional: Positive for fever/chills Eyes: No visual changes. ENT: No sore throat. Cardiovascular: Denies chest pain. Positive for rapid heart rate Respiratory: Positive for cough and dyspnea Gastrointestinal: No abdominal pain.  No nausea, no vomiting.  No diarrhea.  No constipation. Genitourinary: Negative for dysuria. Musculoskeletal: Negative for back pain. Skin: Negative for rash. Neurological: Negative for headaches, focal weakness or numbness. Positive for generalized weakness  10-point ROS otherwise negative.  ____________________________________________   PHYSICAL EXAM:  VITAL SIGNS: ED Triage Vitals  Enc Vitals Group     BP 09/11/16 2207 (!) 81/45     Pulse Rate 09/11/16 2207 (!) 107     Resp 09/11/16 2207 (!) 22     Temp 09/11/16 2207 99.1 F (37.3 C)     Temp Source 09/11/16 2207 Oral     SpO2 09/11/16 2207 96 %     Weight 09/11/16 2208 117 lb (53.1 kg)     Height 09/11/16 2208 5\' 1"  (1.549 m)     Head Circumference --      Peak Flow --      Pain Score 09/11/16 2207 0     Pain Loc --      Pain Edu? --      Excl. in [place]? --     Constitutional: Alert and oriented. Well  appearing and in no acute distress. Eyes: Conjunctivae are normal. PERRL. EOMI. Head: Atraumatic. Mouth/Throat: Mucous membranes are moist.  Oropharynx non-erythematous. Neck: No stridor.  No meningeal signs.  No cervical spine tenderness to palpation. Cardiovascular: Tachycardia, regular rhythm. Good peripheral circulation. Grossly normal heart sounds. Respiratory: Normal respiratory effort.  No retractions. Diffuse rhonchi Gastrointestinal: Soft and nontender. No distention.  Musculoskeletal: No lower extremity tenderness nor edema. No gross deformities of extremities. Neurologic:  Normal speech and language. No gross focal neurologic deficits are appreciated.  Skin:  Skin is warm, dry and intact. No rash noted. Psychiatric: Mood and affect are normal. Speech and behavior are normal.  ____________________________________________   LABS (all labs ordered are listed, but only abnormal results are displayed)  Labs Reviewed  COMPREHENSIVE METABOLIC PANEL - Abnormal; Notable for the following:       Result Value   Potassium 3.2 (*)    Glucose, Bld 106 (*)  BUN 27 (*)    Creatinine, Ser 1.10 (*)    Calcium 8.8 (*)    Albumin 3.3 (*)    GFR calc non Af Amer 49 (*)    GFR calc Af Amer 57 (*)    All other components within normal limits  CBC WITH DIFFERENTIAL/PLATELET - Abnormal; Notable for the following:    RDW 17.0 (*)    Lymphs Abs 0.9 (*)    Monocytes Absolute 1.0 (*)    All other components within normal limits  CULTURE, BLOOD (ROUTINE X 2)  CULTURE, BLOOD (ROUTINE X 2)  URINE CULTURE  LACTIC ACID, PLASMA  LIPASE, BLOOD  TROPONIN I  PROTIME-INR  LACTIC ACID, PLASMA  PROCALCITONIN  URINALYSIS, ROUTINE W REFLEX MICROSCOPIC  TSH  T4, FREE  INFLUENZA PANEL BY PCR (TYPE A & B)   ____________________________________________  EKG  ED ECG REPORT I, [place] Elbie Statzer, the attending physician, personally viewed and interpreted this ECG.   Date: 09/12/2016  EKG Time:  10:28 PM  Rate: 97  Rhythm: Normal sinus rhythm  Axis: Normal  Intervals: Normal  ST&T Change:   ____________________________________________  RADIOLOGY I, [place] Ernst Bowler, personally viewed and evaluated these images (plain radiographs) as part of my medical decision making, as well as reviewing the written report by the radiologist.  Dg Chest 2 View  Result Date: 09/11/2016 CLINICAL DATA:  Two weeks of chest congestion and shortness of breath. History of asthma -COPD and recurrent upper respiratory infections. Previous history of pneumothorax. History of non-Hodgkin's lymphoma. EXAM: CHEST  2 VIEW COMPARISON:  CT scan of the chest of June 28, 2016 and chest x-ray of June 20, 2016. FINDINGS: There are stable extensive chronic changes in the right lung with volume loss and parenchymal scarring and pleural thickening. There is no recurrent pneumothorax. There is stable shift of the mediastinum toward the right. The left lung is hyperinflated. The interstitial markings are chronically increased. There is soft tissue fullness in the left hilar region which is slightly more conspicuous today. The observed bony thorax exhibits no acute abnormality. The Port-A-Cath appliance tip projects over the midportion of the SVC. IMPRESSION: COPD with extensive pleuroparenchymal changes on the right. Chronically increased interstitial markings diffusely. No alveolar pneumonia. No recurrent pneumothorax. Retraction of the hilar structures superiorly, bilaterally with somewhat increase conspicuity of the left hilum today. This may reflect increased lymphadenopathy given the history of non-Hodgkin's lymphoma. Follow-up chest CT scanning is recommended. No CHF.  Thoracic aortic atherosclerosis. Electronically Signed   By: David  Martinique M.D.   On: 09/11/2016 12:50   Dg Chest Port 1 View  Result Date: 09/11/2016 CLINICAL DATA:  71 year old female with shortness of breath and sepsis. History of non-Hodgkin's  lymphoma and prior radiation. EXAM: PORTABLE CHEST 1 VIEW COMPARISON:  Chest radiograph dated 09/11/2016 and CT dated 08/30/2015 FINDINGS: Left pectoral Port-A-Cath with tip over central SVC is in stable positioning. Postradiation changes with pleuroparenchymal thickening and scarring involving the right apex and bilateral suprahilar region appears similar the prior radiograph. There is associated mild traction bronchiectasis with mild deviation of the trachea to the right. Stable appearing right lung base densities in likely combination of a small possibly loculated pleural effusion and pleural thickening/ scarring. No new lung opacity. There is no pneumothorax. Stable cardiac silhouette. No acute osseous pathology. IMPRESSION: 1. No interval change.  No new pulmonary opacity. 2. Postradiation changes with pleuroparenchymal fibrosis and scarring predominantly in the right apex and suprahilar region. 3. Stable right lung base densities  likely combination of small loculated pleural fluid and pleural thickening/scarring. Electronically Signed   By: Anner Crete M.D.   On: 09/11/2016 23:03     Procedures   Critical Care performed:CRITICAL CARE Performed by: Gregor Hams   Total critical care time: 30 minutes  Critical care time was exclusive of separately billable procedures and treating other patients.  Critical care was necessary to treat or prevent imminent or life-threatening deterioration.  Critical care was time spent personally by me on the following activities: development of treatment plan with patient and/or surrogate as well as nursing, discussions with consultants, evaluation of patient's response to treatment, examination of patient, obtaining history from patient or surrogate, ordering and performing treatments and interventions, ordering and review of laboratory studies, ordering and review of radiographic studies, pulse oximetry and re-evaluation of patient's condition.   ____________________________________________   INITIAL IMPRESSION / ASSESSMENT AND PLAN / ED COURSE  Pertinent labs & imaging results that were available during my care of the patient were reviewed by me and considered in my medical decision making (see chart for details).  Given history and physical exam presenting vital signs concern for possible sepsis on presentation. As such patient received 30 of muscle or kilogram of IV normal saline and broad-spectrum antibiotic therapy.      ____________________________________________  FINAL CLINICAL IMPRESSION(S) / ED DIAGNOSES  Final diagnoses:  Sepsis, due to unspecified organism [facility])     MEDICATIONS GIVEN DURING THIS VISIT:  Medications  sodium chloride 0.9 % bolus 1,000 mL (1,000 mLs Intravenous New Bag/Given 09/11/16 2252)    And  sodium chloride 0.9 % bolus 500 mL (not administered)    And  sodium chloride 0.9 % bolus 250 mL (not administered)  vancomycin (VANCOCIN) IVPB 1000 mg/200 mL premix (1,000 mg Intravenous New Bag/Given 09/11/16 2327)  ceFEPIme (MAXIPIME) 1 GM / 47mL IVPB premix (1 g Intravenous New Bag/Given 09/11/16 2252)     NEW OUTPATIENT MEDICATIONS STARTED DURING THIS VISIT:  New Prescriptions   No medications on file    Modified Medications   No medications on file    Discontinued Medications   No medications on file     Note:  This document was prepared using Dragon voice recognition software and may include unintentional dictation errors.    Gregor Hams, MD 09/12/16 [phone]

## 2016-09-11 NOTE — ED Triage Notes (Addendum)
Pt presents to ED with weakness and acting disoriented per her husband. Pt had recent dx of pneumonia and was treated with Levaquin which she has completed. Also recently completed her prescribed prednisone. Xray repeated today. Pt husband states this evening pt "was sleeping a lot and he couldn't get her to eat or drink anything". Pt quiet in triage. No answering questions and has eyes closed. Pt denies pain at this time. Reports having sob.

## 2016-09-12 ENCOUNTER — Encounter
Admission: EM | Disposition: A | Discharge status: Home Health | Payer: Self-pay | Source: Home / Self Care | Attending: Internal Medicine

## 2016-09-12 ENCOUNTER — Ambulatory Visit: Admission: RE | Admit: 2016-09-12 | Payer: Medicare Other | Source: Ambulatory Visit | Admitting: Internal Medicine

## 2016-09-12 DIAGNOSIS — J701 Chronic and other pulmonary manifestations due to radiation: Secondary | ICD-10-CM | POA: Diagnosis not present

## 2016-09-12 DIAGNOSIS — R0600 Dyspnea, unspecified: Secondary | ICD-10-CM

## 2016-09-12 DIAGNOSIS — J209 Acute bronchitis, unspecified: Secondary | ICD-10-CM | POA: Diagnosis not present

## 2016-09-12 DIAGNOSIS — D630 Anemia in neoplastic disease: Secondary | ICD-10-CM | POA: Diagnosis not present

## 2016-09-12 DIAGNOSIS — J9620 Acute and chronic respiratory failure, unspecified whether with hypoxia or hypercapnia: Secondary | ICD-10-CM | POA: Diagnosis not present

## 2016-09-12 DIAGNOSIS — J189 Pneumonia, unspecified organism: Secondary | ICD-10-CM | POA: Diagnosis not present

## 2016-09-12 DIAGNOSIS — E876 Hypokalemia: Secondary | ICD-10-CM | POA: Diagnosis not present

## 2016-09-12 DIAGNOSIS — E039 Hypothyroidism, unspecified: Secondary | ICD-10-CM | POA: Diagnosis not present

## 2016-09-12 DIAGNOSIS — A419 Sepsis, unspecified organism: Secondary | ICD-10-CM | POA: Diagnosis present

## 2016-09-12 DIAGNOSIS — N179 Acute kidney failure, unspecified: Secondary | ICD-10-CM | POA: Diagnosis not present

## 2016-09-12 DIAGNOSIS — F329 Major depressive disorder, single episode, unspecified: Secondary | ICD-10-CM | POA: Diagnosis not present

## 2016-09-12 DIAGNOSIS — Z66 Do not resuscitate: Secondary | ICD-10-CM | POA: Diagnosis not present

## 2016-09-12 DIAGNOSIS — Z7951 Long term (current) use of inhaled steroids: Secondary | ICD-10-CM | POA: Diagnosis not present

## 2016-09-12 DIAGNOSIS — D899 Disorder involving the immune mechanism, unspecified: Secondary | ICD-10-CM | POA: Diagnosis not present

## 2016-09-12 DIAGNOSIS — C8590 Non-Hodgkin lymphoma, unspecified, unspecified site: Secondary | ICD-10-CM | POA: Diagnosis not present

## 2016-09-12 DIAGNOSIS — I959 Hypotension, unspecified: Secondary | ICD-10-CM | POA: Diagnosis not present

## 2016-09-12 DIAGNOSIS — J45909 Unspecified asthma, uncomplicated: Secondary | ICD-10-CM | POA: Diagnosis not present

## 2016-09-12 DIAGNOSIS — R634 Abnormal weight loss: Secondary | ICD-10-CM | POA: Diagnosis not present

## 2016-09-12 DIAGNOSIS — R6520 Severe sepsis without septic shock: Secondary | ICD-10-CM

## 2016-09-12 DIAGNOSIS — R0602 Shortness of breath: Secondary | ICD-10-CM | POA: Diagnosis present

## 2016-09-12 DIAGNOSIS — Y842 Radiological procedure and radiotherapy as the cause of abnormal reaction of the patient, or of later complication, without mention of misadventure at the time of the procedure: Secondary | ICD-10-CM | POA: Diagnosis not present

## 2016-09-12 DIAGNOSIS — E860 Dehydration: Secondary | ICD-10-CM | POA: Diagnosis not present

## 2016-09-12 DIAGNOSIS — Z7982 Long term (current) use of aspirin: Secondary | ICD-10-CM | POA: Diagnosis not present

## 2016-09-12 LAB — URINALYSIS, ROUTINE W REFLEX MICROSCOPIC
Bacteria, UA: NONE SEEN
Bilirubin Urine: NEGATIVE
GLUCOSE, UA: 50 mg/dL — AB
HGB URINE DIPSTICK: NEGATIVE
Ketones, ur: NEGATIVE mg/dL
Leukocytes, UA: NEGATIVE
Nitrite: NEGATIVE
Protein, ur: 100 mg/dL — AB
SPECIFIC GRAVITY, URINE: 1.019 (ref 1.005–1.030)
pH: 6 (ref 5.0–8.0)

## 2016-09-12 LAB — MRSA PCR SCREENING: MRSA BY PCR: NEGATIVE

## 2016-09-12 LAB — INFLUENZA PANEL BY PCR (TYPE A & B)
INFLAPCR: NEGATIVE
Influenza B By PCR: NEGATIVE

## 2016-09-12 LAB — LACTIC ACID, PLASMA: Lactic Acid, Venous: 1 mmol/L (ref 0.5–1.9)

## 2016-09-12 SURGERY — BRONCHOSCOPY, FLEXIBLE
Anesthesia: Moderate Sedation

## 2016-09-12 MED ORDER — AZITHROMYCIN 250 MG PO TABS
250.0000 mg | ORAL_TABLET | Freq: Every day | ORAL | Status: DC
  Administered 2016-09-13: 250 mg via ORAL
  Filled 2016-09-12: qty 1

## 2016-09-12 MED ORDER — SODIUM CHLORIDE 0.9% FLUSH
3.0000 mL | Freq: Two times a day (BID) | INTRAVENOUS | Status: DC
  Administered 2016-09-12 (×3): 3 mL via INTRAVENOUS

## 2016-09-12 MED ORDER — IPRATROPIUM-ALBUTEROL 0.5-2.5 (3) MG/3ML IN SOLN
RESPIRATORY_TRACT | Status: AC
  Administered 2016-09-12: 03:00:00
  Filled 2016-09-12: qty 3

## 2016-09-12 MED ORDER — VITAMIN C 500 MG PO TABS
500.0000 mg | ORAL_TABLET | Freq: Every day | ORAL | Status: DC
  Administered 2016-09-12 – 2016-09-13 (×2): 500 mg via ORAL
  Filled 2016-09-12 (×2): qty 1

## 2016-09-12 MED ORDER — METHYLPREDNISOLONE SODIUM SUCC 40 MG IJ SOLR
40.0000 mg | Freq: Two times a day (BID) | INTRAMUSCULAR | Status: DC
Start: 2016-09-12 — End: 2016-09-13
  Administered 2016-09-12 – 2016-09-13 (×3): 40 mg via INTRAVENOUS
  Filled 2016-09-12 (×3): qty 1

## 2016-09-12 MED ORDER — ENSURE ENLIVE PO LIQD
237.0000 mL | Freq: Two times a day (BID) | ORAL | Status: DC
  Administered 2016-09-12 – 2016-09-13 (×2): 237 mL via ORAL

## 2016-09-12 MED ORDER — MOMETASONE FURO-FORMOTEROL FUM 200-5 MCG/ACT IN AERO
2.0000 | INHALATION_SPRAY | Freq: Two times a day (BID) | RESPIRATORY_TRACT | Status: DC
  Administered 2016-09-12 – 2016-09-13 (×3): 2 via RESPIRATORY_TRACT
  Filled 2016-09-12: qty 8.8

## 2016-09-12 MED ORDER — ALBUTEROL SULFATE (2.5 MG/3ML) 0.083% IN NEBU
2.5000 mg | INHALATION_SOLUTION | RESPIRATORY_TRACT | Status: DC
  Administered 2016-09-12 (×3): 2.5 mg via RESPIRATORY_TRACT
  Filled 2016-09-12 (×3): qty 3

## 2016-09-12 MED ORDER — UMECLIDINIUM BROMIDE 62.5 MCG/INH IN AEPB
1.0000 | INHALATION_SPRAY | Freq: Every day | RESPIRATORY_TRACT | Status: DC
  Administered 2016-09-12 – 2016-09-13 (×2): 1 via RESPIRATORY_TRACT
  Filled 2016-09-12: qty 7

## 2016-09-12 MED ORDER — DEXTROMETHORPHAN POLISTIREX ER 30 MG/5ML PO SUER
30.0000 mg | Freq: Every day | ORAL | Status: DC
  Administered 2016-09-12 – 2016-09-13 (×2): 30 mg via ORAL
  Filled 2016-09-12 (×2): qty 5

## 2016-09-12 MED ORDER — BUDESONIDE 0.5 MG/2ML IN SUSP
0.5000 mg | Freq: Two times a day (BID) | RESPIRATORY_TRACT | Status: DC
  Administered 2016-09-12 – 2016-09-13 (×2): 0.5 mg via RESPIRATORY_TRACT
  Filled 2016-09-12 (×2): qty 2

## 2016-09-12 MED ORDER — PANTOPRAZOLE SODIUM 40 MG PO TBEC
40.0000 mg | DELAYED_RELEASE_TABLET | Freq: Every day | ORAL | Status: DC
  Administered 2016-09-12 – 2016-09-13 (×2): 40 mg via ORAL
  Filled 2016-09-12 (×2): qty 1

## 2016-09-12 MED ORDER — AZITHROMYCIN 500 MG PO TABS
500.0000 mg | ORAL_TABLET | Freq: Every day | ORAL | Status: AC
  Administered 2016-09-12: 500 mg via ORAL
  Filled 2016-09-12: qty 1

## 2016-09-12 MED ORDER — ACETAMINOPHEN 325 MG PO TABS
650.0000 mg | ORAL_TABLET | Freq: Four times a day (QID) | ORAL | Status: DC | PRN
  Administered 2016-09-12: 650 mg via ORAL
  Filled 2016-09-12: qty 2

## 2016-09-12 MED ORDER — ASPIRIN EC 81 MG PO TBEC
81.0000 mg | DELAYED_RELEASE_TABLET | Freq: Every day | ORAL | Status: DC
  Administered 2016-09-12 – 2016-09-13 (×2): 81 mg via ORAL
  Filled 2016-09-12 (×2): qty 1

## 2016-09-12 MED ORDER — GUAIFENESIN ER 600 MG PO TB12
600.0000 mg | ORAL_TABLET | Freq: Every day | ORAL | Status: DC
  Administered 2016-09-12 – 2016-09-13 (×2): 600 mg via ORAL
  Filled 2016-09-12 (×2): qty 1

## 2016-09-12 MED ORDER — CITALOPRAM HYDROBROMIDE 20 MG PO TABS
20.0000 mg | ORAL_TABLET | Freq: Every day | ORAL | Status: DC
  Administered 2016-09-12 – 2016-09-13 (×2): 20 mg via ORAL
  Filled 2016-09-12 (×2): qty 1

## 2016-09-12 MED ORDER — SODIUM CHLORIDE 0.9 % IV SOLN
INTRAVENOUS | Status: DC
  Administered 2016-09-12 – 2016-09-13 (×2): via INTRAVENOUS

## 2016-09-12 MED ORDER — ONDANSETRON HCL 4 MG PO TABS: 4.0000 mg | ORAL_TABLET | Freq: Four times a day (QID) | ORAL | Status: DC | PRN

## 2016-09-12 MED ORDER — ENOXAPARIN SODIUM 40 MG/0.4ML ~~LOC~~ SOLN
40.0000 mg | SUBCUTANEOUS | Status: DC
  Administered 2016-09-12: 40 mg via SUBCUTANEOUS
  Filled 2016-09-12: qty 0.4

## 2016-09-12 MED ORDER — IPRATROPIUM-ALBUTEROL 0.5-2.5 (3) MG/3ML IN SOLN
3.0000 mL | RESPIRATORY_TRACT | Status: DC
  Administered 2016-09-12 – 2016-09-13 (×6): 3 mL via RESPIRATORY_TRACT
  Filled 2016-09-12 (×6): qty 3

## 2016-09-12 MED ORDER — ACETAMINOPHEN 650 MG RE SUPP: 650.0000 mg | Freq: Four times a day (QID) | RECTAL | Status: DC | PRN

## 2016-09-12 MED ORDER — DM-GUAIFENESIN ER 30-600 MG PO TB12: 1.0000 | ORAL_TABLET | Freq: Every day | ORAL | Status: DC

## 2016-09-12 MED ORDER — DEXTROSE 5 % IV SOLN
1.0000 g | INTRAVENOUS | Status: DC
  Administered 2016-09-12: 1 g via INTRAVENOUS
  Filled 2016-09-12 (×2): qty 10

## 2016-09-12 MED ORDER — LEVOTHYROXINE SODIUM 50 MCG PO TABS
50.0000 ug | ORAL_TABLET | Freq: Every day | ORAL | Status: DC
  Administered 2016-09-12 – 2016-09-13 (×2): 50 ug via ORAL
  Filled 2016-09-12 (×2): qty 1

## 2016-09-12 MED ORDER — ONDANSETRON HCL 4 MG/2ML IJ SOLN: 4.0000 mg | Freq: Four times a day (QID) | INTRAMUSCULAR | Status: DC | PRN

## 2016-09-12 MED ORDER — DOCUSATE SODIUM 100 MG PO CAPS
100.0000 mg | ORAL_CAPSULE | Freq: Two times a day (BID) | ORAL | Status: DC
  Administered 2016-09-12 – 2016-09-13 (×3): 100 mg via ORAL
  Filled 2016-09-12 (×3): qty 1

## 2016-09-12 MED ORDER — POTASSIUM CHLORIDE CRYS ER 20 MEQ PO TBCR
20.0000 meq | EXTENDED_RELEASE_TABLET | Freq: Two times a day (BID) | ORAL | Status: DC
  Administered 2016-09-12 – 2016-09-13 (×4): 20 meq via ORAL
  Filled 2016-09-12 (×4): qty 1

## 2016-09-12 NOTE — H&P (Signed)
Anna Mcbride is an 71 y.o. female.   Chief Complaint: Shortness of breath HPI: The patient with past medical history of acute respiratory failure as well as diffuse B-cell lymphoma presents emergency department with shortness of breath. This is been a chronic problem since undergoing treatment for lymphoma. She was scheduled for flexible bronchoscopy this morning but felt as if she may need evaluation earlier than scheduled. Upon presentation to the emergency department the patient was tachypneic and had hypotension concerning for sepsis. Chest x-ray showed diffuse scarring in her lung bases bilaterally. Clinically the patient displayed signs and symptoms consistent with pneumonia. The emergency department staff started broad spectrum antibiotics prior to calling the hospitalist service for further management.  Past Medical History:  Diagnosis Date  . Acute respiratory failure ([place]) 11/29/2014  . 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 Asthma   . Non Hodgkin's lymphoma [facility])     Past Surgical History:  Procedure Laterality Date  . 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 NASAL SINUS SURGERY  08/07/05 09/09/05   x 2   . VIDEO BRONCHOSCOPY Bilateral 11/30/2014   Procedure: VIDEO BRONCHOSCOPY WITHOUT FLUORO;  Surgeon: Flora Lipps, MD;  Location: [facility] ORS;  Service: Cardiopulmonary;  Laterality: Bilateral;    Family History  Problem Relation Age of Onset  . Asthma Son   . Congestive Heart Failure Mother   . Diabetes Brother    Social History:  reports that she has never smoked. She has never used smokeless tobacco. She reports that she drinks alcohol. She reports that she does not use drugs.  Allergies:  Allergies  Allergen Reactions  . Hydrocodone-Homatropine Itching  . Tussionex Pennkinetic Er [Hydrocod Polst-Cpm Polst Er] Itching  . Ciprofloxacin Rash    Medications Prior to Admission  Medication Sig Dispense Refill  . albuterol (PROVENTIL HFA;VENTOLIN HFA) 108 (90 BASE) MCG/ACT  inhaler Inhale 1 puff into the lungs every 6 (six) hours as needed for wheezing or shortness of breath.    [name] albuterol (PROVENTIL) (2.5 MG/3ML) 0.083% nebulizer solution Take 3 mLs (2.5 mg total) by nebulization every 4 (four) hours as needed for wheezing or shortness of breath. 75 mL 12  . AMBULATORY NON FORMULARY MEDICATION Medication Name: Incentive Spirometer Use 10-15 times daily 1 each 0  . aspirin EC 81 MG tablet Take 81 mg by mouth daily.    . citalopram (CELEXA) 20 MG tablet TAKE 1 TABLET BY MOUTH ONCE DAILY 90 tablet 3  . dextromethorphan-guaiFENesin (MUCINEX DM) 30-600 MG 12hr tablet Take 1 tablet by mouth daily.    . fluticasone-salmeterol (ADVAIR HFA) 115-21 MCG/ACT inhaler Inhale 2 puffs into the lungs 2 (two) times daily. 1 Inhaler 12  . levothyroxine (SYNTHROID, LEVOTHROID) 25 MCG tablet TAKE 1 TABLET BY MOUTH ONCE DAILY ON AN EMPTY STOMACH. WAIT 30 MINUTES BEFORE TAKING OTHER MEDS. 30 tablet 2  . omeprazole (PRILOSEC) 20 MG capsule Take 1 capsule (20 mg total) by mouth 2 (two) times daily before a meal. (Patient taking differently: Take 20 mg by mouth daily. ) 60 capsule 3  . ondansetron (ZOFRAN) 4 MG tablet TAKE 1 TABLET BY MOUTH EVERY 8 HOURS AS NEEDED NAUSEA AND VOMITING 30 tablet 1  . potassium chloride SA (K-DUR,KLOR-CON) 20 MEQ tablet TAKE 1 TABLET BY MOUTH TWICE DAILY 30 tablet 1  . Respiratory Therapy Supplies (FLUTTER) DEVI Use 10-15 times daily 1 each 0  . umeclidinium bromide (INCRUSE ELLIPTA) 62.5 MCG/INH AEPB Inhale 1 puff into the lungs daily. 30 each 5  .  vitamin C (ASCORBIC ACID) 500 MG tablet Take 500 mg by mouth daily.      Results for orders placed or performed during the hospital encounter of 09/11/16 (from the past 48 hour(s))  TSH     Status: Abnormal   Collection Time: 09/11/16 10:31 PM  Result Value Ref Range   TSH 4.515 (H) 0.350 - 4.500 uIU/mL    Comment: Performed by a 3rd Generation assay with a functional sensitivity of <=0.01 uIU/mL.  T4, free      Status: None   Collection Time: 09/11/16 10:31 PM  Result Value Ref Range   Free T4 1.04 0.61 - 1.12 ng/dL    Comment: (NOTE) Biotin ingestion may interfere with free T4 tests. If the results are inconsistent with the TSH level, previous test results, or the clinical presentation, then consider biotin interference. If needed, order repeat testing after stopping biotin.   Lactic acid, plasma     Status: None   Collection Time: 09/11/16 10:32 PM  Result Value Ref Range   Lactic Acid, Venous 1.1 0.5 - 1.9 mmol/L  Comprehensive metabolic panel     Status: Abnormal   Collection Time: 09/11/16 10:32 PM  Result Value Ref Range   Sodium 138 135 - 145 mmol/L   Potassium 3.2 (L) 3.5 - 5.1 mmol/L   Chloride 103 101 - 111 mmol/L   CO2 27 22 - 32 mmol/L   Glucose, Bld 106 (H) 65 - 99 mg/dL   BUN 27 (H) 6 - 20 mg/dL   Creatinine, Ser 1.10 (H) 0.44 - 1.00 mg/dL   Calcium 8.8 (L) 8.9 - 10.3 mg/dL   Total Protein 6.8 6.5 - 8.1 g/dL   Albumin 3.3 (L) 3.5 - 5.0 g/dL   AST 20 15 - 41 U/L   ALT 22 14 - 54 U/L   Alkaline Phosphatase 67 38 - 126 U/L   Total Bilirubin 0.8 0.3 - 1.2 mg/dL   GFR calc non Af Amer 49 (L) >60 mL/min   GFR calc Af Amer 57 (L) >60 mL/min    Comment: (NOTE) The eGFR has been calculated using the CKD EPI equation. This calculation has not been validated in all clinical situations. eGFR's persistently <60 mL/min signify possible Chronic Kidney Disease.    Anion gap 8 5 - 15  Lipase, blood     Status: None   Collection Time: 09/11/16 10:32 PM  Result Value Ref Range   Lipase 22 11 - 51 U/L  Troponin I     Status: None   Collection Time: 09/11/16 10:32 PM  Result Value Ref Range   Troponin I <0.03 <0.03 ng/mL  CBC WITH DIFFERENTIAL     Status: Abnormal   Collection Time: 09/11/16 10:32 PM  Result Value Ref Range   WBC 7.7 3.6 - 11.0 K/uL   RBC 4.14 3.80 - 5.20 MIL/uL   Hemoglobin 13.5 12.0 - 16.0 g/dL   HCT 40.3 35.0 - 47.0 %   MCV 97.5 80.0 - 100.0 fL   MCH 32.7  26.0 - 34.0 pg   MCHC 33.5 32.0 - 36.0 g/dL   RDW 17.0 (H) 11.5 - 14.5 %   Platelets 153 150 - 440 K/uL   Neutrophils Relative % 72 %   Neutro Abs 5.5 1.4 - 6.5 K/uL   Lymphocytes Relative 11 %   Lymphs Abs 0.9 (L) 1.0 - 3.6 K/uL   Monocytes Relative 13 %   Monocytes Absolute 1.0 (H) 0.2 - 0.9 K/uL   Eosinophils Relative  4 %   Eosinophils Absolute 0.3 0 - 0.7 K/uL   Basophils Relative 0 %   Basophils Absolute 0.0 0 - 0.1 K/uL  Procalcitonin     Status: None   Collection Time: 09/11/16 10:32 PM  Result Value Ref Range   Procalcitonin <0.10 ng/mL    Comment:        Interpretation: PCT (Procalcitonin) <= 0.5 ng/mL: Systemic infection (sepsis) is not likely. Local bacterial infection is possible. (NOTE)         ICU PCT Algorithm               Non ICU PCT Algorithm    ----------------------------     ------------------------------         PCT < 0.25 ng/mL                 PCT < 0.1 ng/mL     Stopping of antibiotics            Stopping of antibiotics       strongly encouraged.               strongly encouraged.    ----------------------------     ------------------------------       PCT level decrease by               PCT < 0.25 ng/mL       >= 80% from peak PCT       OR PCT 0.25 - 0.5 ng/mL          Stopping of antibiotics                                             encouraged.     Stopping of antibiotics           encouraged.    ----------------------------     ------------------------------       PCT level decrease by              PCT >= 0.25 ng/mL       < 80% from peak PCT        AND PCT >= 0.5 ng/mL            Continuin g antibiotics                                              encouraged.       Continuing antibiotics            encouraged.    ----------------------------     ------------------------------     PCT level increase compared          PCT > 0.5 ng/mL         with peak PCT AND          PCT >= 0.5 ng/mL             Escalation of antibiotics                                           strongly encouraged.      Escalation of antibiotics        strongly encouraged.  Protime-INR     Status: None   Collection Time: 09/11/16 10:32 PM  Result Value Ref Range   Prothrombin Time 13.4 11.4 - 15.2 seconds   INR 1.02   Blood Culture (routine x 2)     Status: None (Preliminary result)   Collection Time: 09/11/16 10:32 PM  Result Value Ref Range   Specimen Description BLOOD LEFT ANTECUBITAL    Special Requests BOTTLES DRAWN AEROBIC AND ANAEROBIC  BCHV    Culture NO GROWTH < 12 HOURS    Report Status PENDING   Blood Culture (routine x 2)     Status: None (Preliminary result)   Collection Time: 09/11/16 10:32 PM  Result Value Ref Range   Specimen Description BLOOD RIGHT WRIST    Special Requests BOTTLES DRAWN AEROBIC AND ANAEROBIC  BCAV    Culture NO GROWTH < 12 HOURS    Report Status PENDING   Influenza panel by PCR (type A & B)     Status: None   Collection Time: 09/11/16 11:35 PM  Result Value Ref Range   Influenza A By PCR NEGATIVE NEGATIVE   Influenza B By PCR NEGATIVE NEGATIVE    Comment: (NOTE) The Xpert Xpress Flu assay is intended as an aid in the diagnosis of  influenza and should not be used as a sole basis for treatment.  This  assay is FDA approved for nasopharyngeal swab specimens only. Nasal  washings and aspirates are unacceptable for Xpert Xpress Flu testing.   Urinalysis, Routine w reflex microscopic     Status: Abnormal   Collection Time: 09/12/16  1:10 AM  Result Value Ref Range   Color, Urine YELLOW (A) YELLOW   APPearance CLEAR (A) CLEAR   Specific Gravity, Urine 1.019 1.005 - 1.030   pH 6.0 5.0 - 8.0   Glucose, UA 50 (A) NEGATIVE mg/dL   Hgb urine dipstick NEGATIVE NEGATIVE   Bilirubin Urine NEGATIVE NEGATIVE   Ketones, ur NEGATIVE NEGATIVE mg/dL   Protein, ur 100 (A) NEGATIVE mg/dL   Nitrite NEGATIVE NEGATIVE   Leukocytes, UA NEGATIVE NEGATIVE   RBC / HPF 0-5 0 - 5 RBC/hpf   WBC, UA 0-5 0 - 5 WBC/hpf   Bacteria, UA  NONE SEEN NONE SEEN   Squamous Epithelial / LPF 0-5 (A) NONE SEEN  Lactic acid, plasma     Status: None   Collection Time: 09/12/16  2:47 AM  Result Value Ref Range   Lactic Acid, Venous 1.0 0.5 - 1.9 mmol/L   Dg Chest 2 View  Result Date: 09/11/2016 CLINICAL DATA:  Two weeks of chest congestion and shortness of breath. History of asthma -COPD and recurrent upper respiratory infections. Previous history of pneumothorax. History of non-Hodgkin's lymphoma. EXAM: CHEST  2 VIEW COMPARISON:  CT scan of the chest of June 28, 2016 and chest x-ray of June 20, 2016. FINDINGS: There are stable extensive chronic changes in the right lung with volume loss and parenchymal scarring and pleural thickening. There is no recurrent pneumothorax. There is stable shift of the mediastinum toward the right. The left lung is hyperinflated. The interstitial markings are chronically increased. There is soft tissue fullness in the left hilar region which is slightly more conspicuous today. The observed bony thorax exhibits no acute abnormality. The Port-A-Cath appliance tip projects over the midportion of the SVC. IMPRESSION: COPD with extensive pleuroparenchymal changes on the right. Chronically increased interstitial markings diffusely. No alveolar pneumonia. No recurrent pneumothorax. Retraction of the hilar structures superiorly, bilaterally with somewhat increase  conspicuity of the left hilum today. This may reflect increased lymphadenopathy given the history of non-Hodgkin's lymphoma. Follow-up chest CT scanning is recommended. No CHF.  Thoracic aortic atherosclerosis. Electronically Signed   By: David  Martinique M.D.   On: 09/11/2016 12:50   Dg Chest Port 1 View  Result Date: 09/11/2016 CLINICAL DATA:  71 year old female with shortness of breath and sepsis. History of non-Hodgkin's lymphoma and prior radiation. EXAM: PORTABLE CHEST 1 VIEW COMPARISON:  Chest radiograph dated 09/11/2016 and CT dated 08/30/2015 FINDINGS:  Left pectoral Port-A-Cath with tip over central SVC is in stable positioning. Postradiation changes with pleuroparenchymal thickening and scarring involving the right apex and bilateral suprahilar region appears similar the prior radiograph. There is associated mild traction bronchiectasis with mild deviation of the trachea to the right. Stable appearing right lung base densities in likely combination of a small possibly loculated pleural effusion and pleural thickening/ scarring. No new lung opacity. There is no pneumothorax. Stable cardiac silhouette. No acute osseous pathology. IMPRESSION: 1. No interval change.  No new pulmonary opacity. 2. Postradiation changes with pleuroparenchymal fibrosis and scarring predominantly in the right apex and suprahilar region. 3. Stable right lung base densities likely combination of small loculated pleural fluid and pleural thickening/scarring. Electronically Signed   By: Anner Crete M.D.   On: 09/11/2016 23:03    Review of Systems  Constitutional: Negative for chills and fever.  HENT: Negative for sore throat and tinnitus.   Eyes: Negative for blurred vision and redness.  Respiratory: Positive for cough and shortness of breath.   Cardiovascular: Negative for chest pain, palpitations, orthopnea and PND.  Gastrointestinal: Negative for abdominal pain, diarrhea, nausea and vomiting.  Genitourinary: Negative for dysuria, frequency and urgency.  Musculoskeletal: Negative for joint pain and myalgias.  Skin: Negative for rash.       No lesions  Neurological: Positive for weakness. Negative for speech change and focal weakness.  Endo/Heme/Allergies: Does not bruise/bleed easily.       No temperature intolerance  Psychiatric/Behavioral: Negative for depression and suicidal ideas.    Blood pressure (!) 98/52, pulse 90, temperature 98.3 F (36.8 C), temperature source Oral, resp. rate 16, height _0  (1.549 m), weight 59.6 kg (131 lb 4.8 oz), SpO2 97  %. Physical Exam  Vitals reviewed. Constitutional: She is oriented to person, place, and time. She appears well-developed and well-nourished. No distress.  HENT:  Head: Normocephalic and atraumatic.  Mouth/Throat: Oropharynx is clear and moist.  Eyes: Conjunctivae and EOM are normal. Pupils are equal, round, and reactive to light. No scleral icterus.  Neck: Normal range of motion. Neck supple. No JVD present. No tracheal deviation present. No thyromegaly present.  Cardiovascular: Normal rate, regular rhythm and normal heart sounds.  Exam reveals no gallop and no friction rub.   No murmur heard. Respiratory: Effort normal. She has wheezes. She has rales.  GI: Soft. Bowel sounds are normal. She exhibits no distension. There is no tenderness.  Genitourinary:  Genitourinary Comments: Deferred  Musculoskeletal: Normal range of motion. She exhibits no edema.  Lymphadenopathy:    She has no cervical adenopathy.  Neurological: She is alert and oriented to person, place, and time. No cranial nerve deficit. She exhibits normal muscle tone.  Skin: Skin is warm and dry. No rash noted. No erythema.  Psychiatric: She has a normal mood and affect. Her behavior is normal. Judgment and thought content normal.     Assessment/Plan This is a 71 year old female admitted for respiratory distress and acute kidney  injury. 1. Respiratory distress: Etiology is possibly pneumonia as the patient is immunocompromised. However she does not have distinct infiltrate on her chest x-ray. Her symptoms may be secondary to lung scarring from radiation therapy. At this time she does not meet criteria for sepsis. Hypotension is likely secondary to dehydration. Procalcitonin level is normal. Thus may scale back antibiotics at the discretion of the primary team. Possible bronchoscopy scheduled for this morning. 2. Acute kidney injury: Mild; secondary to dehydration. Hydrate with intravenous fluid. Encourage oral intake. 3.  Reactive airways disease: DuoNeb as needed. Continue Ellipta and inhaled corticosteroid per home regimen 4. Hypothyroidism: Continue Synthroid. Check TSH. 5. Depression: Continue citalopram 6. DVT prophylaxis: Lovenox 7. GI prophylaxis: PPI per home regimen The patient is a DO NOT RESUSCITATE. Time spent on admission orders and patient care approximately 45 minutes  Harrie Foreman, MD 09/12/2016, 7:49 AM

## 2016-09-12 NOTE — Progress Notes (Signed)
Procedure will not be performed at this time/day r/t BP, no new schedule date at this time. Alerted MD, orders to resume previous diet. Will continue to monitor.

## 2016-09-12 NOTE — Progress Notes (Signed)
Initial Nutrition Assessment  DOCUMENTATION CODES:   Not applicable  INTERVENTION:  Ensure Enlive once diet advances BID between meals.Each supplement provides 350 kcal and 20 grams of protein.  NUTRITION DIAGNOSIS:   Inadequate oral intake related to acute illness as evidenced by energy intake < or equal to 50% for > or equal to 5 days, per patient/family report.   GOAL:   Patient will meet greater than or equal to 90% of their needs   MONITOR:   PO intake, I & O's, Supplement acceptance, Labs, Weight trends  REASON FOR ASSESSMENT:   Malnutrition Screening Tool    ASSESSMENT:   71 yo female PMHx significant of B Cell Lymphoma s/p BMT, acute respiratory failure, presenting with generalized weakness worsening cough/congestion, and SOB. Pt completed a course of Levaquin and prednisone secondary to pneumonia PTA.  Patient reported appetite had been decreased for the past week due to weakness and worsening cough/congestion. Patient reported a little weight loss is likely due to lack of appetite (less than 50% of normal appetite over the past week), but unsure of exact amount. Per patient chart, weight has been stable around 117 lb. Patient reports UBW of 117 lb, although chart reports a reading of 131 lb. Patient's recorded weight yesterday was 117 lb, so weight today was likely inaccurate.  Patient reported appetite PTA was good and consisted of 3 meals per day. 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. Patient reported no nausea, vomiting, diarrhea. Patient also reported no problems with chewing and swallowing.   Patient agreed to trying Ensure Enlive to supplement current diet with extra protein and calories once diet advances.  Nutrition focused physical exam found no signs of muscle or fat depletion.  Labs reviewed: Potassium 3.2, Glucose 106, BUN 27, Creatinine 1.10, Calcium 8.8, Albumin 3.3.  Meds Reviewed: Colace, Potassium chloride, Vitamin  C  Diet Order:  Diet NPO time specified  Skin:  Reviewed, no issues  Last BM:  2/20  Height:   Ht Readings from Last 1 Encounters:  09/11/16 5\' 1"  (1.549 m)    Weight:   Wt Readings from Last 1 Encounters:  09/12/16 131 lb 4.8 oz (59.6 kg)    Ideal Body Weight:  47.7 kg  BMI:  Body mass index is 24.81 kg/m.  Estimated Nutritional Needs:   Kcal:  1600-1800 kcal (27-30 kcal/kg)  Protein:  60-72 (1-1.2g/day)  Fluid:  >/=1.6 mL  EDUCATION NEEDS:   No education needs identified at this time  Juliann Pulse M.S. Nutrition Dietetic Intern

## 2016-09-12 NOTE — ED Notes (Signed)
Breathing treatment given to pt.  md in with pt again.  Report given to matt rn.

## 2016-09-12 NOTE — Consult Note (Signed)
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  -This is been a chronic problem since undergoing treatment for lymphoma.  -She was scheduled for flexible bronchoscopy this morning but felt as if she may need evaluation earlier than scheduled.  -Upon presentation to the emergency department the patient was tachypneic and had hypotension concerning for sepsis.  -Chest x-ray showed diffuse scarring in her lung bases bilaterally. - Clinically the patient displayed signs and symptoms consistent with pneumonia.  -The emergency department staff started broad spectrum antibiotics prior to calling the hospitalist service for further management.   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 Prior to Admission medications   Medication Sig Start Date End Date Taking? Authorizing Provider  albuterol (PROVENTIL HFA;VENTOLIN HFA) 108 (90 BASE) MCG/ACT inhaler Inhale 1 puff into the lungs every 6 (six) hours as needed for wheezing or shortness of breath.   Yes Historical Provider, MD  albuterol (PROVENTIL) (2.5 MG/3ML) 0.083% nebulizer solution Take  3 mLs (2.5 mg total) by nebulization every 4 (four) hours as needed for wheezing or shortness of breath. 05/21/16  Yes Flora Lipps, MD  AMBULATORY NON FORMULARY MEDICATION Medication Name: Incentive Spirometer Use 10-15 times daily 07/29/16  Yes Flora Lipps, MD  aspirin EC 81 MG tablet Take 81 mg by mouth daily.   Yes Historical Provider, MD  citalopram (CELEXA) 20 MG tablet TAKE 1 TABLET BY MOUTH ONCE DAILY 06/07/16  Yes Cammie Sickle, MD  dextromethorphan-guaiFENesin (MUCINEX DM) 30-600 MG 12hr tablet Take 1 tablet by mouth daily.   Yes Historical Provider, MD  fluticasone-salmeterol (ADVAIR HFA) 115-21 MCG/ACT inhaler Inhale 2 puffs into the lungs 2 (two) times daily. 08/28/15  Yes Forest Gleason, MD  levothyroxine (SYNTHROID, LEVOTHROID) 25 MCG tablet TAKE 1 TABLET BY MOUTH ONCE DAILY ON AN EMPTY STOMACH. WAIT 30 MINUTES BEFORE TAKING OTHER MEDS. 08/19/16  Yes Cammie Sickle, MD  omeprazole (PRILOSEC) 20 MG capsule Take 1 capsule (20 mg total) by mouth 2 (two) times daily before a meal. Patient taking differently: Take 20 mg by mouth daily.  03/05/16  Yes Cammie Sickle, MD  ondansetron (ZOFRAN) 4 MG tablet TAKE 1 TABLET BY MOUTH EVERY 8 HOURS AS NEEDED NAUSEA AND VOMITING 07/03/16  Yes Cammie Sickle, MD  potassium chloride SA (K-DUR,KLOR-CON) 20 MEQ tablet TAKE 1 TABLET BY MOUTH TWICE DAILY 06/20/16  Yes Cammie Sickle, MD  Respiratory Therapy Supplies (FLUTTER) DEVI Use 10-15 times daily 07/29/16  Yes Flora Lipps, MD  umeclidinium bromide (INCRUSE ELLIPTA) 62.5 MCG/INH AEPB Inhale 1 puff into the lungs daily. 07/16/16  Yes Flora Lipps, MD  vitamin C (ASCORBIC ACID) 500 MG tablet Take 500 mg by mouth daily.   Yes Historical Provider, MD  Allergies  Allergen Reactions  . Hydrocodone-Homatropine Itching  . Tussionex Pennkinetic Er [Hydrocod Polst-Cpm Polst Er] Itching  . Ciprofloxacin Rash    FAMILY HISTORY:  family history includes Asthma in her son; Congestive  Heart Failure in her mother; Diabetes in her brother. SOCIAL HISTORY:  reports that she has never smoked. She has never used smokeless tobacco. She reports that she drinks alcohol. She reports that she does not use drugs.  REVIEW OF SYSTEMS:   Constitutional: Negative for fever, chills, weight loss,+ malaise/fatigue and +diaphoresis.  HENT: Negative for hearing loss, ear pain, nosebleeds, congestion, sore throat, neck pain, tinnitus and ear discharge.   Eyes: Negative for blurred vision, double vision, photophobia, pain, discharge and redness.  Respiratory: + cough,- hemoptysis,- sputum production, +shortness of breath,+ wheezing and -stridor.   Cardiovascular: Negative for chest pain, palpitations, orthopnea, claudication, leg swelling and PND.  Gastrointestinal: Negative for heartburn, nausea, vomiting, abdominal pain, diarrhea, constipation, blood in stool and melena.  Genitourinary: Negative for dysuria, urgency, frequency, hematuria and flank pain.  Musculoskeletal: Negative for myalgias, back pain, joint pain and falls.  Skin: Negative for itching and rash.  Neurological: Negative for dizziness, tingling, tremors, sensory change, speech change, focal weakness, seizures, loss of consciousness, weakness and headaches.  Endo/Heme/Allergies: Negative for environmental allergies and polydipsia. Does not bruise/bleed easily.    VITAL SIGNS: Temp:  [97.7 F (36.5 C)-99.1 F (37.3 C)] 98 F (36.7 C) (02/22 1122) Pulse Rate:  [90-107] 102 (02/22 1122) Resp:  [16-27] 16 (02/22 1122) BP: (81-129)/(45-77) 111/71 (02/22 1122) SpO2:  [89 %-100 %] 89 % (02/22 1122) FiO2 (%):  [28 %] 28 % (02/22 0845) Weight:  [117 lb (53.1 kg)-131 lb 4.8 oz (59.6 kg)] 131 lb 4.8 oz (59.6 kg) (02/22 0242)  Physical Examination:   GENERAL+weakness and + fatigue HEAD: Normocephalic, atraumatic.  EYES: Pupils equal, round, reactive to light. Extraocular muscles intact. No scleral icterus.  MOUTH: Moist mucosal  membrane. Dentition intact. No abscess noted.  EAR, NOSE, THROAT: Clear without exudates. No external lesions.  NECK: Supple. No thyromegaly. No nodules. No JVD.  PULMONARY: Diffuse coarse rhonchi right sided +wheezes CARDIOVASCULAR: S1 and S2. Regular rate and rhythm. No murmurs, rubs, or gallops. No edema. Pedal pulses 2+ bilaterally.  GASTROINTESTINAL: Soft, nontender, nondistended. No masses. Positive bowel sounds. No hepatosplenomegaly.  MUSCULOSKELETAL: No swelling, clubbing, or edema. Range of motion full in all extremities.  NEUROLOGIC: Cranial nerves II through XII are intact. No gross focal neurological deficits. Sensation intact. Reflexes intact.  SKIN: No ulceration, lesions, rashes, or cyanosis. Skin warm and dry. Turgor intact.  PSYCHIATRIC: Mood, affect within normal limits. The patient is awake, alert and oriented x 3. Insight, judgment intact.  ALL OTHER ROS ARE NEGATIVE       Recent Labs Lab 09/11/16 2232  NA 138  K 3.2*  CL 103  CO2 27  BUN 27*  CREATININE 1.10*  GLUCOSE 106*    Recent Labs Lab 09/11/16 2232  HGB 13.5  HCT 40.3  WBC 7.7  PLT 153   Dg Chest 2 View  Result Date: 09/11/2016 CLINICAL DATA:  Two weeks of chest congestion and shortness of breath. History of asthma -COPD and recurrent upper respiratory infections. Previous history of pneumothorax. History of non-Hodgkin's lymphoma. EXAM: CHEST  2 VIEW COMPARISON:  CT scan of the chest of June 28, 2016 and chest x-ray of June 20, 2016. FINDINGS: There are stable extensive chronic changes in the right lung with volume loss and parenchymal scarring and  pleural thickening. There is no recurrent pneumothorax. There is stable shift of the mediastinum toward the right. The left lung is hyperinflated. The interstitial markings are chronically increased. There is soft tissue fullness in the left hilar region which is slightly more conspicuous today. The observed bony thorax exhibits no acute  abnormality. The Port-A-Cath appliance tip projects over the midportion of the SVC. IMPRESSION: COPD with extensive pleuroparenchymal changes on the right. Chronically increased interstitial markings diffusely. No alveolar pneumonia. No recurrent pneumothorax. Retraction of the hilar structures superiorly, bilaterally with somewhat increase conspicuity of the left hilum today. This may reflect increased lymphadenopathy given the history of non-Hodgkin's lymphoma. Follow-up chest CT scanning is recommended. No CHF.  Thoracic aortic atherosclerosis. Electronically Signed   By: David  Martinique M.D.   On: 09/11/2016 12:50   Dg Chest Port 1 View  Result Date: 09/11/2016 CLINICAL DATA:  71 year old female with shortness of breath and sepsis. History of non-Hodgkin's lymphoma and prior radiation. EXAM: PORTABLE CHEST 1 VIEW COMPARISON:  Chest radiograph dated 09/11/2016 and CT dated 08/30/2015 FINDINGS: Left pectoral Port-A-Cath with tip over central SVC is in stable positioning. Postradiation changes with pleuroparenchymal thickening and scarring involving the right apex and bilateral suprahilar region appears similar the prior radiograph. There is associated mild traction bronchiectasis with mild deviation of the trachea to the right. Stable appearing right lung base densities in likely combination of a small possibly loculated pleural effusion and pleural thickening/ scarring. No new lung opacity. There is no pneumothorax. Stable cardiac silhouette. No acute osseous pathology. IMPRESSION: 1. No interval change.  No new pulmonary opacity. 2. Postradiation changes with pleuroparenchymal fibrosis and scarring predominantly in the right apex and suprahilar region. 3. Stable right lung base densities likely combination of small loculated pleural fluid and pleural thickening/scarring. Electronically Signed   By: Anner Crete M.D.   On: 09/11/2016 23:03   I have Independently reviewed images of CXR  09/12/2016 Interpretation: No new pulmonary opacity. 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 All questions answered  [name], M.D.  [facility]  Medical Director [place] Director [facility] Cardio-Pulmonary Department

## 2016-09-12 NOTE — Progress Notes (Signed)
Patient ID: Anna Mcbride, female   DOB: 12-Oct-1945, 71 y.o.   MRN: [id number]  [employer] [facility] PROGRESS NOTE  Anna Mcbride [id number] DOB: 12-04-1945 DOA: 09/11/2016 PCP: Madelyn Brunner, MD  HPI/Subjective: Patient feeling weak, shortness of breath. Some weight loss. Not feeling well. She was scheduled to have an outpatient bronchoscopy today  Objective: Vitals:   09/12/16 0737 09/12/16 1122  BP: (!) 98/52 111/71  Pulse: 90 (!) 102  Resp: 16 16  Temp: 98.3 F (36.8 C) 98 F (36.7 C)    Filed Weights   09/11/16 2208 09/12/16 0242  Weight: 53.1 kg (117 lb) 59.6 kg (131 lb 4.8 oz)    ROS: Review of Systems  Constitutional: Positive for weight loss. Negative for chills and fever.  Eyes: Negative for blurred vision.  Respiratory: Positive for cough, shortness of breath and wheezing.   Cardiovascular: Negative for chest pain.  Gastrointestinal: Negative for abdominal pain, constipation, diarrhea, nausea and vomiting.  Genitourinary: Negative for dysuria.  Musculoskeletal: Negative for joint pain.  Neurological: Positive for weakness. Negative for dizziness and headaches.   Exam: Physical Exam  Constitutional: She is oriented to person, place, and time.  HENT:  Nose: No mucosal edema.  Mouth/Throat: No oropharyngeal exudate or posterior oropharyngeal edema.  Eyes: Conjunctivae, EOM and lids are normal. Pupils are equal, round, and reactive to light.  Neck: No JVD present. Carotid bruit is not present. No edema present. No thyroid mass and no thyromegaly present.  Cardiovascular: S1 normal and S2 normal.  Exam reveals no gallop.   No murmur heard. Pulses:      Dorsalis pedis pulses are 2+ on the right side, and 2+ on the left side.  Respiratory: No respiratory distress. She has decreased breath sounds in the right middle field, the right lower field, the left middle field and the left lower field. She has no wheezes. She has no rhonchi. She has no rales.  GI: Soft.  Bowel sounds are normal. There is no tenderness.  Musculoskeletal:       Right ankle: She exhibits no swelling.       Left ankle: She exhibits no swelling.  Lymphadenopathy:    She has no cervical adenopathy.  Neurological: She is alert and oriented to person, place, and time. No cranial nerve deficit.  Skin: Skin is warm. No rash noted. Nails show no clubbing.  Psychiatric: She has a normal mood and affect.      Data Reviewed: Basic Metabolic Panel:  Recent Labs Lab 09/11/16 2232  NA 138  K 3.2*  CL 103  CO2 27  GLUCOSE 106*  BUN 27*  CREATININE 1.10*  CALCIUM 8.8*   Liver Function Tests:  Recent Labs Lab 09/11/16 2232  AST 20  ALT 22  ALKPHOS 67  BILITOT 0.8  PROT 6.8  ALBUMIN 3.3*    Recent Labs Lab 09/11/16 2232  LIPASE 22   CBC:  Recent Labs Lab 09/11/16 2232  WBC 7.7  NEUTROABS 5.5  HGB 13.5  HCT 40.3  MCV 97.5  PLT 153   Cardiac Enzymes:  Recent Labs Lab 09/11/16 2232  TROPONINI <0.03     Recent Results (from the past 240 hour(s))  Blood Culture (routine x 2)     Status: None (Preliminary result)   Collection Time: 09/11/16 10:32 PM  Result Value Ref Range Status   Specimen Description BLOOD LEFT ANTECUBITAL  Final   Special Requests BOTTLES DRAWN AEROBIC AND ANAEROBIC  [place]  Final  Culture NO GROWTH < 12 HOURS  Final   Report Status PENDING  Incomplete  Blood Culture (routine x 2)     Status: None (Preliminary result)   Collection Time: 09/11/16 10:32 PM  Result Value Ref Range Status   Specimen Description BLOOD RIGHT WRIST  Final   Special Requests BOTTLES DRAWN AEROBIC AND ANAEROBIC  BCAV  Final   Culture NO GROWTH < 12 HOURS  Final   Report Status PENDING  Incomplete  MRSA PCR Screening     Status: None   Collection Time: 09/12/16  8:22 AM  Result Value Ref Range Status   MRSA by PCR NEGATIVE NEGATIVE Final    Comment:        The GeneXpert MRSA Assay (FDA approved for NASAL specimens only), is one component of  a comprehensive MRSA colonization surveillance program. It is not intended to diagnose MRSA infection nor to guide or monitor treatment for MRSA infections.      Studies: Dg Chest 2 View  Result Date: 09/11/2016 CLINICAL DATA:  Two weeks of chest congestion and shortness of breath. History of asthma -COPD and recurrent upper respiratory infections. Previous history of pneumothorax. History of non-Hodgkin's lymphoma. EXAM: CHEST  2 VIEW COMPARISON:  CT scan of the chest of June 28, 2016 and chest x-ray of June 20, 2016. FINDINGS: There are stable extensive chronic changes in the right lung with volume loss and parenchymal scarring and pleural thickening. There is no recurrent pneumothorax. There is stable shift of the mediastinum toward the right. The left lung is hyperinflated. The interstitial markings are chronically increased. There is soft tissue fullness in the left hilar region which is slightly more conspicuous today. The observed bony thorax exhibits no acute abnormality. The Port-A-Cath appliance tip projects over the midportion of the SVC. IMPRESSION: COPD with extensive pleuroparenchymal changes on the right. Chronically increased interstitial markings diffusely. No alveolar pneumonia. No recurrent pneumothorax. Retraction of the hilar structures superiorly, bilaterally with somewhat increase conspicuity of the left hilum today. This may reflect increased lymphadenopathy given the history of non-Hodgkin's lymphoma. Follow-up chest CT scanning is recommended. No CHF.  Thoracic aortic atherosclerosis. Electronically Signed   By: David  Martinique M.D.   On: 09/11/2016 12:50   Dg Chest Port 1 View  Result Date: 09/11/2016 CLINICAL DATA:  71 year old female with shortness of breath and sepsis. History of non-Hodgkin's lymphoma and prior radiation. EXAM: PORTABLE CHEST 1 VIEW COMPARISON:  Chest radiograph dated 09/11/2016 and CT dated 08/30/2015 FINDINGS: Left pectoral Port-A-Cath with tip  over central SVC is in stable positioning. Postradiation changes with pleuroparenchymal thickening and scarring involving the right apex and bilateral suprahilar region appears similar the prior radiograph. There is associated mild traction bronchiectasis with mild deviation of the trachea to the right. Stable appearing right lung base densities in likely combination of a small possibly loculated pleural effusion and pleural thickening/ scarring. No new lung opacity. There is no pneumothorax. Stable cardiac silhouette. No acute osseous pathology. IMPRESSION: 1. No interval change.  No new pulmonary opacity. 2. Postradiation changes with pleuroparenchymal fibrosis and scarring predominantly in the right apex and suprahilar region. 3. Stable right lung base densities likely combination of small loculated pleural fluid and pleural thickening/scarring. Electronically Signed   By: Anner Crete M.D.   On: 09/11/2016 23:03    Scheduled Meds: . aspirin EC  81 mg Oral Daily  . azithromycin  500 mg Oral Daily   Followed by  . [START ON 09/13/2016] azithromycin  250 mg  Oral Daily  . budesonide (PULMICORT) nebulizer solution  0.5 mg Nebulization BID  . cefTRIAXone (ROCEPHIN)  IV  1 g Intravenous Q24H  . citalopram  20 mg Oral Daily  . dextromethorphan  30 mg Oral Daily   And  . guaiFENesin  600 mg Oral Daily  . docusate sodium  100 mg Oral BID  . enoxaparin (LOVENOX) injection  40 mg Subcutaneous Q24H  . feeding supplement (ENSURE ENLIVE)  237 mL Oral BID BM  . ipratropium-albuterol  3 mL Nebulization Q4H  . levothyroxine  50 mcg Oral QAC breakfast  . methylPREDNISolone (SOLU-MEDROL) injection  40 mg Intravenous Q12H  . mometasone-formoterol  2 puff Inhalation BID  . pantoprazole  40 mg Oral Daily  . potassium chloride SA  20 mEq Oral BID  . sodium chloride flush  3 mL Intravenous Q12H  . umeclidinium bromide  1 puff Inhalation Daily  . vitamin C  500 mg Oral Daily   Continuous Infusions: . sodium  chloride 125 mL/hr at 09/12/16 0303    Assessment/Plan:  1. Acute on chronic respiratory failure. Could be a radiation pneumonitis. Continue oxygen supplementation. Continue steroids. Patient put on aggressive antibiotics initially. Change antibiotics to Rocephin and Zithromax. The pro-calcitonin is negative. MRSA PCR is negative. Continue nebulizer treatments 2. Acute kidney injury and dehydration with hypotension. IV fluid hydration. Check creatinine tomorrow. 3. Hypokalemia potassium replacement 4. History of non-Hodgkin's lymphoma status post radiation. Weakness.  5. Physical therapy evaluation 6. Patient is a DO NOT RESUSCITATE  Code Status:     Code Status Orders        Start     Ordered   09/12/16 0224  Do not attempt resuscitation (DNR)  Continuous    Question Answer Comment  In the event of cardiac or respiratory ARREST Do not call a "code blue"   In the event of cardiac or respiratory ARREST Do not perform Intubation, CPR, defibrillation or ACLS   In the event of cardiac or respiratory ARREST Use medication by any route, position, wound care, and other measures to relive pain and suffering. May use oxygen, suction and manual treatment of airway obstruction as needed for comfort.      09/12/16 0223    Code Status History    Date Active Date Inactive Code Status Order ID Comments User Context   06/18/2016  6:21 PM 06/20/2016  3:35 PM DNR [id number]  Vaughan Basta, MD Inpatient   11/29/2014  2:05 PM 12/05/2014  3:50 PM DNR [id number]  Evlyn Kanner, NP Inpatient    Advance Directive Documentation   Flowsheet Row Most Recent Value  Type of Advance Directive  Healthcare Power of Attorney, Living will  Pre-existing out of facility DNR order (yellow form or pink MOST form)  No data  "MOST" Form in Place?  No data     Family Communication: Husband at the bedside  Disposition Plan: To be determined based on respiratory  status  Consultants:  Pulmonary  Procedures:  Bronchoscopy canceled  Antibiotics:  Rocephin  Zithromax  Time spent: 35 minutes  [place], [place]

## 2016-09-12 NOTE — ED Notes (Addendum)
Went to patients room to answer call light. Patient's husband met me at the door pacing with his arms crossed and is upset that patient has not been placed in a room. Patient's husband stated, "to either get her to a room now or he is taking her home." Patient spoke up and said, "it's just so cold in here waiting." I turned heat up in the room for patient comfort and offered a warm blanket. Husband then stated, "either get her in a room now or I am taking her home." This RN calmly explained to patient's husband that "unfortunately we don't have any control over how fast a patient gets a room." Husband then stated, " well I do get Dr Owens Shark in here now!" Pointing his finger and very upset. This RN came out and got Dr Owens Shark to go in and speak with patient and husband. Charge nurse also made aware.

## 2016-09-13 ENCOUNTER — Telehealth: Payer: Self-pay | Admitting: Internal Medicine

## 2016-09-13 DIAGNOSIS — R0602 Shortness of breath: Secondary | ICD-10-CM | POA: Diagnosis not present

## 2016-09-13 DIAGNOSIS — A419 Sepsis, unspecified organism: Secondary | ICD-10-CM | POA: Diagnosis not present

## 2016-09-13 DIAGNOSIS — J701 Chronic and other pulmonary manifestations due to radiation: Secondary | ICD-10-CM

## 2016-09-13 LAB — HEMOGLOBIN A1C
Hgb A1c MFr Bld: 5 % (ref 4.8–5.6)
Mean Plasma Glucose: 97

## 2016-09-13 LAB — URINE CULTURE: CULTURE: NO GROWTH

## 2016-09-13 MED ORDER — DEXTROSE 5 % IV SOLN
1.0000 g | Freq: Once | INTRAVENOUS | Status: AC
  Administered 2016-09-13: 1 g via INTRAVENOUS
  Filled 2016-09-13: qty 10

## 2016-09-13 MED ORDER — BUDESONIDE 0.5 MG/2ML IN SUSP: 0.5000 mg | Freq: Two times a day (BID) | RESPIRATORY_TRACT | 0 refills | Status: DC

## 2016-09-13 MED ORDER — PREDNISONE 20 MG PO TABS: 20.0000 mg | ORAL_TABLET | Freq: Every day | ORAL | 0 refills | Status: DC

## 2016-09-13 MED ORDER — PREDNISONE 5 MG PO TABS: ORAL_TABLET | ORAL | 0 refills | Status: DC

## 2016-09-13 MED ORDER — AZITHROMYCIN 250 MG PO TABS: ORAL_TABLET | ORAL | 0 refills | Status: DC

## 2016-09-13 MED ORDER — CEFUROXIME AXETIL 500 MG PO TABS: 500.0000 mg | ORAL_TABLET | Freq: Two times a day (BID) | ORAL | 0 refills | Status: DC

## 2016-09-13 MED ORDER — NYSTATIN 100000 UNIT/ML MT SUSP: 5.0000 mL | Freq: Four times a day (QID) | OROMUCOSAL | 0 refills | Status: DC

## 2016-09-13 MED ORDER — NYSTATIN 100000 UNIT/ML MT SUSP: 5.0000 mL | Freq: Four times a day (QID) | OROMUCOSAL | Status: DC

## 2016-09-13 NOTE — Plan of Care (Signed)
Problem: Bowel/Gastric: Goal: Will not experience complications related to bowel motility Outcome: Adequate for Discharge Pt has reached satisfactory goal levels for discharge,

## 2016-09-13 NOTE — Telephone Encounter (Signed)
Pt spouse calling regarding dose for Prednisone. States Dr. Mortimer Fries states pt was to have a maintenance dose, not a step down dose. Pt spouse states he will not fill this until he hears from [place]. Please call.

## 2016-09-13 NOTE — Telephone Encounter (Signed)
Spoke with DK and he states pt is to take Prednisone 20mg  daily until seen in office and then they will decide then what to continue. Husband informed. Nothing further needed.

## 2016-09-13 NOTE — Evaluation (Signed)
Physical Therapy Evaluation Patient Details Name: Anna Mcbride MRN: [id number] DOB: 07-Apr-1946 Today's Date: 09/13/2016   History of Present Illness  The patient with past medical history of acute respiratory failure as well as diffuse B-cell lymphoma presents emergency department with shortness of breath. This is been a chronic problem since undergoing treatment for lymphoma. She was scheduled for flexible bronchoscopy this morning but felt as if she may need evaluation earlier than scheduled. Upon presentation to the emergency department the patient was tachypneic and had hypotension concerning for sepsis. Chest x-ray showed diffuse scarring in her lung bases bilaterally. Clinically the patient displayed signs and symptoms consistent with pneumonia. The emergency department staff started broad spectrum antibiotics prior to calling the hospitalist service for further management. Pt reports feeling improved at time of PT evaluation but still not entirely well  Clinical Impression  Pt admitted with above diagnosis. Pt currently with functional limitations due to the deficits listed below (see PT Problem List). Pt continues to endorse feeling somewhat unwell. She demonstrates decreased step length and overall gait speed during ambulation. HR at 110 bpm at rest and increases to 120 bpm while walking. Pt with increased respiration rate with exertion and reports mild DOE. SaO2 remains >95% on 2L/min O2 which is her baseline O2 supplementation rate. Good stability with ambulation but pt overall appears deconditioned. She presents with generalized UE/LE weakness and would benefit from [facility] PT at discharge in order to return to pre-morbid state. Pt will benefit from skilled PT services to address deficits in strength, balance, and mobility in order to return to full function at home.      Follow Up Recommendations Home health PT    Equipment Recommendations  None recommended by PT    Recommendations for Other  Services       Precautions / Restrictions Precautions Precautions: Fall Restrictions Weight Bearing Restrictions: No      Mobility  Bed Mobility Overal bed mobility: Modified Independent             General bed mobility comments: HOB elevated and bed rails utilized. Increased time reuired  Transfers Overall transfer level: Needs assistance Equipment used: None Transfers: Sit to/from Stand Sit to Stand: Min guard         General transfer comment: Pt demonstrates safe hand placement and fair stability in standing without UE support. Mild increase in time required  Ambulation/Gait Ambulation/Gait assistance: Min guard Ambulation Distance (Feet): 100 Feet Assistive device: None Gait Pattern/deviations: Decreased step length - right;Decreased step length - left Gait velocity: Decreased Gait velocity interpretation: <1.8 ft/sec, indicative of risk for recurrent falls General Gait Details: Decreased step length and overall gait speed. HR at 110 bpm at rest and increases to 120. Pt with increased respiration rate with exertion and reports mild DOE. SaO2 remains >95% on 2L/min O2 which is her baseline O2 supplementation rate. Good stability with ambulation but pt overall appears deconditioned  Stairs            Wheelchair Mobility    Modified Rankin (Stroke Patients Only)       Balance Overall balance assessment: Needs assistance Sitting-balance support: No upper extremity supported Sitting balance-Leahy Scale: Good     Standing balance support: No upper extremity supported Standing balance-Leahy Scale: Fair Standing balance comment: Able to maintain feet apart/together balance without UE support. Positive Rhomberg for increased trunk sway. Single leg balance 5-7 seconds  Pertinent Vitals/Pain Pain Assessment: No/denies pain    Home Living Family/patient expects to be discharged to:: Private residence Living  Arrangements: Spouse/significant other Available Help at Discharge: Family Type of Home: House Home Access: Stairs to enter Entrance Stairs-Rails: None Entrance Stairs-Number of Steps: 1 Home Layout: One level Home Equipment: Other (comment);Shower seat - built in;Grab bars - tub/shower (Home O2)      Prior Function Level of Independence: Independent         Comments: Independent with ADLs/IADLs. Drives. No falls     Hand Dominance   Dominant Hand: Right    Extremity/Trunk Assessment   Upper Extremity Assessment Upper Extremity Assessment: Generalized weakness    Lower Extremity Assessment Lower Extremity Assessment: Generalized weakness       Communication   Communication: No difficulties  Cognition Arousal/Alertness: Awake/alert Behavior During Therapy: WFL for tasks assessed/performed Overall Cognitive Status: Within Functional Limits for tasks assessed                      General Comments      Exercises     Assessment/Plan    PT Assessment Patient needs continued PT services  PT Problem List Decreased strength;Decreased range of motion;Decreased activity tolerance;Decreased balance;Decreased mobility;Decreased knowledge of use of DME;Cardiopulmonary status limiting activity       PT Treatment Interventions DME instruction;Gait training;Stair training;Functional mobility training;Therapeutic activities;Therapeutic exercise;Balance training;Neuromuscular re-education;Patient/family education    PT Goals (Current goals can be found in the Care Plan section)  Acute Rehab PT Goals Patient Stated Goal: Return to prior level of function at home PT Goal Formulation: With patient/family Time For Goal Achievement: 09/27/16 Potential to Achieve Goals: Good    Frequency Min 2X/week   Barriers to discharge        Co-evaluation               End of Session Equipment Utilized During Treatment: Gait belt;Oxygen;Other (comment) (2L/min  ) Activity Tolerance: Patient tolerated treatment well Patient left: in bed;with call bell/phone within reach;with family/visitor present   PT Visit Diagnosis: Muscle weakness (generalized) (M62.81);Difficulty in walking, not elsewhere classified (R26.2)         Time: 1050-1110 PT Time Calculation (min) (ACUTE ONLY): 20 min   Charges:   PT Evaluation $PT Eval Low Complexity: 1 Procedure     PT G Codes:        Lyndel Safe Avyn Coate PT, DPT   Taralyn Ferraiolo 09/13/2016, 11:46 AM

## 2016-09-13 NOTE — Telephone Encounter (Signed)
Hospital calling stating pt needs a 1w fu  Please advise

## 2016-09-13 NOTE — Progress Notes (Signed)
Shift assessment completed at 0740. Pt awake, alert and oriented, attempting to order breakfast. o2 on at [facility], pt stated she wears this at home, and lungs with coarse wheezes throughout. telebox on with St noted. Abdomen is soft, bs heard. Pt is oob to bathroom to void prn.ppp, no edema noted. PIVto bilat arms are intact and free of redness and swelling. Since assessment, pt has voiced to Dr Earleen Newport that she wanted to go home. Dr. Discussed this with pt and her husband, who said her felt he could assist her at home at this point. Physical therapy eval was completed, and case mgt set up home pt for pt as well. This writer removed piv's from bilat arms with catheters intact, pt tolerated well. Pt was assisted with dressing, and pt and her husband received d/c instructions and scripts. Husband left and brought pt's O2 tank from home for d/c. Pt left the unit at this time via wc to front entrance of facility and waiting family vehicle. Pt sob with movement but stated that this was her baseline.

## 2016-09-13 NOTE — Discharge Summary (Signed)
[name] NAME: Anna Mcbride    MR#:  [id number]  DATE OF BIRTH:  05/02/1946  DATE OF ADMISSION:  09/11/2016 ADMITTING PHYSICIAN: Harrie Foreman, MD  DATE OF DISCHARGE: 09/13/2016  PRIMARY CARE PHYSICIAN: Madelyn Brunner, MD    ADMISSION DIAGNOSIS:  Sepsis, due to unspecified organism ([place]) [A41.9]  DISCHARGE DIAGNOSIS:  Radiation pneumonitis in the lungs  SECONDARY DIAGNOSIS:   Past Medical History:  Diagnosis Date  . Acute respiratory failure ([place]) 11/29/2014  . 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 Asthma   . Non Hodgkin's lymphoma ([place])     HOSPITAL COURSE:   1. Acute on chronic respiratory failure. I think this is a radiation pneumonitis. Chest x-ray negative for pneumonia. Rocephin and Zithromax given in the hospital and switched over to Ceftin and Zithromax upon discharge home. Pro-calcitonin is negative. MRSA PCR is negative. I prescribed budesonide nebulizers for home. Continue DuoNeb nebulizers. The patient stated that she wants to go home today on the day of discharge. Patient feeling better. Follow-up with pulmonary. Prednisone taper prescribed. The patient was supposed to have an outpatient bronchoscopy which was canceled secondary to her clinical condition in the hospital. This will have to be rescheduled. 2. Acute kidney injury and dehydration with hypotension. The patient improved with IV fluid hydration. 3. Hypokalemia. Potassium replaced during hospital course. 4. History of non-Hodgkin's lymphoma status post radiation.  5. Weakness. Physical therapy evaluation appreciated. Resume home health. 6. Patient is DO NOT RESUSCITATE  Sepsis ruled out   DISCHARGE CONDITIONS:   Fair  CONSULTS OBTAINED:  Pulmonary  DRUG ALLERGIES:   Allergies  Allergen Reactions  . Hydrocodone-Homatropine Itching  . Tussionex Pennkinetic Er [Hydrocod Polst-Cpm Polst Er] Itching  . Ciprofloxacin Rash    DISCHARGE  MEDICATIONS:   Current Discharge Medication List    START taking these medications   Details  azithromycin (ZITHROMAX) 250 MG tablet One tab po daily for three days Qty: 3 each, Refills: 0    budesonide (PULMICORT) 0.5 MG/2ML nebulizer solution Take 2 mLs (0.5 mg total) by nebulization 2 (two) times daily. Qty: 120 mL, Refills: 0    cefUROXime (CEFTIN) 500 MG tablet Take 1 tablet (500 mg total) by mouth 2 (two) times daily with a meal. Qty: 10 tablet, Refills: 0    nystatin (MYCOSTATIN) 100000 UNIT/ML suspension Take 5 mLs (500,000 Units total) by mouth 4 (four) times daily. Qty: 120 mL, Refills: 0    predniSONE (DELTASONE) 5 MG tablet 8 tabs po day1; 6 tabs po day2; 5 tabs po day3; 4 tabs po day4; 3 tabs po day5; 2 tabs po day6; 1 tab po day7 Qty: 29 tablet, Refills: 0      CONTINUE these medications which have NOT CHANGED   Details  albuterol (PROVENTIL HFA;VENTOLIN HFA) 108 (90 BASE) MCG/ACT inhaler Inhale 1 puff into the lungs every 6 (six) hours as needed for wheezing or shortness of breath.    albuterol (PROVENTIL) (2.5 MG/3ML) 0.083% nebulizer solution Take 3 mLs (2.5 mg total) by nebulization every 4 (four) hours as needed for wheezing or shortness of breath. Qty: 75 mL, Refills: 12    AMBULATORY NON FORMULARY MEDICATION Medication Name: Incentive Spirometer Use 10-15 times daily Qty: 1 each, Refills: 0    aspirin EC 81 MG tablet Take 81 mg by mouth daily.    citalopram (CELEXA) 20 MG tablet TAKE 1 TABLET BY MOUTH ONCE DAILY Qty: 90 tablet, Refills: 3  dextromethorphan-guaiFENesin (MUCINEX DM) 30-600 MG 12hr tablet Take 1 tablet by mouth daily.    levothyroxine (SYNTHROID, LEVOTHROID) 25 MCG tablet TAKE 1 TABLET BY MOUTH ONCE DAILY ON AN EMPTY STOMACH. WAIT 30 MINUTES BEFORE TAKING OTHER MEDS. Qty: 30 tablet, Refills: 2    omeprazole (PRILOSEC) 20 MG capsule Take 1 capsule (20 mg total) by mouth 2 (two) times daily before a meal. Qty: 60 capsule, Refills: 3    Associated Diagnoses: Non-Hodgkin's lymphoma, unspecified body region, unspecified non-Hodgkin lymphoma type (HCC)    ondansetron (ZOFRAN) 4 MG tablet TAKE 1 TABLET BY MOUTH EVERY 8 HOURS AS NEEDED NAUSEA AND VOMITING Qty: 30 tablet, Refills: 1   Associated Diagnoses: Non-Hodgkin's lymphoma, unspecified body region, unspecified non-Hodgkin lymphoma type (HCC)    potassium chloride SA (K-DUR,KLOR-CON) 20 MEQ tablet TAKE 1 TABLET BY MOUTH TWICE DAILY Qty: 30 tablet, Refills: 1    Respiratory Therapy Supplies (FLUTTER) DEVI Use 10-15 times daily Qty: 1 each, Refills: 0    umeclidinium bromide (INCRUSE ELLIPTA) 62.5 MCG/INH AEPB Inhale 1 puff into the lungs daily. Qty: 30 each, Refills: 5    vitamin C (ASCORBIC ACID) 500 MG tablet Take 500 mg by mouth daily.      STOP taking these medications     fluticasone-salmeterol (ADVAIR HFA) 115-21 MCG/ACT inhaler          DISCHARGE INSTRUCTIONS:   Follow-up with PMD or pulmonary within 1 week  If you experience worsening of your admission symptoms, develop shortness of breath, life threatening emergency, suicidal or homicidal thoughts you must seek medical attention immediately by calling 911 or calling your MD immediately  if symptoms less severe.  You Must read complete instructions/literature along with all the possible adverse reactions/side effects for all the Medicines you take and that have been prescribed to you. Take any new Medicines after you have completely understood and accept all the possible adverse reactions/side effects.   Please note  You were cared for by a hospitalist during your hospital stay. If you have any questions about your discharge medications or the care you received while you were in the hospital after you are discharged, you can call the unit and asked to speak with the hospitalist on call if the hospitalist that took care of you is not available. Once you are discharged, your primary care physician will handle  any further medical issues. Please note that NO REFILLS for any discharge medications will be authorized once you are discharged, as it is imperative that you return to your primary care physician (or establish a relationship with a primary care physician if you do not have one) for your aftercare needs so that they can reassess your need for medications and monitor your lab values.    Today   CHIEF COMPLAINT:   Chief Complaint  Patient presents with  . Altered Mental Status  . Cough  . Shortness of Breath  . Weakness    HISTORY OF PRESENT ILLNESS:  Sharetta Halleran  is a 71 y.o. female brought in with cough shortness of breath   VITAL SIGNS:  Blood pressure 104/63, pulse (!) 107, temperature 97.6 F (36.4 C), temperature source Oral, resp. rate 16, height 5\' 1"  (1.549 m), weight 59.6 kg (131 lb 4.8 oz), SpO2 98 %.   PHYSICAL EXAMINATION:  GENERAL:  71 y.o.-year-old patient lying in the bed with no acute distress.  EYES: Pupils equal, round, reactive to light and accommodation. No scleral icterus. Extraocular muscles intact.  HEENT: Head atraumatic, normocephalic.  Oropharynx and nasopharynx clear.  NECK:  Supple, no jugular venous distention. No thyroid enlargement, no tenderness.  LUNGS:  decreased breath sounds bilaterally, slight expiratory Wheezing. No  rales,rhonchi or crepitation. No use of accessory muscles of respiration.  CARDIOVASCULAR: S1, S2 normal. No murmurs, rubs, or gallops.  ABDOMEN: Soft, non-tender, non-distended. Bowel sounds present. No organomegaly or mass.  EXTREMITIES: No pedal edema, cyanosis, or clubbing.  NEUROLOGIC: Cranial nerves II through XII are intact. Muscle strength 5/5 in all extremities. Sensation intact. Gait not checked.  PSYCHIATRIC: The patient is alert and oriented x 3.  SKIN: No obvious rash, lesion, or ulcer.   DATA REVIEW:   CBC  Recent Labs Lab 09/11/16 2232  WBC 7.7  HGB 13.5  HCT 40.3  PLT 153    Chemistries   Recent  Labs Lab 09/11/16 2232  NA 138  K 3.2*  CL 103  CO2 27  GLUCOSE 106*  BUN 27*  CREATININE 1.10*  CALCIUM 8.8*  AST 20  ALT 22  ALKPHOS 67  BILITOT 0.8    Cardiac Enzymes  Recent Labs Lab 09/11/16 2232  TROPONINI <0.03    Microbiology Results  Results for orders placed or performed during the hospital encounter of 09/11/16  Blood Culture (routine x 2)     Status: None (Preliminary result)   Collection Time: 09/11/16 10:32 PM  Result Value Ref Range Status   Specimen Description BLOOD LEFT ANTECUBITAL  Final   Special Requests BOTTLES DRAWN AEROBIC AND ANAEROBIC  [place]  Final   Culture NO GROWTH 2 DAYS  Final   Report Status PENDING  Incomplete  Blood Culture (routine x 2)     Status: None (Preliminary result)   Collection Time: 09/11/16 10:32 PM  Result Value Ref Range Status   Specimen Description BLOOD RIGHT WRIST  Final   Special Requests BOTTLES DRAWN AEROBIC AND ANAEROBIC  BCAV  Final   Culture NO GROWTH 2 DAYS  Final   Report Status PENDING  Incomplete  Urine culture     Status: None   Collection Time: 09/12/16  1:10 AM  Result Value Ref Range Status   Specimen Description URINE, RANDOM  Final   Special Requests NONE  Final   Culture   Final    NO GROWTH Performed at [facility], [place] [address] [place], [place] [address]    Report Status 09/13/2016 FINAL  Final  MRSA PCR Screening     Status: None   Collection Time: 09/12/16  8:22 AM  Result Value Ref Range Status   MRSA by PCR NEGATIVE NEGATIVE Final    Comment:        The GeneXpert MRSA Assay (FDA approved for NASAL specimens only), is one component of a comprehensive MRSA colonization surveillance program. It is not intended to diagnose MRSA infection nor to guide or monitor treatment for MRSA infections.     RADIOLOGY:  Dg Chest Port 1 View  Result Date: 09/11/2016 CLINICAL DATA:  71 year old female with shortness of breath and sepsis. History of non-Hodgkin's lymphoma and prior  radiation. EXAM: PORTABLE CHEST 1 VIEW COMPARISON:  Chest radiograph dated 09/11/2016 and CT dated 08/30/2015 FINDINGS: Left pectoral Port-A-Cath with tip over central SVC is in stable positioning. Postradiation changes with pleuroparenchymal thickening and scarring involving the right apex and bilateral suprahilar region appears similar the prior radiograph. There is associated mild traction bronchiectasis with mild deviation of the trachea to the right. Stable appearing right lung base densities in likely combination of  a small possibly loculated pleural effusion and pleural thickening/ scarring. No new lung opacity. There is no pneumothorax. Stable cardiac silhouette. No acute osseous pathology. IMPRESSION: 1. No interval change.  No new pulmonary opacity. 2. Postradiation changes with pleuroparenchymal fibrosis and scarring predominantly in the right apex and suprahilar region. 3. Stable right lung base densities likely combination of small loculated pleural fluid and pleural thickening/scarring. Electronically Signed   By: Anner Crete M.D.   On: 09/11/2016 23:03    Management plans discussed with the patient, family and they Told me that they are going home today.  CODE STATUS:     Code Status Orders        Start     Ordered   09/12/16 0224  Do not attempt resuscitation (DNR)  Continuous    Question Answer Comment  In the event of cardiac or respiratory ARREST Do not call a "code blue"   In the event of cardiac or respiratory ARREST Do not perform Intubation, CPR, defibrillation or ACLS   In the event of cardiac or respiratory ARREST Use medication by any route, position, wound care, and other measures to relive pain and suffering. May use oxygen, suction and manual treatment of airway obstruction as needed for comfort.      09/12/16 0223    Code Status History    Date Active Date Inactive Code Status Order ID Comments User Context   06/18/2016  6:21 PM 06/20/2016  3:35 PM DNR  [id number]  Vaughan Basta, MD Inpatient   11/29/2014  2:05 PM 12/05/2014  3:50 PM DNR [id number]  Evlyn Kanner, NP Inpatient    Advance Directive Documentation   Flowsheet Row Most Recent Value  Type of Advance Directive  Healthcare Power of Attorney, Living will  Pre-existing out of facility DNR order (yellow form or pink MOST form)  No data  "MOST" Form in Place?  No data      TOTAL TIME TAKING CARE OF THIS PATIENT: 35 minutes.    Loletha Grayer M.D on 09/13/2016 at 2:43 PM  Between 7am to 6pm - Pager - [phone]  After 6pm go to [email or web address] - password [email or web address]  [facility] Office  [phone]  CC: Primary care physician; Madelyn Brunner, MD

## 2016-09-13 NOTE — Care Management (Signed)
Spoke with spouse. Discussed PT recommendations. He states that he already has a physical therapist set up to come out on [place] and begin to work with his wife. They declined a home health nurse. No other needs identified. Case Closed. Primary nurse updated. [name]

## 2016-09-13 NOTE — Telephone Encounter (Signed)
Please schedule pt for 09/23/16 @ 12pm with Kasa. Thanks

## 2016-09-13 NOTE — Care Management Important Message (Signed)
Important Message  Patient Details  Name: Anna Mcbride MRN: [id number] Date of Birth: 1945/07/26   Medicare Important Message Given:  Yes    Jolly Mango, RN 09/13/2016, 10:02 AM

## 2016-09-13 NOTE — Consult Note (Signed)
   Name: Anna Mcbride MRN: [id number] DOB: Feb 18, 1946    ADMISSION DATE:  09/11/2016 CONSULTATION DATE: 09/12/16  REFERRING MD :  weighting  CHIEF COMPLAINT:  SOB  BRIEF PATIENT DESCRIPTION:end stage resp failure with hypoxia Radiation fibrosis h/o pneumonia  Patient of mine: h/o Radiation induced fibrosis with recurrent infections  HISTORY OF PRESENT ILLNESS:   Wheezing improving slowly Plan to dc home today SOB improved Still with cough    REVIEW OF SYSTEMS:   Constitutional: Negative for fever, chills, weight loss,+ malaise/fatigue and +diaphoresis.  HENT: Negative for hearing loss, ear pain, nosebleeds, congestion, sore throat, neck pain, tinnitus and ear discharge.   Eyes: Negative for blurred vision, double vision, photophobia, pain, discharge and redness.  Respiratory: + cough,- hemoptysis,- sputum production, +shortness of breath,+ wheezing and -stridor.   Cardiovascular: Negative for chest pain, palpitations, orthopnea, claudication, leg swelling and PND.     VITAL SIGNS: Temp:  [97.5 F (36.4 C)-98.9 F (37.2 C)] 97.6 F (36.4 C) (02/23 1139) Pulse Rate:  [93-107] 107 (02/23 1139) Resp:  [16] 16 (02/23 1139) BP: (104-125)/(63-73) 104/63 (02/23 1139) SpO2:  [98 %-99 %] 98 % (02/23 1139)  Physical Examination:   GENERAL+weakness and + fatigue HEAD: Normocephalic, atraumatic.  EYES: Pupils equal, round, reactive to light. Extraocular muscles intact. No scleral icterus.  MOUTH: Moist mucosal membrane. Dentition intact. No abscess noted.  EAR, NOSE, THROAT: Clear without exudates. No external lesions.  NECK: Supple. No thyromegaly. No nodules. No JVD.  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 All questions answered  [name], M.D.  [facility]  Medical Director [place] Director [facility] Cardio-Pulmonary Department

## 2016-09-16 LAB — CULTURE, BLOOD (ROUTINE X 2)
CULTURE: NO GROWTH
Culture: NO GROWTH

## 2016-09-21 DIAGNOSIS — J3089 Other allergic rhinitis: Principal | ICD-10-CM

## 2016-09-21 DIAGNOSIS — R32 Unspecified urinary incontinence: Secondary | ICD-10-CM

## 2016-09-21 DIAGNOSIS — E785 Hyperlipidemia, unspecified: Secondary | ICD-10-CM

## 2016-09-21 DIAGNOSIS — I10 Essential (primary) hypertension: Secondary | ICD-10-CM

## 2016-09-21 DIAGNOSIS — M791 Myalgia, unspecified site: Secondary | ICD-10-CM

## 2016-09-21 DIAGNOSIS — K210 Gastro-esophageal reflux disease with esophagitis: Secondary | ICD-10-CM

## 2016-09-22 MED ORDER — TORSEMIDE 20 MG PO TABS
0 refills | Status: CP
Start: 2016-09-22 — End: 2016-11-22

## 2016-09-22 MED ORDER — OXYBUTYNIN CHLORIDE 5 MG PO TABS
2 refills | Status: CP
Start: 2016-09-22 — End: 2016-11-22

## 2016-09-22 MED ORDER — TIZANIDINE HCL 2 MG PO TABS
0 refills | Status: CP
Start: 2016-09-22 — End: 2016-10-07

## 2016-09-22 MED ORDER — RANITIDINE HCL 150 MG PO TABS
0 refills | Status: CP
Start: 2016-09-22 — End: 2016-11-22

## 2016-09-22 MED ORDER — DONEPEZIL HCL 5 MG PO TABS
0 refills | Status: CP
Start: 2016-09-22 — End: 2016-11-22

## 2016-09-22 MED ORDER — FLUTICASONE PROPIONATE 50 MCG/ACT NA SUSP
1 refills | Status: CP
Start: 2016-09-22 — End: 2017-03-31

## 2016-09-22 MED ORDER — PRAVASTATIN SODIUM 40 MG PO TABS
0 refills | Status: CP
Start: 2016-09-22 — End: 2016-11-22

## 2016-09-22 MED ORDER — HYDROCHLOROTHIAZIDE 25 MG PO TABS
0 refills | Status: CP
Start: 2016-09-22 — End: 2016-11-22

## 2016-09-23 ENCOUNTER — Ambulatory Visit (INDEPENDENT_AMBULATORY_CARE_PROVIDER_SITE_OTHER): Payer: Medicare Other | Admitting: Internal Medicine

## 2016-09-23 ENCOUNTER — Encounter: Payer: Self-pay | Admitting: Internal Medicine

## 2016-09-23 VITALS — BP 114/62 | HR 96 | Wt 120.0 lb

## 2016-09-23 DIAGNOSIS — J9611 Chronic respiratory failure with hypoxia: Secondary | ICD-10-CM

## 2016-09-23 MED ORDER — GUAIFENESIN-CODEINE 100-10 MG/5ML PO SOLN: 5.0000 mL | ORAL | 0 refills | Status: DC | PRN

## 2016-09-23 NOTE — Patient Instructions (Signed)
Continue prednisone 20 mg daily Cough syrup as needed
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
## 2016-09-24 ENCOUNTER — Telehealth: Payer: Self-pay

## 2016-09-24 MED ORDER — ALPRAZOLAM 0.5 MG PO TABS: 0.5000 mg | ORAL_TABLET | Freq: Every evening | ORAL | 1 refills | Status: DC | PRN

## 2016-09-24 NOTE — Telephone Encounter (Signed)
Per DK, give pt Alprazolam 0.5mg  disp: 20 with 1 refill. Called into [facility]. Husband informed. Nothing further needed.

## 2016-09-24 NOTE — Telephone Encounter (Signed)
Husband has called and states you told pt you would give her something to help her sleep. They are stating you said Xanax. Nothing is in the OV note. Please advise.

## 2016-09-24 NOTE — Telephone Encounter (Signed)
Pt husband is calling, states pt needs refill on Xanax sent to [facility]

## 2016-09-30 ENCOUNTER — Other Ambulatory Visit: Payer: Self-pay | Admitting: *Deleted

## 2016-09-30 ENCOUNTER — Encounter: Attending: Internal Medicine | Primary: Internal Medicine

## 2016-09-30 MED ORDER — FLUTICASONE-SALMETEROL 250-50 MCG/DOSE IN AEPB: 1.0000 | INHALATION_SPRAY | Freq: Two times a day (BID) | RESPIRATORY_TRACT | 3 refills | Status: DC

## 2016-10-03 ENCOUNTER — Inpatient Hospital Stay: Payer: Medicare Other | Attending: Internal Medicine

## 2016-10-03 ENCOUNTER — Inpatient Hospital Stay: Payer: Medicare Other

## 2016-10-03 DIAGNOSIS — Z23 Encounter for immunization: Secondary | ICD-10-CM | POA: Diagnosis present

## 2016-10-03 DIAGNOSIS — C8331 Diffuse large B-cell lymphoma, lymph nodes of head, face, and neck: Secondary | ICD-10-CM

## 2016-10-03 MED ORDER — PNEUMOCOCCAL VAC POLYVALENT 25 MCG/0.5ML IJ INJ
0.5000 mL | INJECTION | Freq: Once | INTRAMUSCULAR | Status: AC
  Administered 2016-10-03: 0.5 mL via INTRAMUSCULAR
  Filled 2016-10-03: qty 0.5

## 2016-10-05 DIAGNOSIS — M791 Myalgia, unspecified site: Principal | ICD-10-CM

## 2016-10-07 MED ORDER — TIZANIDINE HCL 2 MG PO TABS
0 refills | Status: CP
Start: 2016-10-07 — End: 2017-05-07

## 2016-10-08 ENCOUNTER — Ambulatory Visit
Admission: RE | Admit: 2016-10-08 | Discharge: 2016-10-08 | Disposition: A | Discharge status: Home / Self Care | Payer: Medicare Other | Source: Ambulatory Visit | Attending: Internal Medicine | Admitting: Internal Medicine

## 2016-10-08 DIAGNOSIS — Z1231 Encounter for screening mammogram for malignant neoplasm of breast: Secondary | ICD-10-CM | POA: Insufficient documentation

## 2016-10-08 HISTORY — DX: Personal history of antineoplastic chemotherapy: Z92.21

## 2016-10-21 ENCOUNTER — Telehealth: Payer: Self-pay | Admitting: Internal Medicine

## 2016-10-21 MED ORDER — AZITHROMYCIN 250 MG PO TABS: ORAL_TABLET | ORAL | 0 refills | Status: AC

## 2016-10-21 NOTE — Telephone Encounter (Signed)
Error

## 2016-10-21 NOTE — Telephone Encounter (Signed)
Can send in script for Z-pack.

## 2016-10-21 NOTE — Telephone Encounter (Signed)
DK pt. Please advise on message below.

## 2016-10-21 NOTE — Telephone Encounter (Signed)
Husband informed ZPak sent to pharmacy. Nothing further needed.

## 2016-10-21 NOTE — Telephone Encounter (Signed)
Patient husband calling.  She has what he thinks is the beginning of a cold .  Patient c/o sore throat ear ache and fever.  Patient husband is scared to wait and see what happens as last episode ended in hospital admission.  Please call.

## 2016-10-23 ENCOUNTER — Telehealth: Payer: Self-pay

## 2016-10-23 MED ORDER — PREDNISONE 20 MG PO TABS: 40.0000 mg | ORAL_TABLET | Freq: Every day | ORAL | 0 refills | Status: AC

## 2016-10-23 NOTE — Telephone Encounter (Signed)
Her medication list indicates that she is on prednisone 20 mg daily. If this is true, have her double too 40 mg daily for 5 days, then back to 20 mg daily. If no longer on it, I have placed order for prednisone 40 mg daily X 5. Please make sure she has follow up with DK arranged and she should have CXR prior to that visit  Anna Mcbride

## 2016-10-23 NOTE — Telephone Encounter (Signed)
LMOM for husband to return call.

## 2016-10-23 NOTE — Telephone Encounter (Signed)
Pt husband called, states pt still just does not feel good. He asks if pt can have Prednisone called in. Please advise.

## 2016-10-23 NOTE — Telephone Encounter (Signed)
DR had me to send in [place] on 10/21/16 for this pt. They are asking if they can get Prednisone now. stating pt is no better. Please advise.

## 2016-10-23 NOTE — Telephone Encounter (Signed)
Husband states pt is not on Prednisone at this time. Informed an RX has been sent to pharmacy for Prednisone 40mg  daily x 5 days. Informed pt needs to be scheduled with CXR prior. Husband states they will call back to schedule.

## 2016-10-31 ENCOUNTER — Encounter: Attending: Internal Medicine | Primary: Internal Medicine

## 2016-11-05 DIAGNOSIS — I10 Essential (primary) hypertension: Principal | ICD-10-CM

## 2016-11-05 DIAGNOSIS — J449 Chronic obstructive pulmonary disease, unspecified: Secondary | ICD-10-CM

## 2016-11-06 ENCOUNTER — Other Ambulatory Visit: Payer: Self-pay | Admitting: Unknown Physician Specialty

## 2016-11-06 ENCOUNTER — Telehealth: Payer: Self-pay | Admitting: Internal Medicine

## 2016-11-06 ENCOUNTER — Ambulatory Visit
Admission: RE | Admit: 2016-11-06 | Discharge: 2016-11-06 | Disposition: A | Discharge status: Home / Self Care | Payer: Medicare Other | Source: Ambulatory Visit | Attending: Unknown Physician Specialty | Admitting: Unknown Physician Specialty

## 2016-11-06 ENCOUNTER — Encounter: Attending: Internal Medicine | Primary: Internal Medicine

## 2016-11-06 DIAGNOSIS — R509 Fever, unspecified: Secondary | ICD-10-CM | POA: Diagnosis not present

## 2016-11-06 DIAGNOSIS — R059 Cough, unspecified: Secondary | ICD-10-CM

## 2016-11-06 DIAGNOSIS — R05 Cough: Secondary | ICD-10-CM | POA: Insufficient documentation

## 2016-11-06 DIAGNOSIS — R0989 Other specified symptoms and signs involving the circulatory and respiratory systems: Secondary | ICD-10-CM | POA: Diagnosis not present

## 2016-11-06 DIAGNOSIS — Z923 Personal history of irradiation: Secondary | ICD-10-CM | POA: Diagnosis not present

## 2016-11-06 MED ORDER — PROAIR HFA 108 (90 BASE) MCG/ACT IN AERS
0 refills | Status: CP
Start: 2016-11-06 — End: 2016-12-10

## 2016-11-06 MED ORDER — LISINOPRIL 10 MG PO TABS
0 refills | Status: CP
Start: 2016-11-06 — End: 2016-11-22

## 2016-11-06 NOTE — Telephone Encounter (Signed)
Dr Ileene Hutchinson office crissy calling stating pt Dr Tami Ribas is stating pt needs to be seen by [place] soon, for her lungs are "really junkie" and she is running a fever  would like some advise on this

## 2016-11-06 NOTE — Telephone Encounter (Signed)
Pt spouse calling stating for about a week and half Pt has a little fever along with a rattling in her chest.  She also may have something in her ears, pt is complaining of them hurting, and one of them she really can't hear out of . He was not sure if he needed to call us first or Dr Ileene Hutchinson office Please advise

## 2016-11-06 NOTE — Telephone Encounter (Signed)
Spoke with husband and informed him to contact [place] or either PCP since her symptoms are more around her ears and to call back if they need [place] further. Husband verbalized understanding. Nothing further needed.

## 2016-11-06 NOTE — Telephone Encounter (Signed)
Spoke with husband and gave an appt for 11/07/16 @ 9:45am with DK. Nothing further needed.

## 2016-11-07 ENCOUNTER — Ambulatory Visit (INDEPENDENT_AMBULATORY_CARE_PROVIDER_SITE_OTHER): Payer: Medicare Other | Admitting: Internal Medicine

## 2016-11-07 ENCOUNTER — Encounter: Payer: Self-pay | Admitting: Internal Medicine

## 2016-11-07 VITALS — BP 112/66 | HR 98 | Temp 97.8°F | Wt 123.0 lb

## 2016-11-07 DIAGNOSIS — J9611 Chronic respiratory failure with hypoxia: Secondary | ICD-10-CM

## 2016-11-07 MED ORDER — ONDANSETRON HCL 4 MG PO TABS: 4.0000 mg | ORAL_TABLET | Freq: Four times a day (QID) | ORAL | 2 refills | Status: DC | PRN

## 2016-11-07 MED ORDER — FORMOTEROL FUMARATE 20 MCG/2ML IN NEBU: 20.0000 ug | INHALATION_SOLUTION | Freq: Two times a day (BID) | RESPIRATORY_TRACT | 11 refills | Status: DC

## 2016-11-07 MED ORDER — AMOXICILLIN-POT CLAVULANATE 875-125 MG PO TABS: 1.0000 | ORAL_TABLET | Freq: Two times a day (BID) | ORAL | 0 refills | Status: DC

## 2016-11-07 MED ORDER — PREDNISONE 20 MG PO TABS
ORAL_TABLET | ORAL | 5 refills | Status: DC
Start: 2016-11-07 — End: 2016-11-29

## 2016-11-07 MED ORDER — BUDESONIDE 0.5 MG/2ML IN SUSP
0.5000 mg | Freq: Two times a day (BID) | RESPIRATORY_TRACT | 11 refills | Status: DC
Start: 2016-11-07 — End: 2017-01-06
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
## 2016-11-07 NOTE — Patient Instructions (Addendum)
1. albuterol nebs every 4 hrs as needed 2.robitussin with codeine as needed 3.continue oxygen as needed 4.stop Incruse and STOP Advair 5.incentive spirometry 6.flutter valve 7. Start prednisone 40 mg daily for 10 days and then 20 mg daily until next visit as pre patient request 8.start Pulmicort nebs BID and Peformist nebs BID daily 9.start Augmentin 875 mg PO BID 10. Zofran as needed for nausea

## 2016-11-08 ENCOUNTER — Other Ambulatory Visit: Payer: Self-pay | Admitting: Internal Medicine

## 2016-11-15 ENCOUNTER — Emergency Department: Payer: Medicare Other

## 2016-11-15 ENCOUNTER — Emergency Department
Admission: EM | Admit: 2016-11-15 | Discharge: 2016-11-16 | Disposition: A | Discharge status: Home / Self Care | Payer: Medicare Other | Attending: Emergency Medicine | Admitting: Emergency Medicine

## 2016-11-15 ENCOUNTER — Other Ambulatory Visit: Payer: Medicare Other

## 2016-11-15 DIAGNOSIS — E869 Volume depletion, unspecified: Secondary | ICD-10-CM

## 2016-11-15 DIAGNOSIS — J45909 Unspecified asthma, uncomplicated: Secondary | ICD-10-CM | POA: Insufficient documentation

## 2016-11-15 DIAGNOSIS — Z7982 Long term (current) use of aspirin: Secondary | ICD-10-CM | POA: Insufficient documentation

## 2016-11-15 DIAGNOSIS — R509 Fever, unspecified: Secondary | ICD-10-CM | POA: Diagnosis present

## 2016-11-15 DIAGNOSIS — R112 Nausea with vomiting, unspecified: Secondary | ICD-10-CM

## 2016-11-15 DIAGNOSIS — E876 Hypokalemia: Secondary | ICD-10-CM | POA: Diagnosis not present

## 2016-11-15 DIAGNOSIS — E039 Hypothyroidism, unspecified: Secondary | ICD-10-CM | POA: Insufficient documentation

## 2016-11-15 DIAGNOSIS — R531 Weakness: Secondary | ICD-10-CM

## 2016-11-15 LAB — BASIC METABOLIC PANEL
Anion gap: 7 (ref 5–15)
BUN: 27 mg/dL — ABNORMAL HIGH (ref 6–20)
CHLORIDE: 104 mmol/L (ref 101–111)
CO2: 26 mmol/L (ref 22–32)
CREATININE: 1.25 mg/dL — AB (ref 0.44–1.00)
Calcium: 9 mg/dL (ref 8.9–10.3)
GFR calc Af Amer: 49 mL/min — ABNORMAL LOW (ref >60)
GFR calc non Af Amer: 42 mL/min — ABNORMAL LOW (ref >60)
Glucose, Bld: 117 mg/dL — ABNORMAL HIGH (ref 65–99)
POTASSIUM: 3.2 mmol/L — AB (ref 3.5–5.1)
Sodium: 137 mmol/L (ref 135–145)

## 2016-11-15 LAB — HEPATIC FUNCTION PANEL
ALK PHOS: 49 U/L (ref 38–126)
ALT: 18 U/L (ref 14–54)
AST: 21 U/L (ref 15–41)
Albumin: 3.3 g/dL — ABNORMAL LOW (ref 3.5–5.0)
BILIRUBIN TOTAL: 0.4 mg/dL (ref 0.3–1.2)
Total Protein: 6.2 g/dL — ABNORMAL LOW (ref 6.5–8.1)

## 2016-11-15 LAB — CBC
HEMATOCRIT: 37.6 % (ref 35.0–47.0)
Hemoglobin: 12.8 g/dL (ref 12.0–16.0)
MCH: 34 pg (ref 26.0–34.0)
MCHC: 34.2 g/dL (ref 32.0–36.0)
MCV: 99.4 fL (ref 80.0–100.0)
PLATELETS: 148 10*3/uL — AB (ref 150–440)
RBC: 3.78 MIL/uL — ABNORMAL LOW (ref 3.80–5.20)
RDW: 16 % — AB (ref 11.5–14.5)
WBC: 8 10*3/uL (ref 3.6–11.0)

## 2016-11-15 LAB — TROPONIN I: Troponin I: <0.03 ng/mL (ref <0.03)

## 2016-11-15 LAB — LIPASE, BLOOD: LIPASE: 37 U/L (ref 11–51)

## 2016-11-15 MED ORDER — SODIUM CHLORIDE 0.9 % IV BOLUS (SEPSIS)
1000.0000 mL | INTRAVENOUS | Status: AC
  Administered 2016-11-15: 1000 mL via INTRAVENOUS

## 2016-11-15 NOTE — ED Triage Notes (Signed)
Pt presents to ED with c/o weakness and emesis that started today. Pt's husband reports 2-3 episodes of emesis; reports pt has history of similar s/x's d/t "dehydration". Pt deneis any c/o pain at this time. EDT informed this RN that the pt had needed a lot of help to get out of the car d/t weakness. 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

## 2016-11-15 NOTE — ED Notes (Signed)
Patient transported to X-ray 

## 2016-11-15 NOTE — ED Notes (Signed)
Pt's spouse updated that they currently have 2 people ahead of them to come back. Spouse was reassured that we would get them back as soon as possible.

## 2016-11-15 NOTE — ED Notes (Signed)
Pt and husband state diarrhea and nausea today. Husband states fever of 102 at home. Pt appears weak. Pt denies vomiting or pain. Pt alert and responsive x4, but appears weak. Skin normal color, warm, not hot and dry. Pt with cap refill of less than 4 seconds. Pt denies dysuria.

## 2016-11-15 NOTE — ED Notes (Signed)
Pink , no iv sleeve, applied to left arm.

## 2016-11-16 LAB — URINALYSIS, COMPLETE (UACMP) WITH MICROSCOPIC
BACTERIA UA: NONE SEEN
BILIRUBIN URINE: NEGATIVE
Glucose, UA: 50 mg/dL — AB
HGB URINE DIPSTICK: NEGATIVE
KETONES UR: NEGATIVE mg/dL
LEUKOCYTES UA: NEGATIVE
Nitrite: NEGATIVE
PH: 7 (ref 5.0–8.0)
Protein, ur: 30 mg/dL — AB
Specific Gravity, Urine: 1.016 (ref 1.005–1.030)

## 2016-11-16 LAB — LACTIC ACID, PLASMA: LACTIC ACID, VENOUS: 1.1 mmol/L (ref 0.5–1.9)

## 2016-11-16 MED ORDER — POTASSIUM CHLORIDE CRYS ER 20 MEQ PO TBCR
40.0000 meq | EXTENDED_RELEASE_TABLET | Freq: Once | ORAL | Status: AC
  Administered 2016-11-16: 40 meq via ORAL
  Filled 2016-11-16: qty 2

## 2016-11-16 NOTE — ED Notes (Signed)
Pt sleeping, resps unlabored.  

## 2016-11-16 NOTE — ED Provider Notes (Signed)
[facility] Emergency Department Provider Note  ____________________________________________   First MD Initiated Contact with Patient 11/15/16 2351     (approximate)  I have reviewed the triage vital signs and the nursing notes.   HISTORY  Chief Complaint Weakness and Emesis    HPI Anna Mcbride is a 71 y.o. female with a medical history as listed below but most notable for chronic lung disease with full-time home oxygen of 2 L binasal cannula and who goes to Dr. Mortimer Fries for pulmonology care.  She presents by private vehicle for evaluation of generalized weakness, lethargy, and possible dehydration.  Her husband reports that they have out-of-town family coming in today so the patient has been working in the house today, a little bit more active than usual, drinking some fluids but not as much as usual.  By the afternoon she was feeling weak and like she should lie down for a while which he reports is not unusual for her.  He left run some errands and when he got back she was lying on the couch, "lethargic" and, in his words, almost unresponsive.  She was quite weak when they arrived at the emergency department and required assistance getting her out of the car.  She is alert and oriented but does appear lethargic and is slow to respond but is clearly following our conversation because she will contribute from time to time or if her husband gives an incorrect answer or response.  She denies any pain specifically including chest and abdominal pain.  She reports nausea and did have 2-3 episodes of emesis at home as well as possibly some loose stools.  She denies any more difficulty breathing than usual.  She did not know she had a fever until she was found to have a temperature of 10 57F in triage.  She has not had any chills.  She denies dysuria.  The onset of her symptoms was relatively acute and occurred within the last 8 hours or so.  The symptoms are severe and nothing  has made them better or worse.   Past Medical History:  Diagnosis Date  . Acute respiratory failure ([place]) 11/29/2014  . 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 Asthma   . Non Hodgkin's lymphoma ([place]) [id number]  . Personal history of chemotherapy     Patient Active Problem List   Diagnosis Date Noted  . Sepsis ([place]) 09/12/2016  . Pneumothorax   . 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 Recurrent upper respiratory infection (URI) 09/01/2015  . Acute respiratory failure with hypoxia ([place])   . 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 SOB (shortness of breath)   . Pneumonia   . Acute respiratory failure ([place]) 11/29/2014  . History of digestive disease 11/29/2014  . HLD (hyperlipidemia) 11/29/2014  . Adult hypothyroidism 11/29/2014  . Lymphoma ([place]) 11/29/2014  . RAD (reactive airway disease) 11/29/2014  . 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 Pain in shoulder 11/01/2014  . Fracture of humerus, proximal 11/01/2014  . Febrile neutropenia ([place]) 11/08/2013  . Decreased potassium in the blood 09/02/2013  . Acquired hypothyroidism 07/01/2013  . Lymphoma, non-Hodgkin's ([place]) 06/30/2013  . Cervical lymphadenopathy 04/20/2013  . History of lymphoma 04/20/2013  . Dyspnea 01/29/2011  . Cough 01/29/2011    Past Surgical History:  Procedure Laterality Date  . 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 NASAL SINUS SURGERY  08/07/05 09/09/05   x 2   .  VIDEO BRONCHOSCOPY Bilateral 11/30/2014   Procedure: VIDEO BRONCHOSCOPY WITHOUT FLUORO;  Surgeon: Flora Lipps, MD;  Location: [facility] ORS;  Service: Cardiopulmonary;  Laterality: Bilateral;    Prior to Admission medications   Medication Sig Start Date End Date Taking? Authorizing Provider  albuterol (PROVENTIL HFA;VENTOLIN HFA) 108 (90 BASE) MCG/ACT inhaler Inhale 1 puff into the lungs every 6 (six) hours as needed for wheezing or shortness of breath.    Historical Provider, MD  albuterol (PROVENTIL)  (2.5 MG/3ML) 0.083% nebulizer solution Take 3 mLs (2.5 mg total) by nebulization every 4 (four) hours as needed for wheezing or shortness of breath. 05/21/16   Flora Lipps, MD  ALPRAZolam Duanne Moron) 0.5 MG tablet Take 1 tablet (0.5 mg total) by mouth at bedtime as needed for sleep. 09/24/16   Flora Lipps, MD  AMBULATORY NON FORMULARY MEDICATION Medication Name: Incentive Spirometer Use 10-15 times daily 07/29/16   Flora Lipps, MD  amoxicillin-clavulanate (AUGMENTIN) 875-125 MG tablet Take 1 tablet by mouth 2 (two) times daily. X 10 days 11/07/16   Flora Lipps, MD  aspirin EC 81 MG tablet Take 81 mg by mouth daily.    Historical Provider, MD  budesonide (PULMICORT) 0.5 MG/2ML nebulizer solution Take 2 mLs (0.5 mg total) by nebulization 2 (two) times daily. 11/07/16   Flora Lipps, MD  citalopram (CELEXA) 20 MG tablet TAKE 1 TABLET BY MOUTH ONCE DAILY 06/07/16   Cammie Sickle, MD  dextromethorphan-guaiFENesin [facility] DM) 30-600 MG 12hr tablet Take 1 tablet by mouth daily.    Historical Provider, MD  formoterol (PERFOROMIST) 20 MCG/2ML nebulizer solution Take 2 mLs (20 mcg total) by nebulization 2 (two) times daily. 11/07/16   Flora Lipps, MD  guaiFENesin-codeine 100-10 MG/5ML syrup Take 5 mLs by mouth every 4 (four) hours as needed for cough. 09/23/16   Flora Lipps, MD  levothyroxine (SYNTHROID, LEVOTHROID) 25 MCG tablet TAKE 1 TABLET BY MOUTH ONCE DAILY ON AN EMPTY STOMACH. WAIT 30 MINUTES BEFORE TAKING OTHER MEDS. 08/19/16   Cammie Sickle, MD  nystatin (MYCOSTATIN) 100000 UNIT/ML suspension Take 5 mLs (500,000 Units total) by mouth 4 (four) times daily. 09/13/16   Loletha Grayer, MD  omeprazole (PRILOSEC) 20 MG capsule Take 1 capsule (20 mg total) by mouth 2 (two) times daily before a meal. Patient taking differently: Take 20 mg by mouth daily.  03/05/16   Cammie Sickle, MD  ondansetron (ZOFRAN) 4 MG tablet TAKE 1 TABLET BY MOUTH EVERY 8 HOURS AS NEEDED NAUSEA AND VOMITING 07/03/16   Cammie Sickle, MD  ondansetron (ZOFRAN) 4 MG tablet Take 1 tablet (4 mg total) by mouth every 6 (six) hours as needed for nausea or vomiting. 11/07/16   Flora Lipps, MD  potassium chloride SA (K-DUR,KLOR-CON) 20 MEQ tablet TAKE 1 TABLET BY MOUTH TWICE DAILY 11/08/16   Cammie Sickle, MD  predniSONE (DELTASONE) 20 MG tablet 40 mg daily x 10 days then 20 mg daily everyday 11/07/16   Flora Lipps, MD  Respiratory Therapy Supplies (FLUTTER) DEVI Use 10-15 times daily 07/29/16   Flora Lipps, MD  vitamin C (ASCORBIC ACID) 500 MG tablet Take 500 mg by mouth daily.    Historical Provider, MD    Allergies Hydrocodone-homatropine; Tussionex pennkinetic er [employer] polst-cpm polst er]; and Ciprofloxacin  Family History  Problem Relation Age of Onset  . Asthma Son   . Congestive Heart Failure Mother   . Diabetes Brother   . Breast cancer Neg Hx     Social History Social History  Substance Use Topics  . Smoking status: Never Smoker  . Smokeless tobacco: Never Used  . Alcohol use Yes    Review of Systems Constitutional: No fever/chills of which she was aware, but she was febrile in triage.  Generalized weakness and lethargic. Eyes: No visual changes. ENT: Mild sore throat. Cardiovascular: Denies chest pain. Respiratory: Denies acute shortness of breath. Gastrointestinal: No abdominal pain.  Nausea with 2 or 3 episodes of emesis and at least one loose stool. Genitourinary: Negative for dysuria. Musculoskeletal: Negative for back pain. Integumentary: Negative for rash. Neurological: Negative for headaches, focal weakness or numbness.   ____________________________________________   PHYSICAL EXAM:  VITAL SIGNS: ED Triage Vitals  Enc Vitals Group     BP 11/15/16 2112 (!) 150/82     Pulse Rate 11/15/16 2112 96     Resp 11/15/16 2330 (!) 21     Temp 11/15/16 2112 (!) 101 F (38.3 C)     Temp Source 11/15/16 2112 Oral     SpO2 11/15/16 2112 93 %     Weight 11/15/16 2114 123 lb (55.8  kg)     Height 11/15/16 2114 5\' 1"  (1.549 m)     Head Circumference --      Peak Flow --      Pain Score --      Pain Loc --      Pain Edu? --      Excl. in [place]? --     Constitutional: Alert and oriented But lethargic.  However she answers questions appropriately. Eyes: Conjunctivae are normal. PERRL. EOMI. Head: Atraumatic. Nose: No congestion/rhinnorhea. Mouth/Throat: Mucous membranes are dry. Neck: No stridor.  No meningeal signs.   Cardiovascular: Borderline tachycardia with a heart rate of about 100, regular rhythm. Good peripheral circulation. Grossly normal heart sounds. Respiratory: Normal respiratory effort on 2 L of oxygen by nasal cannula.  No retractions.  Coarse breath sounds throughout without any expiratory wheezing. Gastrointestinal: Soft and nontender. No distention.  Musculoskeletal: No lower extremity tenderness nor edema. No gross deformities of extremities. Neurologic:  Normal speech and language. No gross focal neurologic deficits are appreciated.  Generalized weakness (appears somewhat effort dependent ) but no focal neurological deficits Skin:  Skin is warm, dry and intact. No rash noted.  ____________________________________________   LABS (all labs ordered are listed, but only abnormal results are displayed)  Labs Reviewed  BASIC METABOLIC PANEL - Abnormal; Notable for the following:       Result Value   Potassium 3.2 (*)    Glucose, Bld 117 (*)    BUN 27 (*)    Creatinine, Ser 1.25 (*)    GFR calc non Af Amer 42 (*)    GFR calc Af Amer 49 (*)    All other components within normal limits  CBC - Abnormal; Notable for the following:    RBC 3.78 (*)    RDW 16.0 (*)    Platelets 148 (*)    All other components within normal limits  URINALYSIS, COMPLETE (UACMP) WITH MICROSCOPIC - Abnormal; Notable for the following:    Color, Urine YELLOW (*)    APPearance CLEAR (*)    Glucose, UA 50 (*)    Protein, ur 30 (*)    Squamous Epithelial / LPF 0-5 (*)     All other components within normal limits  HEPATIC FUNCTION PANEL - Abnormal; Notable for the following:    Total Protein 6.2 (*)    Albumin 3.3 (*)    Bilirubin, Direct <0.1 (*)  All other components within normal limits  TROPONIN I  LIPASE, BLOOD  LACTIC ACID, PLASMA   ____________________________________________  EKG  ED ECG REPORT I, Anacristina Steffek, the attending physician, personally viewed and interpreted this ECG.  Date: 11/15/2016 EKG Time: 21:36 Rate: 91 Rhythm: normal sinus rhythm (borderline tachycardia) QRS Axis: normal Intervals: normal ST/T Wave abnormalities: normal Conduction Disturbances: none Narrative Interpretation: unremarkable  ____________________________________________  RADIOLOGY   Dg Chest 2 View  Result Date: 11/16/2016 CLINICAL DATA:  Diarrhea and nausea EXAM: CHEST  2 VIEW COMPARISON:  11/06/2016, 02/21/ 2018, 06/18/2016 FINDINGS: Left-sided central venous port tip overlies the knee distal SVC. Pleural thickening or small effusion on the right appears unchanged. Hilar retraction and upper lobe fibrotic changes as well as pleural disease in the right apex are grossly similar. Stable heart size. No pneumothorax. IMPRESSION: Stable radiographic appearance of the chest with right greater than left upper lobe pulmonary scarring and hilar retraction as well as right pleural thickening and probable bibasilar scarring. Electronically Signed   By: Donavan Foil M.D.   On: 11/16/2016 00:02    ____________________________________________   PROCEDURES  Critical Care performed: No   Procedure(s) performed:   Procedures   ____________________________________________   INITIAL IMPRESSION / ASSESSMENT AND PLAN / ED COURSE  Pertinent labs & imaging results that were available during my care of the patient were reviewed by me and considered in my medical decision making (see chart for details).  The patient was febrile upon arrival to the  emergency department but does not meet sepsis criteria.  Her heart rate is borderline at this point that she has no leukocytosis and no tachypnea.  A source of infection has not yet been identified but I am awaiting results of chest x-ray and urinalysis.  I ordered a lactic acid on top of her labs were ordered initially.  Her metabolic panel was generally reassuring with a creatinine of 1.2 which is within the range of normal for her although her BUN is slightly elevated and she does appear dry clinically.  I will give 1 L of fluids follow awaiting the rest of the workup.  The patient and husband are comfortable with this plan.  Disposition is unclear at this time until the workup is complete and we reassess how the patient feels after fluid bolus.  No evidence of meningitis at this time, no indication for lumbar puncture.  Similarly, although the patient has generalized weakness, she has no focal neurological deficits and no signs or symptoms of TIA/CVA with no indication for imaging of her head/brain.   Clinical Course as of Nov 17 554  Sat Nov 16, 2016  0011 Radiologist reports stable CXR compared to priors with no evidence of acute infection. DG Chest 2 View [CF]  0103 Normal lactate. Lactic Acid, Venous: 1.1 [CF]  0149 The patient's workup has been generally reassuring.  I will give her a potassium supplement.  She has completed her 1 L normal saline bolus and she states that she feels better.  I explained to the patient and husband that I cannot explain her fever, and that she has a slightly elevated heart rate at around 100-105, but the rest of her lab work is reassuring.  She states that she feels better and wants to go home and her husband concurs.  I gave strict return precautions if she is to develop any worsening symptoms.  They understand and agree with the plan.  [CF]    Clinical Course User Index [CF] Tommi Rumps  Karma Greaser, MD    ____________________________________________  FINAL CLINICAL  IMPRESSION(S) / ED DIAGNOSES  Final diagnoses:  Volume depletion  Generalized weakness  Fever, unspecified fever cause  Nausea and vomiting, intractability of vomiting not specified, unspecified vomiting type  Hypokalemia, gastrointestinal losses     MEDICATIONS GIVEN DURING THIS VISIT:  Medications  sodium chloride 0.9 % bolus 1,000 mL (0 mLs Intravenous Stopped 11/16/16 0040)  potassium chloride SA (K-DUR,KLOR-CON) CR tablet 40 mEq (40 mEq Oral Given 11/16/16 0211)     NEW OUTPATIENT MEDICATIONS STARTED DURING THIS VISIT:  Discharge Medication List as of 11/16/2016  1:54 AM      Discharge Medication List as of 11/16/2016  1:54 AM      Discharge Medication List as of 11/16/2016  1:54 AM       Note:  This document was prepared using Dragon voice recognition software and may include unintentional dictation errors.    Hinda Kehr, MD 11/16/16 [phone]

## 2016-11-16 NOTE — ED Notes (Signed)
Pt reports nausea gone. 

## 2016-11-16 NOTE — ED Notes (Signed)
MD Forbach at bedside. 

## 2016-11-16 NOTE — Discharge Instructions (Signed)
As we discussed, your workup today was reassuring.  Though we do not know exactly what is causing your symptoms, it appears that you have no emergent medical condition at this time and that you are safe to go home and follow up as recommended in this paperwork.  Please drink plenty of fluids and rest as much as possible.  Take your regular medications including your potassium supplements; your potassium level was a bit low today, likely due to the vomiting.  Please return immediately to the Emergency Department if you develop any new or worsening symptoms that concern you.

## 2016-11-19 ENCOUNTER — Telehealth: Payer: Self-pay | Admitting: Internal Medicine

## 2016-11-19 NOTE — Telephone Encounter (Signed)
Please see message below and advise any further:

## 2016-11-19 NOTE — Telephone Encounter (Signed)
Pt spouse is stating pt is going d/c her inhalers. States she had a reaction to the inhalers and medication in her nebulizers. He states this past Friday night she became dehydrated. States she is having a sore throat, runny nose, and back pain.  Please call.

## 2016-11-21 ENCOUNTER — Inpatient Hospital Stay: Payer: Medicare Other

## 2016-11-21 ENCOUNTER — Inpatient Hospital Stay: Payer: Medicare Other | Attending: Internal Medicine

## 2016-11-21 ENCOUNTER — Inpatient Hospital Stay (HOSPITAL_BASED_OUTPATIENT_CLINIC_OR_DEPARTMENT_OTHER): Payer: Medicare Other | Admitting: Internal Medicine

## 2016-11-21 ENCOUNTER — Other Ambulatory Visit: Payer: Self-pay | Admitting: *Deleted

## 2016-11-21 VITALS — BP 133/70 | HR 95 | Temp 97.8°F | Resp 22 | Ht 61.0 in | Wt 125.0 lb

## 2016-11-21 DIAGNOSIS — J45909 Unspecified asthma, uncomplicated: Secondary | ICD-10-CM

## 2016-11-21 DIAGNOSIS — Z79899 Other long term (current) drug therapy: Secondary | ICD-10-CM

## 2016-11-21 DIAGNOSIS — Z9221 Personal history of antineoplastic chemotherapy: Secondary | ICD-10-CM

## 2016-11-21 DIAGNOSIS — D61818 Other pancytopenia: Secondary | ICD-10-CM

## 2016-11-21 DIAGNOSIS — D630 Anemia in neoplastic disease: Secondary | ICD-10-CM | POA: Diagnosis not present

## 2016-11-21 DIAGNOSIS — J9610 Chronic respiratory failure, unspecified whether with hypoxia or hypercapnia: Secondary | ICD-10-CM | POA: Insufficient documentation

## 2016-11-21 DIAGNOSIS — Z8572 Personal history of non-Hodgkin lymphomas: Secondary | ICD-10-CM | POA: Insufficient documentation

## 2016-11-21 DIAGNOSIS — E876 Hypokalemia: Secondary | ICD-10-CM | POA: Insufficient documentation

## 2016-11-21 DIAGNOSIS — Z452 Encounter for adjustment and management of vascular access device: Secondary | ICD-10-CM

## 2016-11-21 DIAGNOSIS — Z7982 Long term (current) use of aspirin: Secondary | ICD-10-CM

## 2016-11-21 DIAGNOSIS — C8331 Diffuse large B-cell lymphoma, lymph nodes of head, face, and neck: Secondary | ICD-10-CM

## 2016-11-21 DIAGNOSIS — Z9481 Bone marrow transplant status: Secondary | ICD-10-CM | POA: Insufficient documentation

## 2016-11-21 LAB — BASIC METABOLIC PANEL
ANION GAP: 6 (ref 5–15)
BUN: 33 mg/dL — ABNORMAL HIGH (ref 6–20)
CALCIUM: 9.1 mg/dL (ref 8.9–10.3)
CHLORIDE: 103 mmol/L (ref 101–111)
CO2: 27 mmol/L (ref 22–32)
Creatinine, Ser: 0.98 mg/dL (ref 0.44–1.00)
GFR calc non Af Amer: 56 mL/min — ABNORMAL LOW (ref >60)
Glucose, Bld: 91 mg/dL (ref 65–99)
POTASSIUM: 3 mmol/L — AB (ref 3.5–5.1)
Sodium: 136 mmol/L (ref 135–145)

## 2016-11-21 LAB — CBC WITH DIFFERENTIAL/PLATELET
Basophils Absolute: 0 10*3/uL (ref 0–0.1)
Basophils Relative: 0 %
EOS ABS: 0 10*3/uL (ref 0–0.7)
EOS PCT: 0 %
HEMATOCRIT: 37.2 % (ref 35.0–47.0)
HEMOGLOBIN: 12.8 g/dL (ref 12.0–16.0)
LYMPHS ABS: 1.3 10*3/uL (ref 1.0–3.6)
LYMPHS PCT: 12 %
MCH: 34 pg (ref 26.0–34.0)
MCHC: 34.5 g/dL (ref 32.0–36.0)
MCV: 98.4 fL (ref 80.0–100.0)
MONOS PCT: 8 %
Monocytes Absolute: 0.8 10*3/uL (ref 0.2–0.9)
Neutro Abs: 8.2 10*3/uL — ABNORMAL HIGH (ref 1.4–6.5)
Neutrophils Relative %: 80 %
Platelets: 173 10*3/uL (ref 150–440)
RBC: 3.78 MIL/uL — AB (ref 3.80–5.20)
RDW: 16.1 % — AB (ref 11.5–14.5)
WBC: 10.3 10*3/uL (ref 3.6–11.0)

## 2016-11-21 MED ORDER — NYSTATIN 100000 UNIT/ML MT SUSP: 5.0000 mL | Freq: Four times a day (QID) | OROMUCOSAL | 0 refills | Status: DC

## 2016-11-21 MED ORDER — POTASSIUM CHLORIDE CRYS ER 20 MEQ PO TBCR: 20.0000 meq | EXTENDED_RELEASE_TABLET | Freq: Two times a day (BID) | ORAL | 0 refills | Status: DC

## 2016-11-21 MED ORDER — SODIUM CHLORIDE 0.9% FLUSH
10.0000 mL | INTRAVENOUS | Status: DC | PRN
  Administered 2016-11-21: 10 mL via INTRAVENOUS
  Filled 2016-11-21: qty 10

## 2016-11-21 MED ORDER — HEPARIN SOD (PORK) LOCK FLUSH 100 UNIT/ML IV SOLN
500.0000 [IU] | Freq: Once | INTRAVENOUS | Status: AC
  Administered 2016-11-21: 500 [IU] via INTRAVENOUS

## 2016-11-21 NOTE — Progress Notes (Signed)
Has not started nystatin RF today. Pt reports white patches in mouth/on tongue.  Pt has finished her Augmentin.  She is currently on 20 mg of Prednisone per day.

## 2016-11-21 NOTE — Progress Notes (Signed)
[place] OFFICE PROGRESS NOTE  Patient Care Team: Madelyn Brunner, MD as PCP - General (Unknown Physician Specialty) Madelyn Brunner, MD as Referring Physician (Unknown Physician Specialty) Forest Gleason, MD (Unknown Physician Specialty) Christene Lye, MD (General Surgery)  Cancer Staging No matching staging information was found for the patient.   Oncology History   1.  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  Status post 6 cycles of chemotherapy with CHOP9nd involved field radiation treatment 18,00 rads to the mantle field  and mediasinal boost 2340. finished radiation and chemotherapy in November of 2000 residual mediastinotomy mass 2 cm 2.  In May of 2001 progressive disease with mediastinal mass becoming bigger 8 cm3.  The patient underwent 2 cycles of chemotherapy withRituxan, and ICE.(June of 2001) Followed by 75more cycles  at [facility] with DHAP.and high-dose chemotherapy with  BEAM finished treatment in October of 2001. patient had significant pancytopenia as well as fever lasting for 3 months after transplant. Recurrent sinus infection and pulmonary infection 3.  Patient  underwent EUS biopsy of which was negative .  (November, 2012) 4.  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  Started on RICE october 2014 6.ppatient is now being considered for high-dose chemotherapy and stem cell support (March, 2015) 7.2nd stem cell transplant cannot be done because of the  no stem cell to harvest     Lymphoma, non-Hodgkin's ([place])   06/30/2013 Initial Diagnosis    Lymphoma, non-Hodgkin's       Diffuse large B-cell lymphoma of lymph nodes of neck ([place])    INTERVAL HISTORY:  Anna Mcbride 71 y.o.  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   Patient was  Recently evaluated in the emergency room for fevers/ dehydration-  IV antibiotic;  IV fluids;  Prednisone.  As per the family she perked up  Quickly after receiving IV fluids.   Otherwise denies any  Worsening cough.  No unusual shortness of breath.  No new lumps or bumps. No nausea or vomiting. No significant weight loss.[name]   REVIEW OF SYSTEMS:  A complete 10 point review of system is done which is negative except mentioned above/history of present illness.   PAST MEDICAL HISTORY :  Past Medical History:  Diagnosis Date  . Acute respiratory failure ([place]) 11/29/2014  . 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 Asthma   . Non Hodgkin's lymphoma ([place]) [id number]  . Personal history of chemotherapy     PAST SURGICAL HISTORY :   Past Surgical History:  Procedure Laterality Date  . 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 NASAL SINUS SURGERY  08/07/05 09/09/05   x 2   . VIDEO BRONCHOSCOPY Bilateral 11/30/2014   Procedure: VIDEO BRONCHOSCOPY WITHOUT FLUORO;  Surgeon: Flora Lipps, MD;  Location: [facility] ORS;  Service: Cardiopulmonary;  Laterality: Bilateral;    FAMILY HISTORY :   Family History  Problem Relation Age of Onset  . Asthma Son   . Congestive Heart Failure Mother   . Diabetes Brother   . Breast cancer Neg Hx     SOCIAL HISTORY:   Social History  Substance Use Topics  . Smoking status: Never Smoker  . Smokeless tobacco: Never Used  .  Alcohol use Yes    ALLERGIES:  is allergic to hydrocodone-homatropine; tussionex pennkinetic er [hydrocod polst-cpm polst er]; and ciprofloxacin.  MEDICATIONS:  Current Outpatient Prescriptions  Medication Sig Dispense Refill  . albuterol (PROVENTIL HFA;VENTOLIN HFA) 108 (90 BASE) MCG/ACT inhaler Inhale 1 puff into the lungs every 6 (six) hours as needed for wheezing or shortness of breath.    [name] albuterol (PROVENTIL) (2.5 MG/3ML) 0.083% nebulizer solution Take 3 mLs (2.5  mg total) by nebulization every 4 (four) hours as needed for wheezing or shortness of breath. 75 mL 12  . ALPRAZolam (XANAX) 0.5 MG tablet Take 1 tablet (0.5 mg total) by mouth at bedtime as needed for sleep. 20 tablet 1  . AMBULATORY NON FORMULARY MEDICATION Medication Name: Incentive Spirometer Use 10-15 times daily 1 each 0  . aspirin EC 81 MG tablet Take 81 mg by mouth daily.    . budesonide (PULMICORT) 0.5 MG/2ML nebulizer solution Take 2 mLs (0.5 mg total) by nebulization 2 (two) times daily. 120 mL 11  . citalopram (CELEXA) 20 MG tablet TAKE 1 TABLET BY MOUTH ONCE DAILY 90 tablet 3  . dextromethorphan-guaiFENesin (MUCINEX DM) 30-600 MG 12hr tablet Take 1 tablet by mouth daily.    . formoterol (PERFOROMIST) 20 MCG/2ML nebulizer solution Take 2 mLs (20 mcg total) by nebulization 2 (two) times daily. 120 mL 11  . guaiFENesin-codeine 100-10 MG/5ML syrup Take 5 mLs by mouth every 4 (four) hours as needed for cough. 240 mL 0  . levothyroxine (SYNTHROID, LEVOTHROID) 25 MCG tablet TAKE 1 TABLET BY MOUTH ONCE DAILY ON AN EMPTY STOMACH. WAIT 30 MINUTES BEFORE TAKING OTHER MEDS. 30 tablet 2  . omeprazole (PRILOSEC) 20 MG capsule Take 1 capsule (20 mg total) by mouth 2 (two) times daily before a meal. (Patient taking differently: Take 20 mg by mouth daily. ) 60 capsule 3  . ondansetron (ZOFRAN) 4 MG tablet Take 1 tablet (4 mg total) by mouth every 6 (six) hours as needed for nausea or vomiting. 30 tablet 2  . potassium chloride SA (K-DUR,KLOR-CON) 20 MEQ tablet Take 1 tablet (20 mEq total) by mouth 2 (two) times daily. 30 tablet 0  . predniSONE (DELTASONE) 20 MG tablet 40 mg daily x 10 days then 20 mg daily everyday (Patient taking differently: Take 20 mg by mouth daily with breakfast. 40 mg daily x 10 days then 20 mg daily everyday) 45 tablet 5  . Respiratory Therapy Supplies (FLUTTER) DEVI Use 10-15 times daily 1 each 0  . vitamin C (ASCORBIC ACID) 500 MG tablet Take 500 mg by mouth daily.    [name]  nystatin (MYCOSTATIN) 100000 UNIT/ML suspension Take 5 mLs (500,000 Units total) by mouth 4 (four) times daily. 120 mL 0   No current facility-administered medications for this visit.     PHYSICAL EXAMINATION: ECOG PERFORMANCE STATUS: 1 - Symptomatic but completely ambulatory  BP 133/70 (BP Location: Left Arm, Patient Position: Sitting)   Pulse 95   Temp 97.8 F (36.6 C) (Tympanic)   Resp (!) 22   Ht 5\' 1"  (1.549 m)   Wt 125 lb (56.7 kg)   LMP  (LMP Unknown)   SpO2 99% Comment: 2L  BMI 23.62 kg/m   Filed Weights   11/21/16 0954  Weight: 125 lb (56.7 kg)    GENERAL: Thin built moderately nourished;  Alert, no distress and comfortable.   Accompanied by her husband.  Wearing oxygen. EYES: no pallor or icterus OROPHARYNX: no thrush or ulceration; good dentition  NECK: supple,  no masses felt LYMPH:  no palpable lymphadenopathy in the cervical, axillary or inguinal regions LUNGS: Bilateral coarse breath sounds to auscultation and  No wheeze or crackles HEART/CVS: regular rate & rhythm and no murmurs; No lower extremity edema ABDOMEN:abdomen soft, non-tender and normal bowel sounds Musculoskeletal:no cyanosis of digits and no clubbing  PSYCH: alert & oriented x 3 with fluent speech NEURO: no focal motor/sensory deficits SKIN:  no rashes or significant lesions  LABORATORY DATA:  I have reviewed the data as listed    Component Value Date/Time   NA 136 11/21/2016 0928   NA 140 11/16/2014 1415   K 3.0 (L) 11/21/2016 0928   K 3.3 (L) 11/16/2014 1415   CL 103 11/21/2016 0928   CL 107 11/16/2014 1415   CO2 27 11/21/2016 0928   CO2 29 11/16/2014 1415   GLUCOSE 91 11/21/2016 0928   GLUCOSE 117 (H) 11/16/2014 1415   BUN 33 (H) 11/21/2016 0928   BUN 31 (H) 11/16/2014 1415   CREATININE 0.98 11/21/2016 0928   CREATININE 1.19 (H) 11/16/2014 1415   CALCIUM 9.1 11/21/2016 0928   CALCIUM 9.1 11/16/2014 1415   PROT 6.2 (L) 11/15/2016 2124   PROT 6.8 11/16/2014 1415   ALBUMIN 3.3  (L) 11/15/2016 2124   ALBUMIN 3.8 11/16/2014 1415   AST 21 11/15/2016 2124   AST 17 11/16/2014 1415   ALT 18 11/15/2016 2124   ALT 11 (L) 11/16/2014 1415   ALKPHOS 49 11/15/2016 2124   ALKPHOS 115 11/16/2014 1415   BILITOT 0.4 11/15/2016 2124   BILITOT 0.4 11/16/2014 1415   GFRNONAA 56 (L) 11/21/2016 0928   GFRNONAA 46 (L) 11/16/2014 1415   GFRAA >60 11/21/2016 0928   GFRAA 54 (L) 11/16/2014 1415    No results found for: SPEP, UPEP  Lab Results  Component Value Date   WBC 10.3 11/21/2016   NEUTROABS 8.2 (H) 11/21/2016   HGB 12.8 11/21/2016   HCT 37.2 11/21/2016   MCV 98.4 11/21/2016   PLT 173 11/21/2016      Chemistry      Component Value Date/Time   NA 136 11/21/2016 0928   NA 140 11/16/2014 1415   K 3.0 (L) 11/21/2016 0928   K 3.3 (L) 11/16/2014 1415   CL 103 11/21/2016 0928   CL 107 11/16/2014 1415   CO2 27 11/21/2016 0928   CO2 29 11/16/2014 1415   BUN 33 (H) 11/21/2016 0928   BUN 31 (H) 11/16/2014 1415   CREATININE 0.98 11/21/2016 0928   CREATININE 1.19 (H) 11/16/2014 1415      Component Value Date/Time   CALCIUM 9.1 11/21/2016 0928   CALCIUM 9.1 11/16/2014 1415   ALKPHOS 49 11/15/2016 2124   ALKPHOS 115 11/16/2014 1415   AST 21 11/15/2016 2124   AST 17 11/16/2014 1415   ALT 18 11/15/2016 2124   ALT 11 (L) 11/16/2014 1415   BILITOT 0.4 11/15/2016 2124   BILITOT 0.4 11/16/2014 1415       RADIOGRAPHIC STUDIES: I have personally reviewed the radiological images as listed and agreed with the findings in the report. No results found.   ASSESSMENT & PLAN:  Diffuse large B-cell lymphoma of lymph nodes of neck ([place]) # DLBCL-status post relapse; status post autologous stem cell transplant.  last treatment in 2015; PET May 2016- NED. Clinically no evidence of recurrence  # Transient/mild pancytopenia- resolved;  Question viral.  # Chronic respiratory failure on oxygen; multiple lung infections; ? Bronchitis vs pneumonia. Currently improved.  #  Hypokalemia-  Potassium 3.0;  Prescription for potassium  Given.  # Follow-up with me as planned in appx 3 months/ labs port flush/ MD.  Port for 6 weeks.    Orders Placed This Encounter  Procedures  . CBC with Differential    Standing Status:   Future    Standing Expiration Date:   11/21/2017  . Comprehensive metabolic panel    Standing Status:   Future    Standing Expiration Date:   11/21/2017   All questions were answered. The patient knows to call the clinic with any problems, questions or concerns.      Cammie Sickle, MD 11/22/2016 6:17 PM

## 2016-11-21 NOTE — Telephone Encounter (Signed)
This is sounds a viral infection, that has been going around, and is unlikely to be due to an allergy to her medications. Would not recommend stopping her medications.

## 2016-11-21 NOTE — Assessment & Plan Note (Addendum)
#   DLBCL-status post relapse; status post autologous stem cell transplant.  last treatment in 2015; PET May 2016- NED. Clinically no evidence of recurrence  # Transient/mild pancytopenia- resolved;  Question viral.  # Chronic respiratory failure on oxygen; multiple lung infections; ? Bronchitis vs pneumonia. Currently improved.  # Hypokalemia-  Potassium 3.0;  Prescription for potassium  Given.  # Follow-up with me as planned in appx 3 months/ labs port flush/ MD.  Port for 6 weeks.

## 2016-11-22 ENCOUNTER — Ambulatory Visit: Attending: Internal Medicine | Primary: Internal Medicine

## 2016-11-22 DIAGNOSIS — I10 Essential (primary) hypertension: Secondary | ICD-10-CM

## 2016-11-22 DIAGNOSIS — F329 Major depressive disorder, single episode, unspecified: Secondary | ICD-10-CM

## 2016-11-22 DIAGNOSIS — Z6841 Body Mass Index (BMI) 40.0 and over, adult: Secondary | ICD-10-CM

## 2016-11-22 DIAGNOSIS — Z794 Long term (current) use of insulin: Secondary | ICD-10-CM

## 2016-11-22 DIAGNOSIS — F0280 Dementia in other diseases classified elsewhere without behavioral disturbance: Secondary | ICD-10-CM

## 2016-11-22 DIAGNOSIS — E1142 Type 2 diabetes mellitus with diabetic polyneuropathy: Secondary | ICD-10-CM

## 2016-11-22 DIAGNOSIS — E785 Hyperlipidemia, unspecified: Secondary | ICD-10-CM

## 2016-11-22 DIAGNOSIS — M1990 Unspecified osteoarthritis, unspecified site: Secondary | ICD-10-CM

## 2016-11-22 DIAGNOSIS — K219 Gastro-esophageal reflux disease without esophagitis: Secondary | ICD-10-CM

## 2016-11-22 DIAGNOSIS — N3941 Urge incontinence: Secondary | ICD-10-CM

## 2016-11-22 DIAGNOSIS — Z7189 Other specified counseling: Principal | ICD-10-CM

## 2016-11-22 DIAGNOSIS — R911 Solitary pulmonary nodule: Secondary | ICD-10-CM

## 2016-11-22 DIAGNOSIS — L0390 Cellulitis, unspecified: Secondary | ICD-10-CM

## 2016-11-22 DIAGNOSIS — G309 Alzheimer's disease, unspecified: Secondary | ICD-10-CM

## 2016-11-22 DIAGNOSIS — K210 Gastro-esophageal reflux disease with esophagitis: Secondary | ICD-10-CM

## 2016-11-22 DIAGNOSIS — F419 Anxiety disorder, unspecified: Secondary | ICD-10-CM

## 2016-11-22 DIAGNOSIS — E119 Type 2 diabetes mellitus without complications: Principal | ICD-10-CM

## 2016-11-22 MED ORDER — LISINOPRIL 10 MG PO TABS
0 refills | Status: CP
Start: 2016-11-22 — End: 2017-04-28

## 2016-11-22 MED ORDER — GABAPENTIN 400 MG PO CAPS
0 refills | Status: CP
Start: 2016-11-22 — End: 2017-04-08

## 2016-11-22 MED ORDER — DOXYCYCLINE MONOHYDRATE 100 MG PO CAPS
100 mg | Freq: Two times a day (BID) | ORAL | 0 refills | Status: CP
Start: 2016-11-22 — End: 2017-02-21

## 2016-11-22 MED ORDER — OXYBUTYNIN CHLORIDE 5 MG PO TABS
0 refills | Status: CP
Start: 2016-11-22 — End: 2017-06-16

## 2016-11-22 MED ORDER — PRAVASTATIN SODIUM 40 MG PO TABS
0 refills | Status: CP
Start: 2016-11-22 — End: 2017-04-28

## 2016-11-22 MED ORDER — RANITIDINE HCL 150 MG PO TABS
0 refills | Status: CP
Start: 2016-11-22 — End: 2017-04-28

## 2016-11-22 MED ORDER — TORSEMIDE 20 MG PO TABS
0 refills | Status: CP
Start: 2016-11-22 — End: 2017-04-28

## 2016-11-22 MED ORDER — HYDROCHLOROTHIAZIDE 25 MG PO TABS
0 refills | Status: CP
Start: 2016-11-22 — End: 2017-04-28

## 2016-11-22 MED ORDER — DONEPEZIL HCL 5 MG PO TABS
0 refills | Status: CP
Start: 2016-11-22 — End: 2017-04-28

## 2016-11-22 NOTE — Progress Notes
Subjective:   Delfina RedwoodMary Jeanbaptiste is a 71 y.o. female being seen today for Follow-up and Medications Refill       HPI  patient is her for fu and meds refill, no recent lab, CO of bilateral leg swelling and redness of the skin, no fever, she has cellulitis. longstanding insulin dependent DM, she is fasting today to get her lab done, still not complient with diet or meds, home sugars level still flactuate, denied hypoglycemic symptoms. BP is wnl, she is adherent to low salt diet and complient with current antihypertensive medicine. No chest pressure/discomfort, palpitations, irregular heart beats, near-syncope, syncope. GERD, dementia and dyslipidemia are well controlled with meds, denied meds side effects. Due for repeated CT chest to fu on her lung nodule.  Past Medical History:   Diagnosis Date   ? Anxiety    ? Arthritis    ? Depression    ? Diabetes mellitus    ? GERD (gastroesophageal reflux disease)    ? Hyperlipidemia    ? Hypertension      Past Surgical History:   Procedure Laterality Date   ? HYSTERECTOMY       Family History   Problem Relation Age of Onset   ? No Known Problems Mother    ? No Known Problems Father      Social History     Social History   ? Marital status: Single     Spouse name: N/A   ? Number of children: N/A   ? Years of education: N/A     Occupational History   ? Not on file.     Social History Main Topics   ? Smoking status: Never Smoker   ? Smokeless tobacco: Never Used   ? Alcohol use No   ? Drug use: No   ? Sexual activity: No     Other Topics Concern   ? Not on file     Social History Narrative     Current Outpatient Prescriptions on File Prior to Visit   Medication Sig   ? ALPRAZolam (XANAX) 0.5 MG tablet Take 1 Tablet by mouth 2 times daily.   ? diclofenac (VOLTAREN GEL) 1 % Gel topical gel APPLY 4 GM TOPICALLY FOURTIMES A DAY **MEASURE    DOSE USING ENCLOSED DOSE CARD*   ? [DISCONTINUED] donepezil (ARICEPT) 5 MG PO Tablet TAKE ONE TABLET BY MOUTH AT BEDTIME .[name].THANK YOU..[name]

## 2016-11-22 NOTE — Progress Notes
Sig: TAKE TWO TABLETS TWICE A DAY .[name].THANK YOU.[name].     Dispense:  360 tablet     Refill:  0     NEEDS REFILL   ? hydroCHLOROthiazide (HYDRODIURIL) 25 MG PO Tablet     Sig: TAKE ONE TABLET EVERY DAYFOR FLUIDS .[name].THANK YOU.[name].     Dispense:  90 tablet     Refill:  0     NEEDS REFILL   ? torsemide (DEMADEX) 20 MG PO Tablet     Sig: TAKE ONE TABLET EVERY    MORNING ##THANK YOU##     Dispense:  90 tablet     Refill:  0     NEEDS REFILL   ? gabapentin (NEURONTIN) 400 MG PO Capsule     Sig: TAKE 2 CAPSULES BY MOUTH 3 TIMES DAILY.     Dispense:  540 capsule     Refill:  0     NEEDS REFILLS   ? doxycycline (MONODOX) 100 MG PO Capsule     Sig: Take 1 capsule by mouth 2 times daily.     Dispense:  20 capsule     Refill:  0     Orders Placed This Encounter   Procedures   ? CT Chest w Con   ? Comprehensive Metabolic Panel   ? Hemoglobin A1c w/EAG   ? Microalbumin/Creat Urine Ratio   Teaching re:  DM: untreated elevations of blood sugar can lead to problems with eyes, nerves, kidneys, heart, blood vessels, and integrity of feet.  Discussed pathophysiology of pancreas, insulin, insulin resistance, effect of obesity and smoking, concept of syndrome X  Importance of monitoring blood sugars and recording results is discussed at length.  Patient has materials for keeping records and for measuring.  Educational materials regarding diabetes is given to patient.   Patient is made aware of the importance of monitoring HgbA1c every three to six months, having a yearly dilated eye exam, checking feet regularly for damage to the skin, monitoring cholesterol, seeing an Opthalmologist yearly and maintaining a diet consistent with diabetes care.  Patient given educational material regarding diabetes.      Health Maintenance was reviewed. The patient's HM Topic list was:                                            Health Maintenance   Topic Date Due   ? Preventive Wellness Visit  02/22/2017 (Originally 11/16/1963)

## 2016-11-22 NOTE — Progress Notes
?   DTaP,Tdap,and Td Vaccines (1 - Tdap) 02/22/2017 (Originally 11/17/1964)   ? Pneumovax / Prevnar (1 of 2 - PCV13) 02/22/2017 (Originally 11/16/2008)   ? Zoster Vaccine (1) 02/22/2017 (Originally 11/17/2003)   ? Diabetic Foot Exam  02/22/2017 (Originally 10/23/2016)   ? Diabetic Eye Exam  02/22/2017 (Originally 08/17/2016)   ? Hemoglobin A1C  01/27/2017   ? Lipid Profile  07/30/2017   ? Urine Microalbumin  07/30/2017   ? Creatinine  07/30/2017   ? Basic Metabolic Panel  07/30/2017   ? Breast Cancer Screening  10/16/2017   ? Dexa  04/18/2025   ? Colon Cancer Screening  06/04/2026   ? USPSTF Hepatitis C Screening  Completed   ? Influenza Vaccine  Addressed

## 2016-11-22 NOTE — Progress Notes
?   esomeprazole (NexIUM) 40 MG Capsule Delayed Release TAKE ONE CAPSULE EVERY   DAY .[name].THANK YOU..   ? fluticasone (FLONASE) 50 MCG/ACT NA Suspension SPRAY ONE PUFF INTO NOSE TWICE A DAY ==SHAKE      GENTLY==   ? fluticasone-salmeterol (ADVAIR DISKUS) 250-50 MCG/DOSE Aerosol Powder Breath Activated Inhale 1 puff 2 times daily. Rinse mouth well and spit after each use.   ? [DISCONTINUED] gabapentin (NEURONTIN) 400 MG Capsule TAKE 2 CAPSULES BY MOUTH 3 TIMES DAILY.   ? GLOBAL INJECT EASE INSULIN SYR 31G X 5/16" 1 ML Miscellaneous USE ONE TWICE A DAY      .[name].THANK YOU..   ? [DISCONTINUED] hydroCHLOROthiazide (HYDRODIURIL) 25 MG PO Tablet TAKE ONE TABLET EVERY DAYFOR FLUIDS .[name].THANK YOU..   ? insulin lispro protamine-lispro (HumaLOG 75/25) vial 60 units qam 30 min before breakfast and 40 unit qpm 30 min before supper   ? [DISCONTINUED] lisinopril (PRINIVIL,ZESTRIL) 10 MG PO Tablet TAKE ONE TABLET EVERY DAY.[name].THANK YOU..   ? meclizine (ANTIVERT) 25 MG Tablet TAKE ONE OR TWO TABLETS  EVERY EIGHT HOURS .[name].THANKYOU..   ? nystatin (MYCOSTATIN) 100000 UNIT/ML Suspension Take 5 mLs by mouth 4 times daily.   ? [DISCONTINUED] oxybutynin (DITROPAN) 5 MG PO Tablet TAKE TWO TABLETS TWICE A DAY .[name].THANK YOU..   ? [DISCONTINUED] pravastatin (PRAVACHOL) 40 MG PO Tablet TAKE ONE TABLET AT       BEDTIME .[name].THANK YOU..   ? PROAIR HFA 108 (90 Base) MCG/ACT IN Aerosol Solution INHALE 2 PUFFS (INTO     LUNGS) EVERY SIX HOURS ASNEEDED --SHAKE WELL--   ? [DISCONTINUED] raNITIdine (ZANTAC) 150 MG PO Tablet TAKE ONE TABLET TWICE A  DAY ##THANK YOU##   ? tiZANidine (ZANAFLEX) 2 MG PO Tablet TAKE TWO TABLETS EVERY   EIGHT HOURS (ONLY WHEN   NEEDED) ##THANK YOU##   ? [DISCONTINUED] torsemide (DEMADEX) 20 MG PO Tablet TAKE ONE TABLET EVERY    MORNING ##THANK YOU##   ? VITAMIN D-2 50000 UNITS Capsule TAKE ONE CAPSULE BY MOUTHTWO TIMES PER WEEK FOR 6 WEEKS .[name].THANK YOU.[name].     No current facility-administered medications on file prior to visit.      Allergies

## 2016-11-22 NOTE — Progress Notes
-  TD at 11/22/16 1344       Does patient have a fear of falling? No    -TD at 11/22/16 1344         User Key  (r) = Recorded By, (t) = Taken By, (c) = Cosigned By    Initials Name Effective Dates    TD Duffy, Tammy, MA 10/24/15 -         Physical Exam   Constitutional: She is oriented to person, place, and time. She appears well-developed and well-nourished.   HENT:   Head: Normocephalic and atraumatic.   Right Ear: External ear normal.   Left Ear: External ear normal.   Nose: Nose normal.   Mouth/Throat: Oropharynx is clear and moist.   Eyes: Conjunctivae and EOM are normal. Pupils are equal, round, and reactive to light.   Neck: Normal range of motion. Neck supple.   Cardiovascular: Normal rate, regular rhythm, normal heart sounds and intact distal pulses.    Pulmonary/Chest: Effort normal. She has decreased breath sounds.   Abdominal: Soft. Bowel sounds are normal.   Musculoskeletal: Normal range of motion. She exhibits edema and tenderness.   Allover joints, 2+ edema both legs   Neurological: She is alert and oriented to person, place, and time. She has normal reflexes.   Skin: Skin is warm and dry.        cellultitis   Psychiatric: She has a normal mood and affect. Her behavior is normal. Judgment and thought content normal. Cognition and memory are impaired. She exhibits abnormal recent memory.   Nursing note and vitals reviewed.        Assessment:       ICD-10-CM ICD-9-CM    1. Advanced directives, counseling/discussion Z71.89 V65.49    2. BMI 40.0-44.9, adult Z68.41 V85.41    3. Hyperlipidemia, unspecified hyperlipidemia type E78.5 272.4 pravastatin (PRAVACHOL) 40 MG PO Tablet   4. Essential hypertension I10 401.9 lisinopril (PRINIVIL,ZESTRIL) 10 MG PO Tablet      hydroCHLOROthiazide (HYDRODIURIL) 25 MG PO Tablet      torsemide (DEMADEX) 20 MG PO Tablet      Comprehensive Metabolic Panel      CBC and Differential      Microalbumin/Creat Urine Ratio      Comprehensive Metabolic Panel

## 2016-11-22 NOTE — Progress Notes
CBC and Differential      Microalbumin/Creat Urine Ratio   5. Gastroesophageal reflux disease with esophagitis K21.0 530.11 raNITIdine (ZANTAC) 150 MG PO Tablet   6. Dementia in Alzheimer's disease G30.9 331.0 donepezil (ARICEPT) 5 MG PO Tablet    F02.80 294.10    7. Urge incontinence of urine N39.41 788.31 oxybutynin (DITROPAN) 5 MG PO Tablet   8. Type 2 diabetes mellitus with diabetic polyneuropathy, with long-term current use of insulin E11.42 250.60 gabapentin (NEURONTIN) 400 MG PO Capsule    Z79.4 357.2 Comprehensive Metabolic Panel     V58.67 CBC and Differential      Hemoglobin A1c w/EAG      Microalbumin/Creat Urine Ratio      Comprehensive Metabolic Panel      CBC and Differential      Hemoglobin A1c w/EAG      Microalbumin/Creat Urine Ratio   9. Cellulitis, unspecified cellulitis site L03.90 682.9 doxycycline (MONODOX) 100 MG PO Capsule    both legs   10. Pulmonary nodule R91.1 793.11 CT Chest w Con          Plan:   Order lab  Monitor BS  doxy  CT for lung nodule  Continue current meds.  Diet and exercise as long patient can tolerate.  Salt restrictions.  FU with other MDs as scheduled.  Meds benefits and side effects discussed with patient. Patient sounds understanding.  Orders Placed This Encounter   Medications   ? pravastatin (PRAVACHOL) 40 MG PO Tablet     Sig: TAKE ONE TABLET AT       BEDTIME .[name].THANK YOU.[name].     Dispense:  90 tablet     Refill:  0     NEEDS REFILL   ? lisinopril (PRINIVIL,ZESTRIL) 10 MG PO Tablet     Sig: TAKE ONE TABLET EVERY DAY.[name].THANK YOU.[name].     Dispense:  90 tablet     Refill:  0     NEEDS REFILL   ? raNITIdine (ZANTAC) 150 MG PO Tablet     Sig: TAKE ONE TABLET TWICE A  DAY ##THANK YOU##     Dispense:  180 tablet     Refill:  0     NEEDS REFILL   ? donepezil (ARICEPT) 5 MG PO Tablet     Sig: TAKE ONE TABLET BY MOUTH AT BEDTIME .[name].THANK YOU.[name].     Dispense:  90 tablet     Refill:  0     NEEDS REFILL   ? oxybutynin (DITROPAN) 5 MG PO Tablet

## 2016-11-22 NOTE — Progress Notes
Allergen Reactions   ? Asa [Aspirin] Itching   ? Pcn [Penicillins] Itching   ? Sulfa Drugs Itching         Review of Systems  Review of Systems   Constitutional: Positive for fatigue. Negative for chills and fever.   HENT: Negative for congestion, postnasal drip, rhinorrhea and sinus pressure.    Eyes: Negative.    Respiratory: Negative for cough and wheezing.    Cardiovascular: Positive for leg swelling. Negative for chest pain and palpitations.        Both legs   Gastrointestinal: Negative.  Negative for abdominal pain, constipation, diarrhea, nausea and vomiting.   Endocrine: Negative.    Genitourinary: Negative.    Musculoskeletal: Positive for arthralgias and myalgias.   Skin: Positive for rash.        Both legs   Allergic/Immunologic: Negative.    Neurological: Positive for numbness.   Hematological: Negative.    Psychiatric/Behavioral: Positive for decreased concentration.         Objective:        VITAL SIGNS (all recorded)      Clinic Vitals       11/22/16 1343             Amb Encounter Vitals    Weight 117.6 kg (259 lb 3.2 oz)    -TD at 11/22/16 1344       Height 1.626 m (5\' 4" )    -TD at 11/22/16 1344       BMI (Calculated) 44.58    -TD at 11/22/16 1344       BSA (Calculated - sq m) 2.3    -TD at 11/22/16 1344       BP 136/74    -TD at 11/22/16 1344       BP Location Left upper arm    -TD at 11/22/16 1344       Position Sitting    -TD at 11/22/16 1344       Pulse 82    -TD at 11/22/16 1344       Pulse Source Radial    -TD at 11/22/16 1344       Resp 16    -TD at 11/22/16 1344       Temp 36.7 ?C (98.1 ?F)    -TD at 11/22/16 1344       Temperature Source Oral    -TD at 11/22/16 1344       O2 Saturation 98 %    -TD at 11/22/16 1344       FiO2 Source RA    -TD at 11/22/16 1344       Pain Score Zero    -TD at 11/22/16 1344       Education/Communication Barriers?    Learning/Communication Barriers? No    -TD at 11/22/16 1344       Fall Risk Assessment    Had recent fall / Last 6 months? No recent fall

## 2016-11-22 NOTE — Progress Notes
Reason:  Physician ordered labs  Amount:  1 marble, 1 lavender, urine Tubes  Type:  Butterfly  Site:  right Hand  Reaction:  None    Draw performed by:  [id number],  11/22/2016

## 2016-11-26 ENCOUNTER — Emergency Department: Payer: Medicare Other

## 2016-11-26 ENCOUNTER — Inpatient Hospital Stay
Admission: EM | Admit: 2016-11-26 | Discharge: 2016-11-29 | DRG: 871 | Disposition: A | Discharge status: Home Health | Payer: Medicare Other | Attending: Internal Medicine | Admitting: Internal Medicine

## 2016-11-26 ENCOUNTER — Encounter: Payer: Self-pay | Admitting: Emergency Medicine

## 2016-11-26 ENCOUNTER — Telehealth: Payer: Self-pay | Admitting: Internal Medicine

## 2016-11-26 ENCOUNTER — Other Ambulatory Visit: Payer: Self-pay

## 2016-11-26 DIAGNOSIS — Z8249 Family history of ischemic heart disease and other diseases of the circulatory system: Secondary | ICD-10-CM

## 2016-11-26 DIAGNOSIS — Z885 Allergy status to narcotic agent status: Secondary | ICD-10-CM | POA: Diagnosis not present

## 2016-11-26 DIAGNOSIS — F419 Anxiety disorder, unspecified: Secondary | ICD-10-CM | POA: Diagnosis present

## 2016-11-26 DIAGNOSIS — J45901 Unspecified asthma with (acute) exacerbation: Secondary | ICD-10-CM | POA: Diagnosis present

## 2016-11-26 DIAGNOSIS — Y95 Nosocomial condition: Secondary | ICD-10-CM | POA: Diagnosis present

## 2016-11-26 DIAGNOSIS — Z7952 Long term (current) use of systemic steroids: Secondary | ICD-10-CM

## 2016-11-26 DIAGNOSIS — F329 Major depressive disorder, single episode, unspecified: Secondary | ICD-10-CM | POA: Diagnosis present

## 2016-11-26 DIAGNOSIS — R6520 Severe sepsis without septic shock: Secondary | ICD-10-CM | POA: Diagnosis present

## 2016-11-26 DIAGNOSIS — Z881 Allergy status to other antibiotic agents status: Secondary | ICD-10-CM | POA: Diagnosis not present

## 2016-11-26 DIAGNOSIS — Z79899 Other long term (current) drug therapy: Secondary | ICD-10-CM

## 2016-11-26 DIAGNOSIS — Z825 Family history of asthma and other chronic lower respiratory diseases: Secondary | ICD-10-CM | POA: Diagnosis not present

## 2016-11-26 DIAGNOSIS — Z9981 Dependence on supplemental oxygen: Secondary | ICD-10-CM

## 2016-11-26 DIAGNOSIS — Z7982 Long term (current) use of aspirin: Secondary | ICD-10-CM | POA: Diagnosis not present

## 2016-11-26 DIAGNOSIS — E785 Hyperlipidemia, unspecified: Secondary | ICD-10-CM | POA: Diagnosis present

## 2016-11-26 DIAGNOSIS — J9621 Acute and chronic respiratory failure with hypoxia: Secondary | ICD-10-CM | POA: Diagnosis present

## 2016-11-26 DIAGNOSIS — Z79890 Hormone replacement therapy (postmenopausal): Secondary | ICD-10-CM

## 2016-11-26 DIAGNOSIS — J700 Acute pulmonary manifestations due to radiation: Secondary | ICD-10-CM | POA: Diagnosis present

## 2016-11-26 DIAGNOSIS — K219 Gastro-esophageal reflux disease without esophagitis: Secondary | ICD-10-CM | POA: Diagnosis present

## 2016-11-26 DIAGNOSIS — Z9481 Bone marrow transplant status: Secondary | ICD-10-CM | POA: Diagnosis not present

## 2016-11-26 DIAGNOSIS — Z7951 Long term (current) use of inhaled steroids: Secondary | ICD-10-CM | POA: Diagnosis not present

## 2016-11-26 DIAGNOSIS — D630 Anemia in neoplastic disease: Secondary | ICD-10-CM | POA: Diagnosis present

## 2016-11-26 DIAGNOSIS — Z66 Do not resuscitate: Secondary | ICD-10-CM | POA: Diagnosis present

## 2016-11-26 DIAGNOSIS — Z833 Family history of diabetes mellitus: Secondary | ICD-10-CM | POA: Diagnosis not present

## 2016-11-26 DIAGNOSIS — J189 Pneumonia, unspecified organism: Secondary | ICD-10-CM

## 2016-11-26 DIAGNOSIS — Z79891 Long term (current) use of opiate analgesic: Secondary | ICD-10-CM

## 2016-11-26 DIAGNOSIS — Z8572 Personal history of non-Hodgkin lymphomas: Secondary | ICD-10-CM

## 2016-11-26 DIAGNOSIS — Z9221 Personal history of antineoplastic chemotherapy: Secondary | ICD-10-CM | POA: Diagnosis not present

## 2016-11-26 DIAGNOSIS — E039 Hypothyroidism, unspecified: Secondary | ICD-10-CM | POA: Diagnosis present

## 2016-11-26 DIAGNOSIS — Y842 Radiological procedure and radiotherapy as the cause of abnormal reaction of the patient, or of later complication, without mention of misadventure at the time of the procedure: Secondary | ICD-10-CM | POA: Diagnosis present

## 2016-11-26 DIAGNOSIS — A419 Sepsis, unspecified organism: Principal | ICD-10-CM | POA: Diagnosis present

## 2016-11-26 DIAGNOSIS — Z8701 Personal history of pneumonia (recurrent): Secondary | ICD-10-CM

## 2016-11-26 LAB — CBC WITH DIFFERENTIAL/PLATELET
BASOS PCT: 0 %
Basophils Absolute: 0 10*3/uL (ref 0–0.1)
Eosinophils Absolute: 0 10*3/uL (ref 0–0.7)
Eosinophils Relative: 0 %
HEMATOCRIT: 37.9 % (ref 35.0–47.0)
Hemoglobin: 12.7 g/dL (ref 12.0–16.0)
Lymphocytes Relative: 4 %
Lymphs Abs: 0.7 10*3/uL — ABNORMAL LOW (ref 1.0–3.6)
MCH: 33.5 pg (ref 26.0–34.0)
MCHC: 33.5 g/dL (ref 32.0–36.0)
MCV: 100.2 fL — AB (ref 80.0–100.0)
MONO ABS: 0.5 10*3/uL (ref 0.2–0.9)
MONOS PCT: 3 %
NEUTROS ABS: 14.8 10*3/uL — AB (ref 1.4–6.5)
Neutrophils Relative %: 93 %
Platelets: 191 10*3/uL (ref 150–440)
RBC: 3.78 MIL/uL — ABNORMAL LOW (ref 3.80–5.20)
RDW: 16 % — AB (ref 11.5–14.5)
WBC: 16 10*3/uL — ABNORMAL HIGH (ref 3.6–11.0)

## 2016-11-26 LAB — URINALYSIS, COMPLETE (UACMP) WITH MICROSCOPIC
BILIRUBIN URINE: NEGATIVE
Glucose, UA: 50 mg/dL — AB
Hgb urine dipstick: NEGATIVE
KETONES UR: NEGATIVE mg/dL
LEUKOCYTES UA: NEGATIVE
Nitrite: NEGATIVE
PROTEIN: NEGATIVE mg/dL
Specific Gravity, Urine: 1.008 (ref 1.005–1.030)
pH: 7 (ref 5.0–8.0)

## 2016-11-26 LAB — COMPREHENSIVE METABOLIC PANEL
ALBUMIN: 3.3 g/dL — AB (ref 3.5–5.0)
ALK PHOS: 51 U/L (ref 38–126)
ALT: 12 U/L — ABNORMAL LOW (ref 14–54)
AST: 24 U/L (ref 15–41)
Anion gap: 7 (ref 5–15)
BILIRUBIN TOTAL: 1 mg/dL (ref 0.3–1.2)
BUN: 30 mg/dL — AB (ref 6–20)
CALCIUM: 8.9 mg/dL (ref 8.9–10.3)
CO2: 28 mmol/L (ref 22–32)
Chloride: 102 mmol/L (ref 101–111)
Creatinine, Ser: 1.06 mg/dL — ABNORMAL HIGH (ref 0.44–1.00)
GFR calc Af Amer: 59 mL/min — ABNORMAL LOW (ref >60)
GFR calc non Af Amer: 51 mL/min — ABNORMAL LOW (ref >60)
GLUCOSE: 96 mg/dL (ref 65–99)
POTASSIUM: 3.6 mmol/L (ref 3.5–5.1)
Sodium: 137 mmol/L (ref 135–145)
TOTAL PROTEIN: 6.7 g/dL (ref 6.5–8.1)

## 2016-11-26 LAB — INFLUENZA PANEL BY PCR (TYPE A & B)
INFLAPCR: NEGATIVE
INFLBPCR: NEGATIVE

## 2016-11-26 LAB — PROTIME-INR
INR: 1
Prothrombin Time: 13.2 seconds (ref 11.4–15.2)

## 2016-11-26 LAB — MRSA PCR SCREENING: MRSA by PCR: NEGATIVE

## 2016-11-26 LAB — LACTIC ACID, PLASMA: Lactic Acid, Venous: 1.6 mmol/L (ref 0.5–1.9)

## 2016-11-26 MED ORDER — METHYLPREDNISOLONE SODIUM SUCC 40 MG IJ SOLR
40.0000 mg | Freq: Four times a day (QID) | INTRAMUSCULAR | Status: DC
  Administered 2016-11-26 – 2016-11-28 (×7): 40 mg via INTRAVENOUS
  Filled 2016-11-26 (×7): qty 1

## 2016-11-26 MED ORDER — LEVOTHYROXINE SODIUM 25 MCG PO TABS
25.0000 ug | ORAL_TABLET | Freq: Every day | ORAL | Status: DC
  Administered 2016-11-27 – 2016-11-29 (×3): 25 ug via ORAL
  Filled 2016-11-26 (×3): qty 1

## 2016-11-26 MED ORDER — DEXTROSE 5 % IV SOLN
2.0000 g | INTRAVENOUS | Status: DC
  Administered 2016-11-27: 2 g via INTRAVENOUS
  Filled 2016-11-26: qty 2

## 2016-11-26 MED ORDER — ALPRAZOLAM 0.5 MG PO TABS
0.5000 mg | ORAL_TABLET | Freq: Every evening | ORAL | Status: DC | PRN
  Administered 2016-11-27 – 2016-11-28 (×2): 0.5 mg via ORAL
  Filled 2016-11-26 (×2): qty 1

## 2016-11-26 MED ORDER — ASPIRIN EC 81 MG PO TBEC
81.0000 mg | DELAYED_RELEASE_TABLET | Freq: Every day | ORAL | Status: DC
  Administered 2016-11-26 – 2016-11-29 (×4): 81 mg via ORAL
  Filled 2016-11-26 (×4): qty 1

## 2016-11-26 MED ORDER — ENOXAPARIN SODIUM 40 MG/0.4ML ~~LOC~~ SOLN
40.0000 mg | SUBCUTANEOUS | Status: DC
  Administered 2016-11-26: 20:00:00 40 mg via SUBCUTANEOUS
  Filled 2016-11-26: qty 0.4

## 2016-11-26 MED ORDER — ONDANSETRON HCL 4 MG/2ML IJ SOLN: 4.0000 mg | Freq: Four times a day (QID) | INTRAMUSCULAR | Status: DC | PRN

## 2016-11-26 MED ORDER — PANTOPRAZOLE SODIUM 40 MG PO TBEC
40.0000 mg | DELAYED_RELEASE_TABLET | Freq: Every day | ORAL | Status: DC
  Administered 2016-11-26 – 2016-11-29 (×4): 40 mg via ORAL
  Filled 2016-11-26 (×4): qty 1

## 2016-11-26 MED ORDER — VANCOMYCIN HCL IN DEXTROSE 750-5 MG/150ML-% IV SOLN
750.0000 mg | INTRAVENOUS | Status: DC
  Filled 2016-11-26: qty 150

## 2016-11-26 MED ORDER — DM-GUAIFENESIN ER 30-600 MG PO TB12
1.0000 | ORAL_TABLET | Freq: Every day | ORAL | Status: DC | PRN
  Filled 2016-11-26 (×2): qty 1

## 2016-11-26 MED ORDER — ONDANSETRON HCL 4 MG/2ML IJ SOLN
4.0000 mg | Freq: Once | INTRAMUSCULAR | Status: AC
Start: 2016-11-26 — End: 2016-11-26
  Administered 2016-11-26: 4 mg via INTRAVENOUS

## 2016-11-26 MED ORDER — ACETAMINOPHEN 325 MG PO TABS: 650.0000 mg | ORAL_TABLET | Freq: Four times a day (QID) | ORAL | Status: DC | PRN

## 2016-11-26 MED ORDER — VANCOMYCIN HCL IN DEXTROSE 1-5 GM/200ML-% IV SOLN
1000.0000 mg | Freq: Once | INTRAVENOUS | Status: AC
  Administered 2016-11-26: 1000 mg via INTRAVENOUS
  Filled 2016-11-26: qty 200

## 2016-11-26 MED ORDER — IPRATROPIUM-ALBUTEROL 0.5-2.5 (3) MG/3ML IN SOLN
3.0000 mL | RESPIRATORY_TRACT | Status: DC
  Administered 2016-11-26 – 2016-11-29 (×17): 3 mL via RESPIRATORY_TRACT
  Filled 2016-11-26 (×16): qty 3

## 2016-11-26 MED ORDER — SODIUM CHLORIDE 0.9 % IV BOLUS (SEPSIS)
250.0000 mL | Freq: Once | INTRAVENOUS | Status: AC
  Administered 2016-11-26: 250 mL via INTRAVENOUS

## 2016-11-26 MED ORDER — SODIUM CHLORIDE 0.9 % IV BOLUS (SEPSIS)
500.0000 mL | Freq: Once | INTRAVENOUS | Status: AC
  Administered 2016-11-26: 500 mL via INTRAVENOUS

## 2016-11-26 MED ORDER — METHYLPREDNISOLONE SODIUM SUCC 125 MG IJ SOLR
125.0000 mg | Freq: Once | INTRAMUSCULAR | Status: AC
  Administered 2016-11-26: 125 mg via INTRAVENOUS
  Filled 2016-11-26: qty 2

## 2016-11-26 MED ORDER — BUDESONIDE 0.5 MG/2ML IN SUSP
0.5000 mg | Freq: Two times a day (BID) | RESPIRATORY_TRACT | Status: DC
  Administered 2016-11-26 – 2016-11-29 (×6): 0.5 mg via RESPIRATORY_TRACT
  Filled 2016-11-26 (×6): qty 2

## 2016-11-26 MED ORDER — ONDANSETRON HCL 4 MG PO TABS: 4.0000 mg | ORAL_TABLET | Freq: Four times a day (QID) | ORAL | Status: DC | PRN

## 2016-11-26 MED ORDER — ACETAMINOPHEN 500 MG PO TABS
1000.0000 mg | ORAL_TABLET | Freq: Once | ORAL | Status: AC
  Administered 2016-11-26: 1000 mg via ORAL
  Filled 2016-11-26: qty 2

## 2016-11-26 MED ORDER — CEFEPIME-DEXTROSE 1 GM/50ML IV SOLR
1.0000 g | Freq: Once | INTRAVENOUS | Status: AC
  Administered 2016-11-26: 1 g via INTRAVENOUS
  Filled 2016-11-26: qty 50

## 2016-11-26 MED ORDER — ACETAMINOPHEN 650 MG RE SUPP: 650.0000 mg | Freq: Four times a day (QID) | RECTAL | Status: DC | PRN

## 2016-11-26 MED ORDER — IPRATROPIUM-ALBUTEROL 0.5-2.5 (3) MG/3ML IN SOLN
3.0000 mL | Freq: Once | RESPIRATORY_TRACT | Status: AC
Start: 2016-11-26 — End: 2016-11-26
  Administered 2016-11-26: 3 mL via RESPIRATORY_TRACT
  Filled 2016-11-26: qty 3

## 2016-11-26 MED ORDER — IPRATROPIUM-ALBUTEROL 0.5-2.5 (3) MG/3ML IN SOLN
3.0000 mL | Freq: Once | RESPIRATORY_TRACT | Status: AC
  Administered 2016-11-26: 3 mL via RESPIRATORY_TRACT
  Filled 2016-11-26: qty 3

## 2016-11-26 MED ORDER — SODIUM CHLORIDE 0.9 % IV BOLUS (SEPSIS)
1000.0000 mL | Freq: Once | INTRAVENOUS | Status: AC
Start: 2016-11-26 — End: 2016-11-26
  Administered 2016-11-26: 1000 mL via INTRAVENOUS

## 2016-11-26 MED ORDER — GUAIFENESIN-CODEINE 100-10 MG/5ML PO SOLN
5.0000 mL | ORAL | Status: DC | PRN
  Administered 2016-11-28 – 2016-11-29 (×3): 5 mL via ORAL
  Filled 2016-11-26 (×3): qty 5

## 2016-11-26 MED ORDER — NYSTATIN 100000 UNIT/ML MT SUSP
5.0000 mL | Freq: Four times a day (QID) | OROMUCOSAL | Status: DC
  Administered 2016-11-26 – 2016-11-29 (×10): 500000 [IU] via ORAL
  Filled 2016-11-26 (×10): qty 5

## 2016-11-26 MED ORDER — ONDANSETRON HCL 4 MG/2ML IJ SOLN
INTRAMUSCULAR | Status: AC
  Administered 2016-11-26: 4 mg via INTRAVENOUS
  Filled 2016-11-26: qty 2

## 2016-11-26 MED ORDER — VITAMIN C 500 MG PO TABS
500.0000 mg | ORAL_TABLET | Freq: Every day | ORAL | Status: DC
  Administered 2016-11-27 – 2016-11-29 (×3): 500 mg via ORAL
  Filled 2016-11-26 (×3): qty 1

## 2016-11-26 MED ORDER — AZITHROMYCIN 500 MG PO TABS
500.0000 mg | ORAL_TABLET | Freq: Once | ORAL | Status: AC
  Administered 2016-11-26: 500 mg via ORAL
  Filled 2016-11-26: qty 1

## 2016-11-26 MED ORDER — CITALOPRAM HYDROBROMIDE 20 MG PO TABS
20.0000 mg | ORAL_TABLET | Freq: Every day | ORAL | Status: DC
  Administered 2016-11-27 – 2016-11-29 (×3): 20 mg via ORAL
  Filled 2016-11-26 (×3): qty 1

## 2016-11-26 MED ORDER — POTASSIUM CHLORIDE CRYS ER 20 MEQ PO TBCR
20.0000 meq | EXTENDED_RELEASE_TABLET | Freq: Two times a day (BID) | ORAL | Status: DC
  Administered 2016-11-26 – 2016-11-29 (×6): 20 meq via ORAL
  Filled 2016-11-26 (×6): qty 1

## 2016-11-26 NOTE — ED Notes (Signed)
Pt returned from xray

## 2016-11-26 NOTE — Telephone Encounter (Signed)
Spoke with husband and states pt was unresponsive this morning but he didn't call 911. States she has done this 3 times since starting Perforomist and Pulmicort early April by DK. States pt is responding now but complains of LT shoulder soreness and placed a heating pad on it. Pt is also on Prednisone 20mg  daily cont O2. Denies CP or chest tightness. Has NP wet cough and complains of shivering. Pt was at [facility] 11/15/16 for dehydration, generalized weakness and fever. Husband states none of this unresponsiveness started until she started on the daily neb medications. Please advise.

## 2016-11-26 NOTE — Progress Notes (Signed)
Pharmacy Antibiotic Note  Anna Mcbride is a 71 y.o. female admitted on 11/26/2016 with sepsis.  Pharmacy has been consulted for cefepime and vancomycin dosing.  Plan: 1. Cefepime 2 gm IV Q24H 2. Vancomycin 1 gm IV x 1 in ED followed by vancomycin 750 mg IV Q24H, predicted trough 17 mcg/mL, pharmacy will continue to follow and adjust as needed to maintain trough 15 to 20 mcg/mL.   Vd 35.8 L, Ke 0.034 hr-1, T1/2 20.6 hr  Weight: 125 lb 14.4 oz (57.1 kg)  Temp (24hrs), Avg:100.9 F (38.3 C), Min:100.9 F (38.3 C), Max:100.9 F (38.3 C)   Recent Labs Lab 11/21/16 0928 11/26/16 1257  WBC 10.3 16.0*  CREATININE 0.98 1.06*  LATICACIDVEN  --  1.6    Estimated Creatinine Clearance: 35.7 mL/min (A) (by C-G formula based on SCr of 1.06 mg/dL (H)).    Allergies  Allergen Reactions  . Hydrocodone-Homatropine Itching  . Tussionex Pennkinetic Er [Hydrocod Polst-Cpm Polst Er] Itching  . Ciprofloxacin Rash   Thank you for allowing pharmacy to be a part of this patient's care.  Laural Benes, Pharm.D., BCPS Clinical Pharmacist 11/26/2016 2:57 PM

## 2016-11-26 NOTE — Telephone Encounter (Signed)
LMOM for husband to return call.

## 2016-11-26 NOTE — ED Notes (Signed)
Dr. Verdell Carmine notified of pt's systolic BP running in the upper 80's to 90's. Nurse instructed to start another 59ml NS bolus.

## 2016-11-26 NOTE — ED Triage Notes (Signed)
Pt beginning with lethergy today. Pt stating weakness and fall onto left shoulder today. Pt denies hitting her head. Pt wears 2L O2 at home. Pt was recently treated for infection here and has been on antibiotics since departure. [facility] stating that she has been "sleepy" per family. Pt has hx of lung ca 17 years ago. Pt was also tx for dehydration on 11/17/2016. Denying n/v, SOB, and CP

## 2016-11-26 NOTE — Telephone Encounter (Signed)
Per DS bring pt in 11/27/16 at 11:30am and if she gets unresponsive or worse he needs to call 911.

## 2016-11-26 NOTE — ED Notes (Signed)
Nurse assisted pt to the restroom. Pt tolerated well. Pt is steady with one assist. Pt was able to void. Pt in NAD at this time. Pt thanking nurse at this time stating that she is feeling better than she did earlier. Pt's color is WNL and pt appears to be doing better at this time.

## 2016-11-26 NOTE — ED Notes (Signed)
Pt's husband was contacted and is aware that pt will be going to the floor.

## 2016-11-26 NOTE — ED Provider Notes (Signed)
[facility] Emergency Department Provider Note  ____________________________________________  Time seen: Approximately 1:36 PM  I have reviewed the triage vital signs and the nursing notes.   HISTORY  Chief Complaint Weakness   HPI Anna Mcbride is a 71 y.o. 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 Patient was well and in her usual state of health until yesterday evening when she went to bed. This morning she was complaining of generalized fatigue, body aches, fever, chills. She has had a productive cough and wheezing. She has been using her inhalers. Also complaining of shortness of breath. Patient reports that she will roll out of bed this past night and hit her left shoulder on the floor. She is complaining of constant mild pain in that shoulder since that accident which is worse with movement. She denies head trauma or LOC. Her most recent hospital admission was in the end of February. No diarrhea, no vomiting, no dysuria, no hematuria, no chest pain, no abdominal pain, no headache, no rash, no neck stiffness.  Past Medical History:  Diagnosis Date  . Acute respiratory failure ([place]) 11/29/2014  . 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 Asthma   . Non Hodgkin's lymphoma ([place]) [id number]  . Personal history of chemotherapy     Patient Active Problem List   Diagnosis Date Noted  . Sepsis ([place]) 09/12/2016  . Pneumothorax   . 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 Recurrent upper respiratory infection (URI) 09/01/2015  . Acute respiratory failure with hypoxia ([place])   . 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 SOB (shortness of breath)   . Pneumonia   . Acute respiratory failure ([place]) 11/29/2014  . History of digestive disease 11/29/2014  . HLD (hyperlipidemia) 11/29/2014  . Adult hypothyroidism  11/29/2014  . Lymphoma ([place]) 11/29/2014  . RAD (reactive airway disease) 11/29/2014  . 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 Pain in shoulder 11/01/2014  . Fracture of humerus, proximal 11/01/2014  . Febrile neutropenia ([place]) 11/08/2013  . Decreased potassium in the blood 09/02/2013  . Acquired hypothyroidism 07/01/2013  . Lymphoma, non-Hodgkin's ([place]) 06/30/2013  . Cervical lymphadenopathy 04/20/2013  . History of lymphoma 04/20/2013  . Dyspnea 01/29/2011  . Cough 01/29/2011    Past Surgical History:  Procedure Laterality Date  . 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 NASAL SINUS SURGERY  08/07/05 09/09/05   x 2   . VIDEO BRONCHOSCOPY Bilateral 11/30/2014   Procedure: VIDEO BRONCHOSCOPY WITHOUT FLUORO;  Surgeon: Flora Lipps, MD;  Location: [facility] ORS;  Service: Cardiopulmonary;  Laterality: Bilateral;    Prior to Admission medications   Medication Sig Start Date End Date Taking? Authorizing Provider  albuterol (PROVENTIL HFA;VENTOLIN HFA) 108 (90 BASE) MCG/ACT inhaler Inhale 1 puff into the lungs every 6 (six) hours as needed for wheezing or shortness of breath.   Yes [provider]  albuterol (PROVENTIL) (2.5 MG/3ML) 0.083% nebulizer solution Take 3 mLs (2.5 mg total) by nebulization every 4 (four) hours as needed for wheezing or shortness of breath. 05/21/16  Yes Kasa, Maretta Bees, MD  ALPRAZolam Duanne Moron) 0.5 MG tablet Take 1 tablet (0.5 mg total) by mouth at bedtime as needed for sleep. 09/24/16  Yes Flora Lipps, MD  guaiFENesin-codeine 100-10 MG/5ML syrup Take 5 mLs by mouth every 4 (four) hours as needed for cough. 09/23/16  Yes Flora Lipps, MD  AMBULATORY NON FORMULARY MEDICATION Medication Name: Incentive Spirometer Use 10-15 times daily 07/29/16   Flora Lipps, MD  aspirin EC 81 MG tablet Take 81 mg by mouth daily.    [provider]  budesonide (PULMICORT) 0.5 MG/2ML nebulizer solution Take 2 mLs (0.5 mg total) by nebulization 2 (two) times daily. 11/07/16    Flora Lipps, MD  citalopram (CELEXA) 20 MG tablet TAKE 1 TABLET BY MOUTH ONCE DAILY 06/07/16   Cammie Sickle, MD  dextromethorphan-guaiFENesin [facility] DM) 30-600 MG 12hr tablet Take 1 tablet by mouth daily.    [provider]  formoterol (PERFOROMIST) 20 MCG/2ML nebulizer solution Take 2 mLs (20 mcg total) by nebulization 2 (two) times daily. 11/07/16   Flora Lipps, MD  levothyroxine (SYNTHROID, LEVOTHROID) 25 MCG tablet TAKE 1 TABLET BY MOUTH ONCE DAILY ON AN EMPTY STOMACH. WAIT 30 MINUTES BEFORE TAKING OTHER MEDS. 08/19/16   Cammie Sickle, MD  nystatin (MYCOSTATIN) 100000 UNIT/ML suspension Take 5 mLs (500,000 Units total) by mouth 4 (four) times daily. 11/21/16   Cammie Sickle, MD  omeprazole (PRILOSEC) 20 MG capsule Take 1 capsule (20 mg total) by mouth 2 (two) times daily before a meal. Patient taking differently: Take 20 mg by mouth daily.  03/05/16   Cammie Sickle, MD  ondansetron (ZOFRAN) 4 MG tablet Take 1 tablet (4 mg total) by mouth every 6 (six) hours as needed for nausea or vomiting. 11/07/16   Flora Lipps, MD  potassium chloride SA (K-DUR,KLOR-CON) 20 MEQ tablet Take 1 tablet (20 mEq total) by mouth 2 (two) times daily. 11/21/16   Cammie Sickle, MD  predniSONE (DELTASONE) 20 MG tablet 40 mg daily x 10 days then 20 mg daily everyday Patient taking differently: Take 20 mg by mouth daily with breakfast. 40 mg daily x 10 days then 20 mg daily everyday 11/07/16   Flora Lipps, MD  Respiratory Therapy Supplies (FLUTTER) DEVI Use 10-15 times daily 07/29/16   Flora Lipps, MD  vitamin C (ASCORBIC ACID) 500 MG tablet Take 500 mg by mouth daily.    [provider]    Allergies Hydrocodone-homatropine; Tussionex pennkinetic er [employer] polst-cpm polst er]; and Ciprofloxacin  Family History  Problem Relation Age of Onset  . Asthma Son   . Congestive Heart Failure Mother   . Diabetes Brother   . Breast cancer Neg Hx     Social  History Social History  Substance Use Topics  . Smoking status: Never Smoker  . Smokeless tobacco: Never Used  . Alcohol use Yes    Review of Systems  Constitutional: + fever, chills, body aches, generalized weakness Eyes: Negative for visual changes. ENT: Negative for sore throat. Neck: No neck pain  Cardiovascular: Negative for chest pain. Respiratory: + shortness of breath, wheezing, cough Gastrointestinal: Negative for abdominal pain, vomiting or diarrhea. Genitourinary: Negative for dysuria. Musculoskeletal: Negative for back pain. Skin: Negative for rash. Neurological: Negative for headaches, weakness or numbness. Psych: No SI or HI  ____________________________________________   PHYSICAL EXAM:  VITAL SIGNS: ED Triage Vitals  Enc Vitals Group     BP 11/26/16 1252 94/75     Pulse Rate 11/26/16 1252 (!) 110     Resp 11/26/16 1252 (!) 22     Temp 11/26/16 1252 (!) 100.9 F (38.3 C)     Temp Source 11/26/16 1252 Oral     SpO2 11/26/16 1241 96 %     Weight 11/26/16 1253 125 lb 14.4 oz (57.1 kg)  Height --      Head Circumference --      Peak Flow --      Pain Score --      Pain Loc --      Pain Edu? --      Excl. in [place]? --     Constitutional: Alert and oriented, Looks uncomfortable with audible wheezing.  HEENT:      Head: Normocephalic and atraumatic.         Eyes: Conjunctivae are normal. Sclera is non-icteric. EOMI. PERRL      Mouth/Throat: Mucous membranes are dry.       Neck: Supple with no signs of meningismus. Cardiovascular: Tachycardic with regular rhythm. No murmurs, gallops, or rubs. 2+ symmetrical distal pulses are present in all extremities. No JVD. Respiratory: Mildly increased work of breathing, normal sats on 2 L nasal cannula, diffuse wheezing and rhonchi Gastrointestinal: Soft, non tender, and non distended with positive bowel sounds. No rebound or guarding. Genitourinary: No CVA tenderness. Musculoskeletal: Mild ttp over the anterior  humeral head. Nontender with normal range of motion in all extremities. No edema, cyanosis, or erythema of extremities. Neurologic: Normal speech and language. Face is symmetric. Moving all extremities. No gross focal neurologic deficits are appreciated. Skin: Skin is warm, dry and intact. No rash noted. Psychiatric: Mood and affect are normal. Speech and behavior are normal.  ____________________________________________   LABS (all labs ordered are listed, but only abnormal results are displayed)  Labs Reviewed  COMPREHENSIVE METABOLIC PANEL - Abnormal; Notable for the following:       Result Value   BUN 30 (*)    Creatinine, Ser 1.06 (*)    Albumin 3.3 (*)    ALT 12 (*)    GFR calc non Af Amer 51 (*)    GFR calc Af Amer 59 (*)    All other components within normal limits  CBC WITH DIFFERENTIAL/PLATELET - Abnormal; Notable for the following:    WBC 16.0 (*)    RBC 3.78 (*)    MCV 100.2 (*)    RDW 16.0 (*)    Neutro Abs 14.8 (*)    Lymphs Abs 0.7 (*)    All other components within normal limits  CULTURE, BLOOD (ROUTINE X 2)  CULTURE, BLOOD (ROUTINE X 2)  LACTIC ACID, PLASMA  PROTIME-INR  LACTIC ACID, PLASMA  URINALYSIS, COMPLETE (UACMP) WITH MICROSCOPIC  INFLUENZA PANEL BY PCR (TYPE A & B)   ____________________________________________  EKG  ED ECG REPORT I, Rudene Re, the attending physician, personally viewed and interpreted this ECG.  Sinus tachycardia, rate of 112, normal intervals, normal axis, no ST elevations or depressions.  ____________________________________________  RADIOLOGY  CXR: 1. New left lower lobe pneumonia. 2. Postoperative changes of the right lung. 3. Mild pulmonary vascular congestion without cardiomegaly. 4. Left subclavian Port-A-Cath is stable. __________________________________________   PROCEDURES  Procedure(s) performed: None Procedures Critical Care performed: yes  CRITICAL CARE Performed by: Rudene Re  ?  Total critical care time: 35 min  Critical care time was exclusive of separately billable procedures and treating other patients.  Critical care was necessary to treat or prevent imminent or life-threatening deterioration.  Critical care was time spent personally by me on the following activities: development of treatment plan with patient and/or surrogate as well as nursing, discussions with consultants, evaluation of patient's response to treatment, examination of patient, obtaining history from patient or surrogate, ordering and performing treatments and interventions, ordering and review of laboratory studies, ordering  and review of radiographic studies, pulse oximetry and re-evaluation of patient's condition.  ____________________________________________   INITIAL IMPRESSION / ASSESSMENT AND PLAN / ED COURSE   71 y.o. 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 Upon arrival to the emergency room patient meets sepsis criteria with tachycardia, fever, tachypnea, and blood pressure of 94/75. Sepsis protocol initiated. Since patient has been hospitalized over the course of the last 3 months she was given cefepime, vein, and azithromycin. IV fluids. By mouth Tylenol. DuoNeb and steroids.    _________________________ 2:02 PM on 11/26/2016 -----------------------------------------  CXR concerning for new LLL PNA. Leukocytosis with WBC 16K. Will admit to Hospitalist.   Pertinent labs & imaging results that were available during my care of the patient were reviewed by me and considered in my medical decision making (see chart for details).    ____________________________________________   FINAL CLINICAL IMPRESSION(S) / ED DIAGNOSES  Final diagnoses:  Sepsis, due to  unspecified organism ([place])  Healthcare-associated pneumonia      NEW MEDICATIONS STARTED DURING THIS VISIT:  New Prescriptions   No medications on file     Note:  This document was prepared using Dragon voice recognition software and may include unintentional dictation errors.    Alfred Levins, [place], MD 11/26/16 [id number]

## 2016-11-26 NOTE — Telephone Encounter (Signed)
LMOM for husband to return call if he needs to otherwise I will inform DS pt is on her way to ER.

## 2016-11-26 NOTE — Telephone Encounter (Signed)
Spoke with husband and informed him per DS to come in tomorrow at 1130 am to see DS and if pt gets unresponsive or worse between now and tomorrow to call 911. Husband verbalized understanding. Nothing further needed.

## 2016-11-26 NOTE — Telephone Encounter (Signed)
Pt spouse calling to let us know patient is now being taken to [facility]  Please advise

## 2016-11-26 NOTE — ED Notes (Signed)
Admitting doctor at the bedside 

## 2016-11-26 NOTE — Telephone Encounter (Signed)
Pt spouse calling stating he is very upset  Pt was unresponsive this morning This morning she woke up around 4 am she was freezing, left shoulder was hurting but complained of being freezing He states she's been in and out of ED 3 times in the last six weeks He is upset she's on three different  breathing treatments and nothing seems to be helping  Would like a call back asap

## 2016-11-26 NOTE — H&P (Signed)
[facility] [place] NAME: Anna Mcbride    MR#:  [id number]  DATE OF BIRTH:  October 21, 1945  DATE OF ADMISSION:  11/26/2016  PRIMARY CARE PHYSICIAN: Madelyn Brunner, MD   REQUESTING/REFERRING PHYSICIAN: Dr. Rudene Re  CHIEF COMPLAINT:   Chief Complaint  Patient presents with  . Weakness    HISTORY OF PRESENT ILLNESS:  Anna Mcbride  is a 71 y.o. 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 Patient says that she has not been feeling well for the past few days. She has a cough which is intermittently productive. She admits to right nose and chills and low-grade fever at home for 100.6. She also admits to chronic shortness of breath on exertion, although it has gotten somewhat worse the past few days. She presented to the emergency room if she is not improving and was noted to be febrile with a fever 101, she was tachycardic, had a leukocytosis and chest x-ray findings were suggestive of pneumonia. Hospitalist services were contacted further treatment and evaluation.  PAST MEDICAL HISTORY:   Past Medical History:  Diagnosis Date  . Acute respiratory failure ([place]) 11/29/2014  . 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 Asthma   . Non Hodgkin's lymphoma ([place]) [id number]  . Personal history of chemotherapy     PAST SURGICAL HISTORY:   Past Surgical History:  Procedure Laterality Date  . 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 NASAL SINUS SURGERY  08/07/05 09/09/05   x 2   . VIDEO BRONCHOSCOPY Bilateral 11/30/2014   Procedure: VIDEO BRONCHOSCOPY WITHOUT FLUORO;  Surgeon: Flora Lipps, MD;  Location: [facility] ORS;  Service: Cardiopulmonary;  Laterality: Bilateral;    SOCIAL HISTORY:   Social History  Substance Use Topics   . Smoking status: Never Smoker  . Smokeless tobacco: Never Used  . Alcohol use Yes    FAMILY HISTORY:   Family History  Problem Relation Age of Onset  . Asthma Son   . Congestive Heart Failure Mother   . Diabetes Brother   . Pulmonary embolism Father   . Breast cancer Neg Hx     DRUG ALLERGIES:   Allergies  Allergen Reactions  . Hydrocodone-Homatropine Itching  . Tussionex Pennkinetic Er [Hydrocod Polst-Cpm Polst Er] Itching  . Ciprofloxacin Rash    REVIEW OF SYSTEMS:   Review of Systems  Constitutional: Negative for fever and weight loss.  HENT: Negative for congestion, nosebleeds and tinnitus.   Eyes: Negative for blurred vision, double vision and redness.  Respiratory: Positive for cough and shortness of breath. Negative for hemoptysis.   Cardiovascular: Negative for chest pain, orthopnea, leg swelling and PND.  Gastrointestinal: Negative for abdominal pain, diarrhea, melena, nausea and vomiting.  Genitourinary: Negative for dysuria, hematuria and urgency.  Musculoskeletal: Positive for falls. Negative for joint pain.  Neurological: Positive for weakness. Negative for dizziness, tingling, sensory change, focal weakness, seizures and headaches.  Endo/Heme/Allergies: Negative for polydipsia. Does not bruise/bleed easily.  Psychiatric/Behavioral: Negative for depression and memory loss. The patient is not nervous/anxious.     MEDICATIONS AT HOME:   Prior to Admission medications   Medication Sig Start Date End Date Taking? Authorizing Provider  albuterol (PROVENTIL HFA;VENTOLIN HFA) 108 (90 BASE) MCG/ACT inhaler Inhale 1 puff into the lungs every 6 (six)  hours as needed for wheezing or shortness of breath.   Yes [provider]  albuterol (PROVENTIL) (2.5 MG/3ML) 0.083% nebulizer solution Take 3 mLs (2.5 mg total) by nebulization every 4 (four) hours as needed for wheezing or shortness of breath. 05/21/16  Yes Kasa, Maretta Bees, MD  ALPRAZolam Duanne Moron) 0.5 MG  tablet Take 1 tablet (0.5 mg total) by mouth at bedtime as needed for sleep. 09/24/16  Yes Kasa, Maretta Bees, MD  budesonide (PULMICORT) 0.5 MG/2ML nebulizer solution Take 2 mLs (0.5 mg total) by nebulization 2 (two) times daily. 11/07/16  Yes Kasa, Maretta Bees, MD  citalopram (CELEXA) 20 MG tablet TAKE 1 TABLET BY MOUTH ONCE DAILY 06/07/16  Yes Cammie Sickle, MD  dextromethorphan-guaiFENesin (MUCINEX DM) 30-600 MG 12hr tablet Take 1 tablet by mouth daily.   Yes [provider]  formoterol (PERFOROMIST) 20 MCG/2ML nebulizer solution Take 2 mLs (20 mcg total) by nebulization 2 (two) times daily. 11/07/16  Yes Flora Lipps, MD  guaiFENesin-codeine 100-10 MG/5ML syrup Take 5 mLs by mouth every 4 (four) hours as needed for cough. 09/23/16  Yes Kasa, Maretta Bees, MD  levothyroxine (SYNTHROID, LEVOTHROID) 25 MCG tablet TAKE 1 TABLET BY MOUTH ONCE DAILY ON AN EMPTY STOMACH. WAIT 30 MINUTES BEFORE TAKING OTHER MEDS. 08/19/16  Yes Cammie Sickle, MD  nystatin (MYCOSTATIN) 100000 UNIT/ML suspension Take 5 mLs (500,000 Units total) by mouth 4 (four) times daily. 11/21/16  Yes Cammie Sickle, MD  omeprazole (PRILOSEC) 20 MG capsule Take 1 capsule (20 mg total) by mouth 2 (two) times daily before a meal. Patient taking differently: Take 20 mg by mouth daily.  03/05/16  Yes Cammie Sickle, MD  ondansetron (ZOFRAN) 4 MG tablet Take 1 tablet (4 mg total) by mouth every 6 (six) hours as needed for nausea or vomiting. 11/07/16  Yes Flora Lipps, MD  potassium chloride SA (K-DUR,KLOR-CON) 20 MEQ tablet Take 1 tablet (20 mEq total) by mouth 2 (two) times daily. 11/21/16  Yes Cammie Sickle, MD  vitamin C (ASCORBIC ACID) 500 MG tablet Take 500 mg by mouth daily.   Yes [provider]  AMBULATORY NON FORMULARY MEDICATION Medication Name: Incentive Spirometer Use 10-15 times daily 07/29/16   Flora Lipps, MD  aspirin EC 81 MG tablet Take 81 mg by mouth daily.    [provider]  predniSONE  (DELTASONE) 20 MG tablet 40 mg daily x 10 days then 20 mg daily everyday Patient not taking: Reported on 11/26/2016 11/07/16   Flora Lipps, MD  Respiratory Therapy Supplies (FLUTTER) DEVI Use 10-15 times daily 07/29/16   Flora Lipps, MD      VITAL SIGNS:  Blood pressure (!) 142/68, pulse (!) 116, temperature (!) 100.9 F (38.3 C), temperature source Oral, resp. rate (!) 29, weight 57.1 kg (125 lb 14.4 oz), SpO2 100 %.  PHYSICAL EXAMINATION:  Physical Exam  GENERAL:  71 y.o.-year-old patient lying in bed in no acute distress.  EYES: Pupils equal, round, reactive to light and accommodation. No scleral icterus. Extraocular muscles intact.  HEENT: Head atraumatic, normocephalic. Oropharynx and nasopharynx clear. No oropharyngeal erythema, moist oral mucosa  NECK:  Supple, no jugular venous distention. No thyroid enlargement, no tenderness.  LUNGS: good A/E B.l, diffuse wheezing, rhonchi b/l no rales. No use of accessory muscles of respiration.  CARDIOVASCULAR: S1, S2 RRR, Tachycardic. No murmurs, rubs, gallops, clicks.  ABDOMEN: Soft, nontender, nondistended. Bowel sounds present. No organomegaly or mass.  EXTREMITIES: No pedal edema, cyanosis, or clubbing. + 2 pedal & radial  pulses b/l.   NEUROLOGIC: Cranial nerves II through XII are intact. No focal Motor or sensory deficits appreciated b/l.   PSYCHIATRIC: The patient is alert and oriented x 3. SKIN: No obvious rash, lesion, or ulcer.   LABORATORY PANEL:   CBC  Recent Labs Lab 11/26/16 1257  WBC 16.0*  HGB 12.7  HCT 37.9  PLT 191   ------------------------------------------------------------------------------------------------------------------  Chemistries   Recent Labs Lab 11/26/16 1257  NA 137  K 3.6  CL 102  CO2 28  GLUCOSE 96  BUN 30*  CREATININE 1.06*  CALCIUM 8.9  AST 24  ALT 12*  ALKPHOS 51  BILITOT 1.0    ------------------------------------------------------------------------------------------------------------------  Cardiac Enzymes No results for input(s): TROPONINI in the last 168 hours. ------------------------------------------------------------------------------------------------------------------  RADIOLOGY:  Dg Chest 2 View  Result Date: 11/26/2016 CLINICAL DATA:  Cough. Weakness. Fell on left shoulder today. Remote history of lung cancer. Recent treatment with antibiotics. EXAM: CHEST  2 VIEW COMPARISON:  Two-view chest x-ray 11/15/2016. FINDINGS: The heart size is normal. The left subclavian Port-A-Cath is stable in position. Volume loss is again noted in the right lung following previous surgery and radiation. New medial left lower lobe pneumonia is present. There is mild pulmonary vascular congestion bilaterally. IMPRESSION: 1. New left lower lobe pneumonia. 2. Postoperative changes of the right lung. 3. Mild pulmonary vascular congestion without cardiomegaly. 4. Left subclavian Port-A-Cath is stable. Electronically Signed   By: San Morelle M.D.   On: 11/26/2016 13:31     IMPRESSION AND PLAN:   71 year old female with past medical history of non-Hodgkin's lymphoma in remission, radiation pneumonitis, anemia of chronic disease, who presented to the hospital due to weakness, cough and shortness of breath.  1. Sepsis-patient meets criteria given fever, tachycardia, leukocytosis and chest x-ray findings suggestive of pneumonia. -We'll treat the patient with broad-spectrum IV antibiotics with vancomycin, Zosyn. Follow blood, sputum cultures. Follow hemodynamics and follow clinically.  2. Pneumonia-source of patient's sepsis. Chest x-ray showing a left lower lobe pneumonia. -We'll treat with IV vancomycin, Zosyn. Follow blood, sputum cultures.  3. Acute respiratory failure with hypoxia-secondary to pneumonia with also underlying radiation pneumonitis. -Continue IV  antibiotics as mentioned above, will also had some IV steroids, scheduled DuoNeb's, Pulmicort nebs.  4. GERD-continue Protonix.  5. Hypothyroidism-Synthroid.  6. Depression-continue Celexa.  7. Anxiety-continue Xanax.    All the records are reviewed and case discussed with ED provider. Management plans discussed with the patient, family and they are in agreement.  CODE STATUS: Full  TOTAL TIME TAKING CARE OF THIS PATIENT: 45 minutes.    Henreitta Leber M.D on 11/26/2016 at 2:42 PM  Between 7am to 6pm - Pager - [phone]  After 6pm go to [email or web address] - password [email or web address] [facility]  [facility]  Office  [phone]  CC: Primary care physician; Madelyn Brunner, MD

## 2016-11-26 NOTE — ED Notes (Signed)
CODE SEPSIS CALLED TO [facility] AT [facility] 

## 2016-11-26 NOTE — ED Notes (Signed)
Pt stating that she is "feeling about the same." Pt is more alert than she was on initial arrival and husband stating that pt is "looking a little better." Pt's systolic BP is on the low end. Pt repositioned and different cuff used.

## 2016-11-27 ENCOUNTER — Ambulatory Visit: Payer: Medicare Other | Admitting: Pulmonary Disease

## 2016-11-27 LAB — BASIC METABOLIC PANEL
Anion gap: 7 (ref 5–15)
BUN: 26 mg/dL — AB (ref 6–20)
CALCIUM: 8.1 mg/dL — AB (ref 8.9–10.3)
CO2: 23 mmol/L (ref 22–32)
CREATININE: 0.92 mg/dL (ref 0.44–1.00)
Chloride: 111 mmol/L (ref 101–111)
GFR calc Af Amer: >60 mL/min (ref >60)
Glucose, Bld: 150 mg/dL — ABNORMAL HIGH (ref 65–99)
Potassium: 3.9 mmol/L (ref 3.5–5.1)
SODIUM: 141 mmol/L (ref 135–145)

## 2016-11-27 LAB — CBC
HCT: 34.2 % — ABNORMAL LOW (ref 35.0–47.0)
Hemoglobin: 11.4 g/dL — ABNORMAL LOW (ref 12.0–16.0)
MCH: 33.2 pg (ref 26.0–34.0)
MCHC: 33.3 g/dL (ref 32.0–36.0)
MCV: 99.7 fL (ref 80.0–100.0)
PLATELETS: 160 10*3/uL (ref 150–440)
RBC: 3.43 MIL/uL — ABNORMAL LOW (ref 3.80–5.20)
RDW: 16 % — ABNORMAL HIGH (ref 11.5–14.5)
WBC: 14.2 10*3/uL — AB (ref 3.6–11.0)

## 2016-11-27 LAB — STREP PNEUMONIAE URINARY ANTIGEN: Strep Pneumo Urinary Antigen: NEGATIVE

## 2016-11-27 MED ORDER — DEXTROSE 5 % IV SOLN
1.0000 g | INTRAVENOUS | Status: DC
  Administered 2016-11-27 – 2016-11-28 (×2): 1 g via INTRAVENOUS
  Filled 2016-11-27 (×2): qty 10

## 2016-11-27 MED ORDER — AZITHROMYCIN 500 MG PO TABS
500.0000 mg | ORAL_TABLET | Freq: Every day | ORAL | Status: DC
  Administered 2016-11-27 – 2016-11-29 (×3): 500 mg via ORAL
  Filled 2016-11-27 (×3): qty 1

## 2016-11-27 NOTE — Progress Notes (Signed)
[place] at [place] NAME: Anna Mcbride    MR#:  [id number]  DATE OF BIRTH:  07-28-1945  SUBJECTIVE:  CHIEF COMPLAINT:   Chief Complaint  Patient presents with  . Weakness   Still feels weak and SOB. Cough. Wheezing Afebrile now  REVIEW OF SYSTEMS:    Review of Systems  Constitutional: Positive for malaise/fatigue. Negative for chills and fever.  HENT: Negative for sore throat.   Eyes: Negative for blurred vision, double vision and pain.  Respiratory: Positive for cough, shortness of breath and wheezing. Negative for hemoptysis.   Cardiovascular: Negative for chest pain, palpitations, orthopnea and leg swelling.  Gastrointestinal: Negative for abdominal pain, constipation, diarrhea, heartburn, nausea and vomiting.  Genitourinary: Negative for dysuria and hematuria.  Musculoskeletal: Negative for back pain and joint pain.  Skin: Negative for rash.  Neurological: Positive for dizziness and weakness. Negative for sensory change, speech change, focal weakness and headaches.  Endo/Heme/Allergies: Does not bruise/bleed easily.  Psychiatric/Behavioral: Negative for depression. The patient is not nervous/anxious.    DRUG ALLERGIES:   Allergies  Allergen Reactions  . Hydrocodone-Homatropine Itching  . Tussionex Pennkinetic Er [Hydrocod Polst-Cpm Polst Er] Itching  . Ciprofloxacin Rash    VITALS:  Blood pressure (!) 99/57, pulse 95, temperature 97.5 F (36.4 C), temperature source Oral, resp. rate 18, height 5\' 1"  (1.549 m), weight 57.1 kg (125 lb 14.4 oz), SpO2 100 %.  PHYSICAL EXAMINATION:   Physical Exam  GENERAL:  71 y.o.-year-old patient lying in the bed with no acute distress.  EYES: Pupils equal, round, reactive to light and accommodation. No scleral icterus. Extraocular muscles intact.  HEENT: Head atraumatic, normocephalic. Oropharynx and nasopharynx clear.  NECK:  Supple, no jugular venous distention. No thyroid enlargement,  no tenderness.  LUNGS: Result of breathing with bilateral wheezing and coarse breath sounds. CARDIOVASCULAR: S1, S2 normal. No murmurs, rubs, or gallops.  ABDOMEN: Soft, nontender, nondistended. Bowel sounds present. No organomegaly or mass.  EXTREMITIES: No cyanosis, clubbing or edema b/l.    NEUROLOGIC: Cranial nerves II through XII are intact. No focal Motor or sensory deficits b/l.   PSYCHIATRIC: The patient is alert and oriented x 3.  SKIN: No obvious rash, lesion, or ulcer.   LABORATORY PANEL:   CBC  Recent Labs Lab 11/27/16 0407  WBC 14.2*  HGB 11.4*  HCT 34.2*  PLT 160   ------------------------------------------------------------------------------------------------------------------ Chemistries   Recent Labs Lab 11/26/16 1257 11/27/16 0407  NA 137 141  K 3.6 3.9  CL 102 111  CO2 28 23  GLUCOSE 96 150*  BUN 30* 26*  CREATININE 1.06* 0.92  CALCIUM 8.9 8.1*  AST 24  --   ALT 12*  --   ALKPHOS 51  --   BILITOT 1.0  --    ------------------------------------------------------------------------------------------------------------------  Cardiac Enzymes No results for input(s): TROPONINI in the last 168 hours. ------------------------------------------------------------------------------------------------------------------  RADIOLOGY:  Dg Chest 2 View  Result Date: 11/26/2016 CLINICAL DATA:  Cough. Weakness. Fell on left shoulder today. Remote history of lung cancer. Recent treatment with antibiotics. EXAM: CHEST  2 VIEW COMPARISON:  Two-view chest x-ray 11/15/2016. FINDINGS: The heart size is normal. The left subclavian Port-A-Cath is stable in position. Volume loss is again noted in the right lung following previous surgery and radiation. New medial left lower lobe pneumonia is present. There is mild pulmonary vascular congestion bilaterally. IMPRESSION: 1. New left lower lobe pneumonia. 2. Postoperative changes of the right lung. 3. Mild pulmonary vascular  congestion  without cardiomegaly. 4. Left subclavian Port-A-Cath is stable. Electronically Signed   By: San Morelle M.D.   On: 11/26/2016 13:31     ASSESSMENT AND PLAN:   71 year old female with past medical history of non-Hodgkin's lymphoma in remission, radiation pneumonitis, anemia of chronic disease, who presented to the hospital due to weakness, cough and shortness of breath.  * LLL community acquired pneumonia with sepsis and asthma exacerbation POA IV abx. Stop vancomycin and cefepime. Start levaquin Sputum cx and Strep pneumonia urine ag. Stop IVF Nebs IV steroids as patient is on chronic prednisone therapy and now has wheezing  * Acute respiratory failure with hypoxia-secondary to pneumonia with also underlying radiation pneumonitis. Nebs. Wean O2 as tolerated  * GERD-continue Protonix.  * Hypothyroidism-Synthroid.  * Depression and anxiety-continue Celexa. continue Xanax.  All the records are reviewed and case discussed with Care Management/Social Worker Management plans discussed with the patient, family and they are in agreement.  CODE STATUS: FULL CODE  DVT Prophylaxis: SCDs  TOTAL TIME TAKING CARE OF THIS PATIENT: 35 minutes.   POSSIBLE D/C IN 1-2 DAYS, DEPENDING ON CLINICAL CONDITION.  Hillary Bow R M.D on 11/27/2016 at 11:17 AM  Between 7am to 6pm - Pager - [phone]  After 6pm go to [email or web address] - password [email or web address] [facility]  Office  [phone]  CC: Primary care physician; Madelyn Brunner, MD  Note: This dictation was prepared with Dragon dictation along with smaller phrase technology. Any transcriptional errors that result from this process are unintentional.

## 2016-11-28 MED ORDER — CEFUROXIME AXETIL 500 MG PO TABS
500.0000 mg | ORAL_TABLET | Freq: Two times a day (BID) | ORAL | Status: DC
  Administered 2016-11-29: 09:00:00 500 mg via ORAL
  Filled 2016-11-28: qty 1

## 2016-11-28 MED ORDER — DEXTROMETHORPHAN POLISTIREX ER 30 MG/5ML PO SUER
30.0000 mg | Freq: Every day | ORAL | Status: DC | PRN
  Filled 2016-11-28: qty 5

## 2016-11-28 MED ORDER — BISACODYL 5 MG PO TBEC
5.0000 mg | DELAYED_RELEASE_TABLET | Freq: Every day | ORAL | Status: DC | PRN
  Administered 2016-11-28: 5 mg via ORAL
  Filled 2016-11-28: qty 1

## 2016-11-28 MED ORDER — MAGNESIUM CITRATE PO SOLN
1.0000 | Freq: Once | ORAL | Status: DC | PRN
  Filled 2016-11-28: qty 296

## 2016-11-28 MED ORDER — METHYLPREDNISOLONE SODIUM SUCC 40 MG IJ SOLR
40.0000 mg | Freq: Three times a day (TID) | INTRAMUSCULAR | Status: DC
  Administered 2016-11-28 – 2016-11-29 (×3): 40 mg via INTRAVENOUS
  Filled 2016-11-28 (×3): qty 1

## 2016-11-28 MED ORDER — GUAIFENESIN ER 600 MG PO TB12
600.0000 mg | ORAL_TABLET | Freq: Every day | ORAL | Status: DC | PRN
  Administered 2016-11-28: 600 mg via ORAL
  Filled 2016-11-28 (×2): qty 1

## 2016-11-28 NOTE — Progress Notes (Signed)
[facility] at [place] NAME: Anna Mcbride    MR#:  [id number]  DATE OF BIRTH:  10-Oct-1945  SUBJECTIVE:  CHIEF COMPLAINT:   Chief Complaint  Patient presents with  . Weakness   Still feels weak and SOB. Cough. Wheezing Afebrile now  REVIEW OF SYSTEMS:    Review of Systems  Constitutional: Positive for malaise/fatigue. Negative for chills and fever.  HENT: Negative for sore throat.   Eyes: Negative for blurred vision, double vision and pain.  Respiratory: Positive for cough, shortness of breath and wheezing. Negative for hemoptysis.   Cardiovascular: Negative for chest pain, palpitations, orthopnea and leg swelling.  Gastrointestinal: Negative for abdominal pain, constipation, diarrhea, heartburn, nausea and vomiting.  Genitourinary: Negative for dysuria and hematuria.  Musculoskeletal: Negative for back pain and joint pain.  Skin: Negative for rash.  Neurological: Positive for dizziness and weakness. Negative for sensory change, speech change, focal weakness and headaches.  Endo/Heme/Allergies: Does not bruise/bleed easily.  Psychiatric/Behavioral: Negative for depression. The patient is not nervous/anxious.    DRUG ALLERGIES:   Allergies  Allergen Reactions  . Hydrocodone-Homatropine Itching  . Tussionex Pennkinetic Er [Hydrocod Polst-Cpm Polst Er] Itching  . Ciprofloxacin Rash    VITALS:  Blood pressure 103/62, pulse (!) 102, temperature 97.8 F (36.6 C), temperature source Oral, resp. rate 16, height 5\' 1"  (1.549 m), weight 57.1 kg (125 lb 14.4 oz), SpO2 98 %.  PHYSICAL EXAMINATION:   Physical Exam  GENERAL:  71 y.o.-year-old patient lying in the bed with no acute distress.  EYES: Pupils equal, round, reactive to light and accommodation. No scleral icterus. Extraocular muscles intact.  HEENT: Head atraumatic, normocephalic. Oropharynx and nasopharynx clear.  NECK:  Supple, no jugular venous distention. No thyroid enlargement,  no tenderness.  LUNGS:  breathing with bilateral wheezing and coarse breath sounds. CARDIOVASCULAR: S1, S2 normal. No murmurs, rubs, or gallops.  ABDOMEN: Soft, nontender, nondistended. Bowel sounds present. No organomegaly or mass.  EXTREMITIES: No cyanosis, clubbing or edema b/l.    NEUROLOGIC: Cranial nerves II through XII are intact. No focal Motor or sensory deficits b/l.   PSYCHIATRIC: The patient is alert and oriented x 3.  SKIN: chronic generalized rash  LABORATORY PANEL:   CBC  Recent Labs Lab 11/27/16 0407  WBC 14.2*  HGB 11.4*  HCT 34.2*  PLT 160   ------------------------------------------------------------------------------------------------------------------ Chemistries   Recent Labs Lab 11/26/16 1257 11/27/16 0407  NA 137 141  K 3.6 3.9  CL 102 111  CO2 28 23  GLUCOSE 96 150*  BUN 30* 26*  CREATININE 1.06* 0.92  CALCIUM 8.9 8.1*  AST 24  --   ALT 12*  --   ALKPHOS 51  --   BILITOT 1.0  --    ------------------------------------------------------------------------------------------------------------------  Cardiac Enzymes No results for input(s): TROPONINI in the last 168 hours. ------------------------------------------------------------------------------------------------------------------  RADIOLOGY:  Dg Chest 2 View  Result Date: 11/26/2016 CLINICAL DATA:  Cough. Weakness. Fell on left shoulder today. Remote history of lung cancer. Recent treatment with antibiotics. EXAM: CHEST  2 VIEW COMPARISON:  Two-view chest x-ray 11/15/2016. FINDINGS: The heart size is normal. The left subclavian Port-A-Cath is stable in position. Volume loss is again noted in the right lung following previous surgery and radiation. New medial left lower lobe pneumonia is present. There is mild pulmonary vascular congestion bilaterally. IMPRESSION: 1. New left lower lobe pneumonia. 2. Postoperative changes of the right lung. 3. Mild pulmonary vascular congestion without  cardiomegaly. 4. Left subclavian  Port-A-Cath is stable. Electronically Signed   By: San Morelle M.D.   On: 11/26/2016 13:31     ASSESSMENT AND PLAN:   71 year old female with past medical history of non-Hodgkin's lymphoma in remission, radiation pneumonitis, anemia of chronic disease, who presented to the hospital due to weakness, cough and shortness of breath.  * LLL community acquired pneumonia with sepsis and asthma exacerbation POA IV abx rocephin and zithromax Strep pneumonia urine ag negative Stop IVF Nebs IV steroids as patient is on chronic prednisone therapy and now has wheezing  * Acute respiratory failure with hypoxia-secondary to pneumonia with also underlying radiation pneumonitis. Nebs. Wean O2 as tolerated  * GERD-continue Protonix.  * Hypothyroidism-Synthroid.  * Depression and anxiety-continue Celexa. continue Xanax.  PT to see due to weakness and unsteady gait.  All the records are reviewed and case discussed with Care Management/Social Worker Management plans discussed with the patient, family and they are in agreement.  CODE STATUS: FULL CODE  DVT Prophylaxis: SCDs  TOTAL TIME TAKING CARE OF THIS PATIENT: 25 minutes.   POSSIBLE D/C IN 1-2 DAYS, DEPENDING ON CLINICAL CONDITION.  Siera Beyersdorf M.D on 11/28/2016 at 12:05 PM  Between 7am to 6pm - Pager - [phone]  After 6pm go to [email or web address] - password [email or web address] [facility]  Office  [phone]  CC: Primary care physician; Madelyn Brunner, MD  Note: This dictation was prepared with Dragon dictation along with smaller phrase technology. Any transcriptional errors that result from this process are unintentional.

## 2016-11-28 NOTE — Evaluation (Signed)
Physical Therapy Evaluation Patient Details Name: Anna Mcbride MRN: [id number] DOB: [birth year] Today's Date: 11/28/2016   History of Present Illness  Pt is a 71 y.o. female admitted for sepsis after presenting to the ED with weakness followed by a fall onto her L shoulder and tachycardia. Chest x-ray showed new L lower lobe pneumonia. Pt has PMH of non-Hodgkin's lymphoma in remission, anemia, acute respatory failure, radiation pneumonitis, pnumothorax, HLD, hypothyroidism, L proximal humerus fx (2016), s/p bone marrow transplant, and implanted L subclavian Port-A-Cath.  Clinical Impression  Pt was in bed upon entry and agreeable to PT treatment. Pt lives with her husband in a one story home with one step to enter. Pt is home alone during the day, but her husband calls multiple times throughout the day and her neighbor is available to come over to check on her and has a key. Pt presents with generalized weakness and decreased balance. Pt was modified independent with bed mobility, supervision for transfers, and CGA for ambulation 80 ft with RW and 120 ft w/o RW on 2L O2. Pt will benefit from skilled PT during admission and HHPT upon d/c to increase strength and balance to return to PLOF.      Follow Up Recommendations Home health PT    Equipment Recommendations  Cane (Pt has cane at home)    Recommendations for Other Services       Precautions / Restrictions Precautions Precautions: Fall Restrictions Weight Bearing Restrictions: No      Mobility  Bed Mobility Overal bed mobility: Modified Independent                Transfers Overall transfer level: Needs assistance Equipment used: None Transfers: Sit to/from Stand;Stand Pivot Transfers Sit to Stand: Supervision Stand pivot transfers: Supervision          Ambulation/Gait Ambulation/Gait assistance: Min guard Ambulation Distance (Feet): 200 Feet Assistive device: Rolling walker (2 wheeled);None Gait  Pattern/deviations: Decreased step length - right;Decreased step length - left;Narrow base of support   Gait velocity interpretation: <1.8 ft/sec, indicative of risk for recurrent falls General Gait Details: Pt ambulated 80 ft with RW, then 120 ft w/o RW, pt appears slightly less steady w/o RW   Stairs            Wheelchair Mobility    Modified Rankin (Stroke Patients Only)       Balance Overall balance assessment: Needs assistance Sitting-balance support: No upper extremity supported;Feet unsupported Sitting balance-Leahy Scale: Normal     Standing balance support: No upper extremity supported;During functional activity Standing balance-Leahy Scale: Good                               Pertinent Vitals/Pain Pain Assessment: No/denies pain  Vitals: HR remained between 111-121 bpm and O2 remained >97% on 2L (RN notified)    Home Living Family/patient expects to be discharged to:: Private residence Living Arrangements: Spouse/significant other Available Help at Discharge: Family;Neighbor Type of Home: House Home Access: Stairs to enter Entrance Stairs-Rails: None Entrance Stairs-Number of Steps: 1 Home Layout: One level Home Equipment: Cane - single point;Shower seat - built in;Grab bars - tub/shower      Prior Function Level of Independence: Independent         Comments: Pt is Independent with ADLs/IADLs, drives, and has a history of 2-3 falls in the past 6 months. Pt reports falls occur with LOC and do not occur with changes in  position, pt suspects caused by medications     Hand Dominance   Dominant Hand: Right    Extremity/Trunk Assessment   Upper Extremity Assessment Upper Extremity Assessment: Generalized weakness (grossly at least 3/5 B UE strength; fair grip strength )    Lower Extremity Assessment Lower Extremity Assessment: Generalized weakness (B LE 4-/5 strength hip flexion and knee flexion/extension, 5/5 ankle DF/PF)     Cervical / Trunk Assessment Cervical / Trunk Assessment: Normal  Communication   Communication: No difficulties  Cognition Arousal/Alertness: Awake/alert Behavior During Therapy: WFL for tasks assessed/performed Overall Cognitive Status: Within Functional Limits for tasks assessed                                        General Comments      Exercises     Assessment/Plan    PT Assessment Patient needs continued PT services  PT Problem List Decreased strength;Decreased mobility;Decreased activity tolerance;Decreased balance;Decreased knowledge of use of DME       PT Treatment Interventions DME instruction;Therapeutic activities;Gait training;Therapeutic exercise;Patient/family education;Stair training;Balance training;Functional mobility training    PT Goals (Current goals can be found in the Care Plan section)  Acute Rehab PT Goals Patient Stated Goal: to go home PT Goal Formulation: With patient Time For Goal Achievement: 12/12/16 Potential to Achieve Goals: Good    Frequency Min 2X/week   Barriers to discharge        Co-evaluation               AM-PAC PT "6 Clicks" Daily Activity  Outcome Measure Difficulty turning over in bed (including adjusting bedclothes, sheets and blankets)?: A Little Difficulty moving from lying on back to sitting on the side of the bed? : A Little Difficulty sitting down on and standing up from a chair with arms (e.g., wheelchair, bedside commode, etc,.)?: A Little Help needed moving to and from a bed to chair (including a wheelchair)?: A Little Help needed walking in hospital room?: A Little Help needed climbing 3-5 steps with a railing? : A Little 6 Click Score: 18    End of Session Equipment Utilized During Treatment: Gait belt;Oxygen (2L O2) Activity Tolerance: Patient tolerated treatment well Patient left: in chair;with call bell/phone within reach;with chair alarm set Nurse Communication: Mobility  status;Precautions PT Visit Diagnosis: Unsteadiness on feet (R26.81);History of falling (Z91.81);Muscle weakness (generalized) (M62.81)    Time: 8786-7672 PT Time Calculation (min) (ACUTE ONLY): 29 min   Charges:         PT G Codes:        Derald Lorge, SPT 11/28/2016, 4:16 PM

## 2016-11-29 MED ORDER — PREDNISONE 50 MG PO TABS: 50.0000 mg | ORAL_TABLET | Freq: Every day | ORAL | Status: DC

## 2016-11-29 MED ORDER — AZITHROMYCIN 500 MG PO TABS: 500.0000 mg | ORAL_TABLET | Freq: Every day | ORAL | 0 refills | Status: DC

## 2016-11-29 MED ORDER — PREDNISONE 20 MG PO TABS: ORAL_TABLET | ORAL | 0 refills | Status: DC

## 2016-11-29 MED ORDER — PREDNISONE 10 MG PO TABS: ORAL_TABLET | ORAL | 0 refills | Status: DC

## 2016-11-29 MED ORDER — CEFUROXIME AXETIL 500 MG PO TABS: 500.0000 mg | ORAL_TABLET | Freq: Two times a day (BID) | ORAL | 0 refills | Status: DC

## 2016-11-29 NOTE — Discharge Summary (Signed)
[facility] [place] NAME: Anna Mcbride    MR#:  [id number]  DATE OF BIRTH:  [birth year]  DATE OF ADMISSION:  11/26/2016 ADMITTING PHYSICIAN: Henreitta Leber, MD  DATE OF DISCHARGE: 11/29/2016  PRIMARY CARE PHYSICIAN: Madelyn Brunner, MD    ADMISSION DIAGNOSIS:  Healthcare-associated pneumonia [J18.9] Sepsis, due to unspecified organism [facility]) [A41.9]  DISCHARGE DIAGNOSIS:    SECONDARY DIAGNOSIS:   Past Medical History:  Diagnosis Date  . Acute respiratory failure ([place]) 11/29/2014  . 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 Asthma   . Non Hodgkin's lymphoma ([place]) [id number]  . Personal history of chemotherapy     HOSPITAL COURSE:   71 year old female with past medical history of non-Hodgkin's lymphoma in remission, radiation pneumonitis, anemia of chronic disease, who presented to the hospital due to weakness, cough and shortness of breath.  * LLL community acquired pneumonia with sepsis and asthma exacerbation POA IV abx rocephin and zithromax---change to oral abxs Strep pneumonia urine ag negative Stop IVF Nebs Received IV steroids--we'll change to oral taper and patient will then resume her chronic prednisone therapy and -patient feels a lot better today.  * Acute respiratory failure with hypoxia-secondary to pneumonia with also underlying radiation pneumonitis. Nebs. Wean O2 as tolerated  * GERD-continue Protonix.  * Hypothyroidism-Synthroid.  * Depression and anxiety-continue Celexa. continue Xanax.  PT recommended home health PT  Discharge home. Discussed with patient and husband agreeable. CONSULTS OBTAINED:  Treatment Team:  Fritzi Mandes, MD  DRUG ALLERGIES:   Allergies  Allergen Reactions  . Hydrocodone-Homatropine Itching  . Tussionex Pennkinetic Er [Hydrocod Polst-Cpm Polst Er] Itching  . Ciprofloxacin Rash    DISCHARGE MEDICATIONS:   Current Discharge Medication List    START taking  these medications   Details  azithromycin (ZITHROMAX) 500 MG tablet Take 1 tablet (500 mg total) by mouth daily. Qty: 4 tablet, Refills: 0    cefUROXime (CEFTIN) 500 MG tablet Take 1 tablet (500 mg total) by mouth 2 (two) times daily with a meal. Qty: 8 tablet, Refills: 0      CONTINUE these medications which have CHANGED   Details  !! predniSONE (DELTASONE) 10 MG tablet Take 50 mg daily taper by 10 mg daily and then resume your 20 mg daily prednisone as before Qty: 15 tablet, Refills: 0    !! predniSONE (DELTASONE) 20 MG tablet Resume your 20 mg daily dose after finishing the tapering dose Qty: 30 tablet, Refills: 0     !! - Potential duplicate medications found. Please discuss with provider.    CONTINUE these medications which have NOT CHANGED   Details  albuterol (PROVENTIL HFA;VENTOLIN HFA) 108 (90 BASE) MCG/ACT inhaler Inhale 1 puff into the lungs every 6 (six) hours as needed for wheezing or shortness of breath.    albuterol (PROVENTIL) (2.5 MG/3ML) 0.083% nebulizer solution Take 3 mLs (2.5 mg total) by nebulization every 4 (four) hours as needed for wheezing or shortness of breath. Qty: 75 mL, Refills: 12    ALPRAZolam (XANAX) 0.5 MG tablet Take 1 tablet (0.5 mg total) by mouth at bedtime as needed for sleep. Qty: 20 tablet, Refills: 1    budesonide (PULMICORT) 0.5 MG/2ML nebulizer solution Take 2 mLs (0.5 mg total) by nebulization 2 (two) times daily. Qty: 120 mL, Refills: 11    citalopram (CELEXA) 20 MG tablet TAKE 1 TABLET BY MOUTH ONCE DAILY Qty: 90 tablet, Refills: 3    dextromethorphan-guaiFENesin (MUCINEX DM) 30-600  MG 12hr tablet Take 1 tablet by mouth daily.    formoterol (PERFOROMIST) 20 MCG/2ML nebulizer solution Take 2 mLs (20 mcg total) by nebulization 2 (two) times daily. Qty: 120 mL, Refills: 11    guaiFENesin-codeine 100-10 MG/5ML syrup Take 5 mLs by mouth every 4 (four) hours as needed for cough. Qty: 240 mL, Refills: 0    levothyroxine (SYNTHROID,  LEVOTHROID) 25 MCG tablet TAKE 1 TABLET BY MOUTH ONCE DAILY ON AN EMPTY STOMACH. WAIT 30 MINUTES BEFORE TAKING OTHER MEDS. Qty: 30 tablet, Refills: 2    nystatin (MYCOSTATIN) 100000 UNIT/ML suspension Take 5 mLs (500,000 Units total) by mouth 4 (four) times daily. Qty: 120 mL, Refills: 0    omeprazole (PRILOSEC) 20 MG capsule Take 1 capsule (20 mg total) by mouth 2 (two) times daily before a meal. Qty: 60 capsule, Refills: 3   Associated Diagnoses: Non-Hodgkin's lymphoma, unspecified body region, unspecified non-Hodgkin lymphoma type (HCC)    ondansetron (ZOFRAN) 4 MG tablet Take 1 tablet (4 mg total) by mouth every 6 (six) hours as needed for nausea or vomiting. Qty: 30 tablet, Refills: 2    potassium chloride SA (K-DUR,KLOR-CON) 20 MEQ tablet Take 1 tablet (20 mEq total) by mouth 2 (two) times daily. Qty: 30 tablet, Refills: 0    vitamin C (ASCORBIC ACID) 500 MG tablet Take 500 mg by mouth daily.    AMBULATORY NON FORMULARY MEDICATION Medication Name: Incentive Spirometer Use 10-15 times daily Qty: 1 each, Refills: 0    aspirin EC 81 MG tablet Take 81 mg by mouth daily.    Respiratory Therapy Supplies (FLUTTER) DEVI Use 10-15 times daily Qty: 1 each, Refills: 0        If you experience worsening of your admission symptoms, develop shortness of breath, life threatening emergency, suicidal or homicidal thoughts you must seek medical attention immediately by calling 911 or calling your MD immediately  if symptoms less severe.  You Must read complete instructions/literature along with all the possible adverse reactions/side effects for all the Medicines you take and that have been prescribed to you. Take any new Medicines after you have completely understood and accept all the possible adverse reactions/side effects.   Please note  You were cared for by a hospitalist during your hospital stay. If you have any questions about your discharge medications or the care you received while  you were in the hospital after you are discharged, you can call the unit and asked to speak with the hospitalist on call if the hospitalist that took care of you is not available. Once you are discharged, your primary care physician will handle any further medical issues. Please note that NO REFILLS for any discharge medications will be authorized once you are discharged, as it is imperative that you return to your primary care physician (or establish a relationship with a primary care physician if you do not have one) for your aftercare needs so that they can reassess your need for medications and monitor your lab values. Today   SUBJECTIVE   Feels better  VITAL SIGNS:  Blood pressure 116/72, pulse (!) 110, temperature 97.6 F (36.4 C), temperature source Oral, resp. rate 19, height 5\' 1"  (1.549 m), weight 57.1 kg (125 lb 14.4 oz), SpO2 99 %.  I/O:   Intake/Output Summary (Last 24 hours) at 11/29/16 1130 Last data filed at 11/29/16 0952  Gross per 24 hour  Intake              600 ml  Output              200 ml  Net              400 ml    PHYSICAL EXAMINATION:  GENERAL:  71 y.o.-year-old patient lying in the bed with no acute distress.  EYES: Pupils equal, round, reactive to light and accommodation. No scleral icterus. Extraocular muscles intact.  HEENT: Head atraumatic, normocephalic. Oropharynx and nasopharynx clear.  NECK:  Supple, no jugular venous distention. No thyroid enlargement, no tenderness.  LUNGS: Distant breath sounds bilaterally, no wheezing, chronic fine rales, no rhonchi or crepitation. No use of accessory muscles of respiration.  CARDIOVASCULAR: S1, S2 normal. No murmurs, rubs, or gallops.  ABDOMEN: Soft, non-tender, non-distended. Bowel sounds present. No organomegaly or mass.  EXTREMITIES: No pedal edema, cyanosis, or clubbing.  NEUROLOGIC: Cranial nerves II through XII are intact. Muscle strength 5/5 in all extremities. Sensation intact. Gait not checked.   PSYCHIATRIC: The patient is alert and oriented x 3.  SKIN: No obvious rash, lesion, or ulcer.   DATA REVIEW:   CBC   Recent Labs Lab 11/27/16 0407  WBC 14.2*  HGB 11.4*  HCT 34.2*  PLT 160    Chemistries   Recent Labs Lab 11/26/16 1257 11/27/16 0407  NA 137 141  K 3.6 3.9  CL 102 111  CO2 28 23  GLUCOSE 96 150*  BUN 30* 26*  CREATININE 1.06* 0.92  CALCIUM 8.9 8.1*  AST 24  --   ALT 12*  --   ALKPHOS 51  --   BILITOT 1.0  --     Microbiology Results   Recent Results (from the past 240 hour(s))  Culture, blood (Routine x 2)     Status: None (Preliminary result)   Collection Time: 11/26/16 12:58 PM  Result Value Ref Range Status   Specimen Description BLOOD LEFT ASSIST CONTROL  Final   Special Requests   Final    BOTTLES DRAWN AEROBIC AND ANAEROBIC Blood Culture results may not be optimal due to an excessive volume of blood received in culture bottles   Culture NO GROWTH 3 DAYS  Final   Report Status PENDING  Incomplete  Culture, blood (Routine x 2)     Status: None (Preliminary result)   Collection Time: 11/26/16 12:58 PM  Result Value Ref Range Status   Specimen Description BLOOD RIGHT ARM  Final   Special Requests   Final    BOTTLES DRAWN AEROBIC AND ANAEROBIC Blood Culture adequate volume   Culture NO GROWTH 3 DAYS  Final   Report Status PENDING  Incomplete  MRSA PCR Screening     Status: None   Collection Time: 11/26/16  6:56 PM  Result Value Ref Range Status   MRSA by PCR NEGATIVE NEGATIVE Final    Comment:        The GeneXpert MRSA Assay (FDA approved for NASAL specimens only), is one component of a comprehensive MRSA colonization surveillance program. It is not intended to diagnose MRSA infection nor to guide or monitor treatment for MRSA infections.     RADIOLOGY:  No results found.   Management plans discussed with the patient, family and they are in agreement.  CODE STATUS:     Code Status Orders        Start     Ordered    11/26/16 1843  Full code  Continuous     11/26/16 1844    Code Status History    Date Active  Date Inactive Code Status Order ID Comments User Context   09/12/2016  2:23 AM 09/13/2016  5:56 PM DNR [id number]  Harrie Foreman, MD ED   06/18/2016  6:21 PM 06/20/2016  3:35 PM DNR [id number]  Vaughan Basta, MD Inpatient   11/29/2014  2:05 PM 12/05/2014  3:50 PM DNR [id number]  Evlyn Kanner, NP Inpatient    Advance Directive Documentation     Most Recent Value  Type of Advance Directive  Healthcare Power of Attorney  Pre-existing out of facility DNR order (yellow form or pink MOST form)  -  "MOST" Form in Place?  -      TOTAL TIME TAKING CARE OF THIS PATIENT: 40 minutes.    Gene Colee M.D on 11/29/2016 at 11:30 AM  Between 7am to 6pm - Pager - [phone] After 6pm go to [email or web address] - password [email or web address] [facility]  Office  [phone]  CC: Primary care physician; Madelyn Brunner, MD

## 2016-11-29 NOTE — Plan of Care (Signed)
Problem: Education: Goal: Knowledge of [facility] Education information/materials will improve Outcome: Progressing Free of falls during shift.  Reported feeling constipated, Dr. Ara Kussmaul paged, received ordered PRN PO Bisacodyl 5mg .  No other needs overnight.  Bed in low position, BSC at bedside.  Call bell within reach, [place].

## 2016-11-29 NOTE — Progress Notes (Signed)
Discharge instructions given and went over with patient and patients husband at bedside. Prescriptions reviewed. All questions answered. Patient discharged home with husband via wheelchair by nursing staff Madlyn Frankel, RN

## 2016-11-29 NOTE — Care Management Important Message (Signed)
Important Message  Patient Details  Name: Anna Mcbride MRN: [id number] Date of Birth: 1946/03/17   Medicare Important Message Given:  Yes    Shelbie Ammons, RN 11/29/2016, 8:00 AM

## 2016-11-29 NOTE — Care Management Note (Signed)
Case Management Note  Patient Details  Name: Anna Mcbride MRN: [id number] Date of Birth: [birth year]  Subjective/Objective:      Admitted to [facility] with the diagnosis of sepsis. Lives with husband, [name], [phone]). Next appointment with Dr. Mortimer Fries is in June. Sees Drs Lisette Grinder and Rosaland Lao. Home Health per [facility] x 2 in the past, No skilled Nursing. Home oxygen per [facility] for many years. Wears 2 liters per nasal cannula continuous. Cane and tall toilet seat in the home. Prescriptions are filled at [facility]. Fell prior to admission. Appetite is O. K,  Takes care of all basic and instrumental activities of daily living herself, drives. Husband will transport.               Action/Plan:  Physical therapy evaluation completed. Recommends home with Home Health/physical therapy. [name] wants [facility]. [facility] can accept for physical therapy.   Expected Discharge Date:  11/28/16               Expected Discharge Plan:     In-House Referral:     Discharge planning Services   yes  Post Acute Care Choice:    Choice offered to:     DME Arranged:    DME Agency:     HH Arranged:   yes HH Agency:   [facility]  Status of Service:     If discussed at [place] of Stay Meetings, dates discussed:    Additional Comments:  Shelbie Ammons, RN MSN CCM Care Management [phone] 11/29/2016, 8:16 AM

## 2016-12-01 LAB — CULTURE, BLOOD (ROUTINE X 2)
CULTURE: NO GROWTH
Culture: NO GROWTH
SPECIAL REQUESTS: ADEQUATE

## 2016-12-04 ENCOUNTER — Other Ambulatory Visit: Payer: Self-pay | Admitting: Internal Medicine

## 2016-12-04 DIAGNOSIS — E039 Hypothyroidism, unspecified: Secondary | ICD-10-CM

## 2016-12-06 ENCOUNTER — Encounter: Payer: Self-pay | Admitting: *Deleted

## 2016-12-09 DIAGNOSIS — J449 Chronic obstructive pulmonary disease, unspecified: Principal | ICD-10-CM

## 2016-12-10 ENCOUNTER — Other Ambulatory Visit: Payer: Self-pay | Admitting: Internal Medicine

## 2016-12-10 MED ORDER — PROAIR HFA 108 (90 BASE) MCG/ACT IN AERS
1 refills | Status: CP
Start: 2016-12-10 — End: 2017-02-05

## 2017-01-02 ENCOUNTER — Inpatient Hospital Stay: Payer: Medicare Other | Attending: Internal Medicine

## 2017-01-02 DIAGNOSIS — Z8572 Personal history of non-Hodgkin lymphomas: Secondary | ICD-10-CM | POA: Diagnosis not present

## 2017-01-02 DIAGNOSIS — Z452 Encounter for adjustment and management of vascular access device: Secondary | ICD-10-CM | POA: Diagnosis not present

## 2017-01-02 DIAGNOSIS — Z95828 Presence of other vascular implants and grafts: Secondary | ICD-10-CM

## 2017-01-02 MED ORDER — SODIUM CHLORIDE 0.9% FLUSH
10.0000 mL | INTRAVENOUS | Status: DC | PRN
  Administered 2017-01-02: 10 mL via INTRAVENOUS
  Filled 2017-01-02: qty 10

## 2017-01-02 MED ORDER — HEPARIN SOD (PORK) LOCK FLUSH 100 UNIT/ML IV SOLN
500.0000 [IU] | Freq: Once | INTRAVENOUS | Status: AC
  Administered 2017-01-02: 500 [IU] via INTRAVENOUS

## 2017-01-06 ENCOUNTER — Other Ambulatory Visit: Payer: Self-pay | Admitting: Internal Medicine

## 2017-01-06 ENCOUNTER — Encounter: Payer: Self-pay | Admitting: Internal Medicine

## 2017-01-06 ENCOUNTER — Ambulatory Visit (INDEPENDENT_AMBULATORY_CARE_PROVIDER_SITE_OTHER): Payer: Medicare Other | Admitting: Internal Medicine

## 2017-01-06 VITALS — BP 138/78 | HR 101 | Ht 61.0 in | Wt 126.0 lb

## 2017-01-06 DIAGNOSIS — J9611 Chronic respiratory failure with hypoxia: Secondary | ICD-10-CM

## 2017-01-06 MED ORDER — ALBUTEROL SULFATE (2.5 MG/3ML) 0.083% IN NEBU: 2.5000 mg | INHALATION_SOLUTION | RESPIRATORY_TRACT | 12 refills | Status: DC | PRN

## 2017-01-06 MED ORDER — FORMOTEROL FUMARATE 20 MCG/2ML IN NEBU: 20.0000 ug | INHALATION_SOLUTION | Freq: Two times a day (BID) | RESPIRATORY_TRACT | 11 refills | Status: DC

## 2017-01-06 MED ORDER — PREDNISONE 5 MG PO TABS: 5.0000 mg | ORAL_TABLET | Freq: Every day | ORAL | 1 refills | Status: DC

## 2017-01-06 MED ORDER — BUDESONIDE 0.5 MG/2ML IN SUSP: 0.5000 mg | Freq: Two times a day (BID) | RESPIRATORY_TRACT | 11 refills | Status: DC

## 2017-01-06 NOTE — Telephone Encounter (Signed)
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  last treatment in 2015; PET May 2016- NED. Clinically no evidence of recurrence  # Transient/mild pancytopenia- resolved;  Question viral.  # Chronic respiratory failure on oxygen; multiple lung infections; ? Bronchitis vs pneumonia. Currently improved.  # Hypokalemia-  Potassium 3.0;  Prescription for potassium  Given.  # Follow-up with me as planned in appx 3 months/ labs port flush/ MD.  Port for 6 weeks.
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
## 2017-01-06 NOTE — Patient Instructions (Signed)
Continue oxygen as prescribed 

## 2017-02-02 ENCOUNTER — Other Ambulatory Visit: Payer: Self-pay | Admitting: Internal Medicine

## 2017-02-02 DIAGNOSIS — C8590 Non-Hodgkin lymphoma, unspecified, unspecified site: Secondary | ICD-10-CM

## 2017-02-04 ENCOUNTER — Other Ambulatory Visit: Payer: Self-pay | Admitting: Gastroenterology

## 2017-02-04 DIAGNOSIS — Z1211 Encounter for screening for malignant neoplasm of colon: Secondary | ICD-10-CM

## 2017-02-04 DIAGNOSIS — L03119 Cellulitis of unspecified part of limb: Secondary | ICD-10-CM

## 2017-02-04 DIAGNOSIS — K210 Gastro-esophageal reflux disease with esophagitis: Secondary | ICD-10-CM

## 2017-02-04 DIAGNOSIS — J449 Chronic obstructive pulmonary disease, unspecified: Principal | ICD-10-CM

## 2017-02-05 MED ORDER — DOXYCYCLINE MONOHYDRATE 100 MG PO TABS
0 refills | Status: CP
Start: 2017-02-05 — End: 2017-02-21

## 2017-02-05 MED ORDER — PROAIR HFA 108 (90 BASE) MCG/ACT IN AERS
1 refills | Status: CP
Start: 2017-02-05 — End: 2017-03-21

## 2017-02-05 MED ORDER — ESOMEPRAZOLE MAGNESIUM 40 MG PO CPDR
1 refills | Status: CP
Start: 2017-02-05 — End: 2017-04-28

## 2017-02-18 ENCOUNTER — Ambulatory Visit
Admission: RE | Admit: 2017-02-18 | Discharge: 2017-02-18 | Disposition: A | Discharge status: Home / Self Care | Payer: Medicare Other | Source: Ambulatory Visit | Attending: Gastroenterology | Admitting: Gastroenterology

## 2017-02-18 DIAGNOSIS — Z1211 Encounter for screening for malignant neoplasm of colon: Secondary | ICD-10-CM

## 2017-02-19 DIAGNOSIS — R42 Dizziness and giddiness: Principal | ICD-10-CM

## 2017-02-19 MED ORDER — MECLIZINE HCL 25 MG PO TABS
2 refills | Status: CP
Start: 2017-02-19 — End: 2017-08-29

## 2017-02-21 ENCOUNTER — Inpatient Hospital Stay (HOSPITAL_BASED_OUTPATIENT_CLINIC_OR_DEPARTMENT_OTHER): Payer: Medicare Other | Admitting: Internal Medicine

## 2017-02-21 ENCOUNTER — Inpatient Hospital Stay: Payer: Medicare Other | Attending: Internal Medicine

## 2017-02-21 ENCOUNTER — Ambulatory Visit: Attending: Internal Medicine | Primary: Internal Medicine

## 2017-02-21 VITALS — BP 121/76 | HR 83 | Temp 97.6°F | Wt 123.4 lb

## 2017-02-21 DIAGNOSIS — J45909 Unspecified asthma, uncomplicated: Secondary | ICD-10-CM

## 2017-02-21 DIAGNOSIS — Z9484 Stem cells transplant status: Secondary | ICD-10-CM

## 2017-02-21 DIAGNOSIS — C8331 Diffuse large B-cell lymphoma, lymph nodes of head, face, and neck: Secondary | ICD-10-CM

## 2017-02-21 DIAGNOSIS — Z7982 Long term (current) use of aspirin: Secondary | ICD-10-CM | POA: Insufficient documentation

## 2017-02-21 DIAGNOSIS — Z923 Personal history of irradiation: Secondary | ICD-10-CM | POA: Insufficient documentation

## 2017-02-21 DIAGNOSIS — E876 Hypokalemia: Secondary | ICD-10-CM | POA: Diagnosis not present

## 2017-02-21 DIAGNOSIS — D61818 Other pancytopenia: Secondary | ICD-10-CM

## 2017-02-21 DIAGNOSIS — Z8701 Personal history of pneumonia (recurrent): Secondary | ICD-10-CM | POA: Diagnosis not present

## 2017-02-21 DIAGNOSIS — Z95828 Presence of other vascular implants and grafts: Secondary | ICD-10-CM

## 2017-02-21 DIAGNOSIS — J9610 Chronic respiratory failure, unspecified whether with hypoxia or hypercapnia: Secondary | ICD-10-CM | POA: Insufficient documentation

## 2017-02-21 DIAGNOSIS — Z79899 Other long term (current) drug therapy: Secondary | ICD-10-CM | POA: Insufficient documentation

## 2017-02-21 DIAGNOSIS — K219 Gastro-esophageal reflux disease without esophagitis: Secondary | ICD-10-CM

## 2017-02-21 DIAGNOSIS — M150 Primary generalized (osteo)arthritis: Secondary | ICD-10-CM

## 2017-02-21 DIAGNOSIS — E119 Type 2 diabetes mellitus without complications: Principal | ICD-10-CM

## 2017-02-21 DIAGNOSIS — E785 Hyperlipidemia, unspecified: Secondary | ICD-10-CM

## 2017-02-21 DIAGNOSIS — F329 Major depressive disorder, single episode, unspecified: Secondary | ICD-10-CM

## 2017-02-21 DIAGNOSIS — Z794 Long term (current) use of insulin: Secondary | ICD-10-CM

## 2017-02-21 DIAGNOSIS — I10 Essential (primary) hypertension: Secondary | ICD-10-CM

## 2017-02-21 DIAGNOSIS — Z6841 Body Mass Index (BMI) 40.0 and over, adult: Secondary | ICD-10-CM

## 2017-02-21 DIAGNOSIS — E1142 Type 2 diabetes mellitus with diabetic polyneuropathy: Secondary | ICD-10-CM

## 2017-02-21 DIAGNOSIS — F419 Anxiety disorder, unspecified: Secondary | ICD-10-CM

## 2017-02-21 DIAGNOSIS — M1990 Unspecified osteoarthritis, unspecified site: Secondary | ICD-10-CM

## 2017-02-21 DIAGNOSIS — Z7189 Other specified counseling: Principal | ICD-10-CM

## 2017-02-21 DIAGNOSIS — E1165 Type 2 diabetes mellitus with hyperglycemia: Secondary | ICD-10-CM

## 2017-02-21 LAB — COMPREHENSIVE METABOLIC PANEL
ALK PHOS: 53 U/L (ref 38–126)
ALT: 13 U/L — ABNORMAL LOW (ref 14–54)
ANION GAP: 8 (ref 5–15)
AST: 18 U/L (ref 15–41)
Albumin: 4 g/dL (ref 3.5–5.0)
BUN: 30 mg/dL — ABNORMAL HIGH (ref 6–20)
CALCIUM: 9.2 mg/dL (ref 8.9–10.3)
CHLORIDE: 99 mmol/L — AB (ref 101–111)
CO2: 36 mmol/L — AB (ref 22–32)
Creatinine, Ser: 1.22 mg/dL — ABNORMAL HIGH (ref 0.44–1.00)
GFR calc non Af Amer: 43 mL/min — ABNORMAL LOW (ref >60)
GFR, EST AFRICAN AMERICAN: 50 mL/min — AB (ref >60)
Glucose, Bld: 94 mg/dL (ref 65–99)
POTASSIUM: 2.8 mmol/L — AB (ref 3.5–5.1)
SODIUM: 143 mmol/L (ref 135–145)
Total Bilirubin: 0.7 mg/dL (ref 0.3–1.2)
Total Protein: 6.7 g/dL (ref 6.5–8.1)

## 2017-02-21 LAB — CBC WITH DIFFERENTIAL/PLATELET
Basophils Absolute: 0.1 10*3/uL (ref 0–0.1)
Basophils Relative: 1 %
EOS ABS: 0.2 10*3/uL (ref 0–0.7)
EOS PCT: 3 %
HCT: 38.6 % (ref 35.0–47.0)
Hemoglobin: 13.4 g/dL (ref 12.0–16.0)
LYMPHS ABS: 1.9 10*3/uL (ref 1.0–3.6)
Lymphocytes Relative: 28 %
MCH: 33.4 pg (ref 26.0–34.0)
MCHC: 34.7 g/dL (ref 32.0–36.0)
MCV: 96.4 fL (ref 80.0–100.0)
MONOS PCT: 11 %
Monocytes Absolute: 0.8 10*3/uL (ref 0.2–0.9)
Neutro Abs: 3.8 10*3/uL (ref 1.4–6.5)
Neutrophils Relative %: 57 %
PLATELETS: 161 10*3/uL (ref 150–440)
RBC: 4 MIL/uL (ref 3.80–5.20)
RDW: 14.2 % (ref 11.5–14.5)
WBC: 6.7 10*3/uL (ref 3.6–11.0)

## 2017-02-21 LAB — MAGNESIUM: Magnesium: 1.9 mg/dL (ref 1.7–2.4)

## 2017-02-21 MED ORDER — SODIUM CHLORIDE 0.9% FLUSH
10.0000 mL | INTRAVENOUS | Status: DC | PRN
  Administered 2017-02-21: 10 mL via INTRAVENOUS
  Filled 2017-02-21: qty 10

## 2017-02-21 MED ORDER — HEPARIN SOD (PORK) LOCK FLUSH 100 UNIT/ML IV SOLN
500.0000 [IU] | Freq: Once | INTRAVENOUS | Status: AC
  Administered 2017-02-21: 500 [IU] via INTRAVENOUS

## 2017-02-21 NOTE — Assessment & Plan Note (Addendum)
#   DLBCL-status post relapse; status post autologous stem cell transplant.  last treatment in 2015; PET May 2016- NED. Clinically no evidence of recurrence.  # Transient/mild pancytopenia-Question viral. resolved; today CBC within normal limits.   # Chronic respiratory failure on oxygen; multiple lung infections; ? Bronchitis vs pneumonia. Currently improved.  # Hypokalemia-  Potassium 2.8; check Mag; recommend taking BID instead once a day.   Prescription for potassium given.  # Follow-up with me as planned in appx 4 months/labs port flush/ MD; port flush in 2 months.

## 2017-02-21 NOTE — Progress Notes (Signed)
[facility] OFFICE PROGRESS NOTE  Patient Care Team: Madelyn Brunner, MD as PCP - General (Unknown Physician Specialty) Madelyn Brunner, MD as Referring Physician (Unknown Physician Specialty) Forest Gleason, MD (Unknown Physician Specialty) Christene Lye, MD (General Surgery)  Cancer Staging No matching staging information was found for the patient.   Oncology History   1.  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  Status post 6 cycles of chemotherapy with CHOP9nd involved field radiation treatment 18,00 rads to the mantle field  and mediasinal boost 2340. finished radiation and chemotherapy in November of 2000 residual mediastinotomy mass 2 cm 2.  In May of 2001 progressive disease with mediastinal mass becoming bigger 8 cm3.  The patient underwent 2 cycles of chemotherapy withRituxan, and ICE.(June of 2001) Followed by 7more cycles  at [facility] with DHAP.and high-dose chemotherapy with  BEAM finished treatment in October of 2001. patient had significant pancytopenia as well as fever lasting for 3 months after transplant. Recurrent sinus infection and pulmonary infection 3.  Patient  underwent EUS biopsy of which was negative .  (November, 2012) 4.  Recurrent diffuse large cell lymphoma from the right side of the neck lymph node biopsy done in October of 2014;   Started on RICE october 2014 6.ppatient is now being considered for high-dose chemotherapy and stem cell support (March, 2015) 7.2nd stem cell transplant cannot be done because of the  no stem cell to harvest     Lymphoma, non-Hodgkin's ([place])   06/30/2013 Initial Diagnosis    Lymphoma, non-Hodgkin's       Diffuse large B-cell lymphoma of lymph nodes of neck ([place])    INTERVAL HISTORY:  Anna Mcbride 71 y.o.  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  In the interim patient has not had any hospitalizations or needed IV antibiotics. She states to be taking one potassium pill a day. Otherwise denies any  Worsening cough.  No unusual shortness of breath.  No new lumps or bumps. No nausea or vomiting. No significant weight loss.  REVIEW OF SYSTEMS:  A complete 10 point review of system is done which is negative except mentioned above/history of present illness.   PAST MEDICAL HISTORY :  Past Medical History:  Diagnosis Date  . Acute respiratory failure ([place]) 11/29/2014  . 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 Asthma   . Non Hodgkin's lymphoma ([place]) [id number]  . Personal history of chemotherapy     PAST SURGICAL HISTORY :   Past Surgical History:  Procedure Laterality Date  . 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 NASAL SINUS SURGERY  08/07/05 09/09/05   x 2   . VIDEO BRONCHOSCOPY Bilateral 11/30/2014   Procedure: VIDEO BRONCHOSCOPY WITHOUT FLUORO;  Surgeon: Flora Lipps, MD;  Location: [facility] ORS;  Service: Cardiopulmonary;  Laterality: Bilateral;    FAMILY HISTORY :   Family History  Problem Relation Age of Onset  . Asthma Son   . Congestive Heart Failure Mother   . Diabetes Brother   . Pulmonary embolism Father   . Breast cancer Neg Hx     SOCIAL HISTORY:   Social History  Substance Use Topics  . Smoking status: Never Smoker  . Smokeless tobacco: Never Used  . Alcohol use Yes    ALLERGIES:  is  allergic to hydrocodone-homatropine; tussionex pennkinetic er [hydrocod polst-cpm polst er]; and ciprofloxacin.  MEDICATIONS:  Current Outpatient Prescriptions  Medication Sig Dispense Refill  . albuterol (PROVENTIL HFA;VENTOLIN HFA) 108 (90 BASE) MCG/ACT inhaler Inhale 1 puff into the lungs every 6 (six) hours as needed for wheezing or shortness of breath.    [name] albuterol (PROVENTIL) (2.5 MG/3ML) 0.083% nebulizer solution Take 3 mLs (2.5 mg total) by nebulization every 4  (four) hours as needed for wheezing or shortness of breath. 75 mL 12  . AMBULATORY NON FORMULARY MEDICATION Medication Name: Incentive Spirometer Use 10-15 times daily 1 each 0  . aspirin EC 81 MG tablet Take 81 mg by mouth daily.    [name] azithromycin (ZITHROMAX) 500 MG tablet Take 1 tablet (500 mg total) by mouth daily. 4 tablet 0  . budesonide (PULMICORT) 0.5 MG/2ML nebulizer solution Take 2 mLs (0.5 mg total) by nebulization 2 (two) times daily. 120 mL 11  . citalopram (CELEXA) 20 MG tablet TAKE 1 TABLET BY MOUTH ONCE DAILY 90 tablet 3  . dextromethorphan-guaiFENesin (MUCINEX DM) 30-600 MG 12hr tablet Take 1 tablet by mouth daily.    . formoterol (PERFOROMIST) 20 MCG/2ML nebulizer solution Take 2 mLs (20 mcg total) by nebulization 2 (two) times daily. 120 mL 11  . hydrochlorothiazide (HYDRODIURIL) 25 MG tablet Take by mouth.    . levothyroxine (SYNTHROID, LEVOTHROID) 25 MCG tablet TAKE 1 TABLET BY MOUTH ONCE DAILY ON AN EMPTY STOMACH. WAIT 30 MINUTES BEFORE TAKING OTHER MEDS. 30 tablet 4  . nystatin (MYCOSTATIN) 100000 UNIT/ML suspension Take 5 mLs (500,000 Units total) by mouth 4 (four) times daily. 120 mL 0  . omeprazole (PRILOSEC) 20 MG capsule TAKE 1 CAPSULE BY MOUTH TWICE DAILY BEFORE MEALS 180 capsule 3  . potassium chloride SA (K-DUR,KLOR-CON) 20 MEQ tablet TAKE 1 TABLET BY MOUTH TWICE DAILY 30 tablet 2  . Respiratory Therapy Supplies (FLUTTER) DEVI Use 10-15 times daily 1 each 0  . vitamin C (ASCORBIC ACID) 500 MG tablet Take 500 mg by mouth daily.    [name] ALPRAZolam (XANAX) 0.5 MG tablet TAKE 1 TABLET BY MOUTH AT BEDTIME AS NEEDED FOR SLEEP (Patient not taking: Reported on 02/21/2017) 30 tablet 1  . cefUROXime (CEFTIN) 500 MG tablet Take 1 tablet (500 mg total) by mouth 2 (two) times daily with a meal. (Patient not taking: Reported on 02/21/2017) 8 tablet 0  . guaiFENesin-codeine 100-10 MG/5ML syrup Take 5 mLs by mouth every 4 (four) hours as needed for cough. (Patient not taking: Reported on  02/21/2017) 240 mL 0  . ondansetron (ZOFRAN) 4 MG tablet Take 1 tablet (4 mg total) by mouth every 6 (six) hours as needed for nausea or vomiting. (Patient not taking: Reported on 02/21/2017) 30 tablet 2   No current facility-administered medications for this visit.    Facility-Administered Medications Ordered in Other Visits  Medication Dose Route Frequency Provider Last Rate Last Dose  . sodium chloride flush (NS) 0.9 % injection 10 mL  10 mL Intravenous PRN Cammie Sickle, MD   10 mL at 02/21/17 1330    PHYSICAL EXAMINATION: ECOG PERFORMANCE STATUS: 1 - Symptomatic but completely ambulatory  BP 121/76 (BP Location: Right Arm, Patient Position: Sitting)   Pulse 83   Temp 97.6 F (36.4 C) (Tympanic)   Wt 123 lb 6.4 oz (56 kg)   LMP  (LMP Unknown)   BMI 23.32 kg/m   Filed Weights   02/21/17 1341  Weight: 123 lb 6.4 oz (56 kg)  GENERAL: Thin built moderately nourished;  Alert, no distress and comfortable.   Accompanied by her husband.  Wearing oxygen. EYES: no pallor or icterus OROPHARYNX: no thrush or ulceration; good dentition  NECK: supple, no masses felt LYMPH:  no palpable lymphadenopathy in the cervical, axillary or inguinal regions LUNGS: Bilateral coarse breath sounds to auscultation and  No wheeze or crackles HEART/CVS: regular rate & rhythm and no murmurs; No lower extremity edema ABDOMEN:abdomen soft, non-tender and normal bowel sounds Musculoskeletal:no cyanosis of digits and no clubbing  PSYCH: alert & oriented x 3 with fluent speech NEURO: no focal motor/sensory deficits SKIN:  no rashes or significant lesions  LABORATORY DATA:  I have reviewed the data as listed    Component Value Date/Time   NA 143 02/21/2017 1315   NA 140 11/16/2014 1415   K 2.8 (L) 02/21/2017 1315   K 3.3 (L) 11/16/2014 1415   CL 99 (L) 02/21/2017 1315   CL 107 11/16/2014 1415   CO2 36 (H) 02/21/2017 1315   CO2 29 11/16/2014 1415   GLUCOSE 94 02/21/2017 1315   GLUCOSE 117 (H)  11/16/2014 1415   BUN 30 (H) 02/21/2017 1315   BUN 31 (H) 11/16/2014 1415   CREATININE 1.22 (H) 02/21/2017 1315   CREATININE 1.19 (H) 11/16/2014 1415   CALCIUM 9.2 02/21/2017 1315   CALCIUM 9.1 11/16/2014 1415   PROT 6.7 02/21/2017 1315   PROT 6.8 11/16/2014 1415   ALBUMIN 4.0 02/21/2017 1315   ALBUMIN 3.8 11/16/2014 1415   AST 18 02/21/2017 1315   AST 17 11/16/2014 1415   ALT 13 (L) 02/21/2017 1315   ALT 11 (L) 11/16/2014 1415   ALKPHOS 53 02/21/2017 1315   ALKPHOS 115 11/16/2014 1415   BILITOT 0.7 02/21/2017 1315   BILITOT 0.4 11/16/2014 1415   GFRNONAA 43 (L) 02/21/2017 1315   GFRNONAA 46 (L) 11/16/2014 1415   GFRAA 50 (L) 02/21/2017 1315   GFRAA 54 (L) 11/16/2014 1415    No results found for: SPEP, UPEP  Lab Results  Component Value Date   WBC 6.7 02/21/2017   NEUTROABS 3.8 02/21/2017   HGB 13.4 02/21/2017   HCT 38.6 02/21/2017   MCV 96.4 02/21/2017   PLT 161 02/21/2017      Chemistry      Component Value Date/Time   NA 143 02/21/2017 1315   NA 140 11/16/2014 1415   K 2.8 (L) 02/21/2017 1315   K 3.3 (L) 11/16/2014 1415   CL 99 (L) 02/21/2017 1315   CL 107 11/16/2014 1415   CO2 36 (H) 02/21/2017 1315   CO2 29 11/16/2014 1415   BUN 30 (H) 02/21/2017 1315   BUN 31 (H) 11/16/2014 1415   CREATININE 1.22 (H) 02/21/2017 1315   CREATININE 1.19 (H) 11/16/2014 1415      Component Value Date/Time   CALCIUM 9.2 02/21/2017 1315   CALCIUM 9.1 11/16/2014 1415   ALKPHOS 53 02/21/2017 1315   ALKPHOS 115 11/16/2014 1415   AST 18 02/21/2017 1315   AST 17 11/16/2014 1415   ALT 13 (L) 02/21/2017 1315   ALT 11 (L) 11/16/2014 1415   BILITOT 0.7 02/21/2017 1315   BILITOT 0.4 11/16/2014 1415       RADIOGRAPHIC STUDIES: I have personally reviewed the radiological images as listed and agreed with the findings in the report. No results found.   ASSESSMENT & PLAN:  Diffuse large B-cell lymphoma of lymph nodes of neck ([place]) # DLBCL-status post relapse; status post  autologous stem cell  transplant.  last treatment in 2015; PET May 2016- NED. Clinically no evidence of recurrence.  # Transient/mild pancytopenia-Question viral. resolved; today CBC within normal limits.   # Chronic respiratory failure on oxygen; multiple lung infections; ? Bronchitis vs pneumonia. Currently improved.  # Hypokalemia-  Potassium 2.8; check Mag; recommend taking BID instead once a day.   Prescription for potassium given.  # Follow-up with me as planned in appx 4 months/labs port flush/ MD; port flush in 2 months.    Orders Placed This Encounter  Procedures  . Magnesium    Standing Status:   Future    Number of Occurrences:   1    Standing Expiration Date:   02/21/2018   All questions were answered. The patient knows to call the clinic with any problems, questions or concerns.      Cammie Sickle, MD 02/21/2017 2:56 PM

## 2017-02-25 ENCOUNTER — Ambulatory Visit: Attending: Internal Medicine | Primary: Internal Medicine

## 2017-02-25 DIAGNOSIS — R399 Unspecified symptoms and signs involving the genitourinary system: Secondary | ICD-10-CM

## 2017-02-25 DIAGNOSIS — Z7189 Other specified counseling: Principal | ICD-10-CM

## 2017-02-25 DIAGNOSIS — R140 Abdominal distension (gaseous): Secondary | ICD-10-CM

## 2017-02-25 DIAGNOSIS — Z6841 Body Mass Index (BMI) 40.0 and over, adult: Secondary | ICD-10-CM

## 2017-02-25 DIAGNOSIS — I10 Essential (primary) hypertension: Secondary | ICD-10-CM

## 2017-02-25 DIAGNOSIS — E119 Type 2 diabetes mellitus without complications: Principal | ICD-10-CM

## 2017-02-25 DIAGNOSIS — F419 Anxiety disorder, unspecified: Secondary | ICD-10-CM

## 2017-02-25 DIAGNOSIS — E785 Hyperlipidemia, unspecified: Secondary | ICD-10-CM

## 2017-02-25 DIAGNOSIS — F329 Major depressive disorder, single episode, unspecified: Secondary | ICD-10-CM

## 2017-02-25 DIAGNOSIS — M1990 Unspecified osteoarthritis, unspecified site: Secondary | ICD-10-CM

## 2017-02-25 DIAGNOSIS — E781 Pure hyperglyceridemia: Secondary | ICD-10-CM

## 2017-02-25 DIAGNOSIS — K219 Gastro-esophageal reflux disease without esophagitis: Secondary | ICD-10-CM

## 2017-02-25 MED ORDER — DICYCLOMINE HCL 10 MG PO CAPS
10 mg | Freq: Three times a day (TID) | ORAL | 2 refills | Status: CP | PRN
Start: 2017-02-25 — End: 2017-04-28

## 2017-02-25 MED ORDER — FENOFIBRATE 145 MG PO TABS
145 mg | Freq: Every day | ORAL | 3 refills | Status: CP
Start: 2017-02-25 — End: 2017-04-28

## 2017-02-26 NOTE — Progress Notes
Health Maintenance was reviewed. The patient's HM Topic list was:                                            Health Maintenance   Topic Date Due   ? Preventive Wellness Visit  05/28/2017 (Originally 11/16/1963)   ? DTaP,Tdap,and Td Vaccines (1 - Tdap) 05/28/2017 (Originally 11/17/1964)   ? Pneumovax / Prevnar (1 of 2 - PCV13) 05/28/2017 (Originally 11/16/2008)   ? Zoster Vaccine (1 of 2) 05/28/2017 (Originally 11/16/1993)   ? Diabetic Foot Exam  05/28/2017 (Originally 10/23/2016)   ? Diabetic Eye Exam  05/28/2017 (Originally 08/17/2016)   ? Influenza Vaccine (1) 03/22/2017   ? Hemoglobin A1C  08/24/2017   ? Breast Cancer Screening  10/16/2017   ? Lipid Profile  02/21/2018   ? Urine Microalbumin  02/21/2018   ? Creatinine  02/21/2018   ? Basic Metabolic Panel  02/21/2018   ? Dexa  04/18/2025   ? Colon Cancer Screening  06/04/2026   ? USPSTF Hepatitis C Screening  Completed

## 2017-02-26 NOTE — Progress Notes
Subjective:   Destiny RedwoodMary Pittman is a 71 y.o. 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 BP is wnl, she is adherent to low salt diet and complient with current antihypertensive medicine. No chest pressure/discomfort, palpitations, irregular heart beats, near-syncope, syncope. GOA pain, controlled with tyl#3 prn, she needs refill, understand longterm side effects of these meds. Due for repeated CT chest to fu on her lung nodule.  Past Medical History:   Diagnosis Date   ? Anxiety    ? Arthritis    ? Depression    ? Diabetes mellitus    ? GERD (gastroesophageal reflux disease)    ? Hyperlipidemia    ? Hypertension      Past Surgical History:   Procedure Laterality Date   ? HYSTERECTOMY       Family History   Problem Relation Age of Onset   ? No Known Problems Mother    ? No Known Problems Father      Social History     Social History   ? Marital status: Single     Spouse name: N/A   ? Number of children: N/A   ? Years of education: N/A     Occupational History   ? Not on file.     Social History Main Topics   ? Smoking status: Never Smoker   ? Smokeless tobacco: Never Used   ? Alcohol use No   ? Drug use: No   ? Sexual activity: No     Other Topics Concern   ? Not on file     Social History Narrative   ? No narrative on file     Current Outpatient Prescriptions on File Prior to Visit   Medication Sig   ? donepezil (ARICEPT) 5 MG PO Tablet TAKE ONE TABLET BY MOUTH AT BEDTIME .[name].THANK YOU..   ? esomeprazole (NexIUM) 40 MG PO Capsule Delayed Release TAKE ONE CAPSULE EVERY DAY .[name].THANK YOU..   ? fluticasone (FLONASE) 50 MCG/ACT NA Suspension SPRAY ONE PUFF INTO NOSE TWICE A DAY ==SHAKE      GENTLY==   ? fluticasone-salmeterol (ADVAIR DISKUS) 250-50 MCG/DOSE Aerosol Powder

## 2017-02-26 NOTE — Progress Notes
Cardiovascular: Positive for leg swelling. Negative for chest pain and palpitations.        Both legs   Gastrointestinal: Negative.  Negative for abdominal pain, constipation, diarrhea, nausea and vomiting.   Endocrine: Negative.    Genitourinary: Negative.    Musculoskeletal: Positive for arthralgias and myalgias.   Skin: Positive for rash.        Both legs   Allergic/Immunologic: Negative.    Neurological: Positive for numbness.   Hematological: Negative.    Psychiatric/Behavioral: Positive for decreased concentration.         Objective:        VITAL SIGNS (all recorded)      Clinic Vitals     Row Name 02/21/17 1318 02/25/17 1328             Amb Encounter Vitals    Weight 113.9 kg (251 lb)    -TD at 02/21/17 1319 113.9 kg (251 lb)    -TD at 02/25/17 1328      Height 1.626 m (5\' 4" )    -TD at 02/21/17 1319 1.626 m (5\' 4" )    -TD at 02/25/17 1328      BMI (Calculated) 43.17    -TD at 02/21/17 1319 43.17    -TD at 02/25/17 1328      BSA (Calculated - sq m) 2.27    -TD at 02/21/17 1319 2.27    -TD at 02/25/17 1328      BP 150/68    -TD at 02/21/17 1319 136/68    -TD at 02/25/17 1328      BP Location Left upper arm    -TD at 02/21/17 1319 Left upper arm    -TD at 02/25/17 1328      Position Sitting    -TD at 02/21/17 1319 Sitting    -TD at 02/25/17 1328      Pulse 86    -TD at 02/21/17 1319 78    -TD at 02/25/17 1328      Pulse Source Radial    -TD at 02/21/17 1319 Radial    -TD at 02/25/17 1328      Resp 16    -TD at 02/21/17 1319 16    -TD at 02/25/17 1328      Temp 36.2 ?C (97.2 ?F)    -TD at 02/21/17 1319 36.1 ?C (97 ?F)    -TD at 02/25/17 1328      Temperature Source Oral    -TD at 02/21/17 1319 Oral    -TD at 02/25/17 1328      O2 Saturation 98 %    -TD at 02/21/17 1319 98 %    -TD at 02/25/17 1328      FiO2 Source RA    -TD at 02/21/17 1319 RA    -TD at 02/25/17 1328      Pain Score Zero    -TD at 02/21/17 1319 Five    -TD at 02/25/17 1328      Location  ? back    -TD at 02/25/17 1328

## 2017-02-26 NOTE — Progress Notes
Education/Communication Barriers?    Learning/Communication Barriers? No    -TD at 02/21/17 1319 No    -TD at 02/25/17 1328         Fall Risk Assessment    Had recent fall / Last 6 months? No recent fall    -TD at 02/21/17 1319 No recent fall    -TD at 02/25/17 1328      Does patient have a fear of falling? No    -TD at 02/21/17 1319 No    -TD at 02/25/17 1328        User Key  (r) = Recorded By, (t) = Taken By, (c) = Cosigned By    Initials Name Effective Dates    TD Duffy, Tammy, MA 10/24/15 -         Physical Exam   Constitutional: She is oriented to person, place, and time. She appears well-developed and well-nourished.   HENT:   Head: Normocephalic and atraumatic.   Nose: Nose normal.   Eyes: Pupils are equal, round, and reactive to light. Conjunctivae and EOM are normal.   Neck: Normal range of motion. Neck supple.   Cardiovascular: Normal rate, regular rhythm, normal heart sounds and intact distal pulses.    Pulmonary/Chest: Effort normal. She has decreased breath sounds.   Abdominal: Soft. Bowel sounds are normal.   Musculoskeletal: Normal range of motion. She exhibits edema and tenderness.   Allover joints, 2+ edema both legs   Neurological: She is alert and oriented to person, place, and time. She has normal reflexes.   Skin: Skin is warm and dry. There is erythema.        Psychiatric: She has a normal mood and affect. Her behavior is normal. Judgment and thought content normal. Cognition and memory are impaired. She exhibits abnormal recent memory.   Nursing note and vitals reviewed.        Assessment:       ICD-10-CM ICD-9-CM    1. Advanced directives, counseling/discussion Z71.89 V65.49    2. BMI 40.0-44.9, adult Z68.41 V85.41    3. Uncontrolled type 2 diabetes mellitus with diabetic polyneuropathy, with long-term current use of insulin E11.42 250.62 Comprehensive Metabolic Panel    Z79.4 357.2 CBC and Differential    E11.65 V58.67 Hemoglobin A1c w/EAG      Microalbumin/Creat Urine Ratio

## 2017-02-26 NOTE — Progress Notes
Breath Activated Inhale 1 puff 2 times daily. Rinse mouth well and spit after each use.   ? gabapentin (NEURONTIN) 400 MG PO Capsule TAKE 2 CAPSULES BY MOUTH 3 TIMES DAILY.   ? GLOBAL INJECT EASE INSULIN SYR 31G X 5/16" 1 ML Miscellaneous USE ONE TWICE A DAY      .[name].THANK YOU..   ? hydroCHLOROthiazide (HYDRODIURIL) 25 MG PO Tablet TAKE ONE TABLET EVERY DAYFOR FLUIDS .[name].THANK YOU..   ? insulin lispro protamine-lispro (HumaLOG 75/25) vial 60 units qam 30 min before breakfast and 40 unit qpm 30 min before supper   ? lisinopril (PRINIVIL,ZESTRIL) 10 MG PO Tablet TAKE ONE TABLET EVERY DAY.[name].THANK YOU..   ? meclizine (ANTIVERT) 25 MG PO Tablet TAKE ONE OR TWO TABLETS EVERY EIGHT HOURS .[name].THANK YOU..   ? nystatin (MYCOSTATIN) 100000 UNIT/ML Suspension Take 5 mLs by mouth 4 times daily.   ? oxybutynin (DITROPAN) 5 MG PO Tablet TAKE TWO TABLETS TWICE A DAY .[name].THANK YOU..   ? pravastatin (PRAVACHOL) 40 MG PO Tablet TAKE ONE TABLET AT       BEDTIME .[name].THANK YOU..   ? PROAIR HFA 108 (90 Base) MCG/ACT IN Aerosol Solution INHALE 2 PUFFS (INTO LUNGS) EVERY SIX HOURS AS NEEDED --SHAKE WELL--   ? raNITIdine (ZANTAC) 150 MG PO Tablet TAKE ONE TABLET TWICE A  DAY ##THANK YOU##   ? tiZANidine (ZANAFLEX) 2 MG PO Tablet TAKE TWO TABLETS EVERY   EIGHT HOURS (ONLY WHEN   NEEDED) ##THANK YOU##   ? torsemide (DEMADEX) 20 MG PO Tablet TAKE ONE TABLET EVERY    MORNING ##THANK YOU##   ? VITAMIN D-2 50000 UNITS Capsule TAKE ONE CAPSULE BY MOUTHTWO TIMES PER WEEK FOR 6 WEEKS .[name].THANK YOU.[name].     No current facility-administered medications on file prior to visit.      Allergies   Allergen Reactions   ? Asa [Aspirin] Itching   ? Pcn [Penicillins] Itching   ? Sulfa Drugs Itching         Review of Systems  Review of Systems   Constitutional: Positive for fatigue. Negative for chills and fever.   HENT: Negative for congestion, postnasal drip, rhinorrhea and sinus pressure.    Eyes: Negative.    Respiratory: Negative for cough and wheezing.

## 2017-02-26 NOTE — Progress Notes
Reason:  Physician ordered labs  Amount:  1 marble, 1 lav, urine Tubes  Type:  Butterfly  Site:  Hand,right  Reaction:  None    Draw performed by:  [id number],  02/21/2017

## 2017-02-26 NOTE — Progress Notes
Comprehensive Metabolic Panel      CBC and Differential      Hemoglobin A1c w/EAG      Microalbumin/Creat Urine Ratio   4. Essential hypertension, benign I10 401.1 Comprehensive Metabolic Panel      CBC and Differential      Microalbumin/Creat Urine Ratio      Comprehensive Metabolic Panel      CBC and Differential      Microalbumin/Creat Urine Ratio   5. Dyslipidemia E78.5 272.4 Comprehensive Metabolic Panel      CBC and Differential      Lipid Panel      Comprehensive Metabolic Panel      CBC and Differential      Lipid Panel   6. Primary osteoarthritis involving multiple joints M15.0 715.09           Plan:   Order lab  Monitor BS  CT for lung nodule  Continue current meds.  Diet and exercise as long patient can tolerate.  Salt restrictions.  FU with other MDs as scheduled.  Meds benefits and side effects discussed with patient. Patient sounds understanding.  No orders of the following type(s) were placed in this encounter: Medications.     Orders Placed This Encounter   Procedures   ? Comprehensive Metabolic Panel   ? Hemoglobin A1c w/EAG   ? Lipid Panel   ? Microalbumin/Creat Urine Ratio   Teaching re:  DM: untreated elevations of blood sugar can lead to problems with eyes, nerves, kidneys, heart, blood vessels, and integrity of feet.  Discussed pathophysiology of pancreas, insulin, insulin resistance, effect of obesity and smoking, concept of syndrome X  Importance of monitoring blood sugars and recording results is discussed at length.  Patient has materials for keeping records and for measuring.  Educational materials regarding diabetes is given to patient.   Patient is made aware of the importance of monitoring HgbA1c every three to six months, having a yearly dilated eye exam, checking feet regularly for damage to the skin, monitoring cholesterol, seeing an Opthalmologist yearly and maintaining a diet consistent with diabetes care.  Patient given educational material regarding diabetes.

## 2017-02-27 ENCOUNTER — Other Ambulatory Visit: Payer: Self-pay | Admitting: Oncology

## 2017-03-20 ENCOUNTER — Other Ambulatory Visit: Payer: Self-pay | Admitting: Internal Medicine

## 2017-03-20 DIAGNOSIS — J449 Chronic obstructive pulmonary disease, unspecified: Principal | ICD-10-CM

## 2017-03-21 MED ORDER — PROAIR HFA 108 (90 BASE) MCG/ACT IN AERS
1 refills | Status: CP
Start: 2017-03-21 — End: 2017-08-01

## 2017-03-31 DIAGNOSIS — J3089 Other allergic rhinitis: Principal | ICD-10-CM

## 2017-03-31 MED ORDER — FLUTICASONE PROPIONATE 50 MCG/ACT NA SUSP
1 refills | Status: CP
Start: 2017-03-31 — End: 2017-08-01

## 2017-04-07 ENCOUNTER — Encounter: Payer: Self-pay | Admitting: Emergency Medicine

## 2017-04-07 ENCOUNTER — Emergency Department
Admission: EM | Admit: 2017-04-07 | Discharge: 2017-04-07 | Disposition: A | Discharge status: Home / Self Care | Payer: Medicare Other | Attending: Emergency Medicine | Admitting: Emergency Medicine

## 2017-04-07 ENCOUNTER — Telehealth: Payer: Self-pay | Admitting: Internal Medicine

## 2017-04-07 DIAGNOSIS — R531 Weakness: Secondary | ICD-10-CM | POA: Insufficient documentation

## 2017-04-07 DIAGNOSIS — E860 Dehydration: Secondary | ICD-10-CM | POA: Diagnosis not present

## 2017-04-07 DIAGNOSIS — Z5321 Procedure and treatment not carried out due to patient leaving prior to being seen by health care provider: Secondary | ICD-10-CM | POA: Insufficient documentation

## 2017-04-07 LAB — BASIC METABOLIC PANEL
Anion gap: 11 (ref 5–15)
BUN: 25 mg/dL — ABNORMAL HIGH (ref 6–20)
CALCIUM: 9.5 mg/dL (ref 8.9–10.3)
CO2: 29 mmol/L (ref 22–32)
CREATININE: 1.22 mg/dL — AB (ref 0.44–1.00)
Chloride: 93 mmol/L — ABNORMAL LOW (ref 101–111)
GFR, EST AFRICAN AMERICAN: 50 mL/min — AB (ref >60)
GFR, EST NON AFRICAN AMERICAN: 43 mL/min — AB (ref >60)
Glucose, Bld: 97 mg/dL (ref 65–99)
Potassium: 2.8 mmol/L — ABNORMAL LOW (ref 3.5–5.1)
SODIUM: 133 mmol/L — AB (ref 135–145)

## 2017-04-07 LAB — CBC
HCT: 38 % (ref 35.0–47.0)
Hemoglobin: 13.4 g/dL (ref 12.0–16.0)
MCH: 33.4 pg (ref 26.0–34.0)
MCHC: 35.1 g/dL (ref 32.0–36.0)
MCV: 95 fL (ref 80.0–100.0)
PLATELETS: 140 10*3/uL — AB (ref 150–440)
RBC: 4 MIL/uL (ref 3.80–5.20)
RDW: 13.8 % (ref 11.5–14.5)
WBC: 10.9 10*3/uL (ref 3.6–11.0)

## 2017-04-07 NOTE — Telephone Encounter (Signed)
Pt husband calling back  PT is still not feeling well, drinking lots of fluids but has no energy BP is 95/63 and oxygen is still in the 84Y Her systolic pressure has been staying under 100 Please call to advise

## 2017-04-07 NOTE — Telephone Encounter (Signed)
Sonya LM for husband with DK's response.

## 2017-04-07 NOTE — ED Notes (Signed)
Pts husband came to desk and states he is taking her home, pt encouraged to stay, husband states they will call PCP in the AM

## 2017-04-07 NOTE — Telephone Encounter (Signed)
Pt spouse calling stating pt has had a head cold for the past few days States that her BP is normal just has a low amount of energy He is not sure we can call in something to help with this  Please advise

## 2017-04-07 NOTE — ED Triage Notes (Signed)
Pt sent over from PCP for further eval of weakness since last Wednesday. Possible dehydration.

## 2017-04-07 NOTE — Telephone Encounter (Signed)
Please advise on message below.

## 2017-04-07 NOTE — Telephone Encounter (Signed)
Advise patient to got to ER for IVF's and further sepsis workup

## 2017-04-08 MED ORDER — GABAPENTIN 400 MG PO CAPS
0 refills | Status: CP
Start: 2017-04-08 — End: 2017-04-28

## 2017-04-10 DIAGNOSIS — E1121 Type 2 diabetes mellitus with diabetic nephropathy: Principal | ICD-10-CM

## 2017-04-10 MED ORDER — GLOBAL INJECT EASE INSULIN SYR 31G X 5/16" 1 ML MISC
11 refills | Status: CP
Start: 2017-04-10 — End: 2018-05-05

## 2017-04-12 ENCOUNTER — Other Ambulatory Visit: Payer: Self-pay | Admitting: Internal Medicine

## 2017-04-23 ENCOUNTER — Inpatient Hospital Stay: Payer: Medicare Other | Attending: Internal Medicine

## 2017-04-23 DIAGNOSIS — Z8572 Personal history of non-Hodgkin lymphomas: Secondary | ICD-10-CM | POA: Diagnosis present

## 2017-04-23 DIAGNOSIS — Z452 Encounter for adjustment and management of vascular access device: Secondary | ICD-10-CM | POA: Diagnosis not present

## 2017-04-23 DIAGNOSIS — Z95828 Presence of other vascular implants and grafts: Secondary | ICD-10-CM

## 2017-04-23 MED ORDER — SODIUM CHLORIDE 0.9% FLUSH
10.0000 mL | INTRAVENOUS | Status: DC | PRN
  Administered 2017-04-23: 10 mL via INTRAVENOUS
  Filled 2017-04-23: qty 10

## 2017-04-23 MED ORDER — HEPARIN SOD (PORK) LOCK FLUSH 100 UNIT/ML IV SOLN
500.0000 [IU] | Freq: Once | INTRAVENOUS | Status: AC
Start: 2017-04-23 — End: 2017-04-23
  Administered 2017-04-23: 500 [IU] via INTRAVENOUS

## 2017-04-28 ENCOUNTER — Ambulatory Visit: Attending: Internal Medicine | Primary: Internal Medicine

## 2017-04-28 DIAGNOSIS — J449 Chronic obstructive pulmonary disease, unspecified: Secondary | ICD-10-CM

## 2017-04-28 DIAGNOSIS — I10 Essential (primary) hypertension: Secondary | ICD-10-CM

## 2017-04-28 DIAGNOSIS — G309 Alzheimer's disease, unspecified: Secondary | ICD-10-CM

## 2017-04-28 DIAGNOSIS — Z7189 Other specified counseling: Principal | ICD-10-CM

## 2017-04-28 DIAGNOSIS — M1990 Unspecified osteoarthritis, unspecified site: Secondary | ICD-10-CM

## 2017-04-28 DIAGNOSIS — Z6841 Body Mass Index (BMI) 40.0 and over, adult: Secondary | ICD-10-CM

## 2017-04-28 DIAGNOSIS — E785 Hyperlipidemia, unspecified: Secondary | ICD-10-CM

## 2017-04-28 DIAGNOSIS — R140 Abdominal distension (gaseous): Secondary | ICD-10-CM

## 2017-04-28 DIAGNOSIS — F0280 Dementia in other diseases classified elsewhere without behavioral disturbance: Secondary | ICD-10-CM

## 2017-04-28 DIAGNOSIS — F419 Anxiety disorder, unspecified: Secondary | ICD-10-CM

## 2017-04-28 DIAGNOSIS — Z794 Long term (current) use of insulin: Secondary | ICD-10-CM

## 2017-04-28 DIAGNOSIS — F329 Major depressive disorder, single episode, unspecified: Secondary | ICD-10-CM

## 2017-04-28 DIAGNOSIS — K210 Gastro-esophageal reflux disease with esophagitis: Secondary | ICD-10-CM

## 2017-04-28 DIAGNOSIS — K219 Gastro-esophageal reflux disease without esophagitis: Secondary | ICD-10-CM

## 2017-04-28 DIAGNOSIS — E781 Pure hyperglyceridemia: Secondary | ICD-10-CM

## 2017-04-28 DIAGNOSIS — R918 Other nonspecific abnormal finding of lung field: Secondary | ICD-10-CM

## 2017-04-28 DIAGNOSIS — E119 Type 2 diabetes mellitus without complications: Principal | ICD-10-CM

## 2017-04-28 DIAGNOSIS — E1142 Type 2 diabetes mellitus with diabetic polyneuropathy: Secondary | ICD-10-CM

## 2017-04-28 MED ORDER — GABAPENTIN 400 MG PO CAPS
0 refills | Status: CP
Start: 2017-04-28 — End: 2017-09-17

## 2017-04-28 MED ORDER — RANITIDINE HCL 150 MG PO TABS
0 refills | Status: CP
Start: 2017-04-28 — End: 2017-06-24

## 2017-04-28 MED ORDER — FENOFIBRATE 145 MG PO TABS
145 mg | Freq: Every day | ORAL | 0 refills | Status: CP
Start: 2017-04-28 — End: 2017-08-15

## 2017-04-28 MED ORDER — HYDROCHLOROTHIAZIDE 25 MG PO TABS
0 refills | Status: CP
Start: 2017-04-28 — End: 2017-09-17

## 2017-04-28 MED ORDER — DONEPEZIL HCL 5 MG PO TABS
0 refills | Status: CP
Start: 2017-04-28 — End: 2017-06-16

## 2017-04-28 MED ORDER — LISINOPRIL 10 MG PO TABS
0 refills | Status: CP
Start: 2017-04-28 — End: 2017-08-01

## 2017-04-28 MED ORDER — ESOMEPRAZOLE MAGNESIUM 40 MG PO CPDR
0 refills | Status: CP
Start: 2017-04-28 — End: 2017-09-17

## 2017-04-28 MED ORDER — TORSEMIDE 20 MG PO TABS
0 refills | Status: CP
Start: 2017-04-28 — End: 2017-09-17

## 2017-04-28 MED ORDER — DICYCLOMINE HCL 10 MG PO CAPS
10 mg | Freq: Three times a day (TID) | ORAL | 0 refills | Status: CP | PRN
Start: 2017-04-28 — End: 2017-08-29

## 2017-04-28 MED ORDER — PRAVASTATIN SODIUM 40 MG PO TABS
0 refills | Status: CP
Start: 2017-04-28 — End: 2017-08-01

## 2017-04-28 MED ORDER — FLUTICASONE-SALMETEROL 250-50 MCG/DOSE IN AEPB
1 | Freq: Two times a day (BID) | RESPIRATORY_TRACT | 0 refills | Status: CP
Start: 2017-04-28 — End: ?

## 2017-05-06 DIAGNOSIS — M791 Myalgia, unspecified site: Secondary | ICD-10-CM

## 2017-05-07 DIAGNOSIS — M5430 Sciatica, unspecified side: Secondary | ICD-10-CM | POA: Insufficient documentation

## 2017-05-07 DIAGNOSIS — M549 Dorsalgia, unspecified: Secondary | ICD-10-CM

## 2017-05-07 HISTORY — DX: Dorsalgia, unspecified: M54.30

## 2017-05-07 HISTORY — DX: Dorsalgia, unspecified: M54.9

## 2017-05-07 MED ORDER — TIZANIDINE HCL 2 MG PO TABS
0 refills | Status: CP
Start: 2017-05-07 — End: 2017-06-03

## 2017-05-23 ENCOUNTER — Other Ambulatory Visit: Payer: Self-pay | Admitting: Internal Medicine

## 2017-05-23 ENCOUNTER — Encounter: Attending: Internal Medicine | Primary: Internal Medicine

## 2017-05-29 ENCOUNTER — Other Ambulatory Visit: Payer: Self-pay

## 2017-05-29 ENCOUNTER — Observation Stay: Payer: Medicare Other

## 2017-05-29 ENCOUNTER — Observation Stay
Admission: AD | Admit: 2017-05-29 | Discharge: 2017-05-30 | Disposition: A | Discharge status: Home / Self Care | Payer: Medicare Other | Source: Ambulatory Visit | Attending: Internal Medicine | Admitting: Internal Medicine

## 2017-05-29 DIAGNOSIS — J209 Acute bronchitis, unspecified: Secondary | ICD-10-CM | POA: Insufficient documentation

## 2017-05-29 DIAGNOSIS — Z79899 Other long term (current) drug therapy: Secondary | ICD-10-CM | POA: Insufficient documentation

## 2017-05-29 DIAGNOSIS — F419 Anxiety disorder, unspecified: Secondary | ICD-10-CM | POA: Insufficient documentation

## 2017-05-29 DIAGNOSIS — Z66 Do not resuscitate: Secondary | ICD-10-CM | POA: Insufficient documentation

## 2017-05-29 DIAGNOSIS — R197 Diarrhea, unspecified: Secondary | ICD-10-CM | POA: Diagnosis not present

## 2017-05-29 DIAGNOSIS — R0602 Shortness of breath: Secondary | ICD-10-CM

## 2017-05-29 DIAGNOSIS — R112 Nausea with vomiting, unspecified: Secondary | ICD-10-CM | POA: Diagnosis present

## 2017-05-29 DIAGNOSIS — J440 Chronic obstructive pulmonary disease with acute lower respiratory infection: Secondary | ICD-10-CM | POA: Diagnosis not present

## 2017-05-29 DIAGNOSIS — Z8572 Personal history of non-Hodgkin lymphomas: Secondary | ICD-10-CM | POA: Diagnosis not present

## 2017-05-29 DIAGNOSIS — Z881 Allergy status to other antibiotic agents status: Secondary | ICD-10-CM | POA: Insufficient documentation

## 2017-05-29 DIAGNOSIS — Z9481 Bone marrow transplant status: Secondary | ICD-10-CM | POA: Diagnosis not present

## 2017-05-29 DIAGNOSIS — Z9221 Personal history of antineoplastic chemotherapy: Secondary | ICD-10-CM | POA: Diagnosis not present

## 2017-05-29 DIAGNOSIS — K219 Gastro-esophageal reflux disease without esophagitis: Secondary | ICD-10-CM | POA: Diagnosis not present

## 2017-05-29 DIAGNOSIS — Z7982 Long term (current) use of aspirin: Secondary | ICD-10-CM | POA: Insufficient documentation

## 2017-05-29 DIAGNOSIS — Z885 Allergy status to narcotic agent status: Secondary | ICD-10-CM | POA: Diagnosis not present

## 2017-05-29 LAB — BASIC METABOLIC PANEL
ANION GAP: 12 (ref 5–15)
BUN: 31 mg/dL — ABNORMAL HIGH (ref 6–20)
CHLORIDE: 101 mmol/L (ref 101–111)
CO2: 25 mmol/L (ref 22–32)
Calcium: 9.3 mg/dL (ref 8.9–10.3)
Creatinine, Ser: 1.35 mg/dL — ABNORMAL HIGH (ref 0.44–1.00)
GFR calc Af Amer: 44 mL/min — ABNORMAL LOW (ref >60)
GFR, EST NON AFRICAN AMERICAN: 38 mL/min — AB (ref >60)
GLUCOSE: 85 mg/dL (ref 65–99)
POTASSIUM: 3.2 mmol/L — AB (ref 3.5–5.1)
Sodium: 138 mmol/L (ref 135–145)

## 2017-05-29 LAB — CBC
HEMATOCRIT: 39.5 % (ref 35.0–47.0)
HEMOGLOBIN: 13.3 g/dL (ref 12.0–16.0)
MCH: 32.6 pg (ref 26.0–34.0)
MCHC: 33.6 g/dL (ref 32.0–36.0)
MCV: 96.9 fL (ref 80.0–100.0)
PLATELETS: 120 10*3/uL — AB (ref 150–440)
RBC: 4.07 MIL/uL (ref 3.80–5.20)
RDW: 15.3 % — ABNORMAL HIGH (ref 11.5–14.5)
WBC: 3.5 10*3/uL — AB (ref 3.6–11.0)

## 2017-05-29 LAB — MAGNESIUM: Magnesium: 1.7 mg/dL (ref 1.7–2.4)

## 2017-05-29 MED ORDER — LEVOTHYROXINE SODIUM 50 MCG PO TABS
25.0000 ug | ORAL_TABLET | Freq: Every day | ORAL | Status: DC
  Administered 2017-05-30: 25 ug via ORAL
  Filled 2017-05-29: qty 1

## 2017-05-29 MED ORDER — ALBUTEROL SULFATE (2.5 MG/3ML) 0.083% IN NEBU: 2.5000 mg | INHALATION_SOLUTION | Freq: Four times a day (QID) | RESPIRATORY_TRACT | Status: DC | PRN

## 2017-05-29 MED ORDER — ONDANSETRON HCL 4 MG PO TABS: 4.0000 mg | ORAL_TABLET | Freq: Four times a day (QID) | ORAL | Status: DC | PRN

## 2017-05-29 MED ORDER — ARFORMOTEROL TARTRATE 15 MCG/2ML IN NEBU
15.0000 ug | INHALATION_SOLUTION | Freq: Two times a day (BID) | RESPIRATORY_TRACT | Status: DC
  Administered 2017-05-29 – 2017-05-30 (×2): 15 ug via RESPIRATORY_TRACT
  Filled 2017-05-29 (×3): qty 2

## 2017-05-29 MED ORDER — ENOXAPARIN SODIUM 30 MG/0.3ML ~~LOC~~ SOLN
30.0000 mg | SUBCUTANEOUS | Status: DC
  Administered 2017-05-29: 30 mg via SUBCUTANEOUS
  Filled 2017-05-29: qty 0.3

## 2017-05-29 MED ORDER — ACETAMINOPHEN 325 MG PO TABS: 650.0000 mg | ORAL_TABLET | Freq: Four times a day (QID) | ORAL | Status: DC | PRN

## 2017-05-29 MED ORDER — VITAMIN C 500 MG PO TABS
500.0000 mg | ORAL_TABLET | Freq: Every day | ORAL | Status: DC
  Administered 2017-05-29 – 2017-05-30 (×2): 500 mg via ORAL
  Filled 2017-05-29 (×2): qty 1

## 2017-05-29 MED ORDER — ORAL CARE MOUTH RINSE
15.0000 mL | Freq: Two times a day (BID) | OROMUCOSAL | Status: DC
  Administered 2017-05-29: 15 mL via OROMUCOSAL

## 2017-05-29 MED ORDER — ASPIRIN EC 81 MG PO TBEC
81.0000 mg | DELAYED_RELEASE_TABLET | Freq: Every day | ORAL | Status: DC
  Administered 2017-05-29 – 2017-05-30 (×2): 81 mg via ORAL
  Filled 2017-05-29 (×2): qty 1

## 2017-05-29 MED ORDER — POTASSIUM CHLORIDE CRYS ER 20 MEQ PO TBCR
20.0000 meq | EXTENDED_RELEASE_TABLET | Freq: Two times a day (BID) | ORAL | Status: DC
  Administered 2017-05-29 – 2017-05-30 (×2): 20 meq via ORAL
  Filled 2017-05-29 (×2): qty 1

## 2017-05-29 MED ORDER — CITALOPRAM HYDROBROMIDE 20 MG PO TABS
20.0000 mg | ORAL_TABLET | Freq: Every day | ORAL | Status: DC
  Administered 2017-05-29 – 2017-05-30 (×2): 20 mg via ORAL
  Filled 2017-05-29 (×2): qty 1

## 2017-05-29 MED ORDER — SODIUM CHLORIDE 0.9 % IV SOLN
INTRAVENOUS | Status: DC
  Administered 2017-05-29 – 2017-05-30 (×2): via INTRAVENOUS

## 2017-05-29 MED ORDER — PANTOPRAZOLE SODIUM 40 MG PO TBEC
40.0000 mg | DELAYED_RELEASE_TABLET | Freq: Every day | ORAL | Status: DC
  Administered 2017-05-29 – 2017-05-30 (×2): 40 mg via ORAL
  Filled 2017-05-29 (×2): qty 1

## 2017-05-29 MED ORDER — ALBUTEROL SULFATE (2.5 MG/3ML) 0.083% IN NEBU: 2.5000 mg | INHALATION_SOLUTION | RESPIRATORY_TRACT | Status: DC | PRN

## 2017-05-29 MED ORDER — ONDANSETRON HCL 4 MG/2ML IJ SOLN
4.0000 mg | Freq: Four times a day (QID) | INTRAMUSCULAR | Status: DC | PRN
Start: 2017-05-29 — End: 2017-05-30
  Administered 2017-05-29 – 2017-05-30 (×2): 4 mg via INTRAVENOUS
  Filled 2017-05-29 (×2): qty 2

## 2017-05-29 MED ORDER — ACETAMINOPHEN 650 MG RE SUPP: 650.0000 mg | Freq: Four times a day (QID) | RECTAL | Status: DC | PRN

## 2017-05-29 NOTE — Progress Notes (Signed)
Anticoagulation monitoring(Lovenox):  71 yo female ordered Lovenox 40 mg Q24h  Filed Weights   05/29/17 1810  Weight: 117 lb (53.1 kg)   BMI    Lab Results  Component Value Date   CREATININE 1.35 (H) 05/29/2017   CREATININE 1.22 (H) 04/07/2017   CREATININE 1.22 (H) 02/21/2017   Estimated Creatinine Clearance: 28 mL/min (A) (by C-G formula based on SCr of 1.35 mg/dL (H)). Hemoglobin & Hematocrit     Component Value Date/Time   HGB 13.3 05/29/2017 1822   HGB 11.4 (L) 11/16/2014 1415   HCT 39.5 05/29/2017 1822   HCT 33.6 (L) 11/16/2014 1415     Per Protocol for Patient with estCrcl < 30 ml/min and BMI < 40, will transition to Lovenox 30 mg Q24h.

## 2017-05-29 NOTE — H&P (Signed)
[facility] NAME: Anna Mcbride    MR#:  [id number]  DATE OF BIRTH:  16-Dec-1945  DATE OF ADMISSION:  05/29/2017  PRIMARY CARE PHYSICIAN: Madelyn Brunner, MD   REQUESTING/REFERRING PHYSICIAN: Dr. Harrel Lemon  CHIEF COMPLAINT:  No chief complaint on file. Nausea vomiting and diarrhea  HISTORY OF PRESENT ILLNESS:  Anna Mcbride  is a 71 y.o. female with a known history of COPD, non-Hodgkin's lymphoma, chronic respiratory failure, status post bone marrow transplant, anemia of chronic disease, anxiety, GERD who presents to the hospital due to nausea vomiting and diarrhea. Patient apparently saw her primary care physician due to a respiratory illness she developed last week. She was seen by primary care physician and started on oral Augmentin this past Monday. After starting the antibiotic patient started developing nausea vomiting and diarrhea and therefore presented back to her primary care physician today who referred her to the hospital for direct admission. Patient says that her primary problem is her nausea and not being able to eat or drink anything over the past few days. She denies any chest pains, abdominal pains, melena, hematochezia, hemoptysis or any other associated symptoms presently.  PAST MEDICAL HISTORY:   Past Medical History:  Diagnosis Date  . Acute respiratory failure ([place]) 11/29/2014  . 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 Asthma   . Non Hodgkin's lymphoma ([place]) [id number]  . Personal history of chemotherapy     PAST SURGICAL HISTORY:   Past Surgical History:  Procedure Laterality Date  . 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 NASAL SINUS SURGERY  08/07/05 09/09/05   x 2     SOCIAL HISTORY:   Social History   Tobacco Use  . Smoking status: Never Smoker  . Smokeless tobacco: Never Used  Substance Use Topics  . Alcohol use: Yes    FAMILY HISTORY:   Family History  Problem Relation Age of Onset  .  Asthma Son   . Congestive Heart Failure Mother   . Diabetes Brother   . Pulmonary embolism Father   . Breast cancer Neg Hx     DRUG ALLERGIES:   Allergies  Allergen Reactions  . Hydrocodone-Homatropine Itching  . Tussionex Pennkinetic Er [Hydrocod Polst-Cpm Polst Er] Itching  . Ciprofloxacin Rash    REVIEW OF SYSTEMS:   Review of Systems  Constitutional: Negative for fever and weight loss.  HENT: Negative for congestion, nosebleeds and tinnitus.   Eyes: Negative for blurred vision, double vision and redness.  Respiratory: Negative for cough, hemoptysis and shortness of breath.   Cardiovascular: Negative for chest pain, orthopnea, leg swelling and PND.  Gastrointestinal: Positive for diarrhea, nausea and vomiting. Negative for abdominal pain and melena.  Genitourinary: Negative for dysuria, hematuria and urgency.  Musculoskeletal: Negative for falls and joint pain.  Neurological: Negative for dizziness, tingling, sensory change, focal weakness, seizures, weakness and headaches.  Endo/Heme/Allergies: Negative for polydipsia. Does not bruise/bleed easily.  Psychiatric/Behavioral: Negative for depression and memory loss. The patient is not nervous/anxious.     MEDICATIONS AT HOME:   Prior to Admission medications   Medication Sig Start Date End Date Taking? Authorizing Provider  albuterol (PROVENTIL HFA;VENTOLIN HFA) 108 (90 BASE) MCG/ACT inhaler Inhale 1 puff into the lungs every 6 (six) hours as needed for wheezing or shortness of breath.    [provider]  albuterol (PROVENTIL) (2.5 MG/3ML) 0.083% nebulizer solution Take 3 mLs (2.5 mg total) by nebulization every 4 (four) hours  as needed for wheezing or shortness of breath. 01/06/17   Flora Lipps, MD  ALPRAZolam Duanne Moron) 0.5 MG tablet TAKE 1 TABLET BY MOUTH AT BEDTIME AS NEEDED FOR SLEEP Patient not taking: Reported on 02/21/2017 12/10/16   Flora Lipps, MD  AMBULATORY NON FORMULARY MEDICATION Medication Name: Incentive  Spirometer Use 10-15 times daily 07/29/16   Flora Lipps, MD  aspirin EC 81 MG tablet Take 81 mg by mouth daily.    [provider]  azithromycin (ZITHROMAX) 500 MG tablet Take 1 tablet (500 mg total) by mouth daily. 11/30/16   Fritzi Mandes, MD  budesonide (PULMICORT) 0.5 MG/2ML nebulizer solution Take 2 mLs (0.5 mg total) by nebulization 2 (two) times daily. 01/06/17   Flora Lipps, MD  cefUROXime (CEFTIN) 500 MG tablet Take 1 tablet (500 mg total) by mouth 2 (two) times daily with a meal. Patient not taking: Reported on 02/21/2017 11/29/16   Fritzi Mandes, MD  citalopram (CELEXA) 20 MG tablet TAKE 1 TABLET BY MOUTH ONCE DAILY 05/23/17   Cammie Sickle, MD  dextromethorphan-guaiFENesin [facility] DM) 30-600 MG 12hr tablet Take 1 tablet by mouth daily.    [provider]  formoterol (PERFOROMIST) 20 MCG/2ML nebulizer solution Take 2 mLs (20 mcg total) by nebulization 2 (two) times daily. 01/06/17   Flora Lipps, MD  guaiFENesin-codeine 100-10 MG/5ML syrup Take 5 mLs by mouth every 4 (four) hours as needed for cough. Patient not taking: Reported on 02/21/2017 09/23/16   Flora Lipps, MD  hydrochlorothiazide (HYDRODIURIL) 25 MG tablet Take by mouth. 01/31/17 01/31/18  [provider]  levothyroxine (SYNTHROID, LEVOTHROID) 25 MCG tablet TAKE 1 TABLET BY MOUTH ONCE DAILY ON AN EMPTY STOMACH. WAIT 30 MINUTES BEFORE TAKING OTHER MEDS. 12/05/16   Cammie Sickle, MD  nystatin (MYCOSTATIN) 100000 UNIT/ML suspension Take 5 mLs (500,000 Units total) by mouth 4 (four) times daily. 11/21/16   Cammie Sickle, MD  omeprazole (PRILOSEC) 20 MG capsule TAKE 1 CAPSULE BY MOUTH TWICE DAILY BEFORE MEALS 02/03/17   Cammie Sickle, MD  ondansetron (ZOFRAN) 4 MG tablet Take 1 tablet (4 mg total) by mouth every 6 (six) hours as needed for nausea or vomiting. Patient not taking: Reported on 02/21/2017 11/07/16   Flora Lipps, MD  potassium chloride SA (K-DUR,KLOR-CON) 20 MEQ tablet TAKE 1 TABLET  BY MOUTH TWICE DAILY 01/06/17   Lloyd Huger, MD  potassium chloride SA (K-DUR,KLOR-CON) 20 MEQ tablet TAKE 1 TABLET BY MOUTH TWICE DAILY 04/14/17   Cammie Sickle, MD  Respiratory Therapy Supplies (FLUTTER) DEVI Use 10-15 times daily 07/29/16   Flora Lipps, MD  vitamin C (ASCORBIC ACID) 500 MG tablet Take 500 mg by mouth daily.    [provider]      VITAL SIGNS:  Blood pressure 130/62, pulse 82, temperature 98 F (36.7 C), temperature source Oral, SpO2 100 %.  PHYSICAL EXAMINATION:  Physical Exam  GENERAL:  71 y.o.-year-old thin patient lying in bed in no acute distress.  EYES: Pupils equal, round, reactive to light and accommodation. No scleral icterus. Extraocular muscles intact.  HEENT: Head atraumatic, normocephalic. Oropharynx and nasopharynx clear. No oropharyngeal erythema, dry oral mucosa  NECK:  Supple, no jugular venous distention. No thyroid enlargement, no tenderness.  LUNGS: Normal breath sounds bilaterally, minimal end-exp. Wheezing, rhonchi, No rales. No use of accessory muscles of respiration.  CARDIOVASCULAR: S1, S2 RRR. No murmurs, rubs, gallops, clicks.  ABDOMEN: Soft, nontender, nondistended. Bowel sounds present. No organomegaly or mass.  EXTREMITIES: No pedal edema,  cyanosis, or clubbing. + 2 pedal & radial pulses b/l.   NEUROLOGIC: Cranial nerves II through XII are intact. No focal Motor or sensory deficits appreciated b/l. Globally weak.  PSYCHIATRIC: The patient is alert and oriented x 3.  SKIN: No obvious rash, lesion, or ulcer.   LABORATORY PANEL:   CBC No results for input(s): WBC, HGB, HCT, PLT in the last 168 hours. ------------------------------------------------------------------------------------------------------------------  Chemistries  No results for input(s): NA, K, CL, CO2, GLUCOSE, BUN, CREATININE, CALCIUM, MG, AST, ALT, ALKPHOS, BILITOT in the last 168 hours.  Invalid input(s):  GFRCGP ------------------------------------------------------------------------------------------------------------------  Cardiac Enzymes No results for input(s): TROPONINI in the last 168 hours. ------------------------------------------------------------------------------------------------------------------  RADIOLOGY:  No results found.   IMPRESSION AND PLAN:   71 year old female with past medical history of chronic respiratory failure, history of non-Hodgkin's lymphoma status post bone marrow transplant, hypertension, GERD, anemia of chronic disease who presented to the hospital due to nausea vomiting and diarrhea.  1. Intractable nausea vomiting and diarrhea-secondary to patient's Augmentin she was receiving. -Supportive care with IV fluids, antiemetics and clear liquid diet. We will check her CBC and met B and replace electrolytes accordingly. -If patient has elevated white cell count CONTINUES to have diarrhea or is febrile we'll check stool for C. difficile.  2. Shortness of breath-seems chronic for the patient. As per the patient's primary care physician she may have aspirated due to the nausea vomiting. I will check a chest x-ray, and if shows any findings I will start her empirically on antibiotics.  3. Hypothyroidism-continue Synthroid.  4. GERD-continue Protonix.  5. Anxiety/depression-continue Xanax/Celexa    All the records are reviewed and case discussed with ED provider. Management plans discussed with the patient, family and they are in agreement.  CODE STATUS: Full code  TOTAL TIME TAKING CARE OF THIS PATIENT: 45 minutes.    Henreitta Leber M.D on 05/29/2017 at 6:11 PM  Between 7am to 6pm - Pager - [phone]  After 6pm go to [email or web address] - password [email or web address] [facility]  [facility]  Office  [phone]  CC: Primary care physician; Madelyn Brunner, MD

## 2017-05-30 DIAGNOSIS — R112 Nausea with vomiting, unspecified: Secondary | ICD-10-CM | POA: Diagnosis not present

## 2017-05-30 LAB — CBC
HCT: 35.7 % (ref 35.0–47.0)
HEMOGLOBIN: 12.2 g/dL (ref 12.0–16.0)
MCH: 33.1 pg (ref 26.0–34.0)
MCHC: 34.3 g/dL (ref 32.0–36.0)
MCV: 96.6 fL (ref 80.0–100.0)
Platelets: 97 10*3/uL — ABNORMAL LOW (ref 150–440)
RBC: 3.69 MIL/uL — AB (ref 3.80–5.20)
RDW: 14.8 % — ABNORMAL HIGH (ref 11.5–14.5)
WBC: 2.8 10*3/uL — ABNORMAL LOW (ref 3.6–11.0)

## 2017-05-30 LAB — BASIC METABOLIC PANEL
ANION GAP: 8 (ref 5–15)
BUN: 29 mg/dL — ABNORMAL HIGH (ref 6–20)
CALCIUM: 8.5 mg/dL — AB (ref 8.9–10.3)
CHLORIDE: 106 mmol/L (ref 101–111)
CO2: 24 mmol/L (ref 22–32)
Creatinine, Ser: 1.26 mg/dL — ABNORMAL HIGH (ref 0.44–1.00)
GFR calc non Af Amer: 41 mL/min — ABNORMAL LOW (ref >60)
GFR, EST AFRICAN AMERICAN: 48 mL/min — AB (ref >60)
GLUCOSE: 78 mg/dL (ref 65–99)
Potassium: 3.5 mmol/L (ref 3.5–5.1)
Sodium: 138 mmol/L (ref 135–145)

## 2017-05-30 MED ORDER — PREDNISONE 50 MG PO TABS
50.0000 mg | ORAL_TABLET | Freq: Once | ORAL | Status: AC
  Administered 2017-05-30: 50 mg via ORAL
  Filled 2017-05-30: qty 1

## 2017-05-30 MED ORDER — PREDNISONE 50 MG PO TABS: 50.0000 mg | ORAL_TABLET | Freq: Every day | ORAL | 0 refills | Status: DC

## 2017-05-30 MED ORDER — ONDANSETRON 4 MG PO TBDP: 4.0000 mg | ORAL_TABLET | Freq: Three times a day (TID) | ORAL | 0 refills | Status: DC | PRN

## 2017-05-30 MED ORDER — FENTANYL CITRATE (PF) 100 MCG/2ML IJ SOLN
INTRAMUSCULAR | Status: AC
  Filled 2017-05-30: qty 2

## 2017-05-30 MED ORDER — MIDAZOLAM HCL 2 MG/2ML IJ SOLN
INTRAMUSCULAR | Status: AC
  Filled 2017-05-30: qty 4

## 2017-05-30 NOTE — Progress Notes (Signed)
Discharge order received. Patient is alert and oriented. Vital signs stable . No signs of acute distress. Discharge instructions given. Patient verbalized understanding. No other issues noted at this time.   

## 2017-05-30 NOTE — Care Management Obs Status (Signed)
[place] NOTIFICATION   Patient Details  Name: Anna Mcbride MRN: [id number] Date of Birth: 1945/09/27   Medicare Observation Status Notification Given:  No(admitted obs less than 24 hours)    Beverly Sessions, RN 05/30/2017, 10:05 AM

## 2017-05-30 NOTE — Discharge Instructions (Addendum)
Resume diet and activity as before ° ° °

## 2017-05-31 DIAGNOSIS — M791 Myalgia, unspecified site: Principal | ICD-10-CM

## 2017-06-03 MED ORDER — TIZANIDINE HCL 2 MG PO TABS
0 refills | Status: CP
Start: 2017-06-03 — End: 2017-07-07

## 2017-06-03 NOTE — Discharge Summary (Signed)
[facility] NAME: Anna Mcbride    MR#:  [id number]  DATE OF BIRTH:  01/13/1946  DATE OF ADMISSION:  05/29/2017 ADMITTING PHYSICIAN: Henreitta Leber, MD  DATE OF DISCHARGE: 05/30/2017  1:49 PM  PRIMARY CARE PHYSICIAN: Madelyn Brunner, MD   ADMISSION DIAGNOSIS:  Intractable Nausea Vomiting Dehydration chronic respiratory failure  DISCHARGE DIAGNOSIS:  Active Problems:   Intractable nausea and vomiting   SECONDARY DIAGNOSIS:   Past Medical History:  Diagnosis Date  . Acute respiratory failure ([place]) 11/29/2014  . 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 Asthma   . Non Hodgkin's lymphoma ([place]) [id number]  . Personal history of chemotherapy      ADMITTING HISTORY  Anna Mcbride  is a 71 y.o. female with a known history of COPD, non-Hodgkin's lymphoma, chronic respiratory failure, status post bone marrow transplant, anemia of chronic disease, anxiety, GERD who presents to the hospital due to nausea vomiting and diarrhea. Patient apparently saw her primary care physician due to a respiratory illness she developed last week. She was seen by primary care physician and started on oral Augmentin this past Monday. After starting the antibiotic patient started developing nausea vomiting and diarrhea and therefore presented back to her primary care physician today who referred her to the hospital for direct admission. Patient says that her primary problem is her nausea and not being able to eat or drink anything over the past few days. She denies any chest pains, abdominal pains, melena, hematochezia, hemoptysis or any other associated symptoms presently  HOSPITAL COURSE:   *Nausea vomiting and diarrhea likely due to Augmentin.  This was started for acute bronchitis.  Patient did not have any fever and normal WBC.  With symptomatic management and IV fluids her vomiting and diarrhea have resolved.  Tolerating diet.  *Acute bronchitis.  Patient's  Augmentin has been stopped.  Started on prednisone.  No pneumonia.  Other comorbidities remained stable.  Patient is close to baseline and has no further vomiting or diarrhea and tolerating diet.  Discharged home in stable condition.  CONSULTS OBTAINED:    DRUG ALLERGIES:   Allergies  Allergen Reactions  . Hydrocodone-Homatropine Itching  . Tussionex Pennkinetic Er [Hydrocod Polst-Cpm Polst Er] Itching  . Ciprofloxacin Rash    DISCHARGE MEDICATIONS:   Discharge Medication List as of 05/30/2017 10:23 AM    START taking these medications   Details  ondansetron (ZOFRAN ODT) 4 MG disintegrating tablet Take 1 tablet (4 mg total) every 8 (eight) hours as needed by mouth for nausea or vomiting., Starting Fri 05/30/2017, Normal    predniSONE (DELTASONE) 50 MG tablet Take 1 tablet (50 mg total) daily by mouth., Starting Sat 05/31/2017, Normal      CONTINUE these medications which have NOT CHANGED   Details  albuterol (PROVENTIL HFA;VENTOLIN HFA) 108 (90 BASE) MCG/ACT inhaler Inhale 1 puff into the lungs every 6 (six) hours as needed for wheezing or shortness of breath., Historical Med    albuterol (PROVENTIL) (2.5 MG/3ML) 0.083% nebulizer solution Take 3 mLs (2.5 mg total) by nebulization every 4 (four) hours as needed for wheezing or shortness of breath., Starting Mon 01/06/2017, Normal    aspirin EC 81 MG tablet Take 81 mg by mouth daily., Historical Med    citalopram (CELEXA) 20 MG tablet TAKE 1 TABLET BY MOUTH ONCE DAILY, Normal    formoterol (PERFOROMIST) 20 MCG/2ML nebulizer solution Take 2 mLs (20 mcg total) by nebulization 2 (two) times  daily., Starting Mon 01/06/2017, Normal    levothyroxine (SYNTHROID, LEVOTHROID) 25 MCG tablet TAKE 1 TABLET BY MOUTH ONCE DAILY ON AN EMPTY STOMACH. WAIT 30 MINUTES BEFORE TAKING OTHER MEDS., Normal    omeprazole (PRILOSEC) 20 MG capsule TAKE 1 CAPSULE BY MOUTH TWICE DAILY BEFORE MEALS, Normal    !! potassium chloride SA (K-DUR,KLOR-CON) 20 MEQ  tablet TAKE 1 TABLET BY MOUTH TWICE DAILY, Normal    vitamin C (ASCORBIC ACID) 500 MG tablet Take 500 mg by mouth daily., Historical Med    amoxicillin-clavulanate (AUGMENTIN) 875-125 MG tablet Take 1 tablet daily by mouth., Starting Mon 05/26/2017, Until Thu 06/05/2017, Historical Med    ALPRAZolam (XANAX) 0.5 MG tablet TAKE 1 TABLET BY MOUTH AT BEDTIME AS NEEDED FOR SLEEP, Print    AMBULATORY NON FORMULARY MEDICATION Medication Name: Incentive Spirometer Use 10-15 times daily, Print    !! potassium chloride SA (K-DUR,KLOR-CON) 20 MEQ tablet TAKE 1 TABLET BY MOUTH TWICE DAILY, Normal    Respiratory Therapy Supplies (FLUTTER) DEVI Use 10-15 times daily, Print     !! - Potential duplicate medications found. Please discuss with provider.    STOP taking these medications     budesonide (PULMICORT) 0.5 MG/2ML nebulizer solution      azithromycin (ZITHROMAX) 500 MG tablet      cefUROXime (CEFTIN) 500 MG tablet      guaiFENesin-codeine 100-10 MG/5ML syrup      nystatin (MYCOSTATIN) 100000 UNIT/ML suspension      ondansetron (ZOFRAN) 4 MG tablet         Today   VITAL SIGNS:  Blood pressure (!) 112/50, pulse 76, temperature 97.7 F (36.5 C), temperature source Oral, resp. rate 16, height 5\' 1"  (1.549 m), weight 53.1 kg (117 lb), SpO2 100 %.  I/O:  No intake or output data in the 24 hours ending 06/03/17 1558  PHYSICAL EXAMINATION:  Physical Exam  GENERAL:  71 y.o.-year-old patient lying in the bed with no acute distress.  LUNGS: Normal breath sounds bilaterally, no wheezing, rales,rhonchi or crepitation. No use of accessory muscles of respiration.  CARDIOVASCULAR: S1, S2 normal. No murmurs, rubs, or gallops.  ABDOMEN: Soft, non-tender, non-distended. Bowel sounds present. No organomegaly or mass.  NEUROLOGIC: Moves all 4 extremities. PSYCHIATRIC: The patient is alert and oriented x 3.  SKIN: No obvious rash, lesion, or ulcer.   DATA REVIEW:   CBC Recent Labs  Lab  05/30/17 0506  WBC 2.8*  HGB 12.2  HCT 35.7  PLT 97*    Chemistries  Recent Labs  Lab 05/29/17 1822 05/30/17 0506  NA 138 138  K 3.2* 3.5  CL 101 106  CO2 25 24  GLUCOSE 85 78  BUN 31* 29*  CREATININE 1.35* 1.26*  CALCIUM 9.3 8.5*  MG 1.7  --     Cardiac Enzymes No results for input(s): TROPONINI in the last 168 hours.  Microbiology Results  Results for orders placed or performed during the hospital encounter of 11/26/16  Culture, blood (Routine x 2)     Status: None   Collection Time: 11/26/16 12:58 PM  Result Value Ref Range Status   Specimen Description BLOOD LEFT ASSIST CONTROL  Final   Special Requests   Final    BOTTLES DRAWN AEROBIC AND ANAEROBIC Blood Culture results may not be optimal due to an excessive volume of blood received in culture bottles   Culture NO GROWTH 5 DAYS  Final   Report Status 12/01/2016 FINAL  Final  Culture, blood (Routine x 2)  Status: None   Collection Time: 11/26/16 12:58 PM  Result Value Ref Range Status   Specimen Description BLOOD RIGHT ARM  Final   Special Requests   Final    BOTTLES DRAWN AEROBIC AND ANAEROBIC Blood Culture adequate volume   Culture NO GROWTH 5 DAYS  Final   Report Status 12/01/2016 FINAL  Final  MRSA PCR Screening     Status: None   Collection Time: 11/26/16  6:56 PM  Result Value Ref Range Status   MRSA by PCR NEGATIVE NEGATIVE Final    Comment:        The GeneXpert MRSA Assay (FDA approved for NASAL specimens only), is one component of a comprehensive MRSA colonization surveillance program. It is not intended to diagnose MRSA infection nor to guide or monitor treatment for MRSA infections.     RADIOLOGY:  No results found.  Follow up with PCP in 1 week.  Management plans discussed with the patient, family and they are in agreement.  CODE STATUS:  Code Status History    Date Active Date Inactive Code Status Order ID Comments User Context   05/29/2017 17:40 05/30/2017 16:49 Full Code  [id number]  Henreitta Leber, MD Inpatient   11/26/2016 18:44 11/29/2016 15:13 Full Code [id number]  Henreitta Leber, MD Inpatient   09/12/2016 02:23 09/13/2016 17:56 DNR [id number]  Harrie Foreman, MD ED   06/18/2016 18:21 06/20/2016 15:35 DNR [id number]  Vaughan Basta, MD Inpatient   11/29/2014 14:05 12/05/2014 15:50 DNR [id number]  Evlyn Kanner, NP Inpatient    Advance Directive Documentation     Most Recent Value  Type of Advance Directive  Healthcare Power of Attorney  Pre-existing out of facility DNR order (yellow form or pink MOST form)  No data  "MOST" Form in Place?  No data      TOTAL TIME TAKING CARE OF THIS PATIENT ON DAY OF DISCHARGE: more than 30 minutes.   Hillary Bow R M.D on 06/03/2017 at 3:58 PM  Between 7am to 6pm - Pager - [phone]  After 6pm go to [email or web address] - password [email or web address] [facility]  Office  [phone]  CC: Primary care physician; Madelyn Brunner, MD  Note: This dictation was prepared with Dragon dictation along with smaller phrase technology. Any transcriptional errors that result from this process are unintentional.

## 2017-06-08 ENCOUNTER — Other Ambulatory Visit: Payer: Self-pay | Admitting: Internal Medicine

## 2017-06-08 DIAGNOSIS — E039 Hypothyroidism, unspecified: Secondary | ICD-10-CM

## 2017-06-11 ENCOUNTER — Ambulatory Visit: Attending: Family | Primary: Internal Medicine

## 2017-06-11 ENCOUNTER — Encounter: Attending: Family | Primary: Internal Medicine

## 2017-06-11 DIAGNOSIS — R300 Dysuria: Secondary | ICD-10-CM

## 2017-06-11 DIAGNOSIS — Z6841 Body Mass Index (BMI) 40.0 and over, adult: Secondary | ICD-10-CM

## 2017-06-11 DIAGNOSIS — E785 Hyperlipidemia, unspecified: Secondary | ICD-10-CM

## 2017-06-11 DIAGNOSIS — E119 Type 2 diabetes mellitus without complications: Principal | ICD-10-CM

## 2017-06-11 DIAGNOSIS — K219 Gastro-esophageal reflux disease without esophagitis: Secondary | ICD-10-CM

## 2017-06-11 DIAGNOSIS — M1990 Unspecified osteoarthritis, unspecified site: Secondary | ICD-10-CM

## 2017-06-11 DIAGNOSIS — F419 Anxiety disorder, unspecified: Secondary | ICD-10-CM

## 2017-06-11 DIAGNOSIS — F329 Major depressive disorder, single episode, unspecified: Secondary | ICD-10-CM

## 2017-06-11 DIAGNOSIS — R8290 Unspecified abnormal findings in urine: Principal | ICD-10-CM

## 2017-06-11 DIAGNOSIS — I10 Essential (primary) hypertension: Secondary | ICD-10-CM

## 2017-06-14 DIAGNOSIS — N3941 Urge incontinence: Principal | ICD-10-CM

## 2017-06-16 MED ORDER — OXYBUTYNIN CHLORIDE 5 MG PO TABS
2 refills | Status: CP
Start: 2017-06-16 — End: 2017-09-17

## 2017-06-16 MED ORDER — NITROFURANTOIN MONOHYD MACRO 100 MG PO CAPS
100 mg | Freq: Two times a day (BID) | ORAL | 0 refills | Status: CP
Start: 2017-06-16 — End: 2017-06-27

## 2017-06-16 MED ORDER — DONEPEZIL HCL 5 MG PO TABS
0 refills | Status: CP
Start: 2017-06-16 — End: 2017-09-17

## 2017-06-21 DIAGNOSIS — K210 Gastro-esophageal reflux disease with esophagitis: Principal | ICD-10-CM

## 2017-06-23 MED ORDER — RANITIDINE HCL 150 MG PO TABS
0 refills | Status: CP
Start: 2017-06-23 — End: 2017-10-02

## 2017-06-27 ENCOUNTER — Inpatient Hospital Stay: Payer: Medicare Other | Attending: Internal Medicine

## 2017-06-27 ENCOUNTER — Encounter: Payer: Self-pay | Admitting: Internal Medicine

## 2017-06-27 ENCOUNTER — Inpatient Hospital Stay (HOSPITAL_BASED_OUTPATIENT_CLINIC_OR_DEPARTMENT_OTHER): Payer: Medicare Other | Admitting: Internal Medicine

## 2017-06-27 ENCOUNTER — Other Ambulatory Visit: Payer: Self-pay

## 2017-06-27 ENCOUNTER — Ambulatory Visit: Attending: Internal Medicine | Primary: Internal Medicine

## 2017-06-27 VITALS — BP 143/93 | HR 85 | Temp 97.2°F | Resp 16 | Ht 61.0 in | Wt 122.6 lb

## 2017-06-27 DIAGNOSIS — J069 Acute upper respiratory infection, unspecified: Secondary | ICD-10-CM

## 2017-06-27 DIAGNOSIS — Z79899 Other long term (current) drug therapy: Secondary | ICD-10-CM | POA: Diagnosis not present

## 2017-06-27 DIAGNOSIS — J45909 Unspecified asthma, uncomplicated: Secondary | ICD-10-CM

## 2017-06-27 DIAGNOSIS — D61818 Other pancytopenia: Secondary | ICD-10-CM | POA: Insufficient documentation

## 2017-06-27 DIAGNOSIS — Z7982 Long term (current) use of aspirin: Secondary | ICD-10-CM

## 2017-06-27 DIAGNOSIS — Z9221 Personal history of antineoplastic chemotherapy: Secondary | ICD-10-CM

## 2017-06-27 DIAGNOSIS — C8331 Diffuse large B-cell lymphoma, lymph nodes of head, face, and neck: Secondary | ICD-10-CM

## 2017-06-27 DIAGNOSIS — C8330 Diffuse large B-cell lymphoma, unspecified site: Secondary | ICD-10-CM

## 2017-06-27 DIAGNOSIS — Z95828 Presence of other vascular implants and grafts: Secondary | ICD-10-CM

## 2017-06-27 DIAGNOSIS — D6481 Anemia due to antineoplastic chemotherapy: Secondary | ICD-10-CM | POA: Diagnosis not present

## 2017-06-27 DIAGNOSIS — J9610 Chronic respiratory failure, unspecified whether with hypoxia or hypercapnia: Secondary | ICD-10-CM

## 2017-06-27 DIAGNOSIS — E876 Hypokalemia: Secondary | ICD-10-CM | POA: Diagnosis not present

## 2017-06-27 DIAGNOSIS — E1149 Type 2 diabetes mellitus with other diabetic neurological complication: Secondary | ICD-10-CM

## 2017-06-27 DIAGNOSIS — E1165 Type 2 diabetes mellitus with hyperglycemia: Secondary | ICD-10-CM

## 2017-06-27 DIAGNOSIS — E1142 Type 2 diabetes mellitus with diabetic polyneuropathy: Principal | ICD-10-CM

## 2017-06-27 DIAGNOSIS — Z6841 Body Mass Index (BMI) 40.0 and over, adult: Secondary | ICD-10-CM

## 2017-06-27 DIAGNOSIS — F329 Major depressive disorder, single episode, unspecified: Secondary | ICD-10-CM

## 2017-06-27 DIAGNOSIS — I10 Essential (primary) hypertension: Secondary | ICD-10-CM

## 2017-06-27 DIAGNOSIS — E785 Hyperlipidemia, unspecified: Secondary | ICD-10-CM

## 2017-06-27 DIAGNOSIS — M1990 Unspecified osteoarthritis, unspecified site: Secondary | ICD-10-CM

## 2017-06-27 DIAGNOSIS — M21961 Unspecified acquired deformity of right lower leg: Secondary | ICD-10-CM

## 2017-06-27 DIAGNOSIS — E1140 Type 2 diabetes mellitus with diabetic neuropathy, unspecified: Secondary | ICD-10-CM

## 2017-06-27 DIAGNOSIS — K219 Gastro-esophageal reflux disease without esophagitis: Secondary | ICD-10-CM

## 2017-06-27 DIAGNOSIS — M21962 Unspecified acquired deformity of left lower leg: Secondary | ICD-10-CM

## 2017-06-27 DIAGNOSIS — F419 Anxiety disorder, unspecified: Secondary | ICD-10-CM

## 2017-06-27 DIAGNOSIS — E119 Type 2 diabetes mellitus without complications: Principal | ICD-10-CM

## 2017-06-27 LAB — CBC WITH DIFFERENTIAL/PLATELET
Basophils Absolute: 0.1 K/uL (ref 0–0.1)
Basophils Relative: 1 %
Eosinophils Absolute: 0.3 K/uL (ref 0–0.7)
Eosinophils Relative: 5 %
HCT: 36.9 % (ref 35.0–47.0)
Hemoglobin: 12.3 g/dL (ref 12.0–16.0)
Lymphocytes Relative: 18 %
Lymphs Abs: 1.1 K/uL (ref 1.0–3.6)
MCH: 33.1 pg (ref 26.0–34.0)
MCHC: 33.4 g/dL (ref 32.0–36.0)
MCV: 99.2 fL (ref 80.0–100.0)
Monocytes Absolute: 0.6 K/uL (ref 0.2–0.9)
Monocytes Relative: 10 %
Neutro Abs: 4.1 K/uL (ref 1.4–6.5)
Neutrophils Relative %: 66 %
Platelets: 253 K/uL (ref 150–440)
RBC: 3.72 MIL/uL — ABNORMAL LOW (ref 3.80–5.20)
RDW: 16.1 % — ABNORMAL HIGH (ref 11.5–14.5)
WBC: 6.2 K/uL (ref 3.6–11.0)

## 2017-06-27 LAB — BASIC METABOLIC PANEL
Anion gap: 8 (ref 5–15)
BUN: 30 mg/dL — AB (ref 6–20)
CHLORIDE: 103 mmol/L (ref 101–111)
CO2: 29 mmol/L (ref 22–32)
Calcium: 9.4 mg/dL (ref 8.9–10.3)
Creatinine, Ser: 0.98 mg/dL (ref 0.44–1.00)
GFR, EST NON AFRICAN AMERICAN: 56 mL/min — AB (ref >60)
Glucose, Bld: 97 mg/dL (ref 65–99)
POTASSIUM: 3.2 mmol/L — AB (ref 3.5–5.1)
SODIUM: 140 mmol/L (ref 135–145)

## 2017-06-27 LAB — MAGNESIUM: MAGNESIUM: 2 mg/dL (ref 1.7–2.4)

## 2017-06-27 MED ORDER — SODIUM CHLORIDE 0.9% FLUSH
10.0000 mL | INTRAVENOUS | Status: AC | PRN
  Administered 2017-06-27: 10 mL
  Filled 2017-06-27: qty 10

## 2017-06-27 MED ORDER — HEPARIN SOD (PORK) LOCK FLUSH 100 UNIT/ML IV SOLN
500.0000 [IU] | INTRAVENOUS | Status: AC | PRN
  Administered 2017-06-27: 500 [IU]

## 2017-06-27 NOTE — Progress Notes
Needs diabetic shoes

## 2017-06-27 NOTE — Progress Notes (Signed)
Patient hospitlized 3 weeks ago for upper respiratory infection.

## 2017-06-27 NOTE — Assessment & Plan Note (Addendum)
#   DLBCL-status post relapse; status post autologous stem cell transplant.  last treatment in 2015; PET May 2016- NED. Clinically no evidence of recurrence.  # Transient/mild pancytopenia-Question viral. resolved; today CBC within normal limits.   # Chronic respiratory failure on oxygen; multiple lung infections; ? Bronchitis vs pneumonia. currently improved.  See discussion below   #Cough/ " sinus congestion"-appears URI.  Recommend Claritin for 7 days.  # Hypokalemia-  Potassium 3.2. Recent labs with PCP-normal; recommend dietary supp.   # Follow-up with me as planned in appx 6 months/labs port flush/ MD; port flush in 2 months.

## 2017-06-27 NOTE — Progress Notes (Signed)
Anna Mcbride OFFICE PROGRESS NOTE  Patient Care Team: Madelyn Brunner, MD as PCP - General (Unknown Physician Specialty) Madelyn Brunner, MD as Referring Physician (Unknown Physician Specialty) Forest Gleason, MD (Unknown Physician Specialty) Christene Lye, MD (General Surgery)  Cancer Staging No matching staging information was found for the patient.   Oncology History   1.  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  Status post 6 cycles of chemotherapy with CHOP9nd involved field radiation treatment 18,00 rads to the mantle field  and mediasinal boost 2340. finished radiation and chemotherapy in November of 2000 residual mediastinotomy mass 2 cm 2.  In May of 2001 progressive disease with mediastinal mass becoming bigger 8 cm3.  The patient underwent 2 cycles of chemotherapy withRituxan, and ICE.(June of 2001) Followed by 95more cycles  at [facility] with DHAP.and high-dose chemotherapy with  BEAM finished treatment in October of 2001. patient had significant pancytopenia as well as fever lasting for 3 months after transplant. Recurrent sinus infection and pulmonary infection 3.  Patient  underwent EUS biopsy of which was negative .  (November, 2012) 4.  Recurrent diffuse large cell lymphoma from the right side of the neck lymph node biopsy done in October of 2014;   Started on RICE october 2014 6.ppatient is now being considered for high-dose chemotherapy and stem cell support (March, 2015) 7.2nd stem cell transplant cannot be done because of the  no stem cell to harvest     Lymphoma, non-Hodgkin's ([place])   06/30/2013 Initial Diagnosis    Lymphoma, non-Hodgkin's       Diffuse large B-cell lymphoma of lymph nodes of neck ([place])    INTERVAL HISTORY:  Anna Mcbride 71 y.o.  female pleasant patient above history of Relapsed diffuse large B-cell lymphoma status post cell transplant; most currently in  remission; intermittent pancytopenia is here for follow-up.  Patient just returned from [place] visiting a family for Thanksgiving.  On the way back she had a fever low-grade currently resolved.  She is also been treated with "2 rounds of antibiotics and prednisone".   Patient complains of mild soreness in the throat ; also cough nonproductive.  No unusual shortness of breath.  No new lumps or bumps. No nausea or vomiting. No significant weight loss.  REVIEW OF SYSTEMS:  A complete 10 point review of system is done which is negative except mentioned above/history of present illness.   PAST MEDICAL HISTORY :  Past Medical History:  Diagnosis Date  . Acute respiratory failure ([place]) 11/29/2014  . 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 Asthma   . Non Hodgkin's lymphoma ([place]) [id number]  . Personal history of chemotherapy     PAST SURGICAL HISTORY :   Past Surgical History:  Procedure Laterality Date  . 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 NASAL SINUS SURGERY  08/07/05 09/09/05   x 2   . VIDEO BRONCHOSCOPY Bilateral 11/30/2014   Procedure: VIDEO BRONCHOSCOPY WITHOUT FLUORO;  Surgeon: Flora Lipps, MD;  Location: [facility] ORS;  Service: Cardiopulmonary;  Laterality: Bilateral;    FAMILY HISTORY :   Family History  Problem Relation Age of Onset  . Asthma Son   . Congestive Heart Failure Mother   . Diabetes Brother   . Pulmonary embolism Father   . Breast cancer Neg Hx     SOCIAL HISTORY:   Social History   Tobacco Use  . Smoking status: Never  Smoker  . Smokeless tobacco: Never Used  Substance Use Topics  . Alcohol use: Yes  . Drug use: No    ALLERGIES:  is allergic to hydrocodone-homatropine; tussionex pennkinetic er [hydrocod polst-cpm polst er]; and ciprofloxacin.  MEDICATIONS:  Current Outpatient Medications  Medication Sig Dispense Refill  . albuterol (PROVENTIL HFA;VENTOLIN HFA) 108 (90 BASE) MCG/ACT inhaler Inhale 1 puff into the lungs every 6 (six) hours as  needed for wheezing or shortness of breath.    [name] albuterol (PROVENTIL) (2.5 MG/3ML) 0.083% nebulizer solution Take 3 mLs (2.5 mg total) by nebulization every 4 (four) hours as needed for wheezing or shortness of breath. 75 mL 12  . ALPRAZolam (XANAX) 0.5 MG tablet TAKE 1 TABLET BY MOUTH AT BEDTIME AS NEEDED FOR SLEEP 30 tablet 1  . AMBULATORY NON FORMULARY MEDICATION Medication Name: Incentive Spirometer Use 10-15 times daily 1 each 0  . aspirin EC 81 MG tablet Take 81 mg by mouth daily.    . budesonide (PULMICORT) 0.5 MG/2ML nebulizer solution Take 2 mLs (0.5 mg total) by nebulization 2 (two) times daily. 120 mL 11  . citalopram (CELEXA) 20 MG tablet TAKE 1 TABLET BY MOUTH ONCE DAILY 90 tablet 1  . formoterol (PERFOROMIST) 20 MCG/2ML nebulizer solution Take 2 mLs (20 mcg total) by nebulization 2 (two) times daily. 120 mL 11  . levothyroxine (SYNTHROID, LEVOTHROID) 25 MCG tablet TAKE 1 TABLET BY MOUTH ONCE DAILY ON AN EMPTY STOMACH. WAIT 30 MINUTES BEFORE TAKING OTHER MEDS. 30 tablet 4  . omeprazole (PRILOSEC) 20 MG capsule TAKE 1 CAPSULE BY MOUTH TWICE DAILY BEFORE MEALS 180 capsule 3  . ondansetron (ZOFRAN ODT) 4 MG disintegrating tablet Take 1 tablet (4 mg total) every 8 (eight) hours as needed by mouth for nausea or vomiting. 20 tablet 0  . Respiratory Therapy Supplies (FLUTTER) DEVI Use 10-15 times daily 1 each 0  . vitamin C (ASCORBIC ACID) 500 MG tablet Take 500 mg by mouth daily.     No current facility-administered medications for this visit.     PHYSICAL EXAMINATION: ECOG PERFORMANCE STATUS: 1 - Symptomatic but completely ambulatory  BP (!) 143/93 (BP Location: Right Arm, Patient Position: Sitting)   Pulse 85   Temp (!) 97.2 F (36.2 C) (Tympanic)   Resp 16   Ht 5\' 1"  (1.549 m)   Wt 122 lb 9.6 oz (55.6 kg)   LMP  (LMP Unknown)   SpO2 100%   BMI 23.17 kg/m   Filed Weights   06/27/17 1413  Weight: 122 lb 9.6 oz (55.6 kg)    GENERAL: Thin built moderately nourished;   Alert, no distress and comfortable.   Accompanied by her husband.  Wearing oxygen. EYES: no pallor or icterus OROPHARYNX: no thrush or ulceration; good dentition  NECK: supple, no masses felt LYMPH:  no palpable lymphadenopathy in the cervical, axillary or inguinal regions LUNGS: Bilateral coarse breath sounds to auscultation and  No wheeze or crackles HEART/CVS: regular rate & rhythm and no murmurs; No lower extremity edema ABDOMEN:abdomen soft, non-tender and normal bowel sounds Musculoskeletal:no cyanosis of digits and no clubbing  PSYCH: alert & oriented x 3 with fluent speech NEURO: no focal motor/sensory deficits SKIN:  no rashes or significant lesions  LABORATORY DATA:  I have reviewed the data as listed    Component Value Date/Time   NA 140 06/27/2017 1320   NA 140 11/16/2014 1415   K 3.2 (L) 06/27/2017 1320   K 3.3 (L) 11/16/2014 1415   CL 103  06/27/2017 1320   CL 107 11/16/2014 1415   CO2 29 06/27/2017 1320   CO2 29 11/16/2014 1415   GLUCOSE 97 06/27/2017 1320   GLUCOSE 117 (H) 11/16/2014 1415   BUN 30 (H) 06/27/2017 1320   BUN 31 (H) 11/16/2014 1415   CREATININE 0.98 06/27/2017 1320   CREATININE 1.19 (H) 11/16/2014 1415   CALCIUM 9.4 06/27/2017 1320   CALCIUM 9.1 11/16/2014 1415   PROT 6.7 02/21/2017 1315   PROT 6.8 11/16/2014 1415   ALBUMIN 4.0 02/21/2017 1315   ALBUMIN 3.8 11/16/2014 1415   AST 18 02/21/2017 1315   AST 17 11/16/2014 1415   ALT 13 (L) 02/21/2017 1315   ALT 11 (L) 11/16/2014 1415   ALKPHOS 53 02/21/2017 1315   ALKPHOS 115 11/16/2014 1415   BILITOT 0.7 02/21/2017 1315   BILITOT 0.4 11/16/2014 1415   GFRNONAA 56 (L) 06/27/2017 1320   GFRNONAA 46 (L) 11/16/2014 1415   GFRAA >60 06/27/2017 1320   GFRAA 54 (L) 11/16/2014 1415    No results found for: SPEP, UPEP  Lab Results  Component Value Date   WBC 6.2 06/27/2017   NEUTROABS 4.1 06/27/2017   HGB 12.3 06/27/2017   HCT 36.9 06/27/2017   MCV 99.2 06/27/2017   PLT 253 06/27/2017       Chemistry      Component Value Date/Time   NA 140 06/27/2017 1320   NA 140 11/16/2014 1415   K 3.2 (L) 06/27/2017 1320   K 3.3 (L) 11/16/2014 1415   CL 103 06/27/2017 1320   CL 107 11/16/2014 1415   CO2 29 06/27/2017 1320   CO2 29 11/16/2014 1415   BUN 30 (H) 06/27/2017 1320   BUN 31 (H) 11/16/2014 1415   CREATININE 0.98 06/27/2017 1320   CREATININE 1.19 (H) 11/16/2014 1415      Component Value Date/Time   CALCIUM 9.4 06/27/2017 1320   CALCIUM 9.1 11/16/2014 1415   ALKPHOS 53 02/21/2017 1315   ALKPHOS 115 11/16/2014 1415   AST 18 02/21/2017 1315   AST 17 11/16/2014 1415   ALT 13 (L) 02/21/2017 1315   ALT 11 (L) 11/16/2014 1415   BILITOT 0.7 02/21/2017 1315   BILITOT 0.4 11/16/2014 1415       RADIOGRAPHIC STUDIES: I have personally reviewed the radiological images as listed and agreed with the findings in the report. No results found.   ASSESSMENT & PLAN:  Diffuse large B-cell lymphoma of lymph nodes of neck ([place]) # DLBCL-status post relapse; status post autologous stem cell transplant.  last treatment in 2015; PET May 2016- NED. Clinically no evidence of recurrence.  # Transient/mild pancytopenia-Question viral. resolved; today CBC within normal limits.   # Chronic respiratory failure on oxygen; multiple lung infections; ? Bronchitis vs pneumonia. currently improved.  See discussion below   #Cough/ " sinus congestion"-appears URI.  Recommend Claritin for 7 days.  # Hypokalemia-  Potassium 3.2. Recent labs with PCP-normal; recommend dietary supp.   # Follow-up with me as planned in appx 6 months/labs port flush/ MD; port flush in 2 months.    Orders Placed This Encounter  Procedures  . CBC with Differential    Standing Status:   Future    Standing Expiration Date:   06/27/2018  . Comprehensive metabolic panel    Standing Status:   Future    Standing Expiration Date:   06/27/2018  . Magnesium    Standing Status:   Future    Standing Expiration Date:  06/27/2018   All questions were answered. The patient knows to call the clinic with any problems, questions or concerns.      Cammie Sickle, MD 06/27/2017 2:43 PM

## 2017-07-06 NOTE — Progress Notes
Reason:  Physician ordered labs  Amount:  1 marble, 1 lav, urine Tubes  Type:  Butterfly  Site:  right Hand  Reaction:  None    Draw performed by:  [id number],  06/27/2017

## 2017-07-07 DIAGNOSIS — M791 Myalgia, unspecified site: Principal | ICD-10-CM

## 2017-07-07 MED ORDER — TIZANIDINE HCL 2 MG PO TABS
0 refills | Status: CP
Start: 2017-07-07 — End: 2017-07-24

## 2017-07-23 DIAGNOSIS — M791 Myalgia, unspecified site: Principal | ICD-10-CM

## 2017-07-23 DIAGNOSIS — K210 Gastro-esophageal reflux disease with esophagitis: Secondary | ICD-10-CM

## 2017-07-24 MED ORDER — ESOMEPRAZOLE MAGNESIUM 40 MG PO CPDR
1 refills | Status: CP
Start: 2017-07-24 — End: 2018-01-29

## 2017-07-24 MED ORDER — TIZANIDINE HCL 2 MG PO TABS
0 refills | Status: CP
Start: 2017-07-24 — End: 2017-08-15

## 2017-07-29 ENCOUNTER — Ambulatory Visit: Payer: Medicare Other | Admitting: Internal Medicine

## 2017-07-29 ENCOUNTER — Encounter: Payer: Self-pay | Admitting: Internal Medicine

## 2017-07-29 VITALS — BP 106/62 | HR 98 | Ht 61.0 in | Wt 125.0 lb

## 2017-07-29 DIAGNOSIS — J9611 Chronic respiratory failure with hypoxia: Secondary | ICD-10-CM
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
## 2017-07-29 NOTE — Patient Instructions (Signed)
Continue meds as prescribed.

## 2017-07-31 DIAGNOSIS — I10 Essential (primary) hypertension: Secondary | ICD-10-CM

## 2017-07-31 DIAGNOSIS — J449 Chronic obstructive pulmonary disease, unspecified: Secondary | ICD-10-CM

## 2017-07-31 DIAGNOSIS — E785 Hyperlipidemia, unspecified: Secondary | ICD-10-CM

## 2017-07-31 DIAGNOSIS — J3089 Other allergic rhinitis: Secondary | ICD-10-CM

## 2017-07-31 MED ORDER — HUMALOG MIX 75/25 (75-25) 100 UNIT/ML SC SUSP
5 refills | Status: CP
Start: 2017-07-31 — End: 2017-10-02

## 2017-07-31 MED ORDER — PROAIR HFA 108 (90 BASE) MCG/ACT IN AERS
1 refills | Status: CP
Start: 2017-07-31 — End: 2018-01-29

## 2017-07-31 MED ORDER — FLUTICASONE PROPIONATE 50 MCG/ACT NA SUSP
1 refills | Status: CP
Start: 2017-07-31 — End: 2018-01-29

## 2017-07-31 MED ORDER — LISINOPRIL 10 MG PO TABS
0 refills | Status: CP
Start: 2017-07-31 — End: 2017-10-02

## 2017-07-31 MED ORDER — PRAVASTATIN SODIUM 40 MG PO TABS
0 refills | Status: CP
Start: 2017-07-31 — End: 2017-11-05

## 2017-08-14 DIAGNOSIS — E781 Pure hyperglyceridemia: Principal | ICD-10-CM

## 2017-08-14 DIAGNOSIS — M791 Myalgia, unspecified site: Secondary | ICD-10-CM

## 2017-08-15 MED ORDER — FENOFIBRATE 145 MG PO TABS
0 refills | Status: CP
Start: 2017-08-15 — End: 2017-10-02

## 2017-08-15 MED ORDER — TIZANIDINE HCL 2 MG PO TABS
0 refills | Status: CP
Start: 2017-08-15 — End: 2017-09-12

## 2017-08-28 DIAGNOSIS — R140 Abdominal distension (gaseous): Principal | ICD-10-CM

## 2017-08-28 DIAGNOSIS — R42 Dizziness and giddiness: Principal | ICD-10-CM

## 2017-08-29 MED ORDER — DICYCLOMINE HCL 10 MG PO CAPS
0 refills | Status: CP
Start: 2017-08-29 — End: 2017-10-02

## 2017-08-29 MED ORDER — MECLIZINE HCL 25 MG PO TABS
2 refills | Status: CP
Start: 2017-08-29 — End: 2018-01-29

## 2017-09-01 ENCOUNTER — Other Ambulatory Visit: Payer: Self-pay | Admitting: Internal Medicine

## 2017-09-02 ENCOUNTER — Inpatient Hospital Stay: Payer: Medicare Other | Attending: Internal Medicine

## 2017-09-02 DIAGNOSIS — C8331 Diffuse large B-cell lymphoma, lymph nodes of head, face, and neck: Secondary | ICD-10-CM | POA: Diagnosis not present

## 2017-09-02 DIAGNOSIS — Z95828 Presence of other vascular implants and grafts: Secondary | ICD-10-CM

## 2017-09-02 DIAGNOSIS — Z452 Encounter for adjustment and management of vascular access device: Secondary | ICD-10-CM | POA: Diagnosis present

## 2017-09-02 MED ORDER — SODIUM CHLORIDE 0.9% FLUSH
10.0000 mL | INTRAVENOUS | Status: AC | PRN
  Administered 2017-09-02: 10 mL
  Filled 2017-09-02: qty 10

## 2017-09-02 MED ORDER — HEPARIN SOD (PORK) LOCK FLUSH 100 UNIT/ML IV SOLN
500.0000 [IU] | INTRAVENOUS | Status: AC | PRN
  Administered 2017-09-02: 500 [IU]

## 2017-09-10 DIAGNOSIS — M791 Myalgia, unspecified site: Principal | ICD-10-CM

## 2017-09-11 MED ORDER — TIZANIDINE HCL 2 MG PO TABS
0 refills | Status: CP
Start: 2017-09-11 — End: 2017-10-02

## 2017-09-16 ENCOUNTER — Ambulatory Visit: Attending: Internal Medicine | Primary: Internal Medicine

## 2017-09-16 DIAGNOSIS — F329 Major depressive disorder, single episode, unspecified: Secondary | ICD-10-CM

## 2017-09-16 DIAGNOSIS — Z6841 Body Mass Index (BMI) 40.0 and over, adult: Principal | ICD-10-CM

## 2017-09-16 DIAGNOSIS — E119 Type 2 diabetes mellitus without complications: Principal | ICD-10-CM

## 2017-09-16 DIAGNOSIS — Z1231 Encounter for screening mammogram for malignant neoplasm of breast: Secondary | ICD-10-CM

## 2017-09-16 DIAGNOSIS — E1142 Type 2 diabetes mellitus with diabetic polyneuropathy: Secondary | ICD-10-CM

## 2017-09-16 DIAGNOSIS — E1140 Type 2 diabetes mellitus with diabetic neuropathy, unspecified: Secondary | ICD-10-CM

## 2017-09-16 DIAGNOSIS — I10 Essential (primary) hypertension: Secondary | ICD-10-CM

## 2017-09-16 DIAGNOSIS — W19XXXS Unspecified fall, sequela: Secondary | ICD-10-CM

## 2017-09-16 DIAGNOSIS — E7849 Other hyperlipidemia: Secondary | ICD-10-CM

## 2017-09-16 DIAGNOSIS — E785 Hyperlipidemia, unspecified: Secondary | ICD-10-CM

## 2017-09-16 DIAGNOSIS — R911 Solitary pulmonary nodule: Secondary | ICD-10-CM

## 2017-09-16 DIAGNOSIS — Z794 Long term (current) use of insulin: Secondary | ICD-10-CM

## 2017-09-16 DIAGNOSIS — F419 Anxiety disorder, unspecified: Secondary | ICD-10-CM

## 2017-09-16 DIAGNOSIS — M1990 Unspecified osteoarthritis, unspecified site: Secondary | ICD-10-CM

## 2017-09-16 DIAGNOSIS — E1149 Type 2 diabetes mellitus with other diabetic neurological complication: Secondary | ICD-10-CM

## 2017-09-16 DIAGNOSIS — K219 Gastro-esophageal reflux disease without esophagitis: Secondary | ICD-10-CM

## 2017-09-16 MED ORDER — GABAPENTIN 300 MG PO CAPS
0 refills
Start: 2017-09-16 — End: 2017-09-17

## 2017-09-16 MED ORDER — GABAPENTIN 300 MG PO CAPS
3 refills
Start: 2017-09-16 — End: 2018-04-14

## 2017-09-16 NOTE — Progress Notes
She was recently admitted to hospital sp fall, found to be dehydrated stopped diurtic now, monitor wt, she is due for lab before next visit, BS is still flactuating

## 2017-09-17 MED ORDER — OXYBUTYNIN CHLORIDE 5 MG PO TABS
2 refills
Start: 2017-09-17 — End: 2018-04-14

## 2017-09-17 MED ORDER — HYDROCHLOROTHIAZIDE 25 MG PO TABS
0 refills
Start: 2017-09-17 — End: ?

## 2017-09-17 MED ORDER — TORSEMIDE 20 MG PO TABS
0 refills
Start: 2017-09-17 — End: ?

## 2017-09-18 ENCOUNTER — Encounter: Attending: Internal Medicine | Primary: Internal Medicine

## 2017-10-01 DIAGNOSIS — N3941 Urge incontinence: Secondary | ICD-10-CM

## 2017-10-01 DIAGNOSIS — M791 Myalgia, unspecified site: Secondary | ICD-10-CM

## 2017-10-01 DIAGNOSIS — R140 Abdominal distension (gaseous): Secondary | ICD-10-CM

## 2017-10-01 DIAGNOSIS — I10 Essential (primary) hypertension: Secondary | ICD-10-CM

## 2017-10-01 DIAGNOSIS — E781 Pure hyperglyceridemia: Secondary | ICD-10-CM

## 2017-10-01 DIAGNOSIS — K210 Gastro-esophageal reflux disease with esophagitis: Principal | ICD-10-CM

## 2017-10-02 ENCOUNTER — Telehealth: Payer: Self-pay | Admitting: *Deleted

## 2017-10-02 DIAGNOSIS — R509 Fever, unspecified: Secondary | ICD-10-CM

## 2017-10-02 MED ORDER — LISINOPRIL 10 MG PO TABS
0 refills | Status: CP
Start: 2017-10-02 — End: 2018-03-03

## 2017-10-02 MED ORDER — FENOFIBRATE 145 MG PO TABS
0 refills | Status: CP
Start: 2017-10-02 — End: ?

## 2017-10-02 MED ORDER — HUMALOG MIX 75/25 (75-25) 100 UNIT/ML SC SUSP
5 refills | Status: CP
Start: 2017-10-02 — End: ?

## 2017-10-02 MED ORDER — DICYCLOMINE HCL 10 MG PO CAPS
0 refills | Status: CP
Start: 2017-10-02 — End: ?

## 2017-10-02 MED ORDER — RANITIDINE HCL 150 MG PO TABS
3 refills | Status: CP
Start: 2017-10-02 — End: ?

## 2017-10-02 MED ORDER — TIZANIDINE HCL 2 MG PO TABS
3 refills | Status: CP
Start: 2017-10-02 — End: 2018-04-14

## 2017-10-02 MED ORDER — OXYBUTYNIN CHLORIDE 5 MG PO TABS
2 refills | Status: CP
Start: 2017-10-02 — End: ?

## 2017-10-02 NOTE — Telephone Encounter (Signed)
Nelson called back and accepted appointment for 830 in am

## 2017-10-02 NOTE — Telephone Encounter (Signed)
Nelson called and left message that patient is running low grade fever and is dehydrated. Asking for her to be seen possibly today or tomorrow.   Per Lorretta Harp, NP, see in morning, CBC, CMP, Mg+ IV fluids  I attempted to return call and got voice mail and left message to return my call.

## 2017-10-03 ENCOUNTER — Inpatient Hospital Stay (HOSPITAL_BASED_OUTPATIENT_CLINIC_OR_DEPARTMENT_OTHER): Payer: Medicare Other | Admitting: Nurse Practitioner

## 2017-10-03 ENCOUNTER — Encounter: Payer: Self-pay | Admitting: Nurse Practitioner

## 2017-10-03 ENCOUNTER — Inpatient Hospital Stay: Payer: Medicare Other

## 2017-10-03 ENCOUNTER — Inpatient Hospital Stay: Payer: Medicare Other | Attending: Oncology

## 2017-10-03 ENCOUNTER — Other Ambulatory Visit: Payer: Self-pay

## 2017-10-03 VITALS — BP 109/69 | HR 93 | Temp 98.0°F | Resp 22 | Ht 61.0 in | Wt 122.0 lb

## 2017-10-03 DIAGNOSIS — E860 Dehydration: Secondary | ICD-10-CM | POA: Diagnosis not present

## 2017-10-03 DIAGNOSIS — C8330 Diffuse large B-cell lymphoma, unspecified site: Secondary | ICD-10-CM

## 2017-10-03 DIAGNOSIS — R509 Fever, unspecified: Secondary | ICD-10-CM

## 2017-10-03 DIAGNOSIS — C8331 Diffuse large B-cell lymphoma, lymph nodes of head, face, and neck: Secondary | ICD-10-CM | POA: Diagnosis not present

## 2017-10-03 DIAGNOSIS — R5381 Other malaise: Secondary | ICD-10-CM | POA: Diagnosis not present

## 2017-10-03 DIAGNOSIS — Z95828 Presence of other vascular implants and grafts: Secondary | ICD-10-CM

## 2017-10-03 DIAGNOSIS — R110 Nausea: Secondary | ICD-10-CM

## 2017-10-03 LAB — CBC WITH DIFFERENTIAL/PLATELET
BASOS ABS: 0 10*3/uL (ref 0–0.1)
Basophils Relative: 1 %
Eosinophils Absolute: 0.1 10*3/uL (ref 0–0.7)
Eosinophils Relative: 3 %
HCT: 39.7 % (ref 35.0–47.0)
HEMOGLOBIN: 13.7 g/dL (ref 12.0–16.0)
LYMPHS ABS: 0.7 10*3/uL — AB (ref 1.0–3.6)
LYMPHS PCT: 16 %
MCH: 34.4 pg — ABNORMAL HIGH (ref 26.0–34.0)
MCHC: 34.4 g/dL (ref 32.0–36.0)
MCV: 99.9 fL (ref 80.0–100.0)
Monocytes Absolute: 0.7 10*3/uL (ref 0.2–0.9)
Monocytes Relative: 14 %
NEUTROS ABS: 3.1 10*3/uL (ref 1.4–6.5)
NEUTROS PCT: 66 %
PLATELETS: 126 10*3/uL — AB (ref 150–440)
RBC: 3.97 MIL/uL (ref 3.80–5.20)
RDW: 13.3 % (ref 11.5–14.5)
WBC: 4.6 10*3/uL (ref 3.6–11.0)

## 2017-10-03 LAB — COMPREHENSIVE METABOLIC PANEL
ALT: 14 U/L (ref 14–54)
ANION GAP: 8 (ref 5–15)
AST: 19 U/L (ref 15–41)
Albumin: 3.7 g/dL (ref 3.5–5.0)
Alkaline Phosphatase: 48 U/L (ref 38–126)
BILIRUBIN TOTAL: 1 mg/dL (ref 0.3–1.2)
BUN: 24 mg/dL — ABNORMAL HIGH (ref 6–20)
CHLORIDE: 103 mmol/L (ref 101–111)
CO2: 24 mmol/L (ref 22–32)
Calcium: 9.2 mg/dL (ref 8.9–10.3)
Creatinine, Ser: 1.2 mg/dL — ABNORMAL HIGH (ref 0.44–1.00)
GFR calc Af Amer: 51 mL/min — ABNORMAL LOW (ref >60)
GFR, EST NON AFRICAN AMERICAN: 44 mL/min — AB (ref >60)
Glucose, Bld: 110 mg/dL — ABNORMAL HIGH (ref 65–99)
POTASSIUM: 3.4 mmol/L — AB (ref 3.5–5.1)
Sodium: 135 mmol/L (ref 135–145)
TOTAL PROTEIN: 6.8 g/dL (ref 6.5–8.1)

## 2017-10-03 LAB — MAGNESIUM: Magnesium: 1.9 mg/dL (ref 1.7–2.4)

## 2017-10-03 LAB — TSH: TSH: 2.51 u[IU]/mL (ref 0.350–4.500)

## 2017-10-03 MED ORDER — SODIUM CHLORIDE 0.9% FLUSH
10.0000 mL | INTRAVENOUS | Status: DC | PRN
  Filled 2017-10-03: qty 10

## 2017-10-03 MED ORDER — SODIUM CHLORIDE 0.9 % IV SOLN
INTRAVENOUS | Status: DC
  Administered 2017-10-03: 10:00:00 via INTRAVENOUS
  Filled 2017-10-03 (×2): qty 1000

## 2017-10-03 MED ORDER — HEPARIN SOD (PORK) LOCK FLUSH 100 UNIT/ML IV SOLN
INTRAVENOUS | Status: AC
  Filled 2017-10-03: qty 5

## 2017-10-03 MED ORDER — HEPARIN SOD (PORK) LOCK FLUSH 100 UNIT/ML IV SOLN
500.0000 [IU] | Freq: Once | INTRAVENOUS | Status: DC
  Administered 2017-10-03: 500 [IU] via INTRAVENOUS

## 2017-10-03 MED ORDER — ONDANSETRON HCL 4 MG/2ML IJ SOLN
8.0000 mg | Freq: Once | INTRAMUSCULAR | Status: AC
  Administered 2017-10-03: 8 mg via INTRAVENOUS
  Filled 2017-10-03: qty 4

## 2017-10-03 MED ORDER — SODIUM CHLORIDE 0.9% FLUSH
10.0000 mL | Freq: Once | INTRAVENOUS | Status: AC
  Administered 2017-10-03: 10 mL via INTRAVENOUS
  Filled 2017-10-03: qty 10

## 2017-10-03 NOTE — Progress Notes (Signed)
Symptom Management Consult note [facility]  [phone] [phone]  Patient Care Team: Baxter Hire, MD as PCP - General (Internal Medicine) Madelyn Brunner, MD as Referring Physician (Unknown Physician Specialty) Forest Gleason, MD (Unknown Physician Specialty) Christene Lye, MD (General Surgery)   Name of the patient: Anna Mcbride  [id number]  17-Oct-1945   Date of visit: 10/03/17  Diagnosis-diffuse large B-cell lymphoma  Chief complaint/ Reason for visit- Dehydration, anorexia, nausea  Heme/Onc history: Patient last evaluated by primary oncologist, Dr. Rogue Bussing, on 06/27/17.  Patient has history of diffuse large B-cell lymphoma status post relapse;    Previously, patient has diagnosis of diffuse large B-cell lymphoma CD20 positive stage IIB with pleural effusion- 11/1998, s/p   6 cycles of chemotherapy with CHOP9nd involved field radiation treatment 18,00 rads to the mantle field and mediasinal boost 2340.  She completed radiation and chemotherapy in November 2000.  Residual mediastinotomy mass 2 cm. In May of 2001 progressive disease with mediastinal mass becoming bigger 8 cm3. The patient underwent 2 cycles of chemotherapy withRituxan, and ICE.(June of 2001) followed by 74more cycles at [facility] with DHAP.and high-dose chemotherapy with BEAM finished treatment in October of 2001. Patient had significant pancytopenia as well as fever lasting for 3 months after transplant as well as recurrent sinus and pulmonary infections.  Patient underwent EUS biopsy of which was negative . (November, 2012) Biopsy of right neck lymph node 04/2013, revealed recurrent diffuse large B-cell lymphoma/Non-Hodgkin's Lymphoma. Started on RICE october 2014.  March 2015 -patient considered for high-dose chemotherapy and stem cell support.  Second stem cell transplant cannot be done because of no stem cells to harvest.  PET May 2016- NED. Most  currently in remission.  Intermittent pancytopenia.  Comorbidities complicating care: Chronic respiratory failure on home oxygen, multiple lung infections (bronchitis vs pneumonia), hypokalemia  Interval history-patient presents to symptom management clinic for concerns of dehydration and malaise.  Symptoms began 2 days ago.  At that time she was complaining of "shivering" and "feeling cold".  She had one episode of vomiting Wednesday night after a large meal.  Describes emesis as moderate in volume and is all liquid.  She continues to shiver and feel cold throughout Thursday with increasing fatigue and malaise.  Says she has not been eating or drinking.  Describes low-grade fever (Tmax 99.8).  Doesn't feel that symptoms are worsening but is concerned that symptoms are ongoing and not improving.  States she has not felt this way in the past.  Bowel movements have been normal in quantity and frequency.  Says she has been staying in bed for past 24 hours. Continues to feel nauseous. Has not taken anything for symptoms. Has not taken anti-pyretics.   ECOG FS:1 - Symptomatic but completely ambulatory  Review of systems- Review of Systems  Constitutional: Positive for chills, fever and malaise/fatigue.  HENT: Negative.   Eyes: Negative.   Respiratory: Negative.        On oxygen chronically  Cardiovascular: Negative.   Gastrointestinal: Positive for nausea and vomiting. Negative for abdominal pain, blood in stool, constipation, diarrhea, heartburn and melena.  Genitourinary: Negative.   Musculoskeletal: Negative.   Skin: Negative.   Neurological: Positive for weakness. Negative for dizziness, loss of consciousness and headaches.  Endo/Heme/Allergies: Negative.   Psychiatric/Behavioral: Negative.     Current treatment- no on active treatment  Allergies  Allergen Reactions  . Hydrocodone-Homatropine Itching  . Tussionex Pennkinetic Er [Hydrocod Polst-Cpm Polst Er] Itching  .  Ciprofloxacin Rash      Past Medical History:  Diagnosis Date  . Acute respiratory failure ([place]) 11/29/2014  . 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 Asthma   . Non Hodgkin's lymphoma ([place]) [id number]  . Personal history of chemotherapy     Past Surgical History:  Procedure Laterality Date  . 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 NASAL SINUS SURGERY  08/07/05 09/09/05   x 2   . VIDEO BRONCHOSCOPY Bilateral 11/30/2014   Procedure: VIDEO BRONCHOSCOPY WITHOUT FLUORO;  Surgeon: Flora Lipps, MD;  Location: [facility] ORS;  Service: Cardiopulmonary;  Laterality: Bilateral;    Social History   Socioeconomic History  . Marital status: Married    Spouse name: Not on file  . Number of children: 2  . Years of education: Not on file  . Highest education level: Not on file  Social Needs  . Financial resource strain: Not on file  . Food insecurity - worry: Not on file  . Food insecurity - inability: Not on file  . Transportation needs - medical: Not on file  . Transportation needs - non-medical: Not on file  Occupational History  . Occupation: Retired    Comment: Pharmacist, hospital  Tobacco Use  . Smoking status: Never Smoker  . Smokeless tobacco: Never Used  Substance and Sexual Activity  . Alcohol use: Yes  . Drug use: No  . Sexual activity: Not on file  Other Topics Concern  . Not on file  Social History Narrative  . Not on file    Family History  Problem Relation Age of Onset  . Asthma Son   . Congestive Heart Failure Mother   . Diabetes Brother   . Pulmonary embolism Father   . Breast cancer Neg Hx     Current Outpatient Medications:  .  albuterol (PROVENTIL) (2.5 MG/3ML) 0.083% nebulizer solution, Take 3 mLs (2.5 mg total) by nebulization every 4 (four) hours as needed for wheezing or shortness of breath., Disp: 75 mL, Rfl: 12 .  ALPRAZolam (XANAX) 0.5 MG tablet, TAKE 1 TABLET BY MOUTH AT BEDTIME AS NEEDED FOR SLEEP, Disp: 30 tablet, Rfl: 1 .  AMBULATORY NON FORMULARY MEDICATION, Medication Name:  Incentive Spirometer Use 10-15 times daily, Disp: 1 each, Rfl: 0 .  aspirin EC 81 MG tablet, Take 81 mg by mouth daily., Disp: , Rfl:  .  budesonide (PULMICORT) 0.5 MG/2ML nebulizer solution, Take 2 mLs (0.5 mg total) by nebulization 2 (two) times daily., Disp: 120 mL, Rfl: 11 .  citalopram (CELEXA) 20 MG tablet, TAKE 1 TABLET BY MOUTH ONCE DAILY, Disp: 90 tablet, Rfl: 1 .  formoterol (PERFOROMIST) 20 MCG/2ML nebulizer solution, Take 2 mLs (20 mcg total) by nebulization 2 (two) times daily., Disp: 120 mL, Rfl: 11 .  levothyroxine (SYNTHROID, LEVOTHROID) 25 MCG tablet, TAKE 1 TABLET BY MOUTH ONCE DAILY ON AN EMPTY STOMACH. WAIT 30 MINUTES BEFORE TAKING OTHER MEDS., Disp: 30 tablet, Rfl: 4 .  omeprazole (PRILOSEC) 20 MG capsule, TAKE 1 CAPSULE BY MOUTH TWICE DAILY BEFORE MEALS, Disp: 180 capsule, Rfl: 3 .  potassium chloride SA (K-DUR,KLOR-CON) 20 MEQ tablet, TAKE 1 TABLET BY MOUTH TWICE DAILY, Disp: 60 tablet, Rfl: 0 .  Respiratory Therapy Supplies (FLUTTER) DEVI, Use 10-15 times daily, Disp: 1 each, Rfl: 0 .  vitamin C (ASCORBIC ACID) 500 MG tablet, Take 500 mg by mouth daily., Disp: , Rfl:  .  albuterol (PROVENTIL HFA;VENTOLIN HFA) 108 (90 BASE) MCG/ACT inhaler, Inhale 1 puff into the lungs every 6 (six)  hours as needed for wheezing or shortness of breath., Disp: , Rfl:  .  ondansetron (ZOFRAN ODT) 4 MG disintegrating tablet, Take 1 tablet (4 mg total) every 8 (eight) hours as needed by mouth for nausea or vomiting. (Patient not taking: Reported on 10/03/2017), Disp: 20 tablet, Rfl: 0 No current facility-administered medications for this visit.   Facility-Administered Medications Ordered in Other Visits:  .  0.9 %  sodium chloride infusion, , Intravenous, Continuous, Verlon Au, NP, Stopped at 10/03/17 1101  Physical exam:  Vitals:   10/03/17 0900 10/03/17 0926  BP: 109/69   Pulse: 93   Resp: (!) 22   Temp: 98 F (36.7 C)   TempSrc: Tympanic   SpO2: 100%   Weight:  122 lb (55.3 kg)    Height:  5\' 1"  (1.549 m)   GENERAL: Thin built moderately nourished. NAD, Wearing oxygen. Accompanied by husband.  EYES: no pallor or icterus OROPHARYNX: no thrush or ulceration; good dentition  NECK: supple, no masses felt LYMPH:  no palpable cervical lymphadenopathy LUNGS: No wheezes or crackles. Clear to auscultation.  HEART/CVS: regular rate & rhythm and no murmurs; No lower extremity edema. 2+ DP pulse ABDOMEN:abdomen soft, non-tender and normal bowel sounds Musculoskeletal: no cyanosis of digits and no clubbing  PSYCH: alert & oriented x 3 with fluent speech NEURO: no focal motor/sensory deficits SKIN: dry & warm. Pale skin tone.   CMP Latest Ref Rng & Units 10/03/2017  Glucose 65 - 99 mg/dL 110(H)  BUN 6 - 20 mg/dL 24(H)  Creatinine 0.44 - 1.00 mg/dL 1.20(H)  Sodium 135 - 145 mmol/L 135  Potassium 3.5 - 5.1 mmol/L 3.4(L)  Chloride 101 - 111 mmol/L 103  CO2 22 - 32 mmol/L 24  Calcium 8.9 - 10.3 mg/dL 9.2  Total Protein 6.5 - 8.1 g/dL 6.8  Total Bilirubin 0.3 - 1.2 mg/dL 1.0  Alkaline Phos 38 - 126 U/L 48  AST 15 - 41 U/L 19  ALT 14 - 54 U/L 14   CBC Latest Ref Rng & Units 10/03/2017  WBC 3.6 - 11.0 K/uL 4.6  Hemoglobin 12.0 - 16.0 g/dL 13.7  Hematocrit 35.0 - 47.0 % 39.7  Platelets 150 - 440 K/uL 126(L)    Assessment and plan- Patient is a 72 y.o. female who presents to Symptom Management Clinic for complaints of malaise and dehydration.   1. Diffuse large B-cell lymphoma - s/p re stem cell transplantlapse; s/p autologous, last treatment in 2015.  PET May 2016- NED.  Clinically, no evidence of recurrence.  2.  Malaise and dehydration-unclear etiology. Question if initial viral illness with episode of emesis resulted in dehydration? Afebrile. Cr 1.20, BUN 24.  Previously, TSH 4.515 (09/11/16), FT4 1.04; on levothyroxine 25 mcg daily. 1 L normal saline and Zofran 8 mg IV given in clinic.  Patient tolerating fluids well in clinic.  Has Zofran at home. Ok to discharge. TSH  pending at this time. Discussed that if symptoms worsen or persist to follow up with her PCP.     Visit Diagnosis 1. Diffuse large B-cell lymphoma of lymph nodes of neck (HCC)   2. Dehydration    A total of (30) minutes of face-to-face time was spent with this patient with greater than 50% of that time in counseling and care-coordination.  Patient expressed understanding and was in agreement with this plan. She also understands that She can call clinic at any time with any questions, concerns, or complaints.    Beckey Rutter, DNP, AGNP-C [facility] [place] [phone] (office) 10/03/17 2:26 PM

## 2017-10-03 NOTE — Progress Notes (Signed)
Patient reports temp of 99.8 yesterday. Reports Nausea without vomiting. Has not eating anything since Wednesday night other than a graham cracker.

## 2017-10-14 ENCOUNTER — Ambulatory Visit: Attending: Internal Medicine | Primary: Internal Medicine

## 2017-10-14 DIAGNOSIS — R918 Other nonspecific abnormal finding of lung field: Secondary | ICD-10-CM

## 2017-10-14 DIAGNOSIS — J449 Chronic obstructive pulmonary disease, unspecified: Secondary | ICD-10-CM

## 2017-10-14 DIAGNOSIS — E7849 Other hyperlipidemia: Secondary | ICD-10-CM

## 2017-10-14 DIAGNOSIS — E1165 Type 2 diabetes mellitus with hyperglycemia: Secondary | ICD-10-CM

## 2017-10-14 DIAGNOSIS — M1990 Unspecified osteoarthritis, unspecified site: Secondary | ICD-10-CM

## 2017-10-14 DIAGNOSIS — R928 Other abnormal and inconclusive findings on diagnostic imaging of breast: Secondary | ICD-10-CM

## 2017-10-14 DIAGNOSIS — E1142 Type 2 diabetes mellitus with diabetic polyneuropathy: Secondary | ICD-10-CM

## 2017-10-14 DIAGNOSIS — F329 Major depressive disorder, single episode, unspecified: Secondary | ICD-10-CM

## 2017-10-14 DIAGNOSIS — I10 Essential (primary) hypertension: Secondary | ICD-10-CM

## 2017-10-14 DIAGNOSIS — E119 Type 2 diabetes mellitus without complications: Secondary | ICD-10-CM

## 2017-10-14 DIAGNOSIS — F419 Anxiety disorder, unspecified: Secondary | ICD-10-CM

## 2017-10-14 DIAGNOSIS — Z6841 Body Mass Index (BMI) 40.0 and over, adult: Principal | ICD-10-CM

## 2017-10-14 DIAGNOSIS — E785 Hyperlipidemia, unspecified: Secondary | ICD-10-CM

## 2017-10-14 DIAGNOSIS — K219 Gastro-esophageal reflux disease without esophagitis: Secondary | ICD-10-CM

## 2017-10-27 ENCOUNTER — Inpatient Hospital Stay: Payer: Medicare Other | Attending: Internal Medicine

## 2017-10-27 ENCOUNTER — Inpatient Hospital Stay (HOSPITAL_BASED_OUTPATIENT_CLINIC_OR_DEPARTMENT_OTHER): Payer: Medicare Other | Admitting: Internal Medicine

## 2017-10-27 ENCOUNTER — Other Ambulatory Visit: Payer: Self-pay

## 2017-10-27 ENCOUNTER — Encounter: Payer: Self-pay | Admitting: Internal Medicine

## 2017-10-27 VITALS — BP 129/82 | HR 82 | Temp 97.0°F | Resp 22 | Ht 61.0 in | Wt 123.0 lb

## 2017-10-27 DIAGNOSIS — D61818 Other pancytopenia: Secondary | ICD-10-CM | POA: Diagnosis not present

## 2017-10-27 DIAGNOSIS — E876 Hypokalemia: Secondary | ICD-10-CM | POA: Diagnosis not present

## 2017-10-27 DIAGNOSIS — C8331 Diffuse large B-cell lymphoma, lymph nodes of head, face, and neck: Secondary | ICD-10-CM

## 2017-10-27 DIAGNOSIS — Z79899 Other long term (current) drug therapy: Secondary | ICD-10-CM | POA: Diagnosis not present

## 2017-10-27 DIAGNOSIS — Z7982 Long term (current) use of aspirin: Secondary | ICD-10-CM | POA: Insufficient documentation

## 2017-10-27 DIAGNOSIS — J9610 Chronic respiratory failure, unspecified whether with hypoxia or hypercapnia: Secondary | ICD-10-CM | POA: Insufficient documentation

## 2017-10-27 LAB — MAGNESIUM: MAGNESIUM: 1.8 mg/dL (ref 1.7–2.4)

## 2017-10-27 LAB — CBC WITH DIFFERENTIAL/PLATELET
Basophils Absolute: 0 10*3/uL (ref 0–0.1)
Basophils Relative: 0 %
EOS PCT: 7 %
Eosinophils Absolute: 0.3 10*3/uL (ref 0–0.7)
HCT: 38.2 % (ref 35.0–47.0)
Hemoglobin: 13 g/dL (ref 12.0–16.0)
LYMPHS ABS: 1.3 10*3/uL (ref 1.0–3.6)
LYMPHS PCT: 29 %
MCH: 33.9 pg (ref 26.0–34.0)
MCHC: 34.1 g/dL (ref 32.0–36.0)
MCV: 99.5 fL (ref 80.0–100.0)
MONO ABS: 0.5 10*3/uL (ref 0.2–0.9)
MONOS PCT: 11 %
Neutro Abs: 2.4 10*3/uL (ref 1.4–6.5)
Neutrophils Relative %: 53 %
PLATELETS: 165 10*3/uL (ref 150–440)
RBC: 3.84 MIL/uL (ref 3.80–5.20)
RDW: 13.9 % (ref 11.5–14.5)
WBC: 4.5 10*3/uL (ref 3.6–11.0)

## 2017-10-27 LAB — COMPREHENSIVE METABOLIC PANEL
ALT: 17 U/L (ref 14–54)
ANION GAP: 7 (ref 5–15)
AST: 18 U/L (ref 15–41)
Albumin: 3.9 g/dL (ref 3.5–5.0)
Alkaline Phosphatase: 49 U/L (ref 38–126)
BUN: 34 mg/dL — ABNORMAL HIGH (ref 6–20)
CHLORIDE: 106 mmol/L (ref 101–111)
CO2: 26 mmol/L (ref 22–32)
Calcium: 9.6 mg/dL (ref 8.9–10.3)
Creatinine, Ser: 1.18 mg/dL — ABNORMAL HIGH (ref 0.44–1.00)
GFR calc non Af Amer: 45 mL/min — ABNORMAL LOW (ref >60)
GFR, EST AFRICAN AMERICAN: 52 mL/min — AB (ref >60)
Glucose, Bld: 99 mg/dL (ref 65–99)
POTASSIUM: 3.7 mmol/L (ref 3.5–5.1)
Sodium: 139 mmol/L (ref 135–145)
TOTAL PROTEIN: 6.5 g/dL (ref 6.5–8.1)
Total Bilirubin: 0.6 mg/dL (ref 0.3–1.2)

## 2017-10-27 MED ORDER — HEPARIN SOD (PORK) LOCK FLUSH 100 UNIT/ML IV SOLN: 500.0000 [IU] | Freq: Once | INTRAVENOUS | Status: AC

## 2017-10-27 MED ORDER — POTASSIUM CHLORIDE CRYS ER 20 MEQ PO TBCR: 20.0000 meq | EXTENDED_RELEASE_TABLET | Freq: Every day | ORAL | 4 refills | Status: AC

## 2017-10-27 MED ORDER — SODIUM CHLORIDE 0.9% FLUSH
10.0000 mL | Freq: Once | INTRAVENOUS | Status: AC
  Filled 2017-10-27: qty 10

## 2017-10-27 NOTE — Assessment & Plan Note (Addendum)
#   DLBCL-status post relapse; status post autologous stem cell transplant.  last treatment in 2015; PET May 2016- NED. Clinically no evidence of recurrence.  # Chronic respiratory failure on oxygen; multiple lung infections; ? Bronchitis vs pneumonia. currently improved.   # chronic hypokalemia- today Potassium- 3.7.  cont K 20 /day; new script given.   # Follow-up with me in appx 6 months/labs port flush/ MD; port flush in 2 months.

## 2017-10-27 NOTE — Progress Notes (Unsigned)
Patient was on the schedule today for a PAC flush and labs.  Pt reports that her port does not give blood return and it was recently flushed.  From the notes, it appears it was flushed on 10/03/17 when patient came to sx management clinic for IVF.  PAC not accessed today, pt was sent to lab to have her blood drawn peripherally.

## 2017-10-27 NOTE — Progress Notes (Signed)
[facility] OFFICE PROGRESS NOTE  Patient Care Team: Baxter Hire, MD as PCP - General (Internal Medicine) Madelyn Brunner, MD as Referring Physician (Unknown Physician Specialty) Forest Gleason, MD (Unknown Physician Specialty) Christene Lye, MD (General Surgery)  Cancer Staging No matching staging information was found for the patient.   Oncology History   1.  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  Status post 6 cycles of chemotherapy with CHOP9nd involved field radiation treatment 18,00 rads to the mantle field  and mediasinal boost 2340. finished radiation and chemotherapy in November of 2000 residual mediastinotomy mass 2 cm 2.  In May of 2001 progressive disease with mediastinal mass becoming bigger 8 cm3.  The patient underwent 2 cycles of chemotherapy withRituxan, and ICE.(June of 2001) Followed by 29more cycles  at [facility] with DHAP.and high-dose chemotherapy with  BEAM finished treatment in October of 2001. patient had significant pancytopenia as well as fever lasting for 3 months after transplant. Recurrent sinus infection and pulmonary infection 3.  Patient  underwent EUS biopsy of which was negative .  (November, 2012) 4.  Recurrent diffuse large cell lymphoma from the right side of the neck lymph node biopsy done in October of 2014;   Started on RICE october 2014 6.ppatient is now being considered for high-dose chemotherapy and stem cell support (March, 2015) 7.2nd stem cell transplant cannot be done because of the  no stem cell to harvest     Lymphoma, non-Hodgkin's ([place])   06/30/2013 Initial Diagnosis    Lymphoma, non-Hodgkin's       Diffuse large B-cell lymphoma of lymph nodes of neck ([place])    INTERVAL HISTORY:  Anna Mcbride 72 y.o.  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  Patient denies any fevers.  Denies any recent episodes of bronchitis/or infectious episodes. No unusual shortness of breath.  No new lumps or bumps. No nausea or vomiting. No significant weight loss.  REVIEW OF SYSTEMS:  A complete 10 point review of system is done which is negative except mentioned above/history of present illness.   PAST MEDICAL HISTORY :  Past Medical History:  Diagnosis Date  . Acute respiratory failure ([place]) 11/29/2014  . 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 Asthma   . Non Hodgkin's lymphoma ([place]) [id number]  . Personal history of chemotherapy     PAST SURGICAL HISTORY :   Past Surgical History:  Procedure Laterality Date  . 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 NASAL SINUS SURGERY  08/07/05 09/09/05   x 2   . VIDEO BRONCHOSCOPY Bilateral 11/30/2014   Procedure: VIDEO BRONCHOSCOPY WITHOUT FLUORO;  Surgeon: Flora Lipps, MD;  Location: [facility] ORS;  Service: Cardiopulmonary;  Laterality: Bilateral;    FAMILY HISTORY :   Family History  Problem Relation Age of Onset  . Asthma Son   . Congestive Heart Failure Mother   . Diabetes Brother   . Pulmonary embolism Father   . Breast cancer Neg Hx     SOCIAL HISTORY:   Social History   Tobacco Use  . Smoking status: Never Smoker  . Smokeless tobacco: Never Used  Substance Use Topics  . Alcohol use: Yes  . Drug use: No    ALLERGIES:  is allergic to hydrocodone-homatropine; tussionex pennkinetic er [hydrocod polst-cpm polst er]; and ciprofloxacin.  MEDICATIONS:  Current Outpatient Medications  Medication Sig Dispense Refill  . albuterol (PROVENTIL HFA;VENTOLIN HFA) 108 (90 BASE) MCG/ACT inhaler Inhale 1 puff into the lungs every 6 (six) hours as needed for wheezing or shortness of breath.    [name] albuterol (PROVENTIL) (2.5 MG/3ML) 0.083% nebulizer solution Take 3 mLs (2.5 mg total) by nebulization every 4 (four) hours as needed for wheezing or shortness of breath.  75 mL 12  . ALPRAZolam (XANAX) 0.5 MG tablet TAKE 1 TABLET BY MOUTH AT BEDTIME AS NEEDED FOR SLEEP 30 tablet 1  . AMBULATORY NON FORMULARY MEDICATION Medication Name: Incentive Spirometer Use 10-15 times daily 1 each 0  . aspirin EC 81 MG tablet Take 81 mg by mouth daily.    . budesonide (PULMICORT) 0.5 MG/2ML nebulizer solution Take 2 mLs (0.5 mg total) by nebulization 2 (two) times daily. 120 mL 11  . citalopram (CELEXA) 20 MG tablet TAKE 1 TABLET BY MOUTH ONCE DAILY 90 tablet 1  . formoterol (PERFOROMIST) 20 MCG/2ML nebulizer solution Take 2 mLs (20 mcg total) by nebulization 2 (two) times daily. 120 mL 11  . levothyroxine (SYNTHROID, LEVOTHROID) 25 MCG tablet TAKE 1 TABLET BY MOUTH ONCE DAILY ON AN EMPTY STOMACH. WAIT 30 MINUTES BEFORE TAKING OTHER MEDS. 30 tablet 4  . omeprazole (PRILOSEC) 20 MG capsule TAKE 1 CAPSULE BY MOUTH TWICE DAILY BEFORE MEALS 180 capsule 3  . ondansetron (ZOFRAN ODT) 4 MG disintegrating tablet Take 1 tablet (4 mg total) every 8 (eight) hours as needed by mouth for nausea or vomiting. 20 tablet 0  . potassium chloride SA (K-DUR,KLOR-CON) 20 MEQ tablet Take 1 tablet (20 mEq total) by mouth daily. 90 tablet 4  . Respiratory Therapy Supplies (FLUTTER) DEVI Use 10-15 times daily 1 each 0  . vitamin C (ASCORBIC ACID) 500 MG tablet Take 500 mg by mouth daily.     No current facility-administered medications for this visit.    Facility-Administered Medications Ordered in Other Visits  Medication Dose Route Frequency Provider Last Rate Last Dose  . heparin lock flush 100 unit/mL  500 Units Intravenous Once Charlaine Dalton R, MD      . sodium chloride flush (NS) 0.9 % injection 10 mL  10 mL Intravenous Once Cammie Sickle, MD        PHYSICAL EXAMINATION: ECOG PERFORMANCE STATUS: 1 - Symptomatic but completely ambulatory  BP 129/82   Pulse 82   Temp (!) 97 F (36.1 C) (Tympanic)   Resp (!) 22   Ht 5\' 1"  (1.549 m)   Wt 123 lb (55.8 kg)   LMP  (LMP  Unknown)   BMI 23.24 kg/m   Filed Weights   10/27/17 1518  Weight: 123 lb (55.8 kg)    GENERAL: Thin built moderately nourished;  Alert, no distress and comfortable.  She is alone.  Wearing oxygen. EYES: no pallor or icterus OROPHARYNX: no thrush or ulceration; good dentition  NECK: supple, no masses felt LYMPH:  no palpable lymphadenopathy in the cervical, axillary or inguinal regions LUNGS: Bilateral coarse breath sounds to auscultation and  No wheeze or crackles HEART/CVS: regular rate & rhythm and no murmurs; No lower extremity edema ABDOMEN:abdomen soft, non-tender and normal bowel sounds Musculoskeletal:no cyanosis of digits and no clubbing  PSYCH: alert & oriented x 3 with fluent speech NEURO: no focal motor/sensory deficits SKIN:  no rashes or significant lesions  LABORATORY DATA:  I have reviewed the data as listed    Component Value Date/Time   NA 139  10/27/2017 1423   NA 140 11/16/2014 1415   K 3.7 10/27/2017 1423   K 3.3 (L) 11/16/2014 1415   CL 106 10/27/2017 1423   CL 107 11/16/2014 1415   CO2 26 10/27/2017 1423   CO2 29 11/16/2014 1415   GLUCOSE 99 10/27/2017 1423   GLUCOSE 117 (H) 11/16/2014 1415   BUN 34 (H) 10/27/2017 1423   BUN 31 (H) 11/16/2014 1415   CREATININE 1.18 (H) 10/27/2017 1423   CREATININE 1.19 (H) 11/16/2014 1415   CALCIUM 9.6 10/27/2017 1423   CALCIUM 9.1 11/16/2014 1415   PROT 6.5 10/27/2017 1423   PROT 6.8 11/16/2014 1415   ALBUMIN 3.9 10/27/2017 1423   ALBUMIN 3.8 11/16/2014 1415   AST 18 10/27/2017 1423   AST 17 11/16/2014 1415   ALT 17 10/27/2017 1423   ALT 11 (L) 11/16/2014 1415   ALKPHOS 49 10/27/2017 1423   ALKPHOS 115 11/16/2014 1415   BILITOT 0.6 10/27/2017 1423   BILITOT 0.4 11/16/2014 1415   GFRNONAA 45 (L) 10/27/2017 1423   GFRNONAA 46 (L) 11/16/2014 1415   GFRAA 52 (L) 10/27/2017 1423   GFRAA 54 (L) 11/16/2014 1415    No results found for: SPEP, UPEP  Lab Results  Component Value Date   WBC 4.5 10/27/2017    NEUTROABS 2.4 10/27/2017   HGB 13.0 10/27/2017   HCT 38.2 10/27/2017   MCV 99.5 10/27/2017   PLT 165 10/27/2017      Chemistry      Component Value Date/Time   NA 139 10/27/2017 1423   NA 140 11/16/2014 1415   K 3.7 10/27/2017 1423   K 3.3 (L) 11/16/2014 1415   CL 106 10/27/2017 1423   CL 107 11/16/2014 1415   CO2 26 10/27/2017 1423   CO2 29 11/16/2014 1415   BUN 34 (H) 10/27/2017 1423   BUN 31 (H) 11/16/2014 1415   CREATININE 1.18 (H) 10/27/2017 1423   CREATININE 1.19 (H) 11/16/2014 1415      Component Value Date/Time   CALCIUM 9.6 10/27/2017 1423   CALCIUM 9.1 11/16/2014 1415   ALKPHOS 49 10/27/2017 1423   ALKPHOS 115 11/16/2014 1415   AST 18 10/27/2017 1423   AST 17 11/16/2014 1415   ALT 17 10/27/2017 1423   ALT 11 (L) 11/16/2014 1415   BILITOT 0.6 10/27/2017 1423   BILITOT 0.4 11/16/2014 1415       RADIOGRAPHIC STUDIES: I have personally reviewed the radiological images as listed and agreed with the findings in the report. No results found.   ASSESSMENT & PLAN:  Diffuse large B-cell lymphoma of lymph nodes of neck ([place]) # DLBCL-status post relapse; status post autologous stem cell transplant.  last treatment in 2015; PET May 2016- NED. Clinically no evidence of recurrence.  # Chronic respiratory failure on oxygen; multiple lung infections; ? Bronchitis vs pneumonia. currently improved.   # chronic hypokalemia- today Potassium- 3.7.  cont K 20 /day; new script given.   # Follow-up with me in appx 6 months/labs port flush/ MD; port flush in 2 months.    No orders of the defined types were placed in this encounter.  All questions were answered. The patient knows to call the clinic with any problems, questions or concerns.      Cammie Sickle, MD 10/27/2017 8:12 PM

## 2017-11-04 DIAGNOSIS — E785 Hyperlipidemia, unspecified: Secondary | ICD-10-CM

## 2017-11-04 DIAGNOSIS — J449 Chronic obstructive pulmonary disease, unspecified: Secondary | ICD-10-CM

## 2017-11-04 MED ORDER — ALBUTEROL SULFATE HFA 108 (90 BASE) MCG/ACT IN AERS
1 refills | Status: CP
Start: 2017-11-04 — End: 2018-04-14

## 2017-11-04 MED ORDER — PRAVASTATIN SODIUM 40 MG PO TABS
0 refills | Status: CP
Start: 2017-11-04 — End: 2018-03-03

## 2017-11-06 DIAGNOSIS — N6310 Unspecified lump in the right breast, unspecified quadrant: Secondary | ICD-10-CM

## 2017-11-21 ENCOUNTER — Other Ambulatory Visit: Payer: Self-pay | Admitting: Internal Medicine

## 2017-11-21 DIAGNOSIS — Z1231 Encounter for screening mammogram for malignant neoplasm of breast: Secondary | ICD-10-CM

## 2017-11-30 ENCOUNTER — Other Ambulatory Visit: Payer: Self-pay | Admitting: Internal Medicine

## 2017-11-30 DIAGNOSIS — E039 Hypothyroidism, unspecified: Secondary | ICD-10-CM

## 2017-12-09 ENCOUNTER — Ambulatory Visit
Admission: RE | Admit: 2017-12-09 | Discharge: 2017-12-09 | Disposition: A | Discharge status: Home / Self Care | Payer: Medicare Other | Source: Ambulatory Visit | Attending: Internal Medicine | Admitting: Internal Medicine

## 2017-12-09 DIAGNOSIS — Z1231 Encounter for screening mammogram for malignant neoplasm of breast: Secondary | ICD-10-CM | POA: Diagnosis present

## 2017-12-15 ENCOUNTER — Other Ambulatory Visit: Payer: Self-pay | Admitting: Internal Medicine

## 2017-12-16 ENCOUNTER — Encounter: Payer: Self-pay | Admitting: Nurse Practitioner

## 2017-12-16 ENCOUNTER — Inpatient Hospital Stay: Payer: Medicare Other | Attending: Nurse Practitioner | Admitting: Nurse Practitioner

## 2017-12-16 ENCOUNTER — Ambulatory Visit
Admission: RE | Admit: 2017-12-16 | Discharge: 2017-12-16 | Disposition: A | Discharge status: Home / Self Care | Payer: Medicare Other | Source: Ambulatory Visit | Attending: Nurse Practitioner | Admitting: Nurse Practitioner

## 2017-12-16 ENCOUNTER — Telehealth: Payer: Self-pay | Admitting: *Deleted

## 2017-12-16 ENCOUNTER — Inpatient Hospital Stay: Payer: Medicare Other

## 2017-12-16 VITALS — BP 127/80 | HR 93 | Temp 98.1°F | Resp 20 | Wt 120.0 lb

## 2017-12-16 DIAGNOSIS — Z833 Family history of diabetes mellitus: Secondary | ICD-10-CM

## 2017-12-16 DIAGNOSIS — R1011 Right upper quadrant pain: Secondary | ICD-10-CM | POA: Diagnosis not present

## 2017-12-16 DIAGNOSIS — E785 Hyperlipidemia, unspecified: Secondary | ICD-10-CM | POA: Diagnosis not present

## 2017-12-16 DIAGNOSIS — Z9221 Personal history of antineoplastic chemotherapy: Secondary | ICD-10-CM

## 2017-12-16 DIAGNOSIS — J9611 Chronic respiratory failure with hypoxia: Secondary | ICD-10-CM | POA: Insufficient documentation

## 2017-12-16 DIAGNOSIS — Z825 Family history of asthma and other chronic lower respiratory diseases: Secondary | ICD-10-CM | POA: Diagnosis not present

## 2017-12-16 DIAGNOSIS — K838 Other specified diseases of biliary tract: Secondary | ICD-10-CM | POA: Diagnosis not present

## 2017-12-16 DIAGNOSIS — J209 Acute bronchitis, unspecified: Secondary | ICD-10-CM

## 2017-12-16 DIAGNOSIS — R05 Cough: Secondary | ICD-10-CM

## 2017-12-16 DIAGNOSIS — J84112 Idiopathic pulmonary fibrosis: Secondary | ICD-10-CM | POA: Diagnosis present

## 2017-12-16 DIAGNOSIS — Z8572 Personal history of non-Hodgkin lymphomas: Secondary | ICD-10-CM | POA: Diagnosis present

## 2017-12-16 DIAGNOSIS — Z7951 Long term (current) use of inhaled steroids: Secondary | ICD-10-CM

## 2017-12-16 DIAGNOSIS — Z7982 Long term (current) use of aspirin: Secondary | ICD-10-CM

## 2017-12-16 DIAGNOSIS — C8331 Diffuse large B-cell lymphoma, lymph nodes of head, face, and neck: Secondary | ICD-10-CM

## 2017-12-16 DIAGNOSIS — K219 Gastro-esophageal reflux disease without esophagitis: Secondary | ICD-10-CM | POA: Diagnosis not present

## 2017-12-16 DIAGNOSIS — R112 Nausea with vomiting, unspecified: Secondary | ICD-10-CM | POA: Diagnosis present

## 2017-12-16 DIAGNOSIS — Z7952 Long term (current) use of systemic steroids: Secondary | ICD-10-CM

## 2017-12-16 DIAGNOSIS — Z9484 Stem cells transplant status: Secondary | ICD-10-CM

## 2017-12-16 DIAGNOSIS — Z923 Personal history of irradiation: Secondary | ICD-10-CM

## 2017-12-16 DIAGNOSIS — N183 Chronic kidney disease, stage 3 (moderate): Secondary | ICD-10-CM | POA: Diagnosis not present

## 2017-12-16 DIAGNOSIS — Z8249 Family history of ischemic heart disease and other diseases of the circulatory system: Secondary | ICD-10-CM

## 2017-12-16 DIAGNOSIS — R0600 Dyspnea, unspecified: Secondary | ICD-10-CM

## 2017-12-16 DIAGNOSIS — E039 Hypothyroidism, unspecified: Secondary | ICD-10-CM | POA: Diagnosis not present

## 2017-12-16 DIAGNOSIS — J9610 Chronic respiratory failure, unspecified whether with hypoxia or hypercapnia: Secondary | ICD-10-CM | POA: Diagnosis not present

## 2017-12-16 DIAGNOSIS — R5383 Other fatigue: Secondary | ICD-10-CM | POA: Diagnosis not present

## 2017-12-16 DIAGNOSIS — Z9981 Dependence on supplemental oxygen: Secondary | ICD-10-CM

## 2017-12-16 DIAGNOSIS — J449 Chronic obstructive pulmonary disease, unspecified: Secondary | ICD-10-CM | POA: Diagnosis not present

## 2017-12-16 DIAGNOSIS — J45909 Unspecified asthma, uncomplicated: Secondary | ICD-10-CM | POA: Insufficient documentation

## 2017-12-16 DIAGNOSIS — Z79899 Other long term (current) drug therapy: Secondary | ICD-10-CM | POA: Insufficient documentation

## 2017-12-16 DIAGNOSIS — R918 Other nonspecific abnormal finding of lung field: Secondary | ICD-10-CM | POA: Insufficient documentation

## 2017-12-16 DIAGNOSIS — R079 Chest pain, unspecified: Secondary | ICD-10-CM | POA: Diagnosis present

## 2017-12-16 MED ORDER — IPRATROPIUM-ALBUTEROL 0.5-2.5 (3) MG/3ML IN SOLN
RESPIRATORY_TRACT | Status: AC
  Filled 2017-12-16: qty 3

## 2017-12-16 MED ORDER — PREDNISONE 20 MG PO TABS: 40.0000 mg | ORAL_TABLET | Freq: Every day | ORAL | 0 refills | Status: AC

## 2017-12-16 MED ORDER — LEVOFLOXACIN 500 MG PO TABS: 500.0000 mg | ORAL_TABLET | Freq: Every day | ORAL | 0 refills | Status: DC

## 2017-12-16 MED ORDER — IPRATROPIUM-ALBUTEROL 0.5-2.5 (3) MG/3ML IN SOLN
3.0000 mL | Freq: Once | RESPIRATORY_TRACT | Status: AC
  Administered 2017-12-16: 3 mL via RESPIRATORY_TRACT

## 2017-12-16 NOTE — Telephone Encounter (Signed)
Patient complains of increased shortness of breath has nonproductive cough. Denies fever. Reports that her chest is so tight she cannot stand to wear her bra. This has been going on for last 4 days and is getting worse. Patient accepted apt for this afternoon at 3

## 2017-12-16 NOTE — Progress Notes (Signed)
Symptom Management [place]  Telephone:([phone]) [phone] [phone]  Patient Care Team: Baxter Hire, MD as PCP - General (Internal Medicine) Madelyn Brunner, MD as Referring Physician (Unknown Physician Specialty) Christene Lye, MD (General Surgery) Cammie Sickle, MD as Medical Oncologist (Hematology and Oncology) Flora Lipps, MD as Consulting Physician (Pulmonary Disease)   Name of the patient: Anna Mcbride  [id number]  02-17-1946   Date of visit: 12/16/17  Diagnosis- DLBCL  Chief complaint/ Reason for visit- SOB  Heme/Onc history: Patient last evaluated by primary oncologist, Dr. Rogue Bussing, on 10/27/2017.  She has a low history of relapsed diffuse large B-cell lymphoma status post cell transplant, most currently in remission, with intermittent pancytopenia. Oncology History   1.  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  Status post 6 cycles of chemotherapy with CHOP9nd involved field radiation treatment 18,00 rads to the mantle field  and mediasinal boost 2340. finished radiation and chemotherapy in November of 2000 residual mediastinotomy mass 2 cm 2.  In May of 2001 progressive disease with mediastinal mass becoming bigger 8 cm3.  The patient underwent 2 cycles of chemotherapy withRituxan, and ICE.(June of 2001) Followed by 104more cycles  at [facility] with DHAP.and high-dose chemotherapy with  BEAM finished treatment in October of 2001. patient had significant pancytopenia as well as fever lasting for 3 months after transplant. Recurrent sinus infection and pulmonary infection 3.  Patient  underwent EUS biopsy of which was negative .  (November, 2012) 4.  Recurrent diffuse large cell lymphoma from the right side of the neck lymph node biopsy done in October of 2014;   Started on RICE october 2014 6.ppatient is now being considered for high-dose chemotherapy and stem  cell support (March, 2015) 7.2nd stem cell transplant cannot be done because of the  no stem cell to harvest     Lymphoma, non-Hodgkin's ([place])   06/30/2013 Initial Diagnosis    Lymphoma, non-Hodgkin's       Diffuse large B-cell lymphoma of lymph nodes of neck ([facility])     Interval history- Anna Mcbride is a 72 y.o. female who shortening of breath. Associated signs and symptoms include: dry cough and wheezing, anorexia, fatigue, shortness of breath, weakness, weight loss, wheezing and cough. She has had these symptoms for 3 days. She describes these symptoms as gradually worsening and severely interfering with QOL and her ability to perform ADLs. Symptoms worsen throughout the day. She has a history of chronic respiratory failure and is on oxygen continuously, 2 L.  No recent bronchitis or pneumonia.  History of multiple lung infections however.  Has not taken scheduled maintenance nebulizers today.  Last seen by Dr. Mortimer Fries, pulmonologist, on 07/29/2017. Her husband reports concern that her symptoms will interfere with upcoming 50th wedding anniversary party.  ECOG FS:2 - Symptomatic, <50% confined to bed  Review of systems- Review of Systems  Constitutional: Positive for malaise/fatigue and weight loss. Negative for chills, diaphoresis and fever.  HENT: Positive for congestion and hearing loss.   Eyes: Negative.   Respiratory: Positive for cough, shortness of breath and wheezing. Negative for hemoptysis and sputum production.   Cardiovascular: Negative.   Gastrointestinal: Negative.   Genitourinary: Negative.   Musculoskeletal: Negative.   Skin: Negative.   Neurological: Negative.   Endo/Heme/Allergies: Negative.   Psychiatric/Behavioral: Negative.      Current treatment- not on active treatment  Allergies  Allergen Reactions  .  Hydrocodone-Homatropine Itching  . Tussionex Pennkinetic Er [Hydrocod Polst-Cpm Polst Er] Itching  . Ciprofloxacin Rash     Past Medical History:    Diagnosis Date  . Acute respiratory failure ([place]) 11/29/2014  . 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 Asthma   . Non Hodgkin's lymphoma ([place]) [id number]  . Personal history of chemotherapy      Past Surgical History:  Procedure Laterality Date  . 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 NASAL SINUS SURGERY  08/07/05 09/09/05   x 2   . VIDEO BRONCHOSCOPY Bilateral 11/30/2014   Procedure: VIDEO BRONCHOSCOPY WITHOUT FLUORO;  Surgeon: Flora Lipps, MD;  Location: [facility] ORS;  Service: Cardiopulmonary;  Laterality: Bilateral;    Social History   Socioeconomic History  . Marital status: Married    Spouse name: Not on file  . Number of children: 2  . Years of education: Not on file  . Highest education level: Not on file  Occupational History  . Occupation: Retired    Comment: Tour manager  . Financial resource strain: Not on file  . Food insecurity:    Worry: Not on file    Inability: Not on file  . Transportation needs:    Medical: Not on file    Non-medical: Not on file  Tobacco Use  . Smoking status: Never Smoker  . Smokeless tobacco: Never Used  Substance and Sexual Activity  . Alcohol use: Yes  . Drug use: No  . Sexual activity: Not on file  Lifestyle  . Physical activity:    Days per week: Not on file    Minutes per session: Not on file  . Stress: Not on file  Relationships  . Social connections:    Talks on phone: Not on file    Gets together: Not on file    Attends religious service: Not on file    Active member of club or organization: Not on file    Attends meetings of clubs or organizations: Not on file    Relationship status: Not on file  . Intimate partner violence:    Fear of current or ex partner: Not on file    Emotionally abused: Not on file    Physically abused: Not on file    Forced sexual activity: Not on file  Other Topics Concern  . Not on file  Social History Narrative  . Not on file    Family History  Problem Relation Age of  Onset  . Asthma Son   . Congestive Heart Failure Mother   . Diabetes Brother   . Pulmonary embolism Father   . Breast cancer Neg Hx      Current Outpatient Medications:  .  albuterol (PROVENTIL HFA;VENTOLIN HFA) 108 (90 BASE) MCG/ACT inhaler, Inhale 1 puff into the lungs every 6 (six) hours as needed for wheezing or shortness of breath., Disp: , Rfl:  .  albuterol (PROVENTIL) (2.5 MG/3ML) 0.083% nebulizer solution, Take 3 mLs (2.5 mg total) by nebulization every 4 (four) hours as needed for wheezing or shortness of breath., Disp: 75 mL, Rfl: 12 .  AMBULATORY NON FORMULARY MEDICATION, Medication Name: Incentive Spirometer Use 10-15 times daily, Disp: 1 each, Rfl: 0 .  aspirin EC 81 MG tablet, Take 81 mg by mouth daily., Disp: , Rfl:  .  budesonide (PULMICORT) 0.5 MG/2ML nebulizer solution, Take 2 mLs (0.5 mg total) by nebulization 2 (two) times daily., Disp: 120 mL, Rfl: 11 .  citalopram (CELEXA) 20 MG tablet, TAKE 1  TABLET BY MOUTH ONCE DAILY, Disp: 90 tablet, Rfl: 1 .  formoterol (PERFOROMIST) 20 MCG/2ML nebulizer solution, Take 2 mLs (20 mcg total) by nebulization 2 (two) times daily., Disp: 120 mL, Rfl: 11 .  levothyroxine (SYNTHROID, LEVOTHROID) 25 MCG tablet, TAKE 1 TABLET BY MOUTH ONCE DAILY ON AN EMPTY STOMACH. WAIT 30 MINUTES BEFORE TAKING OTHER MEDS., Disp: 30 tablet, Rfl: 4 .  omeprazole (PRILOSEC) 20 MG capsule, TAKE 1 CAPSULE BY MOUTH TWICE DAILY BEFORE MEALS, Disp: 180 capsule, Rfl: 3 .  potassium chloride SA (K-DUR,KLOR-CON) 20 MEQ tablet, Take 1 tablet (20 mEq total) by mouth daily., Disp: 90 tablet, Rfl: 4 .  Respiratory Therapy Supplies (FLUTTER) DEVI, Use 10-15 times daily, Disp: 1 each, Rfl: 0 .  vitamin C (ASCORBIC ACID) 500 MG tablet, Take 500 mg by mouth daily., Disp: , Rfl:  .  ALPRAZolam (XANAX) 0.5 MG tablet, TAKE 1 TABLET BY MOUTH AT BEDTIME AS NEEDED FOR SLEEP (Patient not taking: Reported on 12/16/2017), Disp: 30 tablet, Rfl: 1 .  ondansetron (ZOFRAN ODT) 4 MG  disintegrating tablet, Take 1 tablet (4 mg total) every 8 (eight) hours as needed by mouth for nausea or vomiting. (Patient not taking: Reported on 12/16/2017), Disp: 20 tablet, Rfl: 0 No current facility-administered medications for this visit.   Facility-Administered Medications Ordered in Other Visits:  .  heparin lock flush 100 unit/mL, 500 Units, Intravenous, Once, Brahmanday, Govinda R, MD .  sodium chloride flush (NS) 0.9 % injection 10 mL, 10 mL, Intravenous, Once, Cammie Sickle, MD  Physical exam:  Vitals:   12/16/17 1508  BP: 127/80  Pulse: 93  Resp: 20  Temp: 98.1 F (36.7 C)  TempSrc: Tympanic  SpO2: 97%  Weight: 120 lb (54.4 kg)   Physical Exam  Constitutional: She is oriented to person, place, and time.  Thin, elderly, frail.  Accompanied by husband.  In exam room  HENT:  Head: Atraumatic.  Nose: Nose normal.  Mouth/Throat: Oropharynx is clear and moist. No oropharyngeal exudate.  Eyes: Conjunctivae are normal. No scleral icterus.  Neck: Normal range of motion.  Cardiovascular: Normal rate, regular rhythm and normal heart sounds.  Pulmonary/Chest: Accessory muscle usage present. She has wheezes in the right upper field.  On oxygen via nasal cannula.  2 L.[name] Speaking in short sentences.  Abdominal: Soft. Bowel sounds are normal.  Musculoskeletal: She exhibits no edema.  Neurological: She is alert and oriented to person, place, and time.  Skin: Skin is warm and dry. There is pallor.  Psychiatric: Her mood appears anxious.     CMP Latest Ref Rng & Units 10/27/2017  Glucose 65 - 99 mg/dL 99  BUN 6 - 20 mg/dL 34(H)  Creatinine 0.44 - 1.00 mg/dL 1.18(H)  Sodium 135 - 145 mmol/L 139  Potassium 3.5 - 5.1 mmol/L 3.7  Chloride 101 - 111 mmol/L 106  CO2 22 - 32 mmol/L 26  Calcium 8.9 - 10.3 mg/dL 9.6  Total Protein 6.5 - 8.1 g/dL 6.5  Total Bilirubin 0.3 - 1.2 mg/dL 0.6  Alkaline Phos 38 - 126 U/L 49  AST 15 - 41 U/L 18  ALT 14 - 54 U/L 17   CBC Latest Ref  Rng & Units 10/27/2017  WBC 3.6 - 11.0 K/uL 4.5  Hemoglobin 12.0 - 16.0 g/dL 13.0  Hematocrit 35.0 - 47.0 % 38.2  Platelets 150 - 440 K/uL 165    No images are attached to the encounter.  Mm 3d Screen Breast Bilateral  Result Date: 12/09/2017 CLINICAL  DATA:  Screening. EXAM: DIGITAL SCREENING BILATERAL MAMMOGRAM WITH TOMO AND CAD COMPARISON:  Previous exam(s). ACR Breast Density Category c: The breast tissue is heterogeneously dense, which may obscure small masses. FINDINGS: There are no findings suspicious for malignancy. Images were processed with CAD. IMPRESSION: No mammographic evidence of malignancy. A result letter of this screening mammogram will be mailed directly to the patient. RECOMMENDATION: Screening mammogram in one year. (Code:SM-B-01Y) BI-RADS CATEGORY  1: Negative. Electronically Signed   By: Fidela Salisbury M.D.   On: 12/09/2017 14:37     Assessment and plan- Patient is a 72 y.o. female with history of diffuse large B-cell lymphoma who presents to symptom management clinic for shortness of breath.   1. DLBCL-status post relapse, status post autologous stem cell transplant.  Last treatment in 2015.  PET 11/2014 showed no evidence of disease.. Follow-up with Dr. Rogue Bussing for continued monitoring.  2.  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 On O2 chronically. Today, SpO2 97% on 2L via [place]- baseline. On Perforomist, Pulmicort with albuterol neb and inhaler prn through Dr. Mortimer Fries of pulm. Encouraged home nebs and inhalers and flutter valve. ER precautions provided.   3. Shortness of Breath- bronchitis vs respiratory infection vs a/crf.  Duoneb given in clinic. Wheezing improved. Patient expresses interest In treating outpatient. Will get chest x-ray and start on levaquin (allergy to cipro but per patient able to tolerate levaquin) and steroids.   Patient to follow-up  with Dr. Mortimer Fries for re-evaluation in the week. Instructed to call clinic if symptoms do not improve in next 2-3 days or worsen.   Visit Diagnosis 1. Diffuse large B-cell lymphoma of lymph nodes of neck (HCC)   2. Chronic respiratory failure with hypoxia, on home O2 therapy ([place])   3. Acute bronchitis with coexisting condition requiring prophylactic treatment     Patient expressed understanding and was in agreement with this plan. She also understands that She can call clinic at any time with any questions, concerns, or complaints.   Beckey Rutter, DNP, AGNP-C [place] [facility] [place] (work cell) [phone] (office) 12/16/17 5:46 PM  CC: Dr. Rogue Bussing

## 2017-12-16 NOTE — Telephone Encounter (Signed)
md made aware.

## 2017-12-17 ENCOUNTER — Telehealth: Payer: Self-pay | Admitting: Internal Medicine

## 2017-12-17 ENCOUNTER — Ambulatory Visit: Payer: Medicare Other | Admitting: Internal Medicine

## 2017-12-17 ENCOUNTER — Other Ambulatory Visit
Admission: RE | Admit: 2017-12-17 | Discharge: 2017-12-17 | Disposition: A | Discharge status: Home / Self Care | Payer: Medicare Other | Source: Ambulatory Visit | Attending: Internal Medicine | Admitting: Internal Medicine

## 2017-12-17 ENCOUNTER — Encounter: Payer: Self-pay | Admitting: Internal Medicine

## 2017-12-17 ENCOUNTER — Telehealth: Payer: Self-pay | Admitting: Nurse Practitioner

## 2017-12-17 VITALS — BP 98/60 | HR 97 | Ht 61.0 in | Wt 120.0 lb

## 2017-12-17 DIAGNOSIS — R072 Precordial pain: Secondary | ICD-10-CM | POA: Diagnosis present

## 2017-12-17 LAB — FIBRIN DERIVATIVES D-DIMER (ARMC ONLY): FIBRIN DERIVATIVES D-DIMER (ARMC): 1313.55 ng{FEU}/mL — AB (ref 0.00–499.00)

## 2017-12-17 NOTE — Telephone Encounter (Signed)
Call patient and persuaded that she needs to be seen sooner than 6/12. Pt scheduled with DR this afternoon.

## 2017-12-17 NOTE — Patient Instructions (Signed)
Continue using antibiotics and prednisone, call back tomorrow afternoon if not doing better.
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
## 2017-12-17 NOTE — Telephone Encounter (Signed)
Patient spouse called to scheduled asap appt with Dr. Mortimer Fries for SOB   Next available in June with [facility] .  Offer to see another provider sooner was declined.  Patient only wants to see dr. Mortimer Fries.  Scheduled 6/13.

## 2017-12-17 NOTE — Telephone Encounter (Signed)
Called patient with results of chest x-ray which shows pneumonia. On levaquin and starting steroids today. Advised again of risk of tendon rupture with levaquin and added risk with steroids in older population. Advised to stop treatment and seek care if symptomatic. She has appointment to follow-up with Dr. Mortimer Fries June 13th. Offered that if she does not improve or symptoms don't resolve to call clinic as we may need to adjust treatment.

## 2017-12-18 ENCOUNTER — Ambulatory Visit
Admission: RE | Admit: 2017-12-18 | Discharge: 2017-12-18 | Disposition: A | Discharge status: Home / Self Care | Payer: Medicare Other | Source: Ambulatory Visit | Attending: Internal Medicine | Admitting: Internal Medicine

## 2017-12-18 ENCOUNTER — Other Ambulatory Visit: Payer: Self-pay | Admitting: *Deleted

## 2017-12-18 ENCOUNTER — Telehealth: Payer: Self-pay | Admitting: *Deleted

## 2017-12-18 DIAGNOSIS — R0602 Shortness of breath: Secondary | ICD-10-CM

## 2017-12-18 DIAGNOSIS — I700 Atherosclerosis of aorta: Secondary | ICD-10-CM | POA: Insufficient documentation

## 2017-12-18 DIAGNOSIS — R7989 Other specified abnormal findings of blood chemistry: Secondary | ICD-10-CM

## 2017-12-18 DIAGNOSIS — C8590 Non-Hodgkin lymphoma, unspecified, unspecified site: Secondary | ICD-10-CM | POA: Insufficient documentation

## 2017-12-18 DIAGNOSIS — R918 Other nonspecific abnormal finding of lung field: Secondary | ICD-10-CM

## 2017-12-18 DIAGNOSIS — K838 Other specified diseases of biliary tract: Secondary | ICD-10-CM | POA: Diagnosis not present

## 2017-12-18 LAB — POCT I-STAT CREATININE: CREATININE: 1.3 mg/dL — AB (ref 0.44–1.00)

## 2017-12-18 MED ORDER — IOPAMIDOL (ISOVUE-370) INJECTION 76%: 75.0000 mL | Freq: Once | INTRAVENOUS | Status: DC | PRN

## 2017-12-18 MED ORDER — IOPAMIDOL (ISOVUE-370) INJECTION 76%
60.0000 mL | Freq: Once | INTRAVENOUS | Status: AC | PRN
  Administered 2017-12-18: 60 mL via INTRAVENOUS

## 2017-12-18 NOTE — Telephone Encounter (Signed)
CTA Chest scheduled for today, 12/18/17 now at [facility]. Called and spoke with pt's husband and he is aware of appointment date, time, location and NPO until after test.   Pt will need BMET placed. Rhonda J Cobb

## 2017-12-18 NOTE — Telephone Encounter (Signed)
-----   Message from [name], MD sent at 12/18/2017 10:48 AM EDT ----- Regarding: needs ct chest D-dimer was elevated. Pt needs CT angio stat.

## 2017-12-18 NOTE — Telephone Encounter (Signed)
Pt notified abnormal d-dimer. She is aware she will getting STAT CT angio.

## 2017-12-18 NOTE — Addendum Note (Signed)
Addended by: Stephanie Coup on: 12/18/2017 12:51 PM   Modules accepted: Orders

## 2017-12-18 NOTE — Progress Notes (Signed)
bmet  

## 2017-12-19 ENCOUNTER — Emergency Department: Payer: Medicare Other

## 2017-12-19 ENCOUNTER — Inpatient Hospital Stay
Admission: EM | Admit: 2017-12-19 | Discharge: 2017-12-21 | DRG: 445 | Disposition: A | Discharge status: Home / Self Care | Payer: Medicare Other | Attending: Internal Medicine | Admitting: Internal Medicine

## 2017-12-19 ENCOUNTER — Other Ambulatory Visit: Payer: Self-pay

## 2017-12-19 ENCOUNTER — Encounter: Payer: Self-pay | Admitting: Emergency Medicine

## 2017-12-19 DIAGNOSIS — K838 Other specified diseases of biliary tract: Secondary | ICD-10-CM | POA: Diagnosis not present

## 2017-12-19 DIAGNOSIS — Z9221 Personal history of antineoplastic chemotherapy: Secondary | ICD-10-CM | POA: Diagnosis not present

## 2017-12-19 DIAGNOSIS — R1013 Epigastric pain: Secondary | ICD-10-CM

## 2017-12-19 DIAGNOSIS — E785 Hyperlipidemia, unspecified: Secondary | ICD-10-CM | POA: Diagnosis present

## 2017-12-19 DIAGNOSIS — Z7951 Long term (current) use of inhaled steroids: Secondary | ICD-10-CM | POA: Diagnosis not present

## 2017-12-19 DIAGNOSIS — E039 Hypothyroidism, unspecified: Secondary | ICD-10-CM | POA: Diagnosis not present

## 2017-12-19 DIAGNOSIS — R112 Nausea with vomiting, unspecified: Secondary | ICD-10-CM | POA: Diagnosis not present

## 2017-12-19 DIAGNOSIS — Z8572 Personal history of non-Hodgkin lymphomas: Secondary | ICD-10-CM | POA: Diagnosis not present

## 2017-12-19 DIAGNOSIS — Z7982 Long term (current) use of aspirin: Secondary | ICD-10-CM | POA: Diagnosis not present

## 2017-12-19 DIAGNOSIS — N183 Chronic kidney disease, stage 3 unspecified: Secondary | ICD-10-CM | POA: Diagnosis present

## 2017-12-19 DIAGNOSIS — J45909 Unspecified asthma, uncomplicated: Secondary | ICD-10-CM | POA: Diagnosis present

## 2017-12-19 DIAGNOSIS — R079 Chest pain, unspecified: Secondary | ICD-10-CM

## 2017-12-19 DIAGNOSIS — K219 Gastro-esophageal reflux disease without esophagitis: Secondary | ICD-10-CM | POA: Diagnosis present

## 2017-12-19 DIAGNOSIS — Z923 Personal history of irradiation: Secondary | ICD-10-CM | POA: Diagnosis not present

## 2017-12-19 DIAGNOSIS — Z9484 Stem cells transplant status: Secondary | ICD-10-CM | POA: Diagnosis not present

## 2017-12-19 DIAGNOSIS — J84112 Idiopathic pulmonary fibrosis: Secondary | ICD-10-CM | POA: Diagnosis not present

## 2017-12-19 DIAGNOSIS — Z9981 Dependence on supplemental oxygen: Secondary | ICD-10-CM | POA: Diagnosis not present

## 2017-12-19 DIAGNOSIS — R1011 Right upper quadrant pain: Secondary | ICD-10-CM | POA: Diagnosis not present

## 2017-12-19 DIAGNOSIS — J449 Chronic obstructive pulmonary disease, unspecified: Secondary | ICD-10-CM | POA: Diagnosis not present

## 2017-12-19 DIAGNOSIS — J9610 Chronic respiratory failure, unspecified whether with hypoxia or hypercapnia: Secondary | ICD-10-CM | POA: Diagnosis not present

## 2017-12-19 HISTORY — DX: Other specified diseases of biliary tract: K83.8

## 2017-12-19 LAB — CBC
HEMATOCRIT: 39.2 % (ref 35.0–47.0)
Hemoglobin: 13.2 g/dL (ref 12.0–16.0)
MCH: 33.2 pg (ref 26.0–34.0)
MCHC: 33.6 g/dL (ref 32.0–36.0)
MCV: 98.8 fL (ref 80.0–100.0)
PLATELETS: 221 10*3/uL (ref 150–440)
RBC: 3.97 MIL/uL (ref 3.80–5.20)
RDW: 13.9 % (ref 11.5–14.5)
WBC: 9.4 10*3/uL (ref 3.6–11.0)

## 2017-12-19 LAB — BASIC METABOLIC PANEL
Anion gap: 13 (ref 5–15)
BUN: 40 mg/dL — AB (ref 6–20)
CHLORIDE: 103 mmol/L (ref 101–111)
CO2: 22 mmol/L (ref 22–32)
CREATININE: 1.29 mg/dL — AB (ref 0.44–1.00)
Calcium: 9.8 mg/dL (ref 8.9–10.3)
GFR calc Af Amer: 46 mL/min — ABNORMAL LOW (ref >60)
GFR calc non Af Amer: 40 mL/min — ABNORMAL LOW (ref >60)
GLUCOSE: 135 mg/dL — AB (ref 65–99)
POTASSIUM: 4.5 mmol/L (ref 3.5–5.1)
SODIUM: 138 mmol/L (ref 135–145)

## 2017-12-19 LAB — TROPONIN I: Troponin I: <0.03 ng/mL (ref <0.03)

## 2017-12-19 MED ORDER — MORPHINE SULFATE (PF) 4 MG/ML IV SOLN
4.0000 mg | Freq: Once | INTRAVENOUS | Status: AC
  Administered 2017-12-19: 4 mg via INTRAVENOUS
  Filled 2017-12-19: qty 1

## 2017-12-19 MED ORDER — HYDROMORPHONE HCL 1 MG/ML IJ SOLN
1.0000 mg | Freq: Once | INTRAMUSCULAR | Status: AC
  Administered 2017-12-19: 1 mg via INTRAVENOUS
  Filled 2017-12-19: qty 1

## 2017-12-19 MED ORDER — ONDANSETRON HCL 4 MG/2ML IJ SOLN
4.0000 mg | Freq: Once | INTRAMUSCULAR | Status: AC
  Administered 2017-12-19: 4 mg via INTRAVENOUS
  Filled 2017-12-19: qty 2

## 2017-12-19 MED ORDER — METOCLOPRAMIDE HCL 5 MG/ML IJ SOLN
10.0000 mg | Freq: Once | INTRAMUSCULAR | Status: AC
  Administered 2017-12-19: 10 mg via INTRAVENOUS
  Filled 2017-12-19: qty 2

## 2017-12-19 MED ORDER — ONDANSETRON HCL 4 MG/2ML IJ SOLN
INTRAMUSCULAR | Status: AC
  Filled 2017-12-19: qty 2

## 2017-12-19 MED ORDER — IOPAMIDOL (ISOVUE-300) INJECTION 61%
75.0000 mL | Freq: Once | INTRAVENOUS | Status: AC | PRN
  Administered 2017-12-19: 75 mL via INTRAVENOUS

## 2017-12-19 MED ORDER — ONDANSETRON HCL 4 MG/2ML IJ SOLN
4.0000 mg | Freq: Once | INTRAMUSCULAR | Status: AC
  Administered 2017-12-19: 4 mg via INTRAVENOUS

## 2017-12-19 NOTE — ED Notes (Signed)
Pt to MRI at this time.

## 2017-12-19 NOTE — H&P (Signed)
[facility] [place] NAME: Anna Mcbride    MR#:  [id number]  DATE OF BIRTH:  1946/07/10  DATE OF ADMISSION:  12/19/2017  PRIMARY CARE PHYSICIAN: Baxter Hire, MD   REQUESTING/REFERRING PHYSICIAN: Quentin Cornwall, MD  CHIEF COMPLAINT:   Chief Complaint  Patient presents with  . Chest Pain    HISTORY OF PRESENT ILLNESS:  Anna Mcbride  is a 72 y.o. female who presents with upper abdominal pain that radiates to her back.  This pain has been present since early in the afternoon.  Here in the ED her work-up was largely within normal limits until she had a CT scan done which showed biliary sludge.  Does not show any CBD dilatation.  Pain was initially controlled in the ED with IV pain meds, nausea is somewhat persistent.  Hospitalist were called for admission  PAST MEDICAL HISTORY:   Past Medical History:  Diagnosis Date  . Acute respiratory failure ([place]) 11/29/2014  . 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 Asthma   . Non Hodgkin's lymphoma ([place]) [id number]  . Personal history of chemotherapy      PAST SURGICAL HISTORY:   Past Surgical History:  Procedure Laterality Date  . 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 NASAL SINUS SURGERY  08/07/05 09/09/05   x 2   . VIDEO BRONCHOSCOPY Bilateral 11/30/2014   Procedure: VIDEO BRONCHOSCOPY WITHOUT FLUORO;  Surgeon: Flora Lipps, MD;  Location: [facility] ORS;  Service: Cardiopulmonary;  Laterality: Bilateral;     SOCIAL HISTORY:   Social History   Tobacco Use  . Smoking status: Never Smoker  . Smokeless tobacco: Never Used  Substance Use Topics  . Alcohol use: Yes     FAMILY HISTORY:   Family History  Problem Relation Age of Onset  . Asthma Son   . Congestive Heart Failure Mother   . Diabetes Brother   . Pulmonary embolism Father   . Breast cancer Neg Hx      DRUG ALLERGIES:   Allergies  Allergen Reactions  . Hydrocodone-Homatropine Itching  . Tussionex Pennkinetic Er [Hydrocod  Polst-Cpm Polst Er] Itching  . Ciprofloxacin Rash    MEDICATIONS AT HOME:   Prior to Admission medications   Medication Sig Start Date End Date Taking? Authorizing Provider  albuterol (PROVENTIL HFA;VENTOLIN HFA) 108 (90 BASE) MCG/ACT inhaler Inhale 1 puff into the lungs every 6 (six) hours as needed for wheezing or shortness of breath.    [provider]  albuterol (PROVENTIL) (2.5 MG/3ML) 0.083% nebulizer solution Take 3 mLs (2.5 mg total) by nebulization every 4 (four) hours as needed for wheezing or shortness of breath. 01/06/17   Flora Lipps, MD  ALPRAZolam Duanne Moron) 0.5 MG tablet TAKE 1 TABLET BY MOUTH AT BEDTIME AS NEEDED FOR SLEEP 12/10/16   Flora Lipps, MD  AMBULATORY NON FORMULARY MEDICATION Medication Name: Incentive Spirometer Use 10-15 times daily 07/29/16   Flora Lipps, MD  aspirin EC 81 MG tablet Take 81 mg by mouth daily.    [provider]  budesonide (PULMICORT) 0.5 MG/2ML nebulizer solution Take 2 mLs (0.5 mg total) by nebulization 2 (two) times daily. 01/06/17   Flora Lipps, MD  citalopram (CELEXA) 20 MG tablet TAKE 1 TABLET BY MOUTH ONCE DAILY 12/16/17   Cammie Sickle, MD  formoterol (PERFOROMIST) 20 MCG/2ML nebulizer solution Take 2 mLs (20 mcg total) by nebulization 2 (two) times daily. 01/06/17   Flora Lipps, MD  levofloxacin (LEVAQUIN) 500 MG tablet Take  1 tablet (500 mg total) by mouth daily for 7 days. 12/16/17 12/23/17  Verlon Au, NP  levothyroxine (SYNTHROID, LEVOTHROID) 25 MCG tablet TAKE 1 TABLET BY MOUTH ONCE DAILY ON AN EMPTY STOMACH. WAIT 30 MINUTES BEFORE TAKING OTHER MEDS. 12/03/17   Cammie Sickle, MD  omeprazole (PRILOSEC) 20 MG capsule TAKE 1 CAPSULE BY MOUTH TWICE DAILY BEFORE MEALS 02/03/17   Cammie Sickle, MD  ondansetron (ZOFRAN ODT) 4 MG disintegrating tablet Take 1 tablet (4 mg total) every 8 (eight) hours as needed by mouth for nausea or vomiting. 05/30/17   Hillary Bow, MD  potassium chloride SA  (K-DUR,KLOR-CON) 20 MEQ tablet Take 1 tablet (20 mEq total) by mouth daily. 10/27/17   Cammie Sickle, MD  predniSONE (DELTASONE) 20 MG tablet Take 2 tablets (40 mg total) by mouth daily with breakfast for 7 days. 12/16/17 12/23/17  Verlon Au, NP  Respiratory Therapy Supplies (FLUTTER) DEVI Use 10-15 times daily 07/29/16   Flora Lipps, MD  vitamin C (ASCORBIC ACID) 500 MG tablet Take 500 mg by mouth daily.    [provider]    REVIEW OF SYSTEMS:  Review of Systems  Constitutional: Negative for chills, fever, malaise/fatigue and weight loss.  HENT: Negative for ear pain, hearing loss and tinnitus.   Eyes: Negative for blurred vision, double vision, pain and redness.  Respiratory: Negative for cough, hemoptysis and shortness of breath.   Cardiovascular: Negative for chest pain, palpitations, orthopnea and leg swelling.  Gastrointestinal: Positive for abdominal pain, nausea and vomiting. Negative for constipation and diarrhea.  Genitourinary: Negative for dysuria, frequency and hematuria.  Musculoskeletal: Negative for back pain, joint pain and neck pain.  Skin:       No acne, rash, or lesions  Neurological: Negative for dizziness, tremors, focal weakness and weakness.  Endo/Heme/Allergies: Negative for polydipsia. Does not bruise/bleed easily.  Psychiatric/Behavioral: Negative for depression. The patient is not nervous/anxious and does not have insomnia.      VITAL SIGNS:   Vitals:   12/19/17 1826 12/19/17 1900 12/19/17 2000 12/19/17 2130  BP:  (!) 128/93 (!) 145/97 120/78  Pulse:  67 77 66  Resp:  (!) 22 18 11   Temp:      TempSrc:      SpO2:  100% 100% 99%  Weight: 54.4 kg (120 lb)     Height: 5\' 1"  (1.549 m)      Wt Readings from Last 3 Encounters:  12/19/17 54.4 kg (120 lb)  12/17/17 54.4 kg (120 lb)  12/16/17 54.4 kg (120 lb)    PHYSICAL EXAMINATION:  Physical Exam  Vitals reviewed. Constitutional: She is oriented to person, place, and time. She appears  well-developed and well-nourished. No distress.  HENT:  Head: Normocephalic and atraumatic.  Mouth/Throat: Oropharynx is clear and moist.  Eyes: Pupils are equal, round, and reactive to light. Conjunctivae and EOM are normal. No scleral icterus.  Neck: Normal range of motion. Neck supple. No JVD present. No thyromegaly present.  Cardiovascular: Normal rate, regular rhythm and intact distal pulses. Exam reveals no gallop and no friction rub.  No murmur heard. Respiratory: Effort normal and breath sounds normal. No respiratory distress. She has no wheezes. She has no rales.  GI: Soft. Bowel sounds are normal. She exhibits no distension. There is tenderness.  Musculoskeletal: Normal range of motion. She exhibits no edema.  No arthritis, no gout  Lymphadenopathy:    She has no cervical adenopathy.  Neurological: She is alert and oriented to  person, place, and time. No cranial nerve deficit.  No dysarthria, no aphasia  Skin: Skin is warm and dry. No rash noted. No erythema.  Psychiatric: She has a normal mood and affect. Her behavior is normal. Judgment and thought content normal.    LABORATORY PANEL:   CBC Recent Labs  Lab 12/19/17 1827  WBC 9.4  HGB 13.2  HCT 39.2  PLT 221   ------------------------------------------------------------------------------------------------------------------  Chemistries  Recent Labs  Lab 12/19/17 1827  NA 138  K 4.5  CL 103  CO2 22  GLUCOSE 135*  BUN 40*  CREATININE 1.29*  CALCIUM 9.8   ------------------------------------------------------------------------------------------------------------------  Cardiac Enzymes Recent Labs  Lab 12/19/17 1827  TROPONINI <0.03   ------------------------------------------------------------------------------------------------------------------  RADIOLOGY:  Ct Angio Chest Pe W Or Wo Contrast  Result Date: 12/18/2017 CLINICAL DATA:  Lymphoma.  Worsening breathing. EXAM: CT ANGIOGRAPHY CHEST WITH  CONTRAST TECHNIQUE: Multidetector CT imaging of the chest was performed using the standard protocol during bolus administration of intravenous contrast. Multiplanar CT image reconstructions and MIPs were obtained to evaluate the vascular anatomy. CONTRAST:  41mL ISOVUE-370 IOPAMIDOL (ISOVUE-370) INJECTION 76% COMPARISON:  06/18/2016 FINDINGS: Cardiovascular: Preferential opacification of the thoracic aorta. No evidence of thoracic aortic aneurysm or dissection. Normal heart size. No pericardial effusion. No pericardial effusion. Mediastinum/Nodes: Small scattered mediastinal nodes. Stable left subclavian Port-A-Cath device. Lungs/Pleura: Chronic pleural and parenchymal changes at the right apex are stable. The previously visualized right pneumothorax has resolved. Patchy ground-glass opacities throughout the left lung have markedly improved. Bronchiectasis in the lingula is present and not significantly changed. No new dominant pulmonary parenchymal mass. Upper Abdomen: Central right lobe liver cyst is stable. Musculoskeletal: No vertebral compression deformity. There is a healing fracture of the sternum with deformity. Review of the MIP images confirms the above findings. IMPRESSION: No evidence of acute pulmonary thromboembolism. Narrowing of the left subclavian and innominate arteries as described. Stable pleural and parenchymal changes at the right apex related to the patient's known lymphoma. Improved ground-glass opacities throughout the left lung is supporting inflammatory etiology. Aortic Atherosclerosis (ICD10-I70.0). Electronically Signed   By: Marybelle Killings M.D.   On: 12/18/2017 14:08   Ct Abdomen Pelvis W Contrast  Result Date: 12/19/2017 CLINICAL DATA:  Nausea and vomiting. EXAM: CT ABDOMEN AND PELVIS WITH CONTRAST TECHNIQUE: Multidetector CT imaging of the abdomen and pelvis was performed using the standard protocol following bolus administration of intravenous contrast. CONTRAST:  54mL ISOVUE-300  IOPAMIDOL (ISOVUE-300) INJECTION 61% COMPARISON:  February 18, 2017 FINDINGS: Lower chest: No acute abnormality. Hepatobiliary: Periportal edema seen in the liver. A hepatic cyst is identified. High attenuation material seen in the gallbladder. The portal vein is patent. Pancreas: Unremarkable. No pancreatic ductal dilatation or surrounding inflammatory changes. Spleen: Normal in size without focal abnormality. Adrenals/Urinary Tract: Adrenal glands are normal. Parapelvic cysts on the left. The kidneys are otherwise unremarkable. Contrast in the bladder is likely due to the CT of the chest from yesterday. No ureteral stones or obstruction identified. Stomach/Bowel: The stomach and small bowel are normal. Colonic diverticulosis is seen without diverticulitis. The cecum lies deep within the pelvis. The appendix is normal. Fecal loading identified. Vascular/Lymphatic: Atherosclerotic changes are seen in the nonaneurysmal aorta. No adenopathy. Reproductive: Uterus and bilateral adnexa are unremarkable. Other: No abdominal wall hernia or abnormality. No abdominopelvic ascites. Musculoskeletal: No acute or significant osseous findings. IMPRESSION: 1. No acute abnormalities identified. 2. Periportal edema in the liver is nonspecific. 3. High attenuation the gallbladder could represent sludge or stones. No wall  thickening. 4. Colonic diverticulosis without diverticulitis. 5. Atherosclerotic changes in the nonaneurysmal aorta. Electronically Signed   By: Dorise Bullion III M.D   On: 12/19/2017 21:27   Dg Chest Portable 1 View  Result Date: 12/19/2017 CLINICAL DATA:  Chest pain EXAM: PORTABLE CHEST 1 VIEW COMPARISON:  12/16/17 FINDINGS: Cardiac shadow is stable. Left chest wall port is noted. Chronic changes in the apices are again seen. No focal infiltrate or sizable effusion is noted. The previously seen changes on the left are not well appreciated on this exam. No acute bony abnormality is noted. IMPRESSION: No acute  abnormality seen. Chronic changes in the apices are again seen. Electronically Signed   By: Inez Catalina M.D.   On: 12/19/2017 18:58    EKG:   Orders placed or performed during the hospital encounter of 12/19/17  . ED EKG within 10 minutes  . ED EKG within 10 minutes    IMPRESSION AND PLAN:  Principal Problem:   Biliary sludge -suspect this may be contributing or the cause of her abdominal pain nausea vomiting.  Will use PRN analgesia and antiemetics tonight to control her symptoms, IV fluids for hydration, and get a GI consult in the morning Active Problems:   RAD (reactive airway disease) -home dose inhalers   HLD (hyperlipidemia) -Home dose antilipid   Adult hypothyroidism -home dose thyroid replacement   GERD (gastroesophageal reflux disease) -home dose H2 blocker   CKD (chronic kidney disease), stage III (HCC) -baseline, avoid nephrotoxins and monitor  Chart review performed and case discussed with ED provider. Labs, imaging and/or ECG reviewed by provider and discussed with patient/family. Management plans discussed with the patient and/or family.  DVT PROPHYLAXIS: SubQ lovenox  GI PROPHYLAXIS: H2 blocker  ADMISSION STATUS: Inpatient  CODE STATUS: Full Code Status History    Date Active Date Inactive Code Status Order ID Comments User Context   05/29/2017 1740 05/30/2017 1649 Full Code [id number]  Henreitta Leber, MD Inpatient   11/26/2016 1844 11/29/2016 1513 Full Code [id number]  Henreitta Leber, MD Inpatient   09/12/2016 0223 09/13/2016 1756 DNR [id number]  Harrie Foreman, MD ED   06/18/2016 1821 06/20/2016 1535 DNR [id number]  Vaughan Basta, MD Inpatient   11/29/2014 1405 12/05/2014 1550 DNR [id number]  Evlyn Kanner, NP Inpatient      TOTAL TIME TAKING CARE OF THIS PATIENT: 45 minutes.   Jannifer Franklin, Deyani Hegarty [place] 12/19/2017, 11:41 PM  [facility]  Office  [phone]  CC: Primary care physician; Baxter Hire, MD  Note:  This  document was prepared using Dragon voice recognition software and may include unintentional dictation errors.

## 2017-12-19 NOTE — ED Provider Notes (Signed)
[facility] Emergency Department Provider Note ____________________________________________   First MD Initiated Contact with Patient 12/19/17 1834     (approximate)  I have reviewed the triage vital signs and the nursing notes.   HISTORY  Chief Complaint Chest Pain    HPI [name] is a 72 y.o. female with PMH as noted below including lymphoma who presents with chest pain, occurring over the last several days, but worse today, radiating to the right side of her neck and to her back, and associated with nausea and vomiting.  It has not been relieved by Aleve that she took earlier, a also not relieved by aspirin given by EMS.  Past Medical History:  Diagnosis Date  . Acute respiratory failure ([place]) 11/29/2014  . 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 Asthma   . Non Hodgkin's lymphoma ([place]) [id number]  . Personal history of chemotherapy     Patient Active Problem List   Diagnosis Date Noted  . Chronic respiratory failure with hypoxia, on home O2 therapy ([place]) 12/16/2017  . Sepsis ([place]) 09/12/2016  . Pneumothorax   . 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 Recurrent upper respiratory infection (URI) 09/01/2015  . Acute respiratory failure with hypoxia ([place])   . 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 SOB (shortness of breath)   . Pneumonia   . Acute respiratory failure ([place]) 11/29/2014  . History of digestive disease 11/29/2014  . HLD (hyperlipidemia) 11/29/2014  . Adult hypothyroidism 11/29/2014  . Lymphoma ([place]) 11/29/2014  . RAD (reactive airway disease) 11/29/2014  . 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 Pain in shoulder 11/01/2014  . Fracture of humerus, proximal 11/01/2014  . Febrile neutropenia ([place]) 11/08/2013  . Decreased potassium in the blood 09/02/2013  . Acquired hypothyroidism 07/01/2013  . Lymphoma, non-Hodgkin's ([place]) 06/30/2013  . Cervical lymphadenopathy 04/20/2013  .  History of lymphoma 04/20/2013  . Dyspnea 01/29/2011  . Cough 01/29/2011    Past Surgical History:  Procedure Laterality Date  . 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 NASAL SINUS SURGERY  08/07/05 09/09/05   x 2   . VIDEO BRONCHOSCOPY Bilateral 11/30/2014   Procedure: VIDEO BRONCHOSCOPY WITHOUT FLUORO;  Surgeon: Flora Lipps, MD;  Location: [facility] ORS;  Service: Cardiopulmonary;  Laterality: Bilateral;    Prior to Admission medications   Medication Sig Start Date End Date Taking? Authorizing Provider  albuterol (PROVENTIL HFA;VENTOLIN HFA) 108 (90 BASE) MCG/ACT inhaler Inhale 1 puff into the lungs every 6 (six) hours as needed for wheezing or shortness of breath.    [provider]  albuterol (PROVENTIL) (2.5 MG/3ML) 0.083% nebulizer solution Take 3 mLs (2.5 mg total) by nebulization every 4 (four) hours as needed for wheezing or shortness of breath. 01/06/17   Flora Lipps, MD  ALPRAZolam Duanne Moron) 0.5 MG tablet TAKE 1 TABLET BY MOUTH AT BEDTIME AS NEEDED FOR SLEEP 12/10/16   Flora Lipps, MD  AMBULATORY NON FORMULARY MEDICATION Medication Name: Incentive Spirometer Use 10-15 times daily 07/29/16   Flora Lipps, MD  aspirin EC 81 MG tablet Take 81 mg by mouth daily.    [provider]  budesonide (PULMICORT) 0.5 MG/2ML nebulizer solution Take 2 mLs (0.5 mg total) by nebulization 2 (two) times daily. 01/06/17   Flora Lipps, MD  citalopram (CELEXA) 20 MG tablet TAKE 1 TABLET BY MOUTH ONCE DAILY 12/16/17   Cammie Sickle, MD  formoterol (PERFOROMIST) 20 MCG/2ML nebulizer solution Take 2 mLs (20  mcg total) by nebulization 2 (two) times daily. 01/06/17   Flora Lipps, MD  levofloxacin (LEVAQUIN) 500 MG tablet Take 1 tablet (500 mg total) by mouth daily for 7 days. 12/16/17 12/23/17  Verlon Au, NP  levothyroxine (SYNTHROID, LEVOTHROID) 25 MCG tablet TAKE 1 TABLET BY MOUTH ONCE DAILY ON AN EMPTY STOMACH. WAIT 30 MINUTES BEFORE TAKING OTHER MEDS. 12/03/17   Cammie Sickle, MD    omeprazole (PRILOSEC) 20 MG capsule TAKE 1 CAPSULE BY MOUTH TWICE DAILY BEFORE MEALS 02/03/17   Cammie Sickle, MD  ondansetron (ZOFRAN ODT) 4 MG disintegrating tablet Take 1 tablet (4 mg total) every 8 (eight) hours as needed by mouth for nausea or vomiting. 05/30/17   Hillary Bow, MD  potassium chloride SA (K-DUR,KLOR-CON) 20 MEQ tablet Take 1 tablet (20 mEq total) by mouth daily. 10/27/17   Cammie Sickle, MD  predniSONE (DELTASONE) 20 MG tablet Take 2 tablets (40 mg total) by mouth daily with breakfast for 7 days. 12/16/17 12/23/17  Verlon Au, NP  Respiratory Therapy Supplies (FLUTTER) DEVI Use 10-15 times daily 07/29/16   Flora Lipps, MD  vitamin C (ASCORBIC ACID) 500 MG tablet Take 500 mg by mouth daily.    [provider]    Allergies Hydrocodone-homatropine; Tussionex pennkinetic er [employer] polst-cpm polst er]; and Ciprofloxacin  Family History  Problem Relation Age of Onset  . Asthma Son   . Congestive Heart Failure Mother   . Diabetes Brother   . Pulmonary embolism Father   . Breast cancer Neg Hx     Social History Social History   Tobacco Use  . Smoking status: Never Smoker  . Smokeless tobacco: Never Used  Substance Use Topics  . Alcohol use: Yes  . Drug use: No    Review of Systems  Constitutional: No fever. Eyes: No redness. ENT: Positive for neck pain. Cardiovascular: Positive for chest pain. Respiratory: Denies shortness of breath. Gastrointestinal: Positive for nausea and vomiting. Genitourinary: Negative for flank pain.  Musculoskeletal: Positive for back pain. Skin: Negative for rash. Neurological: Negative for headache.   ____________________________________________   PHYSICAL EXAM:  VITAL SIGNS: ED Triage Vitals  Enc Vitals Group     BP 12/19/17 1824 132/73     Pulse Rate 12/19/17 1824 74     Resp 12/19/17 1824 18     Temp 12/19/17 1824 98 F (36.7 C)     Temp Source 12/19/17 1824 Oral     SpO2 12/19/17 1824  100 %     Weight 12/19/17 1826 120 lb (54.4 kg)     Height 12/19/17 1826 5\' 1"  (1.549 m)     Head Circumference --      Peak Flow --      Pain Score 12/19/17 1825 8     Pain Loc --      Pain Edu? --      Excl. in [place]? --     Constitutional: Alert and oriented.  Uncomfortable appearing but in no acute distress. Eyes: Conjunctivae are normal.  Head: Atraumatic. Nose: No congestion/rhinnorhea. Mouth/Throat: Mucous membranes are somewhat dry.   Neck: Normal range of motion.  Cardiovascular: Normal rate, regular rhythm. Grossly normal heart sounds.  Good peripheral circulation. Respiratory: Normal respiratory effort.  No retractions. Lungs CTAB. Gastrointestinal: Soft and nontender. No distention.  Genitourinary: No flank tenderness. Musculoskeletal: No lower extremity edema.  Extremities warm and well perfused.  Neurologic:  Normal speech and language. No gross focal neurologic deficits are appreciated.  Skin:  Skin is warm and dry. No rash noted. Psychiatric: Mood and affect are normal. Speech and behavior are normal.  ____________________________________________   LABS (all labs ordered are listed, but only abnormal results are displayed)  Labs Reviewed  BASIC METABOLIC PANEL - Abnormal; Notable for the following components:      Result Value   Glucose, Bld 135 (*)    BUN 40 (*)    Creatinine, Ser 1.29 (*)    GFR calc non Af Amer 40 (*)    GFR calc Af Amer 46 (*)    All other components within normal limits  CBC  TROPONIN I   ____________________________________________  EKG  ED ECG REPORT I, Arta Silence, the attending physician, personally viewed and interpreted this ECG.  Date: 12/19/2017 EKG Time: 1825 Rate: 70 Rhythm: normal sinus rhythm QRS Axis: normal Intervals: Nonspecific IVCD ST/T Wave abnormalities: Nonspecific diffuse ST abnormalities Narrative Interpretation: no evidence of acute ischemia; no significant change when compared to EKG of  04/08/2017  ____________________________________________  RADIOLOGY  CXR: No acute findings  CT abd: pending  ____________________________________________   PROCEDURES  Procedure(s) performed: No  Procedures  Critical Care performed: No ____________________________________________   INITIAL IMPRESSION / ASSESSMENT AND PLAN / ED COURSE  Pertinent labs & imaging results that were available during my care of the patient were reviewed by me and considered in my medical decision making (see chart for details).  72 year old female with history of lymphoma and other PMH as noted above presents with chest pain over the last several days, but acutely worse today, and associated with nausea and vomiting.  I reviewed the past medical records in epic; was seen by her pulmonologist on 5/29 and diagnosed with dyspnea and respiratory failure, with concern for acute bronchitis or pneumonia.  She was placed on prednisone and antibiotics.  She had an elevated d-dimer, with CT performed yesterday which was negative for acute PE.  There were no other significant acute findings.  On exam today, the vital signs are normal, but the patient is very uncomfortable appearing and tearful.  The remainder of the exam is as described above.  Differential includes persistent bronchitis or other pulmonary process, ACS, pain related to the lymphoma, musculoskeletal pain, or gastritis/GERD.  Plan: Chest x-ray, basic and cardiac labs, symptomatic treatment, and reassess.  There is no indication for repeat CT chest today.  I anticipate that the patient will likely need admission.  ----------------------------------------- 8:53 PM on 12/19/2017 -----------------------------------------  Patient's lab work-up has been unremarkable, troponin is negative, and her chest x-ray shows no significant acute findings.  However, the patient continues to have refractory nausea and vomiting as well as some pain.  Although  she does not really have any focal tenderness in the abdomen, given the cancer history.  As well as her refractory nausea and vomiting after multiple doses of medication, I am somewhat concerned for an intra-abdominal process.  I will obtain a CT of the abdomen.  If it is negative and the patient's symptoms continue, we will plan for likely admission.  I am signing the patient out to the oncoming physician Dr. Quentin Cornwall ____________________________________________   FINAL CLINICAL IMPRESSION(S) / ED DIAGNOSES  Final diagnoses:  Chest pain, unspecified type  Intractable vomiting with nausea, unspecified vomiting type      NEW MEDICATIONS STARTED DURING THIS VISIT:  New Prescriptions   No medications on file     Note:  This document was prepared using Dragon voice recognition software and may include unintentional dictation  errors.    Arta Silence, MD 12/19/17 2056

## 2017-12-19 NOTE — ED Triage Notes (Signed)
Pt arrived from home with sudden onset of pain that started in her right neck and radiated to her chest. Pt took 2 aleve prior to calling ems and received 4mg  of zofran and 324 of asa in route.

## 2017-12-20 ENCOUNTER — Other Ambulatory Visit: Payer: Self-pay

## 2017-12-20 DIAGNOSIS — R1011 Right upper quadrant pain: Secondary | ICD-10-CM | POA: Diagnosis not present

## 2017-12-20 DIAGNOSIS — K838 Other specified diseases of biliary tract: Secondary | ICD-10-CM | POA: Diagnosis not present

## 2017-12-20 DIAGNOSIS — Z7982 Long term (current) use of aspirin: Secondary | ICD-10-CM | POA: Diagnosis not present

## 2017-12-20 DIAGNOSIS — J449 Chronic obstructive pulmonary disease, unspecified: Secondary | ICD-10-CM | POA: Diagnosis not present

## 2017-12-20 DIAGNOSIS — Z8572 Personal history of non-Hodgkin lymphomas: Secondary | ICD-10-CM | POA: Diagnosis not present

## 2017-12-20 DIAGNOSIS — J9610 Chronic respiratory failure, unspecified whether with hypoxia or hypercapnia: Secondary | ICD-10-CM | POA: Diagnosis not present

## 2017-12-20 DIAGNOSIS — R079 Chest pain, unspecified: Secondary | ICD-10-CM | POA: Diagnosis present

## 2017-12-20 DIAGNOSIS — Z923 Personal history of irradiation: Secondary | ICD-10-CM | POA: Diagnosis not present

## 2017-12-20 DIAGNOSIS — Z9484 Stem cells transplant status: Secondary | ICD-10-CM | POA: Diagnosis not present

## 2017-12-20 DIAGNOSIS — Z7951 Long term (current) use of inhaled steroids: Secondary | ICD-10-CM | POA: Diagnosis not present

## 2017-12-20 DIAGNOSIS — N183 Chronic kidney disease, stage 3 (moderate): Secondary | ICD-10-CM | POA: Diagnosis not present

## 2017-12-20 DIAGNOSIS — K219 Gastro-esophageal reflux disease without esophagitis: Secondary | ICD-10-CM | POA: Diagnosis not present

## 2017-12-20 DIAGNOSIS — E785 Hyperlipidemia, unspecified: Secondary | ICD-10-CM | POA: Diagnosis not present

## 2017-12-20 DIAGNOSIS — Z9221 Personal history of antineoplastic chemotherapy: Secondary | ICD-10-CM | POA: Diagnosis not present

## 2017-12-20 DIAGNOSIS — E039 Hypothyroidism, unspecified: Secondary | ICD-10-CM | POA: Diagnosis not present

## 2017-12-20 DIAGNOSIS — Z9981 Dependence on supplemental oxygen: Secondary | ICD-10-CM | POA: Diagnosis not present

## 2017-12-20 DIAGNOSIS — J84112 Idiopathic pulmonary fibrosis: Secondary | ICD-10-CM | POA: Diagnosis not present

## 2017-12-20 DIAGNOSIS — R112 Nausea with vomiting, unspecified: Secondary | ICD-10-CM | POA: Diagnosis not present

## 2017-12-20 LAB — BASIC METABOLIC PANEL
ANION GAP: 9 (ref 5–15)
BUN: 51 mg/dL — ABNORMAL HIGH (ref 6–20)
CHLORIDE: 106 mmol/L (ref 101–111)
CO2: 25 mmol/L (ref 22–32)
CREATININE: 1.45 mg/dL — AB (ref 0.44–1.00)
Calcium: 9.5 mg/dL (ref 8.9–10.3)
GFR calc non Af Amer: 35 mL/min — ABNORMAL LOW (ref >60)
GFR, EST AFRICAN AMERICAN: 40 mL/min — AB (ref >60)
Glucose, Bld: 117 mg/dL — ABNORMAL HIGH (ref 65–99)
Potassium: 4.3 mmol/L (ref 3.5–5.1)
SODIUM: 140 mmol/L (ref 135–145)

## 2017-12-20 LAB — CBC
HCT: 38.1 % (ref 35.0–47.0)
HEMOGLOBIN: 13.1 g/dL (ref 12.0–16.0)
MCH: 34.3 pg — ABNORMAL HIGH (ref 26.0–34.0)
MCHC: 34.3 g/dL (ref 32.0–36.0)
MCV: 99.8 fL (ref 80.0–100.0)
Platelets: 192 10*3/uL (ref 150–440)
RBC: 3.81 MIL/uL (ref 3.80–5.20)
RDW: 14.1 % (ref 11.5–14.5)
WBC: 7 10*3/uL (ref 3.6–11.0)

## 2017-12-20 LAB — LIPASE, BLOOD: Lipase: 40 U/L (ref 11–51)

## 2017-12-20 LAB — HEPATIC FUNCTION PANEL
ALK PHOS: 57 U/L (ref 38–126)
ALT: 26 U/L (ref 14–54)
AST: 85 U/L — AB (ref 15–41)
Albumin: 3.9 g/dL (ref 3.5–5.0)
TOTAL PROTEIN: 7.5 g/dL (ref 6.5–8.1)
Total Bilirubin: 0.6 mg/dL (ref 0.3–1.2)

## 2017-12-20 MED ORDER — ARFORMOTEROL TARTRATE 15 MCG/2ML IN NEBU
15.0000 ug | INHALATION_SOLUTION | Freq: Two times a day (BID) | RESPIRATORY_TRACT | Status: DC
  Administered 2017-12-20 – 2017-12-21 (×3): 15 ug via RESPIRATORY_TRACT
  Filled 2017-12-20 (×4): qty 2

## 2017-12-20 MED ORDER — ONDANSETRON HCL 4 MG PO TABS: 4.0000 mg | ORAL_TABLET | Freq: Four times a day (QID) | ORAL | Status: DC | PRN

## 2017-12-20 MED ORDER — PROMETHAZINE HCL 25 MG/ML IJ SOLN
12.5000 mg | Freq: Four times a day (QID) | INTRAMUSCULAR | Status: DC | PRN
Start: 2017-12-20 — End: 2017-12-21
  Administered 2017-12-20: 12.5 mg via INTRAVENOUS
  Filled 2017-12-20: qty 1

## 2017-12-20 MED ORDER — PANTOPRAZOLE SODIUM 40 MG PO TBEC
40.0000 mg | DELAYED_RELEASE_TABLET | Freq: Every day | ORAL | Status: DC
  Administered 2017-12-20 – 2017-12-21 (×2): 40 mg via ORAL
  Filled 2017-12-20 (×2): qty 1

## 2017-12-20 MED ORDER — ENOXAPARIN SODIUM 30 MG/0.3ML ~~LOC~~ SOLN
30.0000 mg | SUBCUTANEOUS | Status: DC
  Administered 2017-12-20 – 2017-12-21 (×2): 30 mg via SUBCUTANEOUS
  Filled 2017-12-20 (×2): qty 0.3

## 2017-12-20 MED ORDER — CITALOPRAM HYDROBROMIDE 20 MG PO TABS
20.0000 mg | ORAL_TABLET | Freq: Every day | ORAL | Status: DC
  Administered 2017-12-20 – 2017-12-21 (×2): 20 mg via ORAL
  Filled 2017-12-20 (×2): qty 1

## 2017-12-20 MED ORDER — ONDANSETRON HCL 4 MG/2ML IJ SOLN
4.0000 mg | Freq: Four times a day (QID) | INTRAMUSCULAR | Status: DC | PRN
  Administered 2017-12-20: 4 mg via INTRAVENOUS
  Filled 2017-12-20: qty 2

## 2017-12-20 MED ORDER — OXYCODONE HCL 5 MG PO TABS
5.0000 mg | ORAL_TABLET | ORAL | Status: DC | PRN
  Administered 2017-12-20 – 2017-12-21 (×3): 5 mg via ORAL
  Filled 2017-12-20 (×3): qty 1

## 2017-12-20 MED ORDER — ASPIRIN EC 81 MG PO TBEC
81.0000 mg | DELAYED_RELEASE_TABLET | Freq: Every day | ORAL | Status: DC
  Administered 2017-12-20 – 2017-12-21 (×2): 81 mg via ORAL
  Filled 2017-12-20 (×2): qty 1

## 2017-12-20 MED ORDER — ORAL CARE MOUTH RINSE
15.0000 mL | Freq: Two times a day (BID) | OROMUCOSAL | Status: DC
  Administered 2017-12-20 – 2017-12-21 (×3): 15 mL via OROMUCOSAL

## 2017-12-20 MED ORDER — ALPRAZOLAM 0.5 MG PO TABS: 0.5000 mg | ORAL_TABLET | Freq: Every evening | ORAL | Status: DC | PRN

## 2017-12-20 MED ORDER — ACETAMINOPHEN 325 MG PO TABS
650.0000 mg | ORAL_TABLET | Freq: Four times a day (QID) | ORAL | Status: DC | PRN
Start: 2017-12-20 — End: 2017-12-21

## 2017-12-20 MED ORDER — ACETAMINOPHEN 650 MG RE SUPP
650.0000 mg | Freq: Four times a day (QID) | RECTAL | Status: DC | PRN
Start: 2017-12-20 — End: 2017-12-21

## 2017-12-20 MED ORDER — BUDESONIDE 0.5 MG/2ML IN SUSP
0.5000 mg | Freq: Two times a day (BID) | RESPIRATORY_TRACT | Status: DC
  Administered 2017-12-20 – 2017-12-21 (×3): 0.5 mg via RESPIRATORY_TRACT
  Filled 2017-12-20 (×2): qty 2

## 2017-12-20 MED ORDER — HYDROMORPHONE HCL 1 MG/ML IJ SOLN: 0.5000 mg | INTRAMUSCULAR | Status: DC | PRN

## 2017-12-20 MED ORDER — LEVOTHYROXINE SODIUM 50 MCG PO TABS
25.0000 ug | ORAL_TABLET | Freq: Every day | ORAL | Status: DC
  Administered 2017-12-20 – 2017-12-21 (×2): 25 ug via ORAL
  Filled 2017-12-20: qty 0.5
  Filled 2017-12-20 (×2): qty 1

## 2017-12-20 NOTE — ED Notes (Signed)
This RN called floor for report and floor explained delay is due to need for clarification of bed placement from admitting.

## 2017-12-20 NOTE — Progress Notes (Signed)
PHARMACIST - PHYSICIAN COMMUNICATION  CONCERNING:  Enoxaparin (Lovenox) for DVT Prophylaxis   RECOMMENDATION: Patient was prescribed enoxaprin 40mg  q24 hours for VTE prophylaxis.   Filed Weights   12/19/17 1826 12/20/17 0116  Weight: 120 lb (54.4 kg) 116 lb 2.9 oz (52.7 kg)    Body mass index is 21.95 kg/m.  Estimated Creatinine Clearance: 28.9 mL/min (A) (by C-G formula based on SCr of 1.29 mg/dL (H)).  Patient is candidate for enoxaparin 30mg  every 24 hours based on CrCl <65ml/min.  DESCRIPTION: Pharmacy has adjusted enoxaparin dose.  Patient is now receiving enoxaparin 30mg  every 24 hours.  Pernell Dupre, PharmD, BCPS Clinical Pharmacist 12/20/2017 1:41 AM

## 2017-12-20 NOTE — Consult Note (Signed)
Anna Darby, MD [address]  [place]  [place], [place] [address]  Main: [phone]  Fax: [phone] Pager: [phone]   Consultation  Referring Provider:     No ref. provider found Primary Care Physician:  Baxter Hire, MD Primary Gastroenterologist:  Dr. Sherri Sear         Reason for Consultation:     Biliary sludge, abdominal pain  Date of Admission:  12/19/2017 Date of Consultation:  12/20/2017         HPI:   Anna Mcbride is a 72 y.o. female with history of non-Hodgkin's lymphoma, status post  Stem cell transplantation, recurrence of lymphoma in 2015, underwent chemotherapy XRT, currently in remission presents with 1 day history of sudden onset of right neck pain, right-sided chest pain and shoulder pain as well as upper abdominal tightness, associated with nausea. She reports that this pain started yesterday afternoon and had 1 episode of emesis last night. She reports feeling significantly better today. Initially, she underwent CT PE protocol as her d-dimer was elevated. There is no evidence of pulmonary embolism. She subsequently underwent CT A/P and was found to have biliary sludge/cholelithiasis. CBD with normal caliber. LFTs today revealed mildly elevated AST, otherwise normal alkaline phosphatase and total bilirubin. Patient reports that this was the worst pain in her life. 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 Patient denies constipation or diarrhea. Reports having one formed bowel movement daily.  NSAIDs: none  Antiplts/Anticoagulants/Anti thrombotics: none  GI Procedures: none  Past Medical History:  Diagnosis Date  . Acute respiratory failure ([place]) 11/29/2014  . 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 Asthma   . Non Hodgkin's lymphoma ([place]) [id number]  . Personal history of chemotherapy     Past Surgical History:  Procedure Laterality Date  . 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 NASAL SINUS SURGERY   08/07/05 09/09/05   x 2   . VIDEO BRONCHOSCOPY Bilateral 11/30/2014   Procedure: VIDEO BRONCHOSCOPY WITHOUT FLUORO;  Surgeon: Flora Lipps, MD;  Location: [facility] ORS;  Service: Cardiopulmonary;  Laterality: Bilateral;    Prior to Admission medications   Medication Sig Start Date End Date Taking? Authorizing Provider  albuterol (PROVENTIL HFA;VENTOLIN HFA) 108 (90 BASE) MCG/ACT inhaler Inhale 1 puff into the lungs every 6 (six) hours as needed for wheezing or shortness of breath.   Yes [provider]  albuterol (PROVENTIL) (2.5 MG/3ML) 0.083% nebulizer solution Take 3 mLs (2.5 mg total) by nebulization every 4 (four) hours as needed for wheezing or shortness of breath. 01/06/17  Yes Kasa, Maretta Bees, MD  ALPRAZolam Duanne Moron) 0.5 MG tablet TAKE 1 TABLET BY MOUTH AT BEDTIME AS NEEDED FOR SLEEP 12/10/16  Yes Flora Lipps, MD  aspirin EC 81 MG tablet Take 81 mg by mouth daily.   Yes [provider]  budesonide (PULMICORT) 0.5 MG/2ML nebulizer solution Take 2 mLs (0.5 mg total) by nebulization 2 (two) times daily. 01/06/17  Yes Kasa, Maretta Bees, MD  citalopram (CELEXA) 20 MG tablet TAKE 1 TABLET BY MOUTH ONCE DAILY 12/16/17  Yes Cammie Sickle, MD  formoterol (PERFOROMIST) 20 MCG/2ML nebulizer solution Take 2 mLs (20 mcg total) by nebulization 2 (two) times daily. 01/06/17  Yes Flora Lipps, MD  levofloxacin (LEVAQUIN) 500 MG tablet Take 1 tablet (500 mg total) by mouth daily for 7 days. 12/16/17 12/23/17 Yes Verlon Au, NP  levothyroxine (SYNTHROID, LEVOTHROID) 25 MCG tablet TAKE 1 TABLET BY MOUTH ONCE DAILY ON  AN EMPTY STOMACH. WAIT 30 MINUTES BEFORE TAKING OTHER MEDS. 12/03/17  Yes Cammie Sickle, MD  omeprazole (PRILOSEC) 20 MG capsule TAKE 1 CAPSULE BY MOUTH TWICE DAILY BEFORE MEALS 02/03/17  Yes Cammie Sickle, MD  ondansetron (ZOFRAN ODT) 4 MG disintegrating tablet Take 1 tablet (4 mg total) every 8 (eight) hours as needed by mouth for nausea or vomiting. 05/30/17  Yes Sudini,  Alveta Heimlich, MD  potassium chloride SA (K-DUR,KLOR-CON) 20 MEQ tablet Take 1 tablet (20 mEq total) by mouth daily. 10/27/17  Yes Cammie Sickle, MD  predniSONE (DELTASONE) 20 MG tablet Take 2 tablets (40 mg total) by mouth daily with breakfast for 7 days. 12/16/17 12/23/17 Yes Verlon Au, NP  vitamin C (ASCORBIC ACID) 500 MG tablet Take 500 mg by mouth daily.   Yes [provider]  AMBULATORY NON FORMULARY MEDICATION Medication Name: Incentive Spirometer Use 10-15 times daily 07/29/16   Flora Lipps, MD  Respiratory Therapy Supplies (FLUTTER) DEVI Use 10-15 times daily 07/29/16   Flora Lipps, MD    Family History  Problem Relation Age of Onset  . Asthma Son   . Congestive Heart Failure Mother   . Diabetes Brother   . Pulmonary embolism Father   . Breast cancer Neg Hx      Social History   Tobacco Use  . Smoking status: Never Smoker  . Smokeless tobacco: Never Used  Substance Use Topics  . Alcohol use: Yes  . Drug use: No    Allergies as of 12/19/2017 - Review Complete 12/19/2017  Allergen Reaction Noted  . Hydrocodone-homatropine Itching 12/21/2014  . Tussionex pennkinetic er [hydrocod polst-cpm polst er] Itching 04/20/2013  . Ciprofloxacin Rash 01/29/2011    Review of Systems:    All systems reviewed and negative except where noted in HPI.   Physical Exam:  Vital signs in last 24 hours: Temp:  [96.4 F (35.8 C)-98 F (36.7 C)] 96.4 F (35.8 C) (06/01 0623) Pulse Rate:  [56-77] 56 (06/01 0423) Resp:  [9-22] 20 (06/01 0423) BP: (114-145)/(57-97) 134/57 (06/01 0423) SpO2:  [97 %-100 %] 98 % (06/01 0705) Weight:  [116 lb 2.9 oz (52.7 kg)-120 lb (54.4 kg)] 116 lb 2.9 oz (52.7 kg) (06/01 0116)   General:   Pleasant, cooperative in NAD, thin built Head:  Normocephalic and atraumatic. Eyes:   No icterus.   Conjunctiva pink. PERRLA. Ears:  Normal auditory acuity. Neck:  Supple; no masses or thyroidomegaly Lungs: Respirations even and unlabored. Lungs clear to  auscultation bilaterally.   No wheezes, crackles, or rhonchi.  Heart:  Regular rate and rhythm;  Without murmur, clicks, rubs or gallops Abdomen:  Soft, nondistended, generalized voluntary guarding, nontender. Normal bowel sounds. No appreciable masses or hepatomegaly.  No rebound or guarding.  Rectal:  Not performed. Msk:  Symmetrical without gross deformities.  Strength normal  Extremities:  Without edema, cyanosis or clubbing. Neurologic:  Alert and oriented x3;  grossly normal neurologically. Skin:  Intact without significant lesions or rashes. Psych:  Alert and cooperative. Normal affect.  LAB RESULTS: CBC Latest Ref Rng & Units 12/20/2017 12/19/2017 10/27/2017  WBC 3.6 - 11.0 K/uL 7.0 9.4 4.5  Hemoglobin 12.0 - 16.0 g/dL 13.1 13.2 13.0  Hematocrit 35.0 - 47.0 % 38.1 39.2 38.2  Platelets 150 - 440 K/uL 192 221 165    BMET BMP Latest Ref Rng & Units 12/20/2017 12/19/2017 12/18/2017  Glucose 65 - 99 mg/dL 117(H) 135(H) -  BUN 6 - 20 mg/dL 51(H) 40(H) -  Creatinine 0.44 - 1.00 mg/dL 1.45(H) 1.29(H) 1.30(H)  Sodium 135 - 145 mmol/L 140 138 -  Potassium 3.5 - 5.1 mmol/L 4.3 4.5 -  Chloride 101 - 111 mmol/L 106 103 -  CO2 22 - 32 mmol/L 25 22 -  Calcium 8.9 - 10.3 mg/dL 9.5 9.8 -    LFT Hepatic Function Latest Ref Rng & Units 12/20/2017 10/27/2017 10/03/2017  Total Protein 6.5 - 8.1 g/dL 7.5 6.5 6.8  Albumin 3.5 - 5.0 g/dL 3.9 3.9 3.7  AST 15 - 41 U/L 85(H) 18 19  ALT 14 - 54 U/L '26 17 14  '$ Alk Phosphatase 38 - 126 U/L 57 49 48  Total Bilirubin 0.3 - 1.2 mg/dL 0.6 0.6 1.0  Bilirubin, Direct 0.1 - 0.5 mg/dL <0.1(L) - -     STUDIES: Ct Abdomen Pelvis W Contrast  Result Date: 12/19/2017 CLINICAL DATA:  Nausea and vomiting. EXAM: CT ABDOMEN AND PELVIS WITH CONTRAST TECHNIQUE: Multidetector CT imaging of the abdomen and pelvis was performed using the standard protocol following bolus administration of intravenous contrast. CONTRAST:  70m ISOVUE-300 IOPAMIDOL (ISOVUE-300) INJECTION 61%  COMPARISON:  February 18, 2017 FINDINGS: Lower chest: No acute abnormality. Hepatobiliary: Periportal edema seen in the liver. A hepatic cyst is identified. High attenuation material seen in the gallbladder. The portal vein is patent. Pancreas: Unremarkable. No pancreatic ductal dilatation or surrounding inflammatory changes. Spleen: Normal in size without focal abnormality. Adrenals/Urinary Tract: Adrenal glands are normal. Parapelvic cysts on the left. The kidneys are otherwise unremarkable. Contrast in the bladder is likely due to the CT of the chest from yesterday. No ureteral stones or obstruction identified. Stomach/Bowel: The stomach and small bowel are normal. Colonic diverticulosis is seen without diverticulitis. The cecum lies deep within the pelvis. The appendix is normal. Fecal loading identified. Vascular/Lymphatic: Atherosclerotic changes are seen in the nonaneurysmal aorta. No adenopathy. Reproductive: Uterus and bilateral adnexa are unremarkable. Other: No abdominal wall hernia or abnormality. No abdominopelvic ascites. Musculoskeletal: No acute or significant osseous findings. IMPRESSION: 1. No acute abnormalities identified. 2. Periportal edema in the liver is nonspecific. 3. High attenuation the gallbladder could represent sludge or stones. No wall thickening. 4. Colonic diverticulosis without diverticulitis. 5. Atherosclerotic changes in the nonaneurysmal aorta. Electronically Signed   By: DDorise BullionIII M.D   On: 12/19/2017 21:27   Dg Chest Portable 1 View  Result Date: 12/19/2017 CLINICAL DATA:  Chest pain EXAM: PORTABLE CHEST 1 VIEW COMPARISON:  12/16/17 FINDINGS: Cardiac shadow is stable. Left chest wall port is noted. Chronic changes in the apices are again seen. No focal infiltrate or sizable effusion is noted. The previously seen changes on the left are not well appreciated on this exam. No acute bony abnormality is noted. IMPRESSION: No acute abnormality seen. Chronic changes in the  apices are again seen. Electronically Signed   By: MInez CatalinaM.D.   On: 12/19/2017 18:58      Impression / Plan:   Anna WITCHERis a 72y.o. Caucasian female with history of non-Hodgkin's lymphoma, currently in remission, restrictive lung disease presents with 1 day history of sudden onset of right-sided neck pain, back pain and chest pain associated with upper abdominal tightness. CT A/P revealed biliary sludge and therefore GI is consulted for further management. Her LFTs revealed mildly elevated AST only. There is no evidence of gallstone pancreatitis or bile duct obstruction. She has atypical presentation for acute cholecystitis or biliary colic although it is a possibility.  Recommend Gen. Surgery consult for  further evaluation.   Thank you for involving me in the care of this patient. We will sign off, please call us back with questions     LOS: 1 day   Sherri Sear, MD  12/20/2017, 4:42 PM   Note: This dictation was prepared with Dragon dictation along with smaller phrase technology. Any transcriptional errors that result from this process are unintentional.

## 2017-12-20 NOTE — Progress Notes (Signed)
Patient temp 96.27F rectal and cool to touch. Patient states she is usually very cold at home and sometimes hot. Patient states she does not feel cool at the moment. Notified Dr Marcille Blanco. No new orders given. Will continue to monitor patient.

## 2017-12-20 NOTE — ED Notes (Signed)
Floor assignment changed at this time.

## 2017-12-20 NOTE — Progress Notes (Addendum)
[facility] [place] NAME: Anna Mcbride    MR#:  [id number]  DATE OF BIRTH:  12-25-1945  SUBJECTIVE: Patient is seen at bedside, admitted for abdominal pain, nausea, found to have biliary sludge by CAT scan of abdomen.  Patient has no abdominal pain today did not eat much.  CHIEF COMPLAINT:   Chief Complaint  Patient presents with  . Chest Pain    REVIEW OF SYSTEMS:   ROS CONSTITUTIONAL: No fever, fatigue or weakness.  EYES: No blurred or double vision.  EARS, NOSE, AND THROAT: No tinnitus or ear pain.  RESPIRATORY: No cough, shortness of breath, wheezing or hemoptysis.  CARDIOVASCULAR: No chest pain, orthopnea, edema.  GASTROINTESTINAL: No nausea, vomiting, diarrhea or abdominal pain.  GENITOURINARY: No dysuria, hematuria.  ENDOCRINE: No polyuria, nocturia,  HEMATOLOGY: No anemia, easy bruising or bleeding SKIN: No rash or lesion. MUSCULOSKELETAL: No joint pain or arthritis.   NEUROLOGIC: No tingling, numbness, weakness.  PSYCHIATRY: No anxiety or depression.   DRUG ALLERGIES:   Allergies  Allergen Reactions  . Hydrocodone-Homatropine Itching  . Tussionex Pennkinetic Er [Hydrocod Polst-Cpm Polst Er] Itching  . Ciprofloxacin Rash    VITALS:  Blood pressure (!) 134/57, pulse (!) 56, temperature (!) 96.4 F (35.8 C), temperature source Rectal, resp. rate 20, height 5\' 1"  (1.549 m), weight 52.7 kg (116 lb 2.9 oz), SpO2 98 %.  PHYSICAL EXAMINATION:  GENERAL:  72 y.o.-year-old patient lying in the bed with no acute distress.  EYES: Pupils equal, round, reactive to light and accommodation. No scleral icterus. Extraocular muscles intact.  HEENT: Head atraumatic, normocephalic. Oropharynx and nasopharynx clear.  NECK:  Supple, no jugular venous distention. No thyroid enlargement, no tenderness.  LUNGS: Normal breath sounds bilaterally, no wheezing, rales,rhonchi or crepitation. No use of accessory muscles of respiration.   CARDIOVASCULAR: S1, S2 normal. No murmurs, rubs, or gallops.  ABDOMEN: Soft, nontender, nondistended. Bowel sounds present. No organomegaly or mass.  EXTREMITIES: No pedal edema, cyanosis, or clubbing.  NEUROLOGIC: Cranial nerves II through XII are intact. Muscle strength 5/5 in all extremities. Sensation intact. Gait not checked.  PSYCHIATRIC: The patient is alert and oriented x 3.  SKIN: No obvious rash, lesion, or ulcer.    LABORATORY PANEL:   CBC Recent Labs  Lab 12/20/17 0608  WBC 7.0  HGB 13.1  HCT 38.1  PLT 192   ------------------------------------------------------------------------------------------------------------------  Chemistries  Recent Labs  Lab 12/20/17 0608  NA 140  K 4.3  CL 106  CO2 25  GLUCOSE 117*  BUN 51*  CREATININE 1.45*  CALCIUM 9.5   ------------------------------------------------------------------------------------------------------------------  Cardiac Enzymes Recent Labs  Lab 12/19/17 1827  TROPONINI <0.03   ------------------------------------------------------------------------------------------------------------------  RADIOLOGY:  Ct Angio Chest Pe W Or Wo Contrast  Result Date: 12/18/2017 CLINICAL DATA:  Lymphoma.  Worsening breathing. EXAM: CT ANGIOGRAPHY CHEST WITH CONTRAST TECHNIQUE: Multidetector CT imaging of the chest was performed using the standard protocol during bolus administration of intravenous contrast. Multiplanar CT image reconstructions and MIPs were obtained to evaluate the vascular anatomy. CONTRAST:  56mL ISOVUE-370 IOPAMIDOL (ISOVUE-370) INJECTION 76% COMPARISON:  06/18/2016 FINDINGS: Cardiovascular: Preferential opacification of the thoracic aorta. No evidence of thoracic aortic aneurysm or dissection. Normal heart size. No pericardial effusion. No pericardial effusion. Mediastinum/Nodes: Small scattered mediastinal nodes. Stable left subclavian Port-A-Cath device. Lungs/Pleura: Chronic pleural and parenchymal  changes at the right apex are stable. The previously visualized right pneumothorax has resolved. Patchy ground-glass opacities throughout the left lung have markedly improved.  Bronchiectasis in the lingula is present and not significantly changed. No new dominant pulmonary parenchymal mass. Upper Abdomen: Central right lobe liver cyst is stable. Musculoskeletal: No vertebral compression deformity. There is a healing fracture of the sternum with deformity. Review of the MIP images confirms the above findings. IMPRESSION: No evidence of acute pulmonary thromboembolism. Narrowing of the left subclavian and innominate arteries as described. Stable pleural and parenchymal changes at the right apex related to the patient's known lymphoma. Improved ground-glass opacities throughout the left lung is supporting inflammatory etiology. Aortic Atherosclerosis (ICD10-I70.0). Electronically Signed   By: Marybelle Killings M.D.   On: 12/18/2017 14:08   Ct Abdomen Pelvis W Contrast  Result Date: 12/19/2017 CLINICAL DATA:  Nausea and vomiting. EXAM: CT ABDOMEN AND PELVIS WITH CONTRAST TECHNIQUE: Multidetector CT imaging of the abdomen and pelvis was performed using the standard protocol following bolus administration of intravenous contrast. CONTRAST:  40mL ISOVUE-300 IOPAMIDOL (ISOVUE-300) INJECTION 61% COMPARISON:  February 18, 2017 FINDINGS: Lower chest: No acute abnormality. Hepatobiliary: Periportal edema seen in the liver. A hepatic cyst is identified. High attenuation material seen in the gallbladder. The portal vein is patent. Pancreas: Unremarkable. No pancreatic ductal dilatation or surrounding inflammatory changes. Spleen: Normal in size without focal abnormality. Adrenals/Urinary Tract: Adrenal glands are normal. Parapelvic cysts on the left. The kidneys are otherwise unremarkable. Contrast in the bladder is likely due to the CT of the chest from yesterday. No ureteral stones or obstruction identified. Stomach/Bowel: The  stomach and small bowel are normal. Colonic diverticulosis is seen without diverticulitis. The cecum lies deep within the pelvis. The appendix is normal. Fecal loading identified. Vascular/Lymphatic: Atherosclerotic changes are seen in the nonaneurysmal aorta. No adenopathy. Reproductive: Uterus and bilateral adnexa are unremarkable. Other: No abdominal wall hernia or abnormality. No abdominopelvic ascites. Musculoskeletal: No acute or significant osseous findings. IMPRESSION: 1. No acute abnormalities identified. 2. Periportal edema in the liver is nonspecific. 3. High attenuation the gallbladder could represent sludge or stones. No wall thickening. 4. Colonic diverticulosis without diverticulitis. 5. Atherosclerotic changes in the nonaneurysmal aorta. Electronically Signed   By: Dorise Bullion III M.D   On: 12/19/2017 21:27   Dg Chest Portable 1 View  Result Date: 12/19/2017 CLINICAL DATA:  Chest pain EXAM: PORTABLE CHEST 1 VIEW COMPARISON:  12/16/17 FINDINGS: Cardiac shadow is stable. Left chest wall port is noted. Chronic changes in the apices are again seen. No focal infiltrate or sizable effusion is noted. The previously seen changes on the left are not well appreciated on this exam. No acute bony abnormality is noted. IMPRESSION: No acute abnormality seen. Chronic changes in the apices are again seen. Electronically Signed   By: Inez Catalina M.D.   On: 12/19/2017 18:58    EKG:   Orders placed or performed during the hospital encounter of 12/19/17  . ED EKG within 10 minutes  . ED EKG within 10 minutes    ASSESSMENT AND PLAN:   72 year old female patient with history of COPD, chronic respiratory failure, history of  high-grade B-cell lymphoma status post chemotherapy, stem cell transplant in 2015 followed by Dr. Rogue Bussing for surveillance comes in because of abdominal pain, nausea.  Patient is  IV Dilaudid 0.5 mg every 4 hours as needed for severe pain p.o. Percocet.,  Pharmacist she is on  Reglan, Zofran.  add LFT to the lab. 1.  Right upper quadrant abdominal pain, nausea secondary to biliary sludge.  Patient symptoms are improved now, will wait for gastroenterology input.  Likely discharge  home tomorrow. 2.  History of COPD, chronic respiratory failure: Recently treated with the Levaquin for bronchitis.  #3 CKD stage III: Patient GFR around 49.  Slight worsening of creatinine today: Patient not on nephrotoxic agents.  Monitor closely.  disCussed with husband, patient's son. All the records are reviewed and case discussed with Care Management/Social Workerr. Management plans discussed with the patient, family and they are in agreement.  CODE STATUS: full TOTAL TIME TAKING CARE OF THIS PATIENT:35 minutes.   POSSIBLE D/C IN 1DAYS, DEPENDING ON CLINICAL CONDITION.   Epifanio Lesches M.D on 12/20/2017 at 12:59 PM  Between 7am to 6pm - Pager - [phone]  After 6pm go to [email or web address] - password [email or web address] [facility]  Office  [phone]   CC: Primary care physician; Baxter Hire, MD   Note: This dictation was prepared with Dragon dictation along with smaller phrase technology. Any transcriptional errors that result from this process are unintentional.

## 2017-12-21 DIAGNOSIS — K838 Other specified diseases of biliary tract: Secondary | ICD-10-CM | POA: Diagnosis not present

## 2017-12-21 DIAGNOSIS — R1013 Epigastric pain: Secondary | ICD-10-CM | POA: Diagnosis not present

## 2017-12-21 DIAGNOSIS — R079 Chest pain, unspecified: Secondary | ICD-10-CM | POA: Diagnosis not present

## 2017-12-21 NOTE — Consult Note (Addendum)
SURGICAL CONSULTATION NOTE (initial) - cpt: 17510  HISTORY OF PRESENT ILLNESS (HPI):  72 y.o. female presented to [facility] ED 3 days ago (Friday) for evaluation of abdominal pain. Patient's husband reports patient first developed neck and back pain early last week, for which she took Alleve, 1 day after which she began to complain of mid-upper > LUQ abdominal pain. Patient and her husband describe patient occasionally experiences GERD despite taking Prilosec once daily, her symptoms of which are worse when she often eats citrus fruits. She denies dysphasia and denies post-prandial RUQ or epigastric abdominal pain following meats, cheeses/dairy, or friend foods in particular. Patient currently says her pain has much improved with only mild epigastric abdominal pain remaining, and she denies any N/V, fever/chills, or CP.  Surgery is consulted by medical physician Dr. Barron Alvine in this context for evaluation and management of possible symptomatic cholelithiasis.  PAST MEDICAL HISTORY (PMH):  Past Medical History:  Diagnosis Date  . Acute respiratory failure ([place]) 11/29/2014  . 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 Asthma   . Non Hodgkin's lymphoma ([place]) [id number]  . Personal history of chemotherapy      PAST SURGICAL HISTORY ([place]):  Past Surgical History:  Procedure Laterality Date  . 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 NASAL SINUS SURGERY  08/07/05 09/09/05   x 2   . VIDEO BRONCHOSCOPY Bilateral 11/30/2014   Procedure: VIDEO BRONCHOSCOPY WITHOUT FLUORO;  Surgeon: Flora Lipps, MD;  Location: [facility] ORS;  Service: Cardiopulmonary;  Laterality: Bilateral;     MEDICATIONS:  Prior to Admission medications   Medication Sig Start Date End Date Taking? Authorizing Provider  albuterol (PROVENTIL HFA;VENTOLIN HFA) 108 (90 BASE) MCG/ACT inhaler Inhale 1 puff into the lungs every 6 (six) hours as needed for wheezing or shortness of breath.   Yes [provider]  albuterol (PROVENTIL) (2.5  MG/3ML) 0.083% nebulizer solution Take 3 mLs (2.5 mg total) by nebulization every 4 (four) hours as needed for wheezing or shortness of breath. 01/06/17  Yes Kasa, Maretta Bees, MD  ALPRAZolam Duanne Moron) 0.5 MG tablet TAKE 1 TABLET BY MOUTH AT BEDTIME AS NEEDED FOR SLEEP 12/10/16  Yes Flora Lipps, MD  aspirin EC 81 MG tablet Take 81 mg by mouth daily.   Yes [provider]  budesonide (PULMICORT) 0.5 MG/2ML nebulizer solution Take 2 mLs (0.5 mg total) by nebulization 2 (two) times daily. 01/06/17  Yes Kasa, Maretta Bees, MD  citalopram (CELEXA) 20 MG tablet TAKE 1 TABLET BY MOUTH ONCE DAILY 12/16/17  Yes Cammie Sickle, MD  formoterol (PERFOROMIST) 20 MCG/2ML nebulizer solution Take 2 mLs (20 mcg total) by nebulization 2 (two) times daily. 01/06/17  Yes Flora Lipps, MD  levofloxacin (LEVAQUIN) 500 MG tablet Take 1 tablet (500 mg total) by mouth daily for 7 days. 12/16/17 12/23/17 Yes Verlon Au, NP  levothyroxine (SYNTHROID, LEVOTHROID) 25 MCG tablet TAKE 1 TABLET BY MOUTH ONCE DAILY ON AN EMPTY STOMACH. WAIT 30 MINUTES BEFORE TAKING OTHER MEDS. 12/03/17  Yes Cammie Sickle, MD  omeprazole (PRILOSEC) 20 MG capsule TAKE 1 CAPSULE BY MOUTH TWICE DAILY BEFORE MEALS 02/03/17  Yes Cammie Sickle, MD  ondansetron (ZOFRAN ODT) 4 MG disintegrating tablet Take 1 tablet (4 mg total) every 8 (eight) hours as needed by mouth for nausea or vomiting. 05/30/17  Yes Sudini, Alveta Heimlich, MD  potassium chloride SA (K-DUR,KLOR-CON) 20 MEQ tablet Take 1 tablet (20 mEq total) by mouth daily. 10/27/17  Yes Cammie Sickle, MD  predniSONE (DELTASONE) 20 MG tablet  Take 2 tablets (40 mg total) by mouth daily with breakfast for 7 days. 12/16/17 12/23/17 Yes Verlon Au, NP  vitamin C (ASCORBIC ACID) 500 MG tablet Take 500 mg by mouth daily.   Yes [provider]  AMBULATORY NON FORMULARY MEDICATION Medication Name: Incentive Spirometer Use 10-15 times daily 07/29/16   Flora Lipps, MD  Respiratory Therapy  Supplies (FLUTTER) DEVI Use 10-15 times daily 07/29/16   Flora Lipps, MD     ALLERGIES:  Allergies  Allergen Reactions  . Hydrocodone-Homatropine Itching  . Tussionex Pennkinetic Er [Hydrocod Polst-Cpm Polst Er] Itching  . Ciprofloxacin Rash     SOCIAL HISTORY:  Social History   Socioeconomic History  . Marital status: Married    Spouse name: Not on file  . Number of children: 2  . Years of education: Not on file  . Highest education level: Not on file  Occupational History  . Occupation: Retired    Comment: Tour manager  . Financial resource strain: Not on file  . Food insecurity:    Worry: Not on file    Inability: Not on file  . Transportation needs:    Medical: Not on file    Non-medical: Not on file  Tobacco Use  . Smoking status: Never Smoker  . Smokeless tobacco: Never Used  Substance and Sexual Activity  . Alcohol use: Yes  . Drug use: No  . Sexual activity: Not on file  Lifestyle  . Physical activity:    Days per week: Not on file    Minutes per session: Not on file  . Stress: Not on file  Relationships  . Social connections:    Talks on phone: Not on file    Gets together: Not on file    Attends religious service: Not on file    Active member of club or organization: Not on file    Attends meetings of clubs or organizations: Not on file    Relationship status: Not on file  . Intimate partner violence:    Fear of current or ex partner: Not on file    Emotionally abused: Not on file    Physically abused: Not on file    Forced sexual activity: Not on file  Other Topics Concern  . Not on file  Social History Narrative  . Not on file    The patient currently resides (home / rehab facility / nursing home): Home The patient normally is (ambulatory / bedbound): Limited ambulation secondary to post-chemoradiation pulmonary fibrosis   FAMILY HISTORY:  Family History  Problem Relation Age of Onset  . Asthma Son   . Congestive Heart Failure  Mother   . Diabetes Brother   . Pulmonary embolism Father   . Breast cancer Neg Hx      REVIEW OF SYSTEMS:  Constitutional: denies weight loss, fever, chills, or sweats  Eyes: denies any other vision changes, history of eye injury  ENT: denies sore throat, hearing problems  Respiratory: denies shortness of breath, wheezing  Cardiovascular: denies chest pain, palpitations  Gastrointestinal: abdominal pain, N/V, and bowel function as per HPI Genitourinary: denies burning with urination or urinary frequency Musculoskeletal: denies any other joint pains or cramps  Skin: denies any other rashes or skin discolorations  Neurological: denies any other headache, dizziness, weakness  Psychiatric: denies any other depression, anxiety   All other review of systems were negative   VITAL SIGNS:  Temp:  [97.9 F (36.6 C)-99.6 F (37.6 C)] 99.6 F (37.6  C) (06/02 0427) Pulse Rate:  [78-86] 86 (06/02 0427) Resp:  [20] 20 (06/02 0427) BP: (103-114)/(70-80) 114/80 (06/02 0427) SpO2:  [95 %-100 %] 95 % (06/02 0754)     Height: 5\' 1"  (154.9 cm) Weight: 116 lb 2.9 oz (52.7 kg) BMI (Calculated): 21.96   INTAKE/OUTPUT:  This shift: No intake/output data recorded.  Last 2 shifts: @IOLAST2SHIFTS @   PHYSICAL EXAM:  Constitutional:  -- Normal body habitus  -- Awake, alert, and oriented x3, no apparent distress Eyes:  -- Pupils equally round and reactive to light  -- No scleral icterus, B/L no occular discharge Ear, nose, throat: -- Neck is FROM WNL -- No jugular venous distension  Pulmonary:  -- No wheezes or rhales -- Equal breath sounds bilaterally -- Breathing mildly labored at rest despite supplemental oxygen via nasal canula Cardiovascular:  -- S1, S2 present  -- No pericardial rubs  Gastrointestinal:  -- Abdomen soft and non-distended with mild epigastric > LUQ tenderness to palpation (RUQ completely NT), no guarding or rebound tenderness -- No abdominal masses appreciated, pulsatile  or otherwise  Musculoskeletal and Integumentary:  -- Wounds or skin discoloration: None appreciated -- Extremities: B/L UE and LE FROM, hands and feet warm, no edema  Neurologic:  -- Motor function: Intact and symmetric -- Sensation: Intact and symmetric Psychiatric:  -- Mood and affect WNL  Labs:  CBC Latest Ref Rng & Units 12/20/2017 12/19/2017 10/27/2017  WBC 3.6 - 11.0 K/uL 7.0 9.4 4.5  Hemoglobin 12.0 - 16.0 g/dL 13.1 13.2 13.0  Hematocrit 35.0 - 47.0 % 38.1 39.2 38.2  Platelets 150 - 440 K/uL 192 221 165   CMP Latest Ref Rng & Units 12/20/2017 12/19/2017 12/18/2017  Glucose 65 - 99 mg/dL 117(H) 135(H) -  BUN 6 - 20 mg/dL 51(H) 40(H) -  Creatinine 0.44 - 1.00 mg/dL 1.45(H) 1.29(H) 1.30(H)  Sodium 135 - 145 mmol/L 140 138 -  Potassium 3.5 - 5.1 mmol/L 4.3 4.5 -  Chloride 101 - 111 mmol/L 106 103 -  CO2 22 - 32 mmol/L 25 22 -  Calcium 8.9 - 10.3 mg/dL 9.5 9.8 -  Total Protein 6.5 - 8.1 g/dL 7.5 - -  Total Bilirubin 0.3 - 1.2 mg/dL 0.6 - -  Alkaline Phos 38 - 126 U/L 57 - -  AST 15 - 41 U/L 85(H) - -  ALT 14 - 54 U/L 26 - -   Lipase (12/20/2017): 40  Imaging studies:  CT Abdomen and Pelvis with Contrast (12/19/2017) - personally reviewed and discussed with patient, her husband, and medical team 1. No acute abnormalities identified. 2. Periportal edema in the liver is nonspecific. 3. High attenuation the gallbladder could represent sludge or stones. No wall thickening. 4. Colonic diverticulosis without diverticulitis. 5. Atherosclerotic changes in the nonaneurysmal aorta.  Assessment/Plan: (ICD-10's: R10.13) 72 y.o. 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   - advance diet as tolerated  - monitor abdominal exam and bowel function  while inpatient  - discussed with patient and her husband dietary factors associated with biliary and gastric etiologies for epigastric abdominal pain   - no indication for surgical intervention at this time for patient unlikely to be a surgical candidate with husband stating he would not want surgery for her  - no further gallbladder testing indicated at this time, medical management of  comorbidities and disposition as per medical team  - does not require surgical follow-up, but please call if questions/concerns  - DVT prophylaxis  All of the above findings and recommendations were discussed with the patient, her husband, patient's RN, and medical physician, and all of patient's and her family's questions were answered to their expressed satisfaction.  Thank you for the opportunity to participate in this patient's care.   -- Marilynne Drivers Rosana Hoes, MD, [place]: [facility] - Partnering for exceptional care. Office: [phone]

## 2017-12-21 NOTE — Progress Notes (Signed)
IV and tele were removed. Discharge instructions, follow-up appointments, and prescriptions were provided to the pt.  All questions answered. The pt was taken downstairs via wheelchair by NT.

## 2017-12-21 NOTE — Progress Notes (Signed)
Patient has no abdominal pain, nausea, tolerating the diet.  Husband mentioned that she has church members coming tomorrow during lunchtime and wanted her to go home if possible.  Patient denies any complaints, eating well.  Patient can have follow-up with Dr. Tama High as an outpatient for possible HIDA scan for biliary sludge.  Discharge instructions on the computer.

## 2017-12-23 LAB — HEPATITIS PANEL, ACUTE
HCV Ab: <0.1 s/co ratio (ref 0.0–0.9)
Hep A IgM: NEGATIVE
Hep B C IgM: NEGATIVE
Hepatitis B Surface Ag: NEGATIVE

## 2017-12-24 ENCOUNTER — Telehealth: Payer: Self-pay

## 2017-12-24 NOTE — Telephone Encounter (Signed)
Flagged on EMMI report for not having a follow up appointment scheduled.  Called and spoke with patient.  She mentioned she has a follow up scheduled with her PCP tomorrow (12/25/17).  No other questions or concerns at this time.  I thanked her for her time and informed her she would receive one more automated call in the next few days as a final check-in.

## 2017-12-24 NOTE — Discharge Summary (Signed)
Anna Mcbride, is a 72 y.o. female  DOB 04-21-1946  MRN [id number].  Admission date:  12/19/2017  Admitting Physician  Lance Coon, MD  Discharge Date:  12/21/2017   Primary MD  Baxter Hire, MD  Recommendations for primary care physician for things to follow:   Follow with PCP in 1 week   Admission Diagnosis  Chest pain, unspecified type [R07.9] Intractable vomiting with nausea, unspecified vomiting type [R11.2]   Discharge Diagnosis  Chest pain, unspecified type [R07.9] Intractable vomiting with nausea, unspecified vomiting type [R11.2]    Principal Problem:   Biliary sludge Active Problems:   HLD (hyperlipidemia)   Adult hypothyroidism   RAD (reactive airway disease)   GERD (gastroesophageal reflux disease)   CKD (chronic kidney disease), stage III (HCC)      Past Medical History:  Diagnosis Date  . Acute respiratory failure ([place]) 11/29/2014  . 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 Asthma   . Non Hodgkin's lymphoma ([place]) [id number]  . Personal history of chemotherapy     Past Surgical History:  Procedure Laterality Date  . 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 NASAL SINUS SURGERY  08/07/05 09/09/05   x 2   . VIDEO BRONCHOSCOPY Bilateral 11/30/2014   Procedure: VIDEO BRONCHOSCOPY WITHOUT FLUORO;  Surgeon: Flora Lipps, MD;  Location: [facility] ORS;  Service: Cardiopulmonary;  Laterality: Bilateral;       History of present illness and  Hospital Course:     Kindly see H&P for history of present illness and admission details, please review complete Labs, Consult reports and Test reports for all details in brief  HPI  from the history and physical done on the day of admission 72 year old female patient with history of non-Hodgkin's lymphoma, chronic respiratory failure on oxygen at home came in because of  abdominal pain, nausea, patient found to have biliary sludge on the CTand she is admitted for observation for the same.   Hospital Course  #1 abdominal pain, nausea, patient initially complained of right upper quadrant pain emergency room but upon entering the floor patient abdominal pain, nausea improved received IV fluids, IV nausea medicines, IV PPIs.  CT abdomen showed biliary sludge but no obstructing stones.  Patient LFTs are largely normal.  Patient seen by gastroenterology, surgery.  Gastroenterology did not recommend any invasive procedures, patient did not have biliary pancreatitis.  He referred the patient to surgery.  Patient seen by Dr. Rosana Hoes from surgery, he recommended no further surgical work-up.  Patient especially remained asymptomatic and tolerated the diet.  Patient is a poor historian, husband mentions that her abdominal pain is worse after eating Septra for suggesting of reflux etiology.  Dr. Rosana Hoes from surgery did not recommend any surgical work-up because patient tolerated the diet and without abdominal pain or nausea.  Patient discharged home in stable condition.  Advised the patient and husband to continue antireflux measures. 2.  History of idiopathic pulmonary fibrosis, chronic respiratory failure: On oxygen at home. 3.  History of high-grade B-cell lymphoma status post chemotherapy, stem cell transplant, patient followed by Dr. Rogue Bussing.     Discharge Condition: Stable   Follow UP  Follow-up Information    Baxter Hire, MD. Schedule an appointment as soon as possible for a visit in 1 week(s).   Specialty:  Internal Medicine Contact information: [place] [address] [phone]        Vickie Epley, MD Follow up in 1 week(s).   Specialty:  General Surgery Why:  Husband can make appointment to see Dr. Rosana Hoes in 1 week Contact information: [address] [place] [address] [phone]              Discharge Instructions  and  Discharge Medications     Allergies as of 12/21/2017      Reactions   Hydrocodone-homatropine Itching   Tussionex Pennkinetic Er [hydrocod Polst-cpm Polst Er] Itching   Ciprofloxacin Rash      Medication List    STOP taking these medications   levofloxacin 500 MG tablet Commonly known as:  LEVAQUIN     TAKE these medications   albuterol 108 (90 Base) MCG/ACT inhaler Commonly known as:  PROVENTIL HFA;VENTOLIN HFA Inhale 1 puff into the lungs every 6 (six) hours as needed for wheezing or shortness of breath.   albuterol (2.5 MG/3ML) 0.083% nebulizer solution Commonly known as:  PROVENTIL Take 3 mLs (2.5 mg total) by nebulization every 4 (four) hours as needed for wheezing or shortness of breath.   ALPRAZolam 0.5 MG tablet Commonly known as:  XANAX TAKE 1 TABLET BY MOUTH AT BEDTIME AS NEEDED FOR SLEEP   AMBULATORY NON FORMULARY MEDICATION Medication Name: Incentive Spirometer Use 10-15 times daily   aspirin EC 81 MG tablet Take 81 mg by mouth daily.   budesonide 0.5 MG/2ML nebulizer solution Commonly known as:  PULMICORT Take 2 mLs (0.5 mg total) by nebulization 2 (two) times daily.   citalopram 20 MG tablet Commonly known as:  CELEXA TAKE 1 TABLET BY MOUTH ONCE DAILY   FLUTTER Devi Use 10-15 times daily   formoterol 20 MCG/2ML nebulizer solution Commonly known as:  PERFOROMIST Take 2 mLs (20 mcg total) by nebulization 2 (two) times daily.   levothyroxine 25 MCG tablet Commonly known as:  SYNTHROID, LEVOTHROID TAKE 1 TABLET BY MOUTH ONCE DAILY ON AN EMPTY STOMACH. WAIT 30 MINUTES BEFORE TAKING OTHER MEDS.   omeprazole 20 MG capsule Commonly known as:  PRILOSEC TAKE 1 CAPSULE BY MOUTH TWICE DAILY BEFORE MEALS   ondansetron 4 MG disintegrating tablet Commonly known as:  ZOFRAN ODT Take 1 tablet (4 mg total) every 8 (eight) hours as needed by mouth for nausea or vomiting.   potassium chloride SA 20 MEQ tablet Commonly known  as:  K-DUR,KLOR-CON Take 1 tablet (20 mEq total) by mouth daily.   vitamin C 500 MG tablet Commonly known as:  ASCORBIC ACID Take 500 mg by mouth daily.     ASK your doctor about these medications   predniSONE 20 MG tablet Commonly known as:  DELTASONE Take 2 tablets (40 mg total) by mouth daily with breakfast for 7 days. Ask about: Should I take this medication?         Diet and Activity recommendation: See Discharge Instructions above   Consults obtained -surgery, gastroenterology   Major procedures and Radiology Reports - PLEASE review detailed and final reports for all details, in brief -     Dg Chest 2 View  Result Date: 12/17/2017 CLINICAL DATA:  Shortness of breath. History of interstitial lung disease and non-Hodgkin's lymphoma EXAM: CHEST - 2 VIEW COMPARISON:  May 29, 2017 and Nov 26, 2016 FINDINGS: There is fibrosis in both upper lobes with scarring in the right upper lobe. There is also scarring in the right base region. There is moderate volume loss on the right. There is subtle increased opacity in the left mid lung compared to prior studies, concerning for potential early pneumonia in this  area. No other new opacity evident. Heart size is normal. There is retraction in both hilar regions with distortion of pulmonary vascularity bilaterally, stable. No adenopathy is evident by radiography. Port-A-Cath tip is in the superior vena cava. No pneumothorax. No blastic or lytic bone lesions. There is an old healed fracture of the sternum. IMPRESSION: Subtle increased opacity in the left mid lung which may represent early pneumonia. There is increased opacity in this area compared to prior studies. Elsewhere there is extensive scarring, more severe on the right than on the left, which is stable. Moderate volume loss on the left. Stable cardiac silhouette. No adenopathy is appreciable on this study. Electronically Signed   By: Lowella Grip III M.D.   On: 12/17/2017 08:33    Ct Angio Chest Pe W Or Wo Contrast  Result Date: 12/18/2017 CLINICAL DATA:  Lymphoma.  Worsening breathing. EXAM: CT ANGIOGRAPHY CHEST WITH CONTRAST TECHNIQUE: Multidetector CT imaging of the chest was performed using the standard protocol during bolus administration of intravenous contrast. Multiplanar CT image reconstructions and MIPs were obtained to evaluate the vascular anatomy. CONTRAST:  62mL ISOVUE-370 IOPAMIDOL (ISOVUE-370) INJECTION 76% COMPARISON:  06/18/2016 FINDINGS: Cardiovascular: Preferential opacification of the thoracic aorta. No evidence of thoracic aortic aneurysm or dissection. Normal heart size. No pericardial effusion. No pericardial effusion. Mediastinum/Nodes: Small scattered mediastinal nodes. Stable left subclavian Port-A-Cath device. Lungs/Pleura: Chronic pleural and parenchymal changes at the right apex are stable. The previously visualized right pneumothorax has resolved. Patchy ground-glass opacities throughout the left lung have markedly improved. Bronchiectasis in the lingula is present and not significantly changed. No new dominant pulmonary parenchymal mass. Upper Abdomen: Central right lobe liver cyst is stable. Musculoskeletal: No vertebral compression deformity. There is a healing fracture of the sternum with deformity. Review of the MIP images confirms the above findings. IMPRESSION: No evidence of acute pulmonary thromboembolism. Narrowing of the left subclavian and innominate arteries as described. Stable pleural and parenchymal changes at the right apex related to the patient's known lymphoma. Improved ground-glass opacities throughout the left lung is supporting inflammatory etiology. Aortic Atherosclerosis (ICD10-I70.0). Electronically Signed   By: Marybelle Killings M.D.   On: 12/18/2017 14:08   Ct Abdomen Pelvis W Contrast  Result Date: 12/19/2017 CLINICAL DATA:  Nausea and vomiting. EXAM: CT ABDOMEN AND PELVIS WITH CONTRAST TECHNIQUE: Multidetector CT imaging of the  abdomen and pelvis was performed using the standard protocol following bolus administration of intravenous contrast. CONTRAST:  9mL ISOVUE-300 IOPAMIDOL (ISOVUE-300) INJECTION 61% COMPARISON:  February 18, 2017 FINDINGS: Lower chest: No acute abnormality. Hepatobiliary: Periportal edema seen in the liver. A hepatic cyst is identified. High attenuation material seen in the gallbladder. The portal vein is patent. Pancreas: Unremarkable. No pancreatic ductal dilatation or surrounding inflammatory changes. Spleen: Normal in size without focal abnormality. Adrenals/Urinary Tract: Adrenal glands are normal. Parapelvic cysts on the left. The kidneys are otherwise unremarkable. Contrast in the bladder is likely due to the CT of the chest from yesterday. No ureteral stones or obstruction identified. Stomach/Bowel: The stomach and small bowel are normal. Colonic diverticulosis is seen without diverticulitis. The cecum lies deep within the pelvis. The appendix is normal. Fecal loading identified. Vascular/Lymphatic: Atherosclerotic changes are seen in the nonaneurysmal aorta. No adenopathy. Reproductive: Uterus and bilateral adnexa are unremarkable. Other: No abdominal wall hernia or abnormality. No abdominopelvic ascites. Musculoskeletal: No acute or significant osseous findings. IMPRESSION: 1. No acute abnormalities identified. 2. Periportal edema in the liver is nonspecific. 3. High attenuation the gallbladder could represent sludge  or stones. No wall thickening. 4. Colonic diverticulosis without diverticulitis. 5. Atherosclerotic changes in the nonaneurysmal aorta. Electronically Signed   By: Dorise Bullion III M.D   On: 12/19/2017 21:27   Dg Chest Portable 1 View  Result Date: 12/19/2017 CLINICAL DATA:  Chest pain EXAM: PORTABLE CHEST 1 VIEW COMPARISON:  12/16/17 FINDINGS: Cardiac shadow is stable. Left chest wall port is noted. Chronic changes in the apices are again seen. No focal infiltrate or sizable effusion is  noted. The previously seen changes on the left are not well appreciated on this exam. No acute bony abnormality is noted. IMPRESSION: No acute abnormality seen. Chronic changes in the apices are again seen. Electronically Signed   By: Inez Catalina M.D.   On: 12/19/2017 18:58   Mm 3d Screen Breast Bilateral  Result Date: 12/09/2017 CLINICAL DATA:  Screening. EXAM: DIGITAL SCREENING BILATERAL MAMMOGRAM WITH TOMO AND CAD COMPARISON:  Previous exam(s). ACR Breast Density Category c: The breast tissue is heterogeneously dense, which may obscure small masses. FINDINGS: There are no findings suspicious for malignancy. Images were processed with CAD. IMPRESSION: No mammographic evidence of malignancy. A result letter of this screening mammogram will be mailed directly to the patient. RECOMMENDATION: Screening mammogram in one year. (Code:SM-B-01Y) BI-RADS CATEGORY  1: Negative. Electronically Signed   By: Fidela Salisbury M.D.   On: 12/09/2017 14:37    Micro Results    No results found for this or any previous visit (from the past 240 hour(s)).     Today   Subjective:   Anna Mcbride today has no headache,no chest abdominal pain,no new weakness tingling or numbness, feels much better wants to go home today.  Objective:   Blood pressure 114/80, pulse 86, temperature 99.6 F (37.6 C), temperature source Oral, resp. rate 20, height 5\' 1"  (1.549 m), weight 52.7 kg (116 lb 2.9 oz), SpO2 95 %.  No intake or output data in the 24 hours ending 12/24/17 0653  Exam Awake Alert, Oriented x 3, No new F.N deficits, Normal affect [place].AT,PERRAL Supple Neck,No JVD, No cervical lymphadenopathy appriciated.  Symmetrical Chest wall movement, Good air movement bilaterally, CTAB RRR,No Gallops,Rubs or new Murmurs, No Parasternal Heave +ve B.Sounds, Abd Soft, Non tender, No organomegaly appriciated, No rebound -guarding or rigidity. No Cyanosis, Clubbing or edema, No new Rash or bruise  Data Review   CBC w  Diff:  Lab Results  Component Value Date   WBC 7.0 12/20/2017   HGB 13.1 12/20/2017   HGB 11.4 (L) 11/16/2014   HCT 38.1 12/20/2017   HCT 33.6 (L) 11/16/2014   PLT 192 12/20/2017   PLT 188 11/16/2014   LYMPHOPCT 29 10/27/2017   LYMPHOPCT 21.5 11/16/2014   MONOPCT 11 10/27/2017   MONOPCT 10.8 11/16/2014   EOSPCT 7 10/27/2017   EOSPCT 3.7 11/16/2014   BASOPCT 0 10/27/2017   BASOPCT 1.1 11/16/2014    CMP:  Lab Results  Component Value Date   NA 140 12/20/2017   NA 140 11/16/2014   K 4.3 12/20/2017   K 3.3 (L) 11/16/2014   CL 106 12/20/2017   CL 107 11/16/2014   CO2 25 12/20/2017   CO2 29 11/16/2014   BUN 51 (H) 12/20/2017   BUN 31 (H) 11/16/2014   CREATININE 1.45 (H) 12/20/2017   CREATININE 1.19 (H) 11/16/2014   PROT 7.5 12/20/2017   PROT 6.8 11/16/2014   ALBUMIN 3.9 12/20/2017   ALBUMIN 3.8 11/16/2014   BILITOT 0.6 12/20/2017   BILITOT 0.4 11/16/2014  ALKPHOS 57 12/20/2017   ALKPHOS 115 11/16/2014   AST 85 (H) 12/20/2017   AST 17 11/16/2014   ALT 26 12/20/2017   ALT 11 (L) 11/16/2014  .   Total Time in preparing paper work, data evaluation and todays exam - 24 minutes  Epifanio Lesches M.D on 12/21/2017 at 6:53 AM    Note: This dictation was prepared with Dragon dictation along with smaller phrase technology. Any transcriptional errors that result from this process are unintentional.

## 2017-12-29 ENCOUNTER — Inpatient Hospital Stay: Payer: Medicare Other | Attending: Internal Medicine

## 2017-12-29 DIAGNOSIS — C8331 Diffuse large B-cell lymphoma, lymph nodes of head, face, and neck: Secondary | ICD-10-CM | POA: Insufficient documentation

## 2017-12-29 DIAGNOSIS — Z8719 Personal history of other diseases of the digestive system: Secondary | ICD-10-CM | POA: Insufficient documentation

## 2017-12-29 DIAGNOSIS — Z95828 Presence of other vascular implants and grafts: Secondary | ICD-10-CM

## 2017-12-29 DIAGNOSIS — Z452 Encounter for adjustment and management of vascular access device: Secondary | ICD-10-CM | POA: Diagnosis present

## 2017-12-29 MED ORDER — HEPARIN SOD (PORK) LOCK FLUSH 100 UNIT/ML IV SOLN
500.0000 [IU] | Freq: Once | INTRAVENOUS | Status: AC
  Administered 2017-12-29: 500 [IU] via INTRAVENOUS

## 2017-12-29 MED ORDER — SODIUM CHLORIDE 0.9% FLUSH
10.0000 mL | Freq: Once | INTRAVENOUS | Status: AC
  Administered 2017-12-29: 10 mL via INTRAVENOUS
  Filled 2017-12-29: qty 10

## 2017-12-30 ENCOUNTER — Ambulatory Visit: Payer: Medicare Other | Admitting: Internal Medicine

## 2017-12-30 ENCOUNTER — Ambulatory Visit: Payer: Self-pay | Admitting: Surgery

## 2017-12-30 ENCOUNTER — Encounter: Payer: Self-pay | Admitting: Internal Medicine

## 2017-12-30 VITALS — BP 88/48 | HR 103 | Ht 61.0 in

## 2017-12-30 DIAGNOSIS — R0600 Dyspnea, unspecified: Secondary | ICD-10-CM

## 2017-12-30 MED ORDER — AZITHROMYCIN 250 MG PO TABS: 250.0000 mg | ORAL_TABLET | Freq: Every day | ORAL | 0 refills | Status: DC

## 2017-12-30 MED ORDER — IPRATROPIUM-ALBUTEROL 0.5-2.5 (3) MG/3ML IN SOLN
3.0000 mL | Freq: Once | RESPIRATORY_TRACT | Status: AC
  Administered 2017-12-30: 3 mL via RESPIRATORY_TRACT

## 2017-12-30 MED ORDER — PREDNISONE 10 MG (21) PO TBPK: ORAL_TABLET | ORAL | 0 refills | Status: DC
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
## 2017-12-30 NOTE — Patient Instructions (Addendum)
Use your nebulizer every 4 hours while awake until you feel better.  Will start prednisone and azithromycin.  If your condition worsens then go to the ED.

## 2017-12-30 NOTE — Addendum Note (Signed)
Addended by: Oscar La R on: 12/30/2017 04:07 PM   Modules accepted: Orders

## 2018-01-01 ENCOUNTER — Ambulatory Visit: Payer: Medicare Other | Admitting: Internal Medicine

## 2018-01-02 ENCOUNTER — Other Ambulatory Visit: Payer: Self-pay

## 2018-01-02 ENCOUNTER — Inpatient Hospital Stay
Admission: AD | Admit: 2018-01-02 | Discharge: 2018-01-08 | DRG: 189 | Disposition: A | Discharge status: Home Health | Payer: Medicare Other | Source: Ambulatory Visit | Attending: Internal Medicine | Admitting: Internal Medicine

## 2018-01-02 ENCOUNTER — Inpatient Hospital Stay: Payer: Medicare Other

## 2018-01-02 DIAGNOSIS — Z79899 Other long term (current) drug therapy: Secondary | ICD-10-CM

## 2018-01-02 DIAGNOSIS — Z7189 Other specified counseling: Secondary | ICD-10-CM | POA: Diagnosis not present

## 2018-01-02 DIAGNOSIS — E876 Hypokalemia: Secondary | ICD-10-CM | POA: Diagnosis present

## 2018-01-02 DIAGNOSIS — J189 Pneumonia, unspecified organism: Secondary | ICD-10-CM | POA: Diagnosis present

## 2018-01-02 DIAGNOSIS — J90 Pleural effusion, not elsewhere classified: Secondary | ICD-10-CM | POA: Diagnosis present

## 2018-01-02 DIAGNOSIS — E039 Hypothyroidism, unspecified: Secondary | ICD-10-CM | POA: Diagnosis present

## 2018-01-02 DIAGNOSIS — F4322 Adjustment disorder with anxiety: Secondary | ICD-10-CM | POA: Diagnosis present

## 2018-01-02 DIAGNOSIS — Y842 Radiological procedure and radiotherapy as the cause of abnormal reaction of the patient, or of later complication, without mention of misadventure at the time of the procedure: Secondary | ICD-10-CM | POA: Diagnosis present

## 2018-01-02 DIAGNOSIS — N289 Disorder of kidney and ureter, unspecified: Secondary | ICD-10-CM | POA: Diagnosis present

## 2018-01-02 DIAGNOSIS — C9111 Chronic lymphocytic leukemia of B-cell type in remission: Secondary | ICD-10-CM | POA: Diagnosis present

## 2018-01-02 DIAGNOSIS — Z7982 Long term (current) use of aspirin: Secondary | ICD-10-CM | POA: Diagnosis not present

## 2018-01-02 DIAGNOSIS — Z9981 Dependence on supplemental oxygen: Secondary | ICD-10-CM

## 2018-01-02 DIAGNOSIS — Z6822 Body mass index (BMI) 22.0-22.9, adult: Secondary | ICD-10-CM

## 2018-01-02 DIAGNOSIS — E440 Moderate protein-calorie malnutrition: Secondary | ICD-10-CM | POA: Diagnosis not present

## 2018-01-02 DIAGNOSIS — Z885 Allergy status to narcotic agent status: Secondary | ICD-10-CM | POA: Diagnosis not present

## 2018-01-02 DIAGNOSIS — J9601 Acute respiratory failure with hypoxia: Secondary | ICD-10-CM | POA: Diagnosis present

## 2018-01-02 DIAGNOSIS — I959 Hypotension, unspecified: Secondary | ICD-10-CM | POA: Diagnosis present

## 2018-01-02 DIAGNOSIS — Z881 Allergy status to other antibiotic agents status: Secondary | ICD-10-CM | POA: Diagnosis not present

## 2018-01-02 DIAGNOSIS — Z79890 Hormone replacement therapy (postmenopausal): Secondary | ICD-10-CM | POA: Diagnosis not present

## 2018-01-02 DIAGNOSIS — Z9481 Bone marrow transplant status: Secondary | ICD-10-CM | POA: Diagnosis not present

## 2018-01-02 DIAGNOSIS — T8383XA Hemorrhage of genitourinary prosthetic devices, implants and grafts, initial encounter: Secondary | ICD-10-CM | POA: Diagnosis not present

## 2018-01-02 DIAGNOSIS — Z923 Personal history of irradiation: Secondary | ICD-10-CM | POA: Diagnosis not present

## 2018-01-02 DIAGNOSIS — R0602 Shortness of breath: Secondary | ICD-10-CM | POA: Diagnosis not present

## 2018-01-02 DIAGNOSIS — R52 Pain, unspecified: Secondary | ICD-10-CM

## 2018-01-02 DIAGNOSIS — R1011 Right upper quadrant pain: Secondary | ICD-10-CM | POA: Diagnosis present

## 2018-01-02 DIAGNOSIS — J9621 Acute and chronic respiratory failure with hypoxia: Principal | ICD-10-CM | POA: Diagnosis present

## 2018-01-02 DIAGNOSIS — S3790XA Unspecified injury of unspecified urinary and pelvic organ, initial encounter: Secondary | ICD-10-CM | POA: Diagnosis not present

## 2018-01-02 DIAGNOSIS — Z9221 Personal history of antineoplastic chemotherapy: Secondary | ICD-10-CM | POA: Diagnosis not present

## 2018-01-02 DIAGNOSIS — Y658 Other specified misadventures during surgical and medical care: Secondary | ICD-10-CM | POA: Diagnosis not present

## 2018-01-02 DIAGNOSIS — Z888 Allergy status to other drugs, medicaments and biological substances status: Secondary | ICD-10-CM

## 2018-01-02 DIAGNOSIS — C8330 Diffuse large B-cell lymphoma, unspecified site: Secondary | ICD-10-CM | POA: Diagnosis present

## 2018-01-02 DIAGNOSIS — J700 Acute pulmonary manifestations due to radiation: Secondary | ICD-10-CM | POA: Diagnosis present

## 2018-01-02 DIAGNOSIS — J701 Chronic and other pulmonary manifestations due to radiation: Secondary | ICD-10-CM | POA: Diagnosis present

## 2018-01-02 DIAGNOSIS — Z8572 Personal history of non-Hodgkin lymphomas: Secondary | ICD-10-CM | POA: Diagnosis not present

## 2018-01-02 DIAGNOSIS — Z7951 Long term (current) use of inhaled steroids: Secondary | ICD-10-CM

## 2018-01-02 DIAGNOSIS — J8410 Pulmonary fibrosis, unspecified: Secondary | ICD-10-CM | POA: Diagnosis present

## 2018-01-02 DIAGNOSIS — R739 Hyperglycemia, unspecified: Secondary | ICD-10-CM | POA: Diagnosis present

## 2018-01-02 DIAGNOSIS — Z515 Encounter for palliative care: Secondary | ICD-10-CM | POA: Diagnosis not present

## 2018-01-02 LAB — CBC
HEMATOCRIT: 36.7 % (ref 35.0–47.0)
Hemoglobin: 12.2 g/dL (ref 12.0–16.0)
MCH: 32.6 pg (ref 26.0–34.0)
MCHC: 33.3 g/dL (ref 32.0–36.0)
MCV: 98.1 fL (ref 80.0–100.0)
Platelets: 226 10*3/uL (ref 150–440)
RBC: 3.74 MIL/uL — ABNORMAL LOW (ref 3.80–5.20)
RDW: 14.6 % — AB (ref 11.5–14.5)
WBC: 11.1 10*3/uL — ABNORMAL HIGH (ref 3.6–11.0)

## 2018-01-02 LAB — BLOOD GAS, ARTERIAL
ALLENS TEST (PASS/FAIL): POSITIVE — AB
Acid-base deficit: 1.1 mmol/L (ref 0.0–2.0)
Bicarbonate: 22.4 mmol/L (ref 20.0–28.0)
FIO2: 0.32
O2 Saturation: 96.9 %
PATIENT TEMPERATURE: 37
PH ART: 7.44 (ref 7.350–7.450)
pCO2 arterial: 33 mmHg (ref 32.0–48.0)
pO2, Arterial: 86 mmHg (ref 83.0–108.0)

## 2018-01-02 LAB — COMPREHENSIVE METABOLIC PANEL
ALBUMIN: 3 g/dL — AB (ref 3.5–5.0)
ALT: 50 U/L (ref 14–54)
ANION GAP: 11 (ref 5–15)
AST: 47 U/L — ABNORMAL HIGH (ref 15–41)
Alkaline Phosphatase: 67 U/L (ref 38–126)
BUN: 45 mg/dL — ABNORMAL HIGH (ref 6–20)
CO2: 24 mmol/L (ref 22–32)
Calcium: 9.1 mg/dL (ref 8.9–10.3)
Chloride: 104 mmol/L (ref 101–111)
Creatinine, Ser: 0.96 mg/dL (ref 0.44–1.00)
GFR calc Af Amer: >60 mL/min (ref >60)
GFR, EST NON AFRICAN AMERICAN: 57 mL/min — AB (ref >60)
Glucose, Bld: 113 mg/dL — ABNORMAL HIGH (ref 65–99)
POTASSIUM: 4.3 mmol/L (ref 3.5–5.1)
Sodium: 139 mmol/L (ref 135–145)
TOTAL PROTEIN: 6.8 g/dL (ref 6.5–8.1)
Total Bilirubin: 0.9 mg/dL (ref 0.3–1.2)

## 2018-01-02 LAB — AMYLASE: AMYLASE: 74 U/L (ref 28–100)

## 2018-01-02 LAB — LIPASE, BLOOD: Lipase: 53 U/L — ABNORMAL HIGH (ref 11–51)

## 2018-01-02 MED ORDER — BUDESONIDE 0.5 MG/2ML IN SUSP
0.5000 mg | Freq: Two times a day (BID) | RESPIRATORY_TRACT | Status: DC
  Administered 2018-01-02 – 2018-01-08 (×12): 0.5 mg via RESPIRATORY_TRACT
  Filled 2018-01-02 (×13): qty 2

## 2018-01-02 MED ORDER — TRAMADOL HCL 50 MG PO TABS: 50.0000 mg | ORAL_TABLET | Freq: Four times a day (QID) | ORAL | Status: DC | PRN

## 2018-01-02 MED ORDER — ONDANSETRON HCL 4 MG PO TABS
4.0000 mg | ORAL_TABLET | Freq: Three times a day (TID) | ORAL | Status: DC | PRN
  Administered 2018-01-03: 4 mg via ORAL
  Filled 2018-01-02: qty 1

## 2018-01-02 MED ORDER — ALPRAZOLAM 0.5 MG PO TABS
0.5000 mg | ORAL_TABLET | Freq: Three times a day (TID) | ORAL | Status: DC | PRN
  Administered 2018-01-02 – 2018-01-03 (×2): 0.5 mg via ORAL
  Filled 2018-01-02 (×2): qty 1

## 2018-01-02 MED ORDER — FUROSEMIDE 10 MG/ML IJ SOLN: 20.0000 mg | Freq: Once | INTRAMUSCULAR | Status: DC

## 2018-01-02 MED ORDER — IPRATROPIUM-ALBUTEROL 0.5-2.5 (3) MG/3ML IN SOLN
3.0000 mL | Freq: Four times a day (QID) | RESPIRATORY_TRACT | Status: DC
  Administered 2018-01-02 – 2018-01-08 (×25): 3 mL via RESPIRATORY_TRACT
  Filled 2018-01-02 (×26): qty 3

## 2018-01-02 MED ORDER — METHYLPREDNISOLONE SODIUM SUCC 125 MG IJ SOLR
60.0000 mg | Freq: Four times a day (QID) | INTRAMUSCULAR | Status: DC
  Administered 2018-01-02 – 2018-01-05 (×11): 60 mg via INTRAVENOUS
  Filled 2018-01-02 (×11): qty 2

## 2018-01-02 MED ORDER — FAMOTIDINE 20 MG PO TABS
20.0000 mg | ORAL_TABLET | Freq: Every day | ORAL | Status: DC
  Administered 2018-01-02 – 2018-01-08 (×7): 20 mg via ORAL
  Filled 2018-01-02 (×8): qty 1

## 2018-01-02 MED ORDER — PIPERACILLIN-TAZOBACTAM 3.375 G IVPB
3.3750 g | Freq: Three times a day (TID) | INTRAVENOUS | Status: DC
  Administered 2018-01-02 – 2018-01-06 (×11): 3.375 g via INTRAVENOUS
  Filled 2018-01-02 (×10): qty 50

## 2018-01-02 MED ORDER — FUROSEMIDE 20 MG PO TABS
20.0000 mg | ORAL_TABLET | Freq: Every day | ORAL | Status: DC
  Administered 2018-01-02: 20:00:00 20 mg via ORAL
  Filled 2018-01-02: qty 1

## 2018-01-02 MED ORDER — VANCOMYCIN HCL IN DEXTROSE 1-5 GM/200ML-% IV SOLN
1000.0000 mg | Freq: Once | INTRAVENOUS | Status: AC
  Administered 2018-01-02: 1000 mg via INTRAVENOUS
  Filled 2018-01-02: qty 200

## 2018-01-02 MED ORDER — AZITHROMYCIN 500 MG PO TABS
250.0000 mg | ORAL_TABLET | Freq: Every day | ORAL | Status: DC
  Administered 2018-01-02: 250 mg via ORAL
  Filled 2018-01-02 (×2): qty 1

## 2018-01-02 MED ORDER — VITAMIN C 500 MG PO TABS
500.0000 mg | ORAL_TABLET | Freq: Every day | ORAL | Status: DC
  Administered 2018-01-04 – 2018-01-08 (×5): 500 mg via ORAL
  Filled 2018-01-02 (×8): qty 1

## 2018-01-02 MED ORDER — ASPIRIN EC 81 MG PO TBEC
81.0000 mg | DELAYED_RELEASE_TABLET | Freq: Every day | ORAL | Status: DC
  Administered 2018-01-02 – 2018-01-08 (×7): 81 mg via ORAL
  Filled 2018-01-02 (×8): qty 1

## 2018-01-02 MED ORDER — PANTOPRAZOLE SODIUM 40 MG PO TBEC
40.0000 mg | DELAYED_RELEASE_TABLET | Freq: Every day | ORAL | Status: DC
  Administered 2018-01-02 – 2018-01-08 (×7): 40 mg via ORAL
  Filled 2018-01-02 (×8): qty 1

## 2018-01-02 MED ORDER — ALBUTEROL SULFATE (2.5 MG/3ML) 0.083% IN NEBU: 2.5000 mg | INHALATION_SOLUTION | RESPIRATORY_TRACT | Status: DC | PRN

## 2018-01-02 MED ORDER — METHYLPREDNISOLONE SODIUM SUCC 125 MG IJ SOLR
125.0000 mg | INTRAMUSCULAR | Status: AC
  Administered 2018-01-02: 125 mg via INTRAVENOUS
  Filled 2018-01-02: qty 2

## 2018-01-02 MED ORDER — ARFORMOTEROL TARTRATE 15 MCG/2ML IN NEBU
15.0000 ug | INHALATION_SOLUTION | Freq: Two times a day (BID) | RESPIRATORY_TRACT | Status: DC
  Administered 2018-01-02 – 2018-01-05 (×6): 15 ug via RESPIRATORY_TRACT
  Filled 2018-01-02 (×9): qty 2

## 2018-01-02 MED ORDER — SODIUM CHLORIDE 0.9 % IV SOLN
INTRAVENOUS | Status: DC
  Administered 2018-01-02 – 2018-01-03 (×2): via INTRAVENOUS

## 2018-01-02 MED ORDER — CITALOPRAM HYDROBROMIDE 20 MG PO TABS
20.0000 mg | ORAL_TABLET | Freq: Every day | ORAL | Status: DC
  Administered 2018-01-02 – 2018-01-06 (×5): 20 mg via ORAL
  Filled 2018-01-02 (×6): qty 1

## 2018-01-02 MED ORDER — VANCOMYCIN HCL IN DEXTROSE 750-5 MG/150ML-% IV SOLN
750.0000 mg | INTRAVENOUS | Status: DC
  Administered 2018-01-03: 750 mg via INTRAVENOUS
  Filled 2018-01-02: qty 150

## 2018-01-02 MED ORDER — POTASSIUM CHLORIDE CRYS ER 20 MEQ PO TBCR
20.0000 meq | EXTENDED_RELEASE_TABLET | Freq: Every day | ORAL | Status: DC
  Administered 2018-01-03 – 2018-01-08 (×6): 20 meq via ORAL
  Filled 2018-01-02 (×7): qty 1

## 2018-01-02 MED ORDER — ENOXAPARIN SODIUM 30 MG/0.3ML ~~LOC~~ SOLN
30.0000 mg | Freq: Every day | SUBCUTANEOUS | Status: DC
Start: 2018-01-02 — End: 2018-01-08
  Administered 2018-01-02 – 2018-01-08 (×7): 30 mg via SUBCUTANEOUS
  Filled 2018-01-02 (×8): qty 0.3

## 2018-01-02 NOTE — H&P (Addendum)
[facility] [place] NAME: Anna Mcbride    MR#:  [id number]  DATE OF BIRTH:  02/20/1946  DATE OF ADMISSION:  01/02/2018  PRIMARY CARE PHYSICIAN: Baxter Hire, MD   REQUESTING/REFERRING PHYSICIAN: Dr. Edwina Barth  CHIEF COMPLAINT:   Shortness of breath HISTORY OF PRESENT ILLNESS:  Anna Mcbride  is a 72 y.o. female with a known history of chronic hypoxic respiratory failure living on 2 L of oxygen, non-Hodgkin's lymphoma with recurrence seeing Dr. Rogue Bussing as an outpatient, on chemotherapy, seeing Dr. Ashby Dawes and Dr. Mortimer Fries for history of chronic pulmonary infiltrates from the previous radiation treatment was seen by primary care physician and patient was placed on Z-Pak and prednisone tapering dose with no clinical improvement.  Patient was desaturating on today's primary care physician follow-up with increased shortness of breath and patient is sent over to the hospital as a direct admit.  During my examination patient is not short of breath and very anxious.  Today's patient's 63th wedding anniversary and they have planned a big  party tomorrow evening and patient desperately wanted to get better by tomorrow evening to attend the function.  Anna Mcbride at bedside.  Patient denies any chest pain, currently on 3 L of oxygen via nasal cannula.  Also reporting right upper quadrant abdominal pain recent CT abdomen on 12/19/2017 has revealed biliary sludge.  CTA chest performed on 12/18/2017 has revealed no pulmonary embolism but narrowing of the left subclavian and innominate arteries and stable pleural and parenchymal changes related to the lymphoma and improved groundglass opacities of inflammatory origin  PAST MEDICAL HISTORY:   Past Medical History:  Diagnosis Date  . Acquired hypothyroidism 07/01/2013  . Acute respiratory failure ([place]) 11/29/2014  . 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 Asthma   . Back pain with sciatica 05/07/2017  .  Biliary sludge 12/19/2017  . Lymphoma, non-Hodgkin's ([place]) 06/30/2013  . Non Hodgkin's lymphoma ([place]) [id number]  . Personal history of chemotherapy   . Recurrent upper respiratory infection (URI) 09/01/2015  . 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 NASAL SINUS SURGERY  08/07/05 09/09/05   x 2   . VIDEO BRONCHOSCOPY Bilateral 11/30/2014   Procedure: VIDEO BRONCHOSCOPY WITHOUT FLUORO;  Surgeon: Flora Lipps, MD;  Location: [facility] ORS;  Service: Cardiopulmonary;  Laterality: Bilateral;    SOCIAL HISTORY:   Social History   Tobacco Use  . Smoking status: Never Smoker  . Smokeless tobacco: Never Used  Substance Use Topics  . Alcohol use: Not Currently    Frequency: Never    Comment: a couple glasses of wine a year    FAMILY HISTORY:   Family History  Problem Relation Age of Onset  . Asthma Son   . Congestive Heart Failure Mother   . Diabetes Brother   . Pulmonary embolism Father   . Breast cancer Neg Hx     DRUG ALLERGIES:   Allergies  Allergen Reactions  . Hydrocodone-Homatropine Itching  . Tussionex Pennkinetic Er [Hydrocod Polst-Cpm Polst Er] Itching  . Ciprofloxacin Rash    REVIEW OF SYSTEMS:  CONSTITUTIONAL: No fever, fatigue or weakness.  EYES: No blurred or double vision.  EARS, NOSE, AND THROAT: No tinnitus or ear pain.  RESPIRATORY: No cough, reporting worsening of shortness of breath, wheezing or hemoptysis.  CARDIOVASCULAR: No chest pain, orthopnea, edema.  GASTROINTESTINAL: No  nausea, vomiting, diarrhea or abdominal pain.  GENITOURINARY: No dysuria, hematuria.  ENDOCRINE: No polyuria, nocturia,  HEMATOLOGY: No anemia, easy bruising or bleeding SKIN: No rash or lesion. MUSCULOSKELETAL: No joint pain or arthritis.   NEUROLOGIC: No tingling, numbness, weakness.  PSYCHIATRY: Patient is very anxious.   MEDICATIONS AT HOME:   Prior  to Admission medications   Medication Sig Start Date End Date Taking? Authorizing Provider  ALPRAZolam (XANAX) 0.5 MG tablet TAKE 1 TABLET BY MOUTH AT BEDTIME AS NEEDED FOR SLEEP 12/10/16  Yes Flora Lipps, MD  aspirin EC 81 MG tablet Take 81 mg by mouth daily.   Yes [provider]  azithromycin (ZITHROMAX) 250 MG tablet Take 1 tablet (250 mg total) by mouth daily. Take 2 tabs at once on the first day, then once daily. 12/30/17  Yes [name], MD  budesonide (PULMICORT) 0.5 MG/2ML nebulizer solution Take 2 mLs (0.5 mg total) by nebulization 2 (two) times daily. 01/06/17  Yes Kasa, Maretta Bees, MD  citalopram (CELEXA) 20 MG tablet TAKE 1 TABLET BY MOUTH ONCE DAILY 12/16/17  Yes Cammie Sickle, MD  levothyroxine (SYNTHROID, LEVOTHROID) 25 MCG tablet TAKE 1 TABLET BY MOUTH ONCE DAILY ON AN EMPTY STOMACH. WAIT 30 MINUTES BEFORE TAKING OTHER MEDS. 12/03/17  Yes Cammie Sickle, MD  omeprazole (PRILOSEC) 20 MG capsule TAKE 1 CAPSULE BY MOUTH TWICE DAILY BEFORE MEALS 02/03/17  Yes Cammie Sickle, MD  potassium chloride SA (K-DUR,KLOR-CON) 20 MEQ tablet Take 1 tablet (20 mEq total) by mouth daily. 10/27/17  Yes Cammie Sickle, MD  predniSONE (STERAPRED UNI-PAK 21 TAB) 10 MG (21) TBPK tablet Take as directed. Patient taking differently: Take 10 mg by mouth taper from 4 doses each day to 1 dose and stop.  12/30/17  Yes [name], MD  vitamin C (ASCORBIC ACID) 500 MG tablet Take 500 mg by mouth daily.   Yes [provider]  albuterol (PROVENTIL HFA;VENTOLIN HFA) 108 (90 BASE) MCG/ACT inhaler Inhale 1 puff into the lungs every 6 (six) hours as needed for wheezing or shortness of breath.    [provider]  albuterol (PROVENTIL) (2.5 MG/3ML) 0.083% nebulizer solution Take 3 mLs (2.5 mg total) by nebulization every 4 (four) hours as needed for wheezing or shortness of breath. 01/06/17   Flora Lipps, MD  AMBULATORY NON FORMULARY MEDICATION Medication  Name: Incentive Spirometer Use 10-15 times daily 07/29/16   Flora Lipps, MD  formoterol (PERFOROMIST) 20 MCG/2ML nebulizer solution Take 2 mLs (20 mcg total) by nebulization 2 (two) times daily. Patient not taking: Reported on 01/02/2018 01/06/17   Flora Lipps, MD  ondansetron (ZOFRAN ODT) 4 MG disintegrating tablet Take 1 tablet (4 mg total) every 8 (eight) hours as needed by mouth for nausea or vomiting. Patient not taking: Reported on 01/02/2018 05/30/17   Hillary Bow, MD  Respiratory Therapy Supplies (FLUTTER) DEVI Use 10-15 times daily 07/29/16   Flora Lipps, MD      VITAL SIGNS:  Blood pressure 110/81, pulse 100, temperature (!) 97.5 F (36.4 C), temperature source Oral, height 5\' 1"  (1.549 m), weight 52.9 kg (116 lb 10 oz), SpO2 100 %.  PHYSICAL EXAMINATION:  GENERAL:  72 y.o.-year-old patient lying in the bed with no acute distress.  Very anxious EYES: Pupils equal, round, reactive to light and accommodation. No scleral icterus. Extraocular muscles intact.  HEENT: Head atraumatic, normocephalic. Oropharynx and nasopharynx clear.  NECK:  Supple, no jugular venous distention. No thyroid enlargement, no tenderness.  LUNGS: Moderately diminished breath  sounds bilaterally with diffuse wheezing no rales or rhonchi no accessory muscle use CARDIOVASCULAR: S1, S2 normal. No murmurs, rubs, or gallops.  ABDOMEN: Soft, nontender, nondistended. Bowel sounds present. EXTREMITIES: No pedal edema, cyanosis, or clubbing.  NEUROLOGIC: Cranial nerves II through XII are intact. Muscle strength 5/5 in all extremities. Sensation intact. Gait not checked.  PSYCHIATRIC: The patient is alert and oriented x 3.  SKIN: No obvious rash, lesion, or ulcer.   LABORATORY PANEL:   CBC Recent Labs  Lab 01/02/18 1158  WBC 11.1*  HGB 12.2  HCT 36.7  PLT 226   ------------------------------------------------------------------------------------------------------------------  Chemistries  Recent Labs  Lab  01/02/18 1158  NA 139  K 4.3  CL 104  CO2 24  GLUCOSE 113*  BUN 45*  CREATININE 0.96  CALCIUM 9.1  AST 47*  ALT 50  ALKPHOS 67  BILITOT 0.9   ------------------------------------------------------------------------------------------------------------------  Cardiac Enzymes No results for input(s): TROPONINI in the last 168 hours. ------------------------------------------------------------------------------------------------------------------  RADIOLOGY:  No results found.  EKG:   Orders placed or performed during the hospital encounter of 12/19/17  . ED EKG within 10 minutes  . ED EKG within 10 minutes    IMPRESSION AND PLAN:   Erum Cercone  is a 71 y.o. female with a known history of chronic hypoxic respiratory failure living on 2 L of oxygen, non-Hodgkin's lymphoma with recurrence seeing Dr. Rogue Bussing as an outpatient, on chemotherapy, seeing Dr. Ashby Dawes and Dr. Mortimer Fries for history of chronic pulmonary infiltrates from the previous radiation treatment was seen by primary care physician and patient was placed on Z-Pak and prednisone tapering dose with no clinical improvement.  Patient was desaturating on today's primary care physician follow-up with increased shortness of breath and patient is sent over to the hospital as a direct admit.  During my examination patient is not short of breath and very anxious.   #Acute on chronic hypoxic respiratory failure continue to radiation induced pulmonary fibrosis Admit to MedSurg unit Continue oxygen via nasal cannula and wean off as tolerated to home oxygen 2 L Solu-Medrol and  bronchodilator treatment Complete azithromycin course for 3 more days to finish the course Pulmonology consult is placed to Dr. Ashby Dawes  will order echocardiogram  #Anxiety Xanax as needed  #History of non-Hodgkin's lymphoma with recurrence currently on chemotherapy Consult placed to oncology Dr. Jacinto Reap  #Right upper quadrant abdominal pain  recent  CT abdomen which was performed on May 31 has revealed biliary sludge Pain management as needed General surgery consult is placed  DVT prophylaxis with Lovenox, GI prophylaxis Pepcid   All the records are reviewed and case discussed with ED provider. Management plans discussed with the patient, family and they are in agreement.  CODE STATUS: Full code  TOTAL TIME TAKING CARE OF THIS PATIENT: 43 minutes.   Note: This dictation was prepared with Dragon dictation along with smaller phrase technology. Any transcriptional errors that result from this process are unintentional.  [name] M.D on 01/02/2018 at 2:13 PM  Between 7am to 6pm - Pager - [phone]  After 6pm go to [email or web address] - password [email or web address] [facility]  Office  [phone]  CC: Primary care physician; Baxter Hire, MD

## 2018-01-02 NOTE — Progress Notes (Signed)
Pharmacy Antibiotic Note  ZENITH KERCHEVAL is a 72 y.o. female admitted on 01/02/2018 with pneumonia.  Pharmacy has been consulted for vancomycin and Zosyn dosing. She has a known history of chronic hypoxic respiratory failure living on 2 L of oxygen, non-Hodgkin's lymphoma with recurrence seeing Dr. Rogue Bussing as an outpatient, on chemotherapy, seeing Dr. Ashby Dawes and Dr. Mortimer Fries for history of chronic pulmonary infiltrates from the previous radiation treatment was seen by primary care physician and patient was placed on Z-Pak and prednisone tapering dose with no clinical improvement.  Patient was desaturating on today's primary care physician follow-up with increased shortness of breath and patient is sent over to the hospital as a direct admit. She was originally thought not to have PNA but CXR revealed pulmonary infiltrates.   Plan: Vancomycin 1000mg  load followed by 750mg  IV every 24 hours following a calculated stacked dose time of 13 hours.  Goal trough 15-20 mcg/mL. Zosyn 3.375g EI q8h. Vancomycin trough will be drawn prior to the 4th dose  Ke: 0.038, Vd: 37L, T1/2: 18h, calculated steady-state concentrations: 37.5/15.7 mcg/mL  Height: 5\' 1"  (154.9 cm) Weight: 116 lb 10 oz (52.9 kg) IBW/kg (Calculated) : 47.8  Temp (24hrs), Avg:97.5 F (36.4 C), Min:97.5 F (36.4 C), Max:97.5 F (36.4 C)  Recent Labs  Lab 01/02/18 1158  WBC 11.1*  CREATININE 0.96    Estimated Creatinine Clearance: 38.8 mL/min (by C-G formula based on SCr of 0.96 mg/dL).    Allergies  Allergen Reactions  . Hydrocodone-Homatropine Itching  . Tussionex Pennkinetic Er [Hydrocod Polst-Cpm Polst Er] Itching  . Ciprofloxacin Rash    Antimicrobials this admission: Azithromycin 6/14 Vancomycin 6/14>> Zosyn 6/14 >>   Microbiology results: 6/14 Sputum:  6/15 MRSA PCR:   Thank you for allowing pharmacy to be a part of this patient's care.  Dallie Piles, PharmD 01/02/2018 6:59 PM

## 2018-01-02 NOTE — Progress Notes (Signed)
Family Meeting Note  Advance Directive:yes  Today a meeting took place with the Patient, husband at bed side     The following clinical team members were present during this meeting:MD  The following were discussed:Patient's diagnosis: Acute on chronic hypoxic respiratory failure, chronic pulmonary infiltrates, non-Hodgkin's lymphoma with recurrence currently on chemotherapy, anxiety, right upper quadrant abdominal pain, other comorbidities including hyperlipidemia, GERD, chronic biliary sludge, treatment plan of care discussed in detail with the patient and her husband at bedside.  They both verbalized understanding of the plan   patient's progosis: Unable to determine and Goals for treatment: Full Code, husband is a healthcare power of attorney  Additional follow-up to be provided: Hospitalist, pulmonology, oncology and general surgery  Time spent during discussion:17 min  [name], MD

## 2018-01-02 NOTE — Plan of Care (Signed)
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
## 2018-01-02 NOTE — Progress Notes (Signed)
Patient is short of breath stat chest x-ray has revealed new pulmonary infiltrates and some fluid.  Starting patient on broad-spectrum IV antibiotics Zosyn and vancomycin and Lasix 20 mg IV ordered will reassess the patient again.  Plan of care discussed in detail with the patient and her husband at bedside.  Discontinued azithromycin

## 2018-01-02 NOTE — Progress Notes (Signed)
Notified Dr. Margaretmary Eddy of patient's increased SOB, respirations 35BPM. Received verbal order for stat chest x-ray and ABG.

## 2018-01-02 NOTE — Progress Notes (Signed)
Received verbal order for lasix 20mg  po, discussed with Dr. Margaretmary Eddy. Patient given xanax prn for anxiety. Respirations at 28BPM

## 2018-01-03 ENCOUNTER — Inpatient Hospital Stay: Admission: AD | Admit: 2018-01-03 | Payer: Medicare Other | Source: Ambulatory Visit | Admitting: Internal Medicine

## 2018-01-03 ENCOUNTER — Inpatient Hospital Stay: Payer: Medicare Other

## 2018-01-03 DIAGNOSIS — Z888 Allergy status to other drugs, medicaments and biological substances status: Secondary | ICD-10-CM

## 2018-01-03 DIAGNOSIS — Z885 Allergy status to narcotic agent status: Secondary | ICD-10-CM

## 2018-01-03 DIAGNOSIS — Z8572 Personal history of non-Hodgkin lymphomas: Secondary | ICD-10-CM

## 2018-01-03 DIAGNOSIS — Z881 Allergy status to other antibiotic agents status: Secondary | ICD-10-CM

## 2018-01-03 DIAGNOSIS — R0602 Shortness of breath: Secondary | ICD-10-CM

## 2018-01-03 DIAGNOSIS — Z9221 Personal history of antineoplastic chemotherapy: Secondary | ICD-10-CM

## 2018-01-03 DIAGNOSIS — R1013 Epigastric pain: Secondary | ICD-10-CM

## 2018-01-03 LAB — BASIC METABOLIC PANEL
ANION GAP: 11 (ref 5–15)
BUN: 46 mg/dL — ABNORMAL HIGH (ref 6–20)
CALCIUM: 8.4 mg/dL — AB (ref 8.9–10.3)
CHLORIDE: 102 mmol/L (ref 101–111)
CO2: 24 mmol/L (ref 22–32)
Creatinine, Ser: 1.41 mg/dL — ABNORMAL HIGH (ref 0.44–1.00)
GFR calc non Af Amer: 36 mL/min — ABNORMAL LOW (ref >60)
GFR, EST AFRICAN AMERICAN: 41 mL/min — AB (ref >60)
GLUCOSE: 135 mg/dL — AB (ref 65–99)
Potassium: 3.7 mmol/L (ref 3.5–5.1)
Sodium: 137 mmol/L (ref 135–145)

## 2018-01-03 LAB — MRSA PCR SCREENING: MRSA by PCR: NEGATIVE

## 2018-01-03 LAB — BRAIN NATRIURETIC PEPTIDE: B Natriuretic Peptide: 1133 pg/mL — ABNORMAL HIGH (ref 0.0–100.0)

## 2018-01-03 MED ORDER — DEXMEDETOMIDINE HCL IN NACL 400 MCG/100ML IV SOLN
0.4000 ug/kg/h | INTRAVENOUS | Status: DC
Start: 2018-01-03 — End: 2018-01-05
  Administered 2018-01-03: 0.4 ug/kg/h via INTRAVENOUS
  Administered 2018-01-04: 0.5 ug/kg/h via INTRAVENOUS
  Filled 2018-01-03: qty 100

## 2018-01-03 MED ORDER — FUROSEMIDE 40 MG PO TABS: 60.0000 mg | ORAL_TABLET | Freq: Every day | ORAL | Status: DC

## 2018-01-03 MED ORDER — ONDANSETRON HCL 4 MG/2ML IJ SOLN
INTRAMUSCULAR | Status: AC
  Administered 2018-01-03: 4 mg via INTRAVENOUS
  Filled 2018-01-03: qty 2

## 2018-01-03 MED ORDER — FUROSEMIDE 10 MG/ML IJ SOLN: 40.0000 mg | Freq: Once | INTRAMUSCULAR | Status: DC

## 2018-01-03 MED ORDER — ONDANSETRON HCL 4 MG/2ML IJ SOLN
4.0000 mg | Freq: Four times a day (QID) | INTRAMUSCULAR | Status: DC | PRN
Start: 2018-01-03 — End: 2018-01-08
  Administered 2018-01-03 – 2018-01-05 (×2): 4 mg via INTRAVENOUS
  Filled 2018-01-03: qty 2

## 2018-01-03 MED ORDER — FUROSEMIDE 10 MG/ML IJ SOLN
40.0000 mg | Freq: Once | INTRAMUSCULAR | Status: AC
  Administered 2018-01-03: 16:00:00 40 mg via INTRAVENOUS
  Filled 2018-01-03: qty 4

## 2018-01-03 MED ORDER — FUROSEMIDE 20 MG PO TABS
20.0000 mg | ORAL_TABLET | Freq: Every day | ORAL | Status: DC
  Administered 2018-01-04 – 2018-01-08 (×5): 20 mg via ORAL
  Filled 2018-01-03 (×5): qty 1

## 2018-01-03 MED ORDER — DEXMEDETOMIDINE HCL IN NACL 400 MCG/100ML IV SOLN
INTRAVENOUS | Status: AC
  Administered 2018-01-03: 0.4 ug/kg/h via INTRAVENOUS
  Filled 2018-01-03: qty 100

## 2018-01-03 MED ORDER — ALPRAZOLAM 0.5 MG PO TABS
0.5000 mg | ORAL_TABLET | Freq: Four times a day (QID) | ORAL | Status: DC | PRN
  Administered 2018-01-04 – 2018-01-05 (×4): 0.5 mg via ORAL
  Filled 2018-01-03 (×4): qty 1

## 2018-01-03 MED ORDER — VANCOMYCIN HCL IN DEXTROSE 750-5 MG/150ML-% IV SOLN
750.0000 mg | INTRAVENOUS | Status: DC
  Administered 2018-01-05: 750 mg via INTRAVENOUS
  Filled 2018-01-03: qty 150

## 2018-01-03 MED ORDER — NYSTATIN 100000 UNIT/ML MT SUSP
5.0000 mL | Freq: Four times a day (QID) | OROMUCOSAL | Status: DC
  Administered 2018-01-03 – 2018-01-08 (×16): 500000 [IU] via ORAL
  Filled 2018-01-03 (×19): qty 5

## 2018-01-03 NOTE — Progress Notes (Signed)
Patient stated she is going to stay in the hospital as an inpatient and not leave AMA, notified Dr. Darvin Neighbours.

## 2018-01-03 NOTE — Progress Notes (Addendum)
Received verbal order by Dr. Jefferson Fuel to send patient to ICU and 40mg  of IV lasix.

## 2018-01-03 NOTE — Plan of Care (Deleted)

## 2018-01-03 NOTE — Progress Notes (Signed)
[facility] [place] NAME: Anna Mcbride    MR#:  [id number]  DATE OF BIRTH:  October 02, 1945  SUBJECTIVE:  CHIEF COMPLAINT:  No chief complaint on file.  Patient continues to be extremely short of breath.  Acute episode of shortness of breath and wheezing after she went to use the bedside commode.  Anxious.   REVIEW OF SYSTEMS:    Review of Systems  Constitutional: Positive for diaphoresis and malaise/fatigue. Negative for chills and fever.  HENT: Negative for sore throat.   Eyes: Negative for blurred vision, double vision and pain.  Respiratory: Positive for cough, shortness of breath and wheezing. Negative for hemoptysis and sputum production.   Cardiovascular: Negative for chest pain, palpitations, orthopnea and leg swelling.  Gastrointestinal: Negative for abdominal pain, constipation, diarrhea, heartburn, nausea and vomiting.  Genitourinary: Negative for dysuria and hematuria.  Musculoskeletal: Negative for back pain and joint pain.  Skin: Negative for rash.  Neurological: Negative for sensory change, speech change, focal weakness and headaches.  Endo/Heme/Allergies: Does not bruise/bleed easily.  Psychiatric/Behavioral: Negative for depression. The patient is not nervous/anxious.     DRUG ALLERGIES:   Allergies  Allergen Reactions  . Hydrocodone-Homatropine Itching  . Tussionex Pennkinetic Er [Hydrocod Polst-Cpm Polst Er] Itching  . Ciprofloxacin Rash    VITALS:  Blood pressure 125/84, pulse (!) 107, temperature 97.6 F (36.4 C), temperature source Oral, resp. rate (!) 30, height 5\' 1"  (1.549 m), weight 52.9 kg (116 lb 10 oz), SpO2 100 %.  PHYSICAL EXAMINATION:   Physical Exam  GENERAL:  72 y.o.-year-old patient lying in the bed with resp distress EYES: Pupils equal, round, reactive to light and accommodation. No scleral icterus. Extraocular muscles intact.  HEENT: Head atraumatic, normocephalic. Oropharynx and nasopharynx  clear.  NECK:  Supple, no jugular venous distention. No thyroid enlargement, no tenderness.  LUNGS: Work of breathing.  Bilateral wheezing and coarse breath sounds. CARDIOVASCULAR: S1, S2 normal. No murmurs, rubs, or gallops.  ABDOMEN: Soft, nontender, nondistended. Bowel sounds present. No organomegaly or mass.  EXTREMITIES: No cyanosis, clubbing or edema b/l.    NEUROLOGIC: Cranial nerves II through XII are intact. No focal Motor or sensory deficits b/l.   PSYCHIATRIC: The patient is alert and oriented x 3.  Anxious SKIN: No obvious rash, lesion, or ulcer.   LABORATORY PANEL:   CBC Recent Labs  Lab 01/02/18 1158  WBC 11.1*  HGB 12.2  HCT 36.7  PLT 226   ------------------------------------------------------------------------------------------------------------------ Chemistries  Recent Labs  Lab 01/02/18 1158 01/03/18 0806  NA 139 137  K 4.3 3.7  CL 104 102  CO2 24 24  GLUCOSE 113* 135*  BUN 45* 46*  CREATININE 0.96 1.41*  CALCIUM 9.1 8.4*  AST 47*  --   ALT 50  --   ALKPHOS 67  --   BILITOT 0.9  --    ------------------------------------------------------------------------------------------------------------------  Cardiac Enzymes No results for input(s): TROPONINI in the last 168 hours. ------------------------------------------------------------------------------------------------------------------  RADIOLOGY:  Dg Chest Port 1 View  Result Date: 01/02/2018 CLINICAL DATA:  Shortness of breath.  Non-Hodgkin's lymphoma. EXAM: PORTABLE CHEST 1 VIEW COMPARISON:  Dec 19, 2017 FINDINGS: Increased infiltrate throughout the left mid and upper lung. Increased infiltrate diffusely throughout the right lung. Increased pleural thickening or fluid at the right apex. The cardiomediastinal silhouette is stable. A Port-A-Cath is in stable position. IMPRESSION: 1. New infiltrates throughout the upper half of the left lung and diffusely throughout the right lung. New thickening  towards  the right apex could represent pleural thickening versus pleural fluid. Electronically Signed   By: Dorise Bullion III M.D   On: 01/02/2018 18:41   US Abdomen Limited Ruq  Result Date: 01/02/2018 CLINICAL DATA:  Right upper quadrant pain EXAM: ULTRASOUND ABDOMEN LIMITED RIGHT UPPER QUADRANT COMPARISON:  CT 12/19/2017 FINDINGS: Gallbladder: No gallstones or wall thickening visualized. No sonographic Murphy sign noted by sonographer. 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 Portal vein is patent on color Doppler imaging with normal direction of blood flow towards the liver. Right pleural effusion. IMPRESSION: Small hyperechoic foci within the right hepatic lobe most compatible with small hemangiomas. Right pleural effusion. Electronically Signed   By: Rolm Baptise M.D.   On: 01/02/2018 15:17   ASSESSMENT AND PLAN:   *Acute on chronic hypoxic respiratory failure secondary to bilateral pneumonia over chronic pulmonary fibrosis and radiation pneumonitis Patient continues to be extremely short of breath.  Wean oxygen as tolerated.  On broad-spectrum IV antibiotics. Steroids and bronchodilator nebulizers. Pulmonary following.  *Anxiety.  Xanax as needed.  *Non-Hodgkin's lymphoma with recurrence.  Currently on chemotherapy.  Follows with [facility].  *Intermittent right upper quadrant pain.  Ultrasound showed no gallstones or issues with gallbladder.  Discussed with Dr. Rosana Hoes of surgery who saw the patient in the past..  Pain has resolved.  DVT prophylaxis with Lovenox  All the records are reviewed and case discussed with Care Management/Social Worker Management plans discussed with the patient, family and they are in agreement.  CODE STATUS: FULL CODE  DVT Prophylaxis: SCDs  TOTAL TIME TAKING CARE OF THIS PATIENT: 35 minutes.   POSSIBLE D/C IN 2-3  DAYS, DEPENDING ON CLINICAL CONDITION.  Neita Carp M.D on 01/03/2018 at 12:42 PM  Between 7am to 6pm - Pager - [phone]  After 6pm go to [email or web address] - password [email or web address] [facility]  Office  [phone]  CC: Primary care physician; Baxter Hire, MD  Note: This dictation was prepared with Dragon dictation along with smaller phrase technology. Any transcriptional errors that result from this process are unintentional.

## 2018-01-03 NOTE — Care Management (Signed)
CM consult for home health. Team will follow for discharge needs.

## 2018-01-03 NOTE — Progress Notes (Addendum)
Patient increasingly SOB, respirations 28BPM, patient states it's more difficult for her to breathe, BP 129/79, HR 109, 02 98% on 3L, oral temp. 97.8. Received verbal order for 2 view chest x ray stat by Dr. Darvin Neighbours.

## 2018-01-03 NOTE — Progress Notes (Signed)
Patient wheezing, complaining of SOB, respirations 32BPM, administered prn xanax 0.5mg , notified Dr. Darvin Neighbours.

## 2018-01-03 NOTE — Progress Notes (Signed)
Notified Dr. Darvin Neighbours of patient requesting to leave AMA.

## 2018-01-03 NOTE — Progress Notes (Signed)
Pharmacy Antibiotic Note  Anna Mcbride is a 72 y.o. female admitted on 01/02/2018 with pneumonia.  Pharmacy has been consulted for vancomycin and Zosyn dosing. She has a known history of chronic hypoxic respiratory failure living on 2 L of oxygen, non-Hodgkin's lymphoma with recurrence seeing Dr. Rogue Bussing as an outpatient, on chemotherapy, seeing Dr. Ashby Dawes and Dr. Mortimer Fries for history of chronic pulmonary infiltrates from the previous radiation treatment was seen by primary care physician and patient was placed on Z-Pak and prednisone tapering dose with no clinical improvement.  Patient was desaturating on today's primary care physician follow-up with increased shortness of breath and patient is sent over to the hospital as a direct admit. She was originally thought not to have PNA but CXR revealed pulmonary infiltrates.   Plan: Will change vancomycin from 750 mg iv q 24 hours to 750 mg iv q 36 hours with bump in SCr. Will discuss d/c vancomycin with MD if MRSA PCR is negative.   Height: 5\' 1"  (154.9 cm) Weight: 116 lb 10 oz (52.9 kg) IBW/kg (Calculated) : 47.8  Temp (24hrs), Avg:97.7 F (36.5 C), Min:97.5 F (36.4 C), Max:98.1 F (36.7 C)  Recent Labs  Lab 01/02/18 1158 01/03/18 0806  WBC 11.1*  --   CREATININE 0.96 1.41*    Estimated Creatinine Clearance: 26.4 mL/min (A) (by C-G formula based on SCr of 1.41 mg/dL (H)).    Allergies  Allergen Reactions  . Hydrocodone-Homatropine Itching  . Tussionex Pennkinetic Er [Hydrocod Polst-Cpm Polst Er] Itching  . Ciprofloxacin Rash    Antimicrobials this admission: Azithromycin 6/14 Vancomycin 6/14>> Zosyn 6/14 >>   Microbiology results: 6/14 Sputum: sent 6/15 MRSA PCR: sent  Thank you for allowing pharmacy to be a part of this patient's care.  Napoleon Form, PharmD 01/03/2018 3:42 PM

## 2018-01-03 NOTE — Consult Note (Signed)
[place]  [phone] [phone]  ID: Anna Mcbride OB: 08-15-1945  MR#: [id number]  CHE#:[id number]  Patient Care Team: Baxter Hire, MD as PCP - General (Internal Medicine) Madelyn Brunner, MD as Referring Physician (Unknown Physician Specialty) Christene Lye, MD (General Surgery) Cammie Sickle, MD as Medical Oncologist (Hematology and Oncology) Flora Lipps, MD as Consulting Physician (Pulmonary Disease)  CHIEF COMPLAINT: History of recurrent large cell lymphoma in remission now with worsening shortness of breath.  INTERVAL HISTORY: Patient is a 72 year old female with a history of recurrent large cell lymphoma.  She was last treated in 2015 and remains in a complete remission.  She admitted to the hospital with worsening shortness of breath and now is being transferred to the ICU.  Patient in obvious discomfort upon evaluation with difficulty formulating full sentences.  She has no neurologic complaints.  She denies any fevers.  She denies any chest pain.  She has a poor appetite.  She denies any nausea, vomiting, constipation, or diarrhea.  She has no urinary complaints.  Patient feels generally terrible, but offers no further specific complaints.  REVIEW OF SYSTEMS:   Review of Systems  Constitutional: Positive for malaise/fatigue. Negative for fever and weight loss.  Respiratory: Positive for shortness of breath. Negative for cough.   Cardiovascular: Negative.  Negative for chest pain and leg swelling.  Gastrointestinal: Negative.  Negative for abdominal pain.  Genitourinary: Negative.  Negative for dysuria.  Musculoskeletal: Negative.  Negative for back pain.  Skin: Negative.  Negative for rash.  Neurological: Negative.  Negative for dizziness, focal weakness, weakness and headaches.  Psychiatric/Behavioral: The patient is nervous/anxious.     As per HPI. Otherwise, a complete review of systems is negative.  PAST  MEDICAL HISTORY: Past Medical History:  Diagnosis Date  . Acquired hypothyroidism 07/01/2013  . Acute respiratory failure ([place]) 11/29/2014  . 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 Asthma   . Back pain with sciatica 05/07/2017  . Biliary sludge 12/19/2017  . Lymphoma, non-Hodgkin's ([place]) 06/30/2013  . Non Hodgkin's lymphoma ([place]) [id number]  . Personal history of chemotherapy   . Recurrent upper respiratory infection (URI) 09/01/2015  . 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 NASAL SINUS SURGERY  08/07/05 09/09/05   x 2   . VIDEO BRONCHOSCOPY Bilateral 11/30/2014   Procedure: VIDEO BRONCHOSCOPY WITHOUT FLUORO;  Surgeon: Flora Lipps, MD;  Location: [facility] ORS;  Service: Cardiopulmonary;  Laterality: Bilateral;    FAMILY HISTORY: Family History  Problem Relation Age of Onset  . Asthma Son   . Congestive Heart Failure Mother   . Diabetes Brother   . Pulmonary embolism Father   . Breast cancer Neg Hx     ADVANCED DIRECTIVES (Y/N):  @ADVDIR @  HEALTH MAINTENANCE: Social History   Tobacco Use  . Smoking status: Never Smoker  . Smokeless tobacco: Never Used  Substance Use Topics  . Alcohol use: Not Currently    Frequency: Never    Comment: a couple glasses of wine a year  . Drug use: No     Colonoscopy:  PAP:  Bone density:  Lipid panel:  Allergies  Allergen Reactions  . Hydrocodone-Homatropine Itching  . Tussionex Pennkinetic Er [Hydrocod Polst-Cpm Polst Er] Itching  . Ciprofloxacin Rash    Current Facility-Administered Medications  Medication Dose Route Frequency Provider Last Rate Last Dose  .  albuterol (PROVENTIL) (2.5 MG/3ML) 0.083% nebulizer solution 2.5 mg  2.5 mg Nebulization Q4H PRN Gouru, Aruna, MD      . ALPRAZolam Duanne Moron) tablet 0.5 mg  0.5 mg Oral Q6H PRN Sudini, Srikar, MD      . arformoterol (BROVANA) nebulizer solution 15 mcg  15  mcg Nebulization Q12H [name], MD   15 mcg at 01/03/18 0802  . aspirin EC tablet 81 mg  81 mg Oral Daily Gouru, Aruna, MD   81 mg at 01/03/18 0903  . budesonide (PULMICORT) nebulizer solution 0.5 mg  0.5 mg Nebulization BID Gouru, Aruna, MD   0.5 mg at 01/03/18 0820  . citalopram (CELEXA) tablet 20 mg  20 mg Oral Daily Gouru, Aruna, MD   20 mg at 01/03/18 0904  . dexmedetomidine (PRECEDEX) 400 MCG/100ML (4 mcg/mL) infusion  0.4-1.2 mcg/kg/hr Intravenous Titrated Conforti, John, DO 5.3 mL/hr at 01/03/18 1702 0.4 mcg/kg/hr at 01/03/18 1702  . enoxaparin (LOVENOX) injection 30 mg  30 mg Subcutaneous Daily Gouru, Aruna, MD   30 mg at 01/03/18 0906  . famotidine (PEPCID) tablet 20 mg  20 mg Oral Daily Gouru, Aruna, MD   20 mg at 01/03/18 0904  . [START ON 01/04/2018] furosemide (LASIX) tablet 20 mg  20 mg Oral Daily Sudini, Srikar, MD      . ipratropium-albuterol (DUONEB) 0.5-2.5 (3) MG/3ML nebulizer solution 3 mL  3 mL Nebulization Q6H Gouru, Aruna, MD   3 mL at 01/03/18 1343  . methylPREDNISolone sodium succinate (SOLU-MEDROL) 125 mg/2 mL injection 60 mg  60 mg Intravenous Q6H Gouru, Aruna, MD   60 mg at 01/03/18 1300  . nystatin (MYCOSTATIN) 100000 UNIT/ML suspension 500,000 Units  5 mL Oral QID Hillary Bow, MD   500,000 Units at 01/03/18 1429  . ondansetron (ZOFRAN) 4 MG/2ML injection           . ondansetron (ZOFRAN) injection 4 mg  4 mg Intravenous Q6H PRN Conforti, John, DO      . ondansetron (ZOFRAN) tablet 4 mg  4 mg Oral Q8H PRN Gouru, Aruna, MD   4 mg at 01/03/18 0701  . pantoprazole (PROTONIX) EC tablet 40 mg  40 mg Oral Daily Gouru, Aruna, MD   40 mg at 01/03/18 0904  . piperacillin-tazobactam (ZOSYN) IVPB 3.375 g  3.375 g Intravenous Q8H Gouru, Aruna, MD 12.5 mL/hr at 01/03/18 1421 3.375 g at 01/03/18 1421  . potassium chloride SA (K-DUR,KLOR-CON) CR tablet 20 mEq  20 mEq Oral Daily Gouru, Aruna, MD   20 mEq at 01/03/18 0904  . traMADol (ULTRAM) tablet 50 mg  50 mg Oral Q6H PRN Gouru,  Illene Silver, MD      . Derrill Memo ON 01/05/2018] vancomycin (VANCOCIN) IVPB 750 mg/150 ml premix  750 mg Intravenous Q36H Sudini, Alveta Heimlich, MD      . vitamin C (ASCORBIC ACID) tablet 500 mg  500 mg Oral Daily Gouru, Aruna, MD       Facility-Administered Medications Ordered in Other Encounters  Medication Dose Route Frequency Provider Last Rate Last Dose  . heparin lock flush 100 unit/mL  500 Units Intravenous Once Charlaine Dalton R, MD      . sodium chloride flush (NS) 0.9 % injection 10 mL  10 mL Intravenous Once Cammie Sickle, MD        OBJECTIVE: Vitals:   01/03/18 1449 01/03/18 1644  BP: 129/79 (!) 173/104  Pulse: (!) 109 (!) 110  Resp: (!) 29 (!) 25  Temp: 97.8 F (36.6 C) (!) 97.3 F (  36.3 C)  SpO2: 97% 91%     Body mass index is 22.58 kg/m.    ECOG FS:4 - Bedbound  General: Ill-appearing, moderate respiratory distress.   Eyes: Pink conjunctiva, anicteric sclera. Lungs: Diminished breath sounds. Heart: Regular rate and rhythm. No rubs, murmurs, or gallops. Abdomen: Soft, nontender, nondistended. No organomegaly noted, normoactive bowel sounds. Musculoskeletal: No edema, cyanosis, or clubbing. Neuro: Alert, answering all questions appropriately. Cranial nerves grossly intact. Skin: No rashes or petechiae noted. Psych: Anxious.  LAB RESULTS:  Lab Results  Component Value Date   NA 137 01/03/2018   K 3.7 01/03/2018   CL 102 01/03/2018   CO2 24 01/03/2018   GLUCOSE 135 (H) 01/03/2018   BUN 46 (H) 01/03/2018   CREATININE 1.41 (H) 01/03/2018   CALCIUM 8.4 (L) 01/03/2018   PROT 6.8 01/02/2018   ALBUMIN 3.0 (L) 01/02/2018   AST 47 (H) 01/02/2018   ALT 50 01/02/2018   ALKPHOS 67 01/02/2018   BILITOT 0.9 01/02/2018   GFRNONAA 36 (L) 01/03/2018   GFRAA 41 (L) 01/03/2018    Lab Results  Component Value Date   WBC 11.1 (H) 01/02/2018   NEUTROABS 2.4 10/27/2017   HGB 12.2 01/02/2018   HCT 36.7 01/02/2018   MCV 98.1 01/02/2018   PLT 226 01/02/2018      STUDIES: Dg Chest 2 View  Result Date: 01/03/2018 CLINICAL DATA:  Worsening shortness of breath EXAM: CHEST - 2 VIEW COMPARISON:  01/02/2018 FINDINGS: Left Port-A-Cath remains in place, unchanged. Right apical pleural thickening again noted, stable. Bilateral airspace opacities again noted, throughout much of the right lung and in the left mid and upper lung. These have worsened slightly since prior study. Probable small effusions. No acute bony abnormality. IMPRESSION: Bilateral airspace opacities have worsened since prior study concerning for worsening pneumonia. Small bilateral effusions. Electronically Signed   By: Rolm Baptise M.D.   On: 01/03/2018 15:41   Dg Chest 2 View  Result Date: 12/17/2017 CLINICAL DATA:  Shortness of breath. History of interstitial lung disease and non-Hodgkin's lymphoma EXAM: CHEST - 2 VIEW COMPARISON:  May 29, 2017 and Nov 26, 2016 FINDINGS: There is fibrosis in both upper lobes with scarring in the right upper lobe. There is also scarring in the right base region. There is moderate volume loss on the right. There is subtle increased opacity in the left mid lung compared to prior studies, concerning for potential early pneumonia in this area. No other new opacity evident. Heart size is normal. There is retraction in both hilar regions with distortion of pulmonary vascularity bilaterally, stable. No adenopathy is evident by radiography. Port-A-Cath tip is in the superior vena cava. No pneumothorax. No blastic or lytic bone lesions. There is an old healed fracture of the sternum. IMPRESSION: Subtle increased opacity in the left mid lung which may represent early pneumonia. There is increased opacity in this area compared to prior studies. Elsewhere there is extensive scarring, more severe on the right than on the left, which is stable. Moderate volume loss on the left. Stable cardiac silhouette. No adenopathy is appreciable on this study. Electronically Signed   By:  Lowella Grip III M.D.   On: 12/17/2017 08:33   Ct Angio Chest Pe W Or Wo Contrast  Result Date: 12/18/2017 CLINICAL DATA:  Lymphoma.  Worsening breathing. EXAM: CT ANGIOGRAPHY CHEST WITH CONTRAST TECHNIQUE: Multidetector CT imaging of the chest was performed using the standard protocol during bolus administration of intravenous contrast. Multiplanar CT image reconstructions and MIPs  were obtained to evaluate the vascular anatomy. CONTRAST:  25mL ISOVUE-370 IOPAMIDOL (ISOVUE-370) INJECTION 76% COMPARISON:  06/18/2016 FINDINGS: Cardiovascular: Preferential opacification of the thoracic aorta. No evidence of thoracic aortic aneurysm or dissection. Normal heart size. No pericardial effusion. No pericardial effusion. Mediastinum/Nodes: Small scattered mediastinal nodes. Stable left subclavian Port-A-Cath device. Lungs/Pleura: Chronic pleural and parenchymal changes at the right apex are stable. The previously visualized right pneumothorax has resolved. Patchy ground-glass opacities throughout the left lung have markedly improved. Bronchiectasis in the lingula is present and not significantly changed. No new dominant pulmonary parenchymal mass. Upper Abdomen: Central right lobe liver cyst is stable. Musculoskeletal: No vertebral compression deformity. There is a healing fracture of the sternum with deformity. Review of the MIP images confirms the above findings. IMPRESSION: No evidence of acute pulmonary thromboembolism. Narrowing of the left subclavian and innominate arteries as described. Stable pleural and parenchymal changes at the right apex related to the patient's known lymphoma. Improved ground-glass opacities throughout the left lung is supporting inflammatory etiology. Aortic Atherosclerosis (ICD10-I70.0). Electronically Signed   By: Marybelle Killings M.D.   On: 12/18/2017 14:08   Ct Abdomen Pelvis W Contrast  Result Date: 12/19/2017 CLINICAL DATA:  Nausea and vomiting. EXAM: CT ABDOMEN AND PELVIS WITH  CONTRAST TECHNIQUE: Multidetector CT imaging of the abdomen and pelvis was performed using the standard protocol following bolus administration of intravenous contrast. CONTRAST:  52mL ISOVUE-300 IOPAMIDOL (ISOVUE-300) INJECTION 61% COMPARISON:  February 18, 2017 FINDINGS: Lower chest: No acute abnormality. Hepatobiliary: Periportal edema seen in the liver. A hepatic cyst is identified. High attenuation material seen in the gallbladder. The portal vein is patent. Pancreas: Unremarkable. No pancreatic ductal dilatation or surrounding inflammatory changes. Spleen: Normal in size without focal abnormality. Adrenals/Urinary Tract: Adrenal glands are normal. Parapelvic cysts on the left. The kidneys are otherwise unremarkable. Contrast in the bladder is likely due to the CT of the chest from yesterday. No ureteral stones or obstruction identified. Stomach/Bowel: The stomach and small bowel are normal. Colonic diverticulosis is seen without diverticulitis. The cecum lies deep within the pelvis. The appendix is normal. Fecal loading identified. Vascular/Lymphatic: Atherosclerotic changes are seen in the nonaneurysmal aorta. No adenopathy. Reproductive: Uterus and bilateral adnexa are unremarkable. Other: No abdominal wall hernia or abnormality. No abdominopelvic ascites. Musculoskeletal: No acute or significant osseous findings. IMPRESSION: 1. No acute abnormalities identified. 2. Periportal edema in the liver is nonspecific. 3. High attenuation the gallbladder could represent sludge or stones. No wall thickening. 4. Colonic diverticulosis without diverticulitis. 5. Atherosclerotic changes in the nonaneurysmal aorta. Electronically Signed   By: Dorise Bullion III M.D   On: 12/19/2017 21:27   Dg Chest Port 1 View  Result Date: 01/02/2018 CLINICAL DATA:  Shortness of breath.  Non-Hodgkin's lymphoma. EXAM: PORTABLE CHEST 1 VIEW COMPARISON:  Dec 19, 2017 FINDINGS: Increased infiltrate throughout the left mid and upper lung.  Increased infiltrate diffusely throughout the right lung. Increased pleural thickening or fluid at the right apex. The cardiomediastinal silhouette is stable. A Port-A-Cath is in stable position. IMPRESSION: 1. New infiltrates throughout the upper half of the left lung and diffusely throughout the right lung. New thickening towards the right apex could represent pleural thickening versus pleural fluid. Electronically Signed   By: Dorise Bullion III M.D   On: 01/02/2018 18:41   Dg Chest Portable 1 View  Result Date: 12/19/2017 CLINICAL DATA:  Chest pain EXAM: PORTABLE CHEST 1 VIEW COMPARISON:  12/16/17 FINDINGS: Cardiac shadow is stable. Left chest wall port is noted.  Chronic changes in the apices are again seen. No focal infiltrate or sizable effusion is noted. The previously seen changes on the left are not well appreciated on this exam. No acute bony abnormality is noted. IMPRESSION: No acute abnormality seen. Chronic changes in the apices are again seen. Electronically Signed   By: Inez Catalina M.D.   On: 12/19/2017 18:58   Mm 3d Screen Breast Bilateral  Result Date: 12/09/2017 CLINICAL DATA:  Screening. EXAM: DIGITAL SCREENING BILATERAL MAMMOGRAM WITH TOMO AND CAD COMPARISON:  Previous exam(s). ACR Breast Density Category c: The breast tissue is heterogeneously dense, which may obscure small masses. FINDINGS: There are no findings suspicious for malignancy. Images were processed with CAD. IMPRESSION: No mammographic evidence of malignancy. A result letter of this screening mammogram will be mailed directly to the patient. RECOMMENDATION: Screening mammogram in one year. (Code:SM-B-01Y) BI-RADS CATEGORY  1: Negative. Electronically Signed   By: Fidela Salisbury M.D.   On: 12/09/2017 14:37   US Abdomen Limited Ruq  Result Date: 01/02/2018 CLINICAL DATA:  Right upper quadrant pain EXAM: ULTRASOUND ABDOMEN LIMITED RIGHT UPPER QUADRANT COMPARISON:  CT 12/19/2017 FINDINGS: Gallbladder: No gallstones or  wall thickening visualized. No sonographic Murphy sign noted by sonographer. 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 Portal vein is patent on color Doppler imaging with normal direction of blood flow towards the liver. Right pleural effusion. IMPRESSION: Small hyperechoic foci within the right hepatic lobe most compatible with small hemangiomas. Right pleural effusion. Electronically Signed   By: Rolm Baptise M.D.   On: 01/02/2018 15:17    ASSESSMENT: History of recurrent large cell lymphoma in remission now with worsening shortness of breath.  PLAN:    1.  History of recurrent large cell lymphoma: Patient was initially treated chemotherapy and XRT completing in November 2000.  Patient noted to have progressive disease in May 2001 and then underwent high-dose chemotherapy followed by transplant.  Patient had recurrence in October 2014 and again underwent high-dose chemotherapy.  A second transplant was planned, but never occurred since there are no stem cells to harvest.  PET scan in May 2016 revealed no evidence of disease.  By report, she has been in remission since this time.  No intervention is needed.  Keep follow-up in the cancer center as scheduled. 2.  Shortness of breath: Multifactorial.  Appreciate pulmonary input.  Patient being transferred to the ICU.  Appreciate consult, will follow.  Lloyd Huger, MD   01/03/2018 5:16 PM

## 2018-01-03 NOTE — Plan of Care (Signed)
  Problem: Education: Goal: Knowledge of General Education information will improve 01/03/2018 1955 by Herbie Baltimore, RN Outcome: Not Progressing 01/03/2018 1953 by Herbie Baltimore, RN Outcome: Progressing   Problem: Health Behavior/Discharge Planning: Goal: Ability to manage health-related needs will improve 01/03/2018 1955 by Herbie Baltimore, RN Outcome: Not Progressing 01/03/2018 1953 by Herbie Baltimore, RN Outcome: Progressing   Problem: Clinical Measurements: Goal: Ability to maintain clinical measurements within normal limits will improve 01/03/2018 1955 by Herbie Baltimore, RN Outcome: Not Progressing 01/03/2018 1953 by Herbie Baltimore, RN Outcome: Progressing Goal: Will remain free from infection 01/03/2018 1955 by Herbie Baltimore, RN Outcome: Not Progressing 01/03/2018 1953 by Herbie Baltimore, RN Outcome: Progressing Goal: Diagnostic test results will improve 01/03/2018 1955 by Herbie Baltimore, RN Outcome: Not Progressing 01/03/2018 1953 by Herbie Baltimore, RN Outcome: Progressing Goal: Respiratory complications will improve 01/03/2018 1955 by Herbie Baltimore, RN Outcome: Not Progressing 01/03/2018 1953 by Herbie Baltimore, RN Outcome: Progressing Goal: Cardiovascular complication will be avoided 01/03/2018 1955 by Herbie Baltimore, RN Outcome: Not Progressing 01/03/2018 1953 by Herbie Baltimore, RN Outcome: Progressing   Problem: Activity: Goal: Risk for activity intolerance will decrease 01/03/2018 1955 by Herbie Baltimore, RN Outcome: Not Progressing 01/03/2018 1953 by Herbie Baltimore, RN Outcome: Progressing   Problem: Nutrition: Goal: Adequate nutrition will be maintained 01/03/2018 1955 by Herbie Baltimore, RN Outcome: Not Progressing 01/03/2018 1953 by Herbie Baltimore, RN Outcome: Progressing   Problem: Coping: Goal: Level of anxiety will decrease 01/03/2018 1955 by Herbie Baltimore, RN Outcome: Not Progressing 01/03/2018 1953 by Herbie Baltimore, RN Outcome: Progressing   Problem: Elimination: Goal: Will not experience complications related to bowel motility 01/03/2018 1955 by Herbie Baltimore, RN Outcome: Not Progressing 01/03/2018 1953 by Herbie Baltimore, RN Outcome: Progressing Goal: Will not experience complications related to urinary retention 01/03/2018 1955 by Herbie Baltimore, RN Outcome: Not Progressing 01/03/2018 1953 by Herbie Baltimore, RN Outcome: Progressing   Problem: Pain Managment: Goal: General experience of comfort will improve 01/03/2018 1955 by Herbie Baltimore, RN Outcome: Not Progressing 01/03/2018 1953 by Herbie Baltimore, RN Outcome: Progressing   Problem: Safety: Goal: Ability to remain free from injury will improve 01/03/2018 1955 by Herbie Baltimore, RN Outcome: Not Progressing 01/03/2018 1953 by Herbie Baltimore, RN Outcome: Progressing   Problem: Skin Integrity: Goal: Risk for impaired skin integrity will decrease 01/03/2018 1955 by Herbie Baltimore, RN Outcome: Not Progressing 01/03/2018 1953 by Herbie Baltimore, RN Outcome: Progressing
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
## 2018-01-04 ENCOUNTER — Inpatient Hospital Stay
Admission: AD | Admit: 2018-01-04 | Discharge: 2018-01-04 | Disposition: A | Discharge status: Home / Self Care | Payer: Medicare Other | Source: Ambulatory Visit | Attending: Internal Medicine | Admitting: Internal Medicine

## 2018-01-04 LAB — CBC
HEMATOCRIT: 33.5 % — AB (ref 35.0–47.0)
Hemoglobin: 11.3 g/dL — ABNORMAL LOW (ref 12.0–16.0)
MCH: 33.4 pg (ref 26.0–34.0)
MCHC: 33.7 g/dL (ref 32.0–36.0)
MCV: 99 fL (ref 80.0–100.0)
PLATELETS: 168 10*3/uL (ref 150–440)
RBC: 3.38 MIL/uL — ABNORMAL LOW (ref 3.80–5.20)
RDW: 14.4 % (ref 11.5–14.5)
WBC: 5.5 10*3/uL (ref 3.6–11.0)

## 2018-01-04 LAB — MAGNESIUM: Magnesium: 2.6 mg/dL — ABNORMAL HIGH (ref 1.7–2.4)

## 2018-01-04 LAB — BASIC METABOLIC PANEL
ANION GAP: 9 (ref 5–15)
BUN: 55 mg/dL — ABNORMAL HIGH (ref 6–20)
CALCIUM: 8.2 mg/dL — AB (ref 8.9–10.3)
CO2: 28 mmol/L (ref 22–32)
CREATININE: 1.26 mg/dL — AB (ref 0.44–1.00)
Chloride: 103 mmol/L (ref 101–111)
GFR, EST AFRICAN AMERICAN: 47 mL/min — AB (ref >60)
GFR, EST NON AFRICAN AMERICAN: 41 mL/min — AB (ref >60)
Glucose, Bld: 140 mg/dL — ABNORMAL HIGH (ref 65–99)
Potassium: 3.3 mmol/L — ABNORMAL LOW (ref 3.5–5.1)
Sodium: 140 mmol/L (ref 135–145)

## 2018-01-04 LAB — PHOSPHORUS: PHOSPHORUS: 4.9 mg/dL — AB (ref 2.5–4.6)

## 2018-01-04 MED ORDER — ENSURE ENLIVE PO LIQD
237.0000 mL | Freq: Three times a day (TID) | ORAL | Status: DC
  Administered 2018-01-04 – 2018-01-07 (×8): 237 mL via ORAL

## 2018-01-04 MED ORDER — ADULT MULTIVITAMIN W/MINERALS CH
1.0000 | ORAL_TABLET | Freq: Every day | ORAL | Status: DC
  Administered 2018-01-05 – 2018-01-08 (×4): 1 via ORAL
  Filled 2018-01-04 (×4): qty 1

## 2018-01-04 MED ORDER — LEVOTHYROXINE SODIUM 25 MCG PO TABS
25.0000 ug | ORAL_TABLET | Freq: Every day | ORAL | Status: DC
  Administered 2018-01-05 – 2018-01-08 (×4): 25 ug via ORAL
  Filled 2018-01-04 (×4): qty 1

## 2018-01-04 NOTE — Progress Notes (Signed)
NP saw patient and will stop Precedex at this time so patient can wake up. Patient wakes easily and is alert and oriented.

## 2018-01-04 NOTE — Progress Notes (Signed)
*  PRELIMINARY RESULTS* Echocardiogram 2D Echocardiogram has been performed.  Anna Mcbride Clarance Bollard 01/04/2018, 2:26 PM

## 2018-01-04 NOTE — Progress Notes (Signed)
Patient has been asleep most of the shift. Continue on Precedex gtt at 0.5 mcg. When patient awakes she is calm and not anxious. Family was at bedside all evening and left before midnight. No indications of pain or discomfort. Continue to monitor.

## 2018-01-04 NOTE — Progress Notes (Signed)
[place] [facility] NAME: Anna Mcbride    MR#:  [id number]  DATE OF BIRTH:  08-16-1945  SUBJECTIVE:  CHIEF COMPLAINT:  No chief complaint on file.  Still has SOB. Was on precedex drip overnight for anxiety.  Still has cough. 3 L O2 Hypotensive   REVIEW OF SYSTEMS:    Review of Systems  Constitutional: Positive for diaphoresis and malaise/fatigue. Negative for chills and fever.  HENT: Negative for sore throat.   Eyes: Negative for blurred vision, double vision and pain.  Respiratory: Positive for cough, shortness of breath and wheezing. Negative for hemoptysis and sputum production.   Cardiovascular: Negative for chest pain, palpitations, orthopnea and leg swelling.  Gastrointestinal: Negative for abdominal pain, constipation, diarrhea, heartburn, nausea and vomiting.  Genitourinary: Negative for dysuria and hematuria.  Musculoskeletal: Negative for back pain and joint pain.  Skin: Negative for rash.  Neurological: Negative for sensory change, speech change, focal weakness and headaches.  Endo/Heme/Allergies: Does not bruise/bleed easily.  Psychiatric/Behavioral: Negative for depression. The patient is not nervous/anxious.     DRUG ALLERGIES:   Allergies  Allergen Reactions  . Hydrocodone-Homatropine Itching  . Tussionex Pennkinetic Er [Hydrocod Polst-Cpm Polst Er] Itching  . Ciprofloxacin Rash    VITALS:  Blood pressure (!) 76/55, pulse 91, temperature 97.8 F (36.6 C), temperature source Axillary, resp. rate (!) 24, height 5\' 1"  (1.549 m), weight 54.2 kg (119 lb 7.8 oz), SpO2 96 %.  PHYSICAL EXAMINATION:   Physical Exam  GENERAL:  72 y.o.-year-old patient lying in the bed with resp distress EYES: Pupils equal, round, reactive to light and accommodation. No scleral icterus. Extraocular muscles intact.  HEENT: Head atraumatic, normocephalic. Oropharynx and nasopharynx clear.  NECK:  Supple, no jugular venous distention. No  thyroid enlargement, no tenderness.  LUNGS: Increased Work of breathing.  Bilateral wheezing and coarse breath sounds. CARDIOVASCULAR: S1, S2 normal. No murmurs, rubs, or gallops.  ABDOMEN: Soft, nontender, nondistended. Bowel sounds present. No organomegaly or mass.  EXTREMITIES: No cyanosis, clubbing or edema b/l.    NEUROLOGIC: Cranial nerves II through XII are intact. No focal Motor or sensory deficits b/l.   PSYCHIATRIC: The patient is alert and awake.  Anxious SKIN: No obvious rash, lesion, or ulcer.   LABORATORY PANEL:   CBC Recent Labs  Lab 01/04/18 0510  WBC 5.5  HGB 11.3*  HCT 33.5*  PLT 168   ------------------------------------------------------------------------------------------------------------------ Chemistries  Recent Labs  Lab 01/02/18 1158  01/04/18 0510  NA 139   < > 140  K 4.3   < > 3.3*  CL 104   < > 103  CO2 24   < > 28  GLUCOSE 113*   < > 140*  BUN 45*   < > 55*  CREATININE 0.96   < > 1.26*  CALCIUM 9.1   < > 8.2*  MG  --   --  2.6*  AST 47*  --   --   ALT 50  --   --   ALKPHOS 67  --   --   BILITOT 0.9  --   --    < > = values in this interval not displayed.   ------------------------------------------------------------------------------------------------------------------  Cardiac Enzymes No results for input(s): TROPONINI in the last 168 hours. ------------------------------------------------------------------------------------------------------------------  RADIOLOGY:  Dg Chest 2 View  Result Date: 01/03/2018 CLINICAL DATA:  Worsening shortness of breath EXAM: CHEST - 2 VIEW COMPARISON:  01/02/2018 FINDINGS: Left Port-A-Cath remains in place, unchanged. Right apical  pleural thickening again noted, stable. Bilateral airspace opacities again noted, throughout much of the right lung and in the left mid and upper lung. These have worsened slightly since prior study. Probable small effusions. No acute bony abnormality. IMPRESSION: Bilateral  airspace opacities have worsened since prior study concerning for worsening pneumonia. Small bilateral effusions. Electronically Signed   By: Rolm Baptise M.D.   On: 01/03/2018 15:41   Dg Chest Port 1 View  Result Date: 01/02/2018 CLINICAL DATA:  Shortness of breath.  Non-Hodgkin's lymphoma. EXAM: PORTABLE CHEST 1 VIEW COMPARISON:  Dec 19, 2017 FINDINGS: Increased infiltrate throughout the left mid and upper lung. Increased infiltrate diffusely throughout the right lung. Increased pleural thickening or fluid at the right apex. The cardiomediastinal silhouette is stable. A Port-A-Cath is in stable position. IMPRESSION: 1. New infiltrates throughout the upper half of the left lung and diffusely throughout the right lung. New thickening towards the right apex could represent pleural thickening versus pleural fluid. Electronically Signed   By: Dorise Bullion III M.D   On: 01/02/2018 18:41   US Abdomen Limited Ruq  Result Date: 01/02/2018 CLINICAL DATA:  Right upper quadrant pain EXAM: ULTRASOUND ABDOMEN LIMITED RIGHT UPPER QUADRANT COMPARISON:  CT 12/19/2017 FINDINGS: Gallbladder: No gallstones or wall thickening visualized. No sonographic Murphy sign noted by sonographer. 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 Portal vein is patent on color Doppler imaging with normal direction of blood flow towards the liver. Right pleural effusion. IMPRESSION: Small hyperechoic foci within the right hepatic lobe most compatible with small hemangiomas. Right pleural effusion. Electronically Signed   By: Rolm Baptise M.D.   On: 01/02/2018 15:17   ASSESSMENT AND PLAN:   *Acute on chronic hypoxic respiratory failure secondary to bilateral pneumonia over chronic pulmonary fibrosis and radiation pneumonitis Still has significant shortness of breath.   Broad-spectrum IV antibiotics. Steroids and  bronchodilator nebulizers. Appreciate pulmonary input  *Anxiety.  Xanax as needed.  *Non-Hodgkin's lymphoma in remission  *Intermittent right upper quadrant pain.  Ultrasound showed no gallstones or issues with gallbladder.  Discussed with Dr. Rosana Hoes of surgery who saw the patient in the past..  Pain has resolved.  DVT prophylaxis with Lovenox  All the records are reviewed and case discussed with Care Management/Social Worker Management plans discussed with the patient, family and they are in agreement.  CODE STATUS: FULL CODE  DVT Prophylaxis: SCDs  TOTAL TIME TAKING CARE OF THIS PATIENT: 35 minutes.   POSSIBLE D/C IN 2-3 DAYS, DEPENDING ON CLINICAL CONDITION.  Neita Carp M.D on 01/04/2018 at 11:18 AM  Between 7am to 6pm - Pager - [phone]  After 6pm go to [email or web address] - password [email or web address] [facility]  Office  [phone]  CC: Primary care physician; Baxter Hire, MD  Note: This dictation was prepared with Dragon dictation along with smaller phrase technology. Any transcriptional errors that result from this process are unintentional.
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
## 2018-01-04 NOTE — Progress Notes (Signed)
Initial Nutrition Assessment  DOCUMENTATION CODES:   Non-severe (moderate) malnutrition in context of chronic illness  INTERVENTION:  Provide Ensure Enlive po TID, each supplement provides 350 kcal and 20 grams of protein. Patient prefers vanilla.  Provide daily MVI.  Encouraged adequate intake of calories and protein from meals and oral nutrition supplements to prevent any further weight loss or loss of lean body mass.  NUTRITION DIAGNOSIS:   Moderate Malnutrition related to chronic illness(hx non-Hodgkin's lymphoma) as evidenced by moderate fat depletion, moderate muscle depletion.  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   Met with patient at bedside. She reports she has had a poor appetite for the past 2 weeks. It is related to decreased strength/weakness. She also was having issues with constipation, but after taking magnesium citrate at home she reports she is now having diarrhea. She attempts to eat 3 meals daily at home. She can no longer cook so her husband has been cooking. For breakfast she usually has cereal with strawberries or blueberries. Lunch may be a small sandwich but she occasionally skips lunch. Dinner is usually a meat with vegetables, but she is having a hard time eating meat now. She can eat meat when it is  shredded, but otherwise does not eat anymore. RD offered to provide chopped meat on trays, but patient refuses at this time. She is amenable to drinking Ensure to help meet calorie/protein needs.  UBW recently around 122 lbs. Per chart she was 122.6 lbs on 06/27/2017. She has lost 3.1 lbs (2.5% body weight) some time in the past 6 months. Patient reports the weight loss has likely been over the past 2 weeks.  Medications reviewed and include: famotidine, Lasix 20 mg daily PO, Solu-Medrol 60 mg Q6hrs IV, pantoprazole, potassium chloride 20 mEq daily PO, vitamin C 500 mg daily PO, Precedex gtt now off, Zosyn, vancomycin.  Labs reviewed: Potassium 3.3, BUN 55 (trending up from yesterday), Creatinine 1.26 (trending down from yesterday), Phosphorus 4.9, Magnesium 2.6. BNP 1133 on 6/15.  Discussed with RN.  NUTRITION - FOCUSED PHYSICAL EXAM:    Most Recent Value  Orbital Region  Moderate depletion  Upper Arm Region  Moderate depletion  Thoracic and Lumbar Region  Mild depletion  Buccal Region  Moderate depletion  Temple Region  Severe depletion  Clavicle Bone Region  Moderate depletion  Clavicle and Acromion Bone Region  Moderate depletion  Scapular Bone Region  Moderate depletion  Dorsal Hand  Severe depletion  Patellar Region  Moderate depletion  Anterior Thigh Region  Moderate depletion  Posterior Calf Region  Moderate depletion  Edema (RD Assessment)  None  Hair  Reviewed  Eyes  Reviewed  Mouth  Reviewed  Skin  Reviewed [ecchymosis]  Nails  Reviewed     Diet Order:   Diet Order           Diet regular Room service appropriate? Yes; Fluid consistency: Thin  Diet  effective now          EDUCATION NEEDS:   Education needs have been addressed  Skin:  Skin Assessment: Reviewed RN Assessment(ecchymosis to bilateral arms and legs)  Last BM:  01/03/2018 - medium type 7  Height:   Ht Readings from Last 1 Encounters:  01/03/18 '5\' 1"'$  (1.549 m)    Weight:   Wt Readings from  Last 1 Encounters:  01/03/18 119 lb 7.8 oz (54.2 kg)    Ideal Body Weight:  47.7 kg  BMI:  Body mass index is 22.58 kg/m.  Estimated Nutritional Needs:   Kcal:  1355-1630 (25-30 kcal/kg)  Protein:  70-80 grams (1.3-1.5 grams/kg)  Fluid:  1.3-1.6 L/day (25-30 mL/kg)  Willey Blade, MS, RD, LDN Office: [phone] Pager: [phone] After Hours/Weekend Pager: [phone]

## 2018-01-05 ENCOUNTER — Encounter: Admission: RE | Admit: 2018-01-05 | Payer: Medicare Other | Source: Ambulatory Visit | Admitting: Internal Medicine

## 2018-01-05 MED ORDER — METHYLPREDNISOLONE SODIUM SUCC 125 MG IJ SOLR: 60.0000 mg | Freq: Two times a day (BID) | INTRAMUSCULAR | Status: DC

## 2018-01-05 MED ORDER — ACETAMINOPHEN 325 MG PO TABS: 650.0000 mg | ORAL_TABLET | Freq: Four times a day (QID) | ORAL | Status: DC | PRN

## 2018-01-05 MED ORDER — POLYETHYLENE GLYCOL 3350 17 G PO PACK
17.0000 g | PACK | Freq: Every day | ORAL | Status: DC
  Administered 2018-01-05 – 2018-01-06 (×2): 17 g via ORAL
  Filled 2018-01-05 (×3): qty 1

## 2018-01-05 MED ORDER — METHYLPREDNISOLONE SODIUM SUCC 40 MG IJ SOLR
40.0000 mg | Freq: Every day | INTRAMUSCULAR | Status: DC
  Administered 2018-01-06 – 2018-01-07 (×2): 40 mg via INTRAVENOUS
  Filled 2018-01-05 (×2): qty 1

## 2018-01-05 NOTE — Evaluation (Signed)
Physical Therapy Evaluation Patient Details Name: Anna Mcbride MRN: [id number] DOB: [birth year] Today's Date: 01/05/2018   History of Present Illness  presented to ER secondary to worsening SOB, desaturation; admitted for management of bilat PNA, chronic pulmonary fibrosis, radiation pneumonitis and symptomatic cholelithiasis (pending surgery consult).  Clinical Impression  Upon evaluation, patient alert and oriented; follows commands and demonstrates good effort with all mobility tasks. Mildly dyspneic at rest, mildly anxious, but agreeable to participation with session as tolerated.  Bilat UE/LE generally weak and deconditioned (symmetrical), but functional for basic transfers and ambulation with RW.  Able to complete bed mobility with sup; sit/stand, basic transfers and very short-distance gait (5') with RW, min assist. Increased sway in all planes; unsafe to complete without RW and +1 assist at all times. Notable increase in dyspnea, RR (up to 32bpm) with minimal exertion (bed/chair); sats remain >95% on 3L throughout.  Additional distance/activity deferred due to fatigue, dyspnea. Would benefit from skilled PT to address above deficits and promote optimal return to PLOF; recommend transition to STR upon discharge from acute hospitalization.     Follow Up Recommendations SNF    Equipment Recommendations  Rolling walker with 5" wheels    Recommendations for Other Services       Precautions / Restrictions Restrictions Weight Bearing Restrictions: No      Mobility  Bed Mobility Overal bed mobility: Needs Assistance Bed Mobility: Supine to Sit     Supine to sit: Supervision        Transfers Overall transfer level: Needs assistance Equipment used: Rolling walker (2 wheeled) Transfers: Sit to/from Stand Sit to Stand: Min guard;Min assist         General transfer comment: requires UE support to initiate and complete lift off  Ambulation/Gait Ambulation/Gait assistance:  Min assist Gait Distance (Feet): 5 Feet Assistive device: Rolling walker (2 wheeled)       General Gait Details: very short, shuffling steps; increased sway in M/L planes.  Significant SOB with minimal exertion, though sats maintain >95% on 3L  Stairs            Wheelchair Mobility    Modified Rankin (Stroke Patients Only)       Balance Overall balance assessment: Needs assistance Sitting-balance support: No upper extremity supported;Feet supported Sitting balance-Leahy Scale: Good     Standing balance support: Bilateral upper extremity supported Standing balance-Leahy Scale: Fair                               Pertinent Vitals/Pain      Home Living Family/patient expects to be discharged to:: Private residence Living Arrangements: Spouse/significant other Available Help at Discharge: Family Type of Home: House Home Access: Stairs to enter Entrance Stairs-Rails: None Entrance Stairs-Number of Steps: 2 Home Layout: One level Home Equipment: [place] - single point;Shower seat - built in;Grab bars - tub/shower Additional Comments: Does have single step to access living and dining room of home environment; does not typically utilize (able to access kitchen, sunroom, bed and bathroom on primary level)    Prior Function Level of Independence: Independent         Comments: Indep with ADLs, household and limited community distances; home O2 at 3L (recently increased from 2L); does endorse at least 1-2 falls within previous six months.     Hand Dominance   Dominant Hand: Right    Extremity/Trunk Assessment   Upper Extremity Assessment Upper Extremity Assessment: Generalized weakness(grossly  at least 4/5 throughout)    Lower Extremity Assessment Lower Extremity Assessment: Generalized weakness(grossly at least 4/5 throughout)       Communication      Cognition Arousal/Alertness: Awake/alert Behavior During Therapy: WFL for tasks  assessed/performed Overall Cognitive Status: Within Functional Limits for tasks assessed                                 General Comments: mildly anxious; responds well to cuing/encouragement from therapist      General Comments      Exercises Other Exercises Other Exercises: Educated in pursed lip breathing and postural control to facilitate increased breath support; good efforts to integrate.  Will continue to reinforce throughout stay.   Assessment/Plan    PT Assessment Patient needs continued PT services  PT Problem List Decreased activity tolerance;Decreased balance;Decreased mobility;Decreased coordination;Decreased cognition;Decreased knowledge of use of DME;Decreased safety awareness;Decreased knowledge of precautions;Cardiopulmonary status limiting activity       PT Treatment Interventions DME instruction;Gait training;Stair training;Functional mobility training;Therapeutic activities;Therapeutic exercise;Balance training;Patient/family education    PT Goals (Current goals can be found in the Care Plan section)  Acute Rehab PT Goals Patient Stated Goal: to try and do what I can PT Goal Formulation: With patient Time For Goal Achievement: 01/19/18 Potential to Achieve Goals: Fair    Frequency Min 2X/week   Barriers to discharge Decreased caregiver support      Co-evaluation               AM-PAC PT "6 Clicks" Daily Activity  Outcome Measure Difficulty turning over in bed (including adjusting bedclothes, sheets and blankets)?: A Little Difficulty moving from lying on back to sitting on the side of the bed? : A Little Difficulty sitting down on and standing up from a chair with arms (e.g., wheelchair, bedside commode, etc,.)?: A Little Help needed moving to and from a bed to chair (including a wheelchair)?: A Little Help needed walking in hospital room?: A Little Help needed climbing 3-5 steps with a railing? : A Lot 6 Click Score: 17    End  of Session Equipment Utilized During Treatment: Gait belt;Oxygen Activity Tolerance: Patient tolerated treatment well Patient left: in chair;with call bell/phone within reach;with chair alarm set Nurse Communication: Mobility status PT Visit Diagnosis: Muscle weakness (generalized) (M62.81);Difficulty in walking, not elsewhere classified (R26.2)    Time: 1140-1210 PT Time Calculation (min) (ACUTE ONLY): 30 min   Charges:   PT Evaluation $PT Eval Moderate Complexity: 1 Mod PT Treatments $Therapeutic Activity: 8-22 mins   PT G Codes:        Evaluation/treatment session was lead and completed by Roxan Hockey, SPT; directly observed, guided and documented by Tera Helper, [place]. Owens Shark, PT, DPT, NCS 01/05/18, 12:07 PM [phone]
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
## 2018-01-05 NOTE — Progress Notes (Signed)
[facility] [place] NAME: Anna Mcbride    MR#:  [id number]  DATE OF BIRTH:  Aug 09, 1945  SUBJECTIVE:  CHIEF COMPLAINT:  No chief complaint on file.  Still has SOB. Family at bedside Nonproductive cough  REVIEW OF SYSTEMS:    Review of Systems  Constitutional: Positive for diaphoresis and malaise/fatigue. Negative for chills and fever.  HENT: Negative for sore throat.   Eyes: Negative for blurred vision, double vision and pain.  Respiratory: Positive for cough, shortness of breath and wheezing. Negative for hemoptysis and sputum production.   Cardiovascular: Negative for chest pain, palpitations, orthopnea and leg swelling.  Gastrointestinal: Negative for abdominal pain, constipation, diarrhea, heartburn, nausea and vomiting.  Genitourinary: Negative for dysuria and hematuria.  Musculoskeletal: Negative for back pain and joint pain.  Skin: Negative for rash.  Neurological: Negative for sensory change, speech change, focal weakness and headaches.  Endo/Heme/Allergies: Does not bruise/bleed easily.  Psychiatric/Behavioral: Negative for depression. The patient is not nervous/anxious.    DRUG ALLERGIES:   Allergies  Allergen Reactions  . Hydrocodone-Homatropine Itching  . Tussionex Pennkinetic Er [Hydrocod Polst-Cpm Polst Er] Itching  . Ciprofloxacin Rash    VITALS:  Blood pressure 117/85, pulse (!) 111, temperature 97.6 F (36.4 C), temperature source Oral, resp. rate (!) 22, height 5\' 1"  (1.549 m), weight 54 kg (119 lb), SpO2 97 %.  PHYSICAL EXAMINATION:   Physical Exam  GENERAL:  72 y.o.-year-old patient lying in the bed with resp distress EYES: Pupils equal, round, reactive to light and accommodation. No scleral icterus. Extraocular muscles intact.  HEENT: Head atraumatic, normocephalic. Oropharynx and nasopharynx clear.  NECK:  Supple, no jugular venous distention. No thyroid enlargement, no tenderness.  LUNGS: Increased Work  of breathing.  Bilateral wheezing and coarse breath sounds. CARDIOVASCULAR: S1, S2 normal. No murmurs, rubs, or gallops.  ABDOMEN: Soft, nontender, nondistended. Bowel sounds present. No organomegaly or mass.  EXTREMITIES: No cyanosis, clubbing or edema b/l.    NEUROLOGIC: Cranial nerves II through XII are intact. No focal Motor or sensory deficits b/l.   PSYCHIATRIC: The patient is alert and awake.  Anxious SKIN: No obvious rash, lesion, or ulcer.   LABORATORY PANEL:   CBC Recent Labs  Lab 01/04/18 0510  WBC 5.5  HGB 11.3*  HCT 33.5*  PLT 168   ------------------------------------------------------------------------------------------------------------------ Chemistries  Recent Labs  Lab 01/02/18 1158  01/04/18 0510  NA 139   < > 140  K 4.3   < > 3.3*  CL 104   < > 103  CO2 24   < > 28  GLUCOSE 113*   < > 140*  BUN 45*   < > 55*  CREATININE 0.96   < > 1.26*  CALCIUM 9.1   < > 8.2*  MG  --   --  2.6*  AST 47*  --   --   ALT 50  --   --   ALKPHOS 67  --   --   BILITOT 0.9  --   --    < > = values in this interval not displayed.   ------------------------------------------------------------------------------------------------------------------  Cardiac Enzymes No results for input(s): TROPONINI in the last 168 hours. ------------------------------------------------------------------------------------------------------------------  RADIOLOGY:  Dg Chest 2 View  Result Date: 01/03/2018 CLINICAL DATA:  Worsening shortness of breath EXAM: CHEST - 2 VIEW COMPARISON:  01/02/2018 FINDINGS: Left Port-A-Cath remains in place, unchanged. Right apical pleural thickening again noted, stable. Bilateral airspace opacities again noted, throughout much of the  right lung and in the left mid and upper lung. These have worsened slightly since prior study. Probable small effusions. No acute bony abnormality. IMPRESSION: Bilateral airspace opacities have worsened since prior study concerning  for worsening pneumonia. Small bilateral effusions. Electronically Signed   By: Rolm Baptise M.D.   On: 01/03/2018 15:41   ASSESSMENT AND PLAN:   * Acute on chronic hypoxic respiratory failure secondary to bilateral pneumonia over chronic pulmonary fibrosis and radiation pneumonitis Still has significant shortness of breath.   Continue Broad-spectrum IV antibiotics. Steroids and bronchodilator nebulizers. Appreciate pulmonary input  * Anxiety.  Xanax as needed.  * Non-Hodgkin's lymphoma in remission  * Intermittent right upper quadrant pain.  Ultrasound showed no gallstones or issues with gallbladder.  Discussed with Dr. Rosana Hoes of surgery who saw the patient in the past..  Pain has resolved. This is likely due to pleurisy  * Mild hematuria due to foley trauma Improving  DVT prophylaxis with Lovenox  All the records are reviewed and case discussed with Care Management/Social Worker Management plans discussed with the patient, family and they are in agreement.  CODE STATUS: FULL CODE  TOTAL TIME TAKING CARE OF THIS PATIENT: 35 minutes.   POSSIBLE D/C IN 2-3 DAYS, DEPENDING ON CLINICAL CONDITION.  Neita Carp M.D on 01/05/2018 at 11:22 AM  Between 7am to 6pm - Pager - [phone]  After 6pm go to [email or web address] - password [email or web address] [facility]  Office  [phone]  CC: Primary care physician; Baxter Hire, MD  Note: This dictation was prepared with Dragon dictation along with smaller phrase technology. Any transcriptional errors that result from this process are unintentional.

## 2018-01-05 NOTE — Consult Note (Signed)
[facility] Cardiology Consultation Note  Patient ID: Anna Mcbride, MRN: [id number], DOB/AGE: 74/13/1947 74 y.o. Admit date: 01/02/2018   Date of Consult: 01/05/2018 Primary Physician: Baxter Hire, MD Primary Cardiologist: None  Chief Complaint: No chief complaint on file.  Reason for Consult: Shortness of breath with pleural effusions concerning for congestive heart failure  HPI: 72 y.o. 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  The patient now has significant pulmonary fibrosis and multiple infections causing significant shortness of breath with and without physical activity.  The patient is never had any cardiovascular concerns but now has had chest x-rays and CT scan suggesting pleural effusions in addition to her current issues of pulmonary fibrosis.  This may be secondary to diastolic dysfunction heart failure and or cancer and or infection.  She has had a recent hospitalization with appeared to be pneumonia with slight improvements of hypoxia over the last 2 days but continues to have some difficulty with breathing.  There is been no evidence of myocardial infarction in the past her current and she does have an EKG currently showing normal sinus rhythm otherwise normal EKG.  The patient has been treated with furosemide for her pleural effusions and this is slightly improved at this time there has been CAT scan suggesting no evidence of coronary calcification but there is some minimal aortic atherosclerosis.  Currently she has not had any chest discomfort  Past Medical History:  Diagnosis Date  . Acquired hypothyroidism 07/01/2013  . Acute respiratory failure ([place]) 11/29/2014  . 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 Asthma   . Back pain with sciatica 05/07/2017  . Biliary sludge 12/19/2017  . Lymphoma, non-Hodgkin's ([place]) 06/30/2013  . Non Hodgkin's lymphoma ([place]) [id number]  .  Personal history of chemotherapy   . Recurrent upper respiratory infection (URI) 09/01/2015  . 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 NASAL SINUS SURGERY  08/07/05 09/09/05   x 2   . VIDEO BRONCHOSCOPY Bilateral 11/30/2014   Procedure: VIDEO BRONCHOSCOPY WITHOUT FLUORO;  Surgeon: Flora Lipps, MD;  Location: [facility] ORS;  Service: Cardiopulmonary;  Laterality: Bilateral;     Home Meds: Prior to Admission medications   Medication Sig Start Date End Date Taking? Authorizing Provider  ALPRAZolam (XANAX) 0.5 MG tablet TAKE 1 TABLET BY MOUTH AT BEDTIME AS NEEDED FOR SLEEP 12/10/16  Yes Flora Lipps, MD  aspirin EC 81 MG tablet Take 81 mg by mouth daily.   Yes [provider]  azithromycin (ZITHROMAX) 250 MG tablet Take 1 tablet (250 mg total) by mouth daily. Take 2 tabs at once on the first day, then once daily. 12/30/17  Yes [name], MD  budesonide (PULMICORT) 0.5 MG/2ML nebulizer solution Take 2 mLs (0.5 mg total) by nebulization 2 (two) times daily. 01/06/17  Yes Kasa, Maretta Bees, MD  citalopram (CELEXA) 20 MG tablet TAKE 1 TABLET BY MOUTH ONCE DAILY 12/16/17  Yes Cammie Sickle, MD  levothyroxine (SYNTHROID, LEVOTHROID) 25 MCG tablet TAKE 1 TABLET BY MOUTH ONCE DAILY ON AN EMPTY STOMACH. WAIT 30 MINUTES BEFORE TAKING OTHER MEDS. 12/03/17  Yes Cammie Sickle, MD  omeprazole (PRILOSEC) 20 MG capsule TAKE 1 CAPSULE BY MOUTH TWICE DAILY BEFORE MEALS 02/03/17  Yes Cammie Sickle, MD  potassium chloride SA (K-DUR,KLOR-CON) 20 MEQ tablet Take 1  tablet (20 mEq total) by mouth daily. 10/27/17  Yes Cammie Sickle, MD  predniSONE (STERAPRED UNI-PAK 21 TAB) 10 MG (21) TBPK tablet Take as directed. Patient taking differently: Take 10 mg by mouth taper from 4 doses each day to 1 dose and stop.  12/30/17  Yes [name], MD  vitamin C (ASCORBIC  ACID) 500 MG tablet Take 500 mg by mouth daily.   Yes [provider]  albuterol (PROVENTIL HFA;VENTOLIN HFA) 108 (90 BASE) MCG/ACT inhaler Inhale 1 puff into the lungs every 6 (six) hours as needed for wheezing or shortness of breath.    [provider]  albuterol (PROVENTIL) (2.5 MG/3ML) 0.083% nebulizer solution Take 3 mLs (2.5 mg total) by nebulization every 4 (four) hours as needed for wheezing or shortness of breath. 01/06/17   Flora Lipps, MD  AMBULATORY NON FORMULARY MEDICATION Medication Name: Incentive Spirometer Use 10-15 times daily 07/29/16   Flora Lipps, MD  formoterol (PERFOROMIST) 20 MCG/2ML nebulizer solution Take 2 mLs (20 mcg total) by nebulization 2 (two) times daily. Patient not taking: Reported on 01/02/2018 01/06/17   Flora Lipps, MD  ondansetron (ZOFRAN ODT) 4 MG disintegrating tablet Take 1 tablet (4 mg total) every 8 (eight) hours as needed by mouth for nausea or vomiting. Patient not taking: Reported on 01/02/2018 05/30/17   Hillary Bow, MD  Respiratory Therapy Supplies (FLUTTER) DEVI Use 10-15 times daily 07/29/16   Flora Lipps, MD    Inpatient Medications:  . aspirin EC  81 mg Oral Daily  . budesonide  0.5 mg Nebulization BID  . citalopram  20 mg Oral Daily  . enoxaparin (LOVENOX) injection  30 mg Subcutaneous Daily  . famotidine  20 mg Oral Daily  . feeding supplement (ENSURE ENLIVE)  237 mL Oral TID BM  . furosemide  20 mg Oral Daily  . ipratropium-albuterol  3 mL Nebulization Q6H  . levothyroxine  25 mcg Oral QAC breakfast  . [START ON 01/06/2018] methylPREDNISolone (SOLU-MEDROL) injection  40 mg Intravenous Daily  . multivitamin with minerals  1 tablet Oral Daily  . nystatin  5 mL Oral QID  . pantoprazole  40 mg Oral Daily  . polyethylene glycol  17 g Oral Daily  . potassium chloride SA  20 mEq Oral Daily  . vitamin C  500 mg Oral Daily   . piperacillin-tazobactam (ZOSYN)  IV 3.375 g (01/05/18 1400)    Allergies:  Allergies  Allergen  Reactions  . Hydrocodone-Homatropine Itching  . Tussionex Pennkinetic Er [Hydrocod Polst-Cpm Polst Er] Itching  . Ciprofloxacin Rash    Social History   Socioeconomic History  . Marital status: Married    Spouse name: Not on file  . Number of children: 2  . Years of education: Not on file  . Highest education level: Not on file  Occupational History  . Occupation: Retired    Comment: Tour manager  . Financial resource strain: Not on file  . Food insecurity:    Worry: Not on file    Inability: Not on file  . Transportation needs:    Medical: Not on file    Non-medical: Not on file  Tobacco Use  . Smoking status: Never Smoker  . Smokeless tobacco: Never Used  Substance and Sexual Activity  . Alcohol use: Not Currently    Frequency: Never    Comment: a couple glasses of wine a year  . Drug use: No  . Sexual activity: Not on file  Lifestyle  . Physical  activity:    Days per week: Not on file    Minutes per session: Not on file  . Stress: Not on file  Relationships  . Social connections:    Talks on phone: Not on file    Gets together: Not on file    Attends religious service: Not on file    Active member of club or organization: Not on file    Attends meetings of clubs or organizations: Not on file    Relationship status: Not on file  . Intimate partner violence:    Fear of current or ex partner: Not on file    Emotionally abused: Not on file    Physically abused: Not on file    Forced sexual activity: Not on file  Other Topics Concern  . Not on file  Social History Narrative  . Not on file     Family History  Problem Relation Age of Onset  . Asthma Son   . Congestive Heart Failure Mother   . Diabetes Brother   . Pulmonary embolism Father   . Breast cancer Neg Hx      Review of Systems Positive for redness of breath cough congestion Negative for: General:  chills, fever, night sweats or weight changes.  Cardiovascular: PND orthopnea syncope  dizziness  Dermatological skin lesions rashes Respiratory: Positive for cough congestion Urologic: Frequent urination urination at night and hematuria Abdominal: negative for nausea, vomiting, diarrhea, bright red blood per rectum, melena, or hematemesis Neurologic: negative for visual changes, and/or hearing changes  All other systems reviewed and are otherwise negative except as noted above.  Labs: No results for input(s): CKTOTAL, CKMB, TROPONINI in the last 72 hours. Lab Results  Component Value Date   WBC 5.5 01/04/2018   HGB 11.3 (L) 01/04/2018   HCT 33.5 (L) 01/04/2018   MCV 99.0 01/04/2018   PLT 168 01/04/2018    Recent Labs  Lab 01/02/18 1158  01/04/18 0510  NA 139   < > 140  K 4.3   < > 3.3*  CL 104   < > 103  CO2 24   < > 28  BUN 45*   < > 55*  CREATININE 0.96   < > 1.26*  CALCIUM 9.1   < > 8.2*  PROT 6.8  --   --   BILITOT 0.9  --   --   ALKPHOS 67  --   --   ALT 50  --   --   AST 47*  --   --   GLUCOSE 113*   < > 140*   < > = values in this interval not displayed.   No results found for: CHOL, HDL, LDLCALC, TRIG No results found for: DDIMER  Radiology/Studies:  Dg Chest 2 View  Result Date: 01/03/2018 CLINICAL DATA:  Worsening shortness of breath EXAM: CHEST - 2 VIEW COMPARISON:  01/02/2018 FINDINGS: Left Port-A-Cath remains in place, unchanged. Right apical pleural thickening again noted, stable. Bilateral airspace opacities again noted, throughout much of the right lung and in the left mid and upper lung. These have worsened slightly since prior study. Probable small effusions. No acute bony abnormality. IMPRESSION: Bilateral airspace opacities have worsened since prior study concerning for worsening pneumonia. Small bilateral effusions. Electronically Signed   By: Rolm Baptise M.D.   On: 01/03/2018 15:41   Dg Chest 2 View  Result Date: 12/17/2017 CLINICAL DATA:  Shortness of breath. History of interstitial lung disease and non-Hodgkin's lymphoma EXAM:  CHEST -  2 VIEW COMPARISON:  May 29, 2017 and Nov 26, 2016 FINDINGS: There is fibrosis in both upper lobes with scarring in the right upper lobe. There is also scarring in the right base region. There is moderate volume loss on the right. There is subtle increased opacity in the left mid lung compared to prior studies, concerning for potential early pneumonia in this area. No other new opacity evident. Heart size is normal. There is retraction in both hilar regions with distortion of pulmonary vascularity bilaterally, stable. No adenopathy is evident by radiography. Port-A-Cath tip is in the superior vena cava. No pneumothorax. No blastic or lytic bone lesions. There is an old healed fracture of the sternum. IMPRESSION: Subtle increased opacity in the left mid lung which may represent early pneumonia. There is increased opacity in this area compared to prior studies. Elsewhere there is extensive scarring, more severe on the right than on the left, which is stable. Moderate volume loss on the left. Stable cardiac silhouette. No adenopathy is appreciable on this study. Electronically Signed   By: Lowella Grip III M.D.   On: 12/17/2017 08:33   Ct Angio Chest Pe W Or Wo Contrast  Result Date: 12/18/2017 CLINICAL DATA:  Lymphoma.  Worsening breathing. EXAM: CT ANGIOGRAPHY CHEST WITH CONTRAST TECHNIQUE: Multidetector CT imaging of the chest was performed using the standard protocol during bolus administration of intravenous contrast. Multiplanar CT image reconstructions and MIPs were obtained to evaluate the vascular anatomy. CONTRAST:  72mL ISOVUE-370 IOPAMIDOL (ISOVUE-370) INJECTION 76% COMPARISON:  06/18/2016 FINDINGS: Cardiovascular: Preferential opacification of the thoracic aorta. No evidence of thoracic aortic aneurysm or dissection. Normal heart size. No pericardial effusion. No pericardial effusion. Mediastinum/Nodes: Small scattered mediastinal nodes. Stable left subclavian Port-A-Cath device.  Lungs/Pleura: Chronic pleural and parenchymal changes at the right apex are stable. The previously visualized right pneumothorax has resolved. Patchy ground-glass opacities throughout the left lung have markedly improved. Bronchiectasis in the lingula is present and not significantly changed. No new dominant pulmonary parenchymal mass. Upper Abdomen: Central right lobe liver cyst is stable. Musculoskeletal: No vertebral compression deformity. There is a healing fracture of the sternum with deformity. Review of the MIP images confirms the above findings. IMPRESSION: No evidence of acute pulmonary thromboembolism. Narrowing of the left subclavian and innominate arteries as described. Stable pleural and parenchymal changes at the right apex related to the patient's known lymphoma. Improved ground-glass opacities throughout the left lung is supporting inflammatory etiology. Aortic Atherosclerosis (ICD10-I70.0). Electronically Signed   By: Marybelle Killings M.D.   On: 12/18/2017 14:08   Ct Abdomen Pelvis W Contrast  Result Date: 12/19/2017 CLINICAL DATA:  Nausea and vomiting. EXAM: CT ABDOMEN AND PELVIS WITH CONTRAST TECHNIQUE: Multidetector CT imaging of the abdomen and pelvis was performed using the standard protocol following bolus administration of intravenous contrast. CONTRAST:  72mL ISOVUE-300 IOPAMIDOL (ISOVUE-300) INJECTION 61% COMPARISON:  February 18, 2017 FINDINGS: Lower chest: No acute abnormality. Hepatobiliary: Periportal edema seen in the liver. A hepatic cyst is identified. High attenuation material seen in the gallbladder. The portal vein is patent. Pancreas: Unremarkable. No pancreatic ductal dilatation or surrounding inflammatory changes. Spleen: Normal in size without focal abnormality. Adrenals/Urinary Tract: Adrenal glands are normal. Parapelvic cysts on the left. The kidneys are otherwise unremarkable. Contrast in the bladder is likely due to the CT of the chest from yesterday. No ureteral stones or  obstruction identified. Stomach/Bowel: The stomach and small bowel are normal. Colonic diverticulosis is seen without diverticulitis. The cecum lies deep within the pelvis.  The appendix is normal. Fecal loading identified. Vascular/Lymphatic: Atherosclerotic changes are seen in the nonaneurysmal aorta. No adenopathy. Reproductive: Uterus and bilateral adnexa are unremarkable. Other: No abdominal wall hernia or abnormality. No abdominopelvic ascites. Musculoskeletal: No acute or significant osseous findings. IMPRESSION: 1. No acute abnormalities identified. 2. Periportal edema in the liver is nonspecific. 3. High attenuation the gallbladder could represent sludge or stones. No wall thickening. 4. Colonic diverticulosis without diverticulitis. 5. Atherosclerotic changes in the nonaneurysmal aorta. Electronically Signed   By: Dorise Bullion III M.D   On: 12/19/2017 21:27   Dg Chest Port 1 View  Result Date: 01/02/2018 CLINICAL DATA:  Shortness of breath.  Non-Hodgkin's lymphoma. EXAM: PORTABLE CHEST 1 VIEW COMPARISON:  Dec 19, 2017 FINDINGS: Increased infiltrate throughout the left mid and upper lung. Increased infiltrate diffusely throughout the right lung. Increased pleural thickening or fluid at the right apex. The cardiomediastinal silhouette is stable. A Port-A-Cath is in stable position. IMPRESSION: 1. New infiltrates throughout the upper half of the left lung and diffusely throughout the right lung. New thickening towards the right apex could represent pleural thickening versus pleural fluid. Electronically Signed   By: Dorise Bullion III M.D   On: 01/02/2018 18:41   Dg Chest Portable 1 View  Result Date: 12/19/2017 CLINICAL DATA:  Chest pain EXAM: PORTABLE CHEST 1 VIEW COMPARISON:  12/16/17 FINDINGS: Cardiac shadow is stable. Left chest wall port is noted. Chronic changes in the apices are again seen. No focal infiltrate or sizable effusion is noted. The previously seen changes on the left are not well  appreciated on this exam. No acute bony abnormality is noted. IMPRESSION: No acute abnormality seen. Chronic changes in the apices are again seen. Electronically Signed   By: Inez Catalina M.D.   On: 12/19/2017 18:58   Mm 3d Screen Breast Bilateral  Result Date: 12/09/2017 CLINICAL DATA:  Screening. EXAM: DIGITAL SCREENING BILATERAL MAMMOGRAM WITH TOMO AND CAD COMPARISON:  Previous exam(s). ACR Breast Density Category c: The breast tissue is heterogeneously dense, which may obscure small masses. FINDINGS: There are no findings suspicious for malignancy. Images were processed with CAD. IMPRESSION: No mammographic evidence of malignancy. A result letter of this screening mammogram will be mailed directly to the patient. RECOMMENDATION: Screening mammogram in one year. (Code:[id number]-B-01Y) BI-RADS CATEGORY  1: Negative. Electronically Signed   By: Fidela Salisbury M.D.   On: 12/09/2017 14:37   US Abdomen Limited Ruq  Result Date: 01/02/2018 CLINICAL DATA:  Right upper quadrant pain EXAM: ULTRASOUND ABDOMEN LIMITED RIGHT UPPER QUADRANT COMPARISON:  CT 12/19/2017 FINDINGS: Gallbladder: No gallstones or wall thickening visualized. No sonographic Murphy sign noted by sonographer. 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 Portal vein is patent on color Doppler imaging with normal direction of blood flow towards the liver. Right pleural effusion. IMPRESSION: Small hyperechoic foci within the right hepatic lobe most compatible with small hemangiomas. Right pleural effusion. Electronically Signed   By: Rolm Baptise M.D.   On: 01/02/2018 15:17    EKG: Normal sinus rhythm  Weights: Filed Weights   01/02/18 1111 01/03/18 1643 01/05/18 0627  Weight: 116 lb 10 oz (52.9 kg) 119 lb 7.8 oz (54.2 kg) 119 lb (54 kg)     Physical Exam: Blood pressure 124/70, pulse (!) 112, temperature 97.7 F (36.5  C), temperature source Oral, resp. rate (!) 24, height 5\' 1"  (1.549 m), weight 119  lb (54 kg), SpO2 97 %. Body mass index is 22.48 kg/m. General: Well developed, well nourished, in no acute distress. Head eyes ears nose throat: Normocephalic, atraumatic, sclera non-icteric, no xanthomas, nares are without discharge. No apparent thyromegaly and/or mass  Lungs: Normal respiratory effort.  Diffuse wheezes, some basilar rales, diffuse rhonchi.  Heart: RRR with normal S1 S2. no murmur gallop, no rub, PMI is normal size and placement, carotid upstroke normal without bruit, jugular venous pressure is normal Abdomen: Soft, non-tender, non-distended with normoactive bowel sounds. No hepatomegaly. No rebound/guarding. No obvious abdominal masses. Abdominal aorta is normal size without bruit Extremities: Trace edema. no cyanosis, no clubbing, no ulcers  Peripheral : 2+ bilateral upper extremity pulses, 2+ bilateral femoral pulses, 2+ bilateral dorsal pedal pulse Neuro: Alert and oriented. No facial asymmetry. No focal deficit. Moves all extremities spontaneously. Musculoskeletal: Normal muscle tone without kyphosis Psych:  Responds to questions appropriately with a normal affect.    Assessment: 72 year old female with chemotherapy radiation and bone marrow transplant for non-Hodgkin's lymphoma with severe pulmonary fibrosis hypoxia and recent pneumonia as well as pleural effusions of unknown etiology concerning for possible diastolic dysfunction heart failure requiring further medication management and treatment options  Plan: 1.  Continue furosemide with gentle diuresis for pleural effusions and possible diastolic dysfunction heart failure 2.  Continue antibiotics for supportive care for pneumonia as well as oxygenation and pulmonary toilet 3.  Echocardiogram for LV systolic dysfunction valvular heart disease contributing to above 4.  Further consideration of medication management including beta-blocker  for cardiomyopathy if necessary  Signed, Corey Skains M.D. [facility] 01/05/2018, 5:04 PM

## 2018-01-05 NOTE — NC FL2 (Signed)
[place] LEVEL OF CARE SCREENING TOOL     IDENTIFICATION  Patient Name: Anna Mcbride Birthdate: [birth year] Sex: female Admission Date (Current Location): 01/02/2018  [facility] and [place] Number:  Engineering geologist and Address:  [facility], [address] [place], [place] [address]      Provider Number:    Attending Physician Name and Address:  Hillary Bow, MD  Relative Name and Phone Number:       Current Level of Care: Hospital Recommended Level of Care: [place] Prior Approval Number:    Date Approved/Denied:   PASRR Number:    Discharge Plan: SNF    Current Diagnoses: Patient Active Problem List   Diagnosis Date Noted  . Epigastric abdominal pain   . History of esophagitis 12/29/2017  . GERD (gastroesophageal reflux disease) 12/19/2017  . Biliary sludge 12/19/2017  . CKD (chronic kidney disease), stage III ([place]) 12/19/2017  . Chronic respiratory failure with hypoxia, on home O2 therapy ([place]) 12/16/2017  . Back pain with sciatica 05/07/2017  . Sepsis ([place]) 09/12/2016  . Pneumothorax   . Diffuse large B-cell lymphoma of lymph nodes of neck ([place]) 02/21/2016  . Recurrent upper respiratory infection (URI) 09/01/2015  . Acute respiratory failure with hypoxia ([place])   . 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 SOB (shortness of breath)   . Pneumonia   . Acute respiratory failure ([place]) 11/29/2014  . History of digestive disease 11/29/2014  . HLD (hyperlipidemia) 11/29/2014  . Adult hypothyroidism 11/29/2014  . Lymphoma ([place]) 11/29/2014  . RAD (reactive airway disease) 11/29/2014  . Stenosis of subclavian artery ([place]) 11/29/2014  . Pain in shoulder 11/01/2014  . Fracture of humerus, proximal 11/01/2014  . Febrile neutropenia ([place]) 11/08/2013  . Decreased potassium in the blood 09/02/2013  . Acquired hypothyroidism 07/01/2013  . Lymphoma, non-Hodgkin's ([place]) 06/30/2013  . Cervical lymphadenopathy  04/20/2013  . History of lymphoma 04/20/2013  . Dyspnea 01/29/2011  . Cough 01/29/2011    Orientation RESPIRATION BLADDER Height & Weight     Self, Time, Situation, Place  O2(3 liters) Continent Weight: 119 lb (54 kg) Height:  5\' 1"  (154.9 cm)  BEHAVIORAL SYMPTOMS/MOOD NEUROLOGICAL BOWEL NUTRITION STATUS  (None) (None) Continent Diet(Regular)  AMBULATORY STATUS COMMUNICATION OF NEEDS Skin   Extensive Assist Verbally Normal                       Personal Care Assistance Level of Assistance  Bathing, Feeding, Dressing Bathing Assistance: Limited assistance Feeding assistance: Independent Dressing Assistance: Limited assistance     Functional Limitations Info  Sight, Hearing, Speech Sight Info: Adequate Hearing Info: Adequate Speech Info: Adequate    SPECIAL CARE FACTORS FREQUENCY  OT (By licensed OT), PT (By licensed PT)                    Contractures Contractures Info: Not present    Additional Factors Info  Code Status, Allergies Code Status Info: Full Code  Allergies Info: Tussionex Pennkinetic Er Hydrocod Polst-cpm Polst Er, Hydrocodone, Ciprofloaxin           Current Medications (01/05/2018):  This is the current hospital active medication list Current Facility-Administered Medications  Medication Dose Route Frequency Provider Last Rate Last Dose  . acetaminophen (TYLENOL) tablet 650 mg  650 mg Oral Q6H PRN Sudini, Alveta Heimlich, MD      . albuterol (PROVENTIL) (2.5 MG/3ML) 0.083% nebulizer solution 2.5 mg  2.5 mg Nebulization Q4H PRN  Tukov-Yual, Magdalene S, NP      . ALPRAZolam Duanne Moron) tablet 0.5 mg  0.5 mg Oral Q6H PRN Tukov-Yual, Magdalene S, NP   0.5 mg at 01/05/18 0158  . aspirin EC tablet 81 mg  81 mg Oral Daily Tukov-Yual, Magdalene S, NP   81 mg at 01/05/18 1000  . budesonide (PULMICORT) nebulizer solution 0.5 mg  0.5 mg Nebulization BID Tukov-Yual, Magdalene S, NP   0.5 mg at 01/05/18 1113  . citalopram (CELEXA) tablet 20 mg  20 mg Oral Daily  Tukov-Yual, Magdalene S, NP   20 mg at 01/05/18 1000  . enoxaparin (LOVENOX) injection 30 mg  30 mg Subcutaneous Daily Tukov-Yual, Magdalene S, NP   30 mg at 01/05/18 1002  . famotidine (PEPCID) tablet 20 mg  20 mg Oral Daily Tukov-Yual, Magdalene S, NP   20 mg at 01/05/18 1000  . feeding supplement (ENSURE ENLIVE) (ENSURE ENLIVE) liquid 237 mL  237 mL Oral TID BM Tukov-Yual, Magdalene S, NP   237 mL at 01/05/18 1002  . furosemide (LASIX) tablet 20 mg  20 mg Oral Daily Tukov-Yual, Magdalene S, NP   20 mg at 01/05/18 1000  . ipratropium-albuterol (DUONEB) 0.5-2.5 (3) MG/3ML nebulizer solution 3 mL  3 mL Nebulization Q6H Tukov-Yual, Magdalene S, NP   3 mL at 01/05/18 1530  . levothyroxine (SYNTHROID, LEVOTHROID) tablet 25 mcg  25 mcg Oral QAC breakfast Tukov-Yual, Magdalene S, NP   25 mcg at 01/05/18 1001  . [START ON 01/06/2018] methylPREDNISolone sodium succinate (SOLU-MEDROL) 40 mg/mL injection 40 mg  40 mg Intravenous Daily [name], MD      . multivitamin with minerals tablet 1 tablet  1 tablet Oral Daily Tukov-Yual, Magdalene S, NP   1 tablet at 01/05/18 1000  . nystatin (MYCOSTATIN) 100000 UNIT/ML suspension 500,000 Units  5 mL Oral QID Tukov-Yual, Magdalene S, NP   500,000 Units at 01/05/18 1003  . ondansetron (ZOFRAN) injection 4 mg  4 mg Intravenous Q6H PRN Tukov-Yual, Magdalene S, NP   4 mg at 01/05/18 0832  . ondansetron (ZOFRAN) tablet 4 mg  4 mg Oral Q8H PRN Tukov-Yual, Magdalene S, NP   4 mg at 01/03/18 0701  . pantoprazole (PROTONIX) EC tablet 40 mg  40 mg Oral Daily Tukov-Yual, Magdalene S, NP   40 mg at 01/05/18 1000  . piperacillin-tazobactam (ZOSYN) IVPB 3.375 g  3.375 g Intravenous Q8H Tukov-Yual, Magdalene S, NP   Stopped at 01/05/18 1003  . polyethylene glycol (MIRALAX / GLYCOLAX) packet 17 g  17 g Oral Daily Hillary Bow, MD   17 g at 01/05/18 1000  . potassium chloride SA (K-DUR,KLOR-CON) CR tablet 20 mEq  20 mEq Oral Daily Tukov-Yual, Magdalene S, NP   20 mEq at  01/05/18 1000  . traMADol (ULTRAM) tablet 50 mg  50 mg Oral Q6H PRN Tukov-Yual, Magdalene S, NP      . vitamin C (ASCORBIC ACID) tablet 500 mg  500 mg Oral Daily Tukov-Yual, Magdalene S, NP   500 mg at 01/04/18 0920   Facility-Administered Medications Ordered in Other Encounters  Medication Dose Route Frequency Provider Last Rate Last Dose  . heparin lock flush 100 unit/mL  500 Units Intravenous Once Charlaine Dalton R, MD      . sodium chloride flush (NS) 0.9 % injection 10 mL  10 mL Intravenous Once Cammie Sickle, MD         Discharge Medications: Please see discharge summary for a list of discharge medications.  Relevant Imaging  Results:  Relevant Lab Results:   Additional Information SSN [id number]  Annamaria Boots, [place]

## 2018-01-05 NOTE — Progress Notes (Signed)
This RT attempted to administer breathing treatments, pt stated she is nauseous and requested that I come back later

## 2018-01-05 NOTE — Plan of Care (Signed)
  Problem: Education: Goal: Knowledge of General Education information will improve Outcome: Progressing   Problem: Health Behavior/Discharge Planning: Goal: Ability to manage health-related needs will improve Outcome: Progressing   Problem: Clinical Measurements: Goal: Ability to maintain clinical measurements within normal limits will improve Outcome: Progressing Goal: Will remain free from infection Outcome: Progressing Goal: Diagnostic test results will improve Outcome: Progressing Goal: Cardiovascular complication will be avoided Outcome: Progressing   Problem: Activity: Goal: Risk for activity intolerance will decrease Outcome: Progressing   Problem: Nutrition: Goal: Adequate nutrition will be maintained Outcome: Progressing   Problem: Coping: Goal: Level of anxiety will decrease Outcome: Progressing   Problem: Elimination: Goal: Will not experience complications related to bowel motility Outcome: Progressing Goal: Will not experience complications related to urinary retention Outcome: Progressing   Problem: Pain Managment: Goal: General experience of comfort will improve Outcome: Progressing   Problem: Safety: Goal: Ability to remain free from injury will improve Outcome: Progressing   Problem: Skin Integrity: Goal: Risk for impaired skin integrity will decrease Outcome: Progressing   

## 2018-01-05 NOTE — Care Management Note (Signed)
Case Management Note  Patient Details  Name: Anna Mcbride MRN: [id number] Date of Birth: 03-13-1946  Subjective/Objective:  Admitted to [facility] with the diagnosis of acute respiratory failure. Lives with husband, Anna Mcbride, [phone]). Last seen Dr. Edwina Barth 01/02/18. Prescriptions are filled at [facility]. Home Health per [facility] x 2 in the past. No skilled facility. Home oxygen per [facility]. Uses 2 liters per nasal cannula. Cane and raised toilet seat in the home,  Takes care of all basic activities of daily living herself. Good family support. Fair appetite.                  Action/Plan: Will continue to follow for discharge planning.   Expected Discharge Date:  01/03/18               Expected Discharge Plan:     In-House Referral:     Discharge planning Services     Post Acute Care Choice:    Choice offered to:     DME Arranged:    DME Agency:     HH Arranged:    HH Agency:     Status of Service:     If discussed at [employer], dates discussed:    Additional Comments:  Shelbie Ammons, RN MSN CCM Care Management [phone] 01/05/2018, 9:55 AM
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
## 2018-01-05 NOTE — Progress Notes (Signed)
Pharmacy Antibiotic Note  Anna Mcbride is a 72 y.o. female admitted on 01/02/2018 with pneumonia.  Pharmacy has been consulted for vancomycin and Zosyn dosing. She has a known history of chronic hypoxic respiratory failure living on 2 L of oxygen, non-Hodgkin's lymphoma with recurrence seeing Dr. Rogue Bussing as an outpatient, on chemotherapy, seeing Dr. Ashby Dawes and Dr. Mortimer Fries for history of chronic pulmonary infiltrates from the previous radiation treatment was seen by primary care physician and patient was placed on Z-Pak and prednisone tapering dose with no clinical improvement.  Patient was desaturating on today's primary care physician follow-up with increased shortness of breath and patient is sent over to the hospital as a direct admit. She was originally thought not to have PNA but CXR revealed pulmonary infiltrates.   Plan: Zosyn 3.375g EI q8h    Height: 5\' 1"  (154.9 cm) Weight: 119 lb (54 kg) IBW/kg (Calculated) : 47.8  Temp (24hrs), Avg:97.9 F (36.6 C), Min:97.6 F (36.4 C), Max:98.1 F (36.7 C)  Recent Labs  Lab 01/02/18 1158 01/03/18 0806 01/04/18 0510  WBC 11.1*  --  5.5  CREATININE 0.96 1.41* 1.26*    Estimated Creatinine Clearance: 29.6 mL/min (A) (by C-G formula based on SCr of 1.26 mg/dL (H)).    Allergies  Allergen Reactions  . Hydrocodone-Homatropine Itching  . Tussionex Pennkinetic Er [Hydrocod Polst-Cpm Polst Er] Itching  . Ciprofloxacin Rash    Antimicrobials this admission: Azithromycin 6/14 Vancomycin 6/14>>6/17 Zosyn 6/14 >>   Microbiology results: 6/14 Sputum: sent 6/15 MRSA PCR: neg  Thank you for allowing pharmacy to be a part of this patient's care.  Rocky Morel, PharmD 01/05/2018 1:18 PM

## 2018-01-05 NOTE — Evaluation (Signed)
Occupational Therapy Evaluation Patient Details Name: Anna Mcbride MRN: [id number] DOB: [birth year] Today's Date: 01/05/2018    History of Present Illness 72yo female pt presented to ER secondary to worsening SOB, desaturation; admitted for management of bilat PNA, chronic pulmonary fibrosis, radiation pneumonitis and symptomatic cholelithiasis (pending surgery consult).   Clinical Impression   Pt seen for OT evaluation this date. Pt was independent in all ADLs and mobility, living in a 1 story home with her spouse and small dog. Pt on 3 liters of O2 at home, recently increased from 2L within past few weeks. Pt has strong support system from family and friends/church members. She stays active with church, book club, plays cards monthly, and socializes with friends often. Pt reports becoming easily fatigued or out of breath with minimize exertion, requiring increasing assist for IADL, such as cooking and cleaning. Pt states that she could get back to doing some cooking. Pt currently requires mod assist for LB ADL and min assist with RW for mobility due to poor activity tolerance and decreased strength. Pt/family educated in energy conservation conservation strategies including pursed lip breathing, activity pacing, home/routines modifications, work simplification, AE/DME, prioritizing of meaningful occupations, and falls prevention. Handout provided. Pt verbalized understanding but would benefit from additional skilled OT services to maximize recall and carryover of learned techniques and facilitate implementation of learned techniques into daily routines. Upon discharge, recommend STR to maximize pt's eventual return home.      Follow Up Recommendations  SNF    Equipment Recommendations  3 in 1 bedside commode;Other (comment)(HH shower head, reacher, sock aide)    Recommendations for Other Services       Precautions / Restrictions Precautions Precautions: Other (comment) Precaution Comments:  monitor HR and O2 sats Restrictions Weight Bearing Restrictions: No      Mobility Bed Mobility     General bed mobility comments: deferred, up in recliner  Transfers     General transfer comment: deferred, pt fatigued, has family in the room and had been eating lunch. Willing to try next session.    Balance Overall balance assessment: Needs assistance Sitting-balance support: No upper extremity supported;Feet supported Sitting balance-Leahy Scale: Good                               ADL either performed or assessed with clinical judgement   ADL Overall ADL's : Needs assistance/impaired Eating/Feeding: Sitting;Modified independent Eating/Feeding Details (indicate cue type and reason): Additional time to eat, has self limited meats and things that require additional time/effort to chew, was sipping soup through a straw for lunch meal and states this worked well for her. Grooming: Sitting;Minimal assistance   Upper Body Bathing: Sitting;Minimal assistance;Moderate assistance   Lower Body Bathing: Sitting/lateral leans;Moderate assistance   Upper Body Dressing : Sitting;Minimal assistance   Lower Body Dressing: Sitting/lateral leans;Moderate assistance   Toilet Transfer: Minimal assistance;RW;BSC;Ambulation           Functional mobility during ADLs: Minimal assistance;Rolling walker General ADL Comments: fatigues very quickly, pt/family educated in AE for LB dressing and bathing as well as sitting for ADL tasks to maximize energy conservation and minimize SOB/over exertion     Vision Baseline Vision/History: Wears glasses Wears Glasses: At all times Patient Visual Report: No change from baseline       Perception     Praxis      Pertinent Vitals/Pain Pain Assessment: No/denies pain     Hand Dominance Right  Extremity/Trunk Assessment Upper Extremity Assessment Upper Extremity Assessment: Generalized weakness(grossly 4/5 bilaterally)   Lower  Extremity Assessment Lower Extremity Assessment: Generalized weakness(grossly 4/5 bilaterally)       Communication Communication Communication: No difficulties   Cognition Arousal/Alertness: Awake/alert Behavior During Therapy: WFL for tasks assessed/performed Overall Cognitive Status: Within Functional Limits for tasks assessed                                 General Comments: mildly anxious; responds well to cuing/encouragement from therapist   General Comments       Exercises Other Exercises Other Exercises: Review of education in pursed lip breathing and postural control to facilitate increased breath support provided to maximize recall and carryover. Instructed pt to take brief rest breaks while eating/drinking and when conversing with others to minimize SOB Other Exercises: Pt/family educated in energy conservation strategies to support independence and safety while minimizing falls and risk of over exertion as well as minimize caregiver burden. Handout provided. Pt will benefit from additional training/education to support recall and carryover into her daily routines.   Shoulder Instructions      Home Living Family/patient expects to be discharged to:: Private residence Living Arrangements: Spouse/significant other Available Help at Discharge: Family;Available 24 hours/day Type of Home: House Home Access: Stairs to enter [employer] of Steps: 2 Entrance Stairs-Rails: None Home Layout: One level     Bathroom Shower/Tub: Occupational psychologist: Handicapped height(one of each)     Home Equipment: Cane - single point;Shower seat - built in;Grab bars - tub/shower   Additional Comments: Does have single step to access living and dining room of home environment; does not typically utilize (able to access kitchen, sunroom, bed and bathroom on primary level)      Prior Functioning/Environment Level of Independence: Independent         Comments: Indep with ADLs, household and limited community distances; home O2 at 3L (recently increased from 2L); does endorse at least 1-2 falls within previous six months. Very active with church, book club, friends, etc.        OT Problem List: Decreased strength;Decreased knowledge of use of DME or AE;Decreased activity tolerance;Cardiopulmonary status limiting activity      OT Treatment/Interventions: Self-care/ADL training;Therapeutic exercise;Therapeutic activities;Energy conservation;DME and/or AE instruction;Patient/family education    OT Goals(Current goals can be found in the care plan section) Acute Rehab OT Goals Patient Stated Goal: get better and be able to go home OT Goal Formulation: With patient Time For Goal Achievement: 01/19/18 Potential to Achieve Goals: Good ADL Goals Pt Will Perform Lower Body Dressing: with set-up;sit to/from stand;with min guard assist;with adaptive equipment(using learned adaptive strategies, O2 sats >95% on 3L O2) Pt Will Transfer to Toilet: with min guard assist;ambulating;regular height toilet(LRAD for amb, BSC over toilet for UE's, O2 sats >95% on 3L) Additional ADL Goal #1: Pt/spouse will verbalize plan to implement at least 1 learned ECS to maximize safety and independence in the home.  OT Frequency: Min 2X/week   Barriers to D/C:            Co-evaluation              AM-PAC PT "6 Clicks" Daily Activity     Outcome Measure Help from another person eating meals?: None Help from another person taking care of personal grooming?: A Little Help from another person toileting, which includes using toliet, bedpan, or urinal?: A  Little Help from another person bathing (including washing, rinsing, drying)?: A Lot Help from another person to put on and taking off regular upper body clothing?: A Little Help from another person to put on and taking off regular lower body clothing?: A Lot 6 Click Score: 17   End of Session Equipment  Utilized During Treatment: Oxygen  Activity Tolerance: Patient tolerated treatment well Patient left: in chair;with call bell/phone within reach;with chair alarm set;with family/visitor present  OT Visit Diagnosis: Other abnormalities of gait and mobility (R26.89);Muscle weakness (generalized) (M62.81);History of falling (Z91.81)                Time: 1403-1430 OT Time Calculation (min): 27 min Charges:  OT General Charges $OT Visit: 1 Visit OT Evaluation $OT Eval Low Complexity: 1 Low OT Treatments $Self Care/Home Management : 8-22 mins  Jeni Salles, MPH, MS, OTR/L ascom [phone] 01/05/18, 2:52 PM

## 2018-01-05 NOTE — Progress Notes (Signed)
Upon transferring pt from ICU bed to floor bed, small red clots noted to pt's urine output in foley.  Caren Griffins, RN receiving pt on 1C notified of this.  Verbalized understanding.  Pt not having any pain at this time.

## 2018-01-05 NOTE — Care Management Important Message (Signed)
Important Message  Patient Details  Name: Anna Mcbride MRN: [id number] Date of Birth: 09-13-1945   Medicare Important Message Given:  Yes    Juliann Pulse A Luisana Lutzke 01/05/2018, 11:12 AM

## 2018-01-06 DIAGNOSIS — R52 Pain, unspecified: Secondary | ICD-10-CM

## 2018-01-06 DIAGNOSIS — Z7189 Other specified counseling: Secondary | ICD-10-CM

## 2018-01-06 DIAGNOSIS — Z515 Encounter for palliative care: Secondary | ICD-10-CM

## 2018-01-06 DIAGNOSIS — F4322 Adjustment disorder with anxiety: Secondary | ICD-10-CM

## 2018-01-06 LAB — BASIC METABOLIC PANEL
Anion gap: 8 (ref 5–15)
BUN: 52 mg/dL — AB (ref 6–20)
CALCIUM: 8.6 mg/dL — AB (ref 8.9–10.3)
CO2: 32 mmol/L (ref 22–32)
CREATININE: 1.24 mg/dL — AB (ref 0.44–1.00)
Chloride: 101 mmol/L (ref 101–111)
GFR calc non Af Amer: 42 mL/min — ABNORMAL LOW (ref >60)
GFR, EST AFRICAN AMERICAN: 48 mL/min — AB (ref >60)
Glucose, Bld: 89 mg/dL (ref 65–99)
Potassium: 3.4 mmol/L — ABNORMAL LOW (ref 3.5–5.1)
SODIUM: 141 mmol/L (ref 135–145)

## 2018-01-06 LAB — MAGNESIUM: MAGNESIUM: 2.3 mg/dL (ref 1.7–2.4)

## 2018-01-06 LAB — ECHOCARDIOGRAM COMPLETE
Height: 61 in
WEIGHTICAEL: 1911.83 [oz_av]

## 2018-01-06 MED ORDER — CITALOPRAM HYDROBROMIDE 20 MG PO TABS
30.0000 mg | ORAL_TABLET | Freq: Every day | ORAL | Status: DC
  Administered 2018-01-07 – 2018-01-08 (×2): 30 mg via ORAL
  Filled 2018-01-06 (×2): qty 2

## 2018-01-06 MED ORDER — ALPRAZOLAM 0.5 MG PO TABS
0.5000 mg | ORAL_TABLET | Freq: Every day | ORAL | Status: DC
  Administered 2018-01-06 – 2018-01-07 (×2): 0.5 mg via ORAL
  Filled 2018-01-06 (×2): qty 1

## 2018-01-06 MED ORDER — ALPRAZOLAM 0.25 MG PO TABS
0.2500 mg | ORAL_TABLET | Freq: Four times a day (QID) | ORAL | Status: DC | PRN
  Administered 2018-01-07 – 2018-01-08 (×2): 0.25 mg via ORAL
  Filled 2018-01-06 (×2): qty 1
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
## 2018-01-06 NOTE — Consult Note (Signed)
Consultation Note Date: 01/06/2018   Patient Name: Anna Mcbride  DOB: 02-Aug-1945  MRN: [id number]  Age / Sex: 72 y.o., female  PCP: Anna Hire, MD Referring Physician: Hillary Bow, MD  Reason for Consultation: Establishing goals of care  HPI/Patient Profile: 72 y.o. female admitted on 01/02/2018 with increased shortness of breath. She has a past medical history significant for chronic hypoxia, respiratory failure (2L home oxygen), Non-Hodgkins lymphoma s/p bone marrow transplant, chemotherapy, radiation therapy, anemia, and stenosis of subclavian artery. She is followed by Dr. Rogue Mcbride in outpatient setting, she also sees Dr. Ashby Mcbride and Dr. Mortimer Mcbride for history of chronic pulmonary infiltrates r/t previous radiation treatments. Prior to admission she was seen by her PCP and placed on Z-Pak and prednisone tapering dose with no clinical improvement.  Patient was seen in the office by PCP the day of admission and was found desaturating with increased shortness of breath. On assessment by admitting provider she was very anxious.  It was patient's 50th wedding anniversary and they had planned a big  party for the following evening. Patient desperately wanted to get better by tomorrow evening to attend the function.  Husband was at bedside.  Patient denied any chest pain, she was on 3 L of oxygen via nasal cannula. CTA chest performed on 12/18/2017 revealed no pulmonary embolism but narrowing of the left subclavian and innominate arteries and stable pleural and parenchymal changes related to the lymphoma and improved groundglass opacities of inflammatory origin. Palliative Medicine team consulted for goals of care discussion.   Clinical Assessment and Goals of Care: I have reviewed medical records including lab results, imaging, Epic notes, and MAR, received report from the bedside RN, and assessed the patient. I  then met at the bedside with the patient and her husband Anna Mcbride to discuss diagnosis prognosis, Anna Mcbride, EOL wishes, disposition and options. Patient is alert and oriented, somewhat short of breath. She was able to engage in goals of care discussion with her husband and I.   I introduced Palliative Medicine as specialized medical care for people living with serious illness. It focuses on providing relief from the symptoms and stress of a serious illness. The goal is to improve quality of life for both the patient and the family.  We discussed a brief life review of the patient. 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 She has a son who lives in [place] and her daughter who lives in [place]. She has 4 grandchildren. She is a woman of [place]. She reports she loves her family and her church family dearly. She also has an [place]sk[place] dog who she loves. They are also members of the [employer].   As far as functional and nutritional status Anna Mcbride reports noticing a decline in her health over the past 2-3 years. She reports her breathing has become more difficult over the past months to were it exhaust her to perform any ADLs or task for long periods of time. She is on  home oxygen 2L which has recently been increased to 3L. She was ambulatory prior to admission. Reports and increase in her anxiety, which she feels is mostly related to the unknown and the fact that at times she feels as though she can't catch her breath. Her husband is her main caregiver in the home. She also notices a decrease in her appetite.   We discussed her current illness and what it means in the larger context of her on-going co-morbidities.  Natural disease trajectory and expectations at EOL were discussed.  I attempted to elicit values and goals of care important to the patient.    The difference between aggressive medical intervention and comfort care was considered in light of the patient's  goals of care. Patient and husband both states they would like to continue to treat the treatable, despite being told that her lung conditions would not show much more improvement. They verbalize they have a strong faith and still have hope of some improvement although it may not be 100%. We discussed her goals and she is hopeful to return home and regain some strength to care for herself. She states "I am not looking for a miracle, just increase in my quality". We discussed what quality of life looks like for her and she reported being with her friends and family. Being able to attend church, and sit on the porch with her dog.   Advanced directives, concepts specific to code status, artifical feeding and hydration, and rehospitalization were considered and discussed. Both patient and her husband become tearful during conversation and stated at this point they wanted her to remain a full code and perform all aggressive measures. Her husband states if she had to be placed on life-support we are ok with that for a timed-trial knowing he would possibly be faced with a decision of taking her off the ventilator. Anna Mcbride verbalized these are her wishes to and feels she has to take a chance.   Hospice and Palliative Care services outpatient were explained and offered. Patient and husband verbalized that they would like to have outpatient palliative services once she is discharged for the extra support and awareness when the time comes they could work with their outpatient team to transition to hospice.   Questions and concerns were addressed.  The family was encouraged to call with questions or concerns.  PMT will continue to support holistically.  Primary Decision Maker: Patient is alert and oriented and able to make medical decisions HCPOA-Anna Mcbride (husband)     [place] patient/husband's request. They verbalized their awareness of her current respiratory state and  function, however, they would still like to continue with aggressive measures even if it requires CPR/intubation. She verbalized she is ok with being on life-support for a limited amount of time and husband would be prepared to make decisions regarding removal of ventilation if necessary.   Continue to treat the treatable. Agree with Psych consult as patient is very anxious.   Case Manager aware that patient will need outpatient palliative services once discharged.   Palliative Medicine team will continue to support patient, family and medical team during hospitalization.   Code Status/Advance Care Planning:  Full code  Palliative Prophylaxis:   Frequent Pain Assessment  Additional Recommendations (Limitations, Scope, Preferences):  Full Scope Treatment-continue to treat the treatable   Psycho-social/Spiritual:   Desire for further Chaplaincy support:No   Prognosis:   Unable to determine-guarded to poor in the setting  of severe pulmonary radiation fibrosis, acute on chronic hypoxic respiratory failure, dyspnea on minimum exertion, oxygen dependent, anxiety, protein calorie malnutrition, decreased mobility, poor po intake.   Discharge Planning: Home with Palliative Services      Primary Diagnoses: Present on Admission: . Acute respiratory failure with hypoxia ([place])   I have reviewed the medical record, interviewed the patient and family, and examined the patient. The following aspects are pertinent.  Past Medical History:  Diagnosis Date  . Acquired hypothyroidism 07/01/2013  . Acute respiratory failure ([place]) 11/29/2014  . 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 Asthma   . Back pain with sciatica 05/07/2017  . Biliary sludge 12/19/2017  . Lymphoma, non-Hodgkin's ([place]) 06/30/2013  . Non Hodgkin's lymphoma ([place]) [id number]  . Personal history of chemotherapy   . Recurrent upper respiratory infection (URI) 09/01/2015  . Stenosis of subclavian artery ([place]) 11/29/2014    Social History   Socioeconomic History  . Marital status: Married    Spouse name: Not on file  . Number of children: 2  . Years of education: Not on file  . Highest education level: Not on file  Occupational History  . Occupation: Retired    Comment: Tour manager  . Financial resource strain: Not on file  . Food insecurity:    Worry: Not on file    Inability: Not on file  . Transportation needs:    Medical: Not on file    Non-medical: Not on file  Tobacco Use  . Smoking status: Never Smoker  . Smokeless tobacco: Never Used  Substance and Sexual Activity  . Alcohol use: Not Currently    Frequency: Never    Comment: a couple glasses of wine a year  . Drug use: No  . Sexual activity: Not on file  Lifestyle  . Physical activity:    Days per week: Not on file    Minutes per session: Not on file  . Stress: Not on file  Relationships  . Social connections:    Talks on phone: Not on file    Gets together: Not on file    Attends religious service: Not on file    Active member of club or organization: Not on file    Attends meetings of clubs or organizations: Not on file    Relationship status: Not on file  Other Topics Concern  . Not on file  Social History Narrative  . Not on file   Family History  Problem Relation Age of Onset  . Asthma Son   . Congestive Heart Failure Mother   . Diabetes Brother   . Pulmonary embolism Father   . Breast cancer Neg Hx    Scheduled Meds: . ALPRAZolam  0.5 mg Oral QHS  . aspirin EC  81 mg Oral Daily  . budesonide  0.5 mg Nebulization BID  . [START ON 01/07/2018] citalopram  30 mg Oral Daily  . enoxaparin (LOVENOX) injection  30 mg Subcutaneous Daily  . famotidine  20 mg Oral Daily  . feeding supplement (ENSURE ENLIVE)  237 mL Oral TID BM  . furosemide  20 mg Oral Daily  . ipratropium-albuterol  3 mL Nebulization Q6H  . levothyroxine  25 mcg Oral QAC breakfast  . methylPREDNISolone (SOLU-MEDROL) injection  40 mg  Intravenous Daily  . multivitamin with minerals  1 tablet Oral Daily  . nystatin  5 mL Oral QID  . pantoprazole  40 mg Oral Daily  . polyethylene glycol  17 g Oral Daily  .  potassium chloride SA  20 mEq Oral Daily  . vitamin C  500 mg Oral Daily   Continuous Infusions: PRN Meds:.acetaminophen, albuterol, ALPRAZolam, ondansetron (ZOFRAN) IV, ondansetron, traMADol Medications Prior to Admission:  Prior to Admission medications   Medication Sig Start Date End Date Taking? Authorizing Provider  ALPRAZolam (XANAX) 0.5 MG tablet TAKE 1 TABLET BY MOUTH AT BEDTIME AS NEEDED FOR SLEEP 12/10/16  Yes Flora Lipps, MD  aspirin EC 81 MG tablet Take 81 mg by mouth daily.   Yes [provider]  azithromycin (ZITHROMAX) 250 MG tablet Take 1 tablet (250 mg total) by mouth daily. Take 2 tabs at once on the first day, then once daily. 12/30/17  Yes [name], MD  budesonide (PULMICORT) 0.5 MG/2ML nebulizer solution Take 2 mLs (0.5 mg total) by nebulization 2 (two) times daily. 01/06/17  Yes Kasa, Maretta Bees, MD  citalopram (CELEXA) 20 MG tablet TAKE 1 TABLET BY MOUTH ONCE DAILY 12/16/17  Yes Cammie Sickle, MD  levothyroxine (SYNTHROID, LEVOTHROID) 25 MCG tablet TAKE 1 TABLET BY MOUTH ONCE DAILY ON AN EMPTY STOMACH. WAIT 30 MINUTES BEFORE TAKING OTHER MEDS. 12/03/17  Yes Cammie Sickle, MD  omeprazole (PRILOSEC) 20 MG capsule TAKE 1 CAPSULE BY MOUTH TWICE DAILY BEFORE MEALS 02/03/17  Yes Cammie Sickle, MD  potassium chloride SA (K-DUR,KLOR-CON) 20 MEQ tablet Take 1 tablet (20 mEq total) by mouth daily. 10/27/17  Yes Cammie Sickle, MD  predniSONE (STERAPRED UNI-PAK 21 TAB) 10 MG (21) TBPK tablet Take as directed. Patient taking differently: Take 10 mg by mouth taper from 4 doses each day to 1 dose and stop.  12/30/17  Yes [name], MD  vitamin C (ASCORBIC ACID) 500 MG tablet Take 500 mg by mouth daily.   Yes [provider]  albuterol (PROVENTIL  HFA;VENTOLIN HFA) 108 (90 BASE) MCG/ACT inhaler Inhale 1 puff into the lungs every 6 (six) hours as needed for wheezing or shortness of breath.    [provider]  albuterol (PROVENTIL) (2.5 MG/3ML) 0.083% nebulizer solution Take 3 mLs (2.5 mg total) by nebulization every 4 (four) hours as needed for wheezing or shortness of breath. 01/06/17   Flora Lipps, MD  AMBULATORY NON FORMULARY MEDICATION Medication Name: Incentive Spirometer Use 10-15 times daily 07/29/16   Flora Lipps, MD  formoterol (PERFOROMIST) 20 MCG/2ML nebulizer solution Take 2 mLs (20 mcg total) by nebulization 2 (two) times daily. Patient not taking: Reported on 01/02/2018 01/06/17   Flora Lipps, MD  ondansetron (ZOFRAN ODT) 4 MG disintegrating tablet Take 1 tablet (4 mg total) every 8 (eight) hours as needed by mouth for nausea or vomiting. Patient not taking: Reported on 01/02/2018 05/30/17   Anna Bow, MD  Respiratory Therapy Supplies (FLUTTER) DEVI Use 10-15 times daily 07/29/16   Flora Lipps, MD   Allergies  Allergen Reactions  . Hydrocodone-Homatropine Itching  . Tussionex Pennkinetic Er [Hydrocod Polst-Cpm Polst Er] Itching  . Ciprofloxacin Rash   Review of Systems  Constitutional: Positive for activity change, appetite change and fatigue.  Respiratory: Positive for cough and shortness of breath.   Neurological: Positive for weakness.  All other systems reviewed and are negative.   Physical Exam  Constitutional: She appears cachectic. She is cooperative. She appears ill.  Fragile in appearance   Cardiovascular: Normal rate, regular rhythm, normal heart sounds, intact distal pulses and normal pulses.  Pulmonary/Chest: She has rhonchi.  Shortness of breath, cough   Musculoskeletal:  Generalized weakness   Neurological: She is alert.  Skin: Skin is warm and dry.  Psychiatric: Judgment normal. Her mood appears anxious. Cognition and memory are normal.  Nursing note and vitals reviewed.   Vital Signs:  BP 102/61 (BP Location: Right Arm)   Pulse (!) 103   Temp 98.1 F (36.7 C) (Axillary) Comment (Src): didnt change location of temp on dinamap  Resp 20   Ht _0  (1.549 m)   Wt 54 kg (119 lb)   LMP  (LMP Unknown)   SpO2 99%   BMI 22.48 kg/m  Pain Scale: 0-10   Pain Score: 0-No pain   SpO2: SpO2: 99 % O2 Device:SpO2: 99 % O2 Flow Rate: .O2 Flow Rate (L/min): 3 L/min  IO: Intake/output summary:   Intake/Output Summary (Last 24 hours) at 01/06/2018 1553 Last data filed at 01/06/2018 0211 Gross per 24 hour  Intake 50 ml  Output 150 ml  Net -100 ml    LBM: Last BM Date: 01/05/18 Baseline Weight: Weight: 52.9 kg (116 lb 10 oz) Most recent weight: Weight: 54 kg (119 lb)     Palliative Assessment/Data:PPS 40%   Time In: 1115 Time Out: 1235 Time Total: 75 min.  Greater than 50%  of this time was spent counseling and coordinating care related to the above assessment and plan.  Signed by:  Alda Lea, NP-BC Palliative Medicine Team  Phone: [phone] Fax: [phone] Pager: [phone] Amion: Bjorn Pippin    Please contact Palliative Medicine Team phone at [phone] for questions and concerns.  For individual provider: See Shea Evans

## 2018-01-06 NOTE — Progress Notes (Addendum)
New referral for outpatient Palliative to follow at [facility] received from [place] following a Palliative Medicine consult. Plan is for discharge to [facility] tomorrow. Patient information faxed to referral.  Flo Shanks RN, BSN, [facility], hospital liaison [phone]  3:25 pm UPDATE: Per CSW Annamaria Boots patient will now be going home NOT to [facility]. Referral updated.

## 2018-01-06 NOTE — Clinical Social Work Note (Signed)
CSW met with patient and husband at bedside. CSW presented bed offers for SNF rehab. Patent states that she does not want to stay in a facility. She wants to return home and received home health. CSW made RN CM aware of above. CSW signing off. Please re consult for any additional needs.   [place], [place]

## 2018-01-06 NOTE — Progress Notes (Signed)
[facility] NAME: Anna Mcbride    MR#:  [id number]  DATE OF BIRTH:  Nov 24, 1945  SUBJECTIVE:  CHIEF COMPLAINT:  No chief complaint on file.  SOB present. Slept better overnight.  REVIEW OF SYSTEMS:    Review of Systems  Constitutional: Positive for diaphoresis and malaise/fatigue. Negative for chills and fever.  HENT: Negative for sore throat.   Eyes: Negative for blurred vision, double vision and pain.  Respiratory: Positive for cough, shortness of breath and wheezing. Negative for hemoptysis and sputum production.   Cardiovascular: Negative for chest pain, palpitations, orthopnea and leg swelling.  Gastrointestinal: Negative for abdominal pain, constipation, diarrhea, heartburn, nausea and vomiting.  Genitourinary: Negative for dysuria and hematuria.  Musculoskeletal: Negative for back pain and joint pain.  Skin: Negative for rash.  Neurological: Negative for sensory change, speech change, focal weakness and headaches.  Endo/Heme/Allergies: Does not bruise/bleed easily.  Psychiatric/Behavioral: Negative for depression. The patient is not nervous/anxious.    DRUG ALLERGIES:   Allergies  Allergen Reactions  . Hydrocodone-Homatropine Itching  . Tussionex Pennkinetic Er [Hydrocod Polst-Cpm Polst Er] Itching  . Ciprofloxacin Rash    VITALS:  Blood pressure 112/67, pulse 93, temperature 97.9 F (36.6 C), temperature source Oral, resp. rate (!) 22, height 5\' 1"  (1.549 m), weight 54 kg (119 lb), SpO2 97 %.  PHYSICAL EXAMINATION:   Physical Exam  GENERAL:  72 y.o.-year-old patient lying in the bed with resp distress EYES: Pupils equal, round, reactive to light and accommodation. No scleral icterus. Extraocular muscles intact.  HEENT: Head atraumatic, normocephalic. Oropharynx and nasopharynx clear.  NECK:  Supple, no jugular venous distention. No thyroid enlargement, no tenderness.  LUNGS: Increased Work of breathing.  Bilateral  wheezing and coarse breath sounds. CARDIOVASCULAR: S1, S2 normal. No murmurs, rubs, or gallops.  ABDOMEN: Soft, nontender, nondistended. Bowel sounds present. No organomegaly or mass.  EXTREMITIES: No cyanosis, clubbing or edema b/l.    NEUROLOGIC: Cranial nerves II through XII are intact. No focal Motor or sensory deficits b/l.   PSYCHIATRIC: The patient is alert and awake.  Anxious. SKIN: No obvious rash, lesion, or ulcer.   LABORATORY PANEL:   CBC Recent Labs  Lab 01/04/18 0510  WBC 5.5  HGB 11.3*  HCT 33.5*  PLT 168   ------------------------------------------------------------------------------------------------------------------ Chemistries  Recent Labs  Lab 01/02/18 1158  01/06/18 0800  NA 139   < > 141  K 4.3   < > 3.4*  CL 104   < > 101  CO2 24   < > 32  GLUCOSE 113*   < > 89  BUN 45*   < > 52*  CREATININE 0.96   < > 1.24*  CALCIUM 9.1   < > 8.6*  MG  --    < > 2.3  AST 47*  --   --   ALT 50  --   --   ALKPHOS 67  --   --   BILITOT 0.9  --   --    < > = values in this interval not displayed.   ------------------------------------------------------------------------------------------------------------------  Cardiac Enzymes No results for input(s): TROPONINI in the last 168 hours. ------------------------------------------------------------------------------------------------------------------  RADIOLOGY:  No results found. ASSESSMENT AND PLAN:   * Acute on chronic hypoxic respiratory failure secondary to bilateral pneumonia over chronic pulmonary fibrosis and radiation pneumonitis Still has significant shortness of breath.   IV zosyn Steroids and bronchodilator nebulizers. Appreciate pulmonary input  CHF in differential and echo  pending. Discussed with Dr. Nehemiah Massed  * Anxiety.  Xanax as needed.  * Non-Hodgkin's lymphoma in remission  * Intermittent right upper quadrant pain.  Ultrasound showed no gallstones or issues with gallbladder.  Discussed  with Dr. Rosana Hoes of surgery who saw the patient in the past..  Pain has resolved. This is likely due to pleurisy  * Mild hematuria due to foley trauma Resolved  DVT prophylaxis with Lovenox  All the records are reviewed and case discussed with Care Management/Social Worker Management plans discussed with the patient, family and they are in agreement.  CODE STATUS: FULL CODE  TOTAL TIME TAKING CARE OF THIS PATIENT: 35 minutes.   POSSIBLE D/C IN 2-3 DAYS, DEPENDING ON CLINICAL CONDITION.  Neita Carp M.D on 01/06/2018 at 11:50 AM  Between 7am to 6pm - Pager - [phone]  After 6pm go to [email or web address] - password [email or web address] [facility]  Office  [phone]  CC: Primary care physician; Baxter Hire, MD  Note: This dictation was prepared with Dragon dictation along with smaller phrase technology. Any transcriptional errors that result from this process are unintentional.

## 2018-01-06 NOTE — Consult Note (Signed)
[facility] Consult   Reason for Consult: Consult for 72 year old woman currently in the hospital with respiratory distress concern raised about anxiety Referring Physician: [place] [facility] Patient Identification: Anna Mcbride MRN:  [id number] Principal Diagnosis: Adjustment disorder with anxiety Diagnosis:   Patient Active Problem List   Diagnosis Date Noted  . Adjustment disorder with anxiety [F43.22] 01/06/2018  . Epigastric abdominal pain [R10.13]   . History of esophagitis [Z87.19] 12/29/2017  . GERD (gastroesophageal reflux disease) [K21.9] 12/19/2017  . Biliary sludge [K83.8] 12/19/2017  . CKD (chronic kidney disease), stage III ([facility] [place]) [N18.3] 12/19/2017  . Chronic respiratory failure with hypoxia, on home O2 therapy (HCC) [J96.11, Z99.81] 12/16/2017  . Back pain with sciatica [M54.9, M54.30] 05/07/2017  . Sepsis ([place]) [A41.9] 09/12/2016  . Pneumothorax [J93.9]   . Diffuse large B-cell lymphoma of lymph nodes of neck ([place]) [C83.31] 02/21/2016  . Recurrent upper respiratory infection (URI) [J06.9] 09/01/2015  . Acute respiratory failure with hypoxia ([place]) [J96.01]   . 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 SOB (shortness of breath) [R06.02]   . Pneumonia [J18.9]   . Acute respiratory failure ([place]) [J96.00] 11/29/2014  . History of digestive disease [Z87.19] 11/29/2014  . HLD (hyperlipidemia) [E78.5] 11/29/2014  . Adult hypothyroidism [E03.9] 11/29/2014  . Lymphoma ([place]) [C85.90] 11/29/2014  . RAD (reactive airway disease) [J45.909] 11/29/2014  . 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 Pain in shoulder [M25.519] 11/01/2014  . Fracture of humerus, proximal [S42.209A] 11/01/2014  . Febrile neutropenia ([place]) [D70.9, R50.81] 11/08/2013  . Decreased potassium in the blood [E87.6] 09/02/2013  . Acquired hypothyroidism [E03.9] 07/01/2013  . Lymphoma, non-Hodgkin's ([place]) [C85.90] 06/30/2013  . Cervical lymphadenopathy [R59.0] 04/20/2013  .  History of lymphoma [Z85.79] 04/20/2013  . Dyspnea [R06.00] 01/29/2011  . Cough [R05] 01/29/2011    Total Time spent with patient: 1 hour  Subjective:   Anna Mcbride is a 72 y.o. female patient admitted with "I get nervous".  HPI: Patient interviewed.  Chart reviewed.  72 year old woman with chronic respiratory problems related to her history of cancer who is in the hospital currently with acute respiratory distress.  Patient has been noted to have anxiety problems that seem to interfere with her breathing.  Patient admits that she gets panicky from time to time.  When she first came into the hospital it was happening more frequently.  Now it happens only occasionally but she is still aware of being more anxious than usual.  She is also having difficulty sleeping at night.  Patient is worried that she will not recover from her current respiratory condition.  Before this recent phase of illness she had been functioning socially very well and she is afraid she will not be able to return to that.  She says that this morning the hospitalist told her that she was no better than she was yesterday and she has taken this rather hard.  Patient denies feeling sad.  Denies any suicidal thought.  She does have difficulty sleeping at night and finds herself worrying that she will not be able to catch her breath when she falls asleep at night.  Her appetite remains somewhat low.  No psychotic symptoms.  Patient had been taking citalopram 20 mg a day for many years prior to hospitalization and also Xanax which she only used infrequently as a as needed for sleep.  Social history: Married.  Their 50th wedding anniversary was this last week and because of the hospitalization the patient missed a  gigantic surprise anniversary party that her daughter held.  This is also causing her some distress.  Medical history: History of 2 bouts of cancer has had radiation and chemotherapy.  Has chronic lung problems.  Substance  abuse history: Does not drink at all no history of drug or alcohol abuse at all.  She admits to having vague worries that if she uses the Xanax she will get "hooked" on it.  Past Psychiatric History: Patient started getting treated for depressive symptoms the first time she had cancer in 1999.  Citalopram 20 mg a day has been constant pretty much since then.  No other treatment for depression.  No hospitalization no history of suicide attempts or psychosis.  Risk to Self:   Risk to Others:   Prior Inpatient Therapy:   Prior Outpatient Therapy:    Past Medical History:  Past Medical History:  Diagnosis Date  . Acquired hypothyroidism 07/01/2013  . Acute respiratory failure ([place]) 11/29/2014  . 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 Asthma   . Back pain with sciatica 05/07/2017  . Biliary sludge 12/19/2017  . Lymphoma, non-Hodgkin's ([place]) 06/30/2013  . Non Hodgkin's lymphoma ([place]) [id number]  . Personal history of chemotherapy   . Recurrent upper respiratory infection (URI) 09/01/2015  . Stenosis of subclavian artery ([place]) 11/29/2014    Past Surgical History:  Procedure Laterality Date  . 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 NASAL SINUS SURGERY  08/07/05 09/09/05   x 2   . VIDEO BRONCHOSCOPY Bilateral 11/30/2014   Procedure: VIDEO BRONCHOSCOPY WITHOUT FLUORO;  Surgeon: Flora Lipps, MD;  Location: [facility] ORS;  Service: Cardiopulmonary;  Laterality: Bilateral;   Family History:  Family History  Problem Relation Age of Onset  . Asthma Son   . Congestive Heart Failure Mother   . Diabetes Brother   . Pulmonary embolism Father   . Breast cancer Neg Hx    Family Psychiatric  History: None noted Social History:  Social History   Substance and Sexual Activity  Alcohol Use Not Currently  . Frequency: Never   Comment: a couple glasses of wine a year     Social History   Substance and Sexual Activity  Drug Use No    Social History   Socioeconomic History  . Marital status: Married     Spouse name: Not on file  . Number of children: 2  . Years of education: Not on file  . Highest education level: Not on file  Occupational History  . Occupation: Retired    Comment: Tour manager  . Financial resource strain: Not on file  . Food insecurity:    Worry: Not on file    Inability: Not on file  . Transportation needs:    Medical: Not on file    Non-medical: Not on file  Tobacco Use  . Smoking status: Never Smoker  . Smokeless tobacco: Never Used  Substance and Sexual Activity  . Alcohol use: Not Currently    Frequency: Never    Comment: a couple glasses of wine a year  . Drug use: No  . Sexual activity: Not on file  Lifestyle  . Physical activity:    Days per week: Not on file    Minutes per session: Not on file  . Stress: Not on file  Relationships  . Social connections:    Talks on phone: Not on file    Gets together: Not on file    Attends religious service: Not on file  Active member of club or organization: Not on file    Attends meetings of clubs or organizations: Not on file    Relationship status: Not on file  Other Topics Concern  . Not on file  Social History Narrative  . Not on file   Additional Social History:    Allergies:   Allergies  Allergen Reactions  . Hydrocodone-Homatropine Itching  . Tussionex Pennkinetic Er [Hydrocod Polst-Cpm Polst Er] Itching  . Ciprofloxacin Rash    Labs:  Results for orders placed or performed during the hospital encounter of 01/02/18 (from the past 48 hour(s))  Basic metabolic panel     Status: Abnormal   Collection Time: 01/06/18  8:00 AM  Result Value Ref Range   Sodium 141 135 - 145 mmol/L   Potassium 3.4 (L) 3.5 - 5.1 mmol/L   Chloride 101 101 - 111 mmol/L   CO2 32 22 - 32 mmol/L   Glucose, Bld 89 65 - 99 mg/dL   BUN 52 (H) 6 - 20 mg/dL   Creatinine, Ser 1.24 (H) 0.44 - 1.00 mg/dL   Calcium 8.6 (L) 8.9 - 10.3 mg/dL   GFR calc non Af Amer 42 (L) >60 mL/min   GFR calc Af Amer 48  (L) >60 mL/min    Comment: (NOTE) The eGFR has been calculated using the CKD EPI equation. This calculation has not been validated in all clinical situations. eGFR's persistently <60 mL/min signify possible Chronic Kidney Disease.    Anion gap 8 5 - 15    Comment: Performed at [facility], [address] [place], [place] [address]  Magnesium     Status: None   Collection Time: 01/06/18  8:00 AM  Result Value Ref Range   Magnesium 2.3 1.7 - 2.4 mg/dL    Comment: Performed at [facility], [address] [place], [place] [address]    Current Facility-Administered Medications  Medication Dose Route Frequency Provider Last Rate Last Dose  . acetaminophen (TYLENOL) tablet 650 mg  650 mg Oral Q6H PRN Sudini, Srikar, MD      . albuterol (PROVENTIL) (2.5 MG/3ML) 0.083% nebulizer solution 2.5 mg  2.5 mg Nebulization Q4H PRN Tukov-Yual, Magdalene S, NP      . ALPRAZolam (XANAX) tablet 0.25 mg  0.25 mg Oral Q6H PRN Clapacs, John T, MD      . ALPRAZolam Duanne Moron) tablet 0.5 mg  0.5 mg Oral QHS Clapacs, John T, MD      . aspirin EC tablet 81 mg  81 mg Oral Daily Tukov-Yual, Magdalene S, NP   81 mg at 01/06/18 0821  . budesonide (PULMICORT) nebulizer solution 0.5 mg  0.5 mg Nebulization BID Tukov-Yual, Magdalene S, NP   0.5 mg at 01/06/18 0747  . [START ON 01/07/2018] citalopram (CELEXA) tablet 30 mg  30 mg Oral Daily Clapacs, John T, MD      . enoxaparin (LOVENOX) injection 30 mg  30 mg Subcutaneous Daily Tukov-Yual, Magdalene S, NP   30 mg at 01/06/18 0350  . famotidine (PEPCID) tablet 20 mg  20 mg Oral Daily Tukov-Yual, Magdalene S, NP   20 mg at 01/06/18 0938  . feeding supplement (ENSURE ENLIVE) (ENSURE ENLIVE) liquid 237 mL  237 mL Oral TID BM Tukov-Yual, Magdalene S, NP   237 mL at 01/06/18 0829  . furosemide (LASIX) tablet 20 mg  20 mg Oral Daily Tukov-Yual, Magdalene S, NP   20 mg at 01/06/18 1829  . ipratropium-albuterol (DUONEB) 0.5-2.5 (3) MG/3ML nebulizer solution 3  mL   3 mL Nebulization Q6H Tukov-Yual, Magdalene S, NP   3 mL at 01/06/18 1340  . levothyroxine (SYNTHROID, LEVOTHROID) tablet 25 mcg  25 mcg Oral QAC breakfast Tukov-Yual, Magdalene S, NP   25 mcg at 01/06/18 0821  . methylPREDNISolone sodium succinate (SOLU-MEDROL) 40 mg/mL injection 40 mg  40 mg Intravenous Daily [name], MD   40 mg at 01/06/18 0823  . multivitamin with minerals tablet 1 tablet  1 tablet Oral Daily Tukov-Yual, Magdalene S, NP   1 tablet at 01/06/18 5993  . nystatin (MYCOSTATIN) 100000 UNIT/ML suspension 500,000 Units  5 mL Oral QID Tukov-Yual, Magdalene S, NP   500,000 Units at 01/06/18 0823  . ondansetron (ZOFRAN) injection 4 mg  4 mg Intravenous Q6H PRN Tukov-Yual, Magdalene S, NP   4 mg at 01/05/18 0832  . ondansetron (ZOFRAN) tablet 4 mg  4 mg Oral Q8H PRN Tukov-Yual, Magdalene S, NP   4 mg at 01/03/18 0701  . pantoprazole (PROTONIX) EC tablet 40 mg  40 mg Oral Daily Tukov-Yual, Magdalene S, NP   40 mg at 01/06/18 0821  . polyethylene glycol (MIRALAX / GLYCOLAX) packet 17 g  17 g Oral Daily Hillary Bow, MD   17 g at 01/06/18 5701  . potassium chloride SA (K-DUR,KLOR-CON) CR tablet 20 mEq  20 mEq Oral Daily Tukov-Yual, Magdalene S, NP   20 mEq at 01/06/18 7793  . traMADol (ULTRAM) tablet 50 mg  50 mg Oral Q6H PRN Tukov-Yual, Magdalene S, NP      . vitamin C (ASCORBIC ACID) tablet 500 mg  500 mg Oral Daily Tukov-Yual, Magdalene S, NP   500 mg at 01/06/18 9030   Facility-Administered Medications Ordered in Other Encounters  Medication Dose Route Frequency Provider Last Rate Last Dose  . heparin lock flush 100 unit/mL  500 Units Intravenous Once Charlaine Dalton R, MD      . sodium chloride flush (NS) 0.9 % injection 10 mL  10 mL Intravenous Once Cammie Sickle, MD        Musculoskeletal: Strength & Muscle Tone: decreased Gait & Station: unsteady Patient leans: N/A  Psychiatric Specialty Exam: Physical Exam  Nursing note and vitals  reviewed. Constitutional: She appears well-developed and well-nourished.  HENT:  Head: Normocephalic and atraumatic.  Eyes: Pupils are equal, round, and reactive to light. Conjunctivae are normal.  Neck: Normal range of motion.  Cardiovascular: Regular rhythm and normal heart sounds.  Respiratory: She is in respiratory distress.  GI: Soft.  Musculoskeletal: Normal range of motion.  Neurological: She is alert.  Skin: Skin is warm and dry.  Psychiatric: Her speech is normal and behavior is normal. Judgment and thought content normal. Her mood appears anxious. Cognition and memory are normal.    Review of Systems  Constitutional: Negative.   HENT: Negative.   Eyes: Negative.   Respiratory: Positive for shortness of breath.   Cardiovascular: Negative.   Gastrointestinal: Negative.   Musculoskeletal: Negative.   Skin: Negative.   Neurological: Negative.   Psychiatric/Behavioral: Negative for depression, hallucinations, memory loss, substance abuse and suicidal ideas. The patient is nervous/anxious and has insomnia.     Blood pressure 102/61, pulse (!) 103, temperature 98.1 F (36.7 C), temperature source Axillary, resp. rate 20, height '5\' 1"'$  (1.549 m), weight 54 kg (119 lb), SpO2 99 %.Body mass index is 22.48 kg/m.  General Appearance: Fairly Groomed  Eye Contact:  Good  Speech:  Clear and Coherent  Volume:  Normal  Mood:  Anxious  Affect:  Congruent  Thought Process:  Goal Directed  Orientation:  Full (Time, Place, and Person)  Thought Content:  Logical  Suicidal Thoughts:  No  Homicidal Thoughts:  No  Memory:  Immediate;   Fair Recent;   Fair Remote;   Fair  Judgement:  Fair  Insight:  Fair  Psychomotor Activity:  Normal  Concentration:  Concentration: Fair  Recall:  [employer] of Knowledge:  Fair  Language:  Fair  Akathisia:  No  Handed:  Right  AIMS (if indicated):     Assets:  Desire for Improvement Housing Resilience Social Support  ADL's:  Impaired   Cognition:  WNL  Sleep:        Treatment Plan Summary: Medication management and Plan 72 year old woman with anxiety symptoms in reaction to the worsening of her physical health and her understandable worries.  This on top of what sounds like some chronic mild anxiety and depression.  Talked with the patient about managing her anxiety and trying to focus on being optimistic particularly when doing rehabilitation.  I reassured her that she was not going to get "hooked" on the Xanax if she needed to use it temporarily in the hospital.  My suggestion is that we make the Xanax 0.5 mg a standing thing at bedtime and then give her 1/4 mg as needed which she can use if she is feeling panicky or if she thinks it would help her do physical therapy better.  Additionally I am going to increase the citalopram to 30 mg a day which I think should be well tolerated and may help better with the anxiety.  Patient and husband agreeable to the plan.  I will follow-up as needed.  Disposition: No evidence of imminent risk to self or others at present.   Patient does not meet criteria for psychiatric inpatient admission. Supportive therapy provided about ongoing stressors.  Alethia Berthold, MD 01/06/2018 3:00 PM

## 2018-01-06 NOTE — Progress Notes (Signed)
OT Cancellation Note  Patient Details Name: Anna Mcbride MRN: [id number] DOB: 27-Nov-1945   Cancelled Treatment:    Reason Eval/Treat Not Completed: Other (comment). Pt meeting with palliative upon initial attempt to treat this am. Will re-attempt at later date/time as pt is available.  Jeni Salles, MPH, MS, OTR/L ascom [phone] 01/06/18, 11:48 AM

## 2018-01-06 NOTE — Clinical Social Work Note (Signed)
Clinical Social Work Assessment  Patient Details  Name: Anna Mcbride MRN: [id number] Date of Birth: Feb 19, 1946  Date of referral:  01/06/18               Reason for consult:  Facility Placement                Permission sought to share information with:  Case Manager, Customer service manager, Family Supports Permission granted to share information::  Yes, Verbal Permission Granted  Name::      SNF  Agency::   [facility]  Relationship::     Contact Information:     Housing/Transportation Living arrangements for the past 2 months:  Single Family Home Source of Information:  Patient Patient Interpreter Needed:  None Criminal Activity/Legal Involvement Pertinent to Current Situation/Hospitalization:  No - Comment as needed Significant Relationships:  Adult Children, Spouse Lives with:  Spouse Do you feel safe going back to the place where you live?  Yes Need for family participation in patient care:  Yes (Comment)  Care giving concerns:  Patient lives with her husband in [place] / plan:  CSW met with patient to discuss discharge planning. Patient's daughter was at bedside. CSW introduced self and explained role. CSW explained that PT recommendation is SNF for rehab. Patient and daughter had several questions about SNF vs. Home Health. CSW explained the differences and provided emotional support. Patient is in agreement with going to SNF and states preference is [facility] due to location and proximity to her husband. CSW will follow for discharge planning   Employment status:  Retired Nurse, adult PT Recommendations:  [place] / Referral to community resources:  [place]  Patient/Family's Response to care:  Patient is pleasant but a little anxious due to medical conditions and being in the hospital   Patient/Family's Understanding of and Emotional Response to  Diagnosis, Current Treatment, and Prognosis:  Family at bedside. CSW provided emotional support and education. Family is in agreement with plan and thanked CSW for assistance.   Emotional Assessment Appearance:  Appears stated age Attitude/Demeanor/Rapport:    Affect (typically observed):  Anxious, Restless, Accepting Orientation:  Oriented to Self, Oriented to Place, Oriented to  Time Alcohol / Substance use:  Not Applicable Psych involvement (Current and /or in the community):  No (Comment)  Discharge Needs  Concerns to be addressed:  Discharge Planning Concerns Readmission within the last 30 days:  No Current discharge risk:  None Barriers to Discharge:  Continued Medical Work up   [employer], [place] 01/06/2018, 11:22 AM

## 2018-01-06 NOTE — Progress Notes (Signed)
[facility] Encounter Note  Patient: Anna Mcbride / Admit Date: 01/02/2018 / Date of Encounter: 01/06/2018, 5:38 PM   Subjective: Patient has slight improvements of symptoms overnight.  Still very short of breath with diffuse rhonchi and concerning for continued pneumonia pulmonary fibrosis and pneumonitis.  Patient does have pleural effusions which are small is likely related to infection pneumonitis and fibrosis rather than congestive heart failure. Echocardiogram with normal LV systolic function and no current evidence of significant valvular heart disease contributing to above  Review of Systems: Positive for: Shortness of breath cough congestion Negative for: Vision change, hearing change, syncope, dizziness, nausea, vomiting,diarrhea, bloody stool, stomach pain, acid for cough, congestion, negative for diaphoresis, urinary frequency, urinary pain,skin lesions, skin rashes Others previously listed  Objective: Telemetry: Normal sinus rhythm Physical Exam: Blood pressure 102/61, pulse (!) 103, temperature 98.1 F (36.7 C), temperature source Axillary, resp. rate 20, height 5\' 1"  (1.549 m), weight 119 lb (54 kg), SpO2 99 %. Body mass index is 22.48 kg/m. General: Well developed, well nourished, in no acute distress. Head: Normocephalic, atraumatic, sclera non-icteric, no xanthomas, nares are without discharge. Neck: No apparent masses Lungs: Normal respirations with some wheezes, diffuse rhonchi, no rales , few basilar crackles   Heart: Regular rate and rhythm, normal S1 S2, no murmur, no rub, no gallop, PMI is normal size and placement, carotid upstroke normal without bruit, jugular venous pressure normal Abdomen: Soft, non-tender, non-distended with normoactive bowel sounds. No hepatosplenomegaly. Abdominal aorta is normal size without bruit Extremities: No edema, no clubbing, no cyanosis, no ulcers,  Peripheral: 2+ radial, 2+ femoral, 2+ dorsal pedal  pulses Neuro: Alert and oriented. Moves all extremities spontaneously. Psych:  Responds to questions appropriately with a normal affect.   Intake/Output Summary (Last 24 hours) at 01/06/2018 1738 Last data filed at 01/06/2018 0211 Gross per 24 hour  Intake 50 ml  Output -  Net 50 ml    Inpatient Medications:  . ALPRAZolam  0.5 mg Oral QHS  . aspirin EC  81 mg Oral Daily  . budesonide  0.5 mg Nebulization BID  . [START ON 01/07/2018] citalopram  30 mg Oral Daily  . enoxaparin (LOVENOX) injection  30 mg Subcutaneous Daily  . famotidine  20 mg Oral Daily  . feeding supplement (ENSURE ENLIVE)  237 mL Oral TID BM  . furosemide  20 mg Oral Daily  . ipratropium-albuterol  3 mL Nebulization Q6H  . levothyroxine  25 mcg Oral QAC breakfast  . methylPREDNISolone (SOLU-MEDROL) injection  40 mg Intravenous Daily  . multivitamin with minerals  1 tablet Oral Daily  . nystatin  5 mL Oral QID  . pantoprazole  40 mg Oral Daily  . polyethylene glycol  17 g Oral Daily  . potassium chloride SA  20 mEq Oral Daily  . vitamin C  500 mg Oral Daily   Infusions:   Labs: Recent Labs    01/04/18 0510 01/06/18 0800  NA 140 141  K 3.3* 3.4*  CL 103 101  CO2 28 32  GLUCOSE 140* 89  BUN 55* 52*  CREATININE 1.26* 1.24*  CALCIUM 8.2* 8.6*  MG 2.6* 2.3  PHOS 4.9*  --    No results for input(s): AST, ALT, ALKPHOS, BILITOT, PROT, ALBUMIN in the last 72 hours. Recent Labs    01/04/18 0510  WBC 5.5  HGB 11.3*  HCT 33.5*  MCV 99.0  PLT 168   No results for input(s): CKTOTAL, CKMB, TROPONINI in the last 72  hours. Invalid input(s): POCBNP No results for input(s): HGBA1C in the last 72 hours.   Weights: Filed Weights   01/02/18 1111 01/03/18 1643 01/05/18 0627  Weight: 116 lb 10 oz (52.9 kg) 119 lb 7.8 oz (54.2 kg) 119 lb (54 kg)     Radiology/Studies:  Dg Chest 2 View  Result Date: 01/03/2018 CLINICAL DATA:  Worsening shortness of breath EXAM: CHEST - 2 VIEW COMPARISON:  01/02/2018  FINDINGS: Left Port-A-Cath remains in place, unchanged. Right apical pleural thickening again noted, stable. Bilateral airspace opacities again noted, throughout much of the right lung and in the left mid and upper lung. These have worsened slightly since prior study. Probable small effusions. No acute bony abnormality. IMPRESSION: Bilateral airspace opacities have worsened since prior study concerning for worsening pneumonia. Small bilateral effusions. Electronically Signed   By: Rolm Baptise M.D.   On: 01/03/2018 15:41   Dg Chest 2 View  Result Date: 12/17/2017 CLINICAL DATA:  Shortness of breath. History of interstitial lung disease and non-Hodgkin's lymphoma EXAM: CHEST - 2 VIEW COMPARISON:  May 29, 2017 and Nov 26, 2016 FINDINGS: There is fibrosis in both upper lobes with scarring in the right upper lobe. There is also scarring in the right base region. There is moderate volume loss on the right. There is subtle increased opacity in the left mid lung compared to prior studies, concerning for potential early pneumonia in this area. No other new opacity evident. Heart size is normal. There is retraction in both hilar regions with distortion of pulmonary vascularity bilaterally, stable. No adenopathy is evident by radiography. Port-A-Cath tip is in the superior vena cava. No pneumothorax. No blastic or lytic bone lesions. There is an old healed fracture of the sternum. IMPRESSION: Subtle increased opacity in the left mid lung which may represent early pneumonia. There is increased opacity in this area compared to prior studies. Elsewhere there is extensive scarring, more severe on the right than on the left, which is stable. Moderate volume loss on the left. Stable cardiac silhouette. No adenopathy is appreciable on this study. Electronically Signed   By: Lowella Grip III M.D.   On: 12/17/2017 08:33   Ct Angio Chest Pe W Or Wo Contrast  Result Date: 12/18/2017 CLINICAL DATA:  Lymphoma.  Worsening  breathing. EXAM: CT ANGIOGRAPHY CHEST WITH CONTRAST TECHNIQUE: Multidetector CT imaging of the chest was performed using the standard protocol during bolus administration of intravenous contrast. Multiplanar CT image reconstructions and MIPs were obtained to evaluate the vascular anatomy. CONTRAST:  67mL ISOVUE-370 IOPAMIDOL (ISOVUE-370) INJECTION 76% COMPARISON:  06/18/2016 FINDINGS: Cardiovascular: Preferential opacification of the thoracic aorta. No evidence of thoracic aortic aneurysm or dissection. Normal heart size. No pericardial effusion. No pericardial effusion. Mediastinum/Nodes: Small scattered mediastinal nodes. Stable left subclavian Port-A-Cath device. Lungs/Pleura: Chronic pleural and parenchymal changes at the right apex are stable. The previously visualized right pneumothorax has resolved. Patchy ground-glass opacities throughout the left lung have markedly improved. Bronchiectasis in the lingula is present and not significantly changed. No new dominant pulmonary parenchymal mass. Upper Abdomen: Central right lobe liver cyst is stable. Musculoskeletal: No vertebral compression deformity. There is a healing fracture of the sternum with deformity. Review of the MIP images confirms the above findings. IMPRESSION: No evidence of acute pulmonary thromboembolism. Narrowing of the left subclavian and innominate arteries as described. Stable pleural and parenchymal changes at the right apex related to the patient's known lymphoma. Improved ground-glass opacities throughout the left lung is supporting inflammatory etiology. Aortic Atherosclerosis (  ICD10-I70.0). Electronically Signed   By: Marybelle Killings M.D.   On: 12/18/2017 14:08   Ct Abdomen Pelvis W Contrast  Result Date: 12/19/2017 CLINICAL DATA:  Nausea and vomiting. EXAM: CT ABDOMEN AND PELVIS WITH CONTRAST TECHNIQUE: Multidetector CT imaging of the abdomen and pelvis was performed using the standard protocol following bolus administration of  intravenous contrast. CONTRAST:  66mL ISOVUE-300 IOPAMIDOL (ISOVUE-300) INJECTION 61% COMPARISON:  February 18, 2017 FINDINGS: Lower chest: No acute abnormality. Hepatobiliary: Periportal edema seen in the liver. A hepatic cyst is identified. High attenuation material seen in the gallbladder. The portal vein is patent. Pancreas: Unremarkable. No pancreatic ductal dilatation or surrounding inflammatory changes. Spleen: Normal in size without focal abnormality. Adrenals/Urinary Tract: Adrenal glands are normal. Parapelvic cysts on the left. The kidneys are otherwise unremarkable. Contrast in the bladder is likely due to the CT of the chest from yesterday. No ureteral stones or obstruction identified. Stomach/Bowel: The stomach and small bowel are normal. Colonic diverticulosis is seen without diverticulitis. The cecum lies deep within the pelvis. The appendix is normal. Fecal loading identified. Vascular/Lymphatic: Atherosclerotic changes are seen in the nonaneurysmal aorta. No adenopathy. Reproductive: Uterus and bilateral adnexa are unremarkable. Other: No abdominal wall hernia or abnormality. No abdominopelvic ascites. Musculoskeletal: No acute or significant osseous findings. IMPRESSION: 1. No acute abnormalities identified. 2. Periportal edema in the liver is nonspecific. 3. High attenuation the gallbladder could represent sludge or stones. No wall thickening. 4. Colonic diverticulosis without diverticulitis. 5. Atherosclerotic changes in the nonaneurysmal aorta. Electronically Signed   By: Dorise Bullion III M.D   On: 12/19/2017 21:27   Dg Chest Port 1 View  Result Date: 01/02/2018 CLINICAL DATA:  Shortness of breath.  Non-Hodgkin's lymphoma. EXAM: PORTABLE CHEST 1 VIEW COMPARISON:  Dec 19, 2017 FINDINGS: Increased infiltrate throughout the left mid and upper lung. Increased infiltrate diffusely throughout the right lung. Increased pleural thickening or fluid at the right apex. The cardiomediastinal silhouette  is stable. A Port-A-Cath is in stable position. IMPRESSION: 1. New infiltrates throughout the upper half of the left lung and diffusely throughout the right lung. New thickening towards the right apex could represent pleural thickening versus pleural fluid. Electronically Signed   By: Dorise Bullion III M.D   On: 01/02/2018 18:41   Dg Chest Portable 1 View  Result Date: 12/19/2017 CLINICAL DATA:  Chest pain EXAM: PORTABLE CHEST 1 VIEW COMPARISON:  12/16/17 FINDINGS: Cardiac shadow is stable. Left chest wall port is noted. Chronic changes in the apices are again seen. No focal infiltrate or sizable effusion is noted. The previously seen changes on the left are not well appreciated on this exam. No acute bony abnormality is noted. IMPRESSION: No acute abnormality seen. Chronic changes in the apices are again seen. Electronically Signed   By: Inez Catalina M.D.   On: 12/19/2017 18:58   Mm 3d Screen Breast Bilateral  Result Date: 12/09/2017 CLINICAL DATA:  Screening. EXAM: DIGITAL SCREENING BILATERAL MAMMOGRAM WITH TOMO AND CAD COMPARISON:  Previous exam(s). ACR Breast Density Category c: The breast tissue is heterogeneously dense, which may obscure small masses. FINDINGS: There are no findings suspicious for malignancy. Images were processed with CAD. IMPRESSION: No mammographic evidence of malignancy. A result letter of this screening mammogram will be mailed directly to the patient. RECOMMENDATION: Screening mammogram in one year. (Code:SM-B-01Y) BI-RADS CATEGORY  1: Negative. Electronically Signed   By: Fidela Salisbury M.D.   On: 12/09/2017 14:37   US Abdomen Limited Ruq  Result Date: 01/02/2018  CLINICAL DATA:  Right upper quadrant pain EXAM: ULTRASOUND ABDOMEN LIMITED RIGHT UPPER QUADRANT COMPARISON:  CT 12/19/2017 FINDINGS: Gallbladder: No gallstones or wall thickening visualized. No sonographic Murphy sign noted by sonographer. Common bile duct: Diameter: Normal caliber, 1 mm Liver: Small  hyperechoic lesions in the right hepatic lobe, measuring 1.3 cm and 0.8 cm, likely small hemangiomas. Portal vein is patent on color Doppler imaging with normal direction of blood flow towards the liver. Right pleural effusion. IMPRESSION: Small hyperechoic foci within the right hepatic lobe most compatible with small hemangiomas. Right pleural effusion. Electronically Signed   By: Rolm Baptise M.D.   On: 01/02/2018 15:17     Assessment and Recommendation  72 y.o. female with history of cancer status post multiple rounds of chemotherapy bone marrow and radiation with bilateral pneumonia and pneumonitis with bilateral pleural effusions likely secondary to primary pulmonary issues listed above rather than cardiovascular disease with normal LV function by echocardiogram 1.  Continue supportive care for infection pneumonitis and pulmonary fibrosis 2.  No further cardiac intervention and or diagnostics necessary at this time 3.  Okay for Lasix for pleural effusions if helpful 4.  Follow further questions  Signed, Serafina Royals M.D. FACC

## 2018-01-06 NOTE — Progress Notes (Signed)
Occupational Therapy Treatment Patient Details Name: Anna Mcbride MRN: [id number] DOB: 07-Feb-1946 Today's Date: 01/06/2018    History of present illness 72yo female pt presented to ER secondary to worsening SOB, desaturation; admitted for management of bilat PNA, chronic pulmonary fibrosis, radiation pneumonitis and symptomatic cholelithiasis (pending surgery consult).   OT comments  Pt seen for OT treatment this date. Pt agreeable, spouse in room and actively participates t/o session. Pt reports feeling a little better, spouse endorses the same. Pt could not locate her energy conservation handout from previous date. New handout supplied. Pt/spouse educated in energy conservation strategies to build on previous date's education to support recall and carryover into daily routines (see below for additional detail). Spouse reports plans in place to purchase some AE/DME, including plans for installing a handheld shower head soon. Introduced and educated pt/spouse on use of a wedge pillow to increase UB elevation while in bed to improve breathing/minimize SOB overnight. Pt and spouse verbalized strong interest in this. Pt/spouse report that pt has changed her mind and has decided to return home with home health services instead of go to [facility]. If pt does go home, she will benefit from continued skilled [place] services to address current impairments and functional deficits to maximize return to PLOF, minimize falls risk, and minimize caregiver burden while assisting pt in participate in basic self care as well as meaningful occupations that she has been unable to participate in 2:2 SOB/fatigue, such as baking cakes (pt states she misses this the most). Will continue to progress.   Follow Up Recommendations  SNF    Equipment Recommendations  3 in 1 bedside commode;Other (comment)(HH shower head, reacher, sock aide, non-skid tape for shower floor, wedge pillow)    Recommendations for Other Services       Precautions / Restrictions Precautions Precautions: Other (comment) Precaution Comments: monitor HR and O2 sats Restrictions Weight Bearing Restrictions: No       Mobility Bed Mobility                  Transfers                      Balance                                           ADL either performed or assessed with clinical judgement   ADL Overall ADL's : Needs assistance/impaired                                             Vision Baseline Vision/History: Wears glasses Wears Glasses: At all times Patient Visual Report: No change from baseline     Perception     Praxis      Cognition Arousal/Alertness: Awake/alert Behavior During Therapy: WFL for tasks assessed/performed Overall Cognitive Status: Within Functional Limits for tasks assessed                                          Exercises Other Exercises Other Exercises: Pt/spouse educated in AE/DME including wedge pillow, anti-skid tape for shower floor, positioning of grab bars and handheld showerhead in the shower, falls prevention strategies.  Other Exercises: Pt/spouse educated in energy conservation strategies including activity pacing, rest breaks, home/routines modifications, and positioning. New handout provided as pt cannot locate her first one.    Shoulder Instructions       General Comments      Pertinent Vitals/ Pain       Pain Assessment: No/denies pain  Home Living                                          Prior Functioning/Environment              Frequency  Min 2X/week        Progress Toward Goals  OT Goals(current goals can now be found in the care plan section)  Progress towards OT goals: Progressing toward goals  Acute Rehab OT Goals Patient Stated Goal: get better and be able to go home OT Goal Formulation: With patient Time For Goal Achievement: 01/19/18 Potential to Achieve  Goals: Good  Plan Discharge plan remains appropriate;Frequency remains appropriate    Co-evaluation                 AM-PAC PT "6 Clicks" Daily Activity     Outcome Measure   Help from another person eating meals?: None Help from another person taking care of personal grooming?: A Little Help from another person toileting, which includes using toliet, bedpan, or urinal?: A Little Help from another person bathing (including washing, rinsing, drying)?: A Lot Help from another person to put on and taking off regular upper body clothing?: A Little Help from another person to put on and taking off regular lower body clothing?: A Lot 6 Click Score: 17    End of Session Equipment Utilized During Treatment: Oxygen(3L)  OT Visit Diagnosis: Other abnormalities of gait and mobility (R26.89);Muscle weakness (generalized) (M62.81);History of falling (Z91.81)   Activity Tolerance Patient tolerated treatment well   Patient Left in bed;with call bell/phone within reach;with bed alarm set;with family/visitor present   Nurse Communication          Time: 7903-8333 OT Time Calculation (min): 27 min  Charges: OT General Charges $OT Visit: 1 Visit OT Treatments $Self Care/Home Management : 23-37 mins   Jeni Salles, MPH, MS, OTR/L ascom [phone] 01/06/18, 5:06 PM

## 2018-01-07 MED ORDER — PREDNISONE 20 MG PO TABS
40.0000 mg | ORAL_TABLET | Freq: Every day | ORAL | Status: DC
  Administered 2018-01-08: 40 mg via ORAL
  Filled 2018-01-07: qty 2

## 2018-01-07 NOTE — Care Management Important Message (Signed)
Important Message  Patient Details  Name: Anna Mcbride MRN: [id number] Date of Birth: Feb 08, 1946   Medicare Important Message Given:  Yes    Juliann Pulse A Jamear Carbonneau 01/07/2018, 11:18 AM

## 2018-01-07 NOTE — Progress Notes (Signed)
[facility] [place] NAME: Anna Mcbride    MR#:  [id number]  DATE OF BIRTH:  [birth year]  SUBJECTIVE:  CHIEF COMPLAINT:  No chief complaint on file.  SOB present.  Sitting in a chair. wants to ambulate today. Husband at bedside on 3 L oxygen  REVIEW OF SYSTEMS:    Review of Systems  Constitutional: Positive for diaphoresis and malaise/fatigue. Negative for chills and fever.  HENT: Negative for sore throat.   Eyes: Negative for blurred vision, double vision and pain.  Respiratory: Positive for cough, shortness of breath and wheezing. Negative for hemoptysis and sputum production.   Cardiovascular: Negative for chest pain, palpitations, orthopnea and leg swelling.  Gastrointestinal: Negative for abdominal pain, constipation, diarrhea, heartburn, nausea and vomiting.  Genitourinary: Negative for dysuria and hematuria.  Musculoskeletal: Negative for back pain and joint pain.  Skin: Negative for rash.  Neurological: Negative for sensory change, speech change, focal weakness and headaches.  Endo/Heme/Allergies: Does not bruise/bleed easily.  Psychiatric/Behavioral: Negative for depression. The patient is not nervous/anxious.    DRUG ALLERGIES:   Allergies  Allergen Reactions  . Hydrocodone-Homatropine Itching  . Tussionex Pennkinetic Er [Hydrocod Polst-Cpm Polst Er] Itching  . Ciprofloxacin Rash    VITALS:  Blood pressure 101/77, pulse (!) 110, temperature 98.1 F (36.7 C), temperature source Oral, resp. rate (!) 22, height 5\' 1"  (1.549 m), weight 54 kg (119 lb), SpO2 91 %.  PHYSICAL EXAMINATION:   Physical Exam  GENERAL:  72 y.o.-year-old patient lying in the bed with resp distress EYES: Pupils equal, round, reactive to light and accommodation. No scleral icterus. Extraocular muscles intact.  HEENT: Head atraumatic, normocephalic. Oropharynx and nasopharynx clear.  NECK:  Supple, no jugular venous distention. No thyroid enlargement,  no tenderness.  LUNGS: Increased Work of breathing.  Bilateral wheezing and coarse breath sounds. CARDIOVASCULAR: S1, S2 normal. No murmurs, rubs, or gallops.  ABDOMEN: Soft, nontender, nondistended. Bowel sounds present. No organomegaly or mass.  EXTREMITIES: No cyanosis, clubbing or edema b/l.    NEUROLOGIC: Cranial nerves II through XII are intact. No focal Motor or sensory deficits b/l.   PSYCHIATRIC: The patient is alert and awake.  Anxious. SKIN: No obvious rash, lesion, or ulcer.   LABORATORY PANEL:   CBC Recent Labs  Lab 01/04/18 0510  WBC 5.5  HGB 11.3*  HCT 33.5*  PLT 168   ------------------------------------------------------------------------------------------------------------------ Chemistries  Recent Labs  Lab 01/02/18 1158  01/06/18 0800  NA 139   < > 141  K 4.3   < > 3.4*  CL 104   < > 101  CO2 24   < > 32  GLUCOSE 113*   < > 89  BUN 45*   < > 52*  CREATININE 0.96   < > 1.24*  CALCIUM 9.1   < > 8.6*  MG  --    < > 2.3  AST 47*  --   --   ALT 50  --   --   ALKPHOS 67  --   --   BILITOT 0.9  --   --    < > = values in this interval not displayed.   ------------------------------------------------------------------------------------------------------------------  Cardiac Enzymes No results for input(s): TROPONINI in the last 168 hours. ------------------------------------------------------------------------------------------------------------------  RADIOLOGY:  No results found. ASSESSMENT AND PLAN:   * Acute on chronic hypoxic respiratory failure secondary to bronchitis over chronic pulmonary fibrosis and radiation pneumonitis Still has significant shortness of breath.  Steroids and bronchodilator  nebulizers. Appreciate pulmonary input. Initially on broad-spectrum antibiotics due to concern for pneumonia. Stopped antibiotics as per pulmonary recommendations  CHF was considered as a differential. Echocardiogram normal ejection fraction with no  significant valvular abnormalities.  * Anxiety.  Xanax as needed.  * Non-Hodgkin's lymphoma in remission  * Intermittent right upper quadrant pain.  Ultrasound showed no gallstones or issues with gallbladder.  Discussed with Dr. Rosana Hoes of surgery who saw the patient in the past..  Pain has resolved. This is likely due to pleurisy  * Mild hematuria due to foley trauma Resolved  DVT prophylaxis with Lovenox  All the records are reviewed and case discussed with Care Management/Social Worker Management plans discussed with the patient, family and they are in agreement.  CODE STATUS: FULL CODE  TOTAL TIME TAKING CARE OF THIS PATIENT: 35 minutes.   POSSIBLE D/C IN 1-2 DAYS, DEPENDING ON CLINICAL CONDITION.  Neita Carp M.D on 01/07/2018 at 11:20 AM  Between 7am to 6pm - Pager - [phone]  After 6pm go to [email or web address] - password [email or web address] [facility]  Office  [phone]  CC: Primary care physician; Baxter Hire, MD  Note: This dictation was prepared with Dragon dictation along with smaller phrase technology. Any transcriptional errors that result from this process are unintentional.

## 2018-01-07 NOTE — Progress Notes (Signed)
Daily Progress Note   Patient Name: Anna Mcbride       Date: 01/07/2018 DOB: [birth year]  Age: 72 y.o. MRN#: [id number] Attending Physician: Hillary Bow, MD Primary Care Physician: Baxter Hire, MD Admit Date: 01/02/2018  Reason for Consultation/Follow-up: Establishing goals of care  Subjective: Patient out of bed up in chair. Husband is at the bedside. Patient denies pain or discomfort. States she has been having some anxiety and recently received Xanax. She continues to be short of breath during conversation, requiring her to take frequent breaks when talking. Patient and husband verbalizes that they have decided to go home and receive home health services versus going to SNF for rehab. They continue to want outpatient palliative services at discharge. They state she would feel more better in her home than a facility and husband states he is there around the clock to assist.   Chart Reviewed.   Length of Stay: 5  Current Medications: Scheduled Meds:  . ALPRAZolam  0.5 mg Oral QHS  . aspirin EC  81 mg Oral Daily  . budesonide  0.5 mg Nebulization BID  . citalopram  30 mg Oral Daily  . enoxaparin (LOVENOX) injection  30 mg Subcutaneous Daily  . famotidine  20 mg Oral Daily  . feeding supplement (ENSURE ENLIVE)  237 mL Oral TID BM  . furosemide  20 mg Oral Daily  . ipratropium-albuterol  3 mL Nebulization Q6H  . levothyroxine  25 mcg Oral QAC breakfast  . multivitamin with minerals  1 tablet Oral Daily  . nystatin  5 mL Oral QID  . pantoprazole  40 mg Oral Daily  . polyethylene glycol  17 g Oral Daily  . potassium chloride SA  20 mEq Oral Daily  . [START ON 01/08/2018] predniSONE  40 mg Oral Q breakfast  . vitamin C  500 mg Oral Daily    Continuous Infusions:   PRN  Meds: acetaminophen, albuterol, ALPRAZolam, ondansetron (ZOFRAN) IV, ondansetron, traMADol  Physical Exam     Constitutional: She appears cachectic. She is cooperative. She appears ill.  Fragile in appearance   Cardiovascular: Normal rate, regular rhythm, normal heart sounds, intact distal pulses and normal pulses.  Pulmonary/Chest: She has rhonchi.  Shortness of breath, cough   Musculoskeletal:  Generalized weakness   Neurological: She is alert.  Skin:  Skin is warm and dry.  Psychiatric: Judgment normal. Her mood appears anxious. Cognition and memory are normal.  Nursing note and vitals reviewed.  Vital Signs: BP 101/77 (BP Location: Right Arm)   Pulse (!) 110   Temp 98.1 F (36.7 C) (Oral)   Resp (!) 22   Ht 5\' 1"  (1.549 m)   Wt 54 kg (119 lb)   LMP  (LMP Unknown)   SpO2 91%   BMI 22.48 kg/m  SpO2: SpO2: 91 % O2 Device: O2 Device: Nasal Cannula O2 Flow Rate: O2 Flow Rate (L/min): 3 L/min  Intake/output summary: No intake or output data in the 24 hours ending 01/07/18 1316 LBM: Last BM Date: 01/06/18 Baseline Weight: Weight: 52.9 kg (116 lb 10 oz) Most recent weight: Weight: 54 kg (119 lb)       Palliative Assessment/Data: PPS 40%   Patient Active Problem List   Diagnosis Date Noted  . Adjustment disorder with anxiety 01/06/2018  . Epigastric abdominal pain   . History of esophagitis 12/29/2017  . GERD (gastroesophageal reflux disease) 12/19/2017  . Biliary sludge 12/19/2017  . CKD (chronic kidney disease), stage III ([place]) 12/19/2017  . Chronic respiratory failure with hypoxia, on home O2 therapy ([place]) 12/16/2017  . Back pain with sciatica 05/07/2017  . Sepsis ([place]) 09/12/2016  . Pneumothorax   . Diffuse large B-cell lymphoma of lymph nodes of neck ([place]) 02/21/2016  . Recurrent upper respiratory infection (URI) 09/01/2015  . Acute respiratory failure with hypoxia ([place])   . 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 SOB (shortness of breath)   . Pneumonia   .  Acute respiratory failure ([place]) 11/29/2014  . History of digestive disease 11/29/2014  . HLD (hyperlipidemia) 11/29/2014  . Adult hypothyroidism 11/29/2014  . Lymphoma ([place]) 11/29/2014  . RAD (reactive airway disease) 11/29/2014  . Stenosis of subclavian artery ([place]) 11/29/2014  . Pain in shoulder 11/01/2014  . Fracture of humerus, proximal 11/01/2014  . Febrile neutropenia ([place]) 11/08/2013  . Decreased potassium in the blood 09/02/2013  . Acquired hypothyroidism 07/01/2013  . Lymphoma, non-Hodgkin's ([place]) 06/30/2013  . Cervical lymphadenopathy 04/20/2013  . History of lymphoma 04/20/2013  . Dyspnea 01/29/2011  . Cough 01/29/2011    Palliative Care Assessment & Plan   Patient Profile: 72 y.o. female admitted on 01/02/2018 with increased shortness of breath. She has a past medical history significant for chronic hypoxia, respiratory failure (2L home oxygen), Non-Hodgkins lymphoma s/p bone marrow transplant, chemotherapy, radiation therapy, anemia, and stenosis of subclavian artery. She is followed by Dr. Ardyth Man outpatient setting, she also sees Dr. Ashby Dawes and Dr. Miquel Dunn history of chronic pulmonary infiltrates r/t previous radiation treatments. Prior to admission she was seen by her PCP and placed on Z-Pak and prednisone tapering dose with no clinical improvement. Patient was seen in the office by PCP the day of admission and was found desaturating with increased shortness of breath. On assessment by admitting provider she was very anxious.  It was patient's 50th wedding anniversary and they had planned a big party for the following evening. Patient desperately wanted to get better by tomorrow evening to attend the function.Husband was at bedside.Patient denied any chest pain, she was on 3 L of oxygen via nasal cannula.CTAchest performed on 12/18/2017 revealed no pulmonary embolism but narrowing of the left subclavian and innominate arteries and stable pleural and  parenchymal changes related to the lymphoma and improved groundglass opacities of inflammatory origin. Palliative Medicine team consulted for goals of care discussion.  Recommendations/Plan:  FULL CODE-at patient/husband's request. They verbalized their awareness of her current respiratory state and function, however, they would still like to continue with aggressive measures even if it requires CPR/intubation. She verbalized she is ok with being on life-support for a limited amount of time and husband would be prepared to make decisions regarding removal of ventilation if necessary.   Continue to treat the treatable. Agree with Psych consult as patient is very anxious.   Case Manager aware that patient will need outpatient palliative services once discharged.   Palliative Medicine team will continue to support patient, family and medical team during hospitalization.   Goals of Care and Additional Recommendations:  Limitations on Scope of Treatment: Full Scope Treatment  Code Status:    Code Status Orders  (From admission, onward)        Start     Ordered   01/02/18 1204  Full code  Continuous     01/02/18 1206    Code Status History    Date Active Date Inactive Code Status Order ID Comments User Context   12/20/2017 0128 12/21/2017 1718 Full Code [id number]  Lance Coon, MD Inpatient   05/29/2017 1740 05/30/2017 1649 Full Code [id number]  Henreitta Leber, MD Inpatient   11/26/2016 1844 11/29/2016 1513 Full Code [id number]  Henreitta Leber, MD Inpatient   09/12/2016 0223 09/13/2016 1756 DNR [id number]  Harrie Foreman, MD ED   06/18/2016 1821 06/20/2016 1535 DNR [id number]  Vaughan Basta, MD Inpatient   11/29/2014 1405 12/05/2014 1550 DNR [id number]  Evlyn Kanner, NP Inpatient    Advance Directive Documentation     Most Recent Value  Type of Advance Directive  Healthcare Power of Attorney, Living will  Pre-existing out of facility DNR order (yellow form or pink MOST form)   -  "MOST" Form in Place?  -       Prognosis:   Unable to determine -guarded to poor in the setting of severe pulmonary radiation fibrosis, acute on chronic hypoxic respiratory failure, dyspnea on minimum exertion, oxygen dependent, anxiety, protein calorie malnutrition, decreased mobility, poor po intake.  Discharge Planning:  Home with [place] was discussed with patient, husband, and bedside RN.   Thank you for allowing the Palliative Medicine Team to assist in the care of this patient.   Total Time 25 min.  Prolonged Time Billed  NO       Greater than 50%  of this time was spent counseling and coordinating care related to the above assessment and plan.  Alda Lea, NP  Please contact Palliative Medicine Team phone at [phone] for questions and concerns.

## 2018-01-07 NOTE — Consult Note (Signed)
[facility] Consult   Reason for Consult: Follow-up consult 72 year old woman with respiratory distress and anxiety Referring Physician: [place] Patient Identification: Anna Mcbride MRN:  [id number] Principal Diagnosis: Adjustment disorder with anxiety Diagnosis:   Patient Active Problem List   Diagnosis Date Noted  . Adjustment disorder with anxiety [F43.22] 01/06/2018  . Epigastric abdominal pain [R10.13]   . History of esophagitis [Z87.19] 12/29/2017  . GERD (gastroesophageal reflux disease) [K21.9] 12/19/2017  . Biliary sludge [K83.8] 12/19/2017  . CKD (chronic kidney disease), stage III ([place]) [N18.3] 12/19/2017  . Chronic respiratory failure with hypoxia, on home O2 therapy (HCC) [J96.11, Z99.81] 12/16/2017  . Back pain with sciatica [M54.9, M54.30] 05/07/2017  . Sepsis ([place]) [A41.9] 09/12/2016  . Pneumothorax [J93.9]   . Diffuse large B-cell lymphoma of lymph nodes of neck ([place]) [C83.31] 02/21/2016  . Recurrent upper respiratory infection (URI) [J06.9] 09/01/2015  . Acute respiratory failure with hypoxia ([place]) [J96.01]   . 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 SOB (shortness of breath) [R06.02]   . Pneumonia [J18.9]   . Acute respiratory failure ([place]) [J96.00] 11/29/2014  . History of digestive disease [Z87.19] 11/29/2014  . HLD (hyperlipidemia) [E78.5] 11/29/2014  . Adult hypothyroidism [E03.9] 11/29/2014  . Lymphoma ([place]) [C85.90] 11/29/2014  . RAD (reactive airway disease) [J45.909] 11/29/2014  . Stenosis of subclavian artery ([place]) [I77.1] 11/29/2014  . Pain in shoulder [M25.519] 11/01/2014  . Fracture of humerus, proximal [S42.209A] 11/01/2014  . Febrile neutropenia ([place]) [D70.9, R50.81] 11/08/2013  . Decreased potassium in the blood [E87.6] 09/02/2013  . Acquired hypothyroidism [E03.9] 07/01/2013  . Lymphoma, non-Hodgkin's ([place]) [C85.90] 06/30/2013  . Cervical lymphadenopathy [R59.0] 04/20/2013  . History of lymphoma [Z85.79] 04/20/2013   . Dyspnea [R06.00] 01/29/2011  . Cough [R05] 01/29/2011    Total Time spent with patient: 20 minutes  Subjective:   Anna Mcbride is a 72 y.o. female patient admitted with "I think I am better".  HPI: Patient seen chart reviewed.  Patient subjectively feels like she is a little better today although she admits that she slept very poorly last night.  Still having trouble getting up and moving around at all.  Patient's mood however feels more optimistic.  Past Psychiatric History: History of chronic anxiety  Risk to Self:   Risk to Others:   Prior Inpatient Therapy:   Prior Outpatient Therapy:    Past Medical History:  Past Medical History:  Diagnosis Date  . Acquired hypothyroidism 07/01/2013  . Acute respiratory failure ([place]) 11/29/2014  . 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 Asthma   . Back pain with sciatica 05/07/2017  . Biliary sludge 12/19/2017  . Lymphoma, non-Hodgkin's ([place]) 06/30/2013  . Non Hodgkin's lymphoma ([place]) [id number]  . Personal history of chemotherapy   . Recurrent upper respiratory infection (URI) 09/01/2015  . Stenosis of subclavian artery ([place]) 11/29/2014    Past Surgical History:  Procedure Laterality Date  . 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 NASAL SINUS SURGERY  08/07/05 09/09/05   x 2   . VIDEO BRONCHOSCOPY Bilateral 11/30/2014   Procedure: VIDEO BRONCHOSCOPY WITHOUT FLUORO;  Surgeon: Flora Lipps, MD;  Location: [facility] ORS;  Service: Cardiopulmonary;  Laterality: Bilateral;   Family History:  Family History  Problem Relation Age of Onset  . Asthma Son   . Congestive Heart Failure Mother   . Diabetes Brother   . Pulmonary embolism Father   . Breast cancer Neg Hx    Family Psychiatric  History: None Social History:  Social History   Substance and Sexual Activity  Alcohol Use Not Currently  . Frequency: Never   Comment: a couple glasses of wine a year     Social History   Substance and Sexual Activity  Drug Use No    Social History    Socioeconomic History  . Marital status: Married    Spouse name: Not on file  . Number of children: 2  . Years of education: Not on file  . Highest education level: Not on file  Occupational History  . Occupation: Retired    Comment: Tour manager  . Financial resource strain: Not on file  . Food insecurity:    Worry: Not on file    Inability: Not on file  . Transportation needs:    Medical: Not on file    Non-medical: Not on file  Tobacco Use  . Smoking status: Never Smoker  . Smokeless tobacco: Never Used  Substance and Sexual Activity  . Alcohol use: Not Currently    Frequency: Never    Comment: a couple glasses of wine a year  . Drug use: No  . Sexual activity: Not on file  Lifestyle  . Physical activity:    Days per week: Not on file    Minutes per session: Not on file  . Stress: Not on file  Relationships  . Social connections:    Talks on phone: Not on file    Gets together: Not on file    Attends religious service: Not on file    Active member of club or organization: Not on file    Attends meetings of clubs or organizations: Not on file    Relationship status: Not on file  Other Topics Concern  . Not on file  Social History Narrative  . Not on file   Additional Social History:    Allergies:   Allergies  Allergen Reactions  . Hydrocodone-Homatropine Itching  . Tussionex Pennkinetic Er [Hydrocod Polst-Cpm Polst Er] Itching  . Ciprofloxacin Rash    Labs:  Results for orders placed or performed during the hospital encounter of 01/02/18 (from the past 48 hour(s))  Basic metabolic panel     Status: Abnormal   Collection Time: 01/06/18  8:00 AM  Result Value Ref Range   Sodium 141 135 - 145 mmol/L   Potassium 3.4 (L) 3.5 - 5.1 mmol/L   Chloride 101 101 - 111 mmol/L   CO2 32 22 - 32 mmol/L   Glucose, Bld 89 65 - 99 mg/dL   BUN 52 (H) 6 - 20 mg/dL   Creatinine, Ser 1.24 (H) 0.44 - 1.00 mg/dL   Calcium 8.6 (L) 8.9 - 10.3 mg/dL   GFR calc  non Af Amer 42 (L) >60 mL/min   GFR calc Af Amer 48 (L) >60 mL/min    Comment: (NOTE) The eGFR has been calculated using the CKD EPI equation. This calculation has not been validated in all clinical situations. eGFR's persistently <60 mL/min signify possible Chronic Kidney Disease.    Anion gap 8 5 - 15    Comment: Performed at [facility], [address] [place], [place] [address]  Magnesium     Status: None   Collection Time: 01/06/18  8:00 AM  Result Value Ref Range   Magnesium 2.3 1.7 - 2.4 mg/dL    Comment: Performed at [facility], [address], [place], [place] [address]    Current Facility-Administered Medications  Medication Dose Route Frequency Provider Last  Rate Last Dose  . acetaminophen (TYLENOL) tablet 650 mg  650 mg Oral Q6H PRN Sudini, Srikar, MD      . albuterol (PROVENTIL) (2.5 MG/3ML) 0.083% nebulizer solution 2.5 mg  2.5 mg Nebulization Q4H PRN Tukov-Yual, Magdalene S, NP      . ALPRAZolam (XANAX) tablet 0.25 mg  0.25 mg Oral Q6H PRN Merriam Brandner, Madie Reno, MD   0.25 mg at 01/07/18 0542  . ALPRAZolam Duanne Moron) tablet 0.5 mg  0.5 mg Oral QHS Donnamaria Shands T, MD   0.5 mg at 01/06/18 2148  . aspirin EC tablet 81 mg  81 mg Oral Daily Tukov-Yual, Magdalene S, NP   81 mg at 01/07/18 1029  . budesonide (PULMICORT) nebulizer solution 0.5 mg  0.5 mg Nebulization BID Tukov-Yual, Magdalene S, NP   0.5 mg at 01/07/18 0744  . citalopram (CELEXA) tablet 30 mg  30 mg Oral Daily Korrie Hofbauer T, MD   30 mg at 01/07/18 1029  . enoxaparin (LOVENOX) injection 30 mg  30 mg Subcutaneous Daily Tukov-Yual, Magdalene S, NP   30 mg at 01/07/18 1028  . famotidine (PEPCID) tablet 20 mg  20 mg Oral Daily Tukov-Yual, Magdalene S, NP   20 mg at 01/07/18 1029  . feeding supplement (ENSURE ENLIVE) (ENSURE ENLIVE) liquid 237 mL  237 mL Oral TID BM Tukov-Yual, Magdalene S, NP   237 mL at 01/06/18 1912  . furosemide (LASIX) tablet 20 mg  20 mg Oral Daily Tukov-Yual, Magdalene S, NP   20  mg at 01/07/18 1029  . ipratropium-albuterol (DUONEB) 0.5-2.5 (3) MG/3ML nebulizer solution 3 mL  3 mL Nebulization Q6H Tukov-Yual, Magdalene S, NP   3 mL at 01/07/18 1507  . levothyroxine (SYNTHROID, LEVOTHROID) tablet 25 mcg  25 mcg Oral QAC breakfast Tukov-Yual, Magdalene S, NP   25 mcg at 01/07/18 0806  . multivitamin with minerals tablet 1 tablet  1 tablet Oral Daily Tukov-Yual, Magdalene S, NP   1 tablet at 01/07/18 1029  . nystatin (MYCOSTATIN) 100000 UNIT/ML suspension 500,000 Units  5 mL Oral QID Tukov-Yual, Magdalene S, NP   500,000 Units at 01/07/18 1838  . ondansetron (ZOFRAN) injection 4 mg  4 mg Intravenous Q6H PRN Tukov-Yual, Magdalene S, NP   4 mg at 01/05/18 0832  . ondansetron (ZOFRAN) tablet 4 mg  4 mg Oral Q8H PRN Tukov-Yual, Magdalene S, NP   4 mg at 01/03/18 0701  . pantoprazole (PROTONIX) EC tablet 40 mg  40 mg Oral Daily Tukov-Yual, Magdalene S, NP   40 mg at 01/07/18 1029  . polyethylene glycol (MIRALAX / GLYCOLAX) packet 17 g  17 g Oral Daily Hillary Bow, MD   17 g at 01/06/18 4827  . potassium chloride SA (K-DUR,KLOR-CON) CR tablet 20 mEq  20 mEq Oral Daily Tukov-Yual, Magdalene S, NP   20 mEq at 01/07/18 1029  . [START ON 01/08/2018] predniSONE (DELTASONE) tablet 40 mg  40 mg Oral Q breakfast Sudini, Alveta Heimlich, MD      . traMADol Veatrice Bourbon) tablet 50 mg  50 mg Oral Q6H PRN Tukov-Yual, Magdalene S, NP      . vitamin C (ASCORBIC ACID) tablet 500 mg  500 mg Oral Daily Tukov-Yual, Magdalene S, NP   500 mg at 01/07/18 1032   Facility-Administered Medications Ordered in Other Encounters  Medication Dose Route Frequency Provider Last Rate Last Dose  . heparin lock flush 100 unit/mL  500 Units Intravenous Once Charlaine Dalton R, MD      . sodium chloride flush (NS)  0.9 % injection 10 mL  10 mL Intravenous Once Cammie Sickle, MD        Musculoskeletal: Strength & Muscle Tone: decreased Gait & Station: unsteady Patient leans: N/A  Psychiatric Specialty  Exam: Physical Exam  Nursing note and vitals reviewed. Constitutional: She appears well-developed and well-nourished.  HENT:  Head: Normocephalic and atraumatic.  Eyes: Pupils are equal, round, and reactive to light. Conjunctivae are normal.  Neck: Normal range of motion.  Cardiovascular: Regular rhythm and normal heart sounds.  Respiratory: She is in respiratory distress.  GI: Soft.  Musculoskeletal: Normal range of motion.  Neurological: She is alert.  Skin: Skin is warm and dry.  Psychiatric: Her speech is normal and behavior is normal. Judgment and thought content normal. Her mood appears anxious. Cognition and memory are normal.    Review of Systems  Constitutional: Negative.   HENT: Negative.   Eyes: Negative.   Respiratory: Positive for shortness of breath.   Cardiovascular: Negative.   Gastrointestinal: Negative.   Musculoskeletal: Negative.   Skin: Negative.   Neurological: Negative.   Psychiatric/Behavioral: The patient is nervous/anxious and has insomnia.     Blood pressure (!) 103/59, pulse (!) 108, temperature 98 F (36.7 C), temperature source Oral, resp. rate 18, height '5\' 1"'$  (1.549 m), weight 54 kg (119 lb), SpO2 97 %.Body mass index is 22.48 kg/m.  General Appearance: Fairly Groomed  Eye Contact:  Good  Speech:  Slow  Volume:  Decreased  Mood:  Euthymic  Affect:  Constricted  Thought Process:  Goal Directed  Orientation:  Full (Time, Place, and Person)  Thought Content:  Logical  Suicidal Thoughts:  No  Homicidal Thoughts:  No  Memory:  Immediate;   Fair Recent;   Fair Remote;   Fair  Judgement:  Fair  Insight:  Fair  Psychomotor Activity:  Decreased  Concentration:  Concentration: Fair  Recall:  [employer] of Knowledge:  Fair  Language:  Fair  Akathisia:  No  Handed:  Right  AIMS (if indicated):     Assets:  Desire for Improvement Financial Resources/Insurance Housing Intimacy Resilience Social Support  ADL's:  Intact  Cognition:  WNL   Sleep:        Treatment Plan Summary: Plan Patient seems to be feeling more optimistic even though she is still short of breath.  She is determined to be discharged soon and go back home.  Affect upbeat.  Energy level seems good.  No indication to make any changes to psychiatric medicine.  Reviewed plan with patient who is fully in agreement.  Supportive counseling.  I will follow-up just as needed.  Disposition: No evidence of imminent risk to self or others at present.   Patient does not meet criteria for psychiatric inpatient admission. Supportive therapy provided about ongoing stressors.  Alethia Berthold, MD 01/07/2018 7:33 PM

## 2018-01-07 NOTE — Care Management (Signed)
Discussed Home Health services at the bedside with Mr & Ms [place]. States that they have had [facility] in the past would like to have them again.  Would like wheelchair and rolling walker. Will need skilled nursing, physical therapy, and occupational therapy services. Outpatient Palliative Shelbie Ammons RN MSN CCM Care Management [phone]

## 2018-01-08 DIAGNOSIS — E440 Moderate protein-calorie malnutrition: Secondary | ICD-10-CM

## 2018-01-08 MED ORDER — PREDNISONE 20 MG PO TABS: 40.0000 mg | ORAL_TABLET | Freq: Every day | ORAL | 0 refills | Status: AC

## 2018-01-08 MED ORDER — ADULT MULTIVITAMIN W/MINERALS CH: 1.0000 | ORAL_TABLET | Freq: Every day | ORAL | Status: AC

## 2018-01-08 MED ORDER — NYSTATIN 100000 UNIT/ML MT SUSP: 5.0000 mL | Freq: Four times a day (QID) | OROMUCOSAL | 0 refills | Status: AC

## 2018-01-08 MED ORDER — POTASSIUM CHLORIDE CRYS ER 20 MEQ PO TBCR: 40.0000 meq | EXTENDED_RELEASE_TABLET | Freq: Once | ORAL | Status: DC

## 2018-01-08 MED ORDER — ACETAMINOPHEN 325 MG PO TABS: 650.0000 mg | ORAL_TABLET | Freq: Four times a day (QID) | ORAL | Status: AC | PRN

## 2018-01-08 MED ORDER — TRAMADOL HCL 50 MG PO TABS: 50.0000 mg | ORAL_TABLET | Freq: Four times a day (QID) | ORAL | 0 refills | Status: DC | PRN

## 2018-01-08 MED ORDER — ALPRAZOLAM 0.5 MG PO TABS
0.5000 mg | ORAL_TABLET | Freq: Every day | ORAL | 0 refills | Status: AC
Start: 2018-01-08 — End: ?

## 2018-01-08 MED ORDER — ONDANSETRON 4 MG PO TBDP: 4.0000 mg | ORAL_TABLET | Freq: Three times a day (TID) | ORAL | 0 refills | Status: DC | PRN

## 2018-01-08 MED ORDER — ENSURE ENLIVE PO LIQD: 237.0000 mL | Freq: Three times a day (TID) | ORAL | 0 refills | Status: AC

## 2018-01-08 MED ORDER — ALBUTEROL SULFATE (2.5 MG/3ML) 0.083% IN NEBU: 2.5000 mg | INHALATION_SOLUTION | RESPIRATORY_TRACT | 0 refills | Status: DC | PRN

## 2018-01-08 MED ORDER — IPRATROPIUM-ALBUTEROL 0.5-2.5 (3) MG/3ML IN SOLN: 3.0000 mL | Freq: Four times a day (QID) | RESPIRATORY_TRACT | 1 refills | Status: AC

## 2018-01-08 MED ORDER — POLYETHYLENE GLYCOL 3350 17 G PO PACK: 17.0000 g | PACK | Freq: Every day | ORAL | 0 refills | Status: DC

## 2018-01-08 NOTE — Care Management (Signed)
Discharge to home today per Dr. Margaretmary Eddy. Will be followed per [facility] for [facility] services. [facility] will be providing wheelchair and rolling walker. Husband will transport Shelbie Ammons RN MSN [facility] Management [phone]

## 2018-01-08 NOTE — Progress Notes (Signed)
OT Cancellation Note  Patient Details Name: Anna Mcbride MRN: [id number] DOB: 1945-09-23   Cancelled Treatment:    Reason Eval/Treat Not Completed: Patient declined, no reason specified. Pt/spouse politely decline additional OT this date. Eager to return home today. Provided pt with ginger ale and graham crackers, per spouse's request. Will re-attempt next date if pt is still in the hospital.   Jeni Salles, MPH, MS, OTR/L ascom [phone] 01/08/18, 11:50 AM

## 2018-01-08 NOTE — Discharge Summary (Signed)
[facility] [place] NAME: Anna Mcbride    MR#:  [id number]  DATE OF BIRTH:  [birth year]  DATE OF ADMISSION:  01/02/2018 ADMITTING PHYSICIAN: Loletha Grayer, MD  DATE OF DISCHARGE: 01/08/18 PRIMARY CARE PHYSICIAN: Baxter Hire, MD    ADMISSION DIAGNOSIS:  Hypoxia hypotension  DISCHARGE DIAGNOSIS:  Principal Problem:   Adjustment disorder with anxiety Active Problems:   Acute respiratory failure with hypoxia (HCC)   Malnutrition of moderate degree   SECONDARY DIAGNOSIS:   Past Medical History:  Diagnosis Date  . Acquired hypothyroidism 07/01/2013  . Acute respiratory failure ([place]) 11/29/2014  . 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 Asthma   . Back pain with sciatica 05/07/2017  . Biliary sludge 12/19/2017  . Lymphoma, non-Hodgkin's ([place]) 06/30/2013  . Non Hodgkin's lymphoma ([place]) [id number]  . Personal history of chemotherapy   . Recurrent upper respiratory infection (URI) 09/01/2015  . 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 female with a known history of chronic hypoxic respiratory failure living on 2 L of oxygen, non-Hodgkin's lymphoma with recurrence seeing Dr. Rogue Bussing as an outpatient, on chemotherapy, seeing Dr. Ashby Dawes and Dr. Mortimer Fries for history of chronic pulmonary infiltrates from the previous radiation treatment was seen by primary care physician and patient was placed on Z-Pak and prednisone tapering dose with no clinical improvement.  Patient was desaturating on today's primary care physician follow-up with increased shortness of breath and patient is sent over to the hospital as a direct admit.  During my examination patient is not short of breath and very anxious.  Today's patient's 66th wedding anniversary and they have planned a big  party tomorrow evening and patient desperately wanted to get better by tomorrow evening to attend the function.   Husband at bedside.  Patient denies any chest pain, currently on 3 L of oxygen via nasal cannula.  Also reporting right upper quadrant abdominal pain recent CT abdomen on 12/19/2017 has revealed biliary sludge.  CTA chest performed on 12/18/2017 has revealed no pulmonary embolism but narrowing of the left subclavian and innominate arteries and stable pleural and parenchymal changes related to the lymphoma and improved groundglass opacities of inflammatory origin   * Acute on chronic hypoxic respiratory failure secondary to bronchitis over chronic pulmonary fibrosis and radiation pneumonitis Still has significant shortness of breath, which is her new baseline Steroids and bronchodilator nebulizers. Appreciate pulmonary input. Initially on broad-spectrum antibiotics due to concern for pneumonia. Stopped antibiotics as per pulmonary recommendations  CHF was considered as a differential. Echocardiogram normal ejection fraction with no significant valvular abnormalities.  * Anxiety.  Xanax as needed.  * Non-Hodgkin's lymphoma in remission  * Intermittent right upper quadrant pain.  Ultrasound showed no gallstones or issues with gallbladder.  Discussed with Dr. Rosana Hoes of surgery who saw the patient in the past..  Pain has resolved. This is likely due to pleurisy  * Mild hematuria due to foley trauma Resolved  DVT prophylaxis with Lovenox    DISCHARGE CONDITIONS:   Guarded   CONSULTS OBTAINED:  Treatment Team:  Lloyd Huger, MD Allyne Gee, MD Teodoro Spray, MD Clapacs, Madie Reno, MD Corey Skains, MD   PROCEDURES   DRUG ALLERGIES:   Allergies  Allergen Reactions  . Hydrocodone-Homatropine Itching  . Tussionex Pennkinetic Er [Hydrocod Polst-Cpm Polst Er] Itching  . Ciprofloxacin Rash    DISCHARGE  MEDICATIONS:   Allergies as of 01/08/2018      Reactions   Hydrocodone-homatropine Itching   Tussionex Pennkinetic Er [hydrocod Polst-cpm Polst Er] Itching    Ciprofloxacin Rash      Medication List    STOP taking these medications   AMBULATORY NON FORMULARY MEDICATION   azithromycin 250 MG tablet Commonly known as:  ZITHROMAX   formoterol 20 MCG/2ML nebulizer solution Commonly known as:  PERFOROMIST   predniSONE 10 MG (21) Tbpk tablet Commonly known as:  STERAPRED UNI-PAK 21 TAB Replaced by:  predniSONE 20 MG tablet     TAKE these medications   acetaminophen 325 MG tablet Commonly known as:  TYLENOL Take 2 tablets (650 mg total) by mouth every 6 (six) hours as needed for mild pain (headache).   albuterol 108 (90 Base) MCG/ACT inhaler Commonly known as:  PROVENTIL HFA;VENTOLIN HFA Inhale 1 puff into the lungs every 6 (six) hours as needed for wheezing or shortness of breath.   albuterol (2.5 MG/3ML) 0.083% nebulizer solution Commonly known as:  PROVENTIL Take 3 mLs (2.5 mg total) by nebulization every 4 (four) hours as needed for wheezing or shortness of breath.   ALPRAZolam 0.5 MG tablet Commonly known as:  XANAX Take 1 tablet (0.5 mg total) by mouth at bedtime. What changed:    when to take this  reasons to take this  additional instructions   aspirin EC 81 MG tablet Take 81 mg by mouth daily.   budesonide 0.5 MG/2ML nebulizer solution Commonly known as:  PULMICORT Take 2 mLs (0.5 mg total) by nebulization 2 (two) times daily.   citalopram 20 MG tablet Commonly known as:  CELEXA TAKE 1 TABLET BY MOUTH ONCE DAILY   feeding supplement (ENSURE ENLIVE) Liqd Take 237 mLs by mouth 3 (three) times daily between meals.   FLUTTER Devi Use 10-15 times daily   ipratropium-albuterol 0.5-2.5 (3) MG/3ML Soln Commonly known as:  DUONEB Take 3 mLs by nebulization every 6 (six) hours.   levothyroxine 25 MCG tablet Commonly known as:  SYNTHROID, LEVOTHROID TAKE 1 TABLET BY MOUTH ONCE DAILY ON AN EMPTY STOMACH. WAIT 30 MINUTES BEFORE TAKING OTHER MEDS.   multivitamin with minerals Tabs tablet Take 1 tablet by mouth  daily. Start taking on:  01/09/2018   nystatin 100000 UNIT/ML suspension Commonly known as:  MYCOSTATIN Take 5 mLs (500,000 Units total) by mouth 4 (four) times daily for 5 days.   omeprazole 20 MG capsule Commonly known as:  PRILOSEC TAKE 1 CAPSULE BY MOUTH TWICE DAILY BEFORE MEALS   ondansetron 4 MG disintegrating tablet Commonly known as:  ZOFRAN ODT Take 1 tablet (4 mg total) by mouth every 8 (eight) hours as needed for nausea or vomiting.   polyethylene glycol packet Commonly known as:  MIRALAX / GLYCOLAX Take 17 g by mouth daily. Start taking on:  01/09/2018   potassium chloride SA 20 MEQ tablet Commonly known as:  K-DUR,KLOR-CON Take 1 tablet (20 mEq total) by mouth daily.   predniSONE 20 MG tablet Commonly known as:  DELTASONE Take 2 tablets (40 mg total) by mouth daily with breakfast for 10 days. Start taking on:  01/09/2018 Replaces:  predniSONE 10 MG (21) Tbpk tablet   traMADol 50 MG tablet Commonly known as:  ULTRAM Take 1 tablet (50 mg total) by mouth every 6 (six) hours as needed for moderate pain.   vitamin C 500 MG tablet Commonly known as:  ASCORBIC ACID Take 500 mg by mouth daily.  Durable Medical Equipment  (From admission, onward)        Start     Ordered   01/08/18 1305  For home use only DME standard manual wheelchair with seat cushion  Once    Comments:  Patient suffers from  which impairs their ability to perform daily activities like  in the home.  A will not resolve  issue with performing activities of daily living. A wheelchair will allow patient to safely perform daily activities. Patient can safely propel the wheelchair in the home or has a caregiver who can provide assistance.  Accessories: elevating leg rests (ELRs), wheel locks, extensions and anti-tippers.   01/08/18 1308   01/08/18 1222  For home use only DME wheelchair cushion (seat and back)  Once     01/08/18 1221   01/08/18 1016  For home use only DME Walker rolling   Once    Comments:  RW with 5 inch wheels  Question:  Patient needs a walker to treat with the following condition  Answer:  'light-for-dates' infant with signs of fetal malnutrition   01/08/18 1015       DISCHARGE INSTRUCTIONS:    op palliative care Continue 3 lit of home o2 See PCP in 3-4 days Op pulm - dr.Ramachandran- 5 days  DIET:  Cardiac diet  DISCHARGE CONDITION:  Fair  ACTIVITY:  Activity as tolerated  OXYGEN:  Home Oxygen: Yes.     Oxygen Delivery: 3 liters/min via Patient connected to nasal cannula oxygen  DISCHARGE LOCATION:  home   If you experience worsening of your admission symptoms, develop shortness of breath, life threatening emergency, suicidal or homicidal thoughts you must seek medical attention immediately by calling 911 or calling your MD immediately  if symptoms less severe.  You Must read complete instructions/literature along with all the possible adverse reactions/side effects for all the Medicines you take and that have been prescribed to you. Take any new Medicines after you have completely understood and accpet all the possible adverse reactions/side effects.   Please note  You were cared for by a hospitalist during your hospital stay. If you have any questions about your discharge medications or the care you received while you were in the hospital after you are discharged, you can call the unit and asked to speak with the hospitalist on call if the hospitalist that took care of you is not available. Once you are discharged, your primary care physician will handle any further medical issues. Please note that NO REFILLS for any discharge medications will be authorized once you are discharged, as it is imperative that you return to your primary care physician (or establish a relationship with a primary care physician if you do not have one) for your aftercare needs so that they can reassess your need for medications and monitor your lab  values.     Today  No chief complaint on file.  Pt is sob at rest, which is her new base line from underlying pulm fibrosis  ROS:  CONSTITUTIONAL: Denies fevers, chills. Denies any fatigue, weakness.  EYES: Denies blurry vision, double vision, eye pain. EARS, NOSE, THROAT: Denies tinnitus, ear pain, hearing loss. RESPIRATORY: Denies cough, wheeze, reports shortness of breath with min exertion, her new baseline  CARDIOVASCULAR: Denies chest pain, palpitations, edema.  GASTROINTESTINAL: Denies nausea, vomiting, diarrhea, abdominal pain. Denies bright red blood per rectum. GENITOURINARY: Denies dysuria, hematuria. ENDOCRINE: Denies nocturia or thyroid problems. HEMATOLOGIC AND LYMPHATIC: Denies easy bruising or bleeding. SKIN: Denies rash  or lesion. MUSCULOSKELETAL: Denies pain in neck, back, shoulder, knees, hips or arthritic symptoms.  NEUROLOGIC: Denies paralysis, paresthesias.  PSYCHIATRIC: Denies anxiety or depressive symptoms.   VITAL SIGNS:  Blood pressure 107/83, pulse (!) 102, temperature 97.6 F (36.4 C), temperature source Oral, resp. rate 18, height 5\' 1"  (1.549 m), weight 54 kg (119 lb), SpO2 98 %.  I/O:  No intake or output data in the 24 hours ending 01/08/18 1359  PHYSICAL EXAMINATION:  GENERAL:  72 y.o.-year-old patient lying in the bed with no acute distress.  EYES: Pupils equal, round, reactive to light and accommodation. No scleral icterus. Extraocular muscles intact.  HEENT: Head atraumatic, normocephalic. Oropharynx and nasopharynx clear.  NECK:  Supple, no jugular venous distention. No thyroid enlargement, no tenderness.  LUNGS: diminished breath sounds bilaterally, no wheezing, rales,rhonchi , chronic crepitation. No use of accessory muscles of respiration.  CARDIOVASCULAR: S1, S2 normal. No murmurs, rubs, or gallops.  ABDOMEN: Soft, non-tender, non-distended. Bowel sounds present. No organomegaly or mass.  EXTREMITIES: No pedal edema, cyanosis, or  clubbing.  NEUROLOGIC: Cranial nerves II through XII are intact. Muscle strength 5/5 in all extremities. Sensation intact. Gait not checked.  PSYCHIATRIC: The patient is alert and oriented x 3.  SKIN: No obvious rash, lesion, or ulcer.   DATA REVIEW:   CBC Recent Labs  Lab 01/04/18 0510  WBC 5.5  HGB 11.3*  HCT 33.5*  PLT 168    Chemistries  Recent Labs  Lab 01/02/18 1158  01/06/18 0800  NA 139   < > 141  K 4.3   < > 3.4*  CL 104   < > 101  CO2 24   < > 32  GLUCOSE 113*   < > 89  BUN 45*   < > 52*  CREATININE 0.96   < > 1.24*  CALCIUM 9.1   < > 8.6*  MG  --    < > 2.3  AST 47*  --   --   ALT 50  --   --   ALKPHOS 67  --   --   BILITOT 0.9  --   --    < > = values in this interval not displayed.    Cardiac Enzymes No results for input(s): TROPONINI in the last 168 hours.  Microbiology Results  Results for orders placed or performed during the hospital encounter of 01/02/18  MRSA PCR Screening     Status: None   Collection Time: 01/03/18  4:55 PM  Result Value Ref Range Status   MRSA by PCR NEGATIVE NEGATIVE Final    Comment:        The GeneXpert MRSA Assay (FDA approved for NASAL specimens only), is one component of a comprehensive MRSA colonization surveillance program. It is not intended to diagnose MRSA infection nor to guide or monitor treatment for MRSA infections. Performed at [facility], [address] [place], [place] [address]     RADIOLOGY:  No results found.  EKG:   Orders placed or performed during the hospital encounter of 12/19/17  . ED EKG within 10 minutes  . ED EKG within 10 minutes      Management plans discussed with the patient, family and they are in agreement.  CODE STATUS:     Code Status Orders  (From admission, onward)        Start     Ordered   01/02/18 1204  Full code  Continuous     01/02/18 1206  Code Status History    Date Active Date Inactive Code Status Order ID Comments User Context    12/20/2017 0128 12/21/2017 1718 Full Code [id number]  Lance Coon, MD Inpatient   05/29/2017 1740 05/30/2017 1649 Full Code [id number]  Henreitta Leber, MD Inpatient   11/26/2016 1844 11/29/2016 1513 Full Code [id number]  Henreitta Leber, MD Inpatient   09/12/2016 0223 09/13/2016 1756 DNR [id number]  Harrie Foreman, MD ED   06/18/2016 1821 06/20/2016 1535 DNR [id number]  Vaughan Basta, MD Inpatient   11/29/2014 1405 12/05/2014 1550 DNR [id number]  Evlyn Kanner, NP Inpatient    Advance Directive Documentation     Most Recent Value  Type of Advance Directive  Healthcare Power of Attorney, Living will  Pre-existing out of facility DNR order (yellow form or pink MOST form)  -  "MOST" Form in Place?  -      TOTAL TIME TAKING CARE OF THIS PATIENT: 67minutes.   Note: This dictation was prepared with Dragon dictation along with smaller phrase technology. Any transcriptional errors that result from this process are unintentional.   @[facility] @  on 01/08/2018 at 1:59 PM  Between 7am to 6pm - Pager - [phone]  After 6pm go to [email or web address] - password [email or web address] [facility]  Office  [phone]  CC: Primary care physician; Baxter Hire, MD

## 2018-01-08 NOTE — Progress Notes (Signed)
Daily Progress Note   Patient Name: Anna Mcbride       Date: 01/08/2018 DOB: 06-05-1946  Age: 72 y.o. MRN#: [id number] Attending Physician: [name], MD Primary Care Physician: Baxter Hire, MD Admit Date: 01/02/2018  Reason for Consultation/Follow-up: Establishing goals of care  Subjective: Patient out of bed up in chair. Husband is at the bedside. Patient denies pain or discomfort. States she is excited about being able to go home. Husband verbalized arrangements and delivery for all home equipment. They verbalized plans to discharge home with outpatient palliative services.   Chart Reviewed and report received by bedside RN.   Length of Stay: 6  Current Medications: Scheduled Meds:  . ALPRAZolam  0.5 mg Oral QHS  . aspirin EC  81 mg Oral Daily  . budesonide  0.5 mg Nebulization BID  . citalopram  30 mg Oral Daily  . enoxaparin (LOVENOX) injection  30 mg Subcutaneous Daily  . famotidine  20 mg Oral Daily  . feeding supplement (ENSURE ENLIVE)  237 mL Oral TID BM  . furosemide  20 mg Oral Daily  . ipratropium-albuterol  3 mL Nebulization Q6H  . levothyroxine  25 mcg Oral QAC breakfast  . multivitamin with minerals  1 tablet Oral Daily  . nystatin  5 mL Oral QID  . pantoprazole  40 mg Oral Daily  . polyethylene glycol  17 g Oral Daily  . potassium chloride SA  20 mEq Oral Daily  . potassium chloride  40 mEq Oral Once  . predniSONE  40 mg Oral Q breakfast  . vitamin C  500 mg Oral Daily    Continuous Infusions:   PRN Meds: acetaminophen, albuterol, ALPRAZolam, ondansetron (ZOFRAN) IV, ondansetron, traMADol  Physical Exam     Constitutional: She appears cachectic. She is cooperative.  Fragile in appearance   Cardiovascular: Normal rate, regular rhythm, normal heart  sounds, intact distal pulses and normal pulses.  Pulmonary/Chest: She has rhonchi.  Shortness of breath at baseline  Musculoskeletal:  Generalized weakness   Neurological: She is alert.  Skin: Skin is warm and dry.  Psychiatric: Judgment normal.  Cognition and memory are normal.  Nursing note and vitals reviewed.  Vital Signs: BP 107/83 (BP Location: Right Arm)   Pulse (!) 102   Temp 97.6 F (36.4 C) (Oral)  Resp 18   Ht 5\' 1"  (1.549 m)   Wt 54 kg (119 lb)   LMP  (LMP Unknown)   SpO2 98%   BMI 22.48 kg/m  SpO2: SpO2: 98 % O2 Device: O2 Device: Nasal Cannula O2 Flow Rate: O2 Flow Rate (L/min): 2 L/min  Intake/output summary: No intake or output data in the 24 hours ending 01/08/18 1513 LBM: Last BM Date: 01/07/18 Baseline Weight: Weight: 52.9 kg (116 lb 10 oz) Most recent weight: Weight: 54 kg (119 lb)       Palliative Assessment/Data: PPS 40%   Patient Active Problem List   Diagnosis Date Noted  . Malnutrition of moderate degree 01/08/2018  . Adjustment disorder with anxiety 01/06/2018  . Epigastric abdominal pain   . History of esophagitis 12/29/2017  . GERD (gastroesophageal reflux disease) 12/19/2017  . Biliary sludge 12/19/2017  . CKD (chronic kidney disease), stage III ([place]) 12/19/2017  . Chronic respiratory failure with hypoxia, on home O2 therapy ([place]) 12/16/2017  . Back pain with sciatica 05/07/2017  . Sepsis ([place]) 09/12/2016  . Pneumothorax   . 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 Recurrent upper respiratory infection (URI) 09/01/2015  . Acute respiratory failure with hypoxia ([employer])   . 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 SOB (shortness of breath)   . Pneumonia   . Acute respiratory failure ([place]) 11/29/2014  . History of digestive disease 11/29/2014  . HLD (hyperlipidemia) 11/29/2014  . Adult hypothyroidism 11/29/2014  . Lymphoma ([place]) 11/29/2014  . RAD (reactive airway disease) 11/29/2014  . Stenosis of  subclavian artery ([place]) 11/29/2014  . Pain in shoulder 11/01/2014  . Fracture of humerus, proximal 11/01/2014  . Febrile neutropenia ([place]) 11/08/2013  . Decreased potassium in the blood 09/02/2013  . Acquired hypothyroidism 07/01/2013  . Lymphoma, non-Hodgkin's ([place]) 06/30/2013  . Cervical lymphadenopathy 04/20/2013  . History of lymphoma 04/20/2013  . Dyspnea 01/29/2011  . Cough 01/29/2011    Palliative Care Assessment & Plan   Patient Profile: 72 y.o. female admitted on 01/02/2018 with increased shortness of breath. She has a past medical history significant for chronic hypoxia, respiratory failure (2L home oxygen), Non-Hodgkins lymphoma s/p bone marrow transplant, chemotherapy, radiation therapy, anemia, and stenosis of subclavian artery. She is followed by Dr. Ardyth Man outpatient setting, she also sees Dr. Ashby Dawes and Dr. Miquel Dunn history of chronic pulmonary infiltrates r/t previous radiation treatments. Prior to admission she was seen by her PCP and placed on Z-Pak and prednisone tapering dose with no clinical improvement. Patient was seen in the office by PCP the day of admission and was found desaturating with increased shortness of breath. On assessment by admitting provider she was very anxious.  It was patient's 50th wedding anniversary and they had planned a big party for the following evening. Patient desperately wanted to get better by tomorrow evening to attend the function.Husband was at bedside.Patient denied any chest pain, she was on 3 L of oxygen via nasal cannula.CTAchest performed on 12/18/2017 revealed no pulmonary embolism but narrowing of the left subclavian and innominate arteries and stable pleural and parenchymal changes related to the lymphoma and improved groundglass opacities of inflammatory origin. Palliative Medicine team consulted for goals of care discussion.   Recommendations/Plan:  FULL CODE-at patient/husband's request. They verbalized their  awareness of her current respiratory state and function, however, they would still like to continue with aggressive measures even if it requires CPR/intubation. She verbalized she is ok with being on life-support for  a limited amount of time and husband would be prepared to make decisions regarding removal of ventilation if necessary.   Case Manager aware that patient will need outpatient palliative services once discharged.   Palliative Medicine team will continue to support patient, family and medical team during hospitalization.   Goals of Care and Additional Recommendations:  Limitations on Scope of Treatment: Full Scope Treatment  Code Status:    Code Status Orders  (From admission, onward)        Start     Ordered   01/02/18 1204  Full code  Continuous     01/02/18 1206    Code Status History    Date Active Date Inactive Code Status Order ID Comments User Context   12/20/2017 0128 12/21/2017 1718 Full Code [id number]  Lance Coon, MD Inpatient   05/29/2017 1740 05/30/2017 1649 Full Code [id number]  Henreitta Leber, MD Inpatient   11/26/2016 1844 11/29/2016 1513 Full Code [id number]  Henreitta Leber, MD Inpatient   09/12/2016 0223 09/13/2016 1756 DNR [id number]  Harrie Foreman, MD ED   06/18/2016 1821 06/20/2016 1535 DNR [id number]  Vaughan Basta, MD Inpatient   11/29/2014 1405 12/05/2014 1550 DNR [id number]  Evlyn Kanner, NP Inpatient    Advance Directive Documentation     Most Recent Value  Type of Advance Directive  Healthcare Power of Attorney, Living will  Pre-existing out of facility DNR order (yellow form or pink MOST form)  -  "MOST" Form in Place?  -       Prognosis:   Unable to determine -guarded to poor in the setting of severe pulmonary radiation fibrosis, acute on chronic hypoxic respiratory failure, dyspnea on minimum exertion, oxygen dependent, anxiety, protein calorie malnutrition, decreased mobility, poor po intake.  Discharge Planning:  Home  with [place] was discussed with patient, husband, and bedside RN.   Thank you for allowing the Palliative Medicine Team to assist in the care of this patient.   Total Time 25 min.  Prolonged Time Billed  NO       Greater than 50%  of this time was spent counseling and coordinating care related to the above assessment and plan.  Alda Lea, NP  Please contact Palliative Medicine Team phone at [phone] for questions and concerns.

## 2018-01-08 NOTE — Progress Notes (Signed)
Physical Therapy Treatment Patient Details Name: Anna Mcbride MRN: [id number] DOB: 05-Apr-1946 Today's Date: 01/08/2018    History of Present Illness 72yo female pt presented to ER secondary to worsening SOB, desaturation; admitted for management of bilat PNA, chronic pulmonary fibrosis, radiation pneumonitis and symptomatic cholelithiasis (pending surgery consult).    PT Comments    Pt resting in bed.  Wanting to get up and walk.  O2 at rest on 2 lpm 90% P 108.  Pt transitioned to edge of bed with supervision.  She was able to stand with min guard/assist and ambulate very slowly with short shuffling steps about 5' with frequent self initiated rest breaks.  Limited due to general fatigue and lethargy which she attributes to medication for anxiety.  After gait she inially wanted to remain up in recliner but chose in the end to return to bed.  Encouraged out of bed later this am when rested.  O2 91%, HR 95 after return to bed when taken on right hand.  Left reads very varied.  Discussed D/C plan with pt.  While SNF would be appropriate she is declining stating she wants to return home.  Husband has purchased a bedside commode and a wedge pillow.  While pt stated she was on O2 at home, she does not have a walker or wheelchair. Given pt's very limited ability for gait at this time, if she chooses to return home, a wheelchair would be necessary for household mobility as she is limited to transfers.  She voiced understanding.  O2 and HR monitored though out session.  R hand seems much more steady and accurate than L hand for readings.    Pt suffers from respiratory illnesses which impairs her ability to perform ADL's and mobility in the home.  A walker will not resolve issues with ADL's and mobility.  A wheelchair will allow pt to safely perform daily household mobility.  Pt can safely propel chair or husband/caregiver can assist her.    Follow Up Recommendations  SNF, other.  HHPT if she continues to  decline rehab.     Equipment Recommendations  Rolling walker with 5" wheels;Wheelchair (measurements PT)    Recommendations for Other Services       Precautions / Restrictions Precautions Precaution Comments: monitor HR and O2 sats  - R hand more accurate than L  Restrictions Weight Bearing Restrictions: No    Mobility  Bed Mobility Overal bed mobility: Needs Assistance Bed Mobility: Supine to Sit;Sit to Supine     Supine to sit: Supervision Sit to supine: Supervision      Transfers Overall transfer level: Needs assistance Equipment used: Rolling walker (2 wheeled) Transfers: Sit to/from Stand Sit to Stand: Min guard;Min assist            Ambulation/Gait Ambulation/Gait assistance: Min guard;Min assist Gait Distance (Feet): 5 Feet Assistive device: Rolling walker (2 wheeled) Gait Pattern/deviations: Step-through pattern;Decreased step length - right;Decreased step length - left;Narrow base of support;Shuffle   Gait velocity interpretation: <1.31 ft/sec, indicative of household Metallurgist Rankin (Stroke Patients Only)       Balance Overall balance assessment: Needs assistance Sitting-balance support: No upper extremity supported;Feet supported Sitting balance-Leahy Scale: Good     Standing balance support: Bilateral upper extremity supported Standing balance-Leahy Scale: Fair  Cognition Arousal/Alertness: Awake/alert Behavior During Therapy: WFL for tasks assessed/performed Overall Cognitive Status: Within Functional Limits for tasks assessed                                        Exercises      General Comments        Pertinent Vitals/Pain Pain Assessment: No/denies pain    Home Living                      Prior Function            PT Goals (current goals can now be found in the care plan section)  Progress towards PT goals: Progressing toward goals    Frequency    Min 2X/week      PT Plan Current plan remains appropriate    Co-evaluation              AM-PAC PT "6 Clicks" Daily Activity  Outcome Measure  Difficulty turning over in bed (including adjusting bedclothes, sheets and blankets)?: None Difficulty moving from lying on back to sitting on the side of the bed? : None Difficulty sitting down on and standing up from a chair with arms (e.g., wheelchair, bedside commode, etc,.)?: A Little Help needed moving to and from a bed to chair (including a wheelchair)?: A Little Help needed walking in hospital room?: A Little Help needed climbing 3-5 steps with a railing? : A Lot 6 Click Score: 19    End of Session Equipment Utilized During Treatment: Oxygen;Gait belt Activity Tolerance: Patient limited by fatigue;Patient tolerated treatment well Patient left: in bed;with bed alarm set;with call bell/phone within reach         Time: 0828-0852 PT Time Calculation (min) (ACUTE ONLY): 24 min  Charges:  $Gait Training: 23-37 mins                    G Codes:       Chesley Noon, PTA 01/08/18, 9:06 AM

## 2018-01-08 NOTE — Discharge Instructions (Signed)
op palliative care Continue 3 lit of home o2 See PCP in 3-4 days Op pulm - dr.Ramachandran- 5 days

## 2018-01-08 NOTE — Progress Notes (Signed)
Discharge instructions along with home medications and follow up gone over with patient and husbnad. Both verbalize that they understood instructions. All prescriptions given to patient. IV removed. Pt being discharged home on 3L 02 no distress noted. Ammie Dalton, RN

## 2018-01-09 ENCOUNTER — Encounter: Attending: Internal Medicine | Primary: Internal Medicine

## 2018-01-09 ENCOUNTER — Ambulatory Visit: Attending: Internal Medicine | Primary: Internal Medicine

## 2018-01-09 DIAGNOSIS — F419 Anxiety disorder, unspecified: Secondary | ICD-10-CM

## 2018-01-09 DIAGNOSIS — Z6841 Body Mass Index (BMI) 40.0 and over, adult: Principal | ICD-10-CM

## 2018-01-09 DIAGNOSIS — M1990 Unspecified osteoarthritis, unspecified site: Secondary | ICD-10-CM

## 2018-01-09 DIAGNOSIS — I10 Essential (primary) hypertension: Secondary | ICD-10-CM

## 2018-01-09 DIAGNOSIS — E119 Type 2 diabetes mellitus without complications: Principal | ICD-10-CM

## 2018-01-09 DIAGNOSIS — F410 Panic disorder [episodic paroxysmal anxiety] without agoraphobia: Secondary | ICD-10-CM

## 2018-01-09 DIAGNOSIS — F329 Major depressive disorder, single episode, unspecified: Secondary | ICD-10-CM

## 2018-01-09 DIAGNOSIS — E785 Hyperlipidemia, unspecified: Secondary | ICD-10-CM

## 2018-01-09 DIAGNOSIS — K219 Gastro-esophageal reflux disease without esophagitis: Secondary | ICD-10-CM

## 2018-01-09 MED ORDER — ALPRAZOLAM 0.25 MG PO TABS
.25 mg | Freq: Two times a day (BID) | ORAL | 0 refills | Status: CP | PRN
Start: 2018-01-09 — End: ?

## 2018-01-13 ENCOUNTER — Encounter: Attending: Internal Medicine | Primary: Internal Medicine
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
## 2018-01-16 ENCOUNTER — Encounter: Payer: Self-pay | Admitting: Internal Medicine

## 2018-01-16 ENCOUNTER — Ambulatory Visit: Payer: Medicare Other | Admitting: Internal Medicine

## 2018-01-16 ENCOUNTER — Encounter: Attending: Internal Medicine | Primary: Internal Medicine

## 2018-01-16 VITALS — BP 92/62 | HR 98 | Ht 61.0 in | Wt 113.0 lb

## 2018-01-16 DIAGNOSIS — J9611 Chronic respiratory failure with hypoxia: Secondary | ICD-10-CM | POA: Diagnosis not present

## 2018-01-16 DIAGNOSIS — R0602 Shortness of breath: Secondary | ICD-10-CM

## 2018-01-16 DIAGNOSIS — R0600 Dyspnea, unspecified: Secondary | ICD-10-CM

## 2018-01-16 MED ORDER — CLONAZEPAM 0.5 MG PO TABS: 0.5000 mg | ORAL_TABLET | Freq: Every day | ORAL | 0 refills | Status: DC | PRN

## 2018-01-16 NOTE — Patient Instructions (Signed)
Continue taking currently medications. I have prescribed clonazepam to be used as needed for dyspnea.

## 2018-01-23 ENCOUNTER — Other Ambulatory Visit: Payer: Self-pay | Admitting: Internal Medicine

## 2018-01-28 ENCOUNTER — Encounter: Payer: Self-pay | Admitting: Internal Medicine

## 2018-01-28 DIAGNOSIS — J3089 Other allergic rhinitis: Secondary | ICD-10-CM

## 2018-01-28 DIAGNOSIS — K210 Gastro-esophageal reflux disease with esophagitis: Secondary | ICD-10-CM

## 2018-01-28 DIAGNOSIS — R42 Dizziness and giddiness: Principal | ICD-10-CM

## 2018-01-28 DIAGNOSIS — J449 Chronic obstructive pulmonary disease, unspecified: Secondary | ICD-10-CM

## 2018-01-29 MED ORDER — ALBUTEROL SULFATE HFA 108 (90 BASE) MCG/ACT IN AERS
1 refills | Status: CP
Start: 2018-01-29 — End: 2018-03-17

## 2018-01-29 MED ORDER — FLUTICASONE PROPIONATE 50 MCG/ACT NA SUSP
1 refills | Status: CP
Start: 2018-01-29 — End: 2018-03-17

## 2018-01-29 MED ORDER — GABAPENTIN 400 MG PO CAPS
0 refills | Status: CP
Start: 2018-01-29 — End: 2018-05-05

## 2018-01-29 MED ORDER — ESOMEPRAZOLE MAGNESIUM 40 MG PO CPDR
1 refills | Status: CP
Start: 2018-01-29 — End: ?

## 2018-01-29 MED ORDER — MECLIZINE HCL 25 MG PO TABS
2 refills | Status: CP
Start: 2018-01-29 — End: ?
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
## 2018-02-02 ENCOUNTER — Encounter: Payer: Self-pay | Admitting: Internal Medicine

## 2018-02-02 ENCOUNTER — Ambulatory Visit: Payer: Medicare Other | Admitting: Internal Medicine

## 2018-02-02 VITALS — BP 98/66 | HR 117 | Resp 16 | Ht 62.0 in | Wt 116.0 lb

## 2018-02-02 DIAGNOSIS — R0602 Shortness of breath: Secondary | ICD-10-CM

## 2018-02-02 DIAGNOSIS — J9611 Chronic respiratory failure with hypoxia: Secondary | ICD-10-CM | POA: Diagnosis not present

## 2018-02-02 DIAGNOSIS — R0600 Dyspnea, unspecified: Secondary | ICD-10-CM

## 2018-02-02 MED ORDER — FUROSEMIDE 20 MG PO TABS: 20.0000 mg | ORAL_TABLET | Freq: Every day | ORAL | 1 refills | Status: DC

## 2018-02-02 NOTE — Patient Instructions (Signed)
Start lasix once daily.  Continue potassium supplement.

## 2018-02-03 ENCOUNTER — Telehealth: Payer: Self-pay | Admitting: Internal Medicine

## 2018-02-03 NOTE — Telephone Encounter (Signed)
Sherri with [facility] calling to let us know the images are in We should be able to see them now

## 2018-02-03 NOTE — Telephone Encounter (Signed)
Returned call to [facility] who states she had not called back. This is a duplicate message. Nothing further needed.

## 2018-02-03 NOTE — Telephone Encounter (Signed)
Please call pt regarding chest xray results .

## 2018-02-03 NOTE — Telephone Encounter (Signed)
Returned call to [facility] with [facility]. Notified that we still are not able to see images in the system. Called and left a message for Traci with imaging because Misty spoke with her yesterday while patient was in clinic and she was going to load the last 3 images. Nothing further can be done until images loaded. Advised that spouse call [employer] [facility] and inquire as well.

## 2018-02-04 ENCOUNTER — Ambulatory Visit: Payer: Medicare Other | Admitting: Internal Medicine

## 2018-02-05 ENCOUNTER — Ambulatory Visit: Payer: Medicare Other | Admitting: Internal Medicine

## 2018-02-16 ENCOUNTER — Ambulatory Visit: Attending: Internal Medicine | Primary: Internal Medicine

## 2018-02-16 DIAGNOSIS — F419 Anxiety disorder, unspecified: Secondary | ICD-10-CM

## 2018-02-16 DIAGNOSIS — E119 Type 2 diabetes mellitus without complications: Principal | ICD-10-CM

## 2018-02-16 DIAGNOSIS — F329 Major depressive disorder, single episode, unspecified: Secondary | ICD-10-CM

## 2018-02-16 DIAGNOSIS — Z6841 Body Mass Index (BMI) 40.0 and over, adult: Principal | ICD-10-CM

## 2018-02-16 DIAGNOSIS — Z9011 Acquired absence of right breast and nipple: Secondary | ICD-10-CM

## 2018-02-16 DIAGNOSIS — I10 Essential (primary) hypertension: Secondary | ICD-10-CM

## 2018-02-16 DIAGNOSIS — E785 Hyperlipidemia, unspecified: Secondary | ICD-10-CM

## 2018-02-16 DIAGNOSIS — E1142 Type 2 diabetes mellitus with diabetic polyneuropathy: Secondary | ICD-10-CM

## 2018-02-16 DIAGNOSIS — E1165 Type 2 diabetes mellitus with hyperglycemia: Secondary | ICD-10-CM

## 2018-02-16 DIAGNOSIS — M1990 Unspecified osteoarthritis, unspecified site: Secondary | ICD-10-CM

## 2018-02-16 DIAGNOSIS — K219 Gastro-esophageal reflux disease without esophagitis: Secondary | ICD-10-CM

## 2018-03-01 ENCOUNTER — Other Ambulatory Visit: Payer: Self-pay

## 2018-03-01 ENCOUNTER — Emergency Department: Payer: Medicare Other

## 2018-03-01 ENCOUNTER — Inpatient Hospital Stay
Admission: EM | Admit: 2018-03-01 | Discharge: 2018-03-03 | DRG: 193 | Disposition: A | Discharge status: Home Health | Payer: Medicare Other | Attending: Internal Medicine | Admitting: Internal Medicine

## 2018-03-01 DIAGNOSIS — Z923 Personal history of irradiation: Secondary | ICD-10-CM

## 2018-03-01 DIAGNOSIS — F419 Anxiety disorder, unspecified: Secondary | ICD-10-CM | POA: Diagnosis present

## 2018-03-01 DIAGNOSIS — Z7951 Long term (current) use of inhaled steroids: Secondary | ICD-10-CM

## 2018-03-01 DIAGNOSIS — E039 Hypothyroidism, unspecified: Secondary | ICD-10-CM | POA: Diagnosis present

## 2018-03-01 DIAGNOSIS — J810 Acute pulmonary edema: Secondary | ICD-10-CM | POA: Diagnosis present

## 2018-03-01 DIAGNOSIS — Z79890 Hormone replacement therapy (postmenopausal): Secondary | ICD-10-CM | POA: Diagnosis not present

## 2018-03-01 DIAGNOSIS — Z7982 Long term (current) use of aspirin: Secondary | ICD-10-CM | POA: Diagnosis not present

## 2018-03-01 DIAGNOSIS — J9601 Acute respiratory failure with hypoxia: Secondary | ICD-10-CM | POA: Diagnosis present

## 2018-03-01 DIAGNOSIS — E46 Unspecified protein-calorie malnutrition: Secondary | ICD-10-CM | POA: Diagnosis present

## 2018-03-01 DIAGNOSIS — C8590 Non-Hodgkin lymphoma, unspecified, unspecified site: Secondary | ICD-10-CM | POA: Diagnosis present

## 2018-03-01 DIAGNOSIS — J9621 Acute and chronic respiratory failure with hypoxia: Secondary | ICD-10-CM | POA: Diagnosis present

## 2018-03-01 DIAGNOSIS — Z8249 Family history of ischemic heart disease and other diseases of the circulatory system: Secondary | ICD-10-CM | POA: Diagnosis not present

## 2018-03-01 DIAGNOSIS — Z7952 Long term (current) use of systemic steroids: Secondary | ICD-10-CM | POA: Diagnosis not present

## 2018-03-01 DIAGNOSIS — Z9221 Personal history of antineoplastic chemotherapy: Secondary | ICD-10-CM | POA: Diagnosis not present

## 2018-03-01 DIAGNOSIS — Z833 Family history of diabetes mellitus: Secondary | ICD-10-CM

## 2018-03-01 DIAGNOSIS — J189 Pneumonia, unspecified organism: Principal | ICD-10-CM | POA: Diagnosis present

## 2018-03-01 DIAGNOSIS — Z9981 Dependence on supplemental oxygen: Secondary | ICD-10-CM | POA: Diagnosis not present

## 2018-03-01 DIAGNOSIS — Z825 Family history of asthma and other chronic lower respiratory diseases: Secondary | ICD-10-CM | POA: Diagnosis not present

## 2018-03-01 DIAGNOSIS — I248 Other forms of acute ischemic heart disease: Secondary | ICD-10-CM | POA: Diagnosis present

## 2018-03-01 DIAGNOSIS — Y95 Nosocomial condition: Secondary | ICD-10-CM | POA: Diagnosis present

## 2018-03-01 LAB — BLOOD GAS, VENOUS
Acid-Base Excess: 3.7 mmol/L — ABNORMAL HIGH (ref 0.0–2.0)
BICARBONATE: 26.1 mmol/L (ref 20.0–28.0)
O2 SAT: 98.3 %
PATIENT TEMPERATURE: 37
PO2 VEN: 98 mmHg — AB (ref 32.0–45.0)
pCO2, Ven: 32 mmHg — ABNORMAL LOW (ref 44.0–60.0)
pH, Ven: 7.52 — ABNORMAL HIGH (ref 7.250–7.430)

## 2018-03-01 LAB — CBC WITH DIFFERENTIAL/PLATELET
BASOS ABS: 0 10*3/uL (ref 0–0.1)
Basophils Relative: 1 %
Eosinophils Absolute: 0.1 10*3/uL (ref 0–0.7)
Eosinophils Relative: 3 %
HEMATOCRIT: 31.3 % — AB (ref 35.0–47.0)
Hemoglobin: 10.8 g/dL — ABNORMAL LOW (ref 12.0–16.0)
LYMPHS PCT: 11 %
Lymphs Abs: 0.6 10*3/uL — ABNORMAL LOW (ref 1.0–3.6)
MCH: 33.6 pg (ref 26.0–34.0)
MCHC: 34.4 g/dL (ref 32.0–36.0)
MCV: 97.7 fL (ref 80.0–100.0)
Monocytes Absolute: 0.4 10*3/uL (ref 0.2–0.9)
Monocytes Relative: 8 %
NEUTROS ABS: 3.9 10*3/uL (ref 1.4–6.5)
Neutrophils Relative %: 77 %
PLATELETS: 188 10*3/uL (ref 150–440)
RBC: 3.21 MIL/uL — AB (ref 3.80–5.20)
RDW: 17.3 % — ABNORMAL HIGH (ref 11.5–14.5)
WBC: 5 10*3/uL (ref 3.6–11.0)

## 2018-03-01 LAB — COMPREHENSIVE METABOLIC PANEL
ALBUMIN: 3 g/dL — AB (ref 3.5–5.0)
ALK PHOS: 51 U/L (ref 38–126)
ALT: 16 U/L (ref 0–44)
ANION GAP: 5 (ref 5–15)
AST: 21 U/L (ref 15–41)
BUN: 24 mg/dL — AB (ref 8–23)
CO2: 29 mmol/L (ref 22–32)
Calcium: 9 mg/dL (ref 8.9–10.3)
Chloride: 105 mmol/L (ref 98–111)
Creatinine, Ser: 1.04 mg/dL — ABNORMAL HIGH (ref 0.44–1.00)
GFR calc Af Amer: 60 mL/min — ABNORMAL LOW (ref >60)
GFR calc non Af Amer: 52 mL/min — ABNORMAL LOW (ref >60)
GLUCOSE: 105 mg/dL — AB (ref 70–99)
POTASSIUM: 3.5 mmol/L (ref 3.5–5.1)
SODIUM: 139 mmol/L (ref 135–145)
Total Bilirubin: 0.7 mg/dL (ref 0.3–1.2)
Total Protein: 5.8 g/dL — ABNORMAL LOW (ref 6.5–8.1)

## 2018-03-01 LAB — PROCALCITONIN: Procalcitonin: <0.1 ng/mL

## 2018-03-01 LAB — LACTIC ACID, PLASMA: LACTIC ACID, VENOUS: 1.3 mmol/L (ref 0.5–1.9)

## 2018-03-01 LAB — BRAIN NATRIURETIC PEPTIDE: B Natriuretic Peptide: 554 pg/mL — ABNORMAL HIGH (ref 0.0–100.0)

## 2018-03-01 LAB — TROPONIN I: Troponin I: 0.06 ng/mL (ref <0.03)

## 2018-03-01 MED ORDER — IPRATROPIUM-ALBUTEROL 0.5-2.5 (3) MG/3ML IN SOLN
3.0000 mL | Freq: Four times a day (QID) | RESPIRATORY_TRACT | Status: DC
  Administered 2018-03-02 – 2018-03-03 (×6): 3 mL via RESPIRATORY_TRACT
  Filled 2018-03-01 (×5): qty 3

## 2018-03-01 MED ORDER — ADULT MULTIVITAMIN W/MINERALS CH
1.0000 | ORAL_TABLET | Freq: Every day | ORAL | Status: DC
  Administered 2018-03-02 – 2018-03-03 (×2): 1 via ORAL
  Filled 2018-03-01 (×2): qty 1

## 2018-03-01 MED ORDER — CEFEPIME HCL 2 G IJ SOLR
2.0000 g | Freq: Once | INTRAMUSCULAR | Status: AC
  Administered 2018-03-01: 2 g via INTRAVENOUS
  Filled 2018-03-01: qty 2

## 2018-03-01 MED ORDER — PANTOPRAZOLE SODIUM 40 MG PO TBEC
40.0000 mg | DELAYED_RELEASE_TABLET | Freq: Every day | ORAL | Status: DC
  Administered 2018-03-02 – 2018-03-03 (×2): 40 mg via ORAL
  Filled 2018-03-01 (×2): qty 1

## 2018-03-01 MED ORDER — LEVOTHYROXINE SODIUM 25 MCG PO TABS
25.0000 ug | ORAL_TABLET | Freq: Every day | ORAL | Status: DC
  Administered 2018-03-02 – 2018-03-03 (×2): 25 ug via ORAL
  Filled 2018-03-01 (×2): qty 1

## 2018-03-01 MED ORDER — ALBUTEROL SULFATE (2.5 MG/3ML) 0.083% IN NEBU: 5.0000 mg | INHALATION_SOLUTION | RESPIRATORY_TRACT | Status: DC | PRN

## 2018-03-01 MED ORDER — METHYLPREDNISOLONE SODIUM SUCC 125 MG IJ SOLR
60.0000 mg | Freq: Four times a day (QID) | INTRAMUSCULAR | Status: DC
  Administered 2018-03-01 – 2018-03-02 (×2): 60 mg via INTRAVENOUS
  Filled 2018-03-01 (×2): qty 2

## 2018-03-01 MED ORDER — IPRATROPIUM-ALBUTEROL 0.5-2.5 (3) MG/3ML IN SOLN
RESPIRATORY_TRACT | Status: AC
  Filled 2018-03-01: qty 3

## 2018-03-01 MED ORDER — FUROSEMIDE 10 MG/ML IJ SOLN
40.0000 mg | Freq: Once | INTRAMUSCULAR | Status: AC
  Administered 2018-03-01: 40 mg via INTRAVENOUS
  Filled 2018-03-01: qty 4

## 2018-03-01 MED ORDER — VANCOMYCIN HCL IN DEXTROSE 1-5 GM/200ML-% IV SOLN: 1000.0000 mg | Freq: Once | INTRAVENOUS | Status: DC

## 2018-03-01 MED ORDER — SODIUM CHLORIDE 0.9 % IV BOLUS
1000.0000 mL | Freq: Once | INTRAVENOUS | Status: AC
  Administered 2018-03-01: 1000 mL via INTRAVENOUS

## 2018-03-01 MED ORDER — ALPRAZOLAM 0.5 MG PO TABS
0.5000 mg | ORAL_TABLET | Freq: Every day | ORAL | Status: DC
  Administered 2018-03-01 – 2018-03-02 (×2): 0.5 mg via ORAL
  Filled 2018-03-01 (×2): qty 1

## 2018-03-01 MED ORDER — ASPIRIN EC 81 MG PO TBEC
81.0000 mg | DELAYED_RELEASE_TABLET | Freq: Every day | ORAL | Status: DC
  Administered 2018-03-02 – 2018-03-03 (×2): 81 mg via ORAL
  Filled 2018-03-01 (×2): qty 1

## 2018-03-01 MED ORDER — ENSURE ENLIVE PO LIQD
237.0000 mL | Freq: Three times a day (TID) | ORAL | Status: DC
  Administered 2018-03-02 – 2018-03-03 (×4): 237 mL via ORAL

## 2018-03-01 MED ORDER — IPRATROPIUM-ALBUTEROL 0.5-2.5 (3) MG/3ML IN SOLN
3.0000 mL | Freq: Once | RESPIRATORY_TRACT | Status: DC
  Filled 2018-03-01: qty 3

## 2018-03-01 MED ORDER — BUDESONIDE 0.5 MG/2ML IN SUSP
0.5000 mg | Freq: Two times a day (BID) | RESPIRATORY_TRACT | Status: DC
  Administered 2018-03-02 – 2018-03-03 (×3): 0.5 mg via RESPIRATORY_TRACT
  Filled 2018-03-01 (×2): qty 2

## 2018-03-01 MED ORDER — METHYLPREDNISOLONE SODIUM SUCC 125 MG IJ SOLR: 125.0000 mg | Freq: Once | INTRAMUSCULAR | Status: DC

## 2018-03-01 MED ORDER — SODIUM CHLORIDE 0.9 % IV SOLN
2.0000 g | Freq: Two times a day (BID) | INTRAVENOUS | Status: DC
  Administered 2018-03-02 – 2018-03-03 (×3): 2 g via INTRAVENOUS
  Filled 2018-03-01 (×4): qty 2

## 2018-03-01 MED ORDER — CITALOPRAM HYDROBROMIDE 20 MG PO TABS
20.0000 mg | ORAL_TABLET | Freq: Every day | ORAL | Status: DC
  Administered 2018-03-02 – 2018-03-03 (×2): 20 mg via ORAL
  Filled 2018-03-01 (×2): qty 1

## 2018-03-01 MED ORDER — SODIUM CHLORIDE 0.9 % IV SOLN: 1.0000 g | Freq: Three times a day (TID) | INTRAVENOUS | Status: DC

## 2018-03-01 MED ORDER — ENOXAPARIN SODIUM 40 MG/0.4ML ~~LOC~~ SOLN
40.0000 mg | SUBCUTANEOUS | Status: DC
  Administered 2018-03-02: 40 mg via SUBCUTANEOUS
  Filled 2018-03-01: qty 0.4

## 2018-03-01 NOTE — ED Triage Notes (Signed)
Pt arrives from home via [facility], EMS reports called to scene sudden onset respiratory distress sats 70% RA, Bipap, 125mg  solumedrol PTA. Protocol initiated.

## 2018-03-01 NOTE — Progress Notes (Signed)
Family Meeting Note  Advance Directive:yes  Today a meeting took place with the Patient and spouse.  Patient is able to participate.  The following clinical team members were present during this meeting:MD  The following were discussed:Patient's diagnosis: , Patient's progosis: Unable to determine and Goals for treatment: Continue present management  Additional follow-up to be provided: prn  Time spent during discussion:20 minutes  Evette Doffing, MD

## 2018-03-01 NOTE — ED Provider Notes (Signed)
[facility] Emergency Department Provider Note ____________________________________________   First MD Initiated Contact with Patient 03/01/18 1903     (approximate)  I have reviewed the triage vital signs and the nursing notes.   HISTORY  Chief Complaint Respiratory Distress  Level 5 caveat: History of present illness limited due to acute respiratory distress  HPI Anna Mcbride is a 72 y.o. female with PMH as noted below who presents with shortness of breath, acute onset over the last several hours.  Per EMS, the O2 saturation was in the 70s on their arrival, and the patient was lethargic.  Patient is on 2 L O2 at home.  She was placed on CPAP by EMS with some improvement.  Past Medical History:  Diagnosis Date  . Acquired hypothyroidism 07/01/2013  . Acute respiratory failure ([place]) 11/29/2014  . 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 Asthma   . Back pain with sciatica 05/07/2017  . Biliary sludge 12/19/2017  . Lymphoma, non-Hodgkin's ([place]) 06/30/2013  . Non Hodgkin's lymphoma ([place]) [id number]  . Personal history of chemotherapy   . Recurrent upper respiratory infection (URI) 09/01/2015  . 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 Malnutrition of moderate degree 01/08/2018  . Adjustment disorder with anxiety 01/06/2018  . Epigastric abdominal pain   . History of esophagitis 12/29/2017  . GERD (gastroesophageal reflux disease) 12/19/2017  . Biliary sludge 12/19/2017  . CKD (chronic kidney disease), stage III ([place]) 12/19/2017  . Chronic respiratory failure with hypoxia, on home O2 therapy ([place]) 12/16/2017  . Back pain with sciatica 05/07/2017  . Sepsis ([place]) 09/12/2016  . Pneumothorax   . 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 Recurrent upper respiratory infection (URI) 09/01/2015  . Acute respiratory failure with hypoxia ([place])   . Anemia in  neoplastic disease 12/01/2014  . SOB (shortness of breath)   . Pneumonia   . Acute respiratory failure ([place]) 11/29/2014  . History of digestive disease 11/29/2014  . HLD (hyperlipidemia) 11/29/2014  . Adult hypothyroidism 11/29/2014  . Lymphoma ([place]) 11/29/2014  . RAD (reactive airway disease) 11/29/2014  . 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 Pain in shoulder 11/01/2014  . Fracture of humerus, proximal 11/01/2014  . Febrile neutropenia ([place]) 11/08/2013  . Decreased potassium in the blood 09/02/2013  . Acquired hypothyroidism 07/01/2013  . Lymphoma, non-Hodgkin's ([place]) 06/30/2013  . Cervical lymphadenopathy 04/20/2013  . History of lymphoma 04/20/2013  . Dyspnea 01/29/2011  . Cough 01/29/2011    Past Surgical History:  Procedure Laterality Date  . 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 NASAL SINUS SURGERY  08/07/05 09/09/05   x 2   . VIDEO BRONCHOSCOPY Bilateral 11/30/2014   Procedure: VIDEO BRONCHOSCOPY WITHOUT FLUORO;  Surgeon: Flora Lipps, MD;  Location: [facility] ORS;  Service: Cardiopulmonary;  Laterality: Bilateral;    Prior to Admission medications   Medication Sig Start Date End Date Taking? Authorizing Provider  ALPRAZolam Duanne Moron) 0.5 MG tablet Take 1 tablet (0.5 mg total) by mouth at bedtime. 01/08/18  Yes Gouru, Illene Silver, MD  aspirin EC 81 MG tablet Take 81 mg by mouth daily.   Yes [provider]  budesonide (PULMICORT) 0.5 MG/2ML nebulizer solution Take 2 mLs (0.5 mg total) by nebulization 2 (two) times daily. J96.01 01/23/18  Yes Kasa, Maretta Bees, MD  citalopram (CELEXA) 20 MG tablet TAKE 1 TABLET BY MOUTH ONCE DAILY 12/16/17  Yes  Cammie Sickle, MD  levothyroxine (SYNTHROID, LEVOTHROID) 25 MCG tablet TAKE 1 TABLET BY MOUTH ONCE DAILY ON AN EMPTY STOMACH. WAIT 30 MINUTES BEFORE TAKING OTHER MEDS. 12/03/17  Yes Cammie Sickle, MD  Multiple Vitamin (MULTIVITAMIN WITH MINERALS) TABS tablet Take 1 tablet by mouth daily. 01/09/18  Yes Gouru, Illene Silver, MD    omeprazole (PRILOSEC) 20 MG capsule TAKE 1 CAPSULE BY MOUTH TWICE DAILY BEFORE MEALS 02/03/17  Yes Cammie Sickle, MD  polyethylene glycol (MIRALAX / GLYCOLAX) packet Take 17 g by mouth daily. 01/09/18  Yes Gouru, Illene Silver, MD  potassium chloride SA (K-DUR,KLOR-CON) 20 MEQ tablet Take 1 tablet (20 mEq total) by mouth daily. 10/27/17  Yes Cammie Sickle, MD  predniSONE (DELTASONE) 10 MG tablet Take 1 tablet by mouth daily. 02/25/18  Yes [provider]  vitamin C (ASCORBIC ACID) 500 MG tablet Take 500 mg by mouth daily.   Yes [provider]  acetaminophen (TYLENOL) 325 MG tablet Take 2 tablets (650 mg total) by mouth every 6 (six) hours as needed for mild pain (headache). 01/08/18   Gouru, Illene Silver, MD  albuterol (PROVENTIL HFA;VENTOLIN HFA) 108 (90 BASE) MCG/ACT inhaler Inhale 1 puff into the lungs every 6 (six) hours as needed for wheezing or shortness of breath.    [provider]  albuterol (PROVENTIL) (2.5 MG/3ML) 0.083% nebulizer solution Take 3 mLs (2.5 mg total) by nebulization every 4 (four) hours as needed for wheezing or shortness of breath. 01/08/18   Gouru, Illene Silver, MD  clonazePAM (KLONOPIN) 0.5 MG tablet Take 1 tablet (0.5 mg total) by mouth daily as needed for anxiety (severe trouble breathing.). Patient not taking: Reported on 03/01/2018 01/16/18   [name], MD  feeding supplement, ENSURE ENLIVE, (ENSURE ENLIVE) LIQD Take 237 mLs by mouth 3 (three) times daily between meals. 01/08/18   [name], MD  furosemide (LASIX) 20 MG tablet Take 1 tablet (20 mg total) by mouth daily. Patient not taking: Reported on 03/01/2018 02/02/18   [name], MD  ipratropium-albuterol (DUONEB) 0.5-2.5 (3) MG/3ML SOLN Take 3 mLs by nebulization every 6 (six) hours. 01/08/18   Gouru, Illene Silver, MD  ondansetron (ZOFRAN ODT) 4 MG disintegrating tablet Take 1 tablet (4 mg total) by mouth every 8 (eight) hours as needed for nausea or vomiting. Patient not taking:  Reported on 03/01/2018 01/08/18   [name], MD  Respiratory Therapy Supplies (FLUTTER) DEVI Use 10-15 times daily 07/29/16   Flora Lipps, MD  traMADol (ULTRAM) 50 MG tablet Take 1 tablet (50 mg total) by mouth every 6 (six) hours as needed for moderate pain. 01/08/18   [name], MD    Allergies Hydrocodone-homatropine; Tussionex pennkinetic er [employer] polst-cpm polst er]; and Ciprofloxacin  Family History  Problem Relation Age of Onset  . Asthma Son   . Congestive Heart Failure Mother   . Diabetes Brother   . Pulmonary embolism Father   . Breast cancer Neg Hx     Social History Social History   Tobacco Use  . Smoking status: Never Smoker  . Smokeless tobacco: Never Used  Substance Use Topics  . Alcohol use: Not Currently    Frequency: Never    Comment: a couple glasses of wine a year  . Drug use: No    Review of Systems Level 5 caveat: Unable to obtain review of systems due to acute respiratory distress   ____________________________________________   PHYSICAL EXAM:  VITAL SIGNS: ED Triage Vitals [03/01/18 1904]  Enc Vitals Group  BP      Pulse Rate (!) 114     Resp (!) 31     Temp      Temp src      SpO2 95 %     Weight      Height      Head Circumference      Peak Flow      Pain Score      Pain Loc      Pain Edu?      Excl. in [place]?     Constitutional: Alert, speaking in short sentences.  Acute respiratory distress. Eyes: Conjunctivae are normal.  EOMI. Head: Atraumatic. Nose: No congestion/rhinnorhea. Mouth/Throat: Mucous membranes are somewhat dry.   Neck: Normal range of motion.  Cardiovascular: Tachycardic, regular rhythm. Grossly normal heart sounds.  Good peripheral circulation. Respiratory: Increased respiratory effort.  Coarse breath sounds bilaterally. Gastrointestinal: Soft and nontender. No distention.  Genitourinary: No flank tenderness. Musculoskeletal:Extremities warm and well perfused.  Neurologic: Motor intact in all  extremities. Skin:  Skin is warm and dry. No rash noted. Psychiatric: Anxious appearing.  ____________________________________________   LABS (all labs ordered are listed, but only abnormal results are displayed)  Labs Reviewed  COMPREHENSIVE METABOLIC PANEL - Abnormal; Notable for the following components:      Result Value   Glucose, Bld 105 (*)    BUN 24 (*)    Creatinine, Ser 1.04 (*)    Total Protein 5.8 (*)    Albumin 3.0 (*)    GFR calc non Af Amer 52 (*)    GFR calc Af Amer 60 (*)    All other components within normal limits  BRAIN NATRIURETIC PEPTIDE - Abnormal; Notable for the following components:   B Natriuretic Peptide 554.0 (*)    All other components within normal limits  TROPONIN I - Abnormal; Notable for the following components:   Troponin I 0.06 (*)    All other components within normal limits  CBC WITH DIFFERENTIAL/PLATELET - Abnormal; Notable for the following components:   RBC 3.21 (*)    Hemoglobin 10.8 (*)    HCT 31.3 (*)    RDW 17.3 (*)    Lymphs Abs 0.6 (*)    All other components within normal limits  BLOOD GAS, VENOUS - Abnormal; Notable for the following components:   pH, Ven 7.52 (*)    pCO2, Ven 32 (*)    pO2, Ven 98.0 (*)    Acid-Base Excess 3.7 (*)    All other components within normal limits  LACTIC ACID, PLASMA  URINALYSIS, COMPLETE (UACMP) WITH MICROSCOPIC  LACTIC ACID, PLASMA   ____________________________________________  EKG  ED ECG REPORT I, Arta Silence, the attending physician, personally viewed and interpreted this ECG.  Date: 03/01/2018 EKG Time: 1906 Rate: 113 Rhythm: Sinus tachycardia QRS Axis: normal Intervals: Borderline QT ST/T Wave abnormalities: normal Narrative Interpretation: no evidence of acute ischemia  ____________________________________________  RADIOLOGY  CXR: Left upper lobe opacity  ____________________________________________   PROCEDURES  Procedure(s) performed:  No  Procedures  Critical Care performed: Yes  CRITICAL CARE Performed by: Arta Silence   Total critical care time: 35 minutes  Critical care time was exclusive of separately billable procedures and treating other patients.  Critical care was necessary to treat or prevent imminent or life-threatening deterioration.  Critical care was time spent personally by me on the following activities: development of treatment plan with patient and/or surrogate as well as nursing, discussions with consultants, evaluation of patient's response to treatment,  examination of patient, obtaining history from patient or surrogate, ordering and performing treatments and interventions, ordering and review of laboratory studies, ordering and review of radiographic studies, pulse oximetry and re-evaluation of patient's condition. ____________________________________________   INITIAL IMPRESSION / ASSESSMENT AND PLAN / ED COURSE  Pertinent labs & imaging results that were available during my care of the patient were reviewed by me and considered in my medical decision making (see chart for details).  72 year old female with PMH as noted above including history of non-Hodgkin's lymphoma presents with acute onset of shortness of breath and respiratory distress, with hypoxia.  She was placed on CPAP by EMS with good response.  I reviewed the past medical records in epic; the patient was most recently admitted in June of this year for acute on chronic hypoxic respiratory failure secondary tobronchitis versuschronic pulmonary fibrosis and radiation pneumonitis.  On exam, the patient is tachycardic, tachypneic, and still with increased work of breathing.  She has coarse breath sounds bilaterally.  The remainder of the exam is as described above.  Differential includes pneumonia, acute bronchitis, worsening of chronic pulmonary fibrosis or pneumonitis, or less likely cardiac cause.  We will obtain chest x-ray,  lab work-up, give nebulizer, keep the patient on BiPAP, and reassess.  She received steroid en route.  Anticipate admission.  ----------------------------------------- 9:03 PM on 03/01/2018 -----------------------------------------  X-ray shows left upper lobe opacity.  Differential includes HCAP, versus less likely CHF or acute on chronic bronchitis.  I started antibiotics for HCAP.  The patient appears significantly more comfortable and remains fully alert.  I took the patient off BiPAP and placed her on a nasal cannula, and she appears comfortable for now.  I counseled the patient and her family members on the results of the work-up.  I signed the patient out to the hospitalist Dr. Brett Albino.    ____________________________________________   FINAL CLINICAL IMPRESSION(S) / ED DIAGNOSES  Final diagnoses:  Acute respiratory failure with hypoxia ([place])  HCAP (healthcare-associated pneumonia)      NEW MEDICATIONS STARTED DURING THIS VISIT:  New Prescriptions   No medications on file     Note:  This document was prepared using Dragon voice recognition software and may include unintentional dictation errors.    Arta Silence, MD 03/01/18 2104

## 2018-03-01 NOTE — ED Notes (Signed)
Pt and family updated on admission status and assigned room number.

## 2018-03-01 NOTE — ED Notes (Signed)
Pt removed from bipap and placed on O2 at 5L/min via [place] by Dr Cherylann Banas.

## 2018-03-01 NOTE — Progress Notes (Signed)
Patient transported via stretcher to room [facility] bed. Oriented patient to room and room equipment and Fall risk protocol. Currently patient is on 5 liters of oxygen and is tolerating it well. Complains of no pain or discomfort. Verified x2 placement of telemetry leads with [facility].

## 2018-03-01 NOTE — H&P (Addendum)
[facility] [place] NAME: Anna Mcbride    MR#:  [id number]  DATE OF BIRTH:  [birth year]  DATE OF ADMISSION:  03/01/2018  PRIMARY CARE PHYSICIAN: Baxter Hire, MD   REQUESTING/REFERRING PHYSICIAN: Arta Silence, MD  CHIEF COMPLAINT:   Chief Complaint  Patient presents with  . Respiratory Distress    HISTORY OF PRESENT ILLNESS:  Anna Mcbride  is a 72 y.o. 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  She has been checking her pulse ox at home and her O2 saturations have been in the 70s.  She also notes low-grade temperatures to 100.3 and dry cough.  She has been seeing her primary care doctor as an outpatient for shortness of breath and fever.  She has been on a recent course of antibiotics and Lasix, as well as steroid burst.  Her PCP recently started her on prednisone 10 mg daily for maintenance therapy.  Of note she was recently hospitalized from 6/14-6/20 for shortness of breath that was thought to be secondary to her underlying lung disease.   In the ED, she initially required BiPAP.  She was transitioned to 5 L of oxygen by nasal cannula without any issues.  Labs are significant for BNP 554 and troponin 0.06.  Chest x-ray showed worsening aeration of the left upper lobe possibly pneumonia or asymmetric edema.  She was started on vancomycin and cefepime for HCAP and hospitalist was called for admission.  PAST MEDICAL HISTORY:   Past Medical History:  Diagnosis Date  . Acquired hypothyroidism 07/01/2013  . Acute respiratory failure ([place]) 11/29/2014  . 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 Asthma   . Back pain with sciatica 05/07/2017  . Biliary sludge 12/19/2017  . Lymphoma, non-Hodgkin's ([place]) 06/30/2013  . Non Hodgkin's lymphoma ([place])  [id number]  . Personal history of chemotherapy   . Recurrent upper respiratory infection (URI) 09/01/2015  . 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 NASAL SINUS SURGERY  08/07/05 09/09/05   x 2   . VIDEO BRONCHOSCOPY Bilateral 11/30/2014   Procedure: VIDEO BRONCHOSCOPY WITHOUT FLUORO;  Surgeon: Flora Lipps, MD;  Location: [facility] ORS;  Service: Cardiopulmonary;  Laterality: Bilateral;    SOCIAL HISTORY:   Social History   Tobacco Use  . Smoking status: Never Smoker  . Smokeless tobacco: Never Used  Substance Use Topics  . Alcohol use: Not Currently    Frequency: Never    Comment: a couple glasses of wine a year    FAMILY HISTORY:   Family History  Problem Relation Age of Onset  . Asthma Son   . Congestive Heart Failure Mother   . Diabetes Brother   . Pulmonary embolism Father   . Breast cancer Neg Hx     DRUG ALLERGIES:   Allergies  Allergen Reactions  . Hydrocodone-Homatropine Itching  . Tussionex Pennkinetic Er [Hydrocod Polst-Cpm Polst Er] Itching  . Ciprofloxacin Rash    REVIEW OF SYSTEMS:   Review of Systems  Constitutional: Positive for fever and malaise/fatigue. Negative for chills.  HENT: Negative for congestion and sore throat.   Eyes: Negative for blurred vision and double vision.  Respiratory: Positive for cough,  shortness of breath and wheezing. Negative for sputum production.   Cardiovascular: Negative for chest pain, palpitations and leg swelling.  Gastrointestinal: Negative for abdominal pain, nausea and vomiting.  Genitourinary: Positive for frequency. Negative for dysuria and urgency.  Musculoskeletal: Negative for back pain and joint pain.  Neurological: Positive for weakness. Negative for dizziness and headaches.  Psychiatric/Behavioral: Negative for depression. The patient is nervous/anxious.     MEDICATIONS AT HOME:    Prior to Admission medications   Medication Sig Start Date End Date Taking? Authorizing Provider  ALPRAZolam Duanne Moron) 0.5 MG tablet Take 1 tablet (0.5 mg total) by mouth at bedtime. 01/08/18  Yes Gouru, Illene Silver, MD  aspirin EC 81 MG tablet Take 81 mg by mouth daily.   Yes [provider]  budesonide (PULMICORT) 0.5 MG/2ML nebulizer solution Take 2 mLs (0.5 mg total) by nebulization 2 (two) times daily. J96.01 01/23/18  Yes Kasa, Maretta Bees, MD  citalopram (CELEXA) 20 MG tablet TAKE 1 TABLET BY MOUTH ONCE DAILY 12/16/17  Yes Cammie Sickle, MD  levothyroxine (SYNTHROID, LEVOTHROID) 25 MCG tablet TAKE 1 TABLET BY MOUTH ONCE DAILY ON AN EMPTY STOMACH. WAIT 30 MINUTES BEFORE TAKING OTHER MEDS. 12/03/17  Yes Cammie Sickle, MD  Multiple Vitamin (MULTIVITAMIN WITH MINERALS) TABS tablet Take 1 tablet by mouth daily. 01/09/18  Yes Gouru, Illene Silver, MD  omeprazole (PRILOSEC) 20 MG capsule TAKE 1 CAPSULE BY MOUTH TWICE DAILY BEFORE MEALS 02/03/17  Yes Cammie Sickle, MD  polyethylene glycol (MIRALAX / GLYCOLAX) packet Take 17 g by mouth daily. 01/09/18  Yes Gouru, Illene Silver, MD  potassium chloride SA (K-DUR,KLOR-CON) 20 MEQ tablet Take 1 tablet (20 mEq total) by mouth daily. 10/27/17  Yes Cammie Sickle, MD  predniSONE (DELTASONE) 10 MG tablet Take 1 tablet by mouth daily. 02/25/18  Yes [provider]  vitamin C (ASCORBIC ACID) 500 MG tablet Take 500 mg by mouth daily.   Yes [provider]  acetaminophen (TYLENOL) 325 MG tablet Take 2 tablets (650 mg total) by mouth every 6 (six) hours as needed for mild pain (headache). 01/08/18   Gouru, Illene Silver, MD  albuterol (PROVENTIL HFA;VENTOLIN HFA) 108 (90 BASE) MCG/ACT inhaler Inhale 1 puff into the lungs every 6 (six) hours as needed for wheezing or shortness of breath.    [provider]  albuterol (PROVENTIL) (2.5 MG/3ML) 0.083% nebulizer solution Take 3 mLs (2.5 mg total) by nebulization every 4 (four) hours as needed for  wheezing or shortness of breath. 01/08/18   Gouru, Illene Silver, MD  clonazePAM (KLONOPIN) 0.5 MG tablet Take 1 tablet (0.5 mg total) by mouth daily as needed for anxiety (severe trouble breathing.). Patient not taking: Reported on 03/01/2018 01/16/18   [name], MD  feeding supplement, ENSURE ENLIVE, (ENSURE ENLIVE) LIQD Take 237 mLs by mouth 3 (three) times daily between meals. 01/08/18   [name], MD  furosemide (LASIX) 20 MG tablet Take 1 tablet (20 mg total) by mouth daily. Patient not taking: Reported on 03/01/2018 02/02/18   [name], MD  ipratropium-albuterol (DUONEB) 0.5-2.5 (3) MG/3ML SOLN Take 3 mLs by nebulization every 6 (six) hours. 01/08/18   Gouru, Illene Silver, MD  ondansetron (ZOFRAN ODT) 4 MG disintegrating tablet Take 1 tablet (4 mg total) by mouth every 8 (eight) hours as needed for nausea or vomiting. Patient not taking: Reported on 03/01/2018 01/08/18   [name], MD  Respiratory Therapy Supplies (FLUTTER) DEVI Use 10-15 times daily 07/29/16   Flora Lipps, MD  traMADol (ULTRAM) 50 MG tablet  Take 1 tablet (50 mg total) by mouth every 6 (six) hours as needed for moderate pain. 01/08/18   [name], MD      VITAL SIGNS:  Blood pressure 106/73, pulse (!) 102, temperature 99.7 F (37.6 C), resp. rate (!) 31, height 5\' 2"  (1.575 m), weight 52.6 kg, SpO2 98 %.  PHYSICAL EXAMINATION:  Physical Exam  GENERAL:  72 y.o.-year-old patient lying in the bed in mild distress. EYES: Pupils equal, round, reactive to light and accommodation. No scleral icterus. Extraocular muscles intact.  HEENT: Head atraumatic, normocephalic. Oropharynx and nasopharynx clear. Mildly dry mucous membranes. NECK:  Supple, no jugular venous distention. No thyroid enlargement, no tenderness.  LUNGS: Mildly increased work of breathing, mild accessory muscle use, diffuse expiratory and inspiratory wheezing throughout all lung fields  CARDIOVASCULAR: Tachycardic, regular rhythm, S1, S2 normal. No  murmurs, rubs, or gallops.  ABDOMEN: Soft, nontender, nondistended. Bowel sounds present. No organomegaly or mass.  EXTREMITIES: No pedal edema, cyanosis, or clubbing.  NEUROLOGIC: Cranial nerves II through XII are intact. +global weakness, sensation intact. Gait not checked.  PSYCHIATRIC: The patient is alert and oriented x 3.  SKIN: No obvious rash, lesion, or ulcer.   LABORATORY PANEL:   CBC Recent Labs  Lab 03/01/18 1912  WBC 5.0  HGB 10.8*  HCT 31.3*  PLT 188   ------------------------------------------------------------------------------------------------------------------  Chemistries  Recent Labs  Lab 03/01/18 1912  NA 139  K 3.5  CL 105  CO2 29  GLUCOSE 105*  BUN 24*  CREATININE 1.04*  CALCIUM 9.0  AST 21  ALT 16  ALKPHOS 51  BILITOT 0.7   ------------------------------------------------------------------------------------------------------------------  Cardiac Enzymes Recent Labs  Lab 03/01/18 1912  TROPONINI 0.06*   ------------------------------------------------------------------------------------------------------------------  RADIOLOGY:  Dg Chest Portable 1 View  Result Date: 03/01/2018 CLINICAL DATA:  Shortness of breath. EXAM: PORTABLE CHEST 1 VIEW COMPARISON:  01/28/2018 FINDINGS: There is a left chest wall port a catheter with tip at the cavoatrial junction. Heart size appears normal. There is diminished lung volumes identified bilaterally. Extensive scarring and fibrosis is noted bilaterally with asymmetric elevation of the right hemidiaphragm. Loculated right pleural effusion which extends over the right lung apex is stable. New asymmetric increased opacification within the left upper lobe is identified. IMPRESSION: Worsening aeration to the left upper lobe. Findings may reflect superimposed pneumonia or asymmetric edema. Electronically Signed   By: Kerby Moors M.D.   On: 03/01/2018 20:02      IMPRESSION AND PLAN:   Acute on chronic  hypoxic respiratory failure- likely HCAP vs pulmonary edema vs exacerbation of underlying severe radiation fibrosis. BNP 554. CXR with worsening aeration of the left upper lobe. Recent ECHO was normal. -Doing well off BiPAP, continue O2 by [place] -Continue vancomycin and cefepime -Check procalcitonin to differentiate between HCAP and other pulmonary disease -Blood cultures pending -Will give Lasix 40 mg IV x1 -Solumedrol 60 mg IV every 6 hours -Duonebs every 6 hours, albuterol every 2 hours as needed -Continue home pulmicort -Wean O2 as able -Palliative care consult  Elevated troponin- likely demand ischemia in the setting of tachycardia, no active chest pain - trend troponins  Hypothyroidism- recent TSH normal - continue home synthroid  Anxiety- stable - continue home celexa and xanax  Protein-calorie malnutrition- chronic, albumin 3.0 -continue ensure tid -nutrition consult  Non-Hodgkin's lymphoma- in remission  DVT prophylaxis- Lovenox  All the records are reviewed and case discussed with ED provider. Management plans discussed with the patient, family and they are in agreement.  CODE STATUS: Full  TOTAL TIME TAKING CARE OF THIS PATIENT: 35 minutes.    Berna Spare Kaien Pezzullo M.D on 03/01/2018 at 8:59 PM  Between 7am to 6pm - Pager - [phone]  After 6pm go to [email or web address] - Proofreader  [facility]  Hospitalists  Office  [phone]  CC: Primary care physician; Baxter Hire, MD   Note: This dictation was prepared with Dragon dictation along with smaller phrase technology. Any transcriptional errors that result from this process are unintentional.

## 2018-03-02 ENCOUNTER — Inpatient Hospital Stay: Payer: Medicare Other | Attending: Internal Medicine

## 2018-03-02 LAB — CBC
HEMATOCRIT: 32.2 % — AB (ref 35.0–47.0)
Hemoglobin: 11.1 g/dL — ABNORMAL LOW (ref 12.0–16.0)
MCH: 33.7 pg (ref 26.0–34.0)
MCHC: 34.3 g/dL (ref 32.0–36.0)
MCV: 98.1 fL (ref 80.0–100.0)
Platelets: 185 10*3/uL (ref 150–440)
RBC: 3.28 MIL/uL — AB (ref 3.80–5.20)
RDW: 17.7 % — ABNORMAL HIGH (ref 11.5–14.5)
WBC: 3.9 10*3/uL (ref 3.6–11.0)

## 2018-03-02 LAB — URINALYSIS, COMPLETE (UACMP) WITH MICROSCOPIC
Bilirubin Urine: NEGATIVE
Glucose, UA: NEGATIVE mg/dL
Hgb urine dipstick: NEGATIVE
KETONES UR: NEGATIVE mg/dL
Leukocytes, UA: NEGATIVE
Nitrite: NEGATIVE
PROTEIN: NEGATIVE mg/dL
Specific Gravity, Urine: 1.008 (ref 1.005–1.030)
Squamous Epithelial / LPF: NONE SEEN (ref 0–5)
pH: 5 (ref 5.0–8.0)

## 2018-03-02 LAB — STREP PNEUMONIAE URINARY ANTIGEN: Strep Pneumo Urinary Antigen: NEGATIVE

## 2018-03-02 LAB — PROCALCITONIN

## 2018-03-02 LAB — BASIC METABOLIC PANEL
Anion gap: 9 (ref 5–15)
BUN: 26 mg/dL — ABNORMAL HIGH (ref 8–23)
CHLORIDE: 105 mmol/L (ref 98–111)
CO2: 26 mmol/L (ref 22–32)
Calcium: 8.5 mg/dL — ABNORMAL LOW (ref 8.9–10.3)
Creatinine, Ser: 0.98 mg/dL (ref 0.44–1.00)
GFR calc non Af Amer: 55 mL/min — ABNORMAL LOW (ref >60)
GLUCOSE: 150 mg/dL — AB (ref 70–99)
POTASSIUM: 3.8 mmol/L (ref 3.5–5.1)
Sodium: 140 mmol/L (ref 135–145)

## 2018-03-02 LAB — TROPONIN I
Troponin I: 0.05 ng/mL (ref <0.03)
Troponin I: 0.05 ng/mL (ref <0.03)

## 2018-03-02 MED ORDER — POTASSIUM CHLORIDE CRYS ER 20 MEQ PO TBCR
20.0000 meq | EXTENDED_RELEASE_TABLET | Freq: Once | ORAL | Status: AC
  Administered 2018-03-02: 20 meq via ORAL
  Filled 2018-03-02: qty 1

## 2018-03-02 MED ORDER — METHYLPREDNISOLONE SODIUM SUCC 125 MG IJ SOLR
60.0000 mg | Freq: Two times a day (BID) | INTRAMUSCULAR | Status: DC
  Administered 2018-03-02 – 2018-03-03 (×2): 60 mg via INTRAVENOUS
  Filled 2018-03-02 (×2): qty 2

## 2018-03-02 NOTE — Progress Notes (Signed)
UA collected.

## 2018-03-02 NOTE — Progress Notes (Addendum)
Palliative Medicine Consult Order Noted. Due to high referral volume and limited staffing, there will be a delay seeing this patient. Palliative Medicine Provider is expected to see this patient 03/03/18. Please call the Palliative Medicine Team office at [phone] if recommendations are needed in the interim.  Thank you for inviting [place] to see this patient.  Marjie Skiff Kashawn Manzano, RN, BSN, [facility] Palliative Medicine Team 03/02/2018 3:26 PM Office [phone]

## 2018-03-02 NOTE — Progress Notes (Signed)
[facility] at [place] NAME: Anna Mcbride    MR#:  [id number]  DATE OF BIRTH:  [birth year]  SUBJECTIVE:  CHIEF COMPLAINT:   Chief Complaint  Patient presents with  . Respiratory Distress  Patient seen and evaluated today Has shortness of breath No fever No chest pain Oxygen saturation above 90% on nasal cannula  REVIEW OF SYSTEMS:    ROS  CONSTITUTIONAL: No documented fever. No fatigue, weakness. No weight gain, no weight loss.  EYES: No blurry or double vision.  ENT: No tinnitus. No postnasal drip. No redness of the oropharynx.  RESPIRATORY: Has cough, no wheeze, no hemoptysis. Has dyspnea.  CARDIOVASCULAR: No chest pain. No orthopnea. No palpitations. No syncope.  GASTROINTESTINAL: No nausea, no vomiting or diarrhea. No abdominal pain. No melena or hematochezia.  GENITOURINARY: No dysuria or hematuria.  ENDOCRINE: No polyuria or nocturia. No heat or cold intolerance.  HEMATOLOGY: No anemia. No bruising. No bleeding.  INTEGUMENTARY: No rashes. No lesions.  MUSCULOSKELETAL: No arthritis. No swelling. No gout.  NEUROLOGIC: No numbness, tingling, or ataxia. No seizure-type activity.  PSYCHIATRIC: No anxiety. No insomnia. No ADD.   DRUG ALLERGIES:   Allergies  Allergen Reactions  . Hydrocodone-Homatropine Itching  . Tussionex Pennkinetic Er [Hydrocod Polst-Cpm Polst Er] Itching  . Ciprofloxacin Rash    VITALS:  Blood pressure (!) 96/59, pulse 88, temperature (!) 97.5 F (36.4 C), temperature source Oral, resp. rate 18, height 5\' 2"  (1.575 m), weight 52.6 kg, SpO2 99 %.  PHYSICAL EXAMINATION:   Physical Exam  GENERAL:  72 y.o.-year-old patient lying in the bed with no acute distress.  EYES: Pupils equal, round, reactive to light and accommodation. No scleral icterus. Extraocular muscles intact.  HEENT: Head atraumatic, normocephalic. Oropharynx and nasopharynx clear.  NECK:  Supple, no jugular venous distention. No thyroid  enlargement, no tenderness.  LUNGS: Decreased breath sounds bilaterally, left lung rales heard. No use of accessory muscles of respiration.  CARDIOVASCULAR: S1, S2 normal. No murmurs, rubs, or gallops.  ABDOMEN: Soft, nontender, nondistended. Bowel sounds present. No organomegaly or mass.  EXTREMITIES: No cyanosis, clubbing or edema b/l.    NEUROLOGIC: Cranial nerves II through XII are intact. No focal Motor or sensory deficits b/l.   PSYCHIATRIC: The patient is alert and oriented x 3.  SKIN: No obvious rash, lesion, or ulcer.   LABORATORY PANEL:   CBC Recent Labs  Lab 03/02/18 0104  WBC 3.9  HGB 11.1*  HCT 32.2*  PLT 185   ------------------------------------------------------------------------------------------------------------------ Chemistries  Recent Labs  Lab 03/01/18 1912 03/02/18 0104  NA 139 140  K 3.5 3.8  CL 105 105  CO2 29 26  GLUCOSE 105* 150*  BUN 24* 26*  CREATININE 1.04* 0.98  CALCIUM 9.0 8.5*  AST 21  --   ALT 16  --   ALKPHOS 51  --   BILITOT 0.7  --    ------------------------------------------------------------------------------------------------------------------  Cardiac Enzymes Recent Labs  Lab 03/02/18 0712  TROPONINI 0.05*   ------------------------------------------------------------------------------------------------------------------  RADIOLOGY:  Dg Chest Portable 1 View  Result Date: 03/01/2018 CLINICAL DATA:  Shortness of breath. EXAM: PORTABLE CHEST 1 VIEW COMPARISON:  01/28/2018 FINDINGS: There is a left chest wall port a catheter with tip at the cavoatrial junction. Heart size appears normal. There is diminished lung volumes identified bilaterally. Extensive scarring and fibrosis is noted bilaterally with asymmetric elevation of the right hemidiaphragm. Loculated right pleural effusion which extends over the right lung apex is stable. New asymmetric increased  opacification within the left upper lobe is identified. IMPRESSION:  Worsening aeration to the left upper lobe. Findings may reflect superimposed pneumonia or asymmetric edema. Electronically Signed   By: Kerby Moors M.D.   On: 03/01/2018 20:02     ASSESSMENT AND PLAN:  72 year old female patient with history of non-Hodgkin's lymphoma in remission, with history of radiation therapy Currently under hospitalist service for dyspnea  -Healthcare associated pneumonia Continue IV vancomycin and cefepime antibiotics Oxygen via nasal cannula 2 L Follow-up cultures Nebulization treatments and inhaler treatments as needed  -Acute on chronic hypoxic respiratory failure Continue oxygen supplementation IV Solu-Medrol 60 MDQ 6 hourly Nebulization treatments and f/u palliative care consult  -Elevated troponin secondary to demand ischemia No active chest pain and EKG changes  -Protein calorie malnutrition chronic Dietary consult Ensure supplementation  -Non-Hodgkin's lymphoma in remission  -Pulmonary edema Diuresed with IV Lasix Check echocardiogram to assess LV function  All the records are reviewed and case discussed with Care Management/Social Worker. Management plans discussed with the patient, family and they are in agreement.  CODE STATUS: Full code  DVT Prophylaxis: SCDs  TOTAL TIME TAKING CARE OF THIS PATIENT: 35 minutes.   POSSIBLE D/C IN 2 to 3 DAYS, DEPENDING ON CLINICAL CONDITION.  Saundra Shelling M.D on 03/02/2018 at 10:55 AM  Between 7am to 6pm - Pager - [phone]  After 6pm go to [email or web address] - password [email or web address] [place] Hospital[facility]  Office  [phone]  CC: Primary care physician; Baxter Hire, MD  Note: This dictation was prepared with Dragon dictation along with smaller phrase technology. Any transcriptional errors that result from this process are unintentional.

## 2018-03-02 NOTE — Evaluation (Signed)
Occupational Therapy Evaluation Patient Details Name: Anna Mcbride MRN: [id number] DOB: Feb 21, 1946 Today's Date: 03/02/2018    History of Present Illness presented to ER secondary to SOB; admitted with acute on chronic respiratory failure related to HCAP (L), pulmonary edema.  Noted with mild elevation in troponin; likely demand ischemia per physician.   Clinical Impression   Met with pt pleasantly agreeable and motivated to engage with OT, husband at bedside to provide further information. Pt on 3LO2 (was on 3L at home prior to admin). VSS throughout session. Pt reports independence in BADL/IADL prior to May of this year. Post May, pt needing light assist in dressing and IADL (cooking, laundry, grocery shopping) which husband can successfully provide to meet pt need. Pt with rollator, 2WW, transport chair, BSC, and built in shower seat at baseline. Husband planning to install grab bar at doorway to support increased safety when entering/exiting the home. Pt with motivation to return to completed home maintenance activities and desired leisure tasks [employer], has set up in garage and yard work). Bed mobility and functional mobility completed with mod I A, increased time needed. Increased pace of breath noted, educated pt on pursed lip breathing (pt recalled from previous encounters). Energy conservation strategies handout reviewed with pt and husband covering strategies to implement in daily BADL/IADL routines, pt eager to apply strategies to return to desired activities. Pt will benefit from [facility] services to increase functional independence in desired IADL/leisure activities within the home.    Follow Up Recommendations  Home health OT    Equipment Recommendations  None recommended by OT    Recommendations for Other Services       Precautions / Restrictions Precautions Precautions: Fall Restrictions Weight Bearing Restrictions: No      Mobility Bed Mobility Overal bed mobility:  Modified Independent             General bed mobility comments: increased time  Transfers Overall transfer level: Needs assistance Equipment used: None Transfers: Sit to/from Stand Sit to Stand: Supervision         General transfer comment: completed functional mobility with HHA to end of bed, increased breaths noted, O2 sats stable    Balance Overall balance assessment: Needs assistance Sitting-balance support: No upper extremity supported;Feet supported Sitting balance-Leahy Scale: Good     Standing balance support: No upper extremity supported Standing balance-Leahy Scale: Fair                             ADL either performed or assessed with clinical judgement   ADL Overall ADL's : Needs assistance/impaired Eating/Feeding: Set up   Grooming: Set up;Sitting   Upper Body Bathing: Minimal assistance;Sitting   Lower Body Bathing: Minimal assistance;Sit to/from stand   Upper Body Dressing : Set up;Sitting   Lower Body Dressing: Modified independent;Sit to/from stand Lower Body Dressing Details (indicate cue type and reason): pt donned/doffed socks sitting EOB using figure 4 position Toilet Transfer: Min Art therapist Details (indicate cue type and reason): pt reports using BSC with nursing 2/2 decreased activity tolerance           General ADL Comments: majority of tasks needed in sitting to maintain activity tolerance and stable O2 sats     Vision Baseline Vision/History: Wears glasses Wears Glasses: At all times Patient Visual Report: No change from baseline       Perception     Praxis      Pertinent Vitals/Pain Pain  Assessment: No/denies pain     Hand Dominance     Extremity/Trunk Assessment Upper Extremity Assessment Upper Extremity Assessment: Generalized weakness;RUE deficits/detail(grossly 4-/5, grip strength limits ability to engage in pottery ) RUE Deficits / Details: 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 overhead, still functional for majority of tasks   Lower Extremity Assessment Lower Extremity Assessment: Overall WFL for tasks assessed       Communication Communication Communication: No difficulties   Cognition Arousal/Alertness: Awake/alert Behavior During Therapy: WFL for tasks assessed/performed Overall Cognitive Status: Within Functional Limits for tasks assessed                                     General Comments  flaky skin and hematoma noted at baseline on BUEs    Exercises     Shoulder Instructions      Home Living Family/patient expects to be discharged to:: Private residence Living Arrangements: Spouse/significant other Available Help at Discharge: Family Type of Home: House Home Access: Stairs to enter Technical brewer of Steps: 2 Entrance Stairs-Rails: None Home Layout: One level     Bathroom Shower/Tub: Chief Strategy Officer: Shower seat - built in;Grab bars - tub/shower;Walker - 4 wheels;Transport chair;Bedside commode   Additional Comments: Single step into living, pt reports rarely using this space      Prior Functioning/Environment Level of Independence: Independent        Comments: Pt reports independence prior to May, as of recent pt needing assist with dressing and IADLs (groceries, household management), active with leisure activities        OT Problem List: Decreased strength;Impaired balance (sitting and/or standing);Cardiopulmonary status limiting activity;Decreased activity tolerance      OT Treatment/Interventions: Self-care/ADL training;Energy conservation;Patient/family education    OT Goals(Current goals can be found in the care plan section) Acute Rehab OT Goals Patient Stated Goal: to return to cooking, pottery, and laundry OT Goal Formulation: With patient/family Time For Goal Achievement: 03/16/18 Potential to Achieve Goals: Good  OT Frequency: Min  2X/week   Barriers to D/C:            Co-evaluation              AM-PAC PT "6 Clicks" Daily Activity     Outcome Measure Help from another person eating meals?: A Little Help from another person taking care of personal grooming?: A Little Help from another person toileting, which includes using toliet, bedpan, or urinal?: A Little Help from another person bathing (including washing, rinsing, drying)?: A Little Help from another person to put on and taking off regular upper body clothing?: A Little Help from another person to put on and taking off regular lower body clothing?: A Little 6 Click Score: 18   End of Session Equipment Utilized During Treatment: Gait belt;Oxygen  Activity Tolerance: Patient tolerated treatment well Patient left: in bed;with call bell/phone within reach;with bed alarm set;with family/visitor present  OT Visit Diagnosis: Muscle weakness (generalized) (M62.81);Other abnormalities of gait and mobility (R26.89)                Time: 9242-6834 OT Time Calculation (min): 38 min Charges:  OT General Charges $OT Visit: 1 Visit OT Evaluation $OT Eval Moderate Complexity: 1 Mod OT Treatments $Self Care/Home Management : 23-37 mins  Zenovia Jarred, MSOT, OTR/L  [place] 03/02/2018, 5:44 PM

## 2018-03-02 NOTE — Evaluation (Signed)
Physical Therapy Evaluation Patient Details Name: Anna Mcbride MRN: [id number] DOB: March 02, 1946 Today's Date: 03/02/2018   History of Present Illness  presented to ER secondary to SOB; admitted with acute on chronic respiratory failure related to HCAP (L), pulmonary edema.  Noted with mild elevation in troponin; likely demand ischemia per physician.  Clinical Impression  Upon evaluation, patient alert and oriented; follows all commands.  Eager for OOB activity as tolerated.  Patient generally weak and deconditioned due to chronic health conditions (and recent previous hospitalizations), but demonstrates strength and ROM grossly adequate for basic transfers/mobility.  Denies pain.  Demonstrates ability to complete bed mobility with mod indep; sit/stand, basic transfers and gait (30') with RW, cga/close sup.  Decreased cadence/gait speed, but no overt buckling or LOB.  Significant SOB/DOE with minimal gait distance and activity, requiring seated rest period and extended recovery time for return to baseline.  Sats, however, remain >92% on 3L supplemental O2 throughout session.  Will continue to emphasize activity pacing/energy conservation in subsequent sessions (OT consult placed to address as well). Would benefit from skilled PT to address above deficits and promote optimal return to PLOF; Recommend transition to [place] upon discharge from acute hospitalization.     Follow Up Recommendations Home health PT    Equipment Recommendations  Rolling walker with 5" wheels    Recommendations for Other Services       Precautions / Restrictions Precautions Precautions: Fall Restrictions Weight Bearing Restrictions: No      Mobility  Bed Mobility Overal bed mobility: Modified Independent                Transfers Overall transfer level: Needs assistance Equipment used: Rolling walker (2 wheeled) Transfers: Sit to/from Stand Sit to Stand: Supervision         General transfer comment:  requires UE support to complete movement transition  Ambulation/Gait Ambulation/Gait assistance: Min guard Gait Distance (Feet): 30 Feet Assistive device: Rolling walker (2 wheeled)       General Gait Details: mild forward trunk flexion; reciprocal stepping pattern with slow, but fairly steady, cadence.  No buckling or LOB; however, significant dyspnea on exertion, requiring seated rest period after 30' of gait distance (sats remain >92% on 3L)  Stairs            Wheelchair Mobility    Modified Rankin (Stroke Patients Only)       Balance Overall balance assessment: Needs assistance Sitting-balance support: No upper extremity supported;Feet supported Sitting balance-Leahy Scale: Good     Standing balance support: Bilateral upper extremity supported Standing balance-Leahy Scale: Fair                               Pertinent Vitals/Pain Pain Assessment: No/denies pain    Home Living Family/patient expects to be discharged to:: Private residence Living Arrangements: Spouse/significant other Available Help at Discharge: Family Type of Home: House Home Access: Stairs to enter Entrance Stairs-Rails: None Entrance Stairs-Number of Steps: 2 Home Layout: One level Home Equipment: Cane - single point;Shower seat - built in;Grab bars - tub/shower Additional Comments: Does have single step to access living and dining room of home environment; does not typically utilize (able to access kitchen, sunroom, bed and bathroom on primary level)    Prior Function Level of Independence: Independent         Comments: Indep with ADLs, household and limited community distances; home O2 at 3L (recently increased from 2L); does  endorse at least 1-2 falls within previous six months. Very active with church, book club, friends, etc.     Journalist, newspaper        Extremity/Trunk Assessment   Upper Extremity Assessment Upper Extremity Assessment: Overall WFL for tasks  assessed    Lower Extremity Assessment Lower Extremity Assessment: Overall WFL for tasks assessed(grossly at least 4-/5 throughout)       Communication   Communication: No difficulties  Cognition Arousal/Alertness: Awake/alert Behavior During Therapy: WFL for tasks assessed/performed Overall Cognitive Status: Within Functional Limits for tasks assessed                                        General Comments      Exercises     Assessment/Plan    PT Assessment Patient needs continued PT services  PT Problem List Decreased strength;Decreased range of motion;Decreased activity tolerance;Decreased balance;Decreased mobility;Decreased knowledge of use of DME;Decreased safety awareness;Decreased knowledge of precautions;Cardiopulmonary status limiting activity       PT Treatment Interventions DME instruction;Gait training;Stair training;Functional mobility training;Therapeutic activities;Therapeutic exercise;Balance training;Patient/family education    PT Goals (Current goals can be found in the Care Plan section)  Acute Rehab PT Goals Patient Stated Goal: to get out of bed and be able to use the [facility] PT Goal Formulation: With patient Time For Goal Achievement: 03/16/18 Potential to Achieve Goals: Fair    Frequency Min 2X/week   Barriers to discharge        Co-evaluation               AM-PAC PT "6 Clicks" Daily Activity  Outcome Measure Difficulty turning over in bed (including adjusting bedclothes, sheets and blankets)?: None Difficulty moving from lying on back to sitting on the side of the bed? : None Difficulty sitting down on and standing up from a chair with arms (e.g., wheelchair, bedside commode, etc,.)?: Unable Help needed moving to and from a bed to chair (including a wheelchair)?: A Little Help needed walking in hospital room?: A Little Help needed climbing 3-5 steps with a railing? : A Little 6 Click Score: 18    End of Session  Equipment Utilized During Treatment: Gait belt;Oxygen Activity Tolerance: Patient tolerated treatment well Patient left: in chair;with call bell/phone within reach;with chair alarm set;with family/visitor present Nurse Communication: Mobility status PT Visit Diagnosis: Muscle weakness (generalized) (M62.81);Difficulty in walking, not elsewhere classified (R26.2)    Time: 2751-7001 PT Time Calculation (min) (ACUTE ONLY): 32 min   Charges:   PT Evaluation $PT Eval Moderate Complexity: 1 Mod PT Treatments $Gait Training: 8-22 mins        Nickoli Bagheri H. Owens Shark, PT, DPT, NCS 03/02/18, 12:28 PM [phone]

## 2018-03-02 NOTE — Progress Notes (Signed)
Initial Nutrition Assessment  DOCUMENTATION CODES:   Non-severe (moderate) malnutrition in context of chronic illness  INTERVENTION:  Recommend liberalizing diet to regular.  Continue Ensure Enlive po TID, each supplement provides 350 kcal and 20 grams of protein.  Provide daily MVI.  Encouraged adequate intake of calories and protein at meals. Discussed calorie- and protein-dense foods to include at meals and snacks.  NUTRITION DIAGNOSIS:   Moderate Malnutrition related to chronic illness(hx non-Hodgkin's lymphoma, fibrosis) as evidenced by moderate fat depletion, moderate muscle depletion.  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   Met with patient and her husband at bedside. They are known to this RD from an admission in June 2019. Patient reports her appetite remains decreased but they have been working hard to gain her weight back. Family members have been bringing cooked food over and her husband continues to cook as well as pt can no longer cook. She is still attempting to eat 3 meals per day. She now has also added in two bottles of Ensure daily.  UBW had been around 122 lbs (55.5 kg). Patient was 55.7 kg on 06/28/2017. She is currently 52.6 kg. She was 51.3 kg in 12/2017 so her weight is starting to slowly trend up.  Medications reviewed and include: levothyroxine, MVI, Solu-Medrol 60 mg Q12hr sIV, pantoprazole, cefepime.  Labs reviewed:  BUN 26.  NUTRITION - FOCUSED PHYSICAL EXAM:    Most Recent Value  Orbital Region  Moderate depletion  Upper Arm Region  Moderate depletion  Thoracic and Lumbar Region  Mild depletion  Buccal Region  Moderate depletion  Temple Region  Severe depletion  Clavicle Bone Region  Moderate depletion  Clavicle and Acromion Bone Region  Moderate depletion  Scapular Bone Region  Moderate depletion  Dorsal Hand  Severe depletion  Patellar Region  Moderate depletion  Anterior Thigh Region  Moderate depletion  Posterior Calf Region  Moderate depletion  Edema (RD Assessment)  None  Hair  Reviewed  Eyes  Reviewed  Mouth  Reviewed  Skin  Reviewed  Nails  Reviewed     Diet Order:   Diet Order            Diet regular Room service appropriate? Yes; Fluid consistency: Thin  Diet effective now              EDUCATION NEEDS:   Education needs have been addressed  Skin:  Skin Assessment: Reviewed RN Assessment  Last BM:  Unknown/PTA  Height:   Ht Readings from Last 1 Encounters:  03/01/18 _0  (1.575 m)    Weight:   Wt Readings from Last 1 Encounters:  03/01/18 52.6 kg    Ideal Body Weight:  50 kg  BMI:  Body mass index is 21.21 kg/m.  Estimated Nutritional Needs:   Kcal:  1315-1580 (25-30 kcal/kg)  Protein:  70-80 grams (1.3-1.5 grams/kg)  Fluid:  1.3-1.6 L/day (1 mL/kcal)  Willey Blade, MS, RD, LDN Office: [phone] Pager: [phone] After Hours/Weekend Pager: [phone]

## 2018-03-03 ENCOUNTER — Inpatient Hospital Stay
Admit: 2018-03-03 | Discharge: 2018-03-03 | Disposition: A | Discharge status: Home / Self Care | Payer: Medicare Other | Attending: Internal Medicine | Admitting: Internal Medicine

## 2018-03-03 DIAGNOSIS — E785 Hyperlipidemia, unspecified: Principal | ICD-10-CM

## 2018-03-03 DIAGNOSIS — E781 Pure hyperglyceridemia: Secondary | ICD-10-CM

## 2018-03-03 DIAGNOSIS — I10 Essential (primary) hypertension: Secondary | ICD-10-CM

## 2018-03-03 LAB — ECHOCARDIOGRAM COMPLETE
HEIGHTINCHES: 62 in
WEIGHTICAEL: 1855.39 [oz_av]

## 2018-03-03 LAB — PROCALCITONIN: Procalcitonin: <0.1 ng/mL

## 2018-03-03 LAB — HIV ANTIBODY (ROUTINE TESTING W REFLEX): HIV Screen 4th Generation wRfx: NONREACTIVE

## 2018-03-03 MED ORDER — FENOFIBRATE 145 MG PO TABS
145 mg | Freq: Every day | ORAL | 3 refills | Status: CP
Start: 2018-03-03 — End: ?

## 2018-03-03 MED ORDER — PRAVASTATIN SODIUM 40 MG PO TABS
0 refills | Status: CP
Start: 2018-03-03 — End: ?

## 2018-03-03 MED ORDER — LISINOPRIL 10 MG PO TABS
0 refills | Status: CP
Start: 2018-03-03 — End: ?

## 2018-03-03 MED ORDER — AMOXICILLIN-POT CLAVULANATE 875-125 MG PO TABS: 1.0000 | ORAL_TABLET | Freq: Two times a day (BID) | ORAL | 0 refills | Status: AC

## 2018-03-03 MED ORDER — HEPARIN SOD (PORK) LOCK FLUSH 100 UNIT/ML IV SOLN
500.0000 [IU] | INTRAVENOUS | Status: AC | PRN
  Administered 2018-03-03: 500 [IU]

## 2018-03-03 MED ORDER — SODIUM CHLORIDE 0.9% FLUSH: 10.0000 mL | INTRAVENOUS | Status: DC | PRN

## 2018-03-03 NOTE — Progress Notes (Signed)
*  PRELIMINARY RESULTS* Echocardiogram 2D Echocardiogram has been performed.  Anna Mcbride 03/03/2018, 10:51 AM

## 2018-03-03 NOTE — Care Management Note (Signed)
Case Management Note  Patient Details  Name: Anna Mcbride MRN: [id number] Date of Birth: 16-Dec-1945  Subjective/Objective:  Spoke with patient regarding discharge planning. She lives with her spouse who is her care giver. She uses 3L O2 chronically through [facility]. Admitted with respiratory failure. Discharging today. Will need home health. Offered a list of home care agencies. Referral to [facility] for RN, PT and OT. Patient has a walker. No DME needs. PCP is  Dr. Edwina Barth. Last seen 02/25/18.                     Action/Plan: [facility] for home health RN, PT and OT.   Expected Discharge Date:  03/03/18               Expected Discharge Plan:  [place]  In-House Referral:     Discharge planning Services  CM Consult  Post Acute Care Choice:  Home Health Choice offered to:  Patient  DME Arranged:    DME Agency:     HH Arranged:  RN, PT, OT [place] Agency:  [facility]  Status of Service:  Completed, signed off  If discussed at [employer], dates discussed:    Additional Comments:  Jolly Mango, RN 03/03/2018, 1:58 PM

## 2018-03-03 NOTE — Discharge Summary (Addendum)
[facility] [place] NAME: Anna Mcbride    MR#:  [id number]  DATE OF BIRTH:  1946/05/23  DATE OF ADMISSION:  03/01/2018 ADMITTING PHYSICIAN: Sela Hua, MD  DATE OF DISCHARGE: 03/03/2018  PRIMARY CARE PHYSICIAN: Baxter Hire, MD   ADMISSION DIAGNOSIS:  Acute respiratory failure with hypoxia ([place]) [J96.01] HCAP (healthcare-associated pneumonia) [J18.9] Elevated troponin Protein calorie malnutrition chronic Acute pulmonary edema Non-Hodgkin's lymphoma DISCHARGE DIAGNOSIS:  Active Problems:   Acute respiratory failure with hypoxia ([place]) Healthcare associated pneumonia Elevated troponin secondary to demand ischemia Acute pulmonary edema Non-Hodgkin's lymphoma Protein calorie malnutrition  SECONDARY DIAGNOSIS:   Past Medical History:  Diagnosis Date  . Acquired hypothyroidism 07/01/2013  . Acute respiratory failure ([place]) 11/29/2014  . 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 Asthma   . Back pain with sciatica 05/07/2017  . Biliary sludge 12/19/2017  . Lymphoma, non-Hodgkin's ([place]) 06/30/2013  . Non Hodgkin's lymphoma ([place]) [id number]  . Personal history of chemotherapy   . Recurrent upper respiratory infection (URI) 09/01/2015  . 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  She has been checking her pulse ox at home and her O2 saturations have been in the 70s.  She also notes low-grade temperatures to 100.3 and dry cough.  She has been seeing her primary care doctor as an outpatient for shortness of breath and fever.  She has been on a recent course of antibiotics and Lasix, as well as steroid burst.  Her PCP recently started her on prednisone 10 mg  daily for maintenance therapy.  Of note she was recently hospitalized from 6/14-6/20 for shortness of breath that was thought to be secondary to her underlying lung disease. In the ED, she initially required BiPAP.  She was transitioned to 5 L of oxygen by nasal cannula without any issues.  Labs are significant for BNP 554 and troponin 0.06.  Chest x-ray showed worsening aeration of the left upper lobe possibly pneumonia or asymmetric edema.  She was started on vancomycin and cefepime for HCAP and hospitalist was called for admission.  HOSPITAL COURSE:  Patient was admitted to medical floor.  She continued oxygen via nasal cannula.  Initially patient was started on IV vancomycin and cefepime antibiotics.  Patient tolerated IV antibiotics well.  Blood cultures did not reveal any growth.  She received a dose of Lasix IV for diuresis for pulmonary edema.  She was evaluated with echocardiogram during hospitalization. During Hospitalization patient had elevated troponin secondary to demand ischemia.  IV Solu-Medrol and nebulization treatments in the hospital. Patient's hypoxemia improved.  Patient will be discharged home on oral Augmentin and follow-up with primary care physician in the clinic.  She was evaluated by PT , OT.  Patient will be discharged to home with home health services.  CONSULTS OBTAINED:  None  DRUG ALLERGIES:   Allergies  Allergen Reactions  . Hydrocodone-Homatropine Itching  . Tussionex Pennkinetic Er [Hydrocod Polst-Cpm Polst Er] Itching  . Ciprofloxacin Rash    DISCHARGE MEDICATIONS:   Allergies as of 03/03/2018      Reactions   Hydrocodone-homatropine Itching   Tussionex Pennkinetic Er [hydrocod Polst-cpm Polst Er] Itching   Ciprofloxacin Rash  Medication List    TAKE these medications   acetaminophen 325 MG tablet Commonly known as:  TYLENOL Take 2 tablets (650 mg total) by mouth every 6 (six) hours as needed for mild pain (headache).   albuterol 108 (90 Base)  MCG/ACT inhaler Commonly known as:  PROVENTIL HFA;VENTOLIN HFA Inhale 1 puff into the lungs every 6 (six) hours as needed for wheezing or shortness of breath.   albuterol (2.5 MG/3ML) 0.083% nebulizer solution Commonly known as:  PROVENTIL Take 3 mLs (2.5 mg total) by nebulization every 4 (four) hours as needed for wheezing or shortness of breath.   ALPRAZolam 0.5 MG tablet Commonly known as:  XANAX Take 1 tablet (0.5 mg total) by mouth at bedtime.   amoxicillin-clavulanate 875-125 MG tablet Commonly known as:  AUGMENTIN Take 1 tablet by mouth 2 (two) times daily for 5 days.   aspirin EC 81 MG tablet Take 81 mg by mouth daily.   budesonide 0.5 MG/2ML nebulizer solution Commonly known as:  PULMICORT Take 2 mLs (0.5 mg total) by nebulization 2 (two) times daily. J96.01   citalopram 20 MG tablet Commonly known as:  CELEXA TAKE 1 TABLET BY MOUTH ONCE DAILY   feeding supplement (ENSURE ENLIVE) Liqd Take 237 mLs by mouth 3 (three) times daily between meals.   FLUTTER Devi Use 10-15 times daily   furosemide 20 MG tablet Commonly known as:  LASIX Take 1 tablet (20 mg total) by mouth daily.   ipratropium-albuterol 0.5-2.5 (3) MG/3ML Soln Commonly known as:  DUONEB Take 3 mLs by nebulization every 6 (six) hours.   levothyroxine 25 MCG tablet Commonly known as:  SYNTHROID, LEVOTHROID TAKE 1 TABLET BY MOUTH ONCE DAILY ON AN EMPTY STOMACH. WAIT 30 MINUTES BEFORE TAKING OTHER MEDS.   multivitamin with minerals Tabs tablet Take 1 tablet by mouth daily.   omeprazole 20 MG capsule Commonly known as:  PRILOSEC TAKE 1 CAPSULE BY MOUTH TWICE DAILY BEFORE MEALS   ondansetron 4 MG disintegrating tablet Commonly known as:  ZOFRAN-ODT Take 1 tablet (4 mg total) by mouth every 8 (eight) hours as needed for nausea or vomiting.   polyethylene glycol packet Commonly known as:  MIRALAX / GLYCOLAX Take 17 g by mouth daily.   potassium chloride SA 20 MEQ tablet Commonly known as:   K-DUR,KLOR-CON Take 1 tablet (20 mEq total) by mouth daily.   predniSONE 10 MG tablet Commonly known as:  DELTASONE Take 1 tablet by mouth daily.   traMADol 50 MG tablet Commonly known as:  ULTRAM Take 1 tablet (50 mg total) by mouth every 6 (six) hours as needed for moderate pain.   vitamin C 500 MG tablet Commonly known as:  ASCORBIC ACID Take 500 mg by mouth daily.       Today  Patient seen on the day of discharge Comfortable on oxygen via nasal cannula No chest pain No fever No shortness of breath  VITAL SIGNS:  Blood pressure (!) 93/57, pulse (!) 102, temperature 98 F (36.7 C), temperature source Oral, resp. rate 18, height 5\' 2"  (1.575 m), weight 52.6 kg, SpO2 99 %.  I/O:    Intake/Output Summary (Last 24 hours) at 03/03/2018 1045 Last data filed at 03/03/2018 0900 Gross per 24 hour  Intake 480 ml  Output 200 ml  Net 280 ml    PHYSICAL EXAMINATION:  Physical Exam  GENERAL:  72 y.o.-year-old patient lying in the bed with no acute distress.  LUNGS: Normal breath sounds bilaterally, no wheezing, rales,rhonchi or crepitation.  No use of accessory muscles of respiration.  CARDIOVASCULAR: S1, S2 normal. No murmurs, rubs, or gallops.  ABDOMEN: Soft, non-tender, non-distended. Bowel sounds present. No organomegaly or mass.  NEUROLOGIC: Moves all 4 extremities. PSYCHIATRIC: The patient is alert and oriented x 3.  SKIN: No obvious rash, lesion, or ulcer.   DATA REVIEW:   CBC Recent Labs  Lab 03/02/18 0104  WBC 3.9  HGB 11.1*  HCT 32.2*  PLT 185    Chemistries  Recent Labs  Lab 03/01/18 1912 03/02/18 0104  NA 139 140  K 3.5 3.8  CL 105 105  CO2 29 26  GLUCOSE 105* 150*  BUN 24* 26*  CREATININE 1.04* 0.98  CALCIUM 9.0 8.5*  AST 21  --   ALT 16  --   ALKPHOS 51  --   BILITOT 0.7  --     Cardiac Enzymes Recent Labs  Lab 03/02/18 0712  TROPONINI 0.05*    Microbiology Results  Results for orders placed or performed during the hospital  encounter of 03/01/18  Culture, blood (routine x 2) Call MD if unable to obtain prior to antibiotics being given     Status: None (Preliminary result)   Collection Time: 03/01/18 11:05 PM  Result Value Ref Range Status   Specimen Description BLOOD LEFT HAND  Final   Special Requests   Final    BOTTLES DRAWN AEROBIC AND ANAEROBIC Blood Culture adequate volume   Culture   Final    NO GROWTH 2 DAYS Performed at [facility], [address] [place], [place] [address]    Report Status PENDING  Incomplete  Culture, blood (routine x 2) Call MD if unable to obtain prior to antibiotics being given     Status: None (Preliminary result)   Collection Time: 03/01/18 11:15 PM  Result Value Ref Range Status   Specimen Description BLOOD RIGHT HAND  Final   Special Requests   Final    BOTTLES DRAWN AEROBIC AND ANAEROBIC Blood Culture adequate volume   Culture   Final    NO GROWTH 2 DAYS Performed at [facility], [address] [place], [place] [address]    Report Status PENDING  Incomplete    RADIOLOGY:  Dg Chest Portable 1 View  Result Date: 03/01/2018 CLINICAL DATA:  Shortness of breath. EXAM: PORTABLE CHEST 1 VIEW COMPARISON:  01/28/2018 FINDINGS: There is a left chest wall port a catheter with tip at the cavoatrial junction. Heart size appears normal. There is diminished lung volumes identified bilaterally. Extensive scarring and fibrosis is noted bilaterally with asymmetric elevation of the right hemidiaphragm. Loculated right pleural effusion which extends over the right lung apex is stable. New asymmetric increased opacification within the left upper lobe is identified. IMPRESSION: Worsening aeration to the left upper lobe. Findings may reflect superimposed pneumonia or asymmetric edema. Electronically Signed   By: Kerby Moors M.D.   On: 03/01/2018 20:02    Follow up with PCP in 1 week.  Management plans discussed with the patient, family and they are in  agreement.  CODE STATUS: Full code    Code Status Orders  (From admission, onward)         Start     Ordered   03/01/18 2246  Full code  Continuous     03/01/18 2245        Code Status History    Date Active Date Inactive Code Status Order ID Comments User Context   01/02/2018 1206 01/08/2018 2006 Full Code  [id number]  [name], MD Inpatient   12/20/2017 0128 12/21/2017 1718 Full Code [id number]  Lance Coon, MD Inpatient   05/29/2017 1740 05/30/2017 1649 Full Code [id number]  Henreitta Leber, MD Inpatient   11/26/2016 1844 11/29/2016 1513 Full Code [id number]  Henreitta Leber, MD Inpatient   09/12/2016 0223 09/13/2016 1756 DNR [id number]  Harrie Foreman, MD ED   06/18/2016 1821 06/20/2016 1535 DNR [id number]  Vaughan Basta, MD Inpatient   11/29/2014 1405 12/05/2014 1550 DNR [id number]  Evlyn Kanner, NP Inpatient    Advance Directive Documentation     Most Recent Value  Type of Advance Directive  Living will  Pre-existing out of facility DNR order (yellow form or pink MOST form)  -  "MOST" Form in Place?  -      TOTAL TIME TAKING CARE OF THIS PATIENT ON DAY OF DISCHARGE: more than 34 minutes.   Saundra Shelling M.D on 03/03/2018 at 10:45 AM  Between 7am to 6pm - Pager - [phone]  After 6pm go to [email or web address] - password [email or web address] [facility]  Office  [phone]  CC: Primary care physician; Baxter Hire, MD  Note: This dictation was prepared with Dragon dictation along with smaller phrase technology. Any transcriptional errors that result from this process are unintentional.

## 2018-03-03 NOTE — Progress Notes (Signed)
Pt discharged to home via wheelchair without incident per MD order. Prior to discharge, all discharge teachings done both written and verbal with patient and husband. All questions answered. Pt and husband agree to comply to all teachings. No change in patient from AM assessment. Pt pain controlled on discharge. Pt discharged with prescription.

## 2018-03-04 ENCOUNTER — Ambulatory Visit: Payer: Medicare Other | Admitting: Internal Medicine

## 2018-03-04 ENCOUNTER — Encounter: Payer: Self-pay | Admitting: Internal Medicine

## 2018-03-04 ENCOUNTER — Telehealth: Payer: Self-pay | Admitting: *Deleted

## 2018-03-04 VITALS — BP 102/84 | HR 108 | Ht 62.0 in | Wt 112.0 lb

## 2018-03-04 DIAGNOSIS — I509 Heart failure, unspecified: Secondary | ICD-10-CM | POA: Diagnosis not present

## 2018-03-04 DIAGNOSIS — J9611 Chronic respiratory failure with hypoxia: Secondary | ICD-10-CM

## 2018-03-04 MED ORDER — FUROSEMIDE 20 MG PO TABS: 20.0000 mg | ORAL_TABLET | Freq: Every day | ORAL | 1 refills | Status: DC

## 2018-03-04 MED ORDER — FORMOTEROL FUMARATE 20 MCG/2ML IN NEBU: 20.0000 ug | INHALATION_SOLUTION | Freq: Two times a day (BID) | RESPIRATORY_TRACT | 11 refills | Status: DC

## 2018-03-04 NOTE — Telephone Encounter (Signed)
Per Dr B, he is not comfortable managing her COPD, this is not related to her cancer. He does not want to serve as attending. Denise informed

## 2018-03-04 NOTE — Telephone Encounter (Signed)
Hospice called and repots that Pulmonoilgist referred patient to Hospice but will not serve as her hospice doctor. Hospice asking if Dr B will sign orders and be her provider.

## 2018-03-04 NOTE — Patient Instructions (Signed)
Restart PEFOROMIST Continue PULMICORT  CARDIOLOGY REFERRAL CONTINUE PREDNISONE 20 mg daily Continue Oxygen therapy   Xanax 0.5 mg three times per day as needed

## 2018-03-04 NOTE — Addendum Note (Signed)
Addended by: Oscar La R on: 03/04/2018 02:03 PM   Modules accepted: Orders
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
## 2018-03-06 LAB — CULTURE, BLOOD (ROUTINE X 2)
CULTURE: NO GROWTH
Culture: NO GROWTH
SPECIAL REQUESTS: ADEQUATE
Special Requests: ADEQUATE

## 2018-03-10 ENCOUNTER — Telehealth: Payer: Self-pay | Admitting: Internal Medicine

## 2018-03-10 NOTE — Telephone Encounter (Signed)
See message below °

## 2018-03-10 NOTE — Telephone Encounter (Signed)
States they received a order and when they arrived to pt home, pt family refused services. Will continue to follow for palliative care

## 2018-03-17 DIAGNOSIS — J449 Chronic obstructive pulmonary disease, unspecified: Principal | ICD-10-CM

## 2018-03-17 DIAGNOSIS — J3089 Other allergic rhinitis: Secondary | ICD-10-CM

## 2018-03-17 MED ORDER — ALBUTEROL SULFATE HFA 108 (90 BASE) MCG/ACT IN AERS
1 refills | Status: CP
Start: 2018-03-17 — End: 2018-05-05

## 2018-03-17 MED ORDER — FLUTICASONE PROPIONATE 50 MCG/ACT NA SUSP
1 refills | Status: CP
Start: 2018-03-17 — End: 2018-05-19

## 2018-04-02 ENCOUNTER — Ambulatory Visit: Attending: Internal Medicine | Primary: Internal Medicine

## 2018-04-02 DIAGNOSIS — I10 Essential (primary) hypertension: Principal | ICD-10-CM

## 2018-04-02 DIAGNOSIS — N181 Chronic kidney disease, stage 1: Secondary | ICD-10-CM

## 2018-04-02 DIAGNOSIS — Z794 Long term (current) use of insulin: Secondary | ICD-10-CM

## 2018-04-02 DIAGNOSIS — E785 Hyperlipidemia, unspecified: Secondary | ICD-10-CM

## 2018-04-02 DIAGNOSIS — E1122 Type 2 diabetes mellitus with diabetic chronic kidney disease: Secondary | ICD-10-CM

## 2018-04-03 ENCOUNTER — Ambulatory Visit: Payer: Medicare Other | Admitting: Internal Medicine

## 2018-04-13 ENCOUNTER — Ambulatory Visit: Attending: Internal Medicine | Primary: Internal Medicine

## 2018-04-13 DIAGNOSIS — F329 Major depressive disorder, single episode, unspecified: Secondary | ICD-10-CM

## 2018-04-13 DIAGNOSIS — M791 Myalgia, unspecified site: Principal | ICD-10-CM

## 2018-04-13 DIAGNOSIS — Z6841 Body Mass Index (BMI) 40.0 and over, adult: Principal | ICD-10-CM

## 2018-04-13 DIAGNOSIS — E1149 Type 2 diabetes mellitus with other diabetic neurological complication: Secondary | ICD-10-CM

## 2018-04-13 DIAGNOSIS — I10 Essential (primary) hypertension: Secondary | ICD-10-CM

## 2018-04-13 DIAGNOSIS — E785 Hyperlipidemia, unspecified: Secondary | ICD-10-CM

## 2018-04-13 DIAGNOSIS — E1140 Type 2 diabetes mellitus with diabetic neuropathy, unspecified: Secondary | ICD-10-CM

## 2018-04-13 DIAGNOSIS — E119 Type 2 diabetes mellitus without complications: Principal | ICD-10-CM

## 2018-04-13 DIAGNOSIS — K219 Gastro-esophageal reflux disease without esophagitis: Secondary | ICD-10-CM

## 2018-04-13 DIAGNOSIS — M159 Polyosteoarthritis, unspecified: Secondary | ICD-10-CM

## 2018-04-13 DIAGNOSIS — F419 Anxiety disorder, unspecified: Secondary | ICD-10-CM

## 2018-04-13 DIAGNOSIS — M1990 Unspecified osteoarthritis, unspecified site: Secondary | ICD-10-CM

## 2018-04-13 MED ORDER — TIZANIDINE HCL 2 MG PO TABS
3 refills | Status: CP
Start: 2018-04-13 — End: ?

## 2018-04-13 MED ORDER — ACETAMINOPHEN-CODEINE #3 300-30 MG PO TABS
1 | ORAL_TABLET | Freq: Three times a day (TID) | ORAL | 0 refills | Status: CP | PRN
Start: 2018-04-13 — End: ?

## 2018-04-22 ENCOUNTER — Telehealth: Payer: Self-pay | Admitting: *Deleted

## 2018-04-22 NOTE — Telephone Encounter (Signed)
Per Dr. Rogue Bussing - Verbal order can be given cbc, metb and port flush - fax results to cancer center

## 2018-04-22 NOTE — Telephone Encounter (Signed)
Hospice called asking for orders to flush patient port, and also lab orders as patient does not want to come to office to have them drawn on the 14th. Her lab an dfu appointment need to be cancelled

## 2018-04-22 NOTE — Telephone Encounter (Signed)
Call returned to [facility] and VO for port flushes and labs verbally given to her

## 2018-05-04 ENCOUNTER — Other Ambulatory Visit: Payer: Medicare Other

## 2018-05-04 ENCOUNTER — Ambulatory Visit: Payer: Medicare Other | Admitting: Internal Medicine

## 2018-05-04 DIAGNOSIS — E1121 Type 2 diabetes mellitus with diabetic nephropathy: Secondary | ICD-10-CM

## 2018-05-04 DIAGNOSIS — J449 Chronic obstructive pulmonary disease, unspecified: Principal | ICD-10-CM

## 2018-05-04 MED ORDER — BD INSULIN SYRINGE U/F 31G X 5/16" 1 ML MISC
INJECTION | 11 refills | Status: CP
Start: 2018-05-04 — End: ?

## 2018-05-04 MED ORDER — GABAPENTIN 400 MG PO CAPS
0 refills | Status: CP
Start: 2018-05-04 — End: ?

## 2018-05-04 MED ORDER — ALBUTEROL SULFATE HFA 108 (90 BASE) MCG/ACT IN AERS
1 refills | Status: CP
Start: 2018-05-04 — End: ?

## 2018-05-19 DIAGNOSIS — J3089 Other allergic rhinitis: Principal | ICD-10-CM

## 2018-05-19 MED ORDER — FLUTICASONE PROPIONATE 50 MCG/ACT NA SUSP
1 refills | Status: CP
Start: 2018-05-19 — End: ?

## 2018-05-27 ENCOUNTER — Telehealth: Payer: Self-pay | Admitting: Internal Medicine

## 2018-05-27 NOTE — Telephone Encounter (Signed)
Patient declined  to schedule, currently  in hospice deleting recall.

## 2018-07-05 ENCOUNTER — Other Ambulatory Visit: Payer: Self-pay | Admitting: Internal Medicine

## 2018-07-05 DIAGNOSIS — C8590 Non-Hodgkin lymphoma, unspecified, unspecified site: Secondary | ICD-10-CM

## 2018-07-06 ENCOUNTER — Other Ambulatory Visit: Payer: Self-pay | Admitting: Physician Assistant

## 2018-07-06 DIAGNOSIS — H9203 Otalgia, bilateral: Secondary | ICD-10-CM

## 2018-07-06 DIAGNOSIS — M542 Cervicalgia: Secondary | ICD-10-CM

## 2018-07-08 ENCOUNTER — Ambulatory Visit
Admission: RE | Admit: 2018-07-08 | Discharge: 2018-07-08 | Disposition: A | Discharge status: Home / Self Care | Payer: Medicare Other | Source: Ambulatory Visit | Attending: Physician Assistant | Admitting: Physician Assistant

## 2018-07-08 DIAGNOSIS — M542 Cervicalgia: Secondary | ICD-10-CM | POA: Insufficient documentation

## 2018-07-08 DIAGNOSIS — H9203 Otalgia, bilateral: Secondary | ICD-10-CM | POA: Diagnosis not present

## 2018-07-08 LAB — POCT I-STAT CREATININE: CREATININE: 1.2 mg/dL — AB (ref 0.44–1.00)

## 2018-07-08 MED ORDER — IOPAMIDOL (ISOVUE-300) INJECTION 61%
75.0000 mL | Freq: Once | INTRAVENOUS | Status: AC | PRN
  Administered 2018-07-08: 60 mL via INTRAVENOUS

## 2018-07-13 ENCOUNTER — Encounter: Attending: Internal Medicine | Primary: Internal Medicine

## 2018-08-27 ENCOUNTER — Telehealth: Payer: Self-pay | Admitting: *Deleted

## 2018-08-27 DIAGNOSIS — C8331 Diffuse large B-cell lymphoma, lymph nodes of head, face, and neck: Secondary | ICD-10-CM

## 2018-08-27 NOTE — Telephone Encounter (Signed)
Will schedule patient for lab/port flush/md-next available. If port does not flush, then may need to schedule patient for a dye study.

## 2018-08-27 NOTE — Telephone Encounter (Signed)
-----   Message from [place] sent at 08/27/2018  1:30 PM EST ----- Regarding: RE: Portflush and [place] I will wait on my team to respond ----- Message ----- From: Wallene Dales Sent: 08/27/2018   1:17 PM EST To: Ronnell [place], RN, # Subject: Portflush and Re-Est Care                      Patient called and stated that she has been getting her portflushes through Hospice, but this past time they were unable to flush it. She needs to know what to do about that. Also, she is being released from Hospice care and needs to re-est care. Please advise.  Thank you Anna Mcbride

## 2018-08-28 NOTE — Telephone Encounter (Signed)
Per dr. Jacinto Reap- cbc, metc, ldh on day of visit. Orders entered.

## 2018-09-01 ENCOUNTER — Encounter: Payer: Self-pay | Admitting: Internal Medicine

## 2018-09-01 ENCOUNTER — Inpatient Hospital Stay (HOSPITAL_BASED_OUTPATIENT_CLINIC_OR_DEPARTMENT_OTHER): Admitting: Internal Medicine

## 2018-09-01 ENCOUNTER — Inpatient Hospital Stay: Attending: Internal Medicine

## 2018-09-01 ENCOUNTER — Other Ambulatory Visit: Payer: Self-pay

## 2018-09-01 VITALS — BP 125/81 | HR 99 | Temp 97.8°F | Resp 20 | Wt 116.0 lb

## 2018-09-01 DIAGNOSIS — J9610 Chronic respiratory failure, unspecified whether with hypoxia or hypercapnia: Secondary | ICD-10-CM | POA: Insufficient documentation

## 2018-09-01 DIAGNOSIS — Z9484 Stem cells transplant status: Secondary | ICD-10-CM | POA: Insufficient documentation

## 2018-09-01 DIAGNOSIS — Z7982 Long term (current) use of aspirin: Secondary | ICD-10-CM | POA: Diagnosis not present

## 2018-09-01 DIAGNOSIS — Z923 Personal history of irradiation: Secondary | ICD-10-CM | POA: Insufficient documentation

## 2018-09-01 DIAGNOSIS — J449 Chronic obstructive pulmonary disease, unspecified: Secondary | ICD-10-CM | POA: Insufficient documentation

## 2018-09-01 DIAGNOSIS — C8338 Diffuse large B-cell lymphoma, lymph nodes of multiple sites: Secondary | ICD-10-CM

## 2018-09-01 DIAGNOSIS — C8331 Diffuse large B-cell lymphoma, lymph nodes of head, face, and neck: Secondary | ICD-10-CM

## 2018-09-01 DIAGNOSIS — Z79899 Other long term (current) drug therapy: Secondary | ICD-10-CM | POA: Diagnosis not present

## 2018-09-01 DIAGNOSIS — Z9221 Personal history of antineoplastic chemotherapy: Secondary | ICD-10-CM | POA: Insufficient documentation

## 2018-09-01 DIAGNOSIS — E039 Hypothyroidism, unspecified: Secondary | ICD-10-CM | POA: Insufficient documentation

## 2018-09-01 LAB — CBC WITH DIFFERENTIAL/PLATELET
Abs Immature Granulocytes: 0.01 10*3/uL (ref 0.00–0.07)
Basophils Absolute: 0 10*3/uL (ref 0.0–0.1)
Basophils Relative: 1 %
EOS ABS: 0.1 10*3/uL (ref 0.0–0.5)
EOS PCT: 1 %
HEMATOCRIT: 40 % (ref 36.0–46.0)
Hemoglobin: 12.7 g/dL (ref 12.0–15.0)
IMMATURE GRANULOCYTES: 0 %
LYMPHS ABS: 1.1 10*3/uL (ref 0.7–4.0)
LYMPHS PCT: 14 %
MCH: 32 pg (ref 26.0–34.0)
MCHC: 31.8 g/dL (ref 30.0–36.0)
MCV: 100.8 fL — AB (ref 80.0–100.0)
MONOS PCT: 9 %
Monocytes Absolute: 0.6 10*3/uL (ref 0.1–1.0)
NEUTROS PCT: 75 %
Neutro Abs: 5.6 10*3/uL (ref 1.7–7.7)
Platelets: 165 10*3/uL (ref 150–400)
RBC: 3.97 MIL/uL (ref 3.87–5.11)
RDW: 14.4 % (ref 11.5–15.5)
WBC: 7.4 10*3/uL (ref 4.0–10.5)
nRBC: 0 % (ref 0.0–0.2)

## 2018-09-01 LAB — COMPREHENSIVE METABOLIC PANEL
ALK PHOS: 46 U/L (ref 38–126)
ALT: 19 U/L (ref 0–44)
AST: 16 U/L (ref 15–41)
Albumin: 3.7 g/dL (ref 3.5–5.0)
Anion gap: 6 (ref 5–15)
BILIRUBIN TOTAL: 0.6 mg/dL (ref 0.3–1.2)
BUN: 28 mg/dL — AB (ref 8–23)
CALCIUM: 9.1 mg/dL (ref 8.9–10.3)
CO2: 30 mmol/L (ref 22–32)
CREATININE: 1.12 mg/dL — AB (ref 0.44–1.00)
Chloride: 103 mmol/L (ref 98–111)
GFR calc Af Amer: 56 mL/min — ABNORMAL LOW (ref >60)
GFR, EST NON AFRICAN AMERICAN: 48 mL/min — AB (ref >60)
GLUCOSE: 84 mg/dL (ref 70–99)
POTASSIUM: 3.2 mmol/L — AB (ref 3.5–5.1)
Sodium: 139 mmol/L (ref 135–145)
Total Protein: 6.7 g/dL (ref 6.5–8.1)

## 2018-09-01 LAB — LACTATE DEHYDROGENASE: LDH: 147 U/L (ref 98–192)

## 2018-09-01 MED ORDER — HEPARIN SOD (PORK) LOCK FLUSH 100 UNIT/ML IV SOLN
INTRAVENOUS | Status: AC
  Filled 2018-09-01: qty 5

## 2018-09-01 MED ORDER — SODIUM CHLORIDE 0.9% FLUSH
10.0000 mL | Freq: Once | INTRAVENOUS | Status: AC
  Administered 2018-09-01: 10 mL via INTRAVENOUS
  Filled 2018-09-01: qty 10

## 2018-09-01 MED ORDER — HEPARIN SOD (PORK) LOCK FLUSH 100 UNIT/ML IV SOLN
500.0000 [IU] | Freq: Once | INTRAVENOUS | Status: AC
  Administered 2018-09-01: 500 [IU] via INTRAVENOUS

## 2018-09-01 NOTE — Assessment & Plan Note (Addendum)
#   DLBCL-status post relapse; status post autologous stem cell transplant.  last treatment in 2015.   #Patient has no clinical evidence of recurrence of her diffuse B-cell lymphoma;  Continue clinical surveillance.  No role for imaging at this time.  # Chronic respiratory failure on oxygen; multiple lung infections; overall clinically stable.  Discharge from hospice from pulmonary standpoint.  # chronic hypokalemia- today Potassium- 3.2.  cont K 20 /day; stable  #Intermittent pancytopenia-secondary infections/sepsis-poor bone marrow reserve.  Clinically not concerning for any lymphoma involvement.  # Poor IV access- keep port flush every 4m; Stable.   # DISPOSITION: # port flush every 2 months # Follow up in 6 months-MD labs-cbc/cmp/ldh-port flush- Dr.B

## 2018-09-01 NOTE — Progress Notes (Signed)
[place] OFFICE PROGRESS NOTE  Patient Care Team: Baxter Hire, MD as PCP - General (Internal Medicine) Madelyn Brunner, MD as Referring Physician (Unknown Physician Specialty) Christene Lye, MD (General Surgery) Cammie Sickle, MD as Medical Oncologist (Hematology and Oncology) Flora Lipps, MD as Consulting Physician (Pulmonary Disease)  Cancer Staging No matching staging information was found for the patient.   Oncology History   1.  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  Status post 6 cycles of chemotherapy with CHOP9nd involved field radiation treatment 18,00 rads to the mantle field  and mediasinal boost 2340. finished radiation and chemotherapy in November of 2000 residual mediastinotomy mass 2 cm 2.  In May of 2001 progressive disease with mediastinal mass becoming bigger 8 cm3.  The patient underwent 2 cycles of chemotherapy withRituxan, and ICE.(June of 2001) Followed by 22more cycles  at [facility] with DHAP.and high-dose chemotherapy with  BEAM finished treatment in October of 2001. patient had significant pancytopenia as well as fever lasting for 3 months after transplant. Recurrent sinus infection and pulmonary infection 3.  Patient  underwent EUS biopsy of which was negative .  (November, 2012) 4.  Recurrent diffuse large cell lymphoma from the right side of the neck lymph node biopsy done in October of 2014;   Started on RICE october 2014 6.ppatient is now being considered for high-dose chemotherapy and stem cell support (March, 2015) 7.2nd stem cell transplant cannot be done because of the  no stem cell to harvest  #Intermittent pancytopenia-infection/poor bone marrow reserve  #Chronic respiratory failure COPD /24 hours with oxygen [was on hospice; discontinued February 2020]; Dr.Kasa.   #Port flush every 2 months.  DIAGNOSIS: Diffuse large B-cell lymphoma  STAGE:  IV        ;GOALS: Cure  CURRENT/MOST RECENT THERAPY: Surveillance      Lymphoma, non-Hodgkin's ([place])   06/30/2013 Initial Diagnosis    Lymphoma, non-Hodgkin's     Diffuse large B-cell lymphoma of lymph nodes of neck (HCC)    INTERVAL HISTORY:  Anna Mcbride 73 y.o.  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  Patient has been in hospice from pulmonary standpoint given her tenuous respiratory status.  However, since she has not clinically been declining she has been discharged on hospice.  Patient continues her chronic shortness of breath.  Chronic cough chronic wheezing.  Continues to be on home O2.  Chronic fatigue.  No new lumps or bumps.  No headaches.  No nausea no vomiting.  No hospitalizations.  No fevers.   Review of Systems  Constitutional: Positive for malaise/fatigue. Negative for chills, diaphoresis, fever and weight loss.  HENT: Negative for nosebleeds and sore throat.   Eyes: Negative for double vision.  Respiratory: Positive for cough, shortness of breath and wheezing. Negative for hemoptysis and sputum production.   Cardiovascular: Negative for chest pain, palpitations, orthopnea and leg swelling.  Gastrointestinal: Negative for abdominal pain, blood in stool, constipation, diarrhea, heartburn, melena, nausea and vomiting.  Genitourinary: Negative for dysuria, frequency and urgency.  Musculoskeletal: Negative for back pain and joint pain.  Skin: Negative.  Negative for itching and rash.  Neurological: Negative for dizziness, tingling, focal weakness, weakness and headaches.  Endo/Heme/Allergies: Does not bruise/bleed easily.  Psychiatric/Behavioral: Negative for depression. The patient is not nervous/anxious and does not  have insomnia.      PAST MEDICAL HISTORY :  Past Medical History:  Diagnosis Date  . Acquired  hypothyroidism 07/01/2013  . Acute respiratory failure ([place]) 11/29/2014  . 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 Asthma   . Back pain with sciatica 05/07/2017  . Biliary sludge 12/19/2017  . Lymphoma, non-Hodgkin's ([place]) 06/30/2013  . Non Hodgkin's lymphoma ([place]) [id number]  . Personal history of chemotherapy   . Recurrent upper respiratory infection (URI) 09/01/2015  . 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 NASAL SINUS SURGERY  08/07/05 09/09/05   x 2   . VIDEO BRONCHOSCOPY Bilateral 11/30/2014   Procedure: VIDEO BRONCHOSCOPY WITHOUT FLUORO;  Surgeon: Flora Lipps, MD;  Location: [facility] ORS;  Service: Cardiopulmonary;  Laterality: Bilateral;    FAMILY HISTORY :   Family History  Problem Relation Age of Onset  . Asthma Son   . Congestive Heart Failure Mother   . Diabetes Brother   . Pulmonary embolism Father   . Breast cancer Neg Hx     SOCIAL HISTORY:   Social History   Tobacco Use  . Smoking status: Never Smoker  . Smokeless tobacco: Never Used  Substance Use Topics  . Alcohol use: Not Currently    Frequency: Never    Comment: a couple glasses of wine a year  . Drug use: No    ALLERGIES:  is allergic to hydrocodone-homatropine; tussionex pennkinetic er [hydrocod polst-cpm polst er]; and ciprofloxacin.  MEDICATIONS:  Current Outpatient Medications  Medication Sig Dispense Refill  . acetaminophen (TYLENOL) 325 MG tablet Take 2 tablets (650 mg total) by mouth every 6 (six) hours as needed for mild pain (headache).    [name] albuterol (PROVENTIL HFA;VENTOLIN HFA) 108 (90 BASE) MCG/ACT inhaler Inhale 1 puff into the lungs every 6 (six) hours as needed for wheezing or shortness of breath.    . ALPRAZolam (XANAX) 0.5 MG tablet Take 1 tablet (0.5 mg total) by mouth at bedtime. 30 tablet 0  . aspirin EC 81 MG tablet Take 81 mg by mouth daily.    .  citalopram (CELEXA) 20 MG tablet TAKE 1 TABLET BY MOUTH ONCE DAILY 90 tablet 1  . feeding supplement, ENSURE ENLIVE, (ENSURE ENLIVE) LIQD Take 237 mLs by mouth 3 (three) times daily between meals. 90 Bottle 0  . furosemide (LASIX) 20 MG tablet Take 1 tablet (20 mg total) by mouth daily. 30 tablet 1  . guaiFENesin (MUCINEX) 600 MG 12 hr tablet Take 600 mg by mouth 2 (two) times daily.    [name] ipratropium-albuterol (DUONEB) 0.5-2.5 (3) MG/3ML SOLN Take 3 mLs by nebulization every 6 (six) hours. 120 mL 1  . levothyroxine (SYNTHROID, LEVOTHROID) 25 MCG tablet TAKE 1 TABLET BY MOUTH ONCE DAILY ON AN EMPTY STOMACH. WAIT 30 MINUTES BEFORE TAKING OTHER MEDS. 30 tablet 4  . Multiple Vitamin (MULTIVITAMIN WITH MINERALS) TABS tablet Take 1 tablet by mouth daily.    . Multiple Vitamins-Minerals (CENTRUM ADULTS PO) Take 1 tablet by mouth daily.    [name] omeprazole (PRILOSEC) 20 MG capsule TAKE 1 CAPSULE BY MOUTH TWICE DAILY BEFORE MEALS 180 capsule 3  . potassium chloride SA (K-DUR,KLOR-CON) 20 MEQ tablet Take 1 tablet (20 mEq total) by mouth daily. 90 tablet 4  . predniSONE (DELTASONE) 10 MG tablet Take 1 tablet by mouth daily.    [name] Respiratory Therapy Supplies (FLUTTER)  DEVI Use 10-15 times daily 1 each 0  . vitamin C (ASCORBIC ACID) 500 MG tablet Take 500 mg by mouth daily.     No current facility-administered medications for this visit.    Facility-Administered Medications Ordered in Other Visits  Medication Dose Route Frequency Provider Last Rate Last Dose  . heparin lock flush 100 unit/mL  500 Units Intravenous Once Charlaine Dalton R, MD      . sodium chloride flush (NS) 0.9 % injection 10 mL  10 mL Intravenous Once Cammie Sickle, MD        PHYSICAL EXAMINATION: ECOG PERFORMANCE STATUS: 1 - Symptomatic but completely ambulatory  BP 125/81   Pulse 99   Temp 97.8 F (36.6 C) (Tympanic)   Resp 20   Wt 116 lb (52.6 kg)   LMP  (LMP Unknown)   SpO2 98%   BMI 21.22 kg/m   Filed Weights    09/01/18 1115  Weight: 116 lb (52.6 kg)    Physical Exam  Constitutional: She is oriented to person, place, and time.  Thin cachectic appearing female patient.  Home O2.  Alone.  2 L nasal cannula.  HENT:  Head: Normocephalic and atraumatic.  Mouth/Throat: Oropharynx is clear and moist. No oropharyngeal exudate.  Eyes: Pupils are equal, round, and reactive to light.  Neck: Normal range of motion. Neck supple.  Cardiovascular: Normal rate and regular rhythm.  Pulmonary/Chest: No respiratory distress. She has no wheezes.  Decreased air entry scattered wheezing.  Coarse breath sounds.  Abdominal: Soft. Bowel sounds are normal. She exhibits no distension and no mass. There is no abdominal tenderness. There is no rebound and no guarding.  Musculoskeletal: Normal range of motion.        General: No tenderness or edema.  Neurological: She is alert and oriented to person, place, and time.  Skin: Skin is warm.  Mediport in place.  Psychiatric: Affect normal.    LABORATORY DATA:  I have reviewed the data as listed    Component Value Date/Time   NA 139 09/01/2018 1050   NA 140 11/16/2014 1415   K 3.2 (L) 09/01/2018 1050   K 3.3 (L) 11/16/2014 1415   CL 103 09/01/2018 1050   CL 107 11/16/2014 1415   CO2 30 09/01/2018 1050   CO2 29 11/16/2014 1415   GLUCOSE 84 09/01/2018 1050   GLUCOSE 117 (H) 11/16/2014 1415   BUN 28 (H) 09/01/2018 1050   BUN 31 (H) 11/16/2014 1415   CREATININE 1.12 (H) 09/01/2018 1050   CREATININE 1.19 (H) 11/16/2014 1415   CALCIUM 9.1 09/01/2018 1050   CALCIUM 9.1 11/16/2014 1415   PROT 6.7 09/01/2018 1050   PROT 6.8 11/16/2014 1415   ALBUMIN 3.7 09/01/2018 1050   ALBUMIN 3.8 11/16/2014 1415   AST 16 09/01/2018 1050   AST 17 11/16/2014 1415   ALT 19 09/01/2018 1050   ALT 11 (L) 11/16/2014 1415   ALKPHOS 46 09/01/2018 1050   ALKPHOS 115 11/16/2014 1415   BILITOT 0.6 09/01/2018 1050   BILITOT 0.4 11/16/2014 1415   GFRNONAA 48 (L) 09/01/2018 1050    GFRNONAA 46 (L) 11/16/2014 1415   GFRAA 56 (L) 09/01/2018 1050   GFRAA 54 (L) 11/16/2014 1415    No results found for: SPEP, UPEP  Lab Results  Component Value Date   WBC 7.4 09/01/2018   NEUTROABS 5.6 09/01/2018   HGB 12.7 09/01/2018   HCT 40.0 09/01/2018   MCV 100.8 (H) 09/01/2018   PLT 165 09/01/2018  Chemistry      Component Value Date/Time   NA 139 09/01/2018 1050   NA 140 11/16/2014 1415   K 3.2 (L) 09/01/2018 1050   K 3.3 (L) 11/16/2014 1415   CL 103 09/01/2018 1050   CL 107 11/16/2014 1415   CO2 30 09/01/2018 1050   CO2 29 11/16/2014 1415   BUN 28 (H) 09/01/2018 1050   BUN 31 (H) 11/16/2014 1415   CREATININE 1.12 (H) 09/01/2018 1050   CREATININE 1.19 (H) 11/16/2014 1415      Component Value Date/Time   CALCIUM 9.1 09/01/2018 1050   CALCIUM 9.1 11/16/2014 1415   ALKPHOS 46 09/01/2018 1050   ALKPHOS 115 11/16/2014 1415   AST 16 09/01/2018 1050   AST 17 11/16/2014 1415   ALT 19 09/01/2018 1050   ALT 11 (L) 11/16/2014 1415   BILITOT 0.6 09/01/2018 1050   BILITOT 0.4 11/16/2014 1415       RADIOGRAPHIC STUDIES: I have personally reviewed the radiological images as listed and agreed with the findings in the report. No results found.   ASSESSMENT & PLAN:  Diffuse large B-cell lymphoma of lymph nodes of neck ([place]) # DLBCL-status post relapse; status post autologous stem cell transplant.  last treatment in 2015.   #Patient has no clinical evidence of recurrence of her diffuse B-cell lymphoma;  Continue clinical surveillance.  No role for imaging at this time.  # Chronic respiratory failure on oxygen; multiple lung infections; overall clinically stable.  Discharge from hospice from pulmonary standpoint.  # chronic hypokalemia- today Potassium- 3.2.  cont K 20 /day; stable  #Intermittent pancytopenia-secondary infections/sepsis-poor bone marrow reserve.  Clinically not concerning for any lymphoma involvement.  # Poor IV access- keep port flush every 5m;  Stable.   # DISPOSITION: # port flush every 2 months # Follow up in 6 months-MD labs-cbc/cmp/ldh-port flush- Dr.B   No orders of the defined types were placed in this encounter.  All questions were answered. The patient knows to call the clinic with any problems, questions or concerns.      Cammie Sickle, MD 09/03/2018 4:11 PM

## 2018-09-03 ENCOUNTER — Other Ambulatory Visit: Payer: Self-pay | Admitting: *Deleted

## 2018-09-03 DIAGNOSIS — C8590 Non-Hodgkin lymphoma, unspecified, unspecified site: Secondary | ICD-10-CM

## 2018-09-03 DIAGNOSIS — J069 Acute upper respiratory infection, unspecified: Secondary | ICD-10-CM

## 2018-10-27 ENCOUNTER — Emergency Department: Payer: Medicare Other

## 2018-10-27 ENCOUNTER — Encounter: Payer: Self-pay | Admitting: Emergency Medicine

## 2018-10-27 ENCOUNTER — Other Ambulatory Visit: Payer: Self-pay

## 2018-10-27 ENCOUNTER — Inpatient Hospital Stay
Admission: EM | Admit: 2018-10-27 | Discharge: 2018-10-31 | DRG: 871 | Disposition: A | Discharge status: Home Health | Payer: Medicare Other | Attending: Family Medicine | Admitting: Family Medicine

## 2018-10-27 DIAGNOSIS — R6520 Severe sepsis without septic shock: Secondary | ICD-10-CM | POA: Diagnosis present

## 2018-10-27 DIAGNOSIS — R000 Tachycardia, unspecified: Secondary | ICD-10-CM | POA: Diagnosis not present

## 2018-10-27 DIAGNOSIS — J8410 Pulmonary fibrosis, unspecified: Secondary | ICD-10-CM | POA: Diagnosis present

## 2018-10-27 DIAGNOSIS — K219 Gastro-esophageal reflux disease without esophagitis: Secondary | ICD-10-CM | POA: Diagnosis present

## 2018-10-27 DIAGNOSIS — K862 Cyst of pancreas: Secondary | ICD-10-CM | POA: Diagnosis present

## 2018-10-27 DIAGNOSIS — E876 Hypokalemia: Secondary | ICD-10-CM | POA: Diagnosis present

## 2018-10-27 DIAGNOSIS — E785 Hyperlipidemia, unspecified: Secondary | ICD-10-CM | POA: Diagnosis present

## 2018-10-27 DIAGNOSIS — Z888 Allergy status to other drugs, medicaments and biological substances status: Secondary | ICD-10-CM

## 2018-10-27 DIAGNOSIS — Z7952 Long term (current) use of systemic steroids: Secondary | ICD-10-CM

## 2018-10-27 DIAGNOSIS — Z9981 Dependence on supplemental oxygen: Secondary | ICD-10-CM | POA: Diagnosis not present

## 2018-10-27 DIAGNOSIS — Z79890 Hormone replacement therapy (postmenopausal): Secondary | ICD-10-CM

## 2018-10-27 DIAGNOSIS — Z9481 Bone marrow transplant status: Secondary | ICD-10-CM

## 2018-10-27 DIAGNOSIS — J441 Chronic obstructive pulmonary disease with (acute) exacerbation: Secondary | ICD-10-CM | POA: Diagnosis present

## 2018-10-27 DIAGNOSIS — D630 Anemia in neoplastic disease: Secondary | ICD-10-CM | POA: Diagnosis present

## 2018-10-27 DIAGNOSIS — N183 Chronic kidney disease, stage 3 unspecified: Secondary | ICD-10-CM | POA: Diagnosis present

## 2018-10-27 DIAGNOSIS — E871 Hypo-osmolality and hyponatremia: Secondary | ICD-10-CM | POA: Diagnosis present

## 2018-10-27 DIAGNOSIS — R21 Rash and other nonspecific skin eruption: Secondary | ICD-10-CM | POA: Diagnosis present

## 2018-10-27 DIAGNOSIS — I4891 Unspecified atrial fibrillation: Secondary | ICD-10-CM | POA: Diagnosis not present

## 2018-10-27 DIAGNOSIS — C8590 Non-Hodgkin lymphoma, unspecified, unspecified site: Secondary | ICD-10-CM | POA: Diagnosis present

## 2018-10-27 DIAGNOSIS — Z885 Allergy status to narcotic agent status: Secondary | ICD-10-CM

## 2018-10-27 DIAGNOSIS — Z9221 Personal history of antineoplastic chemotherapy: Secondary | ICD-10-CM

## 2018-10-27 DIAGNOSIS — Z7982 Long term (current) use of aspirin: Secondary | ICD-10-CM

## 2018-10-27 DIAGNOSIS — I5022 Chronic systolic (congestive) heart failure: Secondary | ICD-10-CM | POA: Diagnosis present

## 2018-10-27 DIAGNOSIS — J440 Chronic obstructive pulmonary disease with acute lower respiratory infection: Secondary | ICD-10-CM | POA: Diagnosis present

## 2018-10-27 DIAGNOSIS — A419 Sepsis, unspecified organism: Principal | ICD-10-CM | POA: Diagnosis present

## 2018-10-27 DIAGNOSIS — Z833 Family history of diabetes mellitus: Secondary | ICD-10-CM

## 2018-10-27 DIAGNOSIS — Z20828 Contact with and (suspected) exposure to other viral communicable diseases: Secondary | ICD-10-CM | POA: Diagnosis present

## 2018-10-27 DIAGNOSIS — J189 Pneumonia, unspecified organism: Secondary | ICD-10-CM | POA: Diagnosis present

## 2018-10-27 DIAGNOSIS — E860 Dehydration: Secondary | ICD-10-CM | POA: Diagnosis present

## 2018-10-27 DIAGNOSIS — Z79899 Other long term (current) drug therapy: Secondary | ICD-10-CM

## 2018-10-27 DIAGNOSIS — E039 Hypothyroidism, unspecified: Secondary | ICD-10-CM | POA: Diagnosis present

## 2018-10-27 DIAGNOSIS — Z8249 Family history of ischemic heart disease and other diseases of the circulatory system: Secondary | ICD-10-CM

## 2018-10-27 DIAGNOSIS — Z825 Family history of asthma and other chronic lower respiratory diseases: Secondary | ICD-10-CM

## 2018-10-27 DIAGNOSIS — J9621 Acute and chronic respiratory failure with hypoxia: Secondary | ICD-10-CM | POA: Diagnosis present

## 2018-10-27 DIAGNOSIS — R6889 Other general symptoms and signs: Secondary | ICD-10-CM

## 2018-10-27 DIAGNOSIS — Z20822 Contact with and (suspected) exposure to covid-19: Secondary | ICD-10-CM | POA: Diagnosis present

## 2018-10-27 DIAGNOSIS — R531 Weakness: Secondary | ICD-10-CM | POA: Diagnosis not present

## 2018-10-27 DIAGNOSIS — R0602 Shortness of breath: Secondary | ICD-10-CM

## 2018-10-27 DIAGNOSIS — J9600 Acute respiratory failure, unspecified whether with hypoxia or hypercapnia: Secondary | ICD-10-CM

## 2018-10-27 DIAGNOSIS — Z8619 Personal history of other infectious and parasitic diseases: Secondary | ICD-10-CM

## 2018-10-27 DIAGNOSIS — Z881 Allergy status to other antibiotic agents status: Secondary | ICD-10-CM

## 2018-10-27 LAB — COMPREHENSIVE METABOLIC PANEL
ALT: 15 U/L (ref 0–44)
AST: 18 U/L (ref 15–41)
Albumin: 3.4 g/dL — ABNORMAL LOW (ref 3.5–5.0)
Alkaline Phosphatase: 50 U/L (ref 38–126)
Anion gap: 12 (ref 5–15)
BUN: 29 mg/dL — ABNORMAL HIGH (ref 8–23)
CO2: 25 mmol/L (ref 22–32)
Calcium: 9.2 mg/dL (ref 8.9–10.3)
Chloride: 97 mmol/L — ABNORMAL LOW (ref 98–111)
Creatinine, Ser: 1.21 mg/dL — ABNORMAL HIGH (ref 0.44–1.00)
GFR calc Af Amer: 51 mL/min — ABNORMAL LOW (ref >60)
GFR calc non Af Amer: 44 mL/min — ABNORMAL LOW (ref >60)
Glucose, Bld: 100 mg/dL — ABNORMAL HIGH (ref 70–99)
Potassium: 3.4 mmol/L — ABNORMAL LOW (ref 3.5–5.1)
Sodium: 134 mmol/L — ABNORMAL LOW (ref 135–145)
Total Bilirubin: 1 mg/dL (ref 0.3–1.2)
Total Protein: 7 g/dL (ref 6.5–8.1)

## 2018-10-27 LAB — URINALYSIS, ROUTINE W REFLEX MICROSCOPIC
Bacteria, UA: NONE SEEN
Bilirubin Urine: NEGATIVE
Glucose, UA: 50 mg/dL — AB
Hgb urine dipstick: NEGATIVE
Ketones, ur: 20 mg/dL — AB
Leukocytes,Ua: NEGATIVE
Nitrite: NEGATIVE
Protein, ur: 100 mg/dL — AB
Specific Gravity, Urine: 1.021 (ref 1.005–1.030)
Squamous Epithelial / HPF: NONE SEEN (ref 0–5)
pH: 6 (ref 5.0–8.0)

## 2018-10-27 LAB — CBC WITH DIFFERENTIAL/PLATELET
Abs Immature Granulocytes: 0.07 10*3/uL (ref 0.00–0.07)
Basophils Absolute: 0 10*3/uL (ref 0.0–0.1)
Basophils Relative: 0 %
Eosinophils Absolute: 0.1 10*3/uL (ref 0.0–0.5)
Eosinophils Relative: 0 %
HCT: 41.4 % (ref 36.0–46.0)
Hemoglobin: 13.2 g/dL (ref 12.0–15.0)
Immature Granulocytes: 1 %
Lymphocytes Relative: 9 %
Lymphs Abs: 1.2 10*3/uL (ref 0.7–4.0)
MCH: 32.8 pg (ref 26.0–34.0)
MCHC: 31.9 g/dL (ref 30.0–36.0)
MCV: 102.7 fL — ABNORMAL HIGH (ref 80.0–100.0)
Monocytes Absolute: 1.3 10*3/uL — ABNORMAL HIGH (ref 0.1–1.0)
Monocytes Relative: 11 %
Neutro Abs: 9.6 10*3/uL — ABNORMAL HIGH (ref 1.7–7.7)
Neutrophils Relative %: 79 %
Platelets: 148 10*3/uL — ABNORMAL LOW (ref 150–400)
RBC: 4.03 MIL/uL (ref 3.87–5.11)
RDW: 13.6 % (ref 11.5–15.5)
WBC: 12.2 10*3/uL — ABNORMAL HIGH (ref 4.0–10.5)
nRBC: 0 % (ref 0.0–0.2)

## 2018-10-27 LAB — INFLUENZA PANEL BY PCR (TYPE A & B)
Influenza A By PCR: NEGATIVE
Influenza B By PCR: NEGATIVE

## 2018-10-27 LAB — TROPONIN I: Troponin I: 0.04 ng/mL (ref <0.03)

## 2018-10-27 LAB — LACTIC ACID, PLASMA: Lactic Acid, Venous: 1.7 mmol/L (ref 0.5–1.9)

## 2018-10-27 MED ORDER — ACETAMINOPHEN 500 MG PO TABS
1000.0000 mg | ORAL_TABLET | Freq: Once | ORAL | Status: AC
  Administered 2018-10-27: 17:00:00 1000 mg via ORAL
  Filled 2018-10-27: qty 2

## 2018-10-27 MED ORDER — METRONIDAZOLE IN NACL 5-0.79 MG/ML-% IV SOLN
500.0000 mg | Freq: Once | INTRAVENOUS | Status: AC
  Administered 2018-10-27: 500 mg via INTRAVENOUS
  Filled 2018-10-27: qty 100

## 2018-10-27 MED ORDER — IOPAMIDOL (ISOVUE-300) INJECTION 61%
15.0000 mL | INTRAVENOUS | Status: AC
  Administered 2018-10-27: 19:00:00 15 mL via ORAL

## 2018-10-27 MED ORDER — VANCOMYCIN HCL IN DEXTROSE 1-5 GM/200ML-% IV SOLN
1000.0000 mg | Freq: Once | INTRAVENOUS | Status: AC
  Administered 2018-10-27: 17:00:00 1000 mg via INTRAVENOUS
  Filled 2018-10-27: qty 200

## 2018-10-27 MED ORDER — SODIUM CHLORIDE 0.9 % IV BOLUS
1000.0000 mL | Freq: Once | INTRAVENOUS | Status: AC
  Administered 2018-10-27: 1000 mL via INTRAVENOUS

## 2018-10-27 MED ORDER — VANCOMYCIN HCL 10 G IV SOLR
1250.0000 mg | INTRAVENOUS | Status: DC
  Filled 2018-10-27: qty 1250

## 2018-10-27 MED ORDER — SODIUM CHLORIDE 0.9 % IV SOLN
2.0000 g | Freq: Once | INTRAVENOUS | Status: AC
  Administered 2018-10-27: 2 g via INTRAVENOUS
  Filled 2018-10-27: qty 2

## 2018-10-27 MED ORDER — IOHEXOL 300 MG/ML  SOLN
75.0000 mL | Freq: Once | INTRAMUSCULAR | Status: AC | PRN
  Administered 2018-10-27: 21:00:00 75 mL via INTRAVENOUS

## 2018-10-27 MED ORDER — SODIUM CHLORIDE 0.9 % IV BOLUS
500.0000 mL | Freq: Once | INTRAVENOUS | Status: AC
  Administered 2018-10-27: 19:00:00 500 mL via INTRAVENOUS

## 2018-10-27 MED ORDER — SODIUM CHLORIDE 0.9 % IV SOLN
2.0000 g | INTRAVENOUS | Status: DC
  Administered 2018-10-28 – 2018-10-30 (×3): 2 g via INTRAVENOUS
  Filled 2018-10-27 (×4): qty 2

## 2018-10-27 NOTE — ED Notes (Signed)
Pt placed on 2L [place] by MD at this time, pt 100% on o2 sat

## 2018-10-27 NOTE — ED Notes (Signed)
External catheter placed on pt

## 2018-10-27 NOTE — ED Notes (Signed)
Patient transported to CT 

## 2018-10-27 NOTE — H&P (Signed)
[facility] NAME: Anna Mcbride    MR#:  [id number]  DATE OF BIRTH:  10-20-1945  DATE OF ADMISSION:  10/27/2018  PRIMARY CARE PHYSICIAN: Baxter Hire, MD   REQUESTING/REFERRING PHYSICIAN: Kerman Passey, MD  CHIEF COMPLAINT:   Chief Complaint  Patient presents with  . Weakness    HISTORY OF PRESENT ILLNESS:  Anna Mcbride  is a 73 y.o. female who presents with chief complaint as above.  Presents the ED with a complaint of fever and cough.  She states is been going on for the last several days.  Work-up here in the ED shows pleural effusions, with likely pneumonia.  She meets sepsis criteria.  Hospitalist were called for admission  PAST MEDICAL HISTORY:   Past Medical History:  Diagnosis Date  . Acquired hypothyroidism 07/01/2013  . Acute respiratory failure ([place]) 11/29/2014  . 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 Asthma   . Back pain with sciatica 05/07/2017  . Biliary sludge 12/19/2017  . Lymphoma, non-Hodgkin's ([place]) 06/30/2013  . Non Hodgkin's lymphoma ([place]) [id number]  . Personal history of chemotherapy   . Recurrent upper respiratory infection (URI) 09/01/2015  . 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 NASAL SINUS SURGERY  08/07/05 09/09/05   x 2   . VIDEO BRONCHOSCOPY Bilateral 11/30/2014   Procedure: VIDEO BRONCHOSCOPY WITHOUT FLUORO;  Surgeon: Flora Lipps, MD;  Location: [facility] ORS;  Service: Cardiopulmonary;  Laterality: Bilateral;     SOCIAL HISTORY:   Social History   Tobacco Use  . Smoking status: Never Smoker  . Smokeless tobacco: Never Used  Substance Use Topics  . Alcohol use: Not Currently    Frequency: Never    Comment: a couple glasses of wine a year     FAMILY HISTORY:   Family History  Problem Relation Age of Onset  . Asthma Son   . Congestive Heart Failure  Mother   . Diabetes Brother   . Pulmonary embolism Father   . Breast cancer Neg Hx      DRUG ALLERGIES:   Allergies  Allergen Reactions  . Hydrocodone-Homatropine Itching  . Tussionex Pennkinetic Er [Hydrocod Polst-Cpm Polst Er] Itching  . Ciprofloxacin Rash    MEDICATIONS AT HOME:   Prior to Admission medications   Medication Sig Start Date End Date Taking? Authorizing Provider  acetaminophen (TYLENOL) 325 MG tablet Take 2 tablets (650 mg total) by mouth every 6 (six) hours as needed for mild pain (headache). 01/08/18  Yes Gouru, Aruna, MD  albuterol (PROVENTIL HFA;VENTOLIN HFA) 108 (90 BASE) MCG/ACT inhaler Inhale 1 puff into the lungs every 6 (six) hours as needed for wheezing or shortness of breath.   Yes [provider]  ALPRAZolam (XANAX) 0.5 MG tablet Take 1 tablet (0.5 mg total) by mouth at bedtime. 01/08/18  Yes Gouru, Aruna, MD  citalopram (CELEXA) 20 MG tablet TAKE 1 TABLET BY MOUTH ONCE DAILY 12/16/17  Yes Cammie Sickle, MD  feeding supplement, ENSURE ENLIVE, (ENSURE ENLIVE) LIQD Take 237 mLs by mouth 3 (three) times daily between meals. 01/08/18  Yes Gouru, Illene Silver, MD  furosemide (LASIX) 20 MG tablet Take 1 tablet (20 mg total) by mouth daily. 03/04/18  Yes Flora Lipps, MD  guaiFENesin (MUCINEX) 600 MG 12 hr tablet Take 600 mg by mouth daily.    Yes [provider]  ipratropium-albuterol (DUONEB) 0.5-2.5 (3) MG/3ML SOLN Take 3 mLs by nebulization every 6 (six) hours. 01/08/18  Yes Gouru, Aruna, MD  levothyroxine (SYNTHROID, LEVOTHROID) 25 MCG tablet TAKE 1 TABLET BY MOUTH ONCE DAILY ON AN EMPTY STOMACH. WAIT 30 MINUTES BEFORE TAKING OTHER MEDS. 12/03/17  Yes Cammie Sickle, MD  Multiple Vitamin (MULTIVITAMIN WITH MINERALS) TABS tablet Take 1 tablet by mouth daily. 01/09/18  Yes Gouru, Illene Silver, MD  Multiple Vitamins-Minerals (CENTRUM ADULTS PO) Take 1 tablet by mouth daily. 01/09/18  Yes [provider]  omeprazole (PRILOSEC) 20 MG capsule  TAKE 1 CAPSULE BY MOUTH TWICE DAILY BEFORE MEALS 07/07/18  Yes Cammie Sickle, MD  potassium chloride SA (K-DUR,KLOR-CON) 20 MEQ tablet Take 1 tablet (20 mEq total) by mouth daily. 10/27/17  Yes Cammie Sickle, MD  predniSONE (DELTASONE) 10 MG tablet Take 1 tablet by mouth daily. 02/25/18  Yes [provider]  Respiratory Therapy Supplies (FLUTTER) DEVI Use 10-15 times daily 07/29/16  Yes Kasa, Maretta Bees, MD  vitamin C (ASCORBIC ACID) 500 MG tablet Take 1,000 mg by mouth daily.    Yes [provider]    REVIEW OF SYSTEMS:  Review of Systems  Constitutional: Positive for fever and malaise/fatigue. Negative for chills and weight loss.  HENT: Negative for ear pain, hearing loss and tinnitus.   Eyes: Negative for blurred vision, double vision, pain and redness.  Respiratory: Positive for cough and shortness of breath. Negative for hemoptysis.   Cardiovascular: Negative for chest pain, palpitations, orthopnea and leg swelling.  Gastrointestinal: Negative for abdominal pain, constipation, diarrhea, nausea and vomiting.  Genitourinary: Negative for dysuria, frequency and hematuria.  Musculoskeletal: Negative for back pain, joint pain and neck pain.  Skin:       No acne, rash, or lesions  Neurological: Negative for dizziness, tremors, focal weakness and weakness.  Endo/Heme/Allergies: Negative for polydipsia. Does not bruise/bleed easily.  Psychiatric/Behavioral: Negative for depression. The patient is not nervous/anxious and does not have insomnia.      VITAL SIGNS:   Vitals:   10/27/18 2000 10/27/18 2030 10/27/18 2100 10/27/18 2130  BP: (!) 86/54 (!) 84/57 94/63 109/62  Pulse: (!) 50 (!) 104 99 (!) 103  Resp: (!) 21 (!) 25 (!) 23 (!) 22  Temp:      TempSrc:      SpO2: 95% 96% 98% 97%  Weight:      Height:       Wt Readings from Last 3 Encounters:  10/27/18 52.2 kg  09/01/18 52.6 kg  03/04/18 50.8 kg    PHYSICAL EXAMINATION:  Physical Exam  Vitals  reviewed. Constitutional: She is oriented to person, place, and time. She appears well-developed and well-nourished. No distress.  HENT:  Head: Normocephalic and atraumatic.  Mouth/Throat: Oropharynx is clear and moist.  Eyes: Pupils are equal, round, and reactive to light. Conjunctivae and EOM are normal. No scleral icterus.  Neck: Normal range of motion. Neck supple. No JVD present. No thyromegaly present.  Cardiovascular: Regular rhythm and intact distal pulses. Exam reveals no gallop and no friction rub.  No murmur heard. Tachycardic  Respiratory: Effort normal. No respiratory distress. She has no wheezes. She has rales.  Rhonchi  GI: Soft. Bowel sounds are normal. She exhibits no distension. There is no abdominal tenderness.  Musculoskeletal: Normal range of motion.        General: No edema.     Comments: No arthritis, no gout  Lymphadenopathy:    She has no cervical  adenopathy.  Neurological: She is alert and oriented to person, place, and time. No cranial nerve deficit.  No dysarthria, no aphasia  Skin: Skin is warm and dry. No rash noted. No erythema.  Psychiatric: She has a normal mood and affect. Her behavior is normal. Judgment and thought content normal.    LABORATORY PANEL:   CBC Recent Labs  Lab 10/27/18 1658  WBC 12.2*  HGB 13.2  HCT 41.4  PLT 148*   ------------------------------------------------------------------------------------------------------------------  Chemistries  Recent Labs  Lab 10/27/18 1658  NA 134*  K 3.4*  CL 97*  CO2 25  GLUCOSE 100*  BUN 29*  CREATININE 1.21*  CALCIUM 9.2  AST 18  ALT 15  ALKPHOS 50  BILITOT 1.0   ------------------------------------------------------------------------------------------------------------------  Cardiac Enzymes Recent Labs  Lab 10/27/18 1658  TROPONINI 0.04*   ------------------------------------------------------------------------------------------------------------------  RADIOLOGY:   Ct Abdomen Pelvis W Contrast  Result Date: 10/27/2018 CLINICAL DATA:  Right-sided abdominal and flank pain. Acute abdominal pain. EXAM: CT ABDOMEN AND PELVIS WITH CONTRAST TECHNIQUE: Multidetector CT imaging of the abdomen and pelvis was performed using the standard protocol following bolus administration of intravenous contrast. CONTRAST:  51mL OMNIPAQUE IOHEXOL 300 MG/ML  SOLN COMPARISON:  Abdominal CT 12/19/2017. Chest radiograph earlier this day as well as 03/01/2018 FINDINGS: Motion artifact limits assessment of the chest, abdomen, and pelvis. Lower chest: Small layering left pleural effusion. Moderate right pleural effusion is partially loculated anteriorly. Vague geographic ground-glass opacities in the left lung base which are partially obscured by motion. Hepatobiliary: Slight heterogeneous enhancement. Small cyst in the right lobe is unchanged from prior exam. Gallbladder physiologically distended, no calcified stone. No biliary dilatation. Pancreas: Motion artifact. No ductal dilatation or inflammation. There is a 5 mm cyst in the proximal pancreatic body, image 32 series 2. Spleen: Normal in size without gross abnormality, motion obscured. Adrenals/Urinary Tract: Normal adrenal glands. Right kidney is slightly low in positioning. No hydronephrosis or perinephric edema. Slight delayed excretion on delayed phase imaging. Motion artifact limits detailed assessment. Urinary bladder is physiologically distended. No bladder wall thickening. Stomach/Bowel: Small hiatal hernia. Stomach grossly unremarkable. No bowel obstruction. Enteric contrast reaches the colon. Diverticulosis from the mid transverse colon distally. Diverticular prominent in the sigmoid colon. No evidence of diverticulitis. Air in stool distends the rectum. No rectal wall thickening. No obvious bowel inflammation. Appendix not confidently visualized, no evidence of appendicitis. Vascular/Lymphatic: Advanced aortic atherosclerosis. IVC  appears prominent. Moderate branch atherosclerosis. No acute vascular finding. Portal vein appears patent. No definite enlarged lymph nodes in the abdomen or pelvis. Reproductive: Uterus and bilateral adnexa are unremarkable. Ovaries are quiescent, normal for age. Other: No ascites or free air. No evidence of intra-abdominal abscess. Musculoskeletal: There are no acute or suspicious osseous abnormalities. Degenerative change in the lower lumbar spine. IMPRESSION: 1. Slight heterogeneity of the liver is nonspecific, but may be due to elevated right heart pressures given prominent IVC. No other acute finding in the abdomen or pelvis. Motion artifact slightly limits detailed assessment. 2. Bilateral pleural effusions, small on the left and moderate on the right and partially loculated. Chest x-ray earlier today demonstrated similar radiographic appearance of the chest dating back to 03/01/2018, and findings may be chronic. 3. Colonic diverticulosis without diverticulitis. 4. Incidental 5 mm cyst in the proximal pancreatic body. Recommend follow up pre and post contrast MRI/MRCP or pancreatic protocol CT in 2 years. This recommendation follows ACR consensus guidelines: Management of Incidental Pancreatic Cysts: A White Paper of the ACR Incidental Findings Committee. J  Am Coll Radiol 7169;67:893-810. 5.  Advanced Aortic atherosclerosis (ICD10-I70.0). Electronically Signed   By: Keith Rake M.D.   On: 10/27/2018 21:37   Dg Chest Port 1 View  Result Date: 10/27/2018 CLINICAL DATA:  Fever and cough EXAM: PORTABLE CHEST 1 VIEW COMPARISON:  03/01/2018 FINDINGS: Extensive chronic lung disease apical scarring bilaterally. Extensive on the right with retraction of the right hilum. Left hilar retraction also unchanged due to apical scarring. Extensive pleuroparenchymal scarring in the right lung base unchanged. Diffuse parenchymal airspace density appears chronic and unchanged. Port-A-Cath tip in the SVC. IMPRESSION:  Extensive chronic lung disease bilaterally right greater than left, stable from prior studies. No superimposed acute abnormality. Electronically Signed   By: Franchot Gallo M.D.   On: 10/27/2018 18:21    EKG:   Orders placed or performed during the hospital encounter of 10/27/18  . ED EKG 12-Lead  . ED EKG 12-Lead  . EKG 12-Lead  . EKG 12-Lead    IMPRESSION AND PLAN:  Principal Problem:   Severe sepsis (HCC) -IV antibiotics initiated, lactic acid within normal limits, blood pressure stable, sepsis is due to pneumonia, troponin barely elevated, we will trend the troponin, culture sent Active Problems:   CAP (community acquired pneumonia) -IV antibiotics and supportive care   Suspected Covid-19 Virus Infection -test pending   HLD (hyperlipidemia) -home dose antilipid   Adult hypothyroidism -home dose thyroid replacement   GERD (gastroesophageal reflux disease) -home dose PPI   CKD (chronic kidney disease), stage III ([place]) -at baseline, avoid nephrotoxins and monitor  Chart review performed and case discussed with ED provider. Labs, imaging and/or ECG reviewed by provider and discussed with patient/family. Management plans discussed with the patient and/or family.  DVT PROPHYLAXIS: SubQ lovenox   GI PROPHYLAXIS:  PPI   ADMISSION STATUS: Inpatient     CODE STATUS: Full Code Status History    Date Active Date Inactive Code Status Order ID Comments User Context   03/01/2018 2245 03/03/2018 1718 Full Code [id number]  Sela Hua, MD Inpatient   01/02/2018 1206 01/08/2018 2006 Full Code [id number]  [name], MD Inpatient   12/20/2017 0128 12/21/2017 1718 Full Code [id number]  Lance Coon, MD Inpatient   05/29/2017 1740 05/30/2017 1649 Full Code [id number]  Henreitta Leber, MD Inpatient   11/26/2016 1844 11/29/2016 1513 Full Code [id number]  Henreitta Leber, MD Inpatient   09/12/2016 0223 09/13/2016 1756 DNR [id number]  Harrie Foreman, MD ED   06/18/2016 1821 06/20/2016 1535 DNR [id number]   Vaughan Basta, MD Inpatient   11/29/2014 1405 12/05/2014 1550 DNR [id number]  Evlyn Kanner, NP Inpatient      TOTAL TIME TAKING CARE OF THIS PATIENT: 45 minutes.   Ethlyn Daniels 10/27/2018, 10:04 PM  [facility]  Office  [phone]  CC: Primary care physician; Baxter Hire, MD  Note:  This document was prepared using Dragon voice recognition software and may include unintentional dictation errors.

## 2018-10-27 NOTE — ED Notes (Signed)
Pt finished with oral contrast, CT notified and MD is aware of pts BP.

## 2018-10-27 NOTE — ED Provider Notes (Signed)
[facility] Emergency Department Provider Note  Time seen: 4:59 PM  I have reviewed the triage vital signs and the nursing notes.   HISTORY  Chief Complaint Weakness   HPI Anna Mcbride is a 73 y.o. female with a past medical history of anemia, lymphoma, gastric reflux, pneumonia, presents to the emergency department for fever cough and generalized weakness.  According to the patient for the past several days she has felt very weak, did a telemetry medicine consult with her doctor, patient took her blood pressure was found to be low in the 70s so she was referred to the emergency department.  Upon arrival to the emergency department patient found to be febrile, her only complaint has been a cough.  Patient denies any chest pain or abdominal pain.  No vomiting or diarrhea but states some nausea.  No dysuria.  No recent travel or known exposure to ill people.  Patient is somewhat fatigued appearing during my evaluation.  Will fall asleep at times during my evaluation but awakens to voice and answers questions appropriately.  Remains alert and oriented.   Past Medical History:  Diagnosis Date  . Acquired hypothyroidism 07/01/2013  . Acute respiratory failure ([place]) 11/29/2014  . 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 Asthma   . Back pain with sciatica 05/07/2017  . Biliary sludge 12/19/2017  . Lymphoma, non-Hodgkin's ([place]) 06/30/2013  . Non Hodgkin's lymphoma ([place]) [id number]  . Personal history of chemotherapy   . Recurrent upper respiratory infection (URI) 09/01/2015  . 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 Malnutrition of moderate degree 01/08/2018  . Adjustment disorder with anxiety 01/06/2018  . Epigastric abdominal pain   . History of esophagitis 12/29/2017  . GERD (gastroesophageal reflux disease) 12/19/2017  . Biliary sludge 12/19/2017  . CKD (chronic kidney disease), stage III ([place])  12/19/2017  . Chronic respiratory failure with hypoxia, on home O2 therapy ([place]) 12/16/2017  . Back pain with sciatica 05/07/2017  . Sepsis ([place]) 09/12/2016  . Pneumothorax   . 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 Recurrent upper respiratory infection (URI) 09/01/2015  . Acute respiratory failure with hypoxia ([place])   . 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 SOB (shortness of breath)   . Pneumonia   . Acute respiratory failure ([place]) 11/29/2014  . History of digestive disease 11/29/2014  . HLD (hyperlipidemia) 11/29/2014  . Adult hypothyroidism 11/29/2014  . Lymphoma ([place]) 11/29/2014  . RAD (reactive airway disease) 11/29/2014  . 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 Pain in shoulder 11/01/2014  . Fracture of humerus, proximal 11/01/2014  . Febrile neutropenia ([place]) 11/08/2013  . Decreased potassium in the blood 09/02/2013  . Acquired hypothyroidism 07/01/2013  . Lymphoma, non-Hodgkin's ([place]) 06/30/2013  . Cervical lymphadenopathy 04/20/2013  . History of lymphoma 04/20/2013  . Dyspnea 01/29/2011  . Cough 01/29/2011    Past Surgical History:  Procedure Laterality Date  . 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 NASAL SINUS SURGERY  08/07/05 09/09/05   x 2   . VIDEO BRONCHOSCOPY Bilateral 11/30/2014   Procedure: VIDEO BRONCHOSCOPY WITHOUT FLUORO;  Surgeon: Flora Lipps, MD;  Location: [facility] ORS;  Service: Cardiopulmonary;  Laterality: Bilateral;    Prior to Admission medications   Medication Sig Start Date End Date Taking? Authorizing Provider  acetaminophen (TYLENOL) 325 MG tablet Take 2 tablets (650 mg total) by mouth every 6 (  six) hours as needed for mild pain (headache). 01/08/18   Gouru, Illene Silver, MD  albuterol (PROVENTIL HFA;VENTOLIN HFA) 108 (90 BASE) MCG/ACT inhaler Inhale 1 puff into the lungs every 6 (six) hours as needed for wheezing or shortness of breath.    [provider]  ALPRAZolam Duanne Moron) 0.5 MG tablet Take 1  tablet (0.5 mg total) by mouth at bedtime. 01/08/18   [name], MD  aspirin EC 81 MG tablet Take 81 mg by mouth daily.    [provider]  citalopram (CELEXA) 20 MG tablet TAKE 1 TABLET BY MOUTH ONCE DAILY 12/16/17   Cammie Sickle, MD  feeding supplement, ENSURE ENLIVE, (ENSURE ENLIVE) LIQD Take 237 mLs by mouth 3 (three) times daily between meals. 01/08/18   [name], MD  furosemide (LASIX) 20 MG tablet Take 1 tablet (20 mg total) by mouth daily. 03/04/18   Flora Lipps, MD  guaiFENesin (MUCINEX) 600 MG 12 hr tablet Take 600 mg by mouth 2 (two) times daily.    [provider]  ipratropium-albuterol (DUONEB) 0.5-2.5 (3) MG/3ML SOLN Take 3 mLs by nebulization every 6 (six) hours. 01/08/18   Gouru, Illene Silver, MD  levothyroxine (SYNTHROID, LEVOTHROID) 25 MCG tablet TAKE 1 TABLET BY MOUTH ONCE DAILY ON AN EMPTY STOMACH. WAIT 30 MINUTES BEFORE TAKING OTHER MEDS. 12/03/17   Cammie Sickle, MD  Multiple Vitamin (MULTIVITAMIN WITH MINERALS) TABS tablet Take 1 tablet by mouth daily. 01/09/18   [name], MD  Multiple Vitamins-Minerals (CENTRUM ADULTS PO) Take 1 tablet by mouth daily. 01/09/18   [provider]  omeprazole (PRILOSEC) 20 MG capsule TAKE 1 CAPSULE BY MOUTH TWICE DAILY BEFORE MEALS 07/07/18   Cammie Sickle, MD  potassium chloride SA (K-DUR,KLOR-CON) 20 MEQ tablet Take 1 tablet (20 mEq total) by mouth daily. 10/27/17   Cammie Sickle, MD  predniSONE (DELTASONE) 10 MG tablet Take 1 tablet by mouth daily. 02/25/18   [provider]  Respiratory Therapy Supplies (FLUTTER) DEVI Use 10-15 times daily 07/29/16   Flora Lipps, MD  vitamin C (ASCORBIC ACID) 500 MG tablet Take 500 mg by mouth daily.    [provider]    Allergies  Allergen Reactions  . Hydrocodone-Homatropine Itching  . Tussionex Pennkinetic Er [Hydrocod Polst-Cpm Polst Er] Itching  . Ciprofloxacin Rash    Family History  Problem Relation Age of Onset  . Asthma  Son   . Congestive Heart Failure Mother   . Diabetes Brother   . Pulmonary embolism Father   . Breast cancer Neg Hx     Social History Social History   Tobacco Use  . Smoking status: Never Smoker  . Smokeless tobacco: Never Used  Substance Use Topics  . Alcohol use: Not Currently    Frequency: Never    Comment: a couple glasses of wine a year  . Drug use: No    Review of Systems Constitutional: Positive for fever.  Positive for generalized weakness. ENT: Negative for congestion Cardiovascular: Negative for chest pain. Respiratory: Positive for cough.  No significant shortness of breath. Gastrointestinal: Negative for abdominal pain, vomiting and diarrhea.  Mild nausea. Genitourinary: Negative for urinary compaints Musculoskeletal: Negative for musculoskeletal complaints Skin: Negative for skin complaints  Neurological: Negative for headache All other ROS negative  ____________________________________________   PHYSICAL EXAM:  Constitutional: Patient is awake and alert but is somewhat fatigued appearing fell asleep twice during my evaluation but awakens to voice answers questions appropriately. Eyes: Normal exam ENT   Head: Normocephalic and  atraumatic   Mouth/Throat: Mucous membranes are moist. Cardiovascular: Regular rhythm rate around 110 bpm.  No obvious murmur. Respiratory: Mild tachypnea, no respiratory distress. Breath sounds are clear.  No wheeze rales or rhonchi but occasional cough during exam. Gastrointestinal: Soft and nontender. No distention. Musculoskeletal: Nontender with normal range of motion in all extremities.  Neurologic:  Normal speech and language. No gross focal neurologic deficits Skin:  Skin is warm, dry and intact.  Patient does have chronic rash to all extremities which she states is longstanding. Psychiatric: Mood and affect are normal.  ____________________________________________    EKG  EKG viewed and interpreted by myself  shows a sinus tachycardia 108 bpm with a widened QRS, normal axis, normal intervals nonspecific ST changes with mild depression in the inferolateral leads.  EKG does appear to be rather significantly changed from prior 03/01/2018.  Cardiac enzymes have been added onto the patient's work-up.  ____________________________________________    RADIOLOGY  IMPRESSION: Extensive chronic lung disease bilaterally right greater than left, stable from prior studies. No superimposed acute abnormality.  IMPRESSION: 1. Slight heterogeneity of the liver is nonspecific, but may be due to elevated right heart pressures given prominent IVC. No other acute finding in the abdomen or pelvis. Motion artifact slightly limits detailed assessment. 2. Bilateral pleural effusions, small on the left and moderate on the right and partially loculated. Chest x-ray earlier today demonstrated similar radiographic appearance of the chest dating back to 03/01/2018, and findings may be chronic. 3. Colonic diverticulosis without diverticulitis. 4. Incidental 5 mm cyst in the proximal pancreatic body. Recommend follow up pre and post contrast MRI/MRCP or pancreatic protocol CT in 2 years. This recommendation follows ACR consensus guidelines: Management of Incidental Pancreatic Cysts: A White Paper of the ACR Incidental Findings Committee. J Am Coll Radiol 7893;81:[id number]. 5. Advanced Aortic atherosclerosis (ICD10-I70.0).  ____________________________________________   INITIAL IMPRESSION / ASSESSMENT AND PLAN / ED COURSE  Pertinent labs & imaging results that were available during my care of the patient were reviewed by me and considered in my medical decision making (see chart for details).  Patient presents to the emergency department for generalized fatigue, weakness found to have low blood pressure per patient's blood pressure cuff at home, found to be febrile in the emergency department.  Only complaint appears to  be cough.  Differential is quite broad at this time but would include pneumonia, URI, viral illness including influenza or coronavirus, UTI.  We will check labs, send cultures, start broad-spectrum antibiotics.  We will also send a corona and flu swab.  Patient agreeable to plan of care.  Given the patient's tachypnea, tachycardia, reported hypotension and now fever patient will be admitted to the hospital once her work-up is been completed.  Patient will be placed on droplet as well as contact precautions while corona is pending.  Patient's lab work shows a slight leukocytosis otherwise largely negative work-up thus far including negative influenza.  Patient is now complaining of a right-sided abdominal pain with mild tenderness to this area on re-check.  We will obtain CT imaging the abdomen/pelvis to further evaluate.  Patient agreeable to plan of care.  Regardless patient will require admission to the hospital once her work-up is completed.  No obvious cause for fever on the CT scan.  We will admit to the hospital service for continued treatment.  Anna Mcbride was evaluated in Emergency Department on 10/27/2018 for the symptoms described in the history of present illness. She was evaluated in the context of  the global COVID-19 pandemic, which necessitated consideration that the patient might be at risk for infection with the SARS-CoV-2 virus that causes COVID-19. Institutional protocols and algorithms that pertain to the evaluation of patients at risk for COVID-19 are in a state of rapid change based on information released by regulatory bodies including the CDC and federal and state organizations. These policies and algorithms were followed during the patient's care in the ED.  CRITICAL CARE Performed by: Harvest Dark   Total critical care time: 45 minutes  Critical care time was exclusive of separately billable procedures and treating other patients.  Critical care was necessary to treat or  prevent imminent or life-threatening deterioration.  Critical care was time spent personally by me on the following activities: development of treatment plan with patient and/or surrogate as well as nursing, discussions with consultants, evaluation of patient's response to treatment, examination of patient, obtaining history from patient or surrogate, ordering and performing treatments and interventions, ordering and review of laboratory studies, ordering and review of radiographic studies, pulse oximetry and re-evaluation of patient's condition.   ____________________________________________   FINAL CLINICAL IMPRESSION(S) / ED DIAGNOSES  Sepsis Cough Fever Weakness   Harvest Dark, MD 10/27/18 [id number]

## 2018-10-27 NOTE — ED Triage Notes (Signed)
Pt from home via POV with c/o hypotension, pt presents with fever and cough. PT A&OX4, MD at bedside

## 2018-10-27 NOTE — ED Notes (Signed)
ED TO INPATIENT HANDOFF REPORT  ED Nurse Name and Phone #:  Jerald Kief, RN  [phone]  S Name/Age/Gender Anna Mcbride 73 y.o. female Room/Bed: ED18A/ED18A  Code Status   Code Status: Prior  Home/SNF/Other Home Patient oriented to: self, place, time and situation Is this baseline? Yes   Triage Complete: Triage complete  Chief Complaint Low bp  Triage Note Pt from home via [place] with c/o hypotension, pt presents with fever and cough. PT A&OX4, MD at bedside    Allergies Allergies  Allergen Reactions  . Hydrocodone-Homatropine Itching  . Tussionex Pennkinetic Er [Hydrocod Polst-Cpm Polst Er] Itching  . Ciprofloxacin Rash    Level of Care/Admitting Diagnosis ED Disposition    ED Disposition Condition [facility] Area: [place] [[id number]]  Level of Care: Med-Surg [16]  Diagnosis: Severe sepsis [facility]) [[id number]]  Admitting Physician: Lance Coon [[id number]]  Attending Physician: Lance Coon [phone]  Estimated length of stay: past midnight tomorrow  Certification:: I certify this patient will need inpatient services for at least 2 midnights  Bed request comments: suspected covid-19, low risk, contact and droplet precautions  PT Class (Do Not Modify): Inpatient [101]  PT Acc Code (Do Not Modify): Private [1]       B Medical/Surgery History Past Medical History:  Diagnosis Date  . Acquired hypothyroidism 07/01/2013  . Acute respiratory failure ([place]) 11/29/2014  . 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 Asthma   . Back pain with sciatica 05/07/2017  . Biliary sludge 12/19/2017  . Lymphoma, non-Hodgkin's ([place]) 06/30/2013  . Non Hodgkin's lymphoma ([place]) [id number]  . Personal history of chemotherapy   . Recurrent upper respiratory infection (URI) 09/01/2015  . 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 NASAL SINUS SURGERY   08/07/05 09/09/05   x 2   . VIDEO BRONCHOSCOPY Bilateral 11/30/2014   Procedure: VIDEO BRONCHOSCOPY WITHOUT FLUORO;  Surgeon: Flora Lipps, MD;  Location: [facility] ORS;  Service: Cardiopulmonary;  Laterality: Bilateral;     A IV Location/Drains/Wounds Patient Lines/Drains/Airways Status   Active Line/Drains/Airways    Name:   Placement date:   Placement time:   Site:   Days:   Implanted Port Left Chest   -    -    Chest      Implanted Port Left Chest   -    -    Chest      Peripheral IV 10/27/18 Right Antecubital   10/27/18    1703    Antecubital   less than 1   Peripheral IV 10/27/18 Left Antecubital   10/27/18    1704    Antecubital   less than 1          Intake/Output Last 24 hours  Intake/Output Summary (Last 24 hours) at 10/27/2018 2244 Last data filed at 10/27/2018 2040 Gross per 24 hour  Intake 1543.11 ml  Output -  Net 1543.11 ml    Labs/Imaging Results for orders placed or performed during the hospital encounter of 10/27/18 (from the past 48 hour(s))  Lactic acid, plasma     Status: None   Collection Time: 10/27/18  4:58 PM  Result Value Ref Range   Lactic Acid, Venous 1.7 0.5 - 1.9 mmol/L    Comment: Performed at [facility], [address], [place], [place] [address]  Comprehensive metabolic panel     Status: Abnormal  Collection Time: 10/27/18  4:58 PM  Result Value Ref Range   Sodium 134 (L) 135 - 145 mmol/L   Potassium 3.4 (L) 3.5 - 5.1 mmol/L   Chloride 97 (L) 98 - 111 mmol/L   CO2 25 22 - 32 mmol/L   Glucose, Bld 100 (H) 70 - 99 mg/dL   BUN 29 (H) 8 - 23 mg/dL   Creatinine, Ser 1.21 (H) 0.44 - 1.00 mg/dL   Calcium 9.2 8.9 - 10.3 mg/dL   Total Protein 7.0 6.5 - 8.1 g/dL   Albumin 3.4 (L) 3.5 - 5.0 g/dL   AST 18 15 - 41 U/L   ALT 15 0 - 44 U/L   Alkaline Phosphatase 50 38 - 126 U/L   Total Bilirubin 1.0 0.3 - 1.2 mg/dL   GFR calc non Af Amer 44 (L) >60 mL/min   GFR calc Af Amer 51 (L) >60 mL/min   Anion gap 12 5 - 15    Comment: Performed at  [facility], [address] [place], [place] [address]  CBC WITH DIFFERENTIAL     Status: Abnormal   Collection Time: 10/27/18  4:58 PM  Result Value Ref Range   WBC 12.2 (H) 4.0 - 10.5 K/uL   RBC 4.03 3.87 - 5.11 MIL/uL   Hemoglobin 13.2 12.0 - 15.0 g/dL   HCT 41.4 36.0 - 46.0 %   MCV 102.7 (H) 80.0 - 100.0 fL   MCH 32.8 26.0 - 34.0 pg   MCHC 31.9 30.0 - 36.0 g/dL   RDW 13.6 11.5 - 15.5 %   Platelets 148 (L) 150 - 400 K/uL   nRBC 0.0 0.0 - 0.2 %   Neutrophils Relative % 79 %   Neutro Abs 9.6 (H) 1.7 - 7.7 K/uL   Lymphocytes Relative 9 %   Lymphs Abs 1.2 0.7 - 4.0 K/uL   Monocytes Relative 11 %   Monocytes Absolute 1.3 (H) 0.1 - 1.0 K/uL   Eosinophils Relative 0 %   Eosinophils Absolute 0.1 0.0 - 0.5 K/uL   Basophils Relative 0 %   Basophils Absolute 0.0 0.0 - 0.1 K/uL   Immature Granulocytes 1 %   Abs Immature Granulocytes 0.07 0.00 - 0.07 K/uL    Comment: Performed at [facility], [address] [place], [place] [address]  Troponin I - ONCE - STAT     Status: Abnormal   Collection Time: 10/27/18  4:58 PM  Result Value Ref Range   Troponin I 0.04 (HH) <0.03 ng/mL    Comment: CRITICAL RESULT CALLED TO, READ BACK BY AND VERIFIED WITH Martinique MOORE @ 1737 ON 10/27/18 BY JUW Performed at [facility], [address] [place], [place] [address]   Urinalysis, Routine w reflex microscopic     Status: Abnormal   Collection Time: 10/27/18  5:00 PM  Result Value Ref Range   Color, Urine YELLOW (A) YELLOW   APPearance HAZY (A) CLEAR   Specific Gravity, Urine 1.021 1.005 - 1.030   pH 6.0 5.0 - 8.0   Glucose, UA 50 (A) NEGATIVE mg/dL   Hgb urine dipstick NEGATIVE NEGATIVE   Bilirubin Urine NEGATIVE NEGATIVE   Ketones, ur 20 (A) NEGATIVE mg/dL   Protein, ur 100 (A) NEGATIVE mg/dL   Nitrite NEGATIVE NEGATIVE   Leukocytes,Ua NEGATIVE NEGATIVE   RBC / HPF 11-20 0 - 5 RBC/hpf   WBC, UA 0-5 0 - 5 WBC/hpf   Bacteria, UA NONE SEEN NONE SEEN   Squamous  Epithelial /  LPF NONE SEEN 0 - 5   Mucus PRESENT    Hyaline Casts, UA PRESENT     Comment: Performed at [facility], [address] [place], [place] [address]  Influenza panel by PCR (type A & B)     Status: None   Collection Time: 10/27/18  5:00 PM  Result Value Ref Range   Influenza A By PCR NEGATIVE NEGATIVE   Influenza B By PCR NEGATIVE NEGATIVE    Comment: (NOTE) The Xpert Xpress Flu assay is intended as an aid in the diagnosis of  influenza and should not be used as a sole basis for treatment.  This  assay is FDA approved for nasopharyngeal swab specimens only. Nasal  washings and aspirates are unacceptable for Xpert Xpress Flu testing. Performed at [facility], [address] [place], [place] [address]    Ct Abdomen Pelvis W Contrast  Result Date: 10/27/2018 CLINICAL DATA:  Right-sided abdominal and flank pain. Acute abdominal pain. EXAM: CT ABDOMEN AND PELVIS WITH CONTRAST TECHNIQUE: Multidetector CT imaging of the abdomen and pelvis was performed using the standard protocol following bolus administration of intravenous contrast. CONTRAST:  64mL OMNIPAQUE IOHEXOL 300 MG/ML  SOLN COMPARISON:  Abdominal CT 12/19/2017. Chest radiograph earlier this day as well as 03/01/2018 FINDINGS: Motion artifact limits assessment of the chest, abdomen, and pelvis. Lower chest: Small layering left pleural effusion. Moderate right pleural effusion is partially loculated anteriorly. Vague geographic ground-glass opacities in the left lung base which are partially obscured by motion. Hepatobiliary: Slight heterogeneous enhancement. Small cyst in the right lobe is unchanged from prior exam. Gallbladder physiologically distended, no calcified stone. No biliary dilatation. Pancreas: Motion artifact. No ductal dilatation or inflammation. There is a 5 mm cyst in the proximal pancreatic body, image 32 series 2. Spleen: Normal in size without gross abnormality, motion obscured.  Adrenals/Urinary Tract: Normal adrenal glands. Right kidney is slightly low in positioning. No hydronephrosis or perinephric edema. Slight delayed excretion on delayed phase imaging. Motion artifact limits detailed assessment. Urinary bladder is physiologically distended. No bladder wall thickening. Stomach/Bowel: Small hiatal hernia. Stomach grossly unremarkable. No bowel obstruction. Enteric contrast reaches the colon. Diverticulosis from the mid transverse colon distally. Diverticular prominent in the sigmoid colon. No evidence of diverticulitis. Air in stool distends the rectum. No rectal wall thickening. No obvious bowel inflammation. Appendix not confidently visualized, no evidence of appendicitis. Vascular/Lymphatic: Advanced aortic atherosclerosis. IVC appears prominent. Moderate branch atherosclerosis. No acute vascular finding. Portal vein appears patent. No definite enlarged lymph nodes in the abdomen or pelvis. Reproductive: Uterus and bilateral adnexa are unremarkable. Ovaries are quiescent, normal for age. Other: No ascites or free air. No evidence of intra-abdominal abscess. Musculoskeletal: There are no acute or suspicious osseous abnormalities. Degenerative change in the lower lumbar spine. IMPRESSION: 1. Slight heterogeneity of the liver is nonspecific, but may be due to elevated right heart pressures given prominent IVC. No other acute finding in the abdomen or pelvis. Motion artifact slightly limits detailed assessment. 2. Bilateral pleural effusions, small on the left and moderate on the right and partially loculated. Chest x-ray earlier today demonstrated similar radiographic appearance of the chest dating back to 03/01/2018, and findings may be chronic. 3. Colonic diverticulosis without diverticulitis. 4. Incidental 5 mm cyst in the proximal pancreatic body. Recommend follow up pre and post contrast MRI/MRCP or pancreatic protocol CT in 2 years. This recommendation follows ACR consensus  guidelines: Management of Incidental Pancreatic Cysts: A White Paper of the ACR Incidental Findings  Committee. J Am Coll Radiol 1540;[id number]:[id number]. 5.  Advanced Aortic atherosclerosis (ICD10-I70.0). Electronically Signed   By: Keith Rake M.D.   On: 10/27/2018 21:37   Dg Chest Port 1 View  Result Date: 10/27/2018 CLINICAL DATA:  Fever and cough EXAM: PORTABLE CHEST 1 VIEW COMPARISON:  03/01/2018 FINDINGS: Extensive chronic lung disease apical scarring bilaterally. Extensive on the right with retraction of the right hilum. Left hilar retraction also unchanged due to apical scarring. Extensive pleuroparenchymal scarring in the right lung base unchanged. Diffuse parenchymal airspace density appears chronic and unchanged. Port-A-Cath tip in the SVC. IMPRESSION: Extensive chronic lung disease bilaterally right greater than left, stable from prior studies. No superimposed acute abnormality. Electronically Signed   By: Franchot Gallo M.D.   On: 10/27/2018 18:21    Pending Labs Unresulted Labs (From admission, onward)    Start     Ordered   10/27/18 1700  Novel Coronavirus, NAA (hospital order; send-out to ref lab)  (Novel Coronavirus, NAA [facility] Order; send-out to ref lab) with precautions panel)  Once,   STAT    Question Answer Comment  Current symptoms Fever and Cough   Excluded other viral illnesses No (testing not indicated)   Patient immune status Normal      10/27/18 1659   10/27/18 1659  Urine culture  ONCE - STAT,   STAT     10/27/18 1658   10/27/18 1658  Blood Culture (routine x 2)  BLOOD CULTURE X 2,   STAT     10/27/18 1658   Signed and Held  CBC  (enoxaparin (LOVENOX)    CrCl >/= 30 ml/min)  Once,   R    Comments:  Baseline for enoxaparin therapy IF NOT ALREADY DRAWN.  Notify MD if PLT < 100 K.    Signed and Held   Signed and Held  Creatinine, serum  (enoxaparin (LOVENOX)    CrCl >/= 30 ml/min)  Once,   R    Comments:  Baseline for enoxaparin therapy IF NOT ALREADY DRAWN.     Signed and Held   Signed and Held  Creatinine, serum  (enoxaparin (LOVENOX)    CrCl >/= 30 ml/min)  Weekly,   R    Comments:  while on enoxaparin therapy    Signed and Held   Signed and Held  Basic metabolic panel  Tomorrow morning,   R     Signed and Held   Signed and Held  CBC  Tomorrow morning,   R     Signed and Held   Signed and Held  Troponin I - Now Then Q6H  Now then every 6 hours,   R     Signed and Held          Vitals/Pain Today's Vitals   10/27/18 2100 10/27/18 2130 10/27/18 2228 10/27/18 2230  BP: 94/63 109/62 (!) 101/58   Pulse: 99 (!) 103 (!) 102   Resp: (!) 23 (!) 22 (!) 29   Temp:      TempSrc:      SpO2: 98% 97% 99%   Weight:      Height:      PainSc:   2  2     Isolation Precautions Droplet and Contact precautions  Medications Medications  iopamidol (ISOVUE-300) 61 % injection 15 mL (15 mLs Oral Contrast Given 10/27/18 1910)  vancomycin (VANCOCIN) 1,250 mg in sodium chloride 0.9 % 250 mL IVPB (has no administration in time range)  ceFEPIme (MAXIPIME) 2 g in sodium chloride  0.9 % 100 mL IVPB (has no administration in time range)  ceFEPIme (MAXIPIME) 2 g in sodium chloride 0.9 % 100 mL IVPB (0 g Intravenous Stopped 10/27/18 1736)  metroNIDAZOLE (FLAGYL) IVPB 500 mg (0 mg Intravenous Stopped 10/27/18 1837)  vancomycin (VANCOCIN) IVPB 1000 mg/200 mL premix (0 mg Intravenous Stopped 10/27/18 1828)  sodium chloride 0.9 % bolus 1,000 mL (0 mLs Intravenous Stopped 10/27/18 1835)  sodium chloride 0.9 % bolus 1,000 mL (0 mLs Intravenous Stopped 10/27/18 2040)  sodium chloride 0.9 % bolus 500 mL ( Intravenous Stopped 10/27/18 1912)  acetaminophen (TYLENOL) tablet 1,000 mg (1,000 mg Oral Given 10/27/18 1724)  iohexol (OMNIPAQUE) 300 MG/ML solution 75 mL (75 mLs Intravenous Contrast Given 10/27/18 2117)    Mobility walks with person assist High fall risk   Focused Assessments Pulmonary Assessment Handoff:  Lung sounds: Bilateral Breath Sounds: Expiratory wheezes, Fine  crackles O2 Device: Nasal Cannula O2 Flow Rate (L/min): 3 L/min      R Recommendations: See Admitting Provider Note  Report given to:   Additional Notes:

## 2018-10-27 NOTE — ED Notes (Signed)
Report received from Jordan, RN

## 2018-10-27 NOTE — ED Notes (Signed)
Pt drinking oral contrast.

## 2018-10-27 NOTE — ED Notes (Signed)
Pt states she is having trouble breathing, pt o2 sat 100% on 2L. Pt placed on 3L, pt appears fatigue. MD aware. Pt audible wheezes.

## 2018-10-27 NOTE — Progress Notes (Signed)
CODE SEPSIS - PHARMACY COMMUNICATION  **Broad Spectrum Antibiotics should be administered within 1 hour of Sepsis diagnosis**  Time Code Sepsis Called/Page Received: 16:58  Antibiotics Ordered: vancomycin, cefepime  Time of 1st antibiotic administration: 17:17  Nelani Schmelzle ,PharmD Clinical Pharmacist  10/27/2018  6:14 PM

## 2018-10-27 NOTE — ED Notes (Signed)
Spouse called by this RN at this time and given update. Family verbalize understanding of pending test results and probable admission at this time. Pt given consent to give spouse information

## 2018-10-27 NOTE — Progress Notes (Addendum)
Pharmacy Antibiotic Note  Anna Mcbride is a 73 y.o. female admitted on 10/27/2018 with sepsis.  Pharmacy has been consulted for vancomycin and cefepime dosing.  Plan: Vancomycin 1000mg  loading dose Vancomycin 1250 mg IV Q 48 hrs.  Goal AUC 400-550. Expected AUC: 514 SCr used: 1.21  Cefepime 2gm every 24 hours   Height: 5\' 3"  (160 cm) Weight: 115 lb (52.2 kg) IBW/kg (Calculated) : 52.4  Temp (24hrs), Avg:100.8 F (38.2 C), Min:99.1 F (37.3 C), Max:102.5 F (39.2 C)  Recent Labs  Lab 10/27/18 1658  WBC 12.2*  CREATININE 1.21*  LATICACIDVEN 1.7    Estimated Creatinine Clearance: 33.6 mL/min (A) (by C-G formula based on SCr of 1.21 mg/dL (H)).    Allergies  Allergen Reactions  . Hydrocodone-Homatropine Itching  . Tussionex Pennkinetic Er [Hydrocod Polst-Cpm Polst Er] Itching  . Ciprofloxacin Rash    Antimicrobials this admission: 4/7 metronidazole  >>  4/7 vancomycin >>  4/7 cefepime>>  Microbiology results: 4/7 BCx: pending 4/7 UCx: pending   Thank you for allowing pharmacy to be a part of this patient's care.  [name] 10/27/2018 7:47 PM

## 2018-10-28 ENCOUNTER — Other Ambulatory Visit: Payer: Self-pay

## 2018-10-28 LAB — CBC
HCT: 39.8 % (ref 36.0–46.0)
Hemoglobin: 12.7 g/dL (ref 12.0–15.0)
MCH: 32.5 pg (ref 26.0–34.0)
MCHC: 31.9 g/dL (ref 30.0–36.0)
MCV: 101.8 fL — ABNORMAL HIGH (ref 80.0–100.0)
Platelets: 159 10*3/uL (ref 150–400)
RBC: 3.91 MIL/uL (ref 3.87–5.11)
RDW: 13.9 % (ref 11.5–15.5)
WBC: 10.8 10*3/uL — ABNORMAL HIGH (ref 4.0–10.5)
nRBC: 0 % (ref 0.0–0.2)

## 2018-10-28 LAB — BASIC METABOLIC PANEL
Anion gap: 8 (ref 5–15)
BUN: 23 mg/dL (ref 8–23)
CO2: 20 mmol/L — ABNORMAL LOW (ref 22–32)
Calcium: 8.2 mg/dL — ABNORMAL LOW (ref 8.9–10.3)
Chloride: 109 mmol/L (ref 98–111)
Creatinine, Ser: 1.05 mg/dL — ABNORMAL HIGH (ref 0.44–1.00)
GFR calc Af Amer: >60 mL/min (ref >60)
GFR calc non Af Amer: 52 mL/min — ABNORMAL LOW (ref >60)
Glucose, Bld: 88 mg/dL (ref 70–99)
Potassium: 4 mmol/L (ref 3.5–5.1)
Sodium: 137 mmol/L (ref 135–145)

## 2018-10-28 LAB — MRSA PCR SCREENING: MRSA by PCR: NEGATIVE

## 2018-10-28 LAB — TROPONIN I
Troponin I: 0.04 ng/mL (ref <0.03)
Troponin I: 0.04 ng/mL (ref <0.03)

## 2018-10-28 LAB — URINE CULTURE: Culture: NO GROWTH

## 2018-10-28 MED ORDER — LEVOTHYROXINE SODIUM 50 MCG PO TABS
25.0000 ug | ORAL_TABLET | Freq: Every day | ORAL | Status: DC
  Administered 2018-10-28 – 2018-10-31 (×4): 25 ug via ORAL
  Filled 2018-10-28 (×4): qty 1

## 2018-10-28 MED ORDER — BENZONATATE 100 MG PO CAPS
200.0000 mg | ORAL_CAPSULE | Freq: Three times a day (TID) | ORAL | Status: DC | PRN
  Filled 2018-10-28: qty 2

## 2018-10-28 MED ORDER — ENOXAPARIN SODIUM 40 MG/0.4ML ~~LOC~~ SOLN
40.0000 mg | SUBCUTANEOUS | Status: DC
  Administered 2018-10-28 – 2018-10-30 (×3): 40 mg via SUBCUTANEOUS
  Filled 2018-10-28 (×3): qty 0.4

## 2018-10-28 MED ORDER — ALPRAZOLAM 0.5 MG PO TABS
0.5000 mg | ORAL_TABLET | Freq: Every day | ORAL | Status: DC
  Administered 2018-10-28 – 2018-10-30 (×4): 0.5 mg via ORAL
  Filled 2018-10-28 (×5): qty 1

## 2018-10-28 MED ORDER — GUAIFENESIN-DM 100-10 MG/5ML PO SYRP
5.0000 mL | ORAL_SOLUTION | ORAL | Status: DC | PRN
  Administered 2018-10-29: 5 mL via ORAL
  Filled 2018-10-28 (×2): qty 5

## 2018-10-28 MED ORDER — ONDANSETRON HCL 4 MG PO TABS: 4.0000 mg | ORAL_TABLET | Freq: Four times a day (QID) | ORAL | Status: DC | PRN

## 2018-10-28 MED ORDER — ACETAMINOPHEN 325 MG PO TABS: 650.0000 mg | ORAL_TABLET | Freq: Four times a day (QID) | ORAL | Status: DC | PRN

## 2018-10-28 MED ORDER — PANTOPRAZOLE SODIUM 40 MG PO TBEC
40.0000 mg | DELAYED_RELEASE_TABLET | Freq: Every day | ORAL | Status: DC
  Administered 2018-10-28 – 2018-10-31 (×4): 40 mg via ORAL
  Filled 2018-10-28 (×4): qty 1

## 2018-10-28 MED ORDER — SODIUM CHLORIDE 0.9 % IV BOLUS
500.0000 mL | Freq: Once | INTRAVENOUS | Status: AC
  Administered 2018-10-28: 500 mL via INTRAVENOUS

## 2018-10-28 MED ORDER — CITALOPRAM HYDROBROMIDE 20 MG PO TABS
20.0000 mg | ORAL_TABLET | Freq: Every day | ORAL | Status: DC
  Administered 2018-10-28 – 2018-10-31 (×4): 20 mg via ORAL
  Filled 2018-10-28 (×4): qty 1

## 2018-10-28 MED ORDER — ONDANSETRON HCL 4 MG/2ML IJ SOLN
4.0000 mg | Freq: Four times a day (QID) | INTRAMUSCULAR | Status: DC | PRN
  Administered 2018-10-28: 21:00:00 4 mg via INTRAVENOUS
  Filled 2018-10-28: qty 2

## 2018-10-28 MED ORDER — ENOXAPARIN SODIUM 30 MG/0.3ML ~~LOC~~ SOLN
30.0000 mg | SUBCUTANEOUS | Status: DC
  Administered 2018-10-28: 02:00:00 30 mg via SUBCUTANEOUS
  Filled 2018-10-28: qty 0.3

## 2018-10-28 MED ORDER — IPRATROPIUM-ALBUTEROL 20-100 MCG/ACT IN AERS
1.0000 | INHALATION_SPRAY | Freq: Four times a day (QID) | RESPIRATORY_TRACT | Status: DC | PRN
  Administered 2018-10-28 – 2018-10-29 (×2): 1 via RESPIRATORY_TRACT
  Filled 2018-10-28 (×2): qty 4

## 2018-10-28 MED ORDER — SODIUM CHLORIDE 0.9 % IV SOLN
INTRAVENOUS | Status: AC
  Administered 2018-10-28: 01:00:00 via INTRAVENOUS

## 2018-10-28 MED ORDER — ACETAMINOPHEN 650 MG RE SUPP: 650.0000 mg | Freq: Four times a day (QID) | RECTAL | Status: DC | PRN

## 2018-10-28 NOTE — Progress Notes (Signed)
PHARMACIST - PHYSICIAN COMMUNICATION  CONCERNING:  Enoxaparin (Lovenox) for DVT Prophylaxis    RECOMMENDATION: Patient was prescribed enoxaprin 30mg  q24 hours for VTE prophylaxis.   Filed Weights   10/27/18 1702 10/28/18 0135  Weight: 115 lb (52.2 kg) 125 lb 1.6 oz (56.7 kg)    Body mass index is 23.64 kg/m.  Estimated Creatinine Clearance: 35.5 mL/min (A) (by C-G formula based on SCr of 1.05 mg/dL (H)).   Patient is candidate for enoxaparin 40 mg every 24 hours based on CrCl >37ml/min  DESCRIPTION: Pharmacy has adjusted enoxaparin dose per [facility] policy.  Patient is now receiving enoxaparin 40mg  every 24 hours.  Rowland Lathe, PharmD Clinical Pharmacist  10/28/2018 10:47 AM

## 2018-10-28 NOTE — Progress Notes (Signed)
Patient was transferred from ED following c/o SOB and non-productive cough. On admission patient was A&O X4, and was on 3L of supplemental  Oxygen via [place]. Patient was placed on negative pressure room pending covid19 result. Patient denied being in pain. Patient has no acute event overnight, she rested for most of the night, BP WDL, NSR on the monitor.

## 2018-10-28 NOTE — Plan of Care (Signed)

## 2018-10-28 NOTE — Plan of Care (Signed)
  Problem: Health Behavior/Discharge Planning: Goal: Ability to manage health-related needs will improve Outcome: Not Progressing Note:  Patient being r/o for COVID-19. Results pending. Will continue to monitor. Wenda Low [facility]

## 2018-10-28 NOTE — ED Notes (Signed)
Attempted to call report on pt, RN unable to take report at this time.

## 2018-10-28 NOTE — Progress Notes (Signed)
[place] [facility] [place] NAME: Anna Mcbride    MR#:  [id number]  DATE OF BIRTH:  07-20-1946  SUBJECTIVE:  CHIEF COMPLAINT:   Chief Complaint  Patient presents with   Weakness   Better shortness of breath and the cough.  On oxygen by nasal cannula 3.5 L. REVIEW OF SYSTEMS:  Review of Systems  Constitutional: Positive for malaise/fatigue. Negative for chills and fever.  HENT: Negative for sore throat.   Eyes: Negative for blurred vision and double vision.  Respiratory: Positive for cough and shortness of breath. Negative for hemoptysis, sputum production, wheezing and stridor.   Cardiovascular: Negative for chest pain, palpitations, orthopnea and leg swelling.  Gastrointestinal: Negative for abdominal pain, blood in stool, diarrhea, melena, nausea and vomiting.  Genitourinary: Negative for dysuria, flank pain and hematuria.  Musculoskeletal: Negative for back pain and joint pain.  Neurological: Negative for dizziness, sensory change, focal weakness, seizures, loss of consciousness, weakness and headaches.  Endo/Heme/Allergies: Negative for polydipsia.  Psychiatric/Behavioral: Negative for depression. The patient is not nervous/anxious.     DRUG ALLERGIES:   Allergies  Allergen Reactions   Hydrocodone-Homatropine Itching   Tussionex Pennkinetic Er [Hydrocod Polst-Cpm Polst Er] Itching   Ciprofloxacin Rash   VITALS:  Blood pressure 95/64, pulse (!) 116, temperature 99.2 F (37.3 C), temperature source Oral, resp. rate 16, height 5\' 1"  (1.549 m), weight 56.7 kg, SpO2 96 %. PHYSICAL EXAMINATION:  Physical Exam Constitutional:      General: She is not in acute distress.    Appearance: Normal appearance.  HENT:     Head: Normocephalic.     Mouth/Throat:     Mouth: Mucous membranes are moist.  Eyes:     General: No scleral icterus.    Conjunctiva/sclera: Conjunctivae normal.     Pupils: Pupils are equal, round, and reactive to  light.  Neck:     Musculoskeletal: Normal range of motion and neck supple.     Vascular: No JVD.     Trachea: No tracheal deviation.  Cardiovascular:     Rate and Rhythm: Normal rate and regular rhythm.     Heart sounds: Normal heart sounds. No murmur. No gallop.   Pulmonary:     Effort: Pulmonary effort is normal. No respiratory distress.     Breath sounds: Normal breath sounds. No wheezing or rales.  Abdominal:     General: Bowel sounds are normal. There is no distension.     Palpations: Abdomen is soft.     Tenderness: There is no abdominal tenderness. There is no rebound.  Musculoskeletal: Normal range of motion.        General: No tenderness.     Right lower leg: No edema.     Left lower leg: No edema.  Skin:    Findings: No erythema or rash.  Neurological:     General: No focal deficit present.     Mental Status: She is alert and oriented to person, place, and time.     Cranial Nerves: No cranial nerve deficit.  Psychiatric:        Mood and Affect: Mood normal.    LABORATORY PANEL:  Female CBC Recent Labs  Lab 10/28/18 0159  WBC 10.8*  HGB 12.7  HCT 39.8  PLT 159   ------------------------------------------------------------------------------------------------------------------ Chemistries  Recent Labs  Lab 10/27/18 1658 10/28/18 0159  NA 134* 137  K 3.4* 4.0  CL 97* 109  CO2 25 20*  GLUCOSE 100* 88  BUN  29* 23  CREATININE 1.21* 1.05*  CALCIUM 9.2 8.2*  AST 18  --   ALT 15  --   ALKPHOS 50  --   BILITOT 1.0  --    RADIOLOGY:  Ct Abdomen Pelvis W Contrast  Result Date: 10/27/2018 CLINICAL DATA:  Right-sided abdominal and flank pain. Acute abdominal pain. EXAM: CT ABDOMEN AND PELVIS WITH CONTRAST TECHNIQUE: Multidetector CT imaging of the abdomen and pelvis was performed using the standard protocol following bolus administration of intravenous contrast. CONTRAST:  68mL OMNIPAQUE IOHEXOL 300 MG/ML  SOLN COMPARISON:  Abdominal CT 12/19/2017. Chest  radiograph earlier this day as well as 03/01/2018 FINDINGS: Motion artifact limits assessment of the chest, abdomen, and pelvis. Lower chest: Small layering left pleural effusion. Moderate right pleural effusion is partially loculated anteriorly. Vague geographic ground-glass opacities in the left lung base which are partially obscured by motion. Hepatobiliary: Slight heterogeneous enhancement. Small cyst in the right lobe is unchanged from prior exam. Gallbladder physiologically distended, no calcified stone. No biliary dilatation. Pancreas: Motion artifact. No ductal dilatation or inflammation. There is a 5 mm cyst in the proximal pancreatic body, image 32 series 2. Spleen: Normal in size without gross abnormality, motion obscured. Adrenals/Urinary Tract: Normal adrenal glands. Right kidney is slightly low in positioning. No hydronephrosis or perinephric edema. Slight delayed excretion on delayed phase imaging. Motion artifact limits detailed assessment. Urinary bladder is physiologically distended. No bladder wall thickening. Stomach/Bowel: Small hiatal hernia. Stomach grossly unremarkable. No bowel obstruction. Enteric contrast reaches the colon. Diverticulosis from the mid transverse colon distally. Diverticular prominent in the sigmoid colon. No evidence of diverticulitis. Air in stool distends the rectum. No rectal wall thickening. No obvious bowel inflammation. Appendix not confidently visualized, no evidence of appendicitis. Vascular/Lymphatic: Advanced aortic atherosclerosis. IVC appears prominent. Moderate branch atherosclerosis. No acute vascular finding. Portal vein appears patent. No definite enlarged lymph nodes in the abdomen or pelvis. Reproductive: Uterus and bilateral adnexa are unremarkable. Ovaries are quiescent, normal for age. Other: No ascites or free air. No evidence of intra-abdominal abscess. Musculoskeletal: There are no acute or suspicious osseous abnormalities. Degenerative change in  the lower lumbar spine. IMPRESSION: 1. Slight heterogeneity of the liver is nonspecific, but may be due to elevated right heart pressures given prominent IVC. No other acute finding in the abdomen or pelvis. Motion artifact slightly limits detailed assessment. 2. Bilateral pleural effusions, small on the left and moderate on the right and partially loculated. Chest x-ray earlier today demonstrated similar radiographic appearance of the chest dating back to 03/01/2018, and findings may be chronic. 3. Colonic diverticulosis without diverticulitis. 4. Incidental 5 mm cyst in the proximal pancreatic body. Recommend follow up pre and post contrast MRI/MRCP or pancreatic protocol CT in 2 years. This recommendation follows ACR consensus guidelines: Management of Incidental Pancreatic Cysts: A White Paper of the ACR Incidental Findings Committee. J Am Coll Radiol 2536;64:403-474. 5.  Advanced Aortic atherosclerosis (ICD10-I70.0). Electronically Signed   By: Keith Rake M.D.   On: 10/27/2018 21:37   Dg Chest Port 1 View  Result Date: 10/27/2018 CLINICAL DATA:  Fever and cough EXAM: PORTABLE CHEST 1 VIEW COMPARISON:  03/01/2018 FINDINGS: Extensive chronic lung disease apical scarring bilaterally. Extensive on the right with retraction of the right hilum. Left hilar retraction also unchanged due to apical scarring. Extensive pleuroparenchymal scarring in the right lung base unchanged. Diffuse parenchymal airspace density appears chronic and unchanged. Port-A-Cath tip in the SVC. IMPRESSION: Extensive chronic lung disease bilaterally right greater than left, stable  from prior studies. No superimposed acute abnormality. Electronically Signed   By: Franchot Gallo M.D.   On: 10/27/2018 18:21   ASSESSMENT AND PLAN:   Severe sepsis due to pneumonia CAP (community acquired pneumonia) - Continue cefepime IV, discontinue vancomycin, MRSA screen is negative.  Robitussin as needed.   Suspected Covid-19 Virus Infection -test  pending   HLD (hyperlipidemia) -home dose antilipid   Adult hypothyroidism -home dose thyroid replacement   GERD (gastroesophageal reflux disease) -home dose PPI   CKD (chronic kidney disease), stage III (HCC) -at baseline, avoid nephrotoxins and monitor. History of asthma.  Stable. History of lymphoma.  Follow-up oncologist as outpatient. Chronic bilateral pleural effusion.  Stable. Incidental 5 mm cyst in the proximal pancreatic body. Recommend follow up pre and post contrast MRI/MRCP or pancreatic protocol CT in 2 years.  Dehydration improving. Hypokalemia.  Improved. Hyponatremia.  Improved.  All the records are reviewed and case discussed with Care Management/Social Worker. Management plans discussed with the patient, family and they are in agreement.  CODE STATUS: Full Code  TOTAL TIME TAKING CARE OF THIS PATIENT: 36 minutes.   More than 50% of the time was spent in counseling/coordination of care: YES  POSSIBLE D/C IN 2 DAYS, DEPENDING ON CLINICAL CONDITION.   Demetrios Loll M.D on 10/28/2018 at 5:14 PM  Between 7am to 6pm - Pager - [phone]  After 6pm go to [email or web address] - [facility]

## 2018-10-29 ENCOUNTER — Inpatient Hospital Stay: Payer: Medicare Other

## 2018-10-29 DIAGNOSIS — I4891 Unspecified atrial fibrillation: Secondary | ICD-10-CM

## 2018-10-29 DIAGNOSIS — R6520 Severe sepsis without septic shock: Secondary | ICD-10-CM

## 2018-10-29 DIAGNOSIS — R000 Tachycardia, unspecified: Secondary | ICD-10-CM

## 2018-10-29 DIAGNOSIS — A419 Sepsis, unspecified organism: Principal | ICD-10-CM

## 2018-10-29 LAB — CBC WITH DIFFERENTIAL/PLATELET
Abs Immature Granulocytes: 0.03 10*3/uL (ref 0.00–0.07)
Basophils Absolute: 0 10*3/uL (ref 0.0–0.1)
Basophils Relative: 0 %
Eosinophils Absolute: 0.1 10*3/uL (ref 0.0–0.5)
Eosinophils Relative: 1 %
HCT: 38.7 % (ref 36.0–46.0)
Hemoglobin: 12.5 g/dL (ref 12.0–15.0)
Immature Granulocytes: 0 %
Lymphocytes Relative: 19 %
Lymphs Abs: 1.7 10*3/uL (ref 0.7–4.0)
MCH: 32.7 pg (ref 26.0–34.0)
MCHC: 32.3 g/dL (ref 30.0–36.0)
MCV: 101.3 fL — ABNORMAL HIGH (ref 80.0–100.0)
Monocytes Absolute: 1.1 10*3/uL — ABNORMAL HIGH (ref 0.1–1.0)
Monocytes Relative: 13 %
Neutro Abs: 6 10*3/uL (ref 1.7–7.7)
Neutrophils Relative %: 67 %
Platelets: 162 10*3/uL (ref 150–400)
RBC: 3.82 MIL/uL — ABNORMAL LOW (ref 3.87–5.11)
RDW: 13.6 % (ref 11.5–15.5)
WBC: 9 10*3/uL (ref 4.0–10.5)
nRBC: 0 % (ref 0.0–0.2)

## 2018-10-29 LAB — BLOOD GAS, ARTERIAL
Acid-base deficit: 3 mmol/L — ABNORMAL HIGH (ref 0.0–2.0)
Bicarbonate: 21.1 mmol/L (ref 20.0–28.0)
FIO2: 1
O2 Saturation: 99.9 %
Patient temperature: 37
pCO2 arterial: 34 mmHg (ref 32.0–48.0)
pH, Arterial: 7.4 (ref 7.350–7.450)
pO2, Arterial: 329 mmHg — ABNORMAL HIGH (ref 83.0–108.0)

## 2018-10-29 LAB — BRAIN NATRIURETIC PEPTIDE: B Natriuretic Peptide: 744 pg/mL — ABNORMAL HIGH (ref 0.0–100.0)

## 2018-10-29 LAB — TSH: TSH: 2.278 u[IU]/mL (ref 0.350–4.500)

## 2018-10-29 LAB — MAGNESIUM: Magnesium: 1.9 mg/dL (ref 1.7–2.4)

## 2018-10-29 LAB — GLUCOSE, CAPILLARY: Glucose-Capillary: 119 mg/dL — ABNORMAL HIGH (ref 70–99)

## 2018-10-29 LAB — PROCALCITONIN: Procalcitonin: 2.95 ng/mL

## 2018-10-29 LAB — POTASSIUM: Potassium: 3.1 mmol/L — ABNORMAL LOW (ref 3.5–5.1)

## 2018-10-29 MED ORDER — METHYLPREDNISOLONE SODIUM SUCC 125 MG IJ SOLR
INTRAMUSCULAR | Status: AC
  Administered 2018-10-29: 125 mg via INTRAVENOUS
  Filled 2018-10-29: qty 2

## 2018-10-29 MED ORDER — LEVALBUTEROL TARTRATE 45 MCG/ACT IN AERO: 2.0000 | INHALATION_SPRAY | Freq: Four times a day (QID) | RESPIRATORY_TRACT | Status: DC

## 2018-10-29 MED ORDER — METHYLPREDNISOLONE SODIUM SUCC 125 MG IJ SOLR
125.0000 mg | Freq: Once | INTRAMUSCULAR | Status: AC
  Administered 2018-10-29: 22:00:00 125 mg via INTRAVENOUS

## 2018-10-29 MED ORDER — IPRATROPIUM BROMIDE HFA 17 MCG/ACT IN AERS
2.0000 | INHALATION_SPRAY | RESPIRATORY_TRACT | Status: DC
  Filled 2018-10-29: qty 12.9

## 2018-10-29 MED ORDER — AMIODARONE LOAD VIA INFUSION
150.0000 mg | Freq: Once | INTRAVENOUS | Status: AC
  Administered 2018-10-29 (×2): 150 mg via INTRAVENOUS
  Filled 2018-10-29: qty 83.34

## 2018-10-29 MED ORDER — POTASSIUM CHLORIDE CRYS ER 20 MEQ PO TBCR
40.0000 meq | EXTENDED_RELEASE_TABLET | Freq: Two times a day (BID) | ORAL | Status: AC
  Administered 2018-10-29 (×2): 40 meq via ORAL
  Filled 2018-10-29 (×2): qty 2

## 2018-10-29 MED ORDER — ENSURE ENLIVE PO LIQD
237.0000 mL | Freq: Two times a day (BID) | ORAL | Status: DC
  Administered 2018-10-30 – 2018-10-31 (×2): 237 mL via ORAL

## 2018-10-29 MED ORDER — DILTIAZEM HCL 25 MG/5ML IV SOLN
10.0000 mg | Freq: Once | INTRAVENOUS | Status: AC
  Administered 2018-10-29: 10 mg via INTRAVENOUS

## 2018-10-29 MED ORDER — AMIODARONE HCL IN DEXTROSE 360-4.14 MG/200ML-% IV SOLN
30.0000 mg/h | INTRAVENOUS | Status: DC
  Administered 2018-10-30 (×2): 30 mg/h via INTRAVENOUS
  Filled 2018-10-29: qty 200

## 2018-10-29 MED ORDER — DILTIAZEM HCL 100 MG IV SOLR
5.0000 mg/h | INTRAVENOUS | Status: DC
  Administered 2018-10-29: 5 mg/h via INTRAVENOUS
  Filled 2018-10-29: qty 100

## 2018-10-29 MED ORDER — SODIUM CHLORIDE 0.9 % IV BOLUS
500.0000 mL | Freq: Once | INTRAVENOUS | Status: AC
  Administered 2018-10-29: 22:00:00 500 mL via INTRAVENOUS

## 2018-10-29 MED ORDER — AMIODARONE HCL IN DEXTROSE 360-4.14 MG/200ML-% IV SOLN
INTRAVENOUS | Status: AC
  Administered 2018-10-29: 21:00:00 60 mg/h via INTRAVENOUS
  Filled 2018-10-29: qty 200

## 2018-10-29 MED ORDER — METHYLPREDNISOLONE SODIUM SUCC 125 MG IJ SOLR: 80.0000 mg | Freq: Once | INTRAMUSCULAR | Status: DC

## 2018-10-29 MED ORDER — AMIODARONE HCL IN DEXTROSE 360-4.14 MG/200ML-% IV SOLN
60.0000 mg/h | INTRAVENOUS | Status: DC
  Administered 2018-10-29: 21:00:00 60 mg/h via INTRAVENOUS
  Filled 2018-10-29: qty 200

## 2018-10-29 MED ORDER — DILTIAZEM HCL 25 MG/5ML IV SOLN
10.0000 mg | Freq: Once | INTRAVENOUS | Status: DC
  Filled 2018-10-29: qty 5

## 2018-10-29 MED ORDER — AMIODARONE HCL IN DEXTROSE 360-4.14 MG/200ML-% IV SOLN
INTRAVENOUS | Status: AC
  Filled 2018-10-29: qty 200

## 2018-10-29 NOTE — Progress Notes (Signed)
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 Infuenza negative. Transferred to the ICU overnight due to increased work of breathing now on hiflo O2. She also went into afib RVR.  eICU Interventions   Rate control with cardizem, also on amiodarone  Continue antibiotics  Agree with steroids given alternative etiology for respiratory failure     Intervention Category Major Interventions: Respiratory failure - evaluation and management Intermediate Interventions: Arrhythmia - evaluation and management;Infection - evaluation and management Evaluation Type: New Patient Evaluation  Judd Lien 10/29/2018, 9:10 PM

## 2018-10-29 NOTE — Progress Notes (Signed)
RT X2 responeded to [facility]. Patient placed on NRB mask. ABG obtained per MD order. Patient transported to ICU on COVID precautions.

## 2018-10-29 NOTE — Progress Notes (Signed)
Patient found on the floor with urine and stool after I entered the room to deliver the patient a vanilla Ensure and her meal tray. States she "fell" off the B.S.C. after getting up by herself, without calling out for assistance (for unknown reasons) and at some point took her high fall risk socks off herself. Patient states she has absolutely no pain, no visible injuries observed. I cleaned up the floor, put on a new gown, cleaned the patient's feet, the patient herself, and got the patient safely back to bed and repositioned with the bed alarm reactivated. Upon discussing with the NT, she had worked with the patient a short time ago, getting her vital signs and taking her to and from the B.S.C., however, the bed alarm wasn't reset. Her B.P. cuff was also still attached. Patient alert and oriented, yet very weak.  Will inform physician and family, and complete post-fall flowsheet. Unit director and A.D. informed in person of event. Wenda Low [facility]

## 2018-10-29 NOTE — Progress Notes (Addendum)
[place] at [place] NAME: Anna Mcbride    MR#:  [id number]  DATE OF BIRTH:  1946/05/28  SUBJECTIVE:  CHIEF COMPLAINT:   Chief Complaint  Patient presents with  . Weakness   The patient still has cough and shortness of breath.  She feels weak and has poor oral intake.  She has tachycardia.  On oxygen by nasal cannula 3 L. REVIEW OF SYSTEMS:  Review of Systems  Constitutional: Positive for malaise/fatigue. Negative for chills and fever.  HENT: Negative for sore throat.   Eyes: Negative for blurred vision and double vision.  Respiratory: Positive for cough and shortness of breath. Negative for hemoptysis, sputum production, wheezing and stridor.   Cardiovascular: Negative for chest pain, palpitations, orthopnea and leg swelling.  Gastrointestinal: Negative for abdominal pain, blood in stool, diarrhea, melena, nausea and vomiting.  Genitourinary: Negative for dysuria, flank pain and hematuria.  Musculoskeletal: Negative for back pain and joint pain.  Skin: Negative for rash.  Neurological: Negative for dizziness, sensory change, focal weakness, seizures, loss of consciousness, weakness and headaches.  Endo/Heme/Allergies: Negative for polydipsia.  Psychiatric/Behavioral: Negative for depression. The patient is not nervous/anxious.     DRUG ALLERGIES:   Allergies  Allergen Reactions  . Hydrocodone-Homatropine Itching  . Tussionex Pennkinetic Er [Hydrocod Polst-Cpm Polst Er] Itching  . Ciprofloxacin Rash   VITALS:  Blood pressure 108/78, pulse (!) 118, temperature 99.2 F (37.3 C), temperature source Oral, resp. rate (!) 25, height 5\' 1"  (1.549 m), weight 56.7 kg, SpO2 95 %. PHYSICAL EXAMINATION:  Physical Exam Constitutional:      General: She is not in acute distress.    Appearance: Normal appearance.  HENT:     Head: Normocephalic.     Mouth/Throat:     Mouth: Mucous membranes are moist.  Eyes:     General: No scleral  icterus.    Conjunctiva/sclera: Conjunctivae normal.     Pupils: Pupils are equal, round, and reactive to light.  Neck:     Musculoskeletal: Normal range of motion and neck supple.     Vascular: No JVD.     Trachea: No tracheal deviation.  Cardiovascular:     Rate and Rhythm: Regular rhythm. Tachycardia present.     Heart sounds: Normal heart sounds. No murmur. No gallop.   Pulmonary:     Effort: Pulmonary effort is normal. No respiratory distress.     Breath sounds: Normal breath sounds. No wheezing or rales.  Abdominal:     General: Bowel sounds are normal. There is no distension.     Palpations: Abdomen is soft.     Tenderness: There is no abdominal tenderness. There is no rebound.  Musculoskeletal: Normal range of motion.        General: No tenderness.     Right lower leg: No edema.     Left lower leg: No edema.  Skin:    Findings: No erythema or rash.  Neurological:     General: No focal deficit present.     Mental Status: She is alert and oriented to person, place, and time.     Cranial Nerves: No cranial nerve deficit.  Psychiatric:        Mood and Affect: Mood normal.    LABORATORY PANEL:  Female CBC Recent Labs  Lab 10/28/18 0159  WBC 10.8*  HGB 12.7  HCT 39.8  PLT 159   ------------------------------------------------------------------------------------------------------------------ Chemistries  Recent Labs  Lab 10/27/18 1658 10/28/18 0159 10/29/18 2951  NA 134* 137  --   K 3.4* 4.0 3.1*  CL 97* 109  --   CO2 25 20*  --   GLUCOSE 100* 88  --   BUN 29* 23  --   CREATININE 1.21* 1.05*  --   CALCIUM 9.2 8.2*  --   MG  --   --  1.9  AST 18  --   --   ALT 15  --   --   ALKPHOS 50  --   --   BILITOT 1.0  --   --    RADIOLOGY:  No results found. ASSESSMENT AND PLAN:   Severe sepsis due to pneumonia CAP (community acquired pneumonia) - Continue cefepime IV, discontinued vancomycin, MRSA screen is negative.  Robitussin as needed.   Suspected  Covid-19 Virus Infection -test pending  Acute respiratory failure with hypoxia due to above. Try to wean down oxygen by nasal cannula, inhaler as needed.  Robitussin as needed.    HLD (hyperlipidemia) -home dose antilipid   Adult hypothyroidism -home dose thyroid replacement   GERD (gastroesophageal reflux disease) -home dose PPI   CKD (chronic kidney disease), stage III (HCC) -at baseline, avoid nephrotoxins and monitor. History of asthma.  Stable. History of lymphoma.  Follow-up oncologist as outpatient. Chronic bilateral pleural effusion.  Stable. Incidental 5 mm cyst in the proximal pancreatic body. Recommend follow up pre and post contrast MRI/MRCP or pancreatic protocol CT in 2 years.  Dehydration improving. Hypokalemia.  Potassium supplement and follow-up level. Hyponatremia.  Improved.  All the records are reviewed and case discussed with Care Management/Social Worker. Management plans discussed with the patient, her husband and they are in agreement.  CODE STATUS: Full Code  TOTAL TIME TAKING CARE OF THIS PATIENT: 35 minutes.   More than 50% of the time was spent in counseling/coordination of care: YES  POSSIBLE D/C IN 2-3 DAYS, DEPENDING ON CLINICAL CONDITION.   Demetrios Loll M.D on 10/29/2018 at 4:04 PM  Between 7am to 6pm - Pager - [phone]  After 6pm go to [email or web address] - [employer] [facility]

## 2018-10-29 NOTE — Progress Notes (Signed)
   10/29/18 2000  Clinical Encounter Type  Visited With Patient;Health care provider  Visit Type Code  Pt coded for RRT. No immediate need for the ch.

## 2018-10-29 NOTE — Consult Note (Signed)
Name: Anna Mcbride MRN: [id number] DOB: 06/08/1946    ADMISSION DATE:  10/27/2018 CONSULTATION DATE:  10/29/2018   REFERRING MD :  Dr. Posey Pronto  CHIEF COMPLAINT:  A-fib w/ RVR, Acute Respiratory Distress  BRIEF PATIENT DESCRIPTION:  73 y.o. 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  She is a COVID-19 rule out.  On 4/9 she developed A-fib w/ RVR and Acute Respiratory Distress due to suspected Exacerbation of COPD and Pulmonary Fibrosis, requiring transfer to [facility] for HFNC.  Her ferritin, fibrinogen, and FDPs are significant elevated, concerning for developing cytokine storm.  SIGNIFICANT EVENTS  10/27/18>> Admission to [facility] Med-surg unit 10/29/18>> Transfer to Stepdown due to A-fib w/ RVR and Acute Respiratory Distress  STUDIES:  CT Abdomen & Pelvis 10/27/18>> Slight heterogeneity of the liver is nonspecific, but may be due to elevated right heart pressures given prominent IVC. No other acute finding in the abdomen or pelvis. Motion artifact slightly limits detailed assessment. 2. Bilateral pleural effusions, small on the left and moderate on the right and partially loculated. Chest x-ray earlier today demonstrated similar radiographic appearance of the chest dating back to 03/01/2018, and findings may be chronic. 3. Colonic diverticulosis without diverticulitis. 4. Incidental 5 mm cyst in the proximal pancreatic body.  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  Chest x-ray was concerning for pneumonia along with chronic pleural effusions.  She was admitted to the [place] unit for treatment of acute hypoxic respiratory failure and sepsis secondary to community-acquired pneumonia.  She is a COVID-19 rule out.  On 10/29/2018 she developed acute respiratory distress and atrial fibrillation with RVR requiring transfer to ICU.  She was placed on amiodarone and Cardizem infusions, and requires high flow nasal cannula.  She received IV steroids with noted improvement in her respiratory effort.  Suspect her acute respiratory distress is secondary to exacerbation of her COPD and pulmonary fibrosis.  Also, ferritin, fibrinogen, FDP's are significantly elevated concerning for cytokine storm.  PCCM is consulted for further management.  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 Prior to Admission medications   Medication Sig Start Date End Date Taking? Authorizing Provider  acetaminophen (TYLENOL) 325 MG tablet Take 2 tablets (650 mg total) by mouth every 6 (six) hours as needed for mild pain (headache). 01/08/18  Yes Gouru, Aruna, MD  albuterol (PROVENTIL HFA;VENTOLIN HFA) 108 (90 BASE) MCG/ACT inhaler Inhale 1 puff into the lungs every 6 (six) hours as needed for wheezing or shortness of breath.   Yes  [provider]  ALPRAZolam Duanne Moron) 0.5 MG tablet Take 1 tablet (0.5 mg total) by mouth at bedtime. 01/08/18  Yes Gouru, Aruna, MD  citalopram (CELEXA) 20 MG tablet TAKE 1 TABLET BY MOUTH ONCE DAILY 12/16/17  Yes Cammie Sickle, MD  feeding supplement, ENSURE ENLIVE, (ENSURE ENLIVE) LIQD Take 237 mLs by mouth 3 (three) times daily between meals. 01/08/18  Yes Gouru, Illene Silver, MD  furosemide (LASIX) 20 MG tablet Take 1 tablet (20 mg total) by mouth daily. 03/04/18  Yes Flora Lipps, MD  guaiFENesin (MUCINEX) 600 MG 12 hr tablet Take 600 mg by mouth daily.    Yes [provider]  ipratropium-albuterol (DUONEB) 0.5-2.5 (3) MG/3ML SOLN Take 3 mLs by nebulization every 6 (six) hours. 01/08/18  Yes Gouru, Aruna, MD  levothyroxine (SYNTHROID, LEVOTHROID) 25 MCG tablet TAKE 1 TABLET BY MOUTH ONCE DAILY ON AN EMPTY STOMACH. WAIT 30 MINUTES BEFORE TAKING OTHER MEDS. 12/03/17  Yes Cammie Sickle, MD  Multiple Vitamin (MULTIVITAMIN WITH MINERALS) TABS tablet Take 1 tablet by mouth daily. 01/09/18  Yes Gouru, Illene Silver, MD  Multiple Vitamins-Minerals (CENTRUM ADULTS PO) Take 1 tablet by mouth daily. 01/09/18  Yes [provider]  omeprazole (PRILOSEC) 20 MG capsule TAKE 1 CAPSULE BY MOUTH TWICE DAILY BEFORE MEALS 07/07/18  Yes Cammie Sickle, MD  potassium chloride SA (K-DUR,KLOR-CON) 20 MEQ tablet Take 1 tablet (20 mEq total) by mouth daily. 10/27/17  Yes Cammie Sickle, MD  predniSONE (DELTASONE) 10 MG tablet Take 1 tablet by mouth daily. 02/25/18  Yes [provider]  Respiratory Therapy Supplies (FLUTTER) DEVI Use 10-15 times daily 07/29/16  Yes Kasa, Maretta Bees, MD  vitamin C (ASCORBIC ACID) 500 MG tablet Take 1,000 mg by mouth daily.    Yes [provider]   Allergies  Allergen Reactions  . Hydrocodone-Homatropine Itching  . Tussionex Pennkinetic Er [Hydrocod Polst-Cpm Polst Er] Itching  . Ciprofloxacin Rash    FAMILY HISTORY:  family history includes  Asthma in her son; Congestive Heart Failure in her mother; Diabetes in her brother; Pulmonary embolism in her father. SOCIAL HISTORY:  reports that she has never smoked. She has never used smokeless tobacco. She reports previous alcohol use. She reports that she does not use drugs.   COVID-19 DISASTER DECLARATION:  FULL CONTACT PHYSICAL EXAMINATION WAS NOT POSSIBLE DUE TO TREATMENT OF COVID-19 AND  CONSERVATION OF PERSONAL PROTECTIVE EQUIPMENT, LIMITED EXAM FINDINGS INCLUDE-  Patient assessed or the symptoms described in the history of present illness.  In the context of the Global COVID-19 pandemic, which necessitated consideration that the patient might be at risk for infection with the SARS-CoV-2 virus that causes COVID-19, Institutional protocols and algorithms that pertain to the evaluation of patients at risk for COVID-19 are in a state of rapid change based on information released by regulatory bodies including the CDC and federal and state organizations. These policies and algorithms were followed during the patient's care while in hospital.  REVIEW OF SYSTEMS:  Positives in BOLD Constitutional: Negative for fever, chills, weight loss, +malaise/fatigue and diaphoresis.  HENT: Negative for hearing loss, ear pain, nosebleeds, congestion, sore throat, neck pain, tinnitus and ear discharge.   Eyes: Negative for blurred vision, double vision, photophobia, pain, discharge and redness.  Respiratory: Negative for cough, hemoptysis, sputum production, +shortness of breath, +wheezing and stridor.   Cardiovascular: Negative for chest pain, palpitations, orthopnea, claudication, leg swelling and PND.  Gastrointestinal: Negative for heartburn, nausea, vomiting, abdominal pain, diarrhea, constipation, blood in stool and melena.  Genitourinary: Negative for dysuria, urgency, frequency, hematuria and flank pain.  Musculoskeletal: Negative for myalgias, back pain, joint pain and falls.  Skin: Negative  for itching and rash.  Neurological: Negative for dizziness, tingling, tremors, sensory change, speech change, focal weakness, seizures, loss of consciousness, weakness and headaches.  Endo/Heme/Allergies: Negative for environmental allergies and polydipsia. Does not bruise/bleed easily.  SUBJECTIVE:  Pt reports she is short of breath, and feels like she may be wheezing, also reports palpitations Denies chest pain, cough, fever/chills On HFNC  VITAL SIGNS: Temp:  [98.4 F (36.9 C)-99.9 F (37.7 C)] 99.5 F (37.5 C) (04/09 2100) Pulse Rate:  [50-192] 155 (04/09 2130) Resp:  [20-47] 47 (04/09 2130) BP: (92-150)/(59-137) 92/61 (04/09 2130) SpO2:  [88 %-100 %] 100 % (04/09 2130) FiO2 (%):  [60 %] 60 % (04/09 2047) Weight:  [54.9 kg] 54.9 kg (04/09 2100)  PHYSICAL EXAMINATION: General:  Acutely ill appearing female, sitting in bed, on HFNC, with moderate respiratory distress Neuro:  Awake, A&O x4, follows commands, no focal deficits, speech clear HEENT:  Atraumatic, normocephalic, neck supple, no JVD Cardiovascular:  Tachycardia, irregularly irregular rhythm Lungs:  Unable to auscultate clearly given PAPR, tachypnea, increased work of breathing and assessory muscle use Abdomen:  Soft, nontender, nondistended, no guarding or rebound tenderness Musculoskeletal:  Generalized weakness, no deformities, no edema Skin:  Warm/dry.  No obvious rashes, lesions, or ulcerations  Recent Labs  Lab 10/27/18 1658 10/28/18 0159 10/29/18 0618  NA 134* 137  --   K 3.4* 4.0 3.1*  CL 97* 109  --   CO2 25 20*  --   BUN 29* 23  --   CREATININE 1.21* 1.05*  --   GLUCOSE 100* 88  --    Recent Labs  Lab 10/27/18 1658 10/28/18 0159  HGB 13.2 12.7  HCT 41.4 39.8  WBC 12.2* 10.8*  PLT 148* 159   Dg Chest Port 1 View  Result Date: 10/29/2018 CLINICAL DATA:  Respiratory distress EXAM: PORTABLE CHEST 1 VIEW COMPARISON:  10/27/2018 FINDINGS: Left chest wall Port-A-Cath tip projects over the lower  SVC, unchanged. Unchanged size of right pleural effusion. Increased left basilar atelectasis. Upper right chest opacities are unchanged. IMPRESSION: Slightly increased left basilar atelectasis but otherwise unchanged appearance of chronic lung disease. Electronically Signed   By: Ulyses Jarred M.D.   On: 10/29/2018 20:57    ASSESSMENT / PLAN:  Acute Hypoxic Respiratory Failure in setting of CAP, A-fib w/ RVR, +/- Exacerbation of COPD and Pulmonary Fibrosis & Possible COVID-19 -Supplemental O2 as needed to maintain O2 sats 88 to 94% -HFNC, wean as tolerated -Follow intermittent CXR & ABG>>> CXR 4/9 grossly unchanged, continues to show chronic lung disease and chronic pleural effusions; ABG normal -Continue Cefepime, will add Vancomycin -Will give one time dose IV Solu-Medrol 125 mg -Bronchodilators -High risk for intubation  A-fib w/ RVR Mildly elevated troponin, likely demand ischemia Hx: Systolic CHF (EF 38-25% in 02/2018) -Cardiac monitoring -Maintain MAP >65 -Amiodarone & Cardizem drips -Cardiology consulted, appreciate input -Trend Troponin -Trend BNP -Check TSH  Sepsis secondary to CAP and possible COVID-19 -Monitor fever curve -Trend WBC's and Procalcitonin -Follow cultures as above -Continue Cefepime, will restart vancomycin for now -COVID-19 pending -Follow Ferritin, Fibrinogen, FDP's, and CRP>>> ferritin, fibrinogen, FDPs significantly elevated concerning for cytokine storm, will start hydroxychloroquine and colchicine  CKD III -Monitor I&O's / urinary output -Follow BMP -Ensure adequate renal perfusion -Avoid nephrotoxic agents as able -Replace electrolytes as indicated  Hx of Hypothyroidism -continue home  Synthroid -Check TSH       DISPOSITION: STEPDOWN GOALS OF CARE: FULL CODE VTE PROPHYLAXIS: Lovenox UPDATES: UPDATED PT AT BEDSIDE AND UPDATED PT'S HUSBAND VIA TELEPHONE 4/9.  THEY BOTH CONFIRM SHE IS A FULL CODE AND WOULD WANT INTUBATION IF NEEDED.   Darel Hong, AGACNP-BC [facility] Pager: [phone] Cell: [phone]  10/29/2018, 10:00 PM

## 2018-10-29 NOTE — Progress Notes (Signed)
Central Tele called this RN and notified of patients heart rate in the 180's. Upon arrival the patient had just been up to the bathroom with the NT. Patient was back in bed upon arrival and noted to have difficulty breathing. RRT called. Oxygen increased to 3 liters with no change then placed on NRB. MD notified new orders placed and patient transferred to ICU with COVID precautions. Husband notified of change and transfer.

## 2018-10-29 NOTE — Plan of Care (Signed)
  Problem: Health Behavior/Discharge Planning: Goal: Ability to manage health-related needs will improve Outcome: Progressing Note:  Still waiting for the result of the COVID-19 test at this time. Will continue to monitor for test result(s). Wenda Low [facility]

## 2018-10-29 NOTE — TOC Initial Note (Signed)
Transition of Care [facility]) - Initial/Assessment Note    Patient Details  Name: Anna Mcbride MRN: [id number] Date of Birth: [birth year]  Transition of Care [facility]) CM/SW Contact:    Elza Rafter, RN Phone Number: 10/29/2018, 12:46 PM  Clinical Narrative:    Patient is independent from home with husband.  Admitted with acute respiratory failure.  Currently on 3L acute oxygen; pending COVID-19 results.  Current with PCP.  Obtains medications at [facility] without difficulty.  No transportation issues.  Uses a walker sometimes.  Patient is still very SOB.    Offered home health services.  Patient does not have a preference of agency.  Referral to [facility] as they are in network for RN and PT.  They accepted and they will be notified when patient discharges.  No further needs identified at this time.  Will continue to assist with discharge planning and follow as patient progresses.             Expected Discharge Plan: [place] Barriers to Discharge: Continued Medical Work up   Patient Goals and CMS Choice Patient states their goals for this hospitalization and ongoing recovery are:: discharge with home health services  CMS Medicare.gov Compare Post Acute Care list provided to:: Patient Choice offered to / list presented to : Patient  Expected Discharge Plan and Services Expected Discharge Plan: [place]   Discharge Planning Services: CM Consult Post Acute Care Choice: [place] arrangements for the past 2 months: Single Family Home                     HH Arranged: RN, PT [place] Agency: [facility]  Prior Living Arrangements/Services Living arrangements for the past 2 months: Single Family Home Lives with:: Spouse Patient language and need for interpreter reviewed:: Yes Do you feel safe going back to the place where you live?: Yes            Criminal Activity/Legal Involvement Pertinent to Current  Situation/Hospitalization: No - Comment as needed  Activities of Daily Living Home Assistive Devices/Equipment: Eyeglasses, Oxygen, Blood pressure cuff, Wheelchair, Environmental consultant (specify type) ADL Screening (condition at time of admission) Patient's cognitive ability adequate to safely complete daily activities?: Yes Is the patient deaf or have difficulty hearing?: No Does the patient have difficulty seeing, even when wearing glasses/contacts?: No Does the patient have difficulty concentrating, remembering, or making decisions?: No Patient able to express need for assistance with ADLs?: Yes Does the patient have difficulty dressing or bathing?: No Independently performs ADLs?: Yes (appropriate for developmental age) Does the patient have difficulty walking or climbing stairs?: No Weakness of Legs: None Weakness of Arms/Hands: Both  Permission Sought/Granted Permission sought to share information with : [facility] [place] administrator granted to share info w AGENCY: [facility]        Emotional Assessment   Attitude/Demeanor/Rapport: Gracious Affect (typically observed): Accepting Orientation: : Oriented to Self, Oriented to Place, Oriented to  Time, Oriented to Situation Alcohol / Substance Use: Not Applicable    Admission diagnosis:  Sepsis, due to unspecified organism, unspecified whether acute organ dysfunction present [facility]) [A41.9] Patient Active Problem List   Diagnosis Date Noted  . Suspected Covid-19 Virus Infection 10/27/2018  . Malnutrition of moderate degree 01/08/2018  . Adjustment disorder with anxiety 01/06/2018  . Epigastric abdominal pain   . History of esophagitis 12/29/2017  . GERD (gastroesophageal reflux disease)  12/19/2017  . Biliary sludge 12/19/2017  . CKD (chronic kidney disease), stage III ([place]) 12/19/2017  . Chronic respiratory failure with hypoxia, on home O2 therapy ([place]) 12/16/2017  . Back pain with sciatica 05/07/2017  . Severe sepsis  ([place]) 09/12/2016  . Pneumothorax   . Diffuse large B-cell lymphoma of lymph nodes of neck ([place]) 02/21/2016  . Recurrent upper respiratory infection (URI) 09/01/2015  . Acute respiratory failure with hypoxia ([place])   . 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 SOB (shortness of breath)   . CAP (community acquired pneumonia)   . Acute respiratory failure ([place]) 11/29/2014  . History of digestive disease 11/29/2014  . HLD (hyperlipidemia) 11/29/2014  . Adult hypothyroidism 11/29/2014  . Lymphoma ([place]) 11/29/2014  . RAD (reactive airway disease) 11/29/2014  . 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 Pain in shoulder 11/01/2014  . Fracture of humerus, proximal 11/01/2014  . Febrile neutropenia ([place]) 11/08/2013  . Decreased potassium in the blood 09/02/2013  . Lymphoma, non-Hodgkin's ([place]) 06/30/2013  . Cervical lymphadenopathy 04/20/2013  . History of lymphoma 04/20/2013  . Dyspnea 01/29/2011  . Cough 01/29/2011   PCP:  Baxter Hire, MD Pharmacy:   [facility], [place] [address] Phone: [phone] Fax: [phone]     Social Determinants of Health (SDOH) Interventions    Readmission Risk Interventions Readmission Risk Prevention Plan 10/29/2018  Transportation Screening Complete  PCP or Specialist Appt within 3-5 Days Complete  HRI or [place] Complete  Social Work Consult for [place] Planning/Counseling Patient refused  Palliative Care Screening Not Applicable  Medication Review Press photographer) Complete  Some recent data might be hidden

## 2018-10-29 NOTE — Consult Note (Signed)
  Amiodarone Drug - Drug Interaction Consult Note  Recommendations: Pharmacy has reviewed patient's current medication list.  No recommendations or changes needed at this time.   Amiodarone is metabolized by the cytochrome P450 system and therefore has the potential to cause many drug interactions. Amiodarone has an average plasma half-life of 50 days (range 20 to 100 days).   There is potential for drug interactions to occur several weeks or months after stopping treatment and the onset of drug interactions may be slow after initiating amiodarone.   []  Statins: Increased risk of myopathy. Simvastatin- restrict dose to 20mg  daily. Other statins: counsel patients to report any muscle pain or weakness immediately.  []  Anticoagulants: Amiodarone can increase anticoagulant effect. Consider warfarin dose reduction. Patients should be monitored closely and the dose of anticoagulant altered accordingly, remembering that amiodarone levels take several weeks to stabilize.  []  Antiepileptics: Amiodarone can increase plasma concentration of phenytoin, the dose should be reduced. Note that small changes in phenytoin dose can result in large changes in levels. Monitor patient and counsel on signs of toxicity.  []  Beta blockers: increased risk of bradycardia, AV block and myocardial depression. Sotalol - avoid concomitant use.  []   Calcium channel blockers (diltiazem and verapamil): increased risk of bradycardia, AV block and myocardial depression.  []   Cyclosporine: Amiodarone increases levels of cyclosporine. Reduced dose of cyclosporine is recommended.  []  Digoxin dose should be halved when amiodarone is started.  []  Diuretics: increased risk of cardiotoxicity if hypokalemia occurs.  []  Oral hypoglycemic agents (glyburide, glipizide, glimepiride): increased risk of hypoglycemia. Patient's glucose levels should be monitored closely when initiating amiodarone therapy.   []  Drugs that prolong the QT  interval:  Torsades de pointes risk may be increased with concurrent use - avoid if possible.  Monitor QTc, also keep magnesium/potassium WNL if concurrent therapy can't be avoided. [name] Antibiotics: e.g. fluoroquinolones, erythromycin. . Antiarrhythmics: e.g. quinidine, procainamide, disopyramide, sotalol. . Antipsychotics: e.g. phenothiazines, haloperidol.  . Lithium, tricyclic antidepressants, and methadone.  Thank You,  Pernell Dupre, PharmD, BCPS Clinical Pharmacist 10/29/2018 8:07 PM

## 2018-10-29 NOTE — Progress Notes (Signed)
Called by nurse due to patient heart rate being in the 190s increasing oxygen requirement I have ordered a stat ABG patient on amiodarone drip She will be transferred to the ICU

## 2018-10-29 NOTE — Progress Notes (Signed)
At approximately 0555, patient's heart rate on the monitor suddenly increased; going from NSR to an irregular rhythm, fluctuating from low 100s to 190s.  On-call MD notified.  Orders given for K and Mag levels and to supplement if needed.  If K is < 4, give 40 mEq; if Mag is <2, give 2 g.  MD also gave orders for EKG and 10 mg cardizem IV push.

## 2018-10-30 ENCOUNTER — Inpatient Hospital Stay: Payer: Medicare Other

## 2018-10-30 LAB — CBC WITH DIFFERENTIAL/PLATELET
Abs Immature Granulocytes: 0.02 10*3/uL (ref 0.00–0.07)
Basophils Absolute: 0 10*3/uL (ref 0.0–0.1)
Basophils Relative: 0 %
Eosinophils Absolute: 0 10*3/uL (ref 0.0–0.5)
Eosinophils Relative: 0 %
HCT: 36 % (ref 36.0–46.0)
Hemoglobin: 11.5 g/dL — ABNORMAL LOW (ref 12.0–15.0)
Immature Granulocytes: 0 %
Lymphocytes Relative: 8 %
Lymphs Abs: 0.4 10*3/uL — ABNORMAL LOW (ref 0.7–4.0)
MCH: 32.4 pg (ref 26.0–34.0)
MCHC: 31.9 g/dL (ref 30.0–36.0)
MCV: 101.4 fL — ABNORMAL HIGH (ref 80.0–100.0)
Monocytes Absolute: 0.3 10*3/uL (ref 0.1–1.0)
Monocytes Relative: 6 %
Neutro Abs: 4.6 10*3/uL (ref 1.7–7.7)
Neutrophils Relative %: 86 %
Platelets: 142 10*3/uL — ABNORMAL LOW (ref 150–400)
RBC: 3.55 MIL/uL — ABNORMAL LOW (ref 3.87–5.11)
RDW: 13.8 % (ref 11.5–15.5)
WBC: 5.4 10*3/uL (ref 4.0–10.5)
nRBC: 0 % (ref 0.0–0.2)

## 2018-10-30 LAB — BASIC METABOLIC PANEL
Anion gap: 10 (ref 5–15)
BUN: 27 mg/dL — ABNORMAL HIGH (ref 8–23)
CO2: 21 mmol/L — ABNORMAL LOW (ref 22–32)
Calcium: 8.6 mg/dL — ABNORMAL LOW (ref 8.9–10.3)
Chloride: 108 mmol/L (ref 98–111)
Creatinine, Ser: 1.11 mg/dL — ABNORMAL HIGH (ref 0.44–1.00)
GFR calc Af Amer: 57 mL/min — ABNORMAL LOW (ref >60)
GFR calc non Af Amer: 49 mL/min — ABNORMAL LOW (ref >60)
Glucose, Bld: 163 mg/dL — ABNORMAL HIGH (ref 70–99)
Potassium: 4 mmol/L (ref 3.5–5.1)
Sodium: 139 mmol/L (ref 135–145)

## 2018-10-30 LAB — BRAIN NATRIURETIC PEPTIDE: B Natriuretic Peptide: 783 pg/mL — ABNORMAL HIGH (ref 0.0–100.0)

## 2018-10-30 LAB — LACTATE DEHYDROGENASE: LDH: 143 U/L (ref 98–192)

## 2018-10-30 LAB — COMPREHENSIVE METABOLIC PANEL
ALT: 15 U/L (ref 0–44)
AST: 16 U/L (ref 15–41)
Albumin: 3 g/dL — ABNORMAL LOW (ref 3.5–5.0)
Alkaline Phosphatase: 58 U/L (ref 38–126)
Anion gap: 10 (ref 5–15)
BUN: 24 mg/dL — ABNORMAL HIGH (ref 8–23)
CO2: 20 mmol/L — ABNORMAL LOW (ref 22–32)
Calcium: 8.6 mg/dL — ABNORMAL LOW (ref 8.9–10.3)
Chloride: 108 mmol/L (ref 98–111)
Creatinine, Ser: 1.02 mg/dL — ABNORMAL HIGH (ref 0.44–1.00)
GFR calc Af Amer: >60 mL/min (ref >60)
GFR calc non Af Amer: 54 mL/min — ABNORMAL LOW (ref >60)
Glucose, Bld: 116 mg/dL — ABNORMAL HIGH (ref 70–99)
Potassium: 4 mmol/L (ref 3.5–5.1)
Sodium: 138 mmol/L (ref 135–145)
Total Bilirubin: 0.7 mg/dL (ref 0.3–1.2)
Total Protein: 6.8 g/dL (ref 6.5–8.1)

## 2018-10-30 LAB — NOVEL CORONAVIRUS, NAA (HOSP ORDER, SEND-OUT TO REF LAB; TAT 18-24 HRS): SARS-CoV-2, NAA: NOT DETECTED

## 2018-10-30 LAB — PROCALCITONIN: Procalcitonin: 3.19 ng/mL

## 2018-10-30 LAB — FERRITIN
Ferritin: 1394 ng/mL — ABNORMAL HIGH (ref 11–307)
Ferritin: 1326 ng/mL — ABNORMAL HIGH (ref 11–307)

## 2018-10-30 LAB — TROPONIN I
Troponin I: 0.07 ng/mL (ref <0.03)
Troponin I: 0.06 ng/mL (ref <0.03)

## 2018-10-30 LAB — FIBRIN DERIVATIVES D-DIMER (ARMC ONLY): Fibrin derivatives D-dimer (ARMC): 3855.13 ng/mL (FEU) — ABNORMAL HIGH (ref 0.00–499.00)

## 2018-10-30 LAB — FIBRINOGEN: Fibrinogen: >750 mg/dL — ABNORMAL HIGH (ref 210–475)

## 2018-10-30 LAB — C-REACTIVE PROTEIN: CRP: 24.3 mg/dL — ABNORMAL HIGH (ref <1.0)

## 2018-10-30 MED ORDER — AMIODARONE HCL 200 MG PO TABS
400.0000 mg | ORAL_TABLET | Freq: Two times a day (BID) | ORAL | Status: DC
  Administered 2018-10-30 – 2018-10-31 (×2): 400 mg via ORAL
  Filled 2018-10-30 (×2): qty 2

## 2018-10-30 MED ORDER — ALPRAZOLAM 0.5 MG PO TABS
0.5000 mg | ORAL_TABLET | Freq: Once | ORAL | Status: AC
  Administered 2018-10-30: 01:00:00 0.5 mg via ORAL

## 2018-10-30 MED ORDER — HYDROXYCHLOROQUINE SULFATE 200 MG PO TABS
400.0000 mg | ORAL_TABLET | Freq: Two times a day (BID) | ORAL | Status: AC
  Administered 2018-10-30 (×2): 400 mg via ORAL
  Filled 2018-10-30 (×2): qty 2

## 2018-10-30 MED ORDER — VANCOMYCIN HCL 1000 MG IV SOLR: 1000.0000 mg | INTRAVENOUS | Status: DC

## 2018-10-30 MED ORDER — SODIUM CHLORIDE 0.9 % IV SOLN
INTRAVENOUS | Status: DC | PRN
  Administered 2018-10-30: 21:00:00 250 mL via INTRAVENOUS

## 2018-10-30 MED ORDER — FUROSEMIDE 10 MG/ML IJ SOLN
20.0000 mg | Freq: Once | INTRAMUSCULAR | Status: AC
  Administered 2018-10-30: 03:00:00 20 mg via INTRAVENOUS
  Filled 2018-10-30: qty 2

## 2018-10-30 MED ORDER — COLCHICINE 0.6 MG PO TABS
0.6000 mg | ORAL_TABLET | Freq: Two times a day (BID) | ORAL | Status: DC
  Administered 2018-10-30 (×2): 0.6 mg via ORAL
  Filled 2018-10-30 (×3): qty 1

## 2018-10-30 MED ORDER — POTASSIUM CHLORIDE CRYS ER 20 MEQ PO TBCR
20.0000 meq | EXTENDED_RELEASE_TABLET | Freq: Every day | ORAL | Status: DC
  Administered 2018-10-31 (×2): 20 meq via ORAL
  Filled 2018-10-30 (×2): qty 1

## 2018-10-30 MED ORDER — HYDROXYCHLOROQUINE SULFATE 200 MG PO TABS: 200.0000 mg | ORAL_TABLET | Freq: Two times a day (BID) | ORAL | Status: DC

## 2018-10-30 MED ORDER — POTASSIUM CHLORIDE CRYS ER 20 MEQ PO TBCR: 20.0000 meq | EXTENDED_RELEASE_TABLET | Freq: Two times a day (BID) | ORAL | Status: DC

## 2018-10-30 MED ORDER — VANCOMYCIN HCL 10 G IV SOLR
1500.0000 mg | Freq: Once | INTRAVENOUS | Status: AC
  Administered 2018-10-30: 02:00:00 1500 mg via INTRAVENOUS
  Filled 2018-10-30: qty 1500

## 2018-10-30 NOTE — Progress Notes (Signed)
Pharmacy Antibiotic Note  Anna Mcbride is a 73 y.o. female admitted on 10/27/2018 with pneumonia.  Patient admitted through ED with cough and fever. Patient with past medical history significant for hypothyroidism, anemia, asthma, and non-Hodgkin's lmphoma. Pharmacy has been consulted for cefepime dosing.  Plan: Continue cefepime 2g IV Q24hr.   Vancomycin discontinued secondary to negative MRSA PCR.   Height: 5\' 1"  (154.9 cm) Weight: 122 lb 2.2 oz (55.4 kg) IBW/kg (Calculated) : 47.8  Temp (24hrs), Avg:98.9 F (37.2 C), Min:98.1 F (36.7 C), Max:99.9 F (37.7 C)  Recent Labs  Lab 10/27/18 1658 10/28/18 0159 10/29/18 2242 10/30/18 0649  WBC 12.2* 10.8* 9.0 5.4  CREATININE 1.21* 1.05* 1.02* 1.11*  LATICACIDVEN 1.7  --   --   --     Estimated Creatinine Clearance: 33.6 mL/min (A) (by C-G formula based on SCr of 1.11 mg/dL (H)).    Allergies  Allergen Reactions  . Hydrocodone-Homatropine Itching  . Tussionex Pennkinetic Er [Hydrocod Polst-Cpm Polst Er] Itching  . Ciprofloxacin Rash    Antimicrobials this admission: Metronidazole 4/7 x 1 Cefepime 4/7 >> Vancomycin 4/10 x 1.  Hydroxychloroquine 4/10 x 2   Dose adjustments this admission: N/A  Microbiology results: 4/7 BCx: no growth x 3 days  4/7 UCx: no growth  4/8 MRSA PCR: negative   Thank you for allowing pharmacy to be a part of this patient's care.  Simpson,Michael L 10/30/2018 2:45 PM

## 2018-10-30 NOTE — Progress Notes (Signed)
Pharmacy Antibiotic Note  Anna Mcbride is a 73 y.o. female admitted on 10/27/2018 with pneumonia.  Pharmacy has been consulted for vancomuycin dosing. Patient previously received vanc 1g on admission on 04/7, now is being restarted for HAP pneumonia w/ high suspicion for COVID pneumonia.  Plan: Patient received vanc 1.5g IV load  Vancomycin 1000 mg IV Q 36 hrs. Goal AUC 400-550. Expected AUC: 486.3 SCr used: 1.02 Cssmin: 10.5  Height: 5\' 1"  (154.9 cm) Weight: 121 lb 0.5 oz (54.9 kg) IBW/kg (Calculated) : 47.8  Temp (24hrs), Avg:99.1 F (37.3 C), Min:98.4 F (36.9 C), Max:99.9 F (37.7 C)  Recent Labs  Lab 10/27/18 1658 10/28/18 0159 10/29/18 2242  WBC 12.2* 10.8* 9.0  CREATININE 1.21* 1.05* 1.02*  LATICACIDVEN 1.7  --   --     Estimated Creatinine Clearance: 36.5 mL/min (A) (by C-G formula based on SCr of 1.02 mg/dL (H)).    Allergies  Allergen Reactions  . Hydrocodone-Homatropine Itching  . Tussionex Pennkinetic Er [Hydrocod Polst-Cpm Polst Er] Itching  . Ciprofloxacin Rash   Thank you for allowing pharmacy to be a part of this patient's care.  Tobie Lords, PharmD, BCPS Clinical Pharmacist 10/30/2018

## 2018-10-30 NOTE — Progress Notes (Signed)
She converted back to normal sinus rhythm shortly after amiodarone was initiated.  I have changed amiodarone infusion to p.o. loading dose of 400 mg twice a day.  She has been seen previously by [facility] cardiology.  Unfortunately, I was unable to contact today to request consultation.  Either they did not respond to the page or I missed their call back.  She is back on [place] O2 and appears very comfortable.  COVID-19 has been ruled out.  I will transfer out to [place] floor with cardiac monitoring.  She is followed by Dr. Mortimer Fries as an outpatient so she will remain on our rounding list and 1 of [place] will see her again on Monday 4/13.  Please call sooner if needed.  Merton Border, MD PCCM service Mobile [phone] Pager [phone] 10/30/2018 5:09 PM

## 2018-10-31 LAB — COMPREHENSIVE METABOLIC PANEL
ALT: 16 U/L (ref 0–44)
AST: 17 U/L (ref 15–41)
Albumin: 2.7 g/dL — ABNORMAL LOW (ref 3.5–5.0)
Alkaline Phosphatase: 54 U/L (ref 38–126)
Anion gap: 11 (ref 5–15)
BUN: 50 mg/dL — ABNORMAL HIGH (ref 8–23)
CO2: 20 mmol/L — ABNORMAL LOW (ref 22–32)
Calcium: 8.9 mg/dL (ref 8.9–10.3)
Chloride: 109 mmol/L (ref 98–111)
Creatinine, Ser: 1.27 mg/dL — ABNORMAL HIGH (ref 0.44–1.00)
GFR calc Af Amer: 48 mL/min — ABNORMAL LOW (ref >60)
GFR calc non Af Amer: 42 mL/min — ABNORMAL LOW (ref >60)
Glucose, Bld: 138 mg/dL — ABNORMAL HIGH (ref 70–99)
Potassium: 3.5 mmol/L (ref 3.5–5.1)
Sodium: 140 mmol/L (ref 135–145)
Total Bilirubin: 0.4 mg/dL (ref 0.3–1.2)
Total Protein: 6.5 g/dL (ref 6.5–8.1)

## 2018-10-31 LAB — CLOSTRIDIUM DIFFICILE BY PCR, REFLEXED: Toxigenic C. Difficile by PCR: NEGATIVE

## 2018-10-31 LAB — C DIFFICILE QUICK SCREEN W PCR REFLEX
C Diff antigen: POSITIVE — AB
C Diff toxin: NEGATIVE

## 2018-10-31 LAB — PROCALCITONIN: Procalcitonin: 2.39 ng/mL

## 2018-10-31 LAB — CBC
HCT: 33.7 % — ABNORMAL LOW (ref 36.0–46.0)
Hemoglobin: 11.1 g/dL — ABNORMAL LOW (ref 12.0–15.0)
MCH: 33 pg (ref 26.0–34.0)
MCHC: 32.9 g/dL (ref 30.0–36.0)
MCV: 100.3 fL — ABNORMAL HIGH (ref 80.0–100.0)
Platelets: 168 10*3/uL (ref 150–400)
RBC: 3.36 MIL/uL — ABNORMAL LOW (ref 3.87–5.11)
RDW: 13.7 % (ref 11.5–15.5)
WBC: 13.2 10*3/uL — ABNORMAL HIGH (ref 4.0–10.5)
nRBC: 0 % (ref 0.0–0.2)

## 2018-10-31 LAB — LACTOFERRIN, FECAL, QUALITATIVE: Lactoferrin, Fecal, Qual: POSITIVE — AB

## 2018-10-31 MED ORDER — AMIODARONE HCL 400 MG PO TABS: 400.0000 mg | ORAL_TABLET | Freq: Every day | ORAL | 0 refills | Status: AC

## 2018-10-31 MED ORDER — LACTINEX PO CHEW: 1.0000 | CHEWABLE_TABLET | Freq: Three times a day (TID) | ORAL | 0 refills | Status: AC

## 2018-10-31 MED ORDER — CEFDINIR 300 MG PO CAPS: 300.0000 mg | ORAL_CAPSULE | Freq: Two times a day (BID) | ORAL | 0 refills | Status: DC

## 2018-10-31 NOTE — Discharge Summary (Signed)
[place] [facility] [place] NAME: Anna Mcbride    MR#:  [id number]  DATE OF BIRTH:  19-Jun-1946  DATE OF ADMISSION:  10/27/2018 ADMITTING PHYSICIAN: Lance Coon, MD  DATE OF DISCHARGE: No discharge date for patient encounter.  PRIMARY CARE PHYSICIAN: Baxter Hire, MD    ADMISSION DIAGNOSIS:  Sepsis, due to unspecified organism, unspecified whether acute organ dysfunction present ([place]) [A41.9]  DISCHARGE DIAGNOSIS:  Principal Problem:   Severe sepsis ([place]) Active Problems:   HLD (hyperlipidemia)   Adult hypothyroidism   CAP (community acquired pneumonia)   GERD (gastroesophageal reflux disease)   CKD (chronic kidney disease), stage III ([place])   Suspected Covid-19 Virus Infection   SECONDARY DIAGNOSIS:   Past Medical History:  Diagnosis Date  . Acquired hypothyroidism 07/01/2013  . Acute respiratory failure ([place]) 11/29/2014  . 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 Asthma   . Back pain with sciatica 05/07/2017  . Biliary sludge 12/19/2017  . Lymphoma, non-Hodgkin's ([place]) 06/30/2013  . Non Hodgkin's lymphoma ([place]) [id number]  . Personal history of chemotherapy   . Recurrent upper respiratory infection (URI) 09/01/2015  . 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  *Acute dehydration  Resolved with IV fluids for rehydration   *Hypokalemia, hyponatremia Repleted   DISCHARGE CONDITIONS:   stable  CONSULTS OBTAINED:  Treatment Team:  Corey Skains, MD  DRUG ALLERGIES:   Allergies  Allergen Reactions  . Hydrocodone-Homatropine Itching  . Tussionex Pennkinetic Er [Hydrocod Polst-Cpm Polst Er] Itching  . Ciprofloxacin Rash    DISCHARGE MEDICATIONS:   Allergies as of 10/31/2018      Reactions   Hydrocodone-homatropine Itching   Tussionex Pennkinetic Er [hydrocod Polst-cpm Polst Er] Itching   Ciprofloxacin Rash      Medication List    TAKE these medications   acetaminophen 325 MG tablet Commonly known as:  TYLENOL Take 2 tablets (650 mg total) by mouth every 6 (six) hours as needed for mild pain (headache).   albuterol 108 (90 Base) MCG/ACT inhaler Commonly known as:  PROVENTIL HFA;VENTOLIN HFA Inhale 1 puff into the lungs every 6 (six) hours as needed for wheezing or shortness of breath.   ALPRAZolam 0.5 MG tablet Commonly known as:  XANAX Take 1 tablet (0.5 mg total) by mouth at bedtime.   amiodarone 400 MG tablet Commonly known as:  PACERONE Take 1 tablet (400 mg total) by mouth daily.   cefdinir 300 MG capsule Commonly known as:  OMNICEF Take 1 capsule (300 mg total)  by mouth 2 (two) times daily.   CENTRUM ADULTS PO Take 1 tablet by mouth daily.   citalopram 20 MG tablet Commonly known as:  CELEXA TAKE 1 TABLET BY MOUTH ONCE DAILY   feeding supplement (ENSURE ENLIVE) Liqd Take 237 mLs by mouth 3 (three) times daily between meals.   Flutter Devi Use 10-15 times daily    furosemide 20 MG tablet Commonly known as:  Lasix Take 1 tablet (20 mg total) by mouth daily.   guaiFENesin 600 MG 12 hr tablet Commonly known as:  MUCINEX Take 600 mg by mouth daily.   ipratropium-albuterol 0.5-2.5 (3) MG/3ML Soln Commonly known as:  DUONEB Take 3 mLs by nebulization every 6 (six) hours.   lactobacillus acidophilus & bulgar chewable tablet Chew 1 tablet by mouth 3 (three) times daily with meals.   levothyroxine 25 MCG tablet Commonly known as:  SYNTHROID, LEVOTHROID TAKE 1 TABLET BY MOUTH ONCE DAILY ON AN EMPTY STOMACH. WAIT 30 MINUTES BEFORE TAKING OTHER MEDS.   multivitamin with minerals Tabs tablet Take 1 tablet by mouth daily.   omeprazole 20 MG capsule Commonly known as:  PRILOSEC TAKE 1 CAPSULE BY MOUTH TWICE DAILY BEFORE MEALS   potassium chloride SA 20 MEQ tablet Commonly known as:  K-DUR,KLOR-CON Take 1 tablet (20 mEq total) by mouth daily.   predniSONE 10 MG tablet Commonly known as:  DELTASONE Take 1 tablet by mouth daily.   vitamin C 500 MG tablet Commonly known as:  ASCORBIC ACID Take 1,000 mg by mouth daily.        DISCHARGE INSTRUCTIONS:  If you experience worsening of your admission symptoms, develop shortness of breath, life threatening emergency, suicidal or homicidal thoughts you must seek medical attention immediately by calling 911 or calling your MD immediately  if symptoms less severe.  You Must read complete instructions/literature along with all the possible adverse reactions/side effects for all the Medicines you take and that have been prescribed to you. Take any new Medicines after you have completely understood and accept all the possible adverse reactions/side effects.   Please note  You were cared for by a hospitalist during your hospital stay. If you have any questions about your discharge medications or the care you received while you were in the hospital after you are discharged, you can call the unit and asked to  speak with the hospitalist on call if the hospitalist that took care of you is not available. Once you are discharged, your primary care physician will handle any further medical issues. Please note that NO REFILLS for any discharge medications will be authorized once you are discharged, as it is imperative that you return to your primary care physician (or establish a relationship with a primary care physician if you do not have one) for your aftercare needs so that they can reassess your need for medications and monitor your lab values.    Today   CHIEF COMPLAINT:   Chief Complaint  Patient presents with  . Weakness    HISTORY OF PRESENT ILLNESS:   73 y.o. female who presents with chief complaint as above.  Presents the ED with a complaint of fever and cough.  She states is been going on for the last several days.  Work-up here in the ED shows pleural effusions, with likely pneumonia.  She meets sepsis criteria.  Hospitalist were called for admission   VITAL SIGNS:  Blood pressure 101/71, pulse 85, temperature (!) 97.4 F (36.3 C), temperature source Oral, resp. rate  18, height 5\' 1"  (1.549 m), weight 55.1 kg, SpO2 100 %.  I/O:    Intake/Output Summary (Last 24 hours) at 10/31/2018 1058 Last data filed at 10/31/2018 0400 Gross per 24 hour  Intake 326.87 ml  Output 400 ml  Net -73.13 ml    PHYSICAL EXAMINATION:  GENERAL:  73 y.o.-year-old patient lying in the bed with no acute distress.  EYES: Pupils equal, round, reactive to light and accommodation. No scleral icterus. Extraocular muscles intact.  HEENT: Head atraumatic, normocephalic. Oropharynx and nasopharynx clear.  NECK:  Supple, no jugular venous distention. No thyroid enlargement, no tenderness.  LUNGS: Normal breath sounds bilaterally, no wheezing, rales,rhonchi or crepitation. No use of accessory muscles of respiration.  CARDIOVASCULAR: S1, S2 normal. No murmurs, rubs, or gallops.  ABDOMEN: Soft, non-tender,  non-distended. Bowel sounds present. No organomegaly or mass.  EXTREMITIES: No pedal edema, cyanosis, or clubbing.  NEUROLOGIC: Cranial nerves II through XII are intact. Muscle strength 5/5 in all extremities. Sensation intact. Gait not checked.  PSYCHIATRIC: The patient is alert and oriented x 3.  SKIN: No obvious rash, lesion, or ulcer.   DATA REVIEW:   CBC Recent Labs  Lab 10/31/18 0531  WBC 13.2*  HGB 11.1*  HCT 33.7*  PLT 168    Chemistries  Recent Labs  Lab 10/29/18 0618  10/31/18 0531  NA  --    < > 140  K 3.1*   < > 3.5  CL  --    < > 109  CO2  --    < > 20*  GLUCOSE  --    < > 138*  BUN  --    < > 50*  CREATININE  --    < > 1.27*  CALCIUM  --    < > 8.9  MG 1.9  --   --   AST  --    < > 17  ALT  --    < > 16  ALKPHOS  --    < > 54  BILITOT  --    < > 0.4   < > = values in this interval not displayed.    Cardiac Enzymes Recent Labs  Lab 10/30/18 0649  TROPONINI 0.06*    Microbiology Results  Results for orders placed or performed during the hospital encounter of 10/27/18  Blood Culture (routine x 2)     Status: None (Preliminary result)   Collection Time: 10/27/18  4:58 PM  Result Value Ref Range Status   Specimen Description BLOOD LEFT ANTECUBITAL  Final   Special Requests   Final    BOTTLES DRAWN AEROBIC AND ANAEROBIC Blood Culture adequate volume   Culture   Final    NO GROWTH 4 DAYS Performed at [facility], [address] [place], [place] [address]    Report Status PENDING  Incomplete  Blood Culture (routine x 2)     Status: None (Preliminary result)   Collection Time: 10/27/18  4:58 PM  Result Value Ref Range Status   Specimen Description BLOOD RIGHT ANTECUBITAL  Final   Special Requests   Final    BOTTLES DRAWN AEROBIC AND ANAEROBIC Blood Culture adequate volume   Culture   Final    NO GROWTH 4 DAYS Performed at [facility], [address], [place], [place] [address]    Report Status PENDING  Incomplete  Urine  culture     Status: None   Collection Time: 10/27/18  5:00 PM  Result Value Ref  Range Status   Specimen Description   Final    URINE, RANDOM Performed at [facility], [address] [place], [place] [address]    Special Requests   Final    NONE Performed at [facility], [address] [place], [place] [address]    Culture   Final    NO GROWTH Performed at [facility], [place] [address], [place], [place] [address]    Report Status 10/28/2018 FINAL  Final  Novel Coronavirus, NAA (hospital order; send-out to ref lab)     Status: None   Collection Time: 10/27/18  5:00 PM  Result Value Ref Range Status   SARS-CoV-2, NAA NOT DETECTED NOT DETECTED Final    Comment: Negative (Not Detected) results do not exclude infection caused by SARS CoV 2 and should not be used as the sole basis for treatment or other patient management decisions. Optimum specimen types and timing for peak viral levels during infections caused  by SARS CoV 2 have not been determined. Collection of multiple specimens (types and time points) from the same patient may be necessary to detect the virus. Improper specimen collection and handling, sequence variability underlying assay primers and or probes, or the presence of organisms in  quantities less than the limit of detection of the assay may lead to false negative results. Positive and negative predictive values of testing are highly dependent on prevalence. False negative results are more likely when prevalence of disease is high. (NOTE) The expected result is Negative (Not Detected). The SARS CoV 2 test is intended for the presumptive qualitative  detection of nucleic acid from SARS CoV 2 in upper and lower  respir atory specimens. Testing methodology is real time RT PCR. Test results must be correlated with clinical presentation and  evaluated in the context of other laboratory and epidemiologic data.  Test performance can be affected  because the epidemiology and  clinical spectrum of infection caused by SARS CoV 2 is not fully  known. For example, the optimum types of specimens to collect and  when during the course of infection these specimens are most likely  to contain detectable viral RNA may not be known. This test has not been Food and Drug Administration (FDA) cleared or  approved and has been authorized by FDA under an Emergency Use  Authorization (EUA). The test is only authorized for the duration of  the declaration that circumstances exist justifying the authorization  of emergency use of in vitro diagnostic tests for detection and or  diagnosis of SARS CoV 2 under Section 564(b)(1) of the Act, 21 U.S.C.  section [phone] 3(b)(1), unless the authorization is terminated or   revoked sooner. [facility] is certified under the  Clinical Laboratory Improvement Amendments of 1988 (CLIA), 42 U.S.C.  section [phone], to perform high complexity tests. Performed at [facility] [id number], [place], [place] [address] Laboratory Director: Loleta Books, MD Fact Sheet for Healthcare Providers  BankingDealers.co.za Fact Sheet for Patients  StrictlyIdeas.no Performed at [facility], [place] [address] [place], [place] [address]    Coronavirus Source NASOPHARYNGEAL  Final    Comment: Performed at [facility], [place],  [address]  MRSA PCR Screening     Status: None   Collection Time: 10/28/18  8:02 AM  Result Value Ref Range Status   MRSA by PCR NEGATIVE NEGATIVE Final    Comment:  The GeneXpert MRSA Assay (FDA approved for NASAL specimens only), is one component of a comprehensive MRSA colonization surveillance program. It is not intended to diagnose MRSA infection nor to guide or monitor treatment for MRSA infections. Performed at [facility], [address] [place], [place] [address]     RADIOLOGY:  Dg Chest Port 1 View  Result Date: 10/30/2018 CLINICAL DATA:  Acute respiratory failure. EXAM: PORTABLE CHEST 1 VIEW COMPARISON:  10/29/2018 FINDINGS: Power port inserted from the left is unchanged with the tip in the SVC above the right atrium. Bilateral pulmonary and pleural scarring persists, with the possibility of some coexistent acute patchy infiltrates. Pattern appears quite similar to multiple previous films. No worsening or new finding. IMPRESSION: No worsening or new finding. Extensive bilateral pleural and parenchymal scarring. Possible associated acute pulmonary infiltrate, particularly suspicious in the left upper lobe. Electronically Signed   By: Nelson Chimes M.D.   On: 10/30/2018 05:33   Dg Chest Port 1 View  Result Date: 10/29/2018 CLINICAL DATA:  Respiratory distress EXAM: PORTABLE CHEST 1 VIEW COMPARISON:  10/27/2018 FINDINGS: Left chest wall Port-A-Cath tip projects over the lower SVC, unchanged. Unchanged size of right pleural effusion. Increased left basilar atelectasis. Upper right chest opacities are unchanged. IMPRESSION: Slightly increased left basilar atelectasis but otherwise unchanged appearance of chronic lung disease. Electronically Signed   By: Ulyses Jarred M.D.   On: 10/29/2018 20:57    EKG:   Orders placed or performed during the hospital encounter of 10/27/18  . ED EKG 12-Lead  . ED EKG 12-Lead  . EKG 12-Lead  . EKG 12-Lead  . EKG 12-Lead  . EKG 12-Lead  . EKG 12-Lead  . EKG 12-Lead      Management plans discussed with the patient, family and they are in agreement.  CODE STATUS:     Code Status Orders  (From admission, onward)         Start     Ordered   10/28/18 0137  Full code  Continuous     10/28/18 0136        Code Status History    Date Active Date Inactive Code Status Order ID Comments User Context   03/01/2018 2245 03/03/2018 1718 Full Code [id number]  Sela Hua, MD Inpatient   01/02/2018 1206 01/08/2018 2006 Full Code [id number]  [name], MD Inpatient   12/20/2017 0128 12/21/2017 1718 Full Code [id number]  Lance Coon, MD Inpatient   05/29/2017 1740 05/30/2017 1649 Full Code [id number]  Henreitta Leber, MD Inpatient   11/26/2016 1844 11/29/2016 1513 Full Code [id number]  Henreitta Leber, MD Inpatient   09/12/2016 0223 09/13/2016 1756 DNR [id number]  Harrie Foreman, MD ED   06/18/2016 1821 06/20/2016 1535 DNR [id number]  Vaughan Basta, MD Inpatient   11/29/2014 1405 12/05/2014 1550 DNR [id number]  Evlyn Kanner, NP Inpatient    Advance Directive Documentation     Most Recent Value  Type of Advance Directive  Healthcare Power of Attorney, Living will  Pre-existing out of facility DNR order (yellow form or pink MOST form)  -  "MOST" Form in Place?  -      TOTAL TIME TAKING CARE OF THIS PATIENT: 40 minutes.    Avel Peace Salary M.D on 10/31/2018 at 10:58 AM  Between 7am to 6pm - Pager - [phone]  After 6pm go to [email or web address] - password [email or web address] [facility]  Office  [phone]  CC: Primary care physician; Harrel Lemon  D, MD   Note: This dictation was prepared with Dragon dictation along with smaller phrase technology. Any transcriptional errors that result from this process are unintentional.

## 2018-10-31 NOTE — TOC Transition Note (Signed)
Transition of Care [facility]) - CM/SW Discharge Note   Patient Details  Name: Anna Mcbride MRN: [id number] Date of Birth: Aug 02, 1945  [facility] CM/SW Contact:  Latanya Maudlin, RN Phone Number: 10/31/2018, 11:27 AM   Clinical Narrative:  Patient to be discharged per MD order. Orders in place for home health services. Previous RNCM had setup discharge with [facility] home [facility]. Notified Cassie of pending discharge. Patient has no DME needs. Family to transport.       Final next level of care: [place] Barriers to Discharge: No Barriers Identified   Patient Goals and CMS Choice Patient states their goals for this hospitalization and ongoing recovery are:: discharge with home health services  CMS Medicare.gov Compare Post Acute Care list provided to:: Patient Choice offered to / list presented to : Patient  Discharge Placement                       Discharge Plan and Services   Discharge Planning Services: CM Consult Post Acute Care Choice: Home Health              HH Arranged: RN, PT [facility] Agency: [facility]   Social Determinants of Health (SDOH) Interventions     Readmission Risk Interventions Readmission Risk Prevention Plan 10/29/2018  Transportation Screening Complete  PCP or Specialist Appt within 3-5 Days Complete  HRI or [place] Complete  Social Work Consult for [place] Planning/Counseling Patient refused  Palliative Care Screening Not Applicable  Medication Review Press photographer) Complete  Some recent data might be hidden

## 2018-11-01 LAB — CULTURE, BLOOD (ROUTINE X 2)
Culture: NO GROWTH
Culture: NO GROWTH
Special Requests: ADEQUATE
Special Requests: ADEQUATE

## 2018-11-15 ENCOUNTER — Inpatient Hospital Stay: Payer: Medicare Other

## 2018-11-15 ENCOUNTER — Other Ambulatory Visit: Payer: Self-pay

## 2018-11-15 ENCOUNTER — Inpatient Hospital Stay
Admission: EM | Admit: 2018-11-15 | Discharge: 2018-11-18 | DRG: 193 | Disposition: A | Discharge status: Home / Self Care | Payer: Medicare Other | Attending: Internal Medicine | Admitting: Internal Medicine

## 2018-11-15 ENCOUNTER — Emergency Department: Payer: Medicare Other

## 2018-11-15 DIAGNOSIS — Z66 Do not resuscitate: Secondary | ICD-10-CM | POA: Diagnosis present

## 2018-11-15 DIAGNOSIS — Z9981 Dependence on supplemental oxygen: Secondary | ICD-10-CM

## 2018-11-15 DIAGNOSIS — I447 Left bundle-branch block, unspecified: Secondary | ICD-10-CM | POA: Diagnosis present

## 2018-11-15 DIAGNOSIS — E860 Dehydration: Secondary | ICD-10-CM | POA: Diagnosis present

## 2018-11-15 DIAGNOSIS — E785 Hyperlipidemia, unspecified: Secondary | ICD-10-CM | POA: Diagnosis present

## 2018-11-15 DIAGNOSIS — Z885 Allergy status to narcotic agent status: Secondary | ICD-10-CM

## 2018-11-15 DIAGNOSIS — K5900 Constipation, unspecified: Secondary | ICD-10-CM | POA: Diagnosis present

## 2018-11-15 DIAGNOSIS — Z8249 Family history of ischemic heart disease and other diseases of the circulatory system: Secondary | ICD-10-CM

## 2018-11-15 DIAGNOSIS — E876 Hypokalemia: Secondary | ICD-10-CM | POA: Diagnosis present

## 2018-11-15 DIAGNOSIS — I480 Paroxysmal atrial fibrillation: Secondary | ICD-10-CM | POA: Diagnosis present

## 2018-11-15 DIAGNOSIS — Z923 Personal history of irradiation: Secondary | ICD-10-CM

## 2018-11-15 DIAGNOSIS — Z7952 Long term (current) use of systemic steroids: Secondary | ICD-10-CM | POA: Diagnosis not present

## 2018-11-15 DIAGNOSIS — D539 Nutritional anemia, unspecified: Secondary | ICD-10-CM | POA: Diagnosis present

## 2018-11-15 DIAGNOSIS — Z881 Allergy status to other antibiotic agents status: Secondary | ICD-10-CM

## 2018-11-15 DIAGNOSIS — Z79899 Other long term (current) drug therapy: Secondary | ICD-10-CM

## 2018-11-15 DIAGNOSIS — Z833 Family history of diabetes mellitus: Secondary | ICD-10-CM | POA: Diagnosis not present

## 2018-11-15 DIAGNOSIS — M5430 Sciatica, unspecified side: Secondary | ICD-10-CM | POA: Diagnosis present

## 2018-11-15 DIAGNOSIS — Z79890 Hormone replacement therapy (postmenopausal): Secondary | ICD-10-CM

## 2018-11-15 DIAGNOSIS — J189 Pneumonia, unspecified organism: Principal | ICD-10-CM | POA: Diagnosis present

## 2018-11-15 DIAGNOSIS — Z8701 Personal history of pneumonia (recurrent): Secondary | ICD-10-CM

## 2018-11-15 DIAGNOSIS — E039 Hypothyroidism, unspecified: Secondary | ICD-10-CM | POA: Diagnosis present

## 2018-11-15 DIAGNOSIS — Z8572 Personal history of non-Hodgkin lymphomas: Secondary | ICD-10-CM | POA: Diagnosis not present

## 2018-11-15 DIAGNOSIS — Z9221 Personal history of antineoplastic chemotherapy: Secondary | ICD-10-CM

## 2018-11-15 DIAGNOSIS — J90 Pleural effusion, not elsewhere classified: Secondary | ICD-10-CM | POA: Diagnosis present

## 2018-11-15 DIAGNOSIS — R918 Other nonspecific abnormal finding of lung field: Secondary | ICD-10-CM

## 2018-11-15 DIAGNOSIS — Z9484 Stem cells transplant status: Secondary | ICD-10-CM

## 2018-11-15 DIAGNOSIS — M545 Low back pain: Secondary | ICD-10-CM | POA: Diagnosis present

## 2018-11-15 DIAGNOSIS — J8410 Pulmonary fibrosis, unspecified: Secondary | ICD-10-CM | POA: Diagnosis present

## 2018-11-15 DIAGNOSIS — T501X5A Adverse effect of loop [high-ceiling] diuretics, initial encounter: Secondary | ICD-10-CM | POA: Diagnosis present

## 2018-11-15 DIAGNOSIS — J45909 Unspecified asthma, uncomplicated: Secondary | ICD-10-CM | POA: Diagnosis present

## 2018-11-15 DIAGNOSIS — N179 Acute kidney failure, unspecified: Secondary | ICD-10-CM

## 2018-11-15 DIAGNOSIS — J9620 Acute and chronic respiratory failure, unspecified whether with hypoxia or hypercapnia: Secondary | ICD-10-CM | POA: Diagnosis present

## 2018-11-15 DIAGNOSIS — J9811 Atelectasis: Secondary | ICD-10-CM | POA: Diagnosis present

## 2018-11-15 DIAGNOSIS — R0602 Shortness of breath: Secondary | ICD-10-CM

## 2018-11-15 LAB — URINALYSIS, ROUTINE W REFLEX MICROSCOPIC
Bilirubin Urine: NEGATIVE
Glucose, UA: 50 mg/dL — AB
Hgb urine dipstick: NEGATIVE
Ketones, ur: NEGATIVE mg/dL
Leukocytes,Ua: NEGATIVE
Nitrite: NEGATIVE
Protein, ur: 30 mg/dL — AB
Specific Gravity, Urine: 1.009 (ref 1.005–1.030)
Squamous Epithelial / HPF: NONE SEEN (ref 0–5)
pH: 6 (ref 5.0–8.0)

## 2018-11-15 LAB — CBC WITH DIFFERENTIAL/PLATELET
Abs Immature Granulocytes: 0.02 10*3/uL (ref 0.00–0.07)
Basophils Absolute: 0 10*3/uL (ref 0.0–0.1)
Basophils Relative: 0 %
Eosinophils Absolute: 0.1 10*3/uL (ref 0.0–0.5)
Eosinophils Relative: 1 %
HCT: 33.5 % — ABNORMAL LOW (ref 36.0–46.0)
Hemoglobin: 10.8 g/dL — ABNORMAL LOW (ref 12.0–15.0)
Immature Granulocytes: 0 %
Lymphocytes Relative: 7 %
Lymphs Abs: 0.7 10*3/uL (ref 0.7–4.0)
MCH: 32.4 pg (ref 26.0–34.0)
MCHC: 32.2 g/dL (ref 30.0–36.0)
MCV: 100.6 fL — ABNORMAL HIGH (ref 80.0–100.0)
Monocytes Absolute: 0.9 10*3/uL (ref 0.1–1.0)
Monocytes Relative: 10 %
Neutro Abs: 7.2 10*3/uL (ref 1.7–7.7)
Neutrophils Relative %: 82 %
Platelets: 175 10*3/uL (ref 150–400)
RBC: 3.33 MIL/uL — ABNORMAL LOW (ref 3.87–5.11)
RDW: 13.3 % (ref 11.5–15.5)
WBC: 8.8 10*3/uL (ref 4.0–10.5)
nRBC: 0 % (ref 0.0–0.2)

## 2018-11-15 LAB — RESPIRATORY PANEL BY PCR

## 2018-11-15 LAB — COMPREHENSIVE METABOLIC PANEL
ALT: 13 U/L (ref 0–44)
AST: 14 U/L — ABNORMAL LOW (ref 15–41)
Albumin: 3 g/dL — ABNORMAL LOW (ref 3.5–5.0)
Alkaline Phosphatase: 60 U/L (ref 38–126)
Anion gap: 8 (ref 5–15)
BUN: 20 mg/dL (ref 8–23)
CO2: 29 mmol/L (ref 22–32)
Calcium: 8.9 mg/dL (ref 8.9–10.3)
Chloride: 100 mmol/L (ref 98–111)
Creatinine, Ser: 1.72 mg/dL — ABNORMAL HIGH (ref 0.44–1.00)
GFR calc Af Amer: 33 mL/min — ABNORMAL LOW (ref >60)
GFR calc non Af Amer: 29 mL/min — ABNORMAL LOW (ref >60)
Glucose, Bld: 104 mg/dL — ABNORMAL HIGH (ref 70–99)
Potassium: 3.3 mmol/L — ABNORMAL LOW (ref 3.5–5.1)
Sodium: 137 mmol/L (ref 135–145)
Total Bilirubin: 0.9 mg/dL (ref 0.3–1.2)
Total Protein: 6.6 g/dL (ref 6.5–8.1)

## 2018-11-15 LAB — LIPASE, BLOOD: Lipase: 23 U/L (ref 11–51)

## 2018-11-15 LAB — PROTIME-INR
INR: 1 (ref 0.8–1.2)
Prothrombin Time: 13.2 seconds (ref 11.4–15.2)

## 2018-11-15 LAB — PROCALCITONIN: Procalcitonin: <0.1 ng/mL

## 2018-11-15 LAB — LACTIC ACID, PLASMA: Lactic Acid, Venous: 1 mmol/L (ref 0.5–1.9)

## 2018-11-15 MED ORDER — IPRATROPIUM-ALBUTEROL 0.5-2.5 (3) MG/3ML IN SOLN
3.0000 mL | Freq: Four times a day (QID) | RESPIRATORY_TRACT | Status: DC
  Administered 2018-11-15 – 2018-11-18 (×13): 3 mL via RESPIRATORY_TRACT
  Filled 2018-11-15 (×12): qty 3

## 2018-11-15 MED ORDER — ALPRAZOLAM 0.5 MG PO TABS
0.5000 mg | ORAL_TABLET | Freq: Three times a day (TID) | ORAL | Status: DC | PRN
  Administered 2018-11-15 – 2018-11-17 (×3): 0.5 mg via ORAL
  Filled 2018-11-15 (×3): qty 1

## 2018-11-15 MED ORDER — ONDANSETRON HCL 4 MG PO TABS: 4.0000 mg | ORAL_TABLET | Freq: Four times a day (QID) | ORAL | Status: DC | PRN

## 2018-11-15 MED ORDER — ENOXAPARIN SODIUM 30 MG/0.3ML ~~LOC~~ SOLN
30.0000 mg | SUBCUTANEOUS | Status: DC
  Administered 2018-11-15 – 2018-11-16 (×2): 30 mg via SUBCUTANEOUS
  Filled 2018-11-15 (×2): qty 0.3

## 2018-11-15 MED ORDER — ACETAMINOPHEN 650 MG RE SUPP: 650.0000 mg | Freq: Four times a day (QID) | RECTAL | Status: DC | PRN

## 2018-11-15 MED ORDER — SODIUM CHLORIDE 0.9 % IV SOLN
INTRAVENOUS | Status: DC
  Administered 2018-11-15 – 2018-11-16 (×2): via INTRAVENOUS

## 2018-11-15 MED ORDER — SODIUM CHLORIDE 0.9 % IV BOLUS
500.0000 mL | Freq: Once | INTRAVENOUS | Status: AC
  Administered 2018-11-15: 500 mL via INTRAVENOUS

## 2018-11-15 MED ORDER — PANTOPRAZOLE SODIUM 40 MG PO TBEC
40.0000 mg | DELAYED_RELEASE_TABLET | Freq: Every day | ORAL | Status: DC
  Administered 2018-11-15 – 2018-11-18 (×4): 40 mg via ORAL
  Filled 2018-11-15 (×4): qty 1

## 2018-11-15 MED ORDER — POTASSIUM CHLORIDE CRYS ER 20 MEQ PO TBCR
40.0000 meq | EXTENDED_RELEASE_TABLET | Freq: Once | ORAL | Status: AC
  Administered 2018-11-15: 17:00:00 40 meq via ORAL
  Filled 2018-11-15: qty 2

## 2018-11-15 MED ORDER — ACETAMINOPHEN 325 MG PO TABS: 650.0000 mg | ORAL_TABLET | Freq: Four times a day (QID) | ORAL | Status: DC | PRN

## 2018-11-15 MED ORDER — ADULT MULTIVITAMIN W/MINERALS CH
1.0000 | ORAL_TABLET | Freq: Every day | ORAL | Status: DC
  Administered 2018-11-15 – 2018-11-18 (×4): 1 via ORAL
  Filled 2018-11-15 (×4): qty 1

## 2018-11-15 MED ORDER — CITALOPRAM HYDROBROMIDE 20 MG PO TABS
20.0000 mg | ORAL_TABLET | Freq: Every day | ORAL | Status: DC
  Administered 2018-11-15 – 2018-11-16 (×2): 20 mg via ORAL
  Filled 2018-11-15 (×2): qty 1

## 2018-11-15 MED ORDER — PREDNISONE 50 MG PO TABS
25.0000 mg | ORAL_TABLET | Freq: Every day | ORAL | Status: DC
  Administered 2018-11-15 – 2018-11-16 (×2): 25 mg via ORAL
  Filled 2018-11-15 (×2): qty 1

## 2018-11-15 MED ORDER — RISAQUAD PO CAPS
1.0000 | ORAL_CAPSULE | Freq: Three times a day (TID) | ORAL | Status: DC
  Administered 2018-11-15 – 2018-11-18 (×8): 1 via ORAL
  Filled 2018-11-15 (×8): qty 1

## 2018-11-15 MED ORDER — BISACODYL 10 MG RE SUPP
10.0000 mg | Freq: Every day | RECTAL | Status: DC | PRN
  Administered 2018-11-16: 10 mg via RECTAL
  Filled 2018-11-15: qty 1

## 2018-11-15 MED ORDER — AMIODARONE HCL 200 MG PO TABS
200.0000 mg | ORAL_TABLET | Freq: Every day | ORAL | Status: DC
  Administered 2018-11-15 – 2018-11-18 (×4): 200 mg via ORAL
  Filled 2018-11-15 (×4): qty 1

## 2018-11-15 MED ORDER — ENSURE ENLIVE PO LIQD
237.0000 mL | Freq: Three times a day (TID) | ORAL | Status: DC
  Administered 2018-11-15 – 2018-11-18 (×8): 237 mL via ORAL

## 2018-11-15 MED ORDER — ONDANSETRON HCL 4 MG/2ML IJ SOLN: 4.0000 mg | Freq: Four times a day (QID) | INTRAMUSCULAR | Status: DC | PRN

## 2018-11-15 MED ORDER — ACETYLCYSTEINE 20 % IN SOLN
4.0000 mL | Freq: Four times a day (QID) | RESPIRATORY_TRACT | Status: DC
  Administered 2018-11-15 – 2018-11-18 (×10): 4 mL via RESPIRATORY_TRACT
  Filled 2018-11-15 (×14): qty 4

## 2018-11-15 MED ORDER — LEVOFLOXACIN IN D5W 750 MG/150ML IV SOLN
750.0000 mg | INTRAVENOUS | Status: DC
  Administered 2018-11-15: 20:00:00 750 mg via INTRAVENOUS
  Filled 2018-11-15: qty 150

## 2018-11-15 MED ORDER — ACETAMINOPHEN 325 MG PO TABS
650.0000 mg | ORAL_TABLET | Freq: Once | ORAL | Status: AC
  Administered 2018-11-15: 09:00:00 650 mg via ORAL
  Filled 2018-11-15: qty 2

## 2018-11-15 MED ORDER — SENNOSIDES-DOCUSATE SODIUM 8.6-50 MG PO TABS
2.0000 | ORAL_TABLET | Freq: Two times a day (BID) | ORAL | Status: DC
  Administered 2018-11-15 – 2018-11-18 (×6): 2 via ORAL
  Filled 2018-11-15 (×7): qty 2

## 2018-11-15 MED ORDER — BACID PO TABS
1.0000 | ORAL_TABLET | Freq: Three times a day (TID) | ORAL | Status: DC
  Filled 2018-11-15: qty 1

## 2018-11-15 MED ORDER — LEVOTHYROXINE SODIUM 50 MCG PO TABS
25.0000 ug | ORAL_TABLET | Freq: Every day | ORAL | Status: DC
  Administered 2018-11-15 – 2018-11-18 (×4): 25 ug via ORAL
  Filled 2018-11-15 (×4): qty 1

## 2018-11-15 MED ORDER — ONDANSETRON HCL 4 MG/2ML IJ SOLN
4.0000 mg | Freq: Once | INTRAMUSCULAR | Status: AC
  Administered 2018-11-15: 4 mg via INTRAVENOUS
  Filled 2018-11-15: qty 2

## 2018-11-15 MED ORDER — LACTINEX PO CHEW
1.0000 | CHEWABLE_TABLET | Freq: Three times a day (TID) | ORAL | Status: DC
  Filled 2018-11-15: qty 1

## 2018-11-15 MED ORDER — ALBUTEROL SULFATE HFA 108 (90 BASE) MCG/ACT IN AERS
1.0000 | INHALATION_SPRAY | Freq: Four times a day (QID) | RESPIRATORY_TRACT | Status: DC | PRN
  Filled 2018-11-15: qty 6.7

## 2018-11-15 MED ORDER — POLYETHYLENE GLYCOL 3350 17 G PO PACK
17.0000 g | PACK | Freq: Every day | ORAL | Status: DC
  Administered 2018-11-15 – 2018-11-17 (×3): 17 g via ORAL
  Filled 2018-11-15 (×4): qty 1

## 2018-11-15 NOTE — ED Triage Notes (Signed)
Pt states bilateral lower back pain with nausea that began on Friday. Pt states no dysuria, no frank hematuria, no fever.

## 2018-11-15 NOTE — ED Notes (Signed)
Patient transported to CT 

## 2018-11-15 NOTE — Progress Notes (Signed)
Family Meeting Note  Advance Directive:no  Today a meeting took place with the Patient.  Patient is able to participate.   The following clinical team members were present during this meeting:MD  The following were discussed:Patient's diagnosis: atypical pneumonia, Patient's progosis: Unable to determine and Goals for treatment: Full Code  Additional follow-up to be provided: prn  Time spent during discussion:20 minutes  Evette Doffing, MD

## 2018-11-15 NOTE — ED Notes (Signed)
Patient taken off of bed pan.

## 2018-11-15 NOTE — H&P (Signed)
[facility] NAME: Anna Mcbride    MR#:  [id number]  DATE OF BIRTH:  09-24-1945  DATE OF ADMISSION:  11/15/2018  PRIMARY CARE PHYSICIAN: Baxter Hire, MD   REQUESTING/REFERRING PHYSICIAN: Delman Kitten, MD  CHIEF COMPLAINT:   Chief Complaint  Patient presents with   Back Pain    HISTORY OF PRESENT ILLNESS:  Anna Mcbride  is a 73 y.o. 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  Of note, she was recently hospitalized from 4/7-4/11 with severe sepsis secondary to community-acquired pneumonia.  COVID testing at that time was negative.  She also developed A. fib with RVR during that hospitalization and was treated with amiodarone.  In the hospital, she has been very weak.  She feels that her breathing is mostly at baseline.  She does endorse " wet cough", but states she is not bringing anything up.  Over the last couple of days, she has developed low back pain and nausea.  She states she has been unable to take anything by mouth.  2 days ago, she had a fever to 101F.  Her home health nurse called her PCP, who recommended that she come to the ED.  Patient states that she did not come initially, because she did not want to be admitted again.  Her nausea was so bad this morning, that she decided to finally come to the ED.  She denies any chest pain.  She endorses some mild lower abdominal pain.  In the ED, vitals were unremarkable.  She was afebrile.  Labs are significant for K3.3, creatinine 1.72, hemoglobin 10.8.  UA was noninfectious.  CT chest with right-sided pleural effusion that is smaller at the base compared to previous imaging, but pleural fluid at the right apex that is larger and likely loculated.  She also has ground glass opacities in the right base and left base, likely atypical infectious process.  CT  abdomen pelvis with fecal loading throughout the colon and extensive colonic diverticulosis without diverticulitis.  Hospitalists were called for admission.  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   NASAL SINUS SURGERY  08/07/05 09/09/05   x 2    VIDEO BRONCHOSCOPY Bilateral 11/30/2014   Procedure: VIDEO BRONCHOSCOPY WITHOUT FLUORO;  Surgeon: Flora Lipps, MD;  Location: [facility] ORS;  Service: Cardiopulmonary;  Laterality: Bilateral;    SOCIAL HISTORY:   Social History   Tobacco Use   Smoking status: Never Smoker   Smokeless tobacco: Never Used  Substance Use Topics   Alcohol use: Not Currently    Frequency: Never    Comment: a couple glasses of wine a year    FAMILY HISTORY:   Family History  Problem Relation Age of Onset   Asthma Son    Congestive Heart Failure Mother  Diabetes Brother    Pulmonary embolism Father    Breast cancer Neg Hx     DRUG ALLERGIES:   Allergies  Allergen Reactions   Hydrocodone-Homatropine Itching   Tussionex Pennkinetic Er [Hydrocod Polst-Cpm Polst Er] Itching   Ciprofloxacin Rash    REVIEW OF SYSTEMS:   Review of Systems  Constitutional: Positive for fever and malaise/fatigue. Negative for chills.  HENT: Negative for congestion and sore throat.   Eyes: Negative for blurred vision and double vision.  Respiratory: Positive for cough. Negative for sputum  production and shortness of breath.   Cardiovascular: Negative for chest pain and palpitations.  Gastrointestinal: Positive for abdominal pain, constipation and nausea. Negative for diarrhea and vomiting.  Genitourinary: Negative for dysuria and urgency.  Musculoskeletal: Positive for back pain. Negative for falls and neck pain.  Neurological: Negative for dizziness and headaches.  Psychiatric/Behavioral: Negative for depression. The patient is not nervous/anxious.     MEDICATIONS AT HOME:   Prior to Admission medications   Medication Sig Start Date End Date Taking? Authorizing Provider  acetaminophen (TYLENOL) 325 MG tablet Take 2 tablets (650 mg total) by mouth every 6 (six) hours as needed for mild pain (headache). 01/08/18   Gouru, Illene Silver, MD  albuterol (PROVENTIL HFA;VENTOLIN HFA) 108 (90 BASE) MCG/ACT inhaler Inhale 1 puff into the lungs every 6 (six) hours as needed for wheezing or shortness of breath.    [provider]  ALPRAZolam Duanne Moron) 0.5 MG tablet Take 1 tablet (0.5 mg total) by mouth at bedtime. 01/08/18   [name], MD  amiodarone (PACERONE) 400 MG tablet Take 1 tablet (400 mg total) by mouth daily. 10/31/18   Salary, Avel Peace, MD  cefdinir (OMNICEF) 300 MG capsule Take 1 capsule (300 mg total) by mouth 2 (two) times daily. 10/31/18   Salary, Avel Peace, MD  citalopram (CELEXA) 20 MG tablet TAKE 1 TABLET BY MOUTH ONCE DAILY 12/16/17   Cammie Sickle, MD  feeding supplement, ENSURE ENLIVE, (ENSURE ENLIVE) LIQD Take 237 mLs by mouth 3 (three) times daily between meals. 01/08/18   [name], MD  furosemide (LASIX) 20 MG tablet Take 1 tablet (20 mg total) by mouth daily. 03/04/18   Flora Lipps, MD  guaiFENesin (MUCINEX) 600 MG 12 hr tablet Take 600 mg by mouth daily.     [provider]  ipratropium-albuterol (DUONEB) 0.5-2.5 (3) MG/3ML SOLN Take 3 mLs by nebulization every 6 (six) hours. 01/08/18   [name], MD  lactobacillus acidophilus & bulgar  (LACTINEX) chewable tablet Chew 1 tablet by mouth 3 (three) times daily with meals. 10/31/18   Salary, Avel Peace, MD  levothyroxine (SYNTHROID, LEVOTHROID) 25 MCG tablet TAKE 1 TABLET BY MOUTH ONCE DAILY ON AN EMPTY STOMACH. WAIT 30 MINUTES BEFORE TAKING OTHER MEDS. 12/03/17   Cammie Sickle, MD  Multiple Vitamin (MULTIVITAMIN WITH MINERALS) TABS tablet Take 1 tablet by mouth daily. 01/09/18   [name], MD  Multiple Vitamins-Minerals (CENTRUM ADULTS PO) Take 1 tablet by mouth daily. 01/09/18   [provider]  omeprazole (PRILOSEC) 20 MG capsule TAKE 1 CAPSULE BY MOUTH TWICE DAILY BEFORE MEALS 07/07/18   Cammie Sickle, MD  potassium chloride SA (K-DUR,KLOR-CON) 20 MEQ tablet Take 1 tablet (20 mEq total) by mouth daily. 10/27/17   Cammie Sickle, MD  predniSONE (DELTASONE) 10 MG tablet Take 1 tablet by mouth daily. 02/25/18   [provider]  Respiratory Therapy Supplies (FLUTTER) DEVI Use 10-15 times daily 07/29/16   Flora Lipps, MD  vitamin C (ASCORBIC ACID) 500 MG tablet Take 1,000 mg by mouth daily.     [provider]      VITAL SIGNS:  Blood pressure 104/60, pulse 88, temperature 98.1 F (36.7 C), temperature source Oral, resp. rate 20, SpO2 100 %.  PHYSICAL EXAMINATION:  Physical Exam  GENERAL:  73 y.o.-year-old patient lying in the bed with no acute distress.  EYES: Pupils equal, round, reactive to light and accommodation. No scleral icterus. Extraocular muscles intact.  HEENT: Head atraumatic, normocephalic. Oropharynx and nasopharynx clear. + Dry mucous membranes. NECK:  Supple, no jugular venous distention. No thyroid enlargement, no tenderness.  LUNGS: + Occasional inspiratory and expiratory wheeze auscultated throughout the right lung.  Diminished breath sounds in the lung bases bilaterally.  No use of accessory muscles of respiration.  CARDIOVASCULAR: RRR, S1, S2 normal. No murmurs, rubs, or gallops.  ABDOMEN: Soft, nontender,  nondistended. Bowel sounds present. No organomegaly or mass.  BACK: Diffuse tenderness to palpation across the low back.  No CVA tenderness. EXTREMITIES: No pedal edema, cyanosis, or clubbing.  NEUROLOGIC: Cranial nerves II through XII are intact. + Global weakness. Sensation intact. Gait not checked.  PSYCHIATRIC: The patient is alert and oriented x 3.  SKIN: No obvious rash, lesion, or ulcer.   LABORATORY PANEL:   CBC Recent Labs  Lab 11/15/18 0826  WBC 8.8  HGB 10.8*  HCT 33.5*  PLT 175   ------------------------------------------------------------------------------------------------------------------  Chemistries  Recent Labs  Lab 11/15/18 0826  NA 137  K 3.3*  CL 100  CO2 29  GLUCOSE 104*  BUN 20  CREATININE 1.72*  CALCIUM 8.9  AST 14*  ALT 13  ALKPHOS 60  BILITOT 0.9   ------------------------------------------------------------------------------------------------------------------  Cardiac Enzymes No results for input(s): TROPONINI in the last 168 hours. ------------------------------------------------------------------------------------------------------------------  RADIOLOGY:  Ct Abdomen Pelvis Wo Contrast  Result Date: 11/15/2018 CLINICAL DATA:  Bilateral lower back pain with nausea. No dysuria or frank hematuria. Fever. Chest pain or shortness of breath. Evaluate for chronic loculated effusion. Recently in hospital with sepsis. EXAM: CT CHEST, ABDOMEN AND PELVIS WITHOUT CONTRAST TECHNIQUE: Multidetector CT imaging of the chest, abdomen and pelvis was performed following the standard protocol without IV contrast. COMPARISON:  CT of the abdomen and pelvis October 27, 2018. PET-CT January 18, 2014. FINDINGS: CT CHEST FINDINGS Cardiovascular: The heart size is normal. Coronary artery calcifications are seen in the left coronary arteries. Atherosclerotic changes are seen in the nonaneurysmal thoracic aorta. The pulmonary arteries are normal in caliber centrally.  Mediastinum/Nodes: The thyroid and esophagus are unremarkable. No definite adenopathy. The left Port-A-Cath terminates in the central SVC. Lungs/Pleura: The trachea is unremarkable. Distortion of the central airways from previous radiation again identified. Mild bronchiectasis in the lingula is identified. A nodule in the lingula on series 4, image 84 measuring 7 mm is likely mucus plugging. Similar findings were seen in the lingula on the PET-CT from 2015. Paramediastinal soft tissue is consistent with postradiation change which is similar since 2015. Rounded ground-glass is seen in the right base on series 4, image 99, new since the CT scan from October 27, 2018. Mild ground-glass in the left base on image 99 is also new. Mild more focal opacity on the left such as on image 72 is likely infectious. No other suspicious infiltrates. There is a right-sided pleural effusion which is similar to smaller in the base compared October 27, 2018. There is a component towards the apex which may be loculated, larger since 2015 and not  imaged on the April 2020 study. No definite left-sided pleural effusion. Musculoskeletal: See below CT ABDOMEN PELVIS FINDINGS Hepatobiliary: High attenuation in the gallbladder could represent sludge or stones. No wall thickening identified. No liver masses are noted. Pancreas: Unremarkable. No pancreatic ductal dilatation or surrounding inflammatory changes. Spleen: Normal in size without focal abnormality. Adrenals/Urinary Tract: Adrenal glands are unremarkable. Kidneys are normal, without renal calculi, focal lesion, or hydronephrosis. Bladder is unremarkable. Stomach/Bowel: Other than a small hiatal hernia, the stomach is normal. There is fecal material in the distal ileum. Otherwise, the small bowel is unremarkable. Extensive colonic diverticuli are identified without evidence of diverticulitis. There is moderate fecal loading throughout the colon. The appendix is normal. Vascular/Lymphatic:  Atherosclerotic changes are seen in the nonaneurysmal aorta. No adenopathy. Reproductive: Uterus and bilateral adnexa are unremarkable. Other: No abdominal wall hernia or abnormality. No abdominopelvic ascites. Musculoskeletal: No acute or significant osseous findings. IMPRESSION: 1. The right-sided pleural effusion is smaller at the base compared 2 October 27, 2018. There is also a component of pleural fluid at the right apex which is larger compared to 2015 but was not imaged on the April 2020 study. The apically component is likely loculated. 2. Rounded ground-glass opacity the right base and mild patchy ground-glass opacity in left base, not present October 27, 2018, are favored to represent an infectious process. The ground-glass nature raises the possibility of atypical infections. There is also more focal patchy opacity in the left base which is likely infectious as well. Recommend treatment with short-term follow-up to ensure resolution of these findings. 3. Post radiation changes and scarring in the chest. 4. High attenuation in the gallbladder could represent sludge or stones. There is no wall thickening or pericholecystic fluid identified. 5. Atherosclerosis in the thoracic and abdominal aorta. Coronary artery calcifications. 6. Fecal loading throughout the colon. 7. Fecal material in the distal ileum likely sequela of slow transit of uncertain etiology. 8. Extensive colonic diverticulosis seen without diverticulitis. Electronically Signed   By: Dorise Bullion III M.D   On: 11/15/2018 10:59   Ct Chest Wo Contrast  Result Date: 11/15/2018 CLINICAL DATA:  Bilateral lower back pain with nausea. No dysuria or frank hematuria. Fever. Chest pain or shortness of breath. Evaluate for chronic loculated effusion. Recently in hospital with sepsis. EXAM: CT CHEST, ABDOMEN AND PELVIS WITHOUT CONTRAST TECHNIQUE: Multidetector CT imaging of the chest, abdomen and pelvis was performed following the standard protocol  without IV contrast. COMPARISON:  CT of the abdomen and pelvis October 27, 2018. PET-CT January 18, 2014. FINDINGS: CT CHEST FINDINGS Cardiovascular: The heart size is normal. Coronary artery calcifications are seen in the left coronary arteries. Atherosclerotic changes are seen in the nonaneurysmal thoracic aorta. The pulmonary arteries are normal in caliber centrally. Mediastinum/Nodes: The thyroid and esophagus are unremarkable. No definite adenopathy. The left Port-A-Cath terminates in the central SVC. Lungs/Pleura: The trachea is unremarkable. Distortion of the central airways from previous radiation again identified. Mild bronchiectasis in the lingula is identified. A nodule in the lingula on series 4, image 84 measuring 7 mm is likely mucus plugging. Similar findings were seen in the lingula on the PET-CT from 2015. Paramediastinal soft tissue is consistent with postradiation change which is similar since 2015. Rounded ground-glass is seen in the right base on series 4, image 99, new since the CT scan from October 27, 2018. Mild ground-glass in the left base on image 99 is also new. Mild more focal opacity on the left such as on image  72 is likely infectious. No other suspicious infiltrates. There is a right-sided pleural effusion which is similar to smaller in the base compared October 27, 2018. There is a component towards the apex which may be loculated, larger since 2015 and not imaged on the April 2020 study. No definite left-sided pleural effusion. Musculoskeletal: See below CT ABDOMEN PELVIS FINDINGS Hepatobiliary: High attenuation in the gallbladder could represent sludge or stones. No wall thickening identified. No liver masses are noted. Pancreas: Unremarkable. No pancreatic ductal dilatation or surrounding inflammatory changes. Spleen: Normal in size without focal abnormality. Adrenals/Urinary Tract: Adrenal glands are unremarkable. Kidneys are normal, without renal calculi, focal lesion, or hydronephrosis.  Bladder is unremarkable. Stomach/Bowel: Other than a small hiatal hernia, the stomach is normal. There is fecal material in the distal ileum. Otherwise, the small bowel is unremarkable. Extensive colonic diverticuli are identified without evidence of diverticulitis. There is moderate fecal loading throughout the colon. The appendix is normal. Vascular/Lymphatic: Atherosclerotic changes are seen in the nonaneurysmal aorta. No adenopathy. Reproductive: Uterus and bilateral adnexa are unremarkable. Other: No abdominal wall hernia or abnormality. No abdominopelvic ascites. Musculoskeletal: No acute or significant osseous findings. IMPRESSION: 1. The right-sided pleural effusion is smaller at the base compared 2 October 27, 2018. There is also a component of pleural fluid at the right apex which is larger compared to 2015 but was not imaged on the April 2020 study. The apically component is likely loculated. 2. Rounded ground-glass opacity the right base and mild patchy ground-glass opacity in left base, not present October 27, 2018, are favored to represent an infectious process. The ground-glass nature raises the possibility of atypical infections. There is also more focal patchy opacity in the left base which is likely infectious as well. Recommend treatment with short-term follow-up to ensure resolution of these findings. 3. Post radiation changes and scarring in the chest. 4. High attenuation in the gallbladder could represent sludge or stones. There is no wall thickening or pericholecystic fluid identified. 5. Atherosclerosis in the thoracic and abdominal aorta. Coronary artery calcifications. 6. Fecal loading throughout the colon. 7. Fecal material in the distal ileum likely sequela of slow transit of uncertain etiology. 8. Extensive colonic diverticulosis seen without diverticulitis. Electronically Signed   By: Dorise Bullion III M.D   On: 11/15/2018 10:59   Dg Chest Portable 1 View  Result Date:  11/15/2018 CLINICAL DATA:  73 year old female with history of non-Hodgkin's lymphoma diagnosed in 2014. Shortness of breath. EXAM: PORTABLE CHEST 1 VIEW COMPARISON:  Chest x-ray 10/30/2018. FINDINGS: Left subclavian single-lumen porta cath with tip terminating in the distal superior vena cava. Extensive pleuroparenchymal architectural distortion and volume loss is again noted in the lungs bilaterally, most evident in the apex of the right hemithorax and in the base of the right hemithorax, similar to numerous prior examinations, most compatible with chronic scarring. No definite acute consolidative airspace disease. Chronic moderate right pleural effusion which appears partially loculated, stable compared to prior examinations. No left pleural effusion. No evidence of pulmonary edema. Heart size is normal. Aortic atherosclerosis. IMPRESSION: 1. Extensive scarring in the thorax redemonstrated with chronic moderate right partially loculated pleural effusion, similar to prior examinations, as above. No definite radiographic evidence of acute cardiopulmonary disease. Electronically Signed   By: Vinnie Langton M.D.   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 Had  a fever 2 days ago at home, but has been afebrile here. Patient is currently on home 2L O2. -CT chest with multiple areas of ground glass opacities that likely represent areas of atypical infection -Recent COVID test negative -Will start Levaquin for now -Pulmonology consult -Check procalcitonin and RVP -Continue home prednisone -DuoNebs PRN -Blood culture pending  AKI- likely due to dehydration and poor p.o. intake. -IV fluids -Check renal ultrasound -Avoid nephrotoxic agents -Holding home lasix  Nausea/poor p.o. intake- may be secondary to significant constipation.  CT abdomen  with fecal loading throughout the colon. -Stool softeners -IV antiemetics  Low back pain- may be musculoskeletal in etiology.  UA non-infectious and no CVA tenderness to suggest pyelonephritis.  No falls or trauma. -Check lumbar x-ray -If patient continues to spike fevers, may need to consider additional imaging with MRI to rule out an infectious etiology within the back -Urine culture pending  Hypokalemia- likely due to lasix -Replete and recheck  Paroxysmal atrial fibrillation-in normal sinus rhythm here -Continue home amiodarone -Patient is supposed to follow-up with cardiology as an outpatient for discussion of anticoagulation  Hypothyroidism- stable -Continue home Synthroid  Macrocytic anemia -Check anemia panel  All the records are reviewed and case discussed with ED provider. Management plans discussed with the patient, family and they are in agreement.  CODE STATUS: Full  TOTAL TIME TAKING CARE OF THIS PATIENT: 45 minutes.    Berna Spare Lakie Mclouth M.D on 11/15/2018 at 11:21 AM  Between 7am to 6pm - Pager - [phone]  After 6pm go to [email or web address] - Proofreader  [facility]  Office  [phone]  CC: Primary care physician; Baxter Hire, MD   Note: This dictation was prepared with Dragon dictation along with smaller phrase technology. Any transcriptional errors that result from this process are unintentional.

## 2018-11-15 NOTE — ED Notes (Signed)
ED TO INPATIENT HANDOFF REPORT  ED Nurse Name and Phone #: Reon Hunley 78  S Name/Age/Gender Anna Mcbride 73 y.o. female Room/Bed: ED16A/ED16A  Code Status   Code Status: Prior  Home/SNF/Other Home Patient oriented x 4 Is this baseline? yes  Triage Complete: Triage complete  Chief Complaint back pain nauseated  Triage Note Pt states bilateral lower back pain with nausea that began on Friday. Pt states no dysuria, no frank hematuria, no fever.    Allergies Allergies  Allergen Reactions  . Hydrocodone-Homatropine Itching  . Tussionex Pennkinetic Er [Hydrocod Polst-Cpm Polst Er] Itching  . Ciprofloxacin Rash    Level of Care/Admitting Diagnosis ED Disposition    ED Disposition Condition [facility]: [place] [[id number]]  Level of Care: Med-Surg [16]  Covid Evaluation: N/A  Diagnosis: Atypical pneumonia [409811]  Admitting Physician: Hyman Bible DODD [[id number]]  Attending Physician: Hyman Bible DODD [[id number]]  Estimated length of stay: past midnight tomorrow  Certification:: I certify this patient will need inpatient services for at least 2 midnights  PT Class (Do Not Modify): Inpatient [101]  PT Acc Code (Do Not Modify): Private [1]       B Medical/Surgery History Past Medical History:  Diagnosis Date  . Acquired hypothyroidism 07/01/2013  . Acute respiratory failure ([place]) 11/29/2014  . 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 Asthma   . Back pain with sciatica 05/07/2017  . Biliary sludge 12/19/2017  . Lymphoma, non-Hodgkin's ([place]) 06/30/2013  . Non Hodgkin's lymphoma ([place]) [id number]  . Personal history of chemotherapy   . Recurrent upper respiratory infection (URI) 09/01/2015  . 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 NASAL SINUS SURGERY  08/07/05 09/09/05   x 2   . VIDEO BRONCHOSCOPY Bilateral 11/30/2014   Procedure:  VIDEO BRONCHOSCOPY WITHOUT FLUORO;  Surgeon: Flora Lipps, MD;  Location: [facility] ORS;  Service: Cardiopulmonary;  Laterality: Bilateral;     A IV Location/Drains/Wounds Patient Lines/Drains/Airways Status   Active Line/Drains/Airways    Name:   Placement date:   Placement time:   Site:   Days:   Implanted Port Left Chest   -    -    Chest      Implanted Port Left Chest   -    -    Chest      Peripheral IV 11/15/18 Left Antecubital   11/15/18    0842    Antecubital   less than 1   Peripheral IV 11/15/18 Right Antecubital   11/15/18    0826    Antecubital   less than 1   External Urinary Catheter   10/29/18    2100    -   17          Intake/Output Last 24 hours  Intake/Output Summary (Last 24 hours) at 11/15/2018 1310 Last data filed at 11/15/2018 1049 Gross per 24 hour  Intake 505.71 ml  Output 75 ml  Net 430.71 ml    Labs/Imaging Results for orders placed or performed during the hospital encounter of 11/15/18 (from the past 48 hour(s))  Lactic acid, plasma     Status: None   Collection Time: 11/15/18  8:26 AM  Result Value Ref Range   Lactic Acid, Venous 1.0 0.5 - 1.9 mmol/L    Comment: Performed at [facility], [address] [place], [place] [address]  Comprehensive metabolic panel  Status: Abnormal   Collection Time: 11/15/18  8:26 AM  Result Value Ref Range   Sodium 137 135 - 145 mmol/L   Potassium 3.3 (L) 3.5 - 5.1 mmol/L   Chloride 100 98 - 111 mmol/L   CO2 29 22 - 32 mmol/L   Glucose, Bld 104 (H) 70 - 99 mg/dL   BUN 20 8 - 23 mg/dL   Creatinine, Ser 1.72 (H) 0.44 - 1.00 mg/dL   Calcium 8.9 8.9 - 10.3 mg/dL   Total Protein 6.6 6.5 - 8.1 g/dL   Albumin 3.0 (L) 3.5 - 5.0 g/dL   AST 14 (L) 15 - 41 U/L   ALT 13 0 - 44 U/L   Alkaline Phosphatase 60 38 - 126 U/L   Total Bilirubin 0.9 0.3 - 1.2 mg/dL   GFR calc non Af Amer 29 (L) >60 mL/min   GFR calc Af Amer 33 (L) >60 mL/min   Anion gap 8 5 - 15    Comment: Performed at [facility], [address] [place], [place] [address]  Lipase, blood     Status: None   Collection Time: 11/15/18  8:26 AM  Result Value Ref Range   Lipase 23 11 - 51 U/L    Comment: Performed at [facility], [address] [place], [place] [address]  CBC WITH DIFFERENTIAL     Status: Abnormal   Collection Time: 11/15/18  8:26 AM  Result Value Ref Range   WBC 8.8 4.0 - 10.5 K/uL   RBC 3.33 (L) 3.87 - 5.11 MIL/uL   Hemoglobin 10.8 (L) 12.0 - 15.0 g/dL   HCT 33.5 (L) 36.0 - 46.0 %   MCV 100.6 (H) 80.0 - 100.0 fL   MCH 32.4 26.0 - 34.0 pg   MCHC 32.2 30.0 - 36.0 g/dL   RDW 13.3 11.5 - 15.5 %   Platelets 175 150 - 400 K/uL   nRBC 0.0 0.0 - 0.2 %   Neutrophils Relative % 82 %   Neutro Abs 7.2 1.7 - 7.7 K/uL   Lymphocytes Relative 7 %   Lymphs Abs 0.7 0.7 - 4.0 K/uL   Monocytes Relative 10 %   Monocytes Absolute 0.9 0.1 - 1.0 K/uL   Eosinophils Relative 1 %   Eosinophils Absolute 0.1 0.0 - 0.5 K/uL   Basophils Relative 0 %   Basophils Absolute 0.0 0.0 - 0.1 K/uL   Immature Granulocytes 0 %   Abs Immature Granulocytes 0.02 0.00 - 0.07 K/uL    Comment: Performed at [facility], [address] [place], [place] [address]  Protime-INR     Status: None   Collection Time: 11/15/18  8:26 AM  Result Value Ref Range   Prothrombin Time 13.2 11.4 - 15.2 seconds   INR 1.0 0.8 - 1.2    Comment: (NOTE) INR goal varies based on device and disease states. Performed at [facility], [address] [place], [place] [address]   Urinalysis, Routine w reflex microscopic     Status: Abnormal   Collection Time: 11/15/18  8:26 AM  Result Value Ref Range   Color, Urine YELLOW (A) YELLOW   APPearance CLEAR (A) CLEAR   Specific Gravity, Urine 1.009 1.005 - 1.030   pH 6.0 5.0 - 8.0   Glucose, UA 50 (A) NEGATIVE mg/dL   Hgb urine dipstick NEGATIVE NEGATIVE   Bilirubin Urine NEGATIVE NEGATIVE   Ketones, ur NEGATIVE NEGATIVE mg/dL   Protein, ur 30 (A) NEGATIVE mg/dL  Nitrite  NEGATIVE NEGATIVE   Leukocytes,Ua NEGATIVE NEGATIVE   WBC, UA 0-5 0 - 5 WBC/hpf   Bacteria, UA RARE (A) NONE SEEN   Squamous Epithelial / LPF NONE SEEN 0 - 5   Mucus PRESENT     Comment: Performed at [facility], [address], [place], [place] [address]   Ct Abdomen Pelvis Wo Contrast  Result Date: 11/15/2018 CLINICAL DATA:  Bilateral lower back pain with nausea. No dysuria or frank hematuria. Fever. Chest pain or shortness of breath. Evaluate for chronic loculated effusion. Recently in hospital with sepsis. EXAM: CT CHEST, ABDOMEN AND PELVIS WITHOUT CONTRAST TECHNIQUE: Multidetector CT imaging of the chest, abdomen and pelvis was performed following the standard protocol without IV contrast. COMPARISON:  CT of the abdomen and pelvis October 27, 2018. PET-CT January 18, 2014. FINDINGS: CT CHEST FINDINGS Cardiovascular: The heart size is normal. Coronary artery calcifications are seen in the left coronary arteries. Atherosclerotic changes are seen in the nonaneurysmal thoracic aorta. The pulmonary arteries are normal in caliber centrally. Mediastinum/Nodes: The thyroid and esophagus are unremarkable. No definite adenopathy. The left Port-A-Cath terminates in the central SVC. Lungs/Pleura: The trachea is unremarkable. Distortion of the central airways from previous radiation again identified. Mild bronchiectasis in the lingula is identified. A nodule in the lingula on series 4, image 84 measuring 7 mm is likely mucus plugging. Similar findings were seen in the lingula on the PET-CT from 2015. Paramediastinal soft tissue is consistent with postradiation change which is similar since 2015. Rounded ground-glass is seen in the right base on series 4, image 99, new since the CT scan from October 27, 2018. Mild ground-glass in the left base on image 99 is also new. Mild more focal opacity on the left such as on image 72 is likely infectious. No other suspicious infiltrates. There is a right-sided pleural  effusion which is similar to smaller in the base compared October 27, 2018. There is a component towards the apex which may be loculated, larger since 2015 and not imaged on the April 2020 study. No definite left-sided pleural effusion. Musculoskeletal: See below CT ABDOMEN PELVIS FINDINGS Hepatobiliary: High attenuation in the gallbladder could represent sludge or stones. No wall thickening identified. No liver masses are noted. Pancreas: Unremarkable. No pancreatic ductal dilatation or surrounding inflammatory changes. Spleen: Normal in size without focal abnormality. Adrenals/Urinary Tract: Adrenal glands are unremarkable. Kidneys are normal, without renal calculi, focal lesion, or hydronephrosis. Bladder is unremarkable. Stomach/Bowel: Other than a small hiatal hernia, the stomach is normal. There is fecal material in the distal ileum. Otherwise, the small bowel is unremarkable. Extensive colonic diverticuli are identified without evidence of diverticulitis. There is moderate fecal loading throughout the colon. The appendix is normal. Vascular/Lymphatic: Atherosclerotic changes are seen in the nonaneurysmal aorta. No adenopathy. Reproductive: Uterus and bilateral adnexa are unremarkable. Other: No abdominal wall hernia or abnormality. No abdominopelvic ascites. Musculoskeletal: No acute or significant osseous findings. IMPRESSION: 1. The right-sided pleural effusion is smaller at the base compared 2 October 27, 2018. There is also a component of pleural fluid at the right apex which is larger compared to 2015 but was not imaged on the April 2020 study. The apically component is likely loculated. 2. Rounded ground-glass opacity the right base and mild patchy ground-glass opacity in left base, not present October 27, 2018, are favored to represent an infectious process. The ground-glass nature raises the possibility of atypical infections. There is also more focal patchy opacity in the left base  which is likely infectious  as well. Recommend treatment with short-term follow-up to ensure resolution of these findings. 3. Post radiation changes and scarring in the chest. 4. High attenuation in the gallbladder could represent sludge or stones. There is no wall thickening or pericholecystic fluid identified. 5. Atherosclerosis in the thoracic and abdominal aorta. Coronary artery calcifications. 6. Fecal loading throughout the colon. 7. Fecal material in the distal ileum likely sequela of slow transit of uncertain etiology. 8. Extensive colonic diverticulosis seen without diverticulitis. Electronically Signed   By: Dorise Bullion III M.D   On: 11/15/2018 10:59   Ct Chest Wo Contrast  Result Date: 11/15/2018 CLINICAL DATA:  Bilateral lower back pain with nausea. No dysuria or frank hematuria. Fever. Chest pain or shortness of breath. Evaluate for chronic loculated effusion. Recently in hospital with sepsis. EXAM: CT CHEST, ABDOMEN AND PELVIS WITHOUT CONTRAST TECHNIQUE: Multidetector CT imaging of the chest, abdomen and pelvis was performed following the standard protocol without IV contrast. COMPARISON:  CT of the abdomen and pelvis October 27, 2018. PET-CT January 18, 2014. FINDINGS: CT CHEST FINDINGS Cardiovascular: The heart size is normal. Coronary artery calcifications are seen in the left coronary arteries. Atherosclerotic changes are seen in the nonaneurysmal thoracic aorta. The pulmonary arteries are normal in caliber centrally. Mediastinum/Nodes: The thyroid and esophagus are unremarkable. No definite adenopathy. The left Port-A-Cath terminates in the central SVC. Lungs/Pleura: The trachea is unremarkable. Distortion of the central airways from previous radiation again identified. Mild bronchiectasis in the lingula is identified. A nodule in the lingula on series 4, image 84 measuring 7 mm is likely mucus plugging. Similar findings were seen in the lingula on the PET-CT from 2015. Paramediastinal soft tissue is consistent with  postradiation change which is similar since 2015. Rounded ground-glass is seen in the right base on series 4, image 99, new since the CT scan from October 27, 2018. Mild ground-glass in the left base on image 99 is also new. Mild more focal opacity on the left such as on image 72 is likely infectious. No other suspicious infiltrates. There is a right-sided pleural effusion which is similar to smaller in the base compared October 27, 2018. There is a component towards the apex which may be loculated, larger since 2015 and not imaged on the April 2020 study. No definite left-sided pleural effusion. Musculoskeletal: See below CT ABDOMEN PELVIS FINDINGS Hepatobiliary: High attenuation in the gallbladder could represent sludge or stones. No wall thickening identified. No liver masses are noted. Pancreas: Unremarkable. No pancreatic ductal dilatation or surrounding inflammatory changes. Spleen: Normal in size without focal abnormality. Adrenals/Urinary Tract: Adrenal glands are unremarkable. Kidneys are normal, without renal calculi, focal lesion, or hydronephrosis. Bladder is unremarkable. Stomach/Bowel: Other than a small hiatal hernia, the stomach is normal. There is fecal material in the distal ileum. Otherwise, the small bowel is unremarkable. Extensive colonic diverticuli are identified without evidence of diverticulitis. There is moderate fecal loading throughout the colon. The appendix is normal. Vascular/Lymphatic: Atherosclerotic changes are seen in the nonaneurysmal aorta. No adenopathy. Reproductive: Uterus and bilateral adnexa are unremarkable. Other: No abdominal wall hernia or abnormality. No abdominopelvic ascites. Musculoskeletal: No acute or significant osseous findings. IMPRESSION: 1. The right-sided pleural effusion is smaller at the base compared 2 October 27, 2018. There is also a component of pleural fluid at the right apex which is larger compared to 2015 but was not imaged on the April 2020 study. The  apically component is likely loculated. 2. Rounded ground-glass opacity  the right base and mild patchy ground-glass opacity in left base, not present October 27, 2018, are favored to represent an infectious process. The ground-glass nature raises the possibility of atypical infections. There is also more focal patchy opacity in the left base which is likely infectious as well. Recommend treatment with short-term follow-up to ensure resolution of these findings. 3. Post radiation changes and scarring in the chest. 4. High attenuation in the gallbladder could represent sludge or stones. There is no wall thickening or pericholecystic fluid identified. 5. Atherosclerosis in the thoracic and abdominal aorta. Coronary artery calcifications. 6. Fecal loading throughout the colon. 7. Fecal material in the distal ileum likely sequela of slow transit of uncertain etiology. 8. Extensive colonic diverticulosis seen without diverticulitis. Electronically Signed   By: Dorise Bullion III M.D   On: 11/15/2018 10:59   Dg Chest Portable 1 View  Result Date: 11/15/2018 CLINICAL DATA:  73 year old female with history of non-Hodgkin's lymphoma diagnosed in 2014. Shortness of breath. EXAM: PORTABLE CHEST 1 VIEW COMPARISON:  Chest x-ray 10/30/2018. FINDINGS: Left subclavian single-lumen porta cath with tip terminating in the distal superior vena cava. Extensive pleuroparenchymal architectural distortion and volume loss is again noted in the lungs bilaterally, most evident in the apex of the right hemithorax and in the base of the right hemithorax, similar to numerous prior examinations, most compatible with chronic scarring. No definite acute consolidative airspace disease. Chronic moderate right pleural effusion which appears partially loculated, stable compared to prior examinations. No left pleural effusion. No evidence of pulmonary edema. Heart size is normal. Aortic atherosclerosis. IMPRESSION: 1. Extensive scarring in the thorax  redemonstrated with chronic moderate right partially loculated pleural effusion, similar to prior examinations, as above. No definite radiographic evidence of acute cardiopulmonary disease. Electronically Signed   By: Vinnie Langton M.D.   On: 11/15/2018 08:10    Pending Labs Unresulted Labs (From admission, onward)    Start     Ordered   11/15/18 0744  Blood Culture (routine x 2)  BLOOD CULTURE X 2,   STAT     11/15/18 0747   Signed and Held  Basic metabolic panel  Tomorrow morning,   R     Signed and Held   Signed and Held  CBC  Tomorrow morning,   R     Signed and Held   Signed and Held  Vitamin B12  Tomorrow morning,   R     Signed and Held   Signed and Held  Folate  Tomorrow morning,   R     Signed and Held   Signed and Held  Ferritin  Tomorrow morning,   R     Signed and Held   Signed and Held  Iron and TIBC  Tomorrow morning,   R     Signed and Held   Signed and Held  Procalcitonin - Baseline  ONCE - STAT,   STAT     Signed and Held   Signed and Held  Respiratory Panel by PCR  (Respiratory virus panel with precautions)  Once,   R     Signed and Held   Signed and Held  Urine Culture  Add-on,   R     Signed and Held          Vitals/Pain Today's Vitals   11/15/18 1000 11/15/18 1030 11/15/18 1245 11/15/18 1300  BP: 124/60 104/60  (!) 101/53  Pulse: 94 88 86 86  Resp:   20   Temp:  TempSrc:      SpO2: 100% 100% 100% 100%  PainSc:        Isolation Precautions No active isolations  Medications Medications  ipratropium-albuterol (DUONEB) 0.5-2.5 (3) MG/3ML nebulizer solution 3 mL (3 mLs Nebulization Given 11/15/18 0825)  ondansetron (ZOFRAN) injection 4 mg (4 mg Intravenous Given 11/15/18 0844)  sodium chloride 0.9 % bolus 500 mL ( Intravenous Stopped 11/15/18 0930)  acetaminophen (TYLENOL) tablet 650 mg (650 mg Oral Given 11/15/18 0843)  sodium chloride 0.9 % bolus 500 mL (500 mLs Intravenous New Bag/Given 11/15/18 0958)    Mobility High fall risk       R

## 2018-11-15 NOTE — ED Provider Notes (Signed)
[facility] Emergency Department Provider Note   ____________________________________________   First MD Initiated Contact with Patient 11/15/18 [phone]     (approximate)  I have reviewed the triage vital signs and the nursing notes.   HISTORY  Chief Complaint Back Pain    HPI Anna Mcbride is a 73 y.o. 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  She denies any new lung problem such as cough, difficulty breathing.  No chest pain.  Since Friday she began feeling nauseated, she also started having pain in her lower back on both sides where she describes it is in her "kidney area".  She felt like she had a fever to 100 Fahrenheit yesterday.  No sore throat, no muscle aches, no cough.  No chest pain.  No headache or neck pain.  No rash.  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    Patient Active Problem List   Diagnosis Date Noted   Suspected Covid-19 Virus Infection 10/27/2018   Malnutrition of moderate degree 01/08/2018   Adjustment disorder with anxiety 01/06/2018   Epigastric abdominal pain    History of esophagitis 12/29/2017   GERD (gastroesophageal reflux disease) 12/19/2017    Biliary sludge 12/19/2017   CKD (chronic kidney disease), stage III ([place]) 12/19/2017   Chronic respiratory failure with hypoxia, on home O2 therapy ([place]) 12/16/2017   Back pain with sciatica 05/07/2017   Severe sepsis ([place]) 09/12/2016   Pneumothorax    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   Recurrent upper respiratory infection (URI) 09/01/2015   Acute respiratory failure with hypoxia (HCC)    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   SOB (shortness of breath)    CAP (community acquired pneumonia)    Acute respiratory failure ([place]) 11/29/2014   History of digestive disease 11/29/2014   HLD (hyperlipidemia) 11/29/2014   Adult hypothyroidism 11/29/2014   Lymphoma ([place]) 11/29/2014   RAD (reactive airway disease) 11/29/2014   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   Pain in shoulder 11/01/2014   Fracture of humerus, proximal 11/01/2014   Febrile neutropenia ([place]) 11/08/2013   Decreased potassium in the blood 09/02/2013   Lymphoma, non-Hodgkin's ([place]) 06/30/2013   Cervical lymphadenopathy 04/20/2013   History of lymphoma 04/20/2013   Dyspnea 01/29/2011   Cough 01/29/2011    Past Surgical History:  Procedure Laterality Date   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 Authorizing Provider  acetaminophen (TYLENOL) 325 MG tablet Take 2 tablets (650 mg total) by mouth every 6 (six) hours as needed for mild pain (headache). 01/08/18   Gouru, Illene Silver, MD  albuterol (PROVENTIL HFA;VENTOLIN HFA) 108 (90 BASE) MCG/ACT inhaler Inhale 1 puff into the lungs every 6 (six) hours as needed for wheezing or shortness of breath.     [provider]  ALPRAZolam Duanne Moron) 0.5 MG tablet Take 1 tablet (0.5 mg total) by mouth at bedtime. 01/08/18   [name], MD  amiodarone (PACERONE) 400 MG tablet Take 1 tablet (400 mg total) by mouth daily. 10/31/18   Salary, Avel Peace, MD  cefdinir (OMNICEF) 300 MG capsule Take 1 capsule (300 mg total) by mouth 2 (two) times daily. 10/31/18   Salary, Avel Peace, MD  citalopram (CELEXA) 20 MG tablet TAKE 1 TABLET BY MOUTH ONCE DAILY 12/16/17   Cammie Sickle, MD  feeding supplement, ENSURE ENLIVE, (ENSURE ENLIVE) LIQD Take 237 mLs by mouth 3 (three) times daily between meals. 01/08/18   [name], MD  furosemide (LASIX) 20 MG tablet Take 1 tablet (20 mg total) by mouth daily. 03/04/18   Flora Lipps, MD  guaiFENesin (MUCINEX) 600 MG 12 hr tablet Take 600 mg by mouth daily.     [provider]  ipratropium-albuterol (DUONEB) 0.5-2.5 (3) MG/3ML SOLN Take 3 mLs by nebulization every 6 (six) hours. 01/08/18   [name], MD  lactobacillus acidophilus & bulgar (LACTINEX) chewable tablet Chew 1 tablet by mouth 3 (three) times daily with meals. 10/31/18   Salary, Avel Peace, MD  levothyroxine (SYNTHROID, LEVOTHROID) 25 MCG tablet TAKE 1 TABLET BY MOUTH ONCE DAILY ON AN EMPTY STOMACH. WAIT 30 MINUTES BEFORE TAKING OTHER MEDS. 12/03/17   Cammie Sickle, MD  Multiple Vitamin (MULTIVITAMIN WITH MINERALS) TABS tablet Take 1 tablet by mouth daily. 01/09/18   [name], MD  Multiple Vitamins-Minerals (CENTRUM ADULTS PO) Take 1 tablet by mouth daily. 01/09/18   [provider]  omeprazole (PRILOSEC) 20 MG capsule TAKE 1 CAPSULE BY MOUTH TWICE DAILY BEFORE MEALS 07/07/18   Cammie Sickle, MD  potassium chloride SA (K-DUR,KLOR-CON) 20 MEQ tablet Take 1 tablet (20 mEq total) by mouth daily. 10/27/17   Cammie Sickle, MD  predniSONE (DELTASONE) 10 MG tablet Take 1 tablet by mouth daily. 02/25/18   [provider]  Respiratory Therapy Supplies (FLUTTER) DEVI Use  10-15 times daily 07/29/16   Flora Lipps, MD  vitamin C (ASCORBIC ACID) 500 MG tablet Take 1,000 mg by mouth daily.     [provider]    Allergies Hydrocodone-homatropine; Tussionex pennkinetic er [employer] polst-cpm polst er]; and Ciprofloxacin  Family History  Problem Relation Age of Onset   Asthma Son    Congestive Heart Failure Mother    Diabetes Brother    Pulmonary embolism Father    Breast cancer Neg Hx     Social History Social History   Tobacco Use   Smoking status: Never Smoker   Smokeless tobacco: Never Used  Substance Use Topics   Alcohol use: Not Currently    Frequency: Never    Comment: a couple glasses of wine a year   Drug use: No    Review of Systems Constitutional: Fever and fatigue, highest temp 100 at home eyes: No visual changes. ENT: No sore throat. Cardiovascular: Denies chest pain. Respiratory: Denies shortness of breath. Gastrointestinal: No abdominal pain.  Reports pain in her lower back both sides in the "  kidney areas" Genitourinary: Negative for dysuria. Musculoskeletal: Negative for back pain except where she describes pain discomfort in her flanks, she denies pain in the middle of her back. Skin: Negative for rash. Neurological: Negative for headaches, areas of focal weakness or numbness.    ____________________________________________   PHYSICAL EXAM:  VITAL SIGNS: ED Triage Vitals  Enc Vitals Group     BP 11/15/18 0701 109/62     Pulse Rate 11/15/18 0701 91     Resp 11/15/18 0659 20     Temp 11/15/18 0659 98.1 F (36.7 C)     Temp Source 11/15/18 0659 Oral     SpO2 11/15/18 0701 99 %     Weight --      Height --      Head Circumference --      Peak Flow --      Pain Score 11/15/18 0848 0     Pain Loc --      Pain Edu? --      Excl. in [place]? --     Constitutional: Alert and oriented. Well appearing and in no acute distress.  She does have the appearance of some chronic illness. Eyes: Conjunctivae are  normal. Head: Atraumatic. Nose: No congestion/rhinnorhea. Mouth/Throat: Mucous membranes are slightly dry. Neck: No stridor.  Cardiovascular: Normal rate, regular rhythm. Grossly normal heart sounds.  Good peripheral circulation. Respiratory: Normal respiratory effort.  No retractions.  She has dry crackles bilaterally and scant end expiratory wheezing.  She is on 2 L nasal cannula and reports her breathing feels normal, she does not appear in distress.  She speaks in full and clear sentences Gastrointestinal: Soft and nontender. No distention.  She does report some mild tenderness to percussion of the CVA angles bilaterally, and she denies pain to palpation in the midline lumbar thoracic back without any lesions overlying the back. Musculoskeletal: No lower extremity tenderness nor edema. Neurologic:  Normal speech and language. No gross focal neurologic deficits are appreciated.  Skin:  Skin is warm, dry and intact. No rash noted. Psychiatric: Mood and affect are normal. Speech and behavior are normal.  ____________________________________________   LABS (all labs ordered are listed, but only abnormal results are displayed)  Labs Reviewed  COMPREHENSIVE METABOLIC PANEL - Abnormal; Notable for the following components:      Result Value   Potassium 3.3 (*)    Glucose, Bld 104 (*)    Creatinine, Ser 1.72 (*)    Albumin 3.0 (*)    AST 14 (*)    GFR calc non Af Amer 29 (*)    GFR calc Af Amer 33 (*)    All other components within normal limits  CBC WITH DIFFERENTIAL/PLATELET - Abnormal; Notable for the following components:   RBC 3.33 (*)    Hemoglobin 10.8 (*)    HCT 33.5 (*)    MCV 100.6 (*)    All other components within normal limits  URINALYSIS, ROUTINE W REFLEX MICROSCOPIC - Abnormal; Notable for the following components:   Color, Urine YELLOW (*)    APPearance CLEAR (*)    Glucose, UA 50 (*)    Protein, ur 30 (*)    Bacteria, UA RARE (*)    All other components within  normal limits  CULTURE, BLOOD (ROUTINE X 2)  CULTURE, BLOOD (ROUTINE X 2)  LACTIC ACID, PLASMA  LIPASE, BLOOD  PROTIME-INR   ____________________________________________  EKG  Reviewed entered by me at 820 Heart rate 85 QRS 130 QTc 550 Normal sinus  rhythm, left bundle branch block.  No evidence acute ischemia.  Chronic left bundle ____________________________________________  RADIOLOGY  Ct Abdomen Pelvis Wo Contrast  Result Date: 11/15/2018 CLINICAL DATA:  Bilateral lower back pain with nausea. No dysuria or frank hematuria. Fever. Chest pain or shortness of breath. Evaluate for chronic loculated effusion. Recently in hospital with sepsis. EXAM: CT CHEST, ABDOMEN AND PELVIS WITHOUT CONTRAST TECHNIQUE: Multidetector CT imaging of the chest, abdomen and pelvis was performed following the standard protocol without IV contrast. COMPARISON:  CT of the abdomen and pelvis October 27, 2018. PET-CT January 18, 2014. FINDINGS: CT CHEST FINDINGS Cardiovascular: The heart size is normal. Coronary artery calcifications are seen in the left coronary arteries. Atherosclerotic changes are seen in the nonaneurysmal thoracic aorta. The pulmonary arteries are normal in caliber centrally. Mediastinum/Nodes: The thyroid and esophagus are unremarkable. No definite adenopathy. The left Port-A-Cath terminates in the central SVC. Lungs/Pleura: The trachea is unremarkable. Distortion of the central airways from previous radiation again identified. Mild bronchiectasis in the lingula is identified. A nodule in the lingula on series 4, image 84 measuring 7 mm is likely mucus plugging. Similar findings were seen in the lingula on the PET-CT from 2015. Paramediastinal soft tissue is consistent with postradiation change which is similar since 2015. Rounded ground-glass is seen in the right base on series 4, image 99, new since the CT scan from October 27, 2018. Mild ground-glass in the left base on image 99 is also new. Mild more  focal opacity on the left such as on image 72 is likely infectious. No other suspicious infiltrates. There is a right-sided pleural effusion which is similar to smaller in the base compared October 27, 2018. There is a component towards the apex which may be loculated, larger since 2015 and not imaged on the April 2020 study. No definite left-sided pleural effusion. Musculoskeletal: See below CT ABDOMEN PELVIS FINDINGS Hepatobiliary: High attenuation in the gallbladder could represent sludge or stones. No wall thickening identified. No liver masses are noted. Pancreas: Unremarkable. No pancreatic ductal dilatation or surrounding inflammatory changes. Spleen: Normal in size without focal abnormality. Adrenals/Urinary Tract: Adrenal glands are unremarkable. Kidneys are normal, without renal calculi, focal lesion, or hydronephrosis. Bladder is unremarkable. Stomach/Bowel: Other than a small hiatal hernia, the stomach is normal. There is fecal material in the distal ileum. Otherwise, the small bowel is unremarkable. Extensive colonic diverticuli are identified without evidence of diverticulitis. There is moderate fecal loading throughout the colon. The appendix is normal. Vascular/Lymphatic: Atherosclerotic changes are seen in the nonaneurysmal aorta. No adenopathy. Reproductive: Uterus and bilateral adnexa are unremarkable. Other: No abdominal wall hernia or abnormality. No abdominopelvic ascites. Musculoskeletal: No acute or significant osseous findings. IMPRESSION: 1. The right-sided pleural effusion is smaller at the base compared 2 October 27, 2018. There is also a component of pleural fluid at the right apex which is larger compared to 2015 but was not imaged on the April 2020 study. The apically component is likely loculated. 2. Rounded ground-glass opacity the right base and mild patchy ground-glass opacity in left base, not present October 27, 2018, are favored to represent an infectious process. The ground-glass nature  raises the possibility of atypical infections. There is also more focal patchy opacity in the left base which is likely infectious as well. Recommend treatment with short-term follow-up to ensure resolution of these findings. 3. Post radiation changes and scarring in the chest. 4. High attenuation in the gallbladder could represent sludge or stones. There is no wall  thickening or pericholecystic fluid identified. 5. Atherosclerosis in the thoracic and abdominal aorta. Coronary artery calcifications. 6. Fecal loading throughout the colon. 7. Fecal material in the distal ileum likely sequela of slow transit of uncertain etiology. 8. Extensive colonic diverticulosis seen without diverticulitis. Electronically Signed   By: Dorise Bullion III M.D   On: 11/15/2018 10:59   Ct Chest Wo Contrast  Result Date: 11/15/2018 CLINICAL DATA:  Bilateral lower back pain with nausea. No dysuria or frank hematuria. Fever. Chest pain or shortness of breath. Evaluate for chronic loculated effusion. Recently in hospital with sepsis. EXAM: CT CHEST, ABDOMEN AND PELVIS WITHOUT CONTRAST TECHNIQUE: Multidetector CT imaging of the chest, abdomen and pelvis was performed following the standard protocol without IV contrast. COMPARISON:  CT of the abdomen and pelvis October 27, 2018. PET-CT January 18, 2014. FINDINGS: CT CHEST FINDINGS Cardiovascular: The heart size is normal. Coronary artery calcifications are seen in the left coronary arteries. Atherosclerotic changes are seen in the nonaneurysmal thoracic aorta. The pulmonary arteries are normal in caliber centrally. Mediastinum/Nodes: The thyroid and esophagus are unremarkable. No definite adenopathy. The left Port-A-Cath terminates in the central SVC. Lungs/Pleura: The trachea is unremarkable. Distortion of the central airways from previous radiation again identified. Mild bronchiectasis in the lingula is identified. A nodule in the lingula on series 4, image 84 measuring 7 mm is likely mucus  plugging. Similar findings were seen in the lingula on the PET-CT from 2015. Paramediastinal soft tissue is consistent with postradiation change which is similar since 2015. Rounded ground-glass is seen in the right base on series 4, image 99, new since the CT scan from October 27, 2018. Mild ground-glass in the left base on image 99 is also new. Mild more focal opacity on the left such as on image 72 is likely infectious. No other suspicious infiltrates. There is a right-sided pleural effusion which is similar to smaller in the base compared October 27, 2018. There is a component towards the apex which may be loculated, larger since 2015 and not imaged on the April 2020 study. No definite left-sided pleural effusion. Musculoskeletal: See below CT ABDOMEN PELVIS FINDINGS Hepatobiliary: High attenuation in the gallbladder could represent sludge or stones. No wall thickening identified. No liver masses are noted. Pancreas: Unremarkable. No pancreatic ductal dilatation or surrounding inflammatory changes. Spleen: Normal in size without focal abnormality. Adrenals/Urinary Tract: Adrenal glands are unremarkable. Kidneys are normal, without renal calculi, focal lesion, or hydronephrosis. Bladder is unremarkable. Stomach/Bowel: Other than a small hiatal hernia, the stomach is normal. There is fecal material in the distal ileum. Otherwise, the small bowel is unremarkable. Extensive colonic diverticuli are identified without evidence of diverticulitis. There is moderate fecal loading throughout the colon. The appendix is normal. Vascular/Lymphatic: Atherosclerotic changes are seen in the nonaneurysmal aorta. No adenopathy. Reproductive: Uterus and bilateral adnexa are unremarkable. Other: No abdominal wall hernia or abnormality. No abdominopelvic ascites. Musculoskeletal: No acute or significant osseous findings. IMPRESSION: 1. The right-sided pleural effusion is smaller at the base compared 2 October 27, 2018. There is also a  component of pleural fluid at the right apex which is larger compared to 2015 but was not imaged on the April 2020 study. The apically component is likely loculated. 2. Rounded ground-glass opacity the right base and mild patchy ground-glass opacity in left base, not present October 27, 2018, are favored to represent an infectious process. The ground-glass nature raises the possibility of atypical infections. There is also more focal patchy opacity in the left  base which is likely infectious as well. Recommend treatment with short-term follow-up to ensure resolution of these findings. 3. Post radiation changes and scarring in the chest. 4. High attenuation in the gallbladder could represent sludge or stones. There is no wall thickening or pericholecystic fluid identified. 5. Atherosclerosis in the thoracic and abdominal aorta. Coronary artery calcifications. 6. Fecal loading throughout the colon. 7. Fecal material in the distal ileum likely sequela of slow transit of uncertain etiology. 8. Extensive colonic diverticulosis seen without diverticulitis. Electronically Signed   By: Dorise Bullion III M.D   On: 11/15/2018 10:59   Dg Chest Portable 1 View  Result Date: 11/15/2018 CLINICAL DATA:  73 year old female with history of non-Hodgkin's lymphoma diagnosed in 2014. Shortness of breath. EXAM: PORTABLE CHEST 1 VIEW COMPARISON:  Chest x-ray 10/30/2018. FINDINGS: Left subclavian single-lumen porta cath with tip terminating in the distal superior vena cava. Extensive pleuroparenchymal architectural distortion and volume loss is again noted in the lungs bilaterally, most evident in the apex of the right hemithorax and in the base of the right hemithorax, similar to numerous prior examinations, most compatible with chronic scarring. No definite acute consolidative airspace disease. Chronic moderate right pleural effusion which appears partially loculated, stable compared to prior examinations. No left pleural effusion.  No evidence of pulmonary edema. Heart size is normal. Aortic atherosclerosis. IMPRESSION: 1. Extensive scarring in the thorax redemonstrated with chronic moderate right partially loculated pleural effusion, similar to prior examinations, as above. No definite radiographic evidence of acute cardiopulmonary disease. Electronically Signed   By: Vinnie Langton M.D.   On: 11/15/2018 08:10    CT scans reviewed, discussed with Dr. Brett Albino ____________________________________________   PROCEDURES  Procedure(s) performed: None  Procedures  Critical Care performed: No  ____________________________________________   INITIAL IMPRESSION / ASSESSMENT AND PLAN / ED COURSE  Pertinent labs & imaging results that were available during my care of the patient were reviewed by me and considered in my medical decision making (see chart for details).   Patient presents for reported fever at home and bilateral lower back pain mostly in the renal regions of the posterior backslash CVA angles.  She is afebrile here with reassuring vital signs and reports normal baseline respiratory status.  However, I would consider given her fever and recent community-acquired pneumonia that we should exclude recurrent or worsening pneumonia given her report of a fever and recent sepsis.  For this reason I ordered a CT scan of the chest abdomen and pelvis, CT of the chest performed primarily because of the scarring that is chronic on her x-ray that would possibly hide pneumonia and make it hard to evaluate further.  I think we will also check urinalysis, basic labs, give IV fluids and Tylenol which the patient is requesting for the discomfort.  She does not appear acutely toxic, but I am concerned given her history of recent sepsis and thus we will send blood cultures and extensive work-up at this time.     Confirm pulmonary consult with Dr. Chancy Milroy  Will admit for acute kidney injury also in the setting of apparent worsening of  infiltrative process on chest CT, unclear if she has some sort of underlying atypical or indolent lung infection and pulmonary will see her in consultation. ____________________________________________   FINAL CLINICAL IMPRESSION(S) / ED DIAGNOSES  Final diagnoses:  AKI (acute kidney injury) ([place])  Opacity of lung on imaging study        Note:  This document was prepared using Dragon voice recognition  software and may include unintentional dictation errors       Delman Kitten, MD 11/15/18 1323

## 2018-11-15 NOTE — ED Notes (Signed)
Pt placed on bedpan at this time.

## 2018-11-15 NOTE — ED Notes (Signed)
Pt given phone to contact husband at this time

## 2018-11-16 ENCOUNTER — Inpatient Hospital Stay: Payer: Medicare Other

## 2018-11-16 LAB — BASIC METABOLIC PANEL
Anion gap: 8 (ref 5–15)
BUN: 22 mg/dL (ref 8–23)
CO2: 26 mmol/L (ref 22–32)
Calcium: 8.9 mg/dL (ref 8.9–10.3)
Chloride: 106 mmol/L (ref 98–111)
Creatinine, Ser: 1.4 mg/dL — ABNORMAL HIGH (ref 0.44–1.00)
GFR calc Af Amer: 43 mL/min — ABNORMAL LOW (ref >60)
GFR calc non Af Amer: 37 mL/min — ABNORMAL LOW (ref >60)
Glucose, Bld: 130 mg/dL — ABNORMAL HIGH (ref 70–99)
Potassium: 4.2 mmol/L (ref 3.5–5.1)
Sodium: 140 mmol/L (ref 135–145)

## 2018-11-16 LAB — CBC
HCT: 30.5 % — ABNORMAL LOW (ref 36.0–46.0)
Hemoglobin: 9.6 g/dL — ABNORMAL LOW (ref 12.0–15.0)
MCH: 32.3 pg (ref 26.0–34.0)
MCHC: 31.5 g/dL (ref 30.0–36.0)
MCV: 102.7 fL — ABNORMAL HIGH (ref 80.0–100.0)
Platelets: 158 10*3/uL (ref 150–400)
RBC: 2.97 MIL/uL — ABNORMAL LOW (ref 3.87–5.11)
RDW: 13.4 % (ref 11.5–15.5)
WBC: 8.3 10*3/uL (ref 4.0–10.5)
nRBC: 0 % (ref 0.0–0.2)

## 2018-11-16 LAB — URINE CULTURE: Culture: NO GROWTH

## 2018-11-16 LAB — FERRITIN: Ferritin: 847 ng/mL — ABNORMAL HIGH (ref 11–307)

## 2018-11-16 LAB — FOLATE: Folate: 30 ng/mL (ref >5.9)

## 2018-11-16 LAB — IRON AND TIBC
Iron: 21 ug/dL — ABNORMAL LOW (ref 28–170)
Saturation Ratios: 11 % (ref 10.4–31.8)
TIBC: 185 ug/dL — ABNORMAL LOW (ref 250–450)
UIBC: 164 ug/dL

## 2018-11-16 LAB — VITAMIN B12: Vitamin B-12: 497 pg/mL (ref 180–914)

## 2018-11-16 MED ORDER — LEVOFLOXACIN IN D5W 500 MG/100ML IV SOLN
500.0000 mg | INTRAVENOUS | Status: DC
  Filled 2018-11-16: qty 100

## 2018-11-16 NOTE — Progress Notes (Signed)
PHARMACY NOTE:  ANTIMICROBIAL RENAL DOSAGE ADJUSTMENT  Current antimicrobial regimen includes a mismatch between antimicrobial dosage and estimated renal function.  As per policy approved by the Pharmacy & Therapeutics and Medical Executive Committees, the antimicrobial dosage will be adjusted accordingly.  Current antimicrobial dosage:  Levofloxacin 750 mg q48H   Indication: HCPA  Renal Function:  Estimated Creatinine Clearance: 29.2 mL/min (A) (by C-G formula based on SCr of 1.4 mg/dL (H)).     Antimicrobial dosage has been changed to:  Levofloxacin 500 mg q48H   Thank you for allowing pharmacy to be a part of this patient's care.  Oswald Hillock, [facility] 11/16/2018 2:22 PM

## 2018-11-16 NOTE — Progress Notes (Signed)
[facility] [place] NAME: Anna Mcbride    MR#:  [id number]  DATE OF BIRTH:  [birth year]  SUBJECTIVE: Admitted for back pain, difficulty breathing,  CHIEF COMPLAINT:   Chief Complaint  Patient presents with  . 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  Patient was here recently from April 7-11, call with test was negative at that time.  Started on nebulizers, steroids, antibiotics.  Spoke with Dr. Letitia Libra pulmonary who will see the patient.  Patient says that she feels slightly better and wants to go home as its her birthday day tomorrow. REVIEW OF SYSTEMS:   ROS CONSTITUTIONAL: No fever, fatigue or weakness.  EYES: No blurred or double vision.  EARS, NOSE, AND THROAT: No tinnitus or ear pain.  RESPIRATORY:  fall CARDIOVASCULAR: No chest pain, orthopnea, edema.  GASTROINTESTINAL: No nausea, vomiting, diarrhea or abdominal pain.  GENITOURINARY: No dysuria, hematuria.  ENDOCRINE: No polyuria, nocturia,  HEMATOLOGY: No anemia, easy bruising or bleeding SKIN: No rash or lesion. MUSCULOSKELETAL: No joint pain or arthritis.   NEUROLOGIC: No tingling, numbness, weakness.  PSYCHIATRY: No anxiety or depression.   DRUG ALLERGIES:   Allergies  Allergen Reactions  . Hydrocodone-Homatropine Itching  . Tussionex Pennkinetic Er [Hydrocod Polst-Cpm Polst Er] Itching  . Ciprofloxacin Rash    VITALS:  Blood pressure 99/64, pulse (!) 102, temperature (!) 97.4 F (36.3 C), temperature source Oral, resp. rate 20, height 5\' 3"  (1.6 m), weight 53 kg, SpO2 100 %.  PHYSICAL EXAMINATION:  GENERAL:  73 y.o.-year-old patient lying in the bed with no acute distress.  Chronically ill looking EYES: Pupils equal, round, reactive to light . No scleral icterus. Extraocular muscles intact.  HEENT: Head atraumatic,  normocephalic. Oropharynx and nasopharynx clear.  NECK:  Supple, no jugular venous distention. No thyroid enlargement, no tenderness.  LUNGS: coarse breath sounds bilaterally.  CARDIOVASCULAR: S1, S2 slightly tachycardic. No murmurs, rubs, or gallops.  ABDOMEN: Soft, nontender, nondistended. Bowel sounds present. No organomegaly or mass.  EXTREMITIES: No pedal edema, cyanosis, or clubbing.  NEUROLOGIC: Cranial nerves II through XII are intact. Muscle strength 5/5 in all extremities. Sensation intact. Gait not checked.  PSYCHIATRIC: The patient is alert and oriented x 3.  SKIN: No obvious rash, lesion, or ulcer.    LABORATORY PANEL:   CBC Recent Labs  Lab 11/16/18 0536  WBC 8.3  HGB 9.6*  HCT 30.5*  PLT 158   ------------------------------------------------------------------------------------------------------------------  Chemistries  Recent Labs  Lab 11/15/18 0826 11/16/18 0536  NA 137 140  K 3.3* 4.2  CL 100 106  CO2 29 26  GLUCOSE 104* 130*  BUN 20 22  CREATININE 1.72* 1.40*  CALCIUM 8.9 8.9  AST 14*  --   ALT 13  --   ALKPHOS 60  --   BILITOT 0.9  --    ------------------------------------------------------------------------------------------------------------------  Cardiac Enzymes No results for input(s): TROPONINI in the last 168 hours. ------------------------------------------------------------------------------------------------------------------  RADIOLOGY:  Ct Abdomen Pelvis Wo Contrast  Result Date: 11/15/2018 CLINICAL DATA:  Bilateral lower back pain with nausea. No dysuria or frank hematuria. Fever. Chest pain or shortness of breath. Evaluate for chronic loculated effusion. Recently in hospital with sepsis. EXAM: CT CHEST, ABDOMEN AND PELVIS WITHOUT CONTRAST TECHNIQUE: Multidetector CT imaging of the chest, abdomen and pelvis was performed following the standard protocol without IV contrast. COMPARISON:  CT  of the abdomen and pelvis October 27, 2018.  PET-CT January 18, 2014. FINDINGS: CT CHEST FINDINGS Cardiovascular: The heart size is normal. Coronary artery calcifications are seen in the left coronary arteries. Atherosclerotic changes are seen in the nonaneurysmal thoracic aorta. The pulmonary arteries are normal in caliber centrally. Mediastinum/Nodes: The thyroid and esophagus are unremarkable. No definite adenopathy. The left Port-A-Cath terminates in the central SVC. Lungs/Pleura: The trachea is unremarkable. Distortion of the central airways from previous radiation again identified. Mild bronchiectasis in the lingula is identified. A nodule in the lingula on series 4, image 84 measuring 7 mm is likely mucus plugging. Similar findings were seen in the lingula on the PET-CT from 2015. Paramediastinal soft tissue is consistent with postradiation change which is similar since 2015. Rounded ground-glass is seen in the right base on series 4, image 99, new since the CT scan from October 27, 2018. Mild ground-glass in the left base on image 99 is also new. Mild more focal opacity on the left such as on image 72 is likely infectious. No other suspicious infiltrates. There is a right-sided pleural effusion which is similar to smaller in the base compared October 27, 2018. There is a component towards the apex which may be loculated, larger since 2015 and not imaged on the April 2020 study. No definite left-sided pleural effusion. Musculoskeletal: See below CT ABDOMEN PELVIS FINDINGS Hepatobiliary: High attenuation in the gallbladder could represent sludge or stones. No wall thickening identified. No liver masses are noted. Pancreas: Unremarkable. No pancreatic ductal dilatation or surrounding inflammatory changes. Spleen: Normal in size without focal abnormality. Adrenals/Urinary Tract: Adrenal glands are unremarkable. Kidneys are normal, without renal calculi, focal lesion, or hydronephrosis. Bladder is unremarkable. Stomach/Bowel: Other than a small hiatal hernia, the  stomach is normal. There is fecal material in the distal ileum. Otherwise, the small bowel is unremarkable. Extensive colonic diverticuli are identified without evidence of diverticulitis. There is moderate fecal loading throughout the colon. The appendix is normal. Vascular/Lymphatic: Atherosclerotic changes are seen in the nonaneurysmal aorta. No adenopathy. Reproductive: Uterus and bilateral adnexa are unremarkable. Other: No abdominal wall hernia or abnormality. No abdominopelvic ascites. Musculoskeletal: No acute or significant osseous findings. IMPRESSION: 1. The right-sided pleural effusion is smaller at the base compared 2 October 27, 2018. There is also a component of pleural fluid at the right apex which is larger compared to 2015 but was not imaged on the April 2020 study. The apically component is likely loculated. 2. Rounded ground-glass opacity the right base and mild patchy ground-glass opacity in left base, not present October 27, 2018, are favored to represent an infectious process. The ground-glass nature raises the possibility of atypical infections. There is also more focal patchy opacity in the left base which is likely infectious as well. Recommend treatment with short-term follow-up to ensure resolution of these findings. 3. Post radiation changes and scarring in the chest. 4. High attenuation in the gallbladder could represent sludge or stones. There is no wall thickening or pericholecystic fluid identified. 5. Atherosclerosis in the thoracic and abdominal aorta. Coronary artery calcifications. 6. Fecal loading throughout the colon. 7. Fecal material in the distal ileum likely sequela of slow transit of uncertain etiology. 8. Extensive colonic diverticulosis seen without diverticulitis. Electronically Signed   By: Dorise Bullion III M.D   On: 11/15/2018 10:59   Dg Lumbar Spine 2-3 Views  Result Date: 11/15/2018 CLINICAL DATA:  73 year old admitted for acute kidney injury and possible atypical  pneumonia, with chronic low back  pain. EXAM: LUMBAR SPINE - 2-3 VIEW COMPARISON:  Bone window images from CT chest abdomen and pelvis performed earlier same day. FINDINGS: Five non-rib-bearing lumbar vertebrae with anatomic alignment. No fractures. Mild disc space narrowing at L3-4. Remaining disc spaces well-preserved. Facet degenerative changes at L3-4, L4-5 and L5-S1. Sacroiliac joints anatomically aligned without significant degenerative change. Aortoiliac atherosclerosis. IMPRESSION: 1. No acute osseous abnormality. 2. Mild degenerative disc disease at L3-4. 3. Facet degenerative changes at L3-4, L4-5 and L5-S1. Electronically Signed   By: Evangeline Dakin M.D.   On: 11/15/2018 18:48   Ct Chest Wo Contrast  Result Date: 11/15/2018 CLINICAL DATA:  Bilateral lower back pain with nausea. No dysuria or frank hematuria. Fever. Chest pain or shortness of breath. Evaluate for chronic loculated effusion. Recently in hospital with sepsis. EXAM: CT CHEST, ABDOMEN AND PELVIS WITHOUT CONTRAST TECHNIQUE: Multidetector CT imaging of the chest, abdomen and pelvis was performed following the standard protocol without IV contrast. COMPARISON:  CT of the abdomen and pelvis October 27, 2018. PET-CT January 18, 2014. FINDINGS: CT CHEST FINDINGS Cardiovascular: The heart size is normal. Coronary artery calcifications are seen in the left coronary arteries. Atherosclerotic changes are seen in the nonaneurysmal thoracic aorta. The pulmonary arteries are normal in caliber centrally. Mediastinum/Nodes: The thyroid and esophagus are unremarkable. No definite adenopathy. The left Port-A-Cath terminates in the central SVC. Lungs/Pleura: The trachea is unremarkable. Distortion of the central airways from previous radiation again identified. Mild bronchiectasis in the lingula is identified. A nodule in the lingula on series 4, image 84 measuring 7 mm is likely mucus plugging. Similar findings were seen in the lingula on the PET-CT from 2015.  Paramediastinal soft tissue is consistent with postradiation change which is similar since 2015. Rounded ground-glass is seen in the right base on series 4, image 99, new since the CT scan from October 27, 2018. Mild ground-glass in the left base on image 99 is also new. Mild more focal opacity on the left such as on image 72 is likely infectious. No other suspicious infiltrates. There is a right-sided pleural effusion which is similar to smaller in the base compared October 27, 2018. There is a component towards the apex which may be loculated, larger since 2015 and not imaged on the April 2020 study. No definite left-sided pleural effusion. Musculoskeletal: See below CT ABDOMEN PELVIS FINDINGS Hepatobiliary: High attenuation in the gallbladder could represent sludge or stones. No wall thickening identified. No liver masses are noted. Pancreas: Unremarkable. No pancreatic ductal dilatation or surrounding inflammatory changes. Spleen: Normal in size without focal abnormality. Adrenals/Urinary Tract: Adrenal glands are unremarkable. Kidneys are normal, without renal calculi, focal lesion, or hydronephrosis. Bladder is unremarkable. Stomach/Bowel: Other than a small hiatal hernia, the stomach is normal. There is fecal material in the distal ileum. Otherwise, the small bowel is unremarkable. Extensive colonic diverticuli are identified without evidence of diverticulitis. There is moderate fecal loading throughout the colon. The appendix is normal. Vascular/Lymphatic: Atherosclerotic changes are seen in the nonaneurysmal aorta. No adenopathy. Reproductive: Uterus and bilateral adnexa are unremarkable. Other: No abdominal wall hernia or abnormality. No abdominopelvic ascites. Musculoskeletal: No acute or significant osseous findings. IMPRESSION: 1. The right-sided pleural effusion is smaller at the base compared 2 October 27, 2018. There is also a component of pleural fluid at the right apex which is larger compared to 2015 but  was not imaged on the April 2020 study. The apically component is likely loculated. 2. Rounded ground-glass opacity the right base and mild  patchy ground-glass opacity in left base, not present October 27, 2018, are favored to represent an infectious process. The ground-glass nature raises the possibility of atypical infections. There is also more focal patchy opacity in the left base which is likely infectious as well. Recommend treatment with short-term follow-up to ensure resolution of these findings. 3. Post radiation changes and scarring in the chest. 4. High attenuation in the gallbladder could represent sludge or stones. There is no wall thickening or pericholecystic fluid identified. 5. Atherosclerosis in the thoracic and abdominal aorta. Coronary artery calcifications. 6. Fecal loading throughout the colon. 7. Fecal material in the distal ileum likely sequela of slow transit of uncertain etiology. 8. Extensive colonic diverticulosis seen without diverticulitis. Electronically Signed   By: Dorise Bullion III M.D   On: 11/15/2018 10:59   US Renal  Result Date: 11/16/2018 CLINICAL DATA:  Acute kidney injury. EXAM: RENAL / URINARY TRACT ULTRASOUND COMPLETE COMPARISON:  CT scan of November 15, 2018. FINDINGS: Right Kidney: Renal measurements: 9.2 x 4.4 x 4.2 cm = volume: 88 mL. Mildly increased echogenicity of renal parenchyma is noted. No mass or hydronephrosis visualized. Left Kidney: Renal measurements: 9.9 x 5.4 x 4.6 cm = volume: 130 mL. Mildly increased echogenicity of renal parenchyma is noted. No mass or hydronephrosis visualized. Bladder: Appears normal for degree of bladder distention. IMPRESSION: Mildly increased echogenicity of renal parenchyma is noted bilaterally suggesting medical renal disease. No hydronephrosis or renal obstruction is noted. Electronically Signed   By: Marijo Conception M.D.   On: 11/16/2018 09:27   Dg Chest Portable 1 View  Result Date: 11/15/2018 CLINICAL DATA:  73 year old  female with history of non-Hodgkin's lymphoma diagnosed in 2014. Shortness of breath. EXAM: PORTABLE CHEST 1 VIEW COMPARISON:  Chest x-ray 10/30/2018. FINDINGS: Left subclavian single-lumen porta cath with tip terminating in the distal superior vena cava. Extensive pleuroparenchymal architectural distortion and volume loss is again noted in the lungs bilaterally, most evident in the apex of the right hemithorax and in the base of the right hemithorax, similar to numerous prior examinations, most compatible with chronic scarring. No definite acute consolidative airspace disease. Chronic moderate right pleural effusion which appears partially loculated, stable compared to prior examinations. No left pleural effusion. No evidence of pulmonary edema. Heart size is normal. Aortic atherosclerosis. IMPRESSION: 1. Extensive scarring in the thorax redemonstrated with chronic moderate right partially loculated pleural effusion, similar to prior examinations, as above. No definite radiographic evidence of acute cardiopulmonary disease. Electronically Signed   By: Vinnie Langton M.D.   On: 11/15/2018 08:10    EKG:   Orders placed or performed during the hospital encounter of 11/15/18  . ED EKG 12-Lead  . ED EKG 12-Lead    ASSESSMENT AND PLAN:   #1/Acute on chronic respiratory failure, patient felt short of breath at home with fever, spoke with pulmonary physician Dr. Doran Stabler who saw the patient for the first time on April 21, family wants him to see the patient again, patient is on prednisone, Mucomyst, bronchodilators, empiric antibiotics for atypical pneumonia, appreciate pulmonary consult, spoke with [facility]  RSV panel has been negative, COVID-19 test has been negative on April 7th,  #2 acute kidney injury secondary to dehydration, improved with IV hydration, continue IV hydration, adjusted the rate.  Renal ultrasound did not show any acute problem, creatinine is down from 1.72-1.4. 3.  Hypokalemia:  Improved. 4.  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 #5 hyperlipidemia 6 hypothyroidism: Continue Synthyroid. 7..  Paroxysmal atrial fibrillation, follows up with Dr. Ubaldo Glassing, patient is on amiodarone/ Recent admission for sepsis, Patient main complaint is fatigue, chronic respiratory issues,  More than 50% time spent in counseling, coordination of care All the records are reviewed and case discussed with Care Management/Social Workerr. Management plans discussed with the patient, family and they are in agreement.  CODE STATUS: full  TOTAL TIME TAKING CARE OF THIS PATIENT: 38 minutes.   POSSIBLE D/C IN 1-2DAYS, DEPENDING ON CLINICAL CONDITION.   Epifanio Lesches M.D on 11/16/2018 at 10:38 AM  Between 7am to 6pm - Pager - [phone]  After 6pm go to [email or web address] - password [email or web address] [facility]  Office  [phone]  CC: Primary care physician; Baxter Hire, MD   Note: This dictation was prepared with Dragon dictation along with smaller phrase technology. Any transcriptional errors that result from this process are unintentional.

## 2018-11-16 NOTE — Evaluation (Signed)
Physical Therapy Evaluation Patient Details Name: Anna Mcbride MRN: [id number] DOB: Nov 21, 1945 Today's Date: 11/16/2018   History of Present Illness  73 y.o. 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  Of note, she was recently hospitalized from 4/7-4/11 with severe sepsis secondary to community-acquired pneumonia.    Clinical Impression  Patient awake and alert on PT arrival and eager to participate in therapy with the expressed goal of going home for her birthday ([birth year] Patient able to perform all bed mobility with Mod I; transfers SBA/CGA; and ambulation SBA. Patient demonstrates strength deficits and based on gait speed likely balance deficits which will benefit from continued skilled therapeutic intervention. Patient on 2L O2 via [place] throughout session and maintained SpO2 above 93% with appropriate HR response to activity. Patient will benefit from continued PT services on discharge to home for activity tolerance, strength deficits, balance deficits, and energy conservation techniques.  Patient in chair with call bell in reach; all needs met.     Follow Up Recommendations Home health PT    Equipment Recommendations  Other (comment)(Patient has necessary DME at home)    Recommendations for Other Services       Precautions / Restrictions Restrictions Weight Bearing Restrictions: No      Mobility  Bed Mobility Overal bed mobility: Modified Independent             General bed mobility comments: Sleeps on wedge/HOB elevated.   Transfers Overall transfer level: Needs assistance Equipment used: None Transfers: Sit to/from Stand Sit to Stand: Supervision;Min guard         General transfer comment: Patient requires some increased time and SBA/CGA due to unsteadiness and fatigue tremors.   Ambulation/Gait Ambulation/Gait assistance:  Supervision Gait Distance (Feet): 30 Feet Assistive device: None Gait Pattern/deviations: WFL(Within Functional Limits);Shuffle   Gait velocity interpretation: 1.31 - 2.62 ft/sec, indicative of limited community ambulator General Gait Details: Patient has a mild shuffling gait likely due to fatigue and decreased activity tolerance 2/2 to hospitalization. No gross deficits; patient reports feeling she is roughly at her baseline. Patient does note that she uses RW for distance ambulation at baseline, but she states this is for O2 transport more than balance issues.  Stairs            Wheelchair Mobility    Modified Rankin (Stroke Patients Only)       Balance Overall balance assessment: Modified Independent                                           Pertinent Vitals/Pain Pain Assessment: No/denies pain    Home Living Family/patient expects to be discharged to:: Private residence Living Arrangements: Spouse/significant other Available Help at Discharge: Family Type of Home: House Home Access: Stairs to enter Entrance Stairs-Rails: Can reach both Entrance Stairs-Number of Steps: 2 Home Layout: Able to live on main level with bedroom/bathroom;Multi-level Home Equipment: Shower seat - built in;Grab bars - tub/shower;Walker - 4 wheels;Transport chair;Bedside commode Additional Comments: Single step into living, pt reports never using this space    Prior Function Level of Independence: Independent with assistive device(s)         Comments: Patient states that she is modified independent with all ADLs and able to participate in the community at baseline. Patient  indicates she uses supervision for showering/dressing.     Hand Dominance   Dominant Hand: Right    Extremity/Trunk Assessment   Upper Extremity Assessment Upper Extremity Assessment: Overall WFL for tasks assessed;Generalized weakness    Lower Extremity Assessment Lower Extremity  Assessment: Overall WFL for tasks assessed;Generalized weakness       Communication   Communication: No difficulties  Cognition Arousal/Alertness: Awake/alert Behavior During Therapy: WFL for tasks assessed/performed Overall Cognitive Status: Within Functional Limits for tasks assessed                                        General Comments      Exercises Other Exercises Other Exercises: Functional transfers and room ambulation: SBA/CGA for transfers and SBA for ambulation. Patient demonstrates good safety awareness and management of O2. Patient's SpO2 >94% throughout the session.   Assessment/Plan    PT Assessment Patient needs continued PT services  PT Problem List Decreased strength;Decreased mobility;Decreased range of motion;Decreased activity tolerance;Decreased balance;Decreased knowledge of use of DME       PT Treatment Interventions Functional mobility training;Balance training;Patient/family education;Neuromuscular re-education;Therapeutic activities;Gait training;Stair training;Therapeutic exercise    PT Goals (Current goals can be found in the Care Plan section)  Acute Rehab PT Goals Patient Stated Goal: go home PT Goal Formulation: With patient Time For Goal Achievement: 11/30/18 Potential to Achieve Goals: Good    Frequency Min 2X/week   Barriers to discharge        Co-evaluation               AM-PAC PT "6 Clicks" Mobility  Outcome Measure Help needed turning from your back to your side while in a flat bed without using bedrails?: A Little Help needed moving from lying on your back to sitting on the side of a flat bed without using bedrails?: A Little Help needed moving to and from a bed to a chair (including a wheelchair)?: A Little Help needed standing up from a chair using your arms (e.g., wheelchair or bedside chair)?: None Help needed to walk in hospital room?: A Little Help needed climbing 3-5 steps with a railing? : A  Little 6 Click Score: 19    End of Session Equipment Utilized During Treatment: Gait belt Activity Tolerance: Patient tolerated treatment well Patient left: in chair;with chair alarm set;with call bell/phone within reach   PT Visit Diagnosis: Difficulty in walking, not elsewhere classified (R26.2);Muscle weakness (generalized) (M62.81)    Time: 7289-7915 PT Time Calculation (min) (ACUTE ONLY): 29 min   Charges:   PT Evaluation $PT Eval Moderate Complexity: 1 Mod PT Treatments $Therapeutic Exercise: 8-22 mins       Myles Gip PT, DPT [phone] 11/16/2018, 12:34 PM

## 2018-11-16 NOTE — TOC Initial Note (Signed)
Transition of Care [facility]) - Initial/Assessment Note    Patient Details  Name: Anna Mcbride MRN: [id number] Date of Birth: Feb 12, 1946  Transition of [facility]) CM/SW Contact:    Beverly Sessions, RN Phone Number: 11/16/2018, 2:36 PM  Clinical Narrative:                 Patient admitted from home with respiratory failure.  Patient lives at home with husband.    PCP [place].  Pharmacy [facility].  Patient denies issues obtaining medication. Patient states one MD ordered "marinol" and it needs prior authorization, however patient states "I don't think I want to start that medication though after reading about it".  RNCM encouraged MD to discuss drug with her provider.  Patient open with [facility] home [facility] for RN, PT, and SW.  Cassie with [facility] notified of admission.    Patient on chronic O2 2L through [place].  Patient has her portable concentrator in her room for discharge. Patient states that she has a rollator, transport chair, BSC, and bed wedge in the home  RNCM following for discharge planning needs  Expected Discharge Plan: [place] Barriers to Discharge: Continued Medical Work up   Patient Goals and CMS Choice        Expected Discharge Plan and Services Expected Discharge Plan: [place]   Discharge Planning Services: CM Consult   Living arrangements for the past 2 months: Single Family Home                               Date [place]: 11/16/18 Time HH Agency Contacted: 82 Representative spoke with at [place]: Cassie  Prior Living Arrangements/Services Living arrangements for the past 2 months: [place] with:: Spouse   Do you feel safe going back to the place where you live?: Yes          Current home services: Home PT, Home RN, Other (comment)(SW) Criminal Activity/Legal Involvement Pertinent to Current Situation/Hospitalization: No - Comment as needed  Activities of Daily  Living Home Assistive Devices/Equipment: Eyeglasses, Oxygen, Blood pressure cuff, Wheelchair, Environmental consultant (specify type) ADL Screening (condition at time of admission) Patient's cognitive ability adequate to safely complete daily activities?: Yes Is the patient deaf or have difficulty hearing?: No Does the patient have difficulty seeing, even when wearing glasses/contacts?: No Does the patient have difficulty concentrating, remembering, or making decisions?: No Patient able to express need for assistance with ADLs?: Yes Does the patient have difficulty dressing or bathing?: No Independently performs ADLs?: Yes (appropriate for developmental age) Does the patient have difficulty walking or climbing stairs?: Yes Weakness of Legs: Both Weakness of Arms/Hands: Both  Permission Sought/Granted                  Emotional Assessment Appearance:: Appears stated age Attitude/Demeanor/Rapport: Gracious Affect (typically observed): Accepting Orientation: : Oriented to Self, Oriented to Place, Oriented to  Time, Oriented to Situation   Psych Involvement: No (comment)  Admission diagnosis:  AKI (acute kidney injury) ([place]) [N17.9] Opacity of lung on imaging study [R91.8] Patient Active Problem List   Diagnosis Date Noted  . Atypical pneumonia 11/15/2018  . Suspected Covid-19 Virus Infection 10/27/2018  . Malnutrition of moderate degree 01/08/2018  . Adjustment disorder with anxiety 01/06/2018  . Epigastric abdominal pain   . History of esophagitis 12/29/2017  . GERD (gastroesophageal reflux disease) 12/19/2017  .  Biliary sludge 12/19/2017  . CKD (chronic kidney disease), stage III ([place]) 12/19/2017  . Chronic respiratory failure with hypoxia, on home O2 therapy ([place]) 12/16/2017  . Back pain with sciatica 05/07/2017  . Severe sepsis ([place]) 09/12/2016  . Pneumothorax   . Diffuse large B-cell lymphoma of lymph nodes of neck ([place]) 02/21/2016  . Recurrent upper respiratory infection (URI)  09/01/2015  . Acute respiratory failure with hypoxia ([place])   . 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 SOB (shortness of breath)   . CAP (community acquired pneumonia)   . Acute respiratory failure ([place]) 11/29/2014  . History of digestive disease 11/29/2014  . HLD (hyperlipidemia) 11/29/2014  . Adult hypothyroidism 11/29/2014  . Lymphoma ([place]) 11/29/2014  . RAD (reactive airway disease) 11/29/2014  . Stenosis of subclavian artery ([place]) 11/29/2014  . Pain in shoulder 11/01/2014  . Fracture of humerus, proximal 11/01/2014  . Febrile neutropenia ([place]) 11/08/2013  . Decreased potassium in the blood 09/02/2013  . Lymphoma, non-Hodgkin's ([place]) 06/30/2013  . Cervical lymphadenopathy 04/20/2013  . History of lymphoma 04/20/2013  . Dyspnea 01/29/2011  . Cough 01/29/2011   PCP:  Baxter Hire, MD Pharmacy:   [facility], [address] [place] [address] Phone: [phone] Fax: [phone]     Social Determinants of Health (SDOH) Interventions    Readmission Risk Interventions Readmission Risk Prevention Plan 11/16/2018 11/16/2018 10/29/2018  Transportation Screening Complete Complete Complete  PCP or Specialist Appt within 3-5 Days - - Complete  HRI or [place] - - Complete  Social Work Consult for [place] Planning/Counseling - - Patient refused  Palliative Care Screening - - Not Applicable  Medication Review Press photographer) Complete - Complete  HRI or Home Care Consult Complete - -  Palliative Care Screening Complete - -  [place] Not Applicable - -  Some recent data might be hidden

## 2018-11-16 NOTE — Progress Notes (Signed)
PT Cancellation Note  Patient Details Name: Anna Mcbride MRN: [id number] DOB: [birth year]   Cancelled Treatment:    Reason Eval/Treat Not Completed: Patient at procedure or test/unavailable Chart reviewed. Patient not in room on PT arrival. Will follow up at a later time/date.  Myles Gip PT, DPT [phone] 11/16/2018, 9:01 AM

## 2018-11-16 NOTE — Consult Note (Signed)
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  Of note, she was recently hospitalized from 4/7-4/11 with severe sepsis secondary to community-acquired pneumonia.  COVID testing at that time was negative.  She also developed A. fib with RVR during that hospitalization and was treated with amiodarone.  In the hospital, she has been very weak.  She feels that her breathing is mostly at baseline.  She does endorse " wet cough", but states she is not bringing anything up.  Over the last couple of days, she has developed low back pain and nausea.  She states she has been unable to take anything by mouth.  2 days ago, she had a fever to 101F.  Her home health nurse called her PCP, who recommended that she come to the ED.  Patient states that she did not come initially, because she did not want to be admitted again.  Her nausea was so bad this morning, that she decided to finally come to the ED.  She denies any chest pain.  She endorses some mild lower abdominal pain.  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 She is deconditioned and spends most of her day sitting in a chair.  She denies constitutional symptoms.      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     SURGICAL HISTORY   Past Surgical History:  Procedure Laterality Date   BONE MARROW TRANSPLANT  05/15/00   NASAL SINUS SURGERY  08/07/05 09/09/05   x 2    VIDEO BRONCHOSCOPY Bilateral 11/30/2014   Procedure: VIDEO BRONCHOSCOPY WITHOUT FLUORO;  Surgeon: Flora Lipps, MD;  Location: [facility] ORS;  Service: Cardiopulmonary;  Laterality: Bilateral;     FAMILY HISTORY   Family History  Problem Relation Age of Onset   Asthma Son    Congestive Heart Failure Mother    Diabetes Brother    Pulmonary embolism Father    Breast cancer Neg Hx      SOCIAL HISTORY   Social History   Tobacco Use   Smoking status: Never Smoker   Smokeless tobacco: Never Used  Substance Use Topics   Alcohol use: Not Currently    Frequency: Never    Comment: a couple glasses of wine a year   Drug use: No     MEDICATIONS    Home Medication:    Current Medication:  Current Facility-Administered Medications:    0.9 %  sodium chloride infusion, , Intravenous, Continuous, Epifanio Lesches, MD, Last Rate: 75 mL/hr at 11/16/18 1518   acetaminophen (TYLENOL) tablet 650 mg, 650 mg, Oral, Q6H PRN **OR** acetaminophen (TYLENOL) suppository 650 mg, 650 mg, Rectal, Q6H PRN, Mayo, Pete Pelt, MD   acetylcysteine (MUCOMYST) 20 % nebulizer / oral solution 4 mL, 4 mL, Nebulization, Q6H, Mayo, Pete Pelt, MD, 4 mL at 11/16/18 1326   acidophilus (RISAQUAD) capsule 1 capsule, 1 capsule, Oral, TID WC, Cyndee Brightly M, RPH, 1 capsule at 11/16/18 1720   albuterol (VENTOLIN HFA) 108 (90 Base) MCG/ACT inhaler 1 puff, 1 puff, Inhalation, Q6H PRN, Mayo, Pete Pelt, MD   ALPRAZolam Duanne Moron) tablet 0.5 mg, 0.5 mg, Oral, TID PRN, Mayo, Pete Pelt, MD, 0.5 mg at 11/15/18 2209   amiodarone (PACERONE) tablet 200 mg, 200 mg,  Oral, Daily, Mayo, Pete Pelt, MD, 200 mg at 11/16/18 8841   bisacodyl (DULCOLAX) suppository 10 mg, 10 mg, Rectal, Daily PRN, Mayo, Pete Pelt, MD, 10 mg at 11/16/18 1503   enoxaparin (LOVENOX) injection 30 mg, 30 mg, Subcutaneous, Q24H, Mayo, Pete Pelt, MD, 30 mg at 11/16/18 1720   feeding supplement (ENSURE ENLIVE) (ENSURE ENLIVE) liquid 237 mL, 237 mL, Oral, TID BM, Mayo, Pete Pelt, MD, 237 mL at 11/16/18 1307   ipratropium-albuterol (DUONEB) 0.5-2.5 (3) MG/3ML nebulizer solution 3 mL, 3 mL, Nebulization, Q6H, Mayo, Pete Pelt, MD, 3 mL at 11/16/18 1326   [START ON 11/17/2018] levofloxacin (LEVAQUIN) IVPB 500 mg, 500 mg, Intravenous, Q48H, Epifanio Lesches, MD   levothyroxine (SYNTHROID) tablet 25 mcg, 25 mcg, Oral, Q0600, Mayo, Pete Pelt, MD, 25 mcg at 11/16/18 6606   multivitamin with minerals tablet 1 tablet, 1 tablet, Oral, Daily, Mayo, Pete Pelt, MD, 1 tablet at 11/16/18 0927   ondansetron (ZOFRAN) tablet 4 mg, 4 mg, Oral, Q6H PRN **OR** ondansetron (ZOFRAN) injection 4 mg, 4 mg, Intravenous, Q6H PRN, Mayo, Pete Pelt, MD   pantoprazole (PROTONIX) EC tablet 40 mg, 40 mg, Oral, Daily, Mayo, Pete Pelt, MD, 40 mg at 11/16/18 0927   polyethylene glycol (MIRALAX / GLYCOLAX) packet 17 g, 17 g, Oral, Daily, Mayo, Pete Pelt, MD, 17 g at 11/16/18 3016   predniSONE (DELTASONE) tablet 25 mg, 25 mg, Oral, Daily, Mayo, Pete Pelt, MD, 25 mg at 11/16/18 0109   senna-docusate (Senokot-S) tablet 2 tablet, 2 tablet, Oral, BID, Mayo, Pete Pelt, MD, 2 tablet at 11/16/18 3235  Facility-Administered Medications Ordered in Other Encounters:    heparin lock flush 100 unit/mL, 500 Units, Intravenous, Once, Brahmanday, Lenetta Quaker R, MD   sodium chloride flush (NS) 0.9 % injection 10 mL, 10 mL, Intravenous, Once, Cammie Sickle, MD    ALLERGIES   Hydrocodone-homatropine; Tussionex pennkinetic er [employer] polst-cpm polst er]; and Ciprofloxacin     REVIEW OF SYSTEMS    Review of  Systems:  Gen:  Denies  fever, sweats, chills weigh loss  HEENT: Denies blurred vision, double vision, ear pain, eye pain, hearing loss, nose bleeds, sore throat Cardiac:  No dizziness, chest pain or heaviness, chest tightness,edema Resp:   Denies cough or sputum porduction, shortness of breath,wheezing, hemoptysis,  Gi: Denies swallowing difficulty, stomach pain, nausea or vomiting, diarrhea, constipation, bowel incontinence Gu:  Denies bladder incontinence, burning urine Ext:   Denies Joint pain, stiffness or swelling Skin: Denies  skin rash, easy bruising or bleeding or hives Endoc:  Denies polyuria, polydipsia , polyphagia or weight change Psych:   Denies depression, insomnia or hallucinations   Other:  All other systems negative  VS: BP 100/60 (BP Location: Right Arm)    Pulse (!) 103    Temp 98.3 F (36.8 C) (Oral)    Resp 20    Ht 5\' 3"  (1.6 m)    Wt 53 kg    LMP  (LMP Unknown)    SpO2 100%    BMI 20.70 kg/m      PHYSICAL EXAM    GENERAL:NAD, no fevers, chills, no weakness no fatigue HEAD: Normocephalic, atraumatic.  EYES: Pupils equal, round, reactive to light. Extraocular muscles intact. No scleral icterus.  MOUTH: Moist mucosal membrane. Dentition intact. No abscess noted.  EAR, NOSE, THROAT: Clear without exudates. No external lesions.  NECK: Supple. No thyromegaly. No nodules. No JVD.  PULMONARY: Mild rhonchi bilaterally no wheezing CARDIOVASCULAR: S1 and S2. Regular rate and rhythm. No murmurs, rubs, or gallops. No edema. Pedal pulses 2+ bilaterally.  GASTROINTESTINAL: Soft, nontender, nondistended. No masses. Positive bowel sounds. No hepatosplenomegaly.  MUSCULOSKELETAL: No swelling, clubbing, or edema. Range of motion full in all extremities.  NEUROLOGIC: Cranial nerves II through XII are intact. No gross focal neurological deficits. Sensation intact. Reflexes intact.  SKIN: No ulceration, lesions, rashes, or cyanosis. Skin warm and dry. Turgor intact.   PSYCHIATRIC: Mood, affect within normal limits. The patient is awake, alert and oriented x 3. Insight, judgment intact.       IMAGING    Ct Abdomen Pelvis Wo Contrast  Result Date: 11/15/2018 CLINICAL DATA:  Bilateral lower back pain with nausea. No dysuria or frank hematuria. Fever. Chest pain or shortness of breath. Evaluate for chronic loculated effusion. Recently in hospital with sepsis. EXAM: CT CHEST, ABDOMEN AND PELVIS WITHOUT CONTRAST TECHNIQUE: Multidetector CT imaging of the chest, abdomen and pelvis was performed following the standard protocol without IV contrast. COMPARISON:  CT of the abdomen and pelvis October 27, 2018. PET-CT January 18, 2014. FINDINGS: CT CHEST FINDINGS Cardiovascular: The heart size is normal. Coronary artery calcifications are seen in the left coronary arteries. Atherosclerotic changes are seen in the nonaneurysmal thoracic aorta. The pulmonary arteries are normal in caliber centrally. Mediastinum/Nodes: The thyroid and esophagus are unremarkable. No definite adenopathy. The left Port-A-Cath terminates in the central SVC. Lungs/Pleura: The trachea is unremarkable. Distortion of the central airways from previous radiation again identified. Mild bronchiectasis in the lingula is identified. A nodule in the lingula on series 4, image 84 measuring 7 mm is likely mucus plugging. Similar findings were seen in the lingula on the PET-CT from 2015. Paramediastinal soft tissue is consistent with postradiation change which is similar since 2015. Rounded ground-glass is seen in the right base on series 4, image 99, new since the CT scan from October 27, 2018. Mild ground-glass in the left base on image 99 is also new. Mild more focal opacity on the left such as on image 72 is likely infectious. No other suspicious infiltrates. There is a right-sided pleural effusion which is similar to smaller in the base compared October 27, 2018. There is a component towards the apex which may be loculated,  larger since 2015 and not imaged on the April 2020 study. No definite left-sided pleural effusion. Musculoskeletal: See below CT ABDOMEN PELVIS FINDINGS Hepatobiliary: High attenuation in the gallbladder could represent sludge or stones. No wall thickening identified. No liver masses are noted. Pancreas: Unremarkable. No pancreatic ductal dilatation or surrounding inflammatory changes. Spleen: Normal in size without focal abnormality. Adrenals/Urinary Tract: Adrenal glands are unremarkable. Kidneys are normal, without renal calculi, focal lesion, or hydronephrosis. Bladder is unremarkable.  Stomach/Bowel: Other than a small hiatal hernia, the stomach is normal. There is fecal material in the distal ileum. Otherwise, the small bowel is unremarkable. Extensive colonic diverticuli are identified without evidence of diverticulitis. There is moderate fecal loading throughout the colon. The appendix is normal. Vascular/Lymphatic: Atherosclerotic changes are seen in the nonaneurysmal aorta. No adenopathy. Reproductive: Uterus and bilateral adnexa are unremarkable. Other: No abdominal wall hernia or abnormality. No abdominopelvic ascites. Musculoskeletal: No acute or significant osseous findings. IMPRESSION: 1. The right-sided pleural effusion is smaller at the base compared 2 October 27, 2018. There is also a component of pleural fluid at the right apex which is larger compared to 2015 but was not imaged on the April 2020 study. The apically component is likely loculated. 2. Rounded ground-glass opacity the right base and mild patchy ground-glass opacity in left base, not present October 27, 2018, are favored to represent an infectious process. The ground-glass nature raises the possibility of atypical infections. There is also more focal patchy opacity in the left base which is likely infectious as well. Recommend treatment with short-term follow-up to ensure resolution of these findings. 3. Post radiation changes and scarring  in the chest. 4. High attenuation in the gallbladder could represent sludge or stones. There is no wall thickening or pericholecystic fluid identified. 5. Atherosclerosis in the thoracic and abdominal aorta. Coronary artery calcifications. 6. Fecal loading throughout the colon. 7. Fecal material in the distal ileum likely sequela of slow transit of uncertain etiology. 8. Extensive colonic diverticulosis seen without diverticulitis. Electronically Signed   By: Dorise Bullion III M.D   On: 11/15/2018 10:59   Dg Lumbar Spine 2-3 Views  Result Date: 11/15/2018 CLINICAL DATA:  72 year old admitted for acute kidney injury and possible atypical pneumonia, with chronic low back pain. EXAM: LUMBAR SPINE - 2-3 VIEW COMPARISON:  Bone window images from CT chest abdomen and pelvis performed earlier same day. FINDINGS: Five non-rib-bearing lumbar vertebrae with anatomic alignment. No fractures. Mild disc space narrowing at L3-4. Remaining disc spaces well-preserved. Facet degenerative changes at L3-4, L4-5 and L5-S1. Sacroiliac joints anatomically aligned without significant degenerative change. Aortoiliac atherosclerosis. IMPRESSION: 1. No acute osseous abnormality. 2. Mild degenerative disc disease at L3-4. 3. Facet degenerative changes at L3-4, L4-5 and L5-S1. Electronically Signed   By: Evangeline Dakin M.D.   On: 11/15/2018 18:48   Ct Chest Wo Contrast  Result Date: 11/15/2018 CLINICAL DATA:  Bilateral lower back pain with nausea. No dysuria or frank hematuria. Fever. Chest pain or shortness of breath. Evaluate for chronic loculated effusion. Recently in hospital with sepsis. EXAM: CT CHEST, ABDOMEN AND PELVIS WITHOUT CONTRAST TECHNIQUE: Multidetector CT imaging of the chest, abdomen and pelvis was performed following the standard protocol without IV contrast. COMPARISON:  CT of the abdomen and pelvis October 27, 2018. PET-CT January 18, 2014. FINDINGS: CT CHEST FINDINGS Cardiovascular: The heart size is normal. Coronary  artery calcifications are seen in the left coronary arteries. Atherosclerotic changes are seen in the nonaneurysmal thoracic aorta. The pulmonary arteries are normal in caliber centrally. Mediastinum/Nodes: The thyroid and esophagus are unremarkable. No definite adenopathy. The left Port-A-Cath terminates in the central SVC. Lungs/Pleura: The trachea is unremarkable. Distortion of the central airways from previous radiation again identified. Mild bronchiectasis in the lingula is identified. A nodule in the lingula on series 4, image 84 measuring 7 mm is likely mucus plugging. Similar findings were seen in the lingula on the PET-CT from 2015. Paramediastinal soft tissue is consistent with postradiation change  which is similar since 2015. Rounded ground-glass is seen in the right base on series 4, image 99, new since the CT scan from October 27, 2018. Mild ground-glass in the left base on image 99 is also new. Mild more focal opacity on the left such as on image 72 is likely infectious. No other suspicious infiltrates. There is a right-sided pleural effusion which is similar to smaller in the base compared October 27, 2018. There is a component towards the apex which may be loculated, larger since 2015 and not imaged on the April 2020 study. No definite left-sided pleural effusion. Musculoskeletal: See below CT ABDOMEN PELVIS FINDINGS Hepatobiliary: High attenuation in the gallbladder could represent sludge or stones. No wall thickening identified. No liver masses are noted. Pancreas: Unremarkable. No pancreatic ductal dilatation or surrounding inflammatory changes. Spleen: Normal in size without focal abnormality. Adrenals/Urinary Tract: Adrenal glands are unremarkable. Kidneys are normal, without renal calculi, focal lesion, or hydronephrosis. Bladder is unremarkable. Stomach/Bowel: Other than a small hiatal hernia, the stomach is normal. There is fecal material in the distal ileum. Otherwise, the small bowel is  unremarkable. Extensive colonic diverticuli are identified without evidence of diverticulitis. There is moderate fecal loading throughout the colon. The appendix is normal. Vascular/Lymphatic: Atherosclerotic changes are seen in the nonaneurysmal aorta. No adenopathy. Reproductive: Uterus and bilateral adnexa are unremarkable. Other: No abdominal wall hernia or abnormality. No abdominopelvic ascites. Musculoskeletal: No acute or significant osseous findings. IMPRESSION: 1. The right-sided pleural effusion is smaller at the base compared 2 October 27, 2018. There is also a component of pleural fluid at the right apex which is larger compared to 2015 but was not imaged on the April 2020 study. The apically component is likely loculated. 2. Rounded ground-glass opacity the right base and mild patchy ground-glass opacity in left base, not present October 27, 2018, are favored to represent an infectious process. The ground-glass nature raises the possibility of atypical infections. There is also more focal patchy opacity in the left base which is likely infectious as well. Recommend treatment with short-term follow-up to ensure resolution of these findings. 3. Post radiation changes and scarring in the chest. 4. High attenuation in the gallbladder could represent sludge or stones. There is no wall thickening or pericholecystic fluid identified. 5. Atherosclerosis in the thoracic and abdominal aorta. Coronary artery calcifications. 6. Fecal loading throughout the colon. 7. Fecal material in the distal ileum likely sequela of slow transit of uncertain etiology. 8. Extensive colonic diverticulosis seen without diverticulitis. Electronically Signed   By: Dorise Bullion III M.D   On: 11/15/2018 10:59   Ct Abdomen Pelvis W Contrast  Result Date: 10/27/2018 CLINICAL DATA:  Right-sided abdominal and flank pain. Acute abdominal pain. EXAM: CT ABDOMEN AND PELVIS WITH CONTRAST TECHNIQUE: Multidetector CT imaging of the abdomen and  pelvis was performed using the standard protocol following bolus administration of intravenous contrast. CONTRAST:  46mL OMNIPAQUE IOHEXOL 300 MG/ML  SOLN COMPARISON:  Abdominal CT 12/19/2017. Chest radiograph earlier this day as well as 03/01/2018 FINDINGS: Motion artifact limits assessment of the chest, abdomen, and pelvis. Lower chest: Small layering left pleural effusion. Moderate right pleural effusion is partially loculated anteriorly. Vague geographic ground-glass opacities in the left lung base which are partially obscured by motion. Hepatobiliary: Slight heterogeneous enhancement. Small cyst in the right lobe is unchanged from prior exam. Gallbladder physiologically distended, no calcified stone. No biliary dilatation. Pancreas: Motion artifact. No ductal dilatation or inflammation. There is a 5 mm cyst in the proximal  pancreatic body, image 32 series 2. Spleen: Normal in size without gross abnormality, motion obscured. Adrenals/Urinary Tract: Normal adrenal glands. Right kidney is slightly low in positioning. No hydronephrosis or perinephric edema. Slight delayed excretion on delayed phase imaging. Motion artifact limits detailed assessment. Urinary bladder is physiologically distended. No bladder wall thickening. Stomach/Bowel: Small hiatal hernia. Stomach grossly unremarkable. No bowel obstruction. Enteric contrast reaches the colon. Diverticulosis from the mid transverse colon distally. Diverticular prominent in the sigmoid colon. No evidence of diverticulitis. Air in stool distends the rectum. No rectal wall thickening. No obvious bowel inflammation. Appendix not confidently visualized, no evidence of appendicitis. Vascular/Lymphatic: Advanced aortic atherosclerosis. IVC appears prominent. Moderate branch atherosclerosis. No acute vascular finding. Portal vein appears patent. No definite enlarged lymph nodes in the abdomen or pelvis. Reproductive: Uterus and bilateral adnexa are unremarkable. Ovaries  are quiescent, normal for age. Other: No ascites or free air. No evidence of intra-abdominal abscess. Musculoskeletal: There are no acute or suspicious osseous abnormalities. Degenerative change in the lower lumbar spine. IMPRESSION: 1. Slight heterogeneity of the liver is nonspecific, but may be due to elevated right heart pressures given prominent IVC. No other acute finding in the abdomen or pelvis. Motion artifact slightly limits detailed assessment. 2. Bilateral pleural effusions, small on the left and moderate on the right and partially loculated. Chest x-ray earlier today demonstrated similar radiographic appearance of the chest dating back to 03/01/2018, and findings may be chronic. 3. Colonic diverticulosis without diverticulitis. 4. Incidental 5 mm cyst in the proximal pancreatic body. Recommend follow up pre and post contrast MRI/MRCP or pancreatic protocol CT in 2 years. This recommendation follows ACR consensus guidelines: Management of Incidental Pancreatic Cysts: A White Paper of the ACR Incidental Findings Committee. J Am Coll Radiol 7017;79:390-300. 5.  Advanced Aortic atherosclerosis (ICD10-I70.0). Electronically Signed   By: Keith Rake M.D.   On: 10/27/2018 21:37   US Renal  Result Date: 11/16/2018 CLINICAL DATA:  Acute kidney injury. EXAM: RENAL / URINARY TRACT ULTRASOUND COMPLETE COMPARISON:  CT scan of November 15, 2018. FINDINGS: Right Kidney: Renal measurements: 9.2 x 4.4 x 4.2 cm = volume: 88 mL. Mildly increased echogenicity of renal parenchyma is noted. No mass or hydronephrosis visualized. Left Kidney: Renal measurements: 9.9 x 5.4 x 4.6 cm = volume: 130 mL. Mildly increased echogenicity of renal parenchyma is noted. No mass or hydronephrosis visualized. Bladder: Appears normal for degree of bladder distention. IMPRESSION: Mildly increased echogenicity of renal parenchyma is noted bilaterally suggesting medical renal disease. No hydronephrosis or renal obstruction is noted.  Electronically Signed   By: Marijo Conception M.D.   On: 11/16/2018 09:27   Dg Chest Portable 1 View  Result Date: 11/15/2018 CLINICAL DATA:  73 year old female with history of non-Hodgkin's lymphoma diagnosed in 2014. Shortness of breath. EXAM: PORTABLE CHEST 1 VIEW COMPARISON:  Chest x-ray 10/30/2018. FINDINGS: Left subclavian single-lumen porta cath with tip terminating in the distal superior vena cava. Extensive pleuroparenchymal architectural distortion and volume loss is again noted in the lungs bilaterally, most evident in the apex of the right hemithorax and in the base of the right hemithorax, similar to numerous prior examinations, most compatible with chronic scarring. No definite acute consolidative airspace disease. Chronic moderate right pleural effusion which appears partially loculated, stable compared to prior examinations. No left pleural effusion. No evidence of pulmonary edema. Heart size is normal. Aortic atherosclerosis. IMPRESSION: 1. Extensive scarring in the thorax redemonstrated with chronic moderate right partially loculated pleural effusion, similar to prior examinations,  as above. No definite radiographic evidence of acute cardiopulmonary disease. Electronically Signed   By: Vinnie Langton M.D.   On: 11/15/2018 08:10   Dg Chest Port 1 View  Result Date: 10/30/2018 CLINICAL DATA:  Acute respiratory failure. EXAM: PORTABLE CHEST 1 VIEW COMPARISON:  10/29/2018 FINDINGS: Power port inserted from the left is unchanged with the tip in the SVC above the right atrium. Bilateral pulmonary and pleural scarring persists, with the possibility of some coexistent acute patchy infiltrates. Pattern appears quite similar to multiple previous films. No worsening or new finding. IMPRESSION: No worsening or new finding. Extensive bilateral pleural and parenchymal scarring. Possible associated acute pulmonary infiltrate, particularly suspicious in the left upper lobe. Electronically Signed   By: Nelson Chimes M.D.   On: 10/30/2018 05:33   Dg Chest Port 1 View  Result Date: 10/29/2018 CLINICAL DATA:  Respiratory distress EXAM: PORTABLE CHEST 1 VIEW COMPARISON:  10/27/2018 FINDINGS: Left chest wall Port-A-Cath tip projects over the lower SVC, unchanged. Unchanged size of right pleural effusion. Increased left basilar atelectasis. Upper right chest opacities are unchanged. IMPRESSION: Slightly increased left basilar atelectasis but otherwise unchanged appearance of chronic lung disease. Electronically Signed   By: Ulyses Jarred M.D.   On: 10/29/2018 20:57   Dg Chest Port 1 View  Result Date: 10/27/2018 CLINICAL DATA:  Fever and cough EXAM: PORTABLE CHEST 1 VIEW COMPARISON:  03/01/2018 FINDINGS: Extensive chronic lung disease apical scarring bilaterally. Extensive on the right with retraction of the right hilum. Left hilar retraction also unchanged due to apical scarring. Extensive pleuroparenchymal scarring in the right lung base unchanged. Diffuse parenchymal airspace density appears chronic and unchanged. Port-A-Cath tip in the SVC. IMPRESSION: Extensive chronic lung disease bilaterally right greater than left, stable from prior studies. No superimposed acute abnormality. Electronically Signed   By: Franchot Gallo M.D.   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   This document was prepared  using Dragon voice recognition software and may include unintentional dictation errors.     Ottie Glazier, M.D.  Division of [facility]

## 2018-11-17 ENCOUNTER — Inpatient Hospital Stay: Payer: Medicare Other

## 2018-11-17 LAB — BASIC METABOLIC PANEL
Anion gap: 11 (ref 5–15)
BUN: 28 mg/dL — ABNORMAL HIGH (ref 8–23)
CO2: 24 mmol/L (ref 22–32)
Calcium: 9.3 mg/dL (ref 8.9–10.3)
Chloride: 106 mmol/L (ref 98–111)
Creatinine, Ser: 1.27 mg/dL — ABNORMAL HIGH (ref 0.44–1.00)
GFR calc Af Amer: 48 mL/min — ABNORMAL LOW (ref >60)
GFR calc non Af Amer: 41 mL/min — ABNORMAL LOW (ref >60)
Glucose, Bld: 118 mg/dL — ABNORMAL HIGH (ref 70–99)
Potassium: 3.9 mmol/L (ref 3.5–5.1)
Sodium: 141 mmol/L (ref 135–145)

## 2018-11-17 MED ORDER — PREDNISONE 10 MG PO TABS
10.0000 mg | ORAL_TABLET | Freq: Every day | ORAL | Status: DC
  Administered 2018-11-17 – 2018-11-18 (×2): 10 mg via ORAL
  Filled 2018-11-17 (×2): qty 1

## 2018-11-17 MED ORDER — ENOXAPARIN SODIUM 40 MG/0.4ML ~~LOC~~ SOLN
40.0000 mg | SUBCUTANEOUS | Status: DC
  Administered 2018-11-17: 40 mg via SUBCUTANEOUS
  Filled 2018-11-17: qty 0.4

## 2018-11-17 MED ORDER — CEFDINIR 300 MG PO CAPS: 300.0000 mg | ORAL_CAPSULE | Freq: Two times a day (BID) | ORAL | 0 refills | Status: DC

## 2018-11-17 MED ORDER — FUROSEMIDE 10 MG/ML IJ SOLN
20.0000 mg | Freq: Once | INTRAMUSCULAR | Status: AC
  Administered 2018-11-17: 20 mg via INTRAVENOUS
  Filled 2018-11-17: qty 4

## 2018-11-17 MED ORDER — LEVOFLOXACIN IN D5W 750 MG/150ML IV SOLN
750.0000 mg | INTRAVENOUS | Status: DC
  Administered 2018-11-17: 17:00:00 750 mg via INTRAVENOUS
  Filled 2018-11-17: qty 150

## 2018-11-17 MED ORDER — ALBUMIN HUMAN 25 % IV SOLN
12.5000 g | Freq: Once | INTRAVENOUS | Status: AC
  Administered 2018-11-17: 10:00:00 12.5 g via INTRAVENOUS
  Filled 2018-11-17: qty 50

## 2018-11-17 NOTE — Progress Notes (Signed)
RN notified pulmonologist pt BP drop after giving lasix to 88/56 and resp are now 26. Pt stated she feels okay. Per MD keep closely monitoring pt for now.

## 2018-11-17 NOTE — Progress Notes (Signed)
Respiratory therapy notified writer patient's respirations were at 30/minute. Respiratory therapy notified MD Pulmonologist. Resp increased to 4 liters [place] at 96%. Writer notified MD, MD gave orders to decreased oxygen to 3 liters. Orders followed. Patient vital signs- BP 103/67, P 107, R 30, 96% 3 liters. Patient denies c/o pain. Will continue to monitor.

## 2018-11-17 NOTE — Progress Notes (Signed)
Pulmonary Medicine          Date: 11/17/2018,   MRN# [id number] Anna Mcbride 1946-03-01     AdmissionWeight: 53 kg                 CurrentWeight: 53 kg      CHIEF COMPLAINT:   Worsening dyspnea and febrile episode   SUBJECTIVE    Patient reports worsening dyspnea overnight.  She is tachypneic during eval this am.  Fragmented speech "I just cant breathe".    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   NASAL SINUS SURGERY  08/07/05 09/09/05   x 2    VIDEO BRONCHOSCOPY Bilateral 11/30/2014   Procedure: VIDEO BRONCHOSCOPY WITHOUT FLUORO;  Surgeon: Flora Lipps, MD;  Location: [facility];  Service: Cardiopulmonary;  Laterality: Bilateral;     FAMILY HISTORY   Family History  Problem Relation Age of Onset   Asthma Son    Congestive Heart Failure Mother    Diabetes Brother    Pulmonary embolism Father    Breast cancer Neg Hx      SOCIAL HISTORY   Social History   Tobacco Use   Smoking status: Never Smoker   Smokeless tobacco: Never Used  Substance Use Topics   Alcohol use: Not Currently    Frequency: Never    Comment: a couple glasses of wine a year   Drug use: No     MEDICATIONS    Home Medication:    Current Medication:  Current Facility-Administered Medications:    0.9 %  sodium chloride infusion, , Intravenous, Continuous, Epifanio Lesches, MD, Last Rate: 75  mL/hr at 11/16/18 1518   acetaminophen (TYLENOL) tablet 650 mg, 650 mg, Oral, Q6H PRN **OR** acetaminophen (TYLENOL) suppository 650 mg, 650 mg, Rectal, Q6H PRN, Mayo, Pete Pelt, MD   acetylcysteine (MUCOMYST) 20 % nebulizer / oral solution 4 mL, 4 mL, Nebulization, Q6H, Mayo, Pete Pelt, MD, 4 mL at 11/17/18 0105   acidophilus (RISAQUAD) capsule 1 capsule, 1 capsule, Oral, TID WC, Cyndee Brightly M, RPH, 1 capsule at 11/16/18 1720   albuterol (VENTOLIN HFA) 108 (90 Base) MCG/ACT inhaler 1 puff, 1 puff, Inhalation, Q6H PRN, Mayo, Pete Pelt, MD   ALPRAZolam Duanne Moron) tablet 0.5 mg, 0.5 mg, Oral, TID PRN, Mayo, Pete Pelt, MD, 0.5 mg at 11/17/18 0118   amiodarone (PACERONE) tablet 200 mg, 200 mg, Oral, Daily, Mayo, Pete Pelt, MD, 200 mg at 11/16/18 8413   bisacodyl (DULCOLAX) suppository 10 mg, 10 mg, Rectal, Daily PRN, Mayo, Pete Pelt, MD, 10 mg at 11/16/18 1503   enoxaparin (LOVENOX) injection 30 mg, 30 mg, Subcutaneous, Q24H, Mayo, Pete Pelt, MD, 30 mg at 11/16/18 1720   feeding supplement (ENSURE ENLIVE) (ENSURE ENLIVE) liquid 237 mL, 237 mL, Oral, TID BM, Mayo, Pete Pelt, MD, 237 mL at 11/16/18 2040   ipratropium-albuterol (DUONEB) 0.5-2.5 (3) MG/3ML nebulizer solution 3 mL, 3 mL, Nebulization, Q6H, Mayo,  Pete Pelt, MD, 3 mL at 11/17/18 0105   levofloxacin (LEVAQUIN) IVPB 500 mg, 500 mg, Intravenous, Q48H, Epifanio Lesches, MD   levothyroxine (SYNTHROID) tablet 25 mcg, 25 mcg, Oral, Q0600, Mayo, Pete Pelt, MD, 25 mcg at 11/17/18 0610   multivitamin with minerals tablet 1 tablet, 1 tablet, Oral, Daily, Mayo, Pete Pelt, MD, 1 tablet at 11/16/18 0927   ondansetron (ZOFRAN) tablet 4 mg, 4 mg, Oral, Q6H PRN **OR** ondansetron (ZOFRAN) injection 4 mg, 4 mg, Intravenous, Q6H PRN, Mayo, Pete Pelt, MD   pantoprazole (PROTONIX) EC tablet 40 mg, 40 mg, Oral, Daily, Mayo, Pete Pelt, MD, 40 mg at 11/16/18 0927   polyethylene glycol (MIRALAX / GLYCOLAX) packet 17 g, 17 g, Oral, Daily, Mayo, Pete Pelt, MD, 17 g at 11/16/18 0109   predniSONE (DELTASONE) tablet 25 mg, 25 mg, Oral, Daily, Mayo, Pete Pelt, MD, 25 mg at 11/16/18 3235   senna-docusate (Senokot-S) tablet 2 tablet, 2 tablet, Oral, BID, Mayo, Pete Pelt, MD, 2 tablet at 11/16/18 2053  Facility-Administered Medications Ordered in Other Encounters:    heparin lock flush 100 unit/mL, 500 Units, Intravenous, Once, Brahmanday, Lenetta Quaker R, MD   sodium chloride flush (NS) 0.9 % injection 10 mL, 10 mL, Intravenous, Once, Cammie Sickle, MD    ALLERGIES   Hydrocodone-homatropine; Tussionex pennkinetic er [employer] polst-cpm polst er]; and Ciprofloxacin     REVIEW OF SYSTEMS    Review of Systems:  Gen:  Denies  fever, sweats, chills weigh loss  HEENT: Denies blurred vision, double vision, ear pain, eye pain, hearing loss, nose bleeds, sore throat Cardiac:  No dizziness, chest pain or heaviness, chest tightness,edema Resp:   Denies cough or sputum porduction, shortness of breath,wheezing, hemoptysis,  Gi: Denies swallowing difficulty, stomach pain, nausea or vomiting, diarrhea, constipation, bowel incontinence Gu:  Denies bladder incontinence, burning urine Ext:   Denies Joint pain, stiffness or swelling Skin: Denies  skin rash, easy bruising or bleeding or hives Endoc:  Denies polyuria, polydipsia , polyphagia or weight change Psych:   Denies depression, insomnia or hallucinations   Other:  All other systems negative   VS: BP 108/71 (BP Location: Right Arm)    Pulse (!) 102    Temp 98 F (36.7 C) (Oral)    Resp 20    Ht 5\' 3"  (1.6 m)    Wt 53 kg    LMP  (LMP Unknown)    SpO2 100%    BMI 20.70 kg/m      PHYSICAL EXAM    GENERAL:NAD, no fevers, chills, no weakness no fatigue HEAD: Normocephalic, atraumatic.  EYES: Pupils equal, round, reactive to light. Extraocular muscles intact. No scleral icterus.  MOUTH: Moist mucosal membrane. Dentition intact. No abscess noted.  EAR, NOSE, THROAT: Clear without  exudates. No external lesions.  NECK: Supple. No thyromegaly. No nodules. No JVD.  PULMONARY: Rhonchi + worse on right side  CARDIOVASCULAR: S1 and S2. Regular rate and rhythm. No murmurs, rubs, or gallops. No edema. Pedal pulses 2+ bilaterally.  GASTROINTESTINAL: Soft, nontender, nondistended. No masses. Positive bowel sounds. No hepatosplenomegaly.  MUSCULOSKELETAL: No swelling, clubbing, or edema. Range of motion full in all extremities.  NEUROLOGIC: Cranial nerves II through XII are intact. No gross focal neurological deficits. Sensation intact. Reflexes intact.  SKIN: No ulceration, lesions, rashes, or cyanosis. Skin warm and dry. Turgor intact.  PSYCHIATRIC: Mood, affect within normal limits. The patient is awake, alert and oriented x 3. Insight, judgment intact.       IMAGING  Ct Abdomen Pelvis Wo Contrast  Result Date: 11/15/2018 CLINICAL DATA:  Bilateral lower back pain with nausea. No dysuria or frank hematuria. Fever. Chest pain or shortness of breath. Evaluate for chronic loculated effusion. Recently in hospital with sepsis. EXAM: CT CHEST, ABDOMEN AND PELVIS WITHOUT CONTRAST TECHNIQUE: Multidetector CT imaging of the chest, abdomen and pelvis was performed following the standard protocol without IV contrast. COMPARISON:  CT of the abdomen and pelvis October 27, 2018. PET-CT January 18, 2014. FINDINGS: CT CHEST FINDINGS Cardiovascular: The heart size is normal. Coronary artery calcifications are seen in the left coronary arteries. Atherosclerotic changes are seen in the nonaneurysmal thoracic aorta. The pulmonary arteries are normal in caliber centrally. Mediastinum/Nodes: The thyroid and esophagus are unremarkable. No definite adenopathy. The left Port-A-Cath terminates in the central SVC. Lungs/Pleura: The trachea is unremarkable. Distortion of the central airways from previous radiation again identified. Mild bronchiectasis in the lingula is identified. A nodule in the lingula on series  4, image 84 measuring 7 mm is likely mucus plugging. Similar findings were seen in the lingula on the PET-CT from 2015. Paramediastinal soft tissue is consistent with postradiation change which is similar since 2015. Rounded ground-glass is seen in the right base on series 4, image 99, new since the CT scan from October 27, 2018. Mild ground-glass in the left base on image 99 is also new. Mild more focal opacity on the left such as on image 72 is likely infectious. No other suspicious infiltrates. There is a right-sided pleural effusion which is similar to smaller in the base compared October 27, 2018. There is a component towards the apex which may be loculated, larger since 2015 and not imaged on the April 2020 study. No definite left-sided pleural effusion. Musculoskeletal: See below CT ABDOMEN PELVIS FINDINGS Hepatobiliary: High attenuation in the gallbladder could represent sludge or stones. No wall thickening identified. No liver masses are noted. Pancreas: Unremarkable. No pancreatic ductal dilatation or surrounding inflammatory changes. Spleen: Normal in size without focal abnormality. Adrenals/Urinary Tract: Adrenal glands are unremarkable. Kidneys are normal, without renal calculi, focal lesion, or hydronephrosis. Bladder is unremarkable. Stomach/Bowel: Other than a small hiatal hernia, the stomach is normal. There is fecal material in the distal ileum. Otherwise, the small bowel is unremarkable. Extensive colonic diverticuli are identified without evidence of diverticulitis. There is moderate fecal loading throughout the colon. The appendix is normal. Vascular/Lymphatic: Atherosclerotic changes are seen in the nonaneurysmal aorta. No adenopathy. Reproductive: Uterus and bilateral adnexa are unremarkable. Other: No abdominal wall hernia or abnormality. No abdominopelvic ascites. Musculoskeletal: No acute or significant osseous findings. IMPRESSION: 1. The right-sided pleural effusion is smaller at the base  compared 2 October 27, 2018. There is also a component of pleural fluid at the right apex which is larger compared to 2015 but was not imaged on the April 2020 study. The apically component is likely loculated. 2. Rounded ground-glass opacity the right base and mild patchy ground-glass opacity in left base, not present October 27, 2018, are favored to represent an infectious process. The ground-glass nature raises the possibility of atypical infections. There is also more focal patchy opacity in the left base which is likely infectious as well. Recommend treatment with short-term follow-up to ensure resolution of these findings. 3. Post radiation changes and scarring in the chest. 4. High attenuation in the gallbladder could represent sludge or stones. There is no wall thickening or pericholecystic fluid identified. 5. Atherosclerosis in the thoracic and abdominal aorta. Coronary artery calcifications. 6. Fecal  loading throughout the colon. 7. Fecal material in the distal ileum likely sequela of slow transit of uncertain etiology. 8. Extensive colonic diverticulosis seen without diverticulitis. Electronically Signed   By: Dorise Bullion III M.D   On: 11/15/2018 10:59   Dg Lumbar Spine 2-3 Views  Result Date: 11/15/2018 CLINICAL DATA:  73 year old admitted for acute kidney injury and possible atypical pneumonia, with chronic low back pain. EXAM: LUMBAR SPINE - 2-3 VIEW COMPARISON:  Bone window images from CT chest abdomen and pelvis performed earlier same day. FINDINGS: Five non-rib-bearing lumbar vertebrae with anatomic alignment. No fractures. Mild disc space narrowing at L3-4. Remaining disc spaces well-preserved. Facet degenerative changes at L3-4, L4-5 and L5-S1. Sacroiliac joints anatomically aligned without significant degenerative change. Aortoiliac atherosclerosis. IMPRESSION: 1. No acute osseous abnormality. 2. Mild degenerative disc disease at L3-4. 3. Facet degenerative changes at L3-4, L4-5 and L5-S1.  Electronically Signed   By: Evangeline Dakin M.D.   On: 11/15/2018 18:48   Ct Chest Wo Contrast  Result Date: 11/15/2018 CLINICAL DATA:  Bilateral lower back pain with nausea. No dysuria or frank hematuria. Fever. Chest pain or shortness of breath. Evaluate for chronic loculated effusion. Recently in hospital with sepsis. EXAM: CT CHEST, ABDOMEN AND PELVIS WITHOUT CONTRAST TECHNIQUE: Multidetector CT imaging of the chest, abdomen and pelvis was performed following the standard protocol without IV contrast. COMPARISON:  CT of the abdomen and pelvis October 27, 2018. PET-CT January 18, 2014. FINDINGS: CT CHEST FINDINGS Cardiovascular: The heart size is normal. Coronary artery calcifications are seen in the left coronary arteries. Atherosclerotic changes are seen in the nonaneurysmal thoracic aorta. The pulmonary arteries are normal in caliber centrally. Mediastinum/Nodes: The thyroid and esophagus are unremarkable. No definite adenopathy. The left Port-A-Cath terminates in the central SVC. Lungs/Pleura: The trachea is unremarkable. Distortion of the central airways from previous radiation again identified. Mild bronchiectasis in the lingula is identified. A nodule in the lingula on series 4, image 84 measuring 7 mm is likely mucus plugging. Similar findings were seen in the lingula on the PET-CT from 2015. Paramediastinal soft tissue is consistent with postradiation change which is similar since 2015. Rounded ground-glass is seen in the right base on series 4, image 99, new since the CT scan from October 27, 2018. Mild ground-glass in the left base on image 99 is also new. Mild more focal opacity on the left such as on image 72 is likely infectious. No other suspicious infiltrates. There is a right-sided pleural effusion which is similar to smaller in the base compared October 27, 2018. There is a component towards the apex which may be loculated, larger since 2015 and not imaged on the April 2020 study. No definite  left-sided pleural effusion. Musculoskeletal: See below CT ABDOMEN PELVIS FINDINGS Hepatobiliary: High attenuation in the gallbladder could represent sludge or stones. No wall thickening identified. No liver masses are noted. Pancreas: Unremarkable. No pancreatic ductal dilatation or surrounding inflammatory changes. Spleen: Normal in size without focal abnormality. Adrenals/Urinary Tract: Adrenal glands are unremarkable. Kidneys are normal, without renal calculi, focal lesion, or hydronephrosis. Bladder is unremarkable. Stomach/Bowel: Other than a small hiatal hernia, the stomach is normal. There is fecal material in the distal ileum. Otherwise, the small bowel is unremarkable. Extensive colonic diverticuli are identified without evidence of diverticulitis. There is moderate fecal loading throughout the colon. The appendix is normal. Vascular/Lymphatic: Atherosclerotic changes are seen in the nonaneurysmal aorta. No adenopathy. Reproductive: Uterus and bilateral adnexa are unremarkable. Other: No abdominal wall hernia or  abnormality. No abdominopelvic ascites. Musculoskeletal: No acute or significant osseous findings. IMPRESSION: 1. The right-sided pleural effusion is smaller at the base compared 2 October 27, 2018. There is also a component of pleural fluid at the right apex which is larger compared to 2015 but was not imaged on the April 2020 study. The apically component is likely loculated. 2. Rounded ground-glass opacity the right base and mild patchy ground-glass opacity in left base, not present October 27, 2018, are favored to represent an infectious process. The ground-glass nature raises the possibility of atypical infections. There is also more focal patchy opacity in the left base which is likely infectious as well. Recommend treatment with short-term follow-up to ensure resolution of these findings. 3. Post radiation changes and scarring in the chest. 4. High attenuation in the gallbladder could represent  sludge or stones. There is no wall thickening or pericholecystic fluid identified. 5. Atherosclerosis in the thoracic and abdominal aorta. Coronary artery calcifications. 6. Fecal loading throughout the colon. 7. Fecal material in the distal ileum likely sequela of slow transit of uncertain etiology. 8. Extensive colonic diverticulosis seen without diverticulitis. Electronically Signed   By: Dorise Bullion III M.D   On: 11/15/2018 10:59   Ct Abdomen Pelvis W Contrast  Result Date: 10/27/2018 CLINICAL DATA:  Right-sided abdominal and flank pain. Acute abdominal pain. EXAM: CT ABDOMEN AND PELVIS WITH CONTRAST TECHNIQUE: Multidetector CT imaging of the abdomen and pelvis was performed using the standard protocol following bolus administration of intravenous contrast. CONTRAST:  57mL OMNIPAQUE IOHEXOL 300 MG/ML  SOLN COMPARISON:  Abdominal CT 12/19/2017. Chest radiograph earlier this day as well as 03/01/2018 FINDINGS: Motion artifact limits assessment of the chest, abdomen, and pelvis. Lower chest: Small layering left pleural effusion. Moderate right pleural effusion is partially loculated anteriorly. Vague geographic ground-glass opacities in the left lung base which are partially obscured by motion. Hepatobiliary: Slight heterogeneous enhancement. Small cyst in the right lobe is unchanged from prior exam. Gallbladder physiologically distended, no calcified stone. No biliary dilatation. Pancreas: Motion artifact. No ductal dilatation or inflammation. There is a 5 mm cyst in the proximal pancreatic body, image 32 series 2. Spleen: Normal in size without gross abnormality, motion obscured. Adrenals/Urinary Tract: Normal adrenal glands. Right kidney is slightly low in positioning. No hydronephrosis or perinephric edema. Slight delayed excretion on delayed phase imaging. Motion artifact limits detailed assessment. Urinary bladder is physiologically distended. No bladder wall thickening. Stomach/Bowel: Small hiatal  hernia. Stomach grossly unremarkable. No bowel obstruction. Enteric contrast reaches the colon. Diverticulosis from the mid transverse colon distally. Diverticular prominent in the sigmoid colon. No evidence of diverticulitis. Air in stool distends the rectum. No rectal wall thickening. No obvious bowel inflammation. Appendix not confidently visualized, no evidence of appendicitis. Vascular/Lymphatic: Advanced aortic atherosclerosis. IVC appears prominent. Moderate branch atherosclerosis. No acute vascular finding. Portal vein appears patent. No definite enlarged lymph nodes in the abdomen or pelvis. Reproductive: Uterus and bilateral adnexa are unremarkable. Ovaries are quiescent, normal for age. Other: No ascites or free air. No evidence of intra-abdominal abscess. Musculoskeletal: There are no acute or suspicious osseous abnormalities. Degenerative change in the lower lumbar spine. IMPRESSION: 1. Slight heterogeneity of the liver is nonspecific, but may be due to elevated right heart pressures given prominent IVC. No other acute finding in the abdomen or pelvis. Motion artifact slightly limits detailed assessment. 2. Bilateral pleural effusions, small on the left and moderate on the right and partially loculated. Chest x-ray earlier today demonstrated similar radiographic appearance of  the chest dating back to 03/01/2018, and findings may be chronic. 3. Colonic diverticulosis without diverticulitis. 4. Incidental 5 mm cyst in the proximal pancreatic body. Recommend follow up pre and post contrast MRI/MRCP or pancreatic protocol CT in 2 years. This recommendation follows ACR consensus guidelines: Management of Incidental Pancreatic Cysts: A White Paper of the ACR Incidental Findings Committee. J Am Coll Radiol 1062;69:[id number]. 5.  Advanced Aortic atherosclerosis (ICD10-I70.0). Electronically Signed   By: Keith Rake M.D.   On: 10/27/2018 21:37   US Renal  Result Date: 11/16/2018 CLINICAL DATA:  Acute  kidney injury. EXAM: RENAL / URINARY TRACT ULTRASOUND COMPLETE COMPARISON:  CT scan of November 15, 2018. FINDINGS: Right Kidney: Renal measurements: 9.2 x 4.4 x 4.2 cm = volume: 88 mL. Mildly increased echogenicity of renal parenchyma is noted. No mass or hydronephrosis visualized. Left Kidney: Renal measurements: 9.9 x 5.4 x 4.6 cm = volume: 130 mL. Mildly increased echogenicity of renal parenchyma is noted. No mass or hydronephrosis visualized. Bladder: Appears normal for degree of bladder distention. IMPRESSION: Mildly increased echogenicity of renal parenchyma is noted bilaterally suggesting medical renal disease. No hydronephrosis or renal obstruction is noted. Electronically Signed   By: Marijo Conception M.D.   On: 11/16/2018 09:27   Dg Chest Portable 1 View  Result Date: 11/15/2018 CLINICAL DATA:  73 year old female with history of non-Hodgkin's lymphoma diagnosed in 2014. Shortness of breath. EXAM: PORTABLE CHEST 1 VIEW COMPARISON:  Chest x-ray 10/30/2018. FINDINGS: Left subclavian single-lumen porta cath with tip terminating in the distal superior vena cava. Extensive pleuroparenchymal architectural distortion and volume loss is again noted in the lungs bilaterally, most evident in the apex of the right hemithorax and in the base of the right hemithorax, similar to numerous prior examinations, most compatible with chronic scarring. No definite acute consolidative airspace disease. Chronic moderate right pleural effusion which appears partially loculated, stable compared to prior examinations. No left pleural effusion. No evidence of pulmonary edema. Heart size is normal. Aortic atherosclerosis. IMPRESSION: 1. Extensive scarring in the thorax redemonstrated with chronic moderate right partially loculated pleural effusion, similar to prior examinations, as above. No definite radiographic evidence of acute cardiopulmonary disease. Electronically Signed   By: Vinnie Langton M.D.   On: 11/15/2018 08:10   Dg  Chest Port 1 View  Result Date: 10/30/2018 CLINICAL DATA:  Acute respiratory failure. EXAM: PORTABLE CHEST 1 VIEW COMPARISON:  10/29/2018 FINDINGS: Power port inserted from the left is unchanged with the tip in the SVC above the right atrium. Bilateral pulmonary and pleural scarring persists, with the possibility of some coexistent acute patchy infiltrates. Pattern appears quite similar to multiple previous films. No worsening or new finding. IMPRESSION: No worsening or new finding. Extensive bilateral pleural and parenchymal scarring. Possible associated acute pulmonary infiltrate, particularly suspicious in the left upper lobe. Electronically Signed   By: Nelson Chimes M.D.   On: 10/30/2018 05:33   Dg Chest Port 1 View  Result Date: 10/29/2018 CLINICAL DATA:  Respiratory distress EXAM: PORTABLE CHEST 1 VIEW COMPARISON:  10/27/2018 FINDINGS: Left chest wall Port-A-Cath tip projects over the lower SVC, unchanged. Unchanged size of right pleural effusion. Increased left basilar atelectasis. Upper right chest opacities are unchanged. IMPRESSION: Slightly increased left basilar atelectasis but otherwise unchanged appearance of chronic lung disease. Electronically Signed   By: Ulyses Jarred M.D.   On: 10/29/2018 20:57   Dg Chest Port 1 View  Result Date: 10/27/2018 CLINICAL DATA:  Fever and cough EXAM: PORTABLE CHEST 1 VIEW  COMPARISON:  03/01/2018 FINDINGS: Extensive chronic lung disease apical scarring bilaterally. Extensive on the right with retraction of the right hilum. Left hilar retraction also unchanged due to apical scarring. Extensive pleuroparenchymal scarring in the right lung base unchanged. Diffuse parenchymal airspace density appears chronic and unchanged. Port-A-Cath tip in the SVC. IMPRESSION: Extensive chronic lung disease bilaterally right greater than left, stable from prior studies. No superimposed acute abnormality. Electronically Signed   By: Franchot Gallo M.D.   On: 10/27/2018 18:21       ASSESSMENT/PLAN    Acute respiratory distress with subjective fevers     Pt feels worse this am , will obtain cxr,tachypneic and SpO2 91% on 2Lnc while resting in bed    - likely due to atelectasis with possible community acquired pna     - will stop IVF as patient takes good PO.     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      Thank you for allowing me to participate in the care of this patient.    Patient/Family are satisfied with care plan and all questions have been answered.  This document was prepared using Dragon voice recognition software and may include unintentional dictation errors.     Ottie Glazier, M.D.  Division of [place]

## 2018-11-17 NOTE — Progress Notes (Signed)
Spoke with . patient's husband Mr. [name] informed that patient is not going home today having more respiratory distress and giving her diuretics and increase the amount of oxygen, he understands.

## 2018-11-17 NOTE — Progress Notes (Signed)
Pt found in resp distress with sats in mid 80's RR in high 30's-low40's pt on 2Lnc, increased O2 to 4L and called MD, no new orders. Pt resp status returned to normal. Attempted to decrease to 3L pt RR increased O2 returned to 4L, Pt returned to baseline resp status. MD & RN aware of distress. Will continue to monitor closely

## 2018-11-17 NOTE — Progress Notes (Signed)
Anticoagulation monitoring(Lovenox):  73yo  F ordered Lovenox 30 mg Q24h  Filed Weights   11/15/18 1655  Weight: 116 lb 13.5 oz (53 kg)   BMI 20.7   Lab Results  Component Value Date   CREATININE 1.27 (H) 11/17/2018   CREATININE 1.40 (H) 11/16/2018   CREATININE 1.72 (H) 11/15/2018   Estimated Creatinine Clearance: 31.7 mL/min (A) (by C-G formula based on SCr of 1.27 mg/dL (H)). Hemoglobin & Hematocrit     Component Value Date/Time   HGB 9.6 (L) 11/16/2018 0536   HGB 11.4 (L) 11/16/2014 1415   HCT 30.5 (L) 11/16/2018 0536   HCT 33.6 (L) 11/16/2014 1415     Per Protocol for Patient with estCrcl now > 30 ml/min and BMI < 40, will transition to Lovenox 40 mg Q24h      Chinita Greenland PharmD Clinical Pharmacist 11/17/2018

## 2018-11-17 NOTE — Progress Notes (Signed)
PT Cancellation Note  Patient Details Name: TAISHA PENNEBAKER MRN: [id number] DOB: 1945/10/30   Cancelled Treatment:    Reason Eval/Treat Not Completed: Medical issues which prohibited therapy Per chart/nursing: patient having increased respiratory distress with respiratory rate ~30. PT will follow acutely for medical appropriateness and intervene as tolerated. Chart Reviewed.  Myles Gip PT, DPT [phone] 11/17/2018, 10:05 AM

## 2018-11-17 NOTE — Progress Notes (Signed)
Patient has declined chest vest therapy for the evening

## 2018-11-17 NOTE — Progress Notes (Signed)
[facility] [place] NAME: Anna Mcbride    MR#:  [id number]  DATE OF BIRTH:  19-Jun-1946  SUBJECTIVE: Admitted for community-acquired pneumonia.  Patient having more respiratory distress today.  Saturation 91% on 2 L.  Chest x-ray showed slight worsening right lung opacification, spoke with pulmonary Dr. Tona Sensing, recommended to keep 1 more day., will discontinue IV fluids, start IV diuretics, oxygen is increased to 5 L.  Possible discharge home tomorrow.  CHIEF COMPLAINT:   Chief Complaint  Patient presents with  . Back Pain    REVIEW OF SYSTEMS:   ROS CONSTITUTIONAL: No fever, fatigue or weakness.  EYES: No blurred or double vision.  EARS, NOSE, AND THROAT: No tinnitus or ear pain.  RESPIRATORY: Respiratory distress is worse today.  CARDIOVASCULAR: No chest pain, orthopnea, edema.  GASTROINTESTINAL: No nausea, vomiting, diarrhea or abdominal pain.  GENITOURINARY: No dysuria, hematuria.  ENDOCRINE: No polyuria, nocturia,  HEMATOLOGY: No anemia, easy bruising or bleeding SKIN: No rash or lesion. MUSCULOSKELETAL: No joint pain or arthritis.   NEUROLOGIC: No tingling, numbness, weakness.  PSYCHIATRY: No anxiety or depression.   DRUG ALLERGIES:   Allergies  Allergen Reactions  . Hydrocodone-Homatropine Itching  . Tussionex Pennkinetic Er [Hydrocod Polst-Cpm Polst Er] Itching  . Ciprofloxacin Rash    VITALS:  Blood pressure 108/71, pulse (!) 102, temperature 98 F (36.7 C), temperature source Oral, resp. rate 20, height 5\' 3"  (1.6 m), weight 53 kg, SpO2 100 %.  PHYSICAL EXAMINATION:  GENERAL:  73 y.o.-year-old patient lying in the bed with no acute distress.  Chronically ill looking EYES: Pupils equal, round, reactive to light . No scleral icterus. Extraocular muscles intact.  HEENT: Head atraumatic, normocephalic. Oropharynx and nasopharynx clear.  NECK:  Supple, no jugular venous distention. No thyroid enlargement, no  tenderness.  LUNGS: coarse breath sounds bilaterally.  CARDIOVASCULAR: S1, S2 slightly tachycardic. No murmurs, rubs, or gallops.  ABDOMEN: Soft, nontender, nondistended. Bowel sounds present. No organomegaly or mass.  EXTREMITIES: No pedal edema, cyanosis, or clubbing.  NEUROLOGIC: Cranial nerves II through XII are intact. Muscle strength 5/5 in all extremities. Sensation intact. Gait not checked.  PSYCHIATRIC: The patient is alert and oriented x 3.  SKIN: No obvious rash, lesion, or ulcer.    LABORATORY PANEL:   CBC Recent Labs  Lab 11/16/18 0536  WBC 8.3  HGB 9.6*  HCT 30.5*  PLT 158   ------------------------------------------------------------------------------------------------------------------  Chemistries  Recent Labs  Lab 11/15/18 0826  11/17/18 0553  NA 137   < > 141  K 3.3*   < > 3.9  CL 100   < > 106  CO2 29   < > 24  GLUCOSE 104*   < > 118*  BUN 20   < > 28*  CREATININE 1.72*   < > 1.27*  CALCIUM 8.9   < > 9.3  AST 14*  --   --   ALT 13  --   --   ALKPHOS 60  --   --   BILITOT 0.9  --   --    < > = values in this interval not displayed.   ------------------------------------------------------------------------------------------------------------------  Cardiac Enzymes No results for input(s): TROPONINI in the last 168 hours. ------------------------------------------------------------------------------------------------------------------  RADIOLOGY:  Ct Abdomen Pelvis Wo Contrast  Result Date: 11/15/2018 CLINICAL DATA:  Bilateral lower back pain with nausea. No dysuria or frank hematuria. Fever. Chest pain or shortness of breath. Evaluate for chronic loculated effusion. Recently in hospital  with sepsis. EXAM: CT CHEST, ABDOMEN AND PELVIS WITHOUT CONTRAST TECHNIQUE: Multidetector CT imaging of the chest, abdomen and pelvis was performed following the standard protocol without IV contrast. COMPARISON:  CT of the abdomen and pelvis October 27, 2018. PET-CT  January 18, 2014. FINDINGS: CT CHEST FINDINGS Cardiovascular: The heart size is normal. Coronary artery calcifications are seen in the left coronary arteries. Atherosclerotic changes are seen in the nonaneurysmal thoracic aorta. The pulmonary arteries are normal in caliber centrally. Mediastinum/Nodes: The thyroid and esophagus are unremarkable. No definite adenopathy. The left Port-A-Cath terminates in the central SVC. Lungs/Pleura: The trachea is unremarkable. Distortion of the central airways from previous radiation again identified. Mild bronchiectasis in the lingula is identified. A nodule in the lingula on series 4, image 84 measuring 7 mm is likely mucus plugging. Similar findings were seen in the lingula on the PET-CT from 2015. Paramediastinal soft tissue is consistent with postradiation change which is similar since 2015. Rounded ground-glass is seen in the right base on series 4, image 99, new since the CT scan from October 27, 2018. Mild ground-glass in the left base on image 99 is also new. Mild more focal opacity on the left such as on image 72 is likely infectious. No other suspicious infiltrates. There is a right-sided pleural effusion which is similar to smaller in the base compared October 27, 2018. There is a component towards the apex which may be loculated, larger since 2015 and not imaged on the April 2020 study. No definite left-sided pleural effusion. Musculoskeletal: See below CT ABDOMEN PELVIS FINDINGS Hepatobiliary: High attenuation in the gallbladder could represent sludge or stones. No wall thickening identified. No liver masses are noted. Pancreas: Unremarkable. No pancreatic ductal dilatation or surrounding inflammatory changes. Spleen: Normal in size without focal abnormality. Adrenals/Urinary Tract: Adrenal glands are unremarkable. Kidneys are normal, without renal calculi, focal lesion, or hydronephrosis. Bladder is unremarkable. Stomach/Bowel: Other than a small hiatal hernia, the stomach  is normal. There is fecal material in the distal ileum. Otherwise, the small bowel is unremarkable. Extensive colonic diverticuli are identified without evidence of diverticulitis. There is moderate fecal loading throughout the colon. The appendix is normal. Vascular/Lymphatic: Atherosclerotic changes are seen in the nonaneurysmal aorta. No adenopathy. Reproductive: Uterus and bilateral adnexa are unremarkable. Other: No abdominal wall hernia or abnormality. No abdominopelvic ascites. Musculoskeletal: No acute or significant osseous findings. IMPRESSION: 1. The right-sided pleural effusion is smaller at the base compared 2 October 27, 2018. There is also a component of pleural fluid at the right apex which is larger compared to 2015 but was not imaged on the April 2020 study. The apically component is likely loculated. 2. Rounded ground-glass opacity the right base and mild patchy ground-glass opacity in left base, not present October 27, 2018, are favored to represent an infectious process. The ground-glass nature raises the possibility of atypical infections. There is also more focal patchy opacity in the left base which is likely infectious as well. Recommend treatment with short-term follow-up to ensure resolution of these findings. 3. Post radiation changes and scarring in the chest. 4. High attenuation in the gallbladder could represent sludge or stones. There is no wall thickening or pericholecystic fluid identified. 5. Atherosclerosis in the thoracic and abdominal aorta. Coronary artery calcifications. 6. Fecal loading throughout the colon. 7. Fecal material in the distal ileum likely sequela of slow transit of uncertain etiology. 8. Extensive colonic diverticulosis seen without diverticulitis. Electronically Signed   By: Dorise Bullion III M.D  On: 11/15/2018 10:59   Dg Lumbar Spine 2-3 Views  Result Date: 11/15/2018 CLINICAL DATA:  73 year old admitted for acute kidney injury and possible atypical  pneumonia, with chronic low back pain. EXAM: LUMBAR SPINE - 2-3 VIEW COMPARISON:  Bone window images from CT chest abdomen and pelvis performed earlier same day. FINDINGS: Five non-rib-bearing lumbar vertebrae with anatomic alignment. No fractures. Mild disc space narrowing at L3-4. Remaining disc spaces well-preserved. Facet degenerative changes at L3-4, L4-5 and L5-S1. Sacroiliac joints anatomically aligned without significant degenerative change. Aortoiliac atherosclerosis. IMPRESSION: 1. No acute osseous abnormality. 2. Mild degenerative disc disease at L3-4. 3. Facet degenerative changes at L3-4, L4-5 and L5-S1. Electronically Signed   By: Evangeline Dakin M.D.   On: 11/15/2018 18:48   Ct Chest Wo Contrast  Result Date: 11/15/2018 CLINICAL DATA:  Bilateral lower back pain with nausea. No dysuria or frank hematuria. Fever. Chest pain or shortness of breath. Evaluate for chronic loculated effusion. Recently in hospital with sepsis. EXAM: CT CHEST, ABDOMEN AND PELVIS WITHOUT CONTRAST TECHNIQUE: Multidetector CT imaging of the chest, abdomen and pelvis was performed following the standard protocol without IV contrast. COMPARISON:  CT of the abdomen and pelvis October 27, 2018. PET-CT January 18, 2014. FINDINGS: CT CHEST FINDINGS Cardiovascular: The heart size is normal. Coronary artery calcifications are seen in the left coronary arteries. Atherosclerotic changes are seen in the nonaneurysmal thoracic aorta. The pulmonary arteries are normal in caliber centrally. Mediastinum/Nodes: The thyroid and esophagus are unremarkable. No definite adenopathy. The left Port-A-Cath terminates in the central SVC. Lungs/Pleura: The trachea is unremarkable. Distortion of the central airways from previous radiation again identified. Mild bronchiectasis in the lingula is identified. A nodule in the lingula on series 4, image 84 measuring 7 mm is likely mucus plugging. Similar findings were seen in the lingula on the PET-CT from 2015.  Paramediastinal soft tissue is consistent with postradiation change which is similar since 2015. Rounded ground-glass is seen in the right base on series 4, image 99, new since the CT scan from October 27, 2018. Mild ground-glass in the left base on image 99 is also new. Mild more focal opacity on the left such as on image 72 is likely infectious. No other suspicious infiltrates. There is a right-sided pleural effusion which is similar to smaller in the base compared October 27, 2018. There is a component towards the apex which may be loculated, larger since 2015 and not imaged on the April 2020 study. No definite left-sided pleural effusion. Musculoskeletal: See below CT ABDOMEN PELVIS FINDINGS Hepatobiliary: High attenuation in the gallbladder could represent sludge or stones. No wall thickening identified. No liver masses are noted. Pancreas: Unremarkable. No pancreatic ductal dilatation or surrounding inflammatory changes. Spleen: Normal in size without focal abnormality. Adrenals/Urinary Tract: Adrenal glands are unremarkable. Kidneys are normal, without renal calculi, focal lesion, or hydronephrosis. Bladder is unremarkable. Stomach/Bowel: Other than a small hiatal hernia, the stomach is normal. There is fecal material in the distal ileum. Otherwise, the small bowel is unremarkable. Extensive colonic diverticuli are identified without evidence of diverticulitis. There is moderate fecal loading throughout the colon. The appendix is normal. Vascular/Lymphatic: Atherosclerotic changes are seen in the nonaneurysmal aorta. No adenopathy. Reproductive: Uterus and bilateral adnexa are unremarkable. Other: No abdominal wall hernia or abnormality. No abdominopelvic ascites. Musculoskeletal: No acute or significant osseous findings. IMPRESSION: 1. The right-sided pleural effusion is smaller at the base compared 2 October 27, 2018. There is also a component of pleural fluid at the right  apex which is larger compared to 2015 but  was not imaged on the April 2020 study. The apically component is likely loculated. 2. Rounded ground-glass opacity the right base and mild patchy ground-glass opacity in left base, not present October 27, 2018, are favored to represent an infectious process. The ground-glass nature raises the possibility of atypical infections. There is also more focal patchy opacity in the left base which is likely infectious as well. Recommend treatment with short-term follow-up to ensure resolution of these findings. 3. Post radiation changes and scarring in the chest. 4. High attenuation in the gallbladder could represent sludge or stones. There is no wall thickening or pericholecystic fluid identified. 5. Atherosclerosis in the thoracic and abdominal aorta. Coronary artery calcifications. 6. Fecal loading throughout the colon. 7. Fecal material in the distal ileum likely sequela of slow transit of uncertain etiology. 8. Extensive colonic diverticulosis seen without diverticulitis. Electronically Signed   By: Dorise Bullion III M.D   On: 11/15/2018 10:59   US Renal  Result Date: 11/16/2018 CLINICAL DATA:  Acute kidney injury. EXAM: RENAL / URINARY TRACT ULTRASOUND COMPLETE COMPARISON:  CT scan of November 15, 2018. FINDINGS: Right Kidney: Renal measurements: 9.2 x 4.4 x 4.2 cm = volume: 88 mL. Mildly increased echogenicity of renal parenchyma is noted. No mass or hydronephrosis visualized. Left Kidney: Renal measurements: 9.9 x 5.4 x 4.6 cm = volume: 130 mL. Mildly increased echogenicity of renal parenchyma is noted. No mass or hydronephrosis visualized. Bladder: Appears normal for degree of bladder distention. IMPRESSION: Mildly increased echogenicity of renal parenchyma is noted bilaterally suggesting medical renal disease. No hydronephrosis or renal obstruction is noted. Electronically Signed   By: Marijo Conception M.D.   On: 11/16/2018 09:27   Dg Chest Port 1 View  Result Date: 11/17/2018 CLINICAL DATA:  Shortness of  breath. History of non-Hodgkin's lymphoma diagnosed 2014. EXAM: PORTABLE CHEST 1 VIEW COMPARISON:  11/15/2018, 03/01/2018 and CT 11/15/2018 FINDINGS: Left subclavian Port-A-Cath unchanged. Chronic coarse interstitial changes bilaterally most prominent over the mid to upper lungs. Moderate size right effusion with fluid tracking to the apex without significant change. Slight worsening opacification over the mid lung. Cardiomediastinal silhouette and remainder of the exam is unchanged. IMPRESSION: Moderate stable right pleural effusion with loculated component over the apex. Slight worsening opacification over the right midlung which may be due to atelectasis or infection. Chronic coarse bilateral interstitial changes. Electronically Signed   By: Marin Olp M.D.   On: 11/17/2018 08:21    EKG:   Orders placed or performed during the hospital encounter of 11/15/18  . ED EKG 12-Lead  . ED EKG 12-Lead    ASSESSMENT AND PLAN:   #1.  Acute respiratory distress with subjective fevers, patient feels worse with tachypnea, respiratory distress, oxygen saturation 91% on 2 L, chest x-ray repeated showed no pneumothorax or worsening pneumonia, but pulmonary recommends to keep her 1 more day discontinue IV fluids, received 1 dose IV Lasix.  Patient is now on 5 L of oxygen.  Will update family.  Continue bronchodilators, empiric antibiotics with Levaquin, possible discharge home tomorrow if respiratory status improves. #2. acute kidney injury secondary to dehydration, improved with IV hydration, renal ultrasound did not show any acute obstruction, renal failure improved.  Discontinue IV fluids. 3.  Hypokalemia: Improved. 4.  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 #5 hyperlipidemia 6. hypothyroidism: Continue Synthyroid. 7..  Paroxysmal atrial fibrillation, follows up with Dr. Ubaldo Glassing, patient is on amiodarone/ Recent admission for sepsis, Patient main complaint is fatigue, chronic respiratory issues, Possible discharge home tomorrow.  More than 50% time spent in counseling, coordination of care All the records are reviewed and case discussed with Care Management/Social Workerr. Management plans discussed with the patient, family and they are in agreement.  CODE STATUS: full  TOTAL TIME TAKING CARE OF THIS PATIENT: 38 minutes.   POSSIBLE D/C IN 1-2DAYS, DEPENDING ON CLINICAL CONDITION.   Epifanio Lesches M.D on 11/17/2018 at 8:52 AM  Between 7am to 6pm - Pager - [phone]  After 6pm go to [email or web address] - password [email or web address] [facility]  Office  [phone]  CC: Primary care physician; Baxter Hire, MD   Note: This dictation was prepared with Dragon dictation along with smaller phrase technology. Any transcriptional errors that result from this process are unintentional.

## 2018-11-17 NOTE — Progress Notes (Signed)
PHARMACY NOTE:  ANTIMICROBIAL RENAL DOSAGE ADJUSTMENT  Current antimicrobial regimen includes a mismatch between antimicrobial dosage and estimated renal function.  As per policy approved by the Pharmacy & Therapeutics and Medical Executive Committees, the antimicrobial dosage will be adjusted accordingly.  Current antimicrobial dosage:  Levofloxacin 500 mg q48H   Indication: HCPA  Renal Function:  Estimated Creatinine Clearance: 31.7 mL/min (A) (by C-G formula based on SCr of 1.27 mg/dL (H)).     Antimicrobial dosage has been changed to:  Levofloxacin 750 mg q48H   Thank you for allowing pharmacy to be a part of this patient's care.  Alivya Wegman A, [facility] 11/17/2018 10:10 AM

## 2018-11-18 MED ORDER — LEVOFLOXACIN 250 MG PO TABS: 250.0000 mg | ORAL_TABLET | Freq: Every day | ORAL | 0 refills | Status: DC

## 2018-11-18 NOTE — Progress Notes (Signed)
Anna Mcbride to be D/C'd home per MD order.  Discussed prescriptions and follow up appointments with the patient. Prescriptions given to patient, medication list explained in detail. Pt verbalized understanding.  Allergies as of 11/18/2018      Reactions   Hydrocodone-homatropine Itching   Tussionex Pennkinetic Er [hydrocod Polst-cpm Polst Er] Itching   Ciprofloxacin Rash      Medication List    STOP taking these medications   cefdinir 300 MG capsule Commonly known as:  OMNICEF     TAKE these medications   acetaminophen 325 MG tablet Commonly known as:  TYLENOL Take 2 tablets (650 mg total) by mouth every 6 (six) hours as needed for mild pain (headache).   acetylcysteine 20 % nebulizer solution Commonly known as:  MUCOMYST Take 4 mLs by nebulization every 6 (six) hours.   albuterol 108 (90 Base) MCG/ACT inhaler Commonly known as:  VENTOLIN HFA Inhale 1 puff into the lungs every 6 (six) hours as needed for wheezing or shortness of breath.   ALPRAZolam 0.5 MG tablet Commonly known as:  XANAX Take 1 tablet (0.5 mg total) by mouth at bedtime. What changed:    when to take this  reasons to take this   amiodarone 400 MG tablet Commonly known as:  PACERONE Take 1 tablet (400 mg total) by mouth daily. What changed:  how much to take   citalopram 20 MG tablet Commonly known as:  CELEXA TAKE 1 TABLET BY MOUTH ONCE DAILY   feeding supplement (ENSURE ENLIVE) Liqd Take 237 mLs by mouth 3 (three) times daily between meals.   Flutter Devi Use 10-15 times daily   furosemide 20 MG tablet Commonly known as:  Lasix Take 1 tablet (20 mg total) by mouth daily.   guaiFENesin 600 MG 12 hr tablet Commonly known as:  MUCINEX Take 600 mg by mouth daily.   ipratropium-albuterol 0.5-2.5 (3) MG/3ML Soln Commonly known as:  DUONEB Take 3 mLs by nebulization every 6 (six) hours.   lactobacillus acidophilus & bulgar chewable tablet Chew 1 tablet by mouth 3 (three) times daily with  meals.   levofloxacin 250 MG tablet Commonly known as:  Levaquin Take 1 tablet (250 mg total) by mouth daily.   levothyroxine 25 MCG tablet Commonly known as:  SYNTHROID TAKE 1 TABLET BY MOUTH ONCE DAILY ON AN EMPTY STOMACH. WAIT 30 MINUTES BEFORE TAKING OTHER MEDS. What changed:  See the new instructions.   multivitamin with minerals Tabs tablet Take 1 tablet by mouth daily.   omeprazole 20 MG capsule Commonly known as:  PRILOSEC TAKE 1 CAPSULE BY MOUTH TWICE DAILY BEFORE MEALS What changed:  See the new instructions.   potassium chloride SA 20 MEQ tablet Commonly known as:  K-DUR Take 1 tablet (20 mEq total) by mouth daily.   predniSONE 10 MG tablet Commonly known as:  DELTASONE Take 2.5 tablets by mouth daily.   vitamin C 500 MG tablet Commonly known as:  ASCORBIC ACID Take 1,000 mg by mouth daily.       Vitals:   11/18/18 0937 11/18/18 1152  BP: (!) 90/56 (!) 88/50  Pulse: (!) 106 (!) 104  Resp: 20   Temp:    SpO2: 98% 99%    Skin clean, dry and intact without evidence of skin break down, no evidence of skin tears noted. IV catheter discontinued intact. Site without signs and symptoms of complications. Dressing and pressure applied. Pt denies pain at this time. No complaints noted.  An After Visit  Summary was printed and given to the patient. Patient escorted via [place], and D/C home via private auto.  Chuck Hint RN [facility] [employer]

## 2018-11-18 NOTE — Care Management Important Message (Signed)
Important Message  Patient Details  Name: Anna Mcbride MRN: [id number] Date of Birth: [birth year]   Medicare Important Message Given:  Yes    Dannette Barbara 11/18/2018, 10:16 AM

## 2018-11-18 NOTE — TOC Transition Note (Signed)
Transition of Care [facility]) - CM/SW Discharge Note   Patient Details  Name: Anna Mcbride MRN: [id number] Date of Birth: 06-May-1946  Transition of Care [facility]) CM/SW Contact:  Shela Leff, LCSW Phone Number: 11/18/2018, 11:23 AM   Clinical Narrative:   Patient discharging today to return home. CSW notified [facility] and spoke with Myriam Jacobson about resuming her services. CSW obtained orders from Dr. Darvin Neighbours. No further needs at this time.     Final next level of care: [place] Barriers to Discharge: No Barriers Identified   Patient Goals and CMS Choice     Choice offered to / list presented to : NA  Discharge Placement                       Discharge Plan and Services   Discharge Planning Services: CM Consult                        [facility] Agency: [facility] Date [place]: 11/18/18 Time [place]: 8250 Representative spoke with at [place]: [place] ([place]) Interventions     Readmission Risk Interventions Readmission Risk Prevention Plan 11/16/2018 11/16/2018 10/29/2018  Transportation Screening Complete Complete Complete  PCP or Specialist Appt within 3-5 Days - - Complete  HRI or [place] - - Complete  Social Work Consult for [place] Planning/Counseling - - Patient refused  Palliative Care Screening - - Not Applicable  Medication Review Press photographer) Complete - Complete  HRI or Home Care Consult Complete - -  Palliative Care Screening Complete - -  [place] Not Applicable - -  Some recent data might be hidden

## 2018-11-18 NOTE — Progress Notes (Signed)
Pulmonary Medicine          Date: 11/18/2018,   MRN# [id number] Anna Mcbride October 14, 1945     AdmissionWeight: 53 kg                 CurrentWeight: 53 kg       SUBJECTIVE   Patient states that she is feeling well, overall she is very weak and frail with poor prognosis long-term.  Patient states this is her baseline and she would like to go home today.  She is hemodynamically stable currently, will make appointment to be seen in office on tuesday.  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   NASAL SINUS SURGERY  08/07/05 09/09/05   x 2    VIDEO BRONCHOSCOPY Bilateral 11/30/2014   Procedure: VIDEO BRONCHOSCOPY WITHOUT FLUORO;  Surgeon: Flora Lipps, MD;  Location: [facility] ORS;  Service: Cardiopulmonary;  Laterality: Bilateral;     FAMILY HISTORY   Family History  Problem Relation Age of Onset   Asthma Son    Congestive Heart Failure Mother    Diabetes Brother    Pulmonary embolism Father    Breast cancer Neg Hx      SOCIAL HISTORY   Social History   Tobacco Use   Smoking status: Never Smoker   Smokeless tobacco: Never Used  Substance Use Topics   Alcohol use: Not Currently    Frequency: Never    Comment: a couple glasses of wine a year   Drug use: No     MEDICATIONS    Home Medication:  Current Outpatient Rx   Order #: [id number] Class: Print    Current Medication:  Current  Facility-Administered Medications:    acetaminophen (TYLENOL) tablet 650 mg, 650 mg, Oral, Q6H PRN **OR** acetaminophen (TYLENOL) suppository 650 mg, 650 mg, Rectal, Q6H PRN, Mayo, Pete Pelt, MD   acetylcysteine (MUCOMYST) 20 % nebulizer / oral solution 4 mL, 4 mL, Nebulization, Q6H, Mayo, Pete Pelt, MD, 4 mL at 11/18/18 0741   acidophilus (RISAQUAD) capsule 1 capsule, 1 capsule, Oral, TID WC, Cyndee Brightly M, RPH, 1 capsule at 11/17/18 1716   albuterol (VENTOLIN HFA) 108 (90 Base) MCG/ACT inhaler 1 puff, 1 puff, Inhalation, Q6H PRN, Mayo, Pete Pelt, MD   ALPRAZolam Duanne Moron) tablet 0.5 mg, 0.5 mg, Oral, TID PRN, Mayo, Pete Pelt, MD, 0.5 mg at 11/17/18 2205   amiodarone (PACERONE) tablet 200 mg, 200 mg, Oral, Daily, Mayo, Pete Pelt, MD, 200 mg at 11/17/18 2229   bisacodyl (DULCOLAX) suppository 10 mg, 10 mg, Rectal, Daily PRN, Mayo, Pete Pelt, MD, 10 mg at 11/16/18 1503   enoxaparin (LOVENOX) injection 40 mg, 40 mg, Subcutaneous, Q24H, Vianne Bulls, Snehalatha, MD, 40 mg at 11/17/18 1716   feeding supplement (ENSURE ENLIVE) (ENSURE ENLIVE) liquid 237 mL, 237 mL, Oral, TID BM, Mayo, Pete Pelt, MD, 237 mL at 11/17/18 1942   ipratropium-albuterol (DUONEB) 0.5-2.5 (3) MG/3ML nebulizer solution 3 mL, 3  mL, Nebulization, Q6H, Mayo, Pete Pelt, MD, 3 mL at 11/18/18 0741   levofloxacin (LEVAQUIN) IVPB 750 mg, 750 mg, Intravenous, Q48H, Epifanio Lesches, MD, Last Rate: 100 mL/hr at 11/17/18 1748   levothyroxine (SYNTHROID) tablet 25 mcg, 25 mcg, Oral, Q0600, Mayo, Pete Pelt, MD, 25 mcg at 11/18/18 0553   multivitamin with minerals tablet 1 tablet, 1 tablet, Oral, Daily, Mayo, Pete Pelt, MD, 1 tablet at 11/17/18 0922   ondansetron (ZOFRAN) tablet 4 mg, 4 mg, Oral, Q6H PRN **OR** ondansetron (ZOFRAN) injection 4 mg, 4 mg, Intravenous, Q6H PRN, Mayo, Pete Pelt, MD   pantoprazole (PROTONIX) EC tablet 40 mg, 40 mg, Oral, Daily, Mayo, Pete Pelt, MD, 40 mg at 11/17/18 1884   polyethylene glycol  (MIRALAX / GLYCOLAX) packet 17 g, 17 g, Oral, Daily, Mayo, Pete Pelt, MD, 17 g at 11/17/18 1660   predniSONE (DELTASONE) tablet 10 mg, 10 mg, Oral, Daily, Ottie Glazier, MD, 10 mg at 11/17/18 6301   senna-docusate (Senokot-S) tablet 2 tablet, 2 tablet, Oral, BID, Mayo, Pete Pelt, MD, 2 tablet at 11/17/18 6010  Facility-Administered Medications Ordered in Other Encounters:    heparin lock flush 100 unit/mL, 500 Units, Intravenous, Once, Brahmanday, Lenetta Quaker R, MD   sodium chloride flush (NS) 0.9 % injection 10 mL, 10 mL, Intravenous, Once, Cammie Sickle, MD    ALLERGIES   Hydrocodone-homatropine; Tussionex pennkinetic er [employer] polst-cpm polst er]; and Ciprofloxacin     REVIEW OF SYSTEMS    Review of Systems:  Gen:  Denies  fever, sweats, chills weigh loss  HEENT: Denies blurred vision, double vision, ear pain, eye pain, hearing loss, nose bleeds, sore throat Cardiac:  No dizziness, chest pain or heaviness, chest tightness,edema Resp:   Denies cough or sputum porduction, shortness of breath,wheezing, hemoptysis,  Gi: Denies swallowing difficulty, stomach pain, nausea or vomiting, diarrhea, constipation, bowel incontinence Gu:  Denies bladder incontinence, burning urine Ext:   Denies Joint pain, stiffness or swelling Skin: Denies  skin rash, easy bruising or bleeding or hives Endoc:  Denies polyuria, polydipsia , polyphagia or weight change Psych:   Denies depression, insomnia or hallucinations   Other:  All other systems negative   VS: BP (!) 94/56 (BP Location: Right Arm)    Pulse (!) 107    Temp 98.6 F (37 C) (Oral)    Resp 18    Ht 5\' 3"  (1.6 m)    Wt 53 kg    LMP  (LMP Unknown)    SpO2 97%    BMI 20.70 kg/m      PHYSICAL EXAM    GENERAL:NAD, no fevers, chills, no weakness no fatigue HEAD: Normocephalic, atraumatic.  EYES: Pupils equal, round, reactive to light. Extraocular muscles intact. No scleral icterus.  MOUTH: Moist mucosal membrane. Dentition  intact. No abscess noted.  EAR, NOSE, THROAT: Clear without exudates. No external lesions.  NECK: Supple. No thyromegaly. No nodules. No JVD.  PULMONARY: Bilateral rhonchorous breath sounds which is patient's baseline CARDIOVASCULAR: S1 and S2. Regular rate and rhythm. No murmurs, rubs, or gallops. No edema. Pedal pulses 2+ bilaterally.  GASTROINTESTINAL: Soft, nontender, nondistended. No masses. Positive bowel sounds. No hepatosplenomegaly.  MUSCULOSKELETAL: No swelling, clubbing, or edema. Range of motion full in all extremities.  NEUROLOGIC: Cranial nerves II through XII are intact. No gross focal neurological deficits. Sensation intact. Reflexes intact.  SKIN: No ulceration, lesions, rashes, or cyanosis. Skin warm and dry. Turgor intact.  PSYCHIATRIC: Mood, affect within normal limits. The patient is awake, alert and oriented  x 3. Insight, judgment intact.       IMAGING    Ct Abdomen Pelvis Wo Contrast  Result Date: 11/15/2018 CLINICAL DATA:  Bilateral lower back pain with nausea. No dysuria or frank hematuria. Fever. Chest pain or shortness of breath. Evaluate for chronic loculated effusion. Recently in hospital with sepsis. EXAM: CT CHEST, ABDOMEN AND PELVIS WITHOUT CONTRAST TECHNIQUE: Multidetector CT imaging of the chest, abdomen and pelvis was performed following the standard protocol without IV contrast. COMPARISON:  CT of the abdomen and pelvis October 27, 2018. PET-CT January 18, 2014. FINDINGS: CT CHEST FINDINGS Cardiovascular: The heart size is normal. Coronary artery calcifications are seen in the left coronary arteries. Atherosclerotic changes are seen in the nonaneurysmal thoracic aorta. The pulmonary arteries are normal in caliber centrally. Mediastinum/Nodes: The thyroid and esophagus are unremarkable. No definite adenopathy. The left Port-A-Cath terminates in the central SVC. Lungs/Pleura: The trachea is unremarkable. Distortion of the central airways from previous radiation again  identified. Mild bronchiectasis in the lingula is identified. A nodule in the lingula on series 4, image 84 measuring 7 mm is likely mucus plugging. Similar findings were seen in the lingula on the PET-CT from 2015. Paramediastinal soft tissue is consistent with postradiation change which is similar since 2015. Rounded ground-glass is seen in the right base on series 4, image 99, new since the CT scan from October 27, 2018. Mild ground-glass in the left base on image 99 is also new. Mild more focal opacity on the left such as on image 72 is likely infectious. No other suspicious infiltrates. There is a right-sided pleural effusion which is similar to smaller in the base compared October 27, 2018. There is a component towards the apex which may be loculated, larger since 2015 and not imaged on the April 2020 study. No definite left-sided pleural effusion. Musculoskeletal: See below CT ABDOMEN PELVIS FINDINGS Hepatobiliary: High attenuation in the gallbladder could represent sludge or stones. No wall thickening identified. No liver masses are noted. Pancreas: Unremarkable. No pancreatic ductal dilatation or surrounding inflammatory changes. Spleen: Normal in size without focal abnormality. Adrenals/Urinary Tract: Adrenal glands are unremarkable. Kidneys are normal, without renal calculi, focal lesion, or hydronephrosis. Bladder is unremarkable. Stomach/Bowel: Other than a small hiatal hernia, the stomach is normal. There is fecal material in the distal ileum. Otherwise, the small bowel is unremarkable. Extensive colonic diverticuli are identified without evidence of diverticulitis. There is moderate fecal loading throughout the colon. The appendix is normal. Vascular/Lymphatic: Atherosclerotic changes are seen in the nonaneurysmal aorta. No adenopathy. Reproductive: Uterus and bilateral adnexa are unremarkable. Other: No abdominal wall hernia or abnormality. No abdominopelvic ascites. Musculoskeletal: No acute or  significant osseous findings. IMPRESSION: 1. The right-sided pleural effusion is smaller at the base compared 2 October 27, 2018. There is also a component of pleural fluid at the right apex which is larger compared to 2015 but was not imaged on the April 2020 study. The apically component is likely loculated. 2. Rounded ground-glass opacity the right base and mild patchy ground-glass opacity in left base, not present October 27, 2018, are favored to represent an infectious process. The ground-glass nature raises the possibility of atypical infections. There is also more focal patchy opacity in the left base which is likely infectious as well. Recommend treatment with short-term follow-up to ensure resolution of these findings. 3. Post radiation changes and scarring in the chest. 4. High attenuation in the gallbladder could represent sludge or stones. There is no wall thickening or pericholecystic  fluid identified. 5. Atherosclerosis in the thoracic and abdominal aorta. Coronary artery calcifications. 6. Fecal loading throughout the colon. 7. Fecal material in the distal ileum likely sequela of slow transit of uncertain etiology. 8. Extensive colonic diverticulosis seen without diverticulitis. Electronically Signed   By: Dorise Bullion III M.D   On: 11/15/2018 10:59   Dg Lumbar Spine 2-3 Views  Result Date: 11/15/2018 CLINICAL DATA:  73 year old admitted for acute kidney injury and possible atypical pneumonia, with chronic low back pain. EXAM: LUMBAR SPINE - 2-3 VIEW COMPARISON:  Bone window images from CT chest abdomen and pelvis performed earlier same day. FINDINGS: Five non-rib-bearing lumbar vertebrae with anatomic alignment. No fractures. Mild disc space narrowing at L3-4. Remaining disc spaces well-preserved. Facet degenerative changes at L3-4, L4-5 and L5-S1. Sacroiliac joints anatomically aligned without significant degenerative change. Aortoiliac atherosclerosis. IMPRESSION: 1. No acute osseous abnormality.  2. Mild degenerative disc disease at L3-4. 3. Facet degenerative changes at L3-4, L4-5 and L5-S1. Electronically Signed   By: Evangeline Dakin M.D.   On: 11/15/2018 18:48   Ct Chest Wo Contrast  Result Date: 11/15/2018 CLINICAL DATA:  Bilateral lower back pain with nausea. No dysuria or frank hematuria. Fever. Chest pain or shortness of breath. Evaluate for chronic loculated effusion. Recently in hospital with sepsis. EXAM: CT CHEST, ABDOMEN AND PELVIS WITHOUT CONTRAST TECHNIQUE: Multidetector CT imaging of the chest, abdomen and pelvis was performed following the standard protocol without IV contrast. COMPARISON:  CT of the abdomen and pelvis October 27, 2018. PET-CT January 18, 2014. FINDINGS: CT CHEST FINDINGS Cardiovascular: The heart size is normal. Coronary artery calcifications are seen in the left coronary arteries. Atherosclerotic changes are seen in the nonaneurysmal thoracic aorta. The pulmonary arteries are normal in caliber centrally. Mediastinum/Nodes: The thyroid and esophagus are unremarkable. No definite adenopathy. The left Port-A-Cath terminates in the central SVC. Lungs/Pleura: The trachea is unremarkable. Distortion of the central airways from previous radiation again identified. Mild bronchiectasis in the lingula is identified. A nodule in the lingula on series 4, image 84 measuring 7 mm is likely mucus plugging. Similar findings were seen in the lingula on the PET-CT from 2015. Paramediastinal soft tissue is consistent with postradiation change which is similar since 2015. Rounded ground-glass is seen in the right base on series 4, image 99, new since the CT scan from October 27, 2018. Mild ground-glass in the left base on image 99 is also new. Mild more focal opacity on the left such as on image 72 is likely infectious. No other suspicious infiltrates. There is a right-sided pleural effusion which is similar to smaller in the base compared October 27, 2018. There is a component towards the apex which  may be loculated, larger since 2015 and not imaged on the April 2020 study. No definite left-sided pleural effusion. Musculoskeletal: See below CT ABDOMEN PELVIS FINDINGS Hepatobiliary: High attenuation in the gallbladder could represent sludge or stones. No wall thickening identified. No liver masses are noted. Pancreas: Unremarkable. No pancreatic ductal dilatation or surrounding inflammatory changes. Spleen: Normal in size without focal abnormality. Adrenals/Urinary Tract: Adrenal glands are unremarkable. Kidneys are normal, without renal calculi, focal lesion, or hydronephrosis. Bladder is unremarkable. Stomach/Bowel: Other than a small hiatal hernia, the stomach is normal. There is fecal material in the distal ileum. Otherwise, the small bowel is unremarkable. Extensive colonic diverticuli are identified without evidence of diverticulitis. There is moderate fecal loading throughout the colon. The appendix is normal. Vascular/Lymphatic: Atherosclerotic changes are seen in the nonaneurysmal aorta.  No adenopathy. Reproductive: Uterus and bilateral adnexa are unremarkable. Other: No abdominal wall hernia or abnormality. No abdominopelvic ascites. Musculoskeletal: No acute or significant osseous findings. IMPRESSION: 1. The right-sided pleural effusion is smaller at the base compared 2 October 27, 2018. There is also a component of pleural fluid at the right apex which is larger compared to 2015 but was not imaged on the April 2020 study. The apically component is likely loculated. 2. Rounded ground-glass opacity the right base and mild patchy ground-glass opacity in left base, not present October 27, 2018, are favored to represent an infectious process. The ground-glass nature raises the possibility of atypical infections. There is also more focal patchy opacity in the left base which is likely infectious as well. Recommend treatment with short-term follow-up to ensure resolution of these findings. 3. Post radiation  changes and scarring in the chest. 4. High attenuation in the gallbladder could represent sludge or stones. There is no wall thickening or pericholecystic fluid identified. 5. Atherosclerosis in the thoracic and abdominal aorta. Coronary artery calcifications. 6. Fecal loading throughout the colon. 7. Fecal material in the distal ileum likely sequela of slow transit of uncertain etiology. 8. Extensive colonic diverticulosis seen without diverticulitis. Electronically Signed   By: Dorise Bullion III M.D   On: 11/15/2018 10:59   Ct Abdomen Pelvis W Contrast  Result Date: 10/27/2018 CLINICAL DATA:  Right-sided abdominal and flank pain. Acute abdominal pain. EXAM: CT ABDOMEN AND PELVIS WITH CONTRAST TECHNIQUE: Multidetector CT imaging of the abdomen and pelvis was performed using the standard protocol following bolus administration of intravenous contrast. CONTRAST:  2mL OMNIPAQUE IOHEXOL 300 MG/ML  SOLN COMPARISON:  Abdominal CT 12/19/2017. Chest radiograph earlier this day as well as 03/01/2018 FINDINGS: Motion artifact limits assessment of the chest, abdomen, and pelvis. Lower chest: Small layering left pleural effusion. Moderate right pleural effusion is partially loculated anteriorly. Vague geographic ground-glass opacities in the left lung base which are partially obscured by motion. Hepatobiliary: Slight heterogeneous enhancement. Small cyst in the right lobe is unchanged from prior exam. Gallbladder physiologically distended, no calcified stone. No biliary dilatation. Pancreas: Motion artifact. No ductal dilatation or inflammation. There is a 5 mm cyst in the proximal pancreatic body, image 32 series 2. Spleen: Normal in size without gross abnormality, motion obscured. Adrenals/Urinary Tract: Normal adrenal glands. Right kidney is slightly low in positioning. No hydronephrosis or perinephric edema. Slight delayed excretion on delayed phase imaging. Motion artifact limits detailed assessment. Urinary bladder  is physiologically distended. No bladder wall thickening. Stomach/Bowel: Small hiatal hernia. Stomach grossly unremarkable. No bowel obstruction. Enteric contrast reaches the colon. Diverticulosis from the mid transverse colon distally. Diverticular prominent in the sigmoid colon. No evidence of diverticulitis. Air in stool distends the rectum. No rectal wall thickening. No obvious bowel inflammation. Appendix not confidently visualized, no evidence of appendicitis. Vascular/Lymphatic: Advanced aortic atherosclerosis. IVC appears prominent. Moderate branch atherosclerosis. No acute vascular finding. Portal vein appears patent. No definite enlarged lymph nodes in the abdomen or pelvis. Reproductive: Uterus and bilateral adnexa are unremarkable. Ovaries are quiescent, normal for age. Other: No ascites or free air. No evidence of intra-abdominal abscess. Musculoskeletal: There are no acute or suspicious osseous abnormalities. Degenerative change in the lower lumbar spine. IMPRESSION: 1. Slight heterogeneity of the liver is nonspecific, but may be due to elevated right heart pressures given prominent IVC. No other acute finding in the abdomen or pelvis. Motion artifact slightly limits detailed assessment. 2. Bilateral pleural effusions, small on the left and moderate  on the right and partially loculated. Chest x-ray earlier today demonstrated similar radiographic appearance of the chest dating back to 03/01/2018, and findings may be chronic. 3. Colonic diverticulosis without diverticulitis. 4. Incidental 5 mm cyst in the proximal pancreatic body. Recommend follow up pre and post contrast MRI/MRCP or pancreatic protocol CT in 2 years. This recommendation follows ACR consensus guidelines: Management of Incidental Pancreatic Cysts: A White Paper of the ACR Incidental Findings Committee. J Am Coll Radiol 5465;03:[id number]. 5.  Advanced Aortic atherosclerosis (ICD10-I70.0). Electronically Signed   By: Keith Rake M.D.    On: 10/27/2018 21:37   US Renal  Result Date: 11/16/2018 CLINICAL DATA:  Acute kidney injury. EXAM: RENAL / URINARY TRACT ULTRASOUND COMPLETE COMPARISON:  CT scan of November 15, 2018. FINDINGS: Right Kidney: Renal measurements: 9.2 x 4.4 x 4.2 cm = volume: 88 mL. Mildly increased echogenicity of renal parenchyma is noted. No mass or hydronephrosis visualized. Left Kidney: Renal measurements: 9.9 x 5.4 x 4.6 cm = volume: 130 mL. Mildly increased echogenicity of renal parenchyma is noted. No mass or hydronephrosis visualized. Bladder: Appears normal for degree of bladder distention. IMPRESSION: Mildly increased echogenicity of renal parenchyma is noted bilaterally suggesting medical renal disease. No hydronephrosis or renal obstruction is noted. Electronically Signed   By: Marijo Conception M.D.   On: 11/16/2018 09:27   Dg Chest Port 1 View  Result Date: 11/17/2018 CLINICAL DATA:  Shortness of breath. History of non-Hodgkin's lymphoma diagnosed 2014. EXAM: PORTABLE CHEST 1 VIEW COMPARISON:  11/15/2018, 03/01/2018 and CT 11/15/2018 FINDINGS: Left subclavian Port-A-Cath unchanged. Chronic coarse interstitial changes bilaterally most prominent over the mid to upper lungs. Moderate size right effusion with fluid tracking to the apex without significant change. Slight worsening opacification over the mid lung. Cardiomediastinal silhouette and remainder of the exam is unchanged. IMPRESSION: Moderate stable right pleural effusion with loculated component over the apex. Slight worsening opacification over the right midlung which may be due to atelectasis or infection. Chronic coarse bilateral interstitial changes. Electronically Signed   By: Marin Olp M.D.   On: 11/17/2018 08:21   Dg Chest Portable 1 View  Result Date: 11/15/2018 CLINICAL DATA:  73 year old female with history of non-Hodgkin's lymphoma diagnosed in 2014. Shortness of breath. EXAM: PORTABLE CHEST 1 VIEW COMPARISON:  Chest x-ray 10/30/2018.  FINDINGS: Left subclavian single-lumen porta cath with tip terminating in the distal superior vena cava. Extensive pleuroparenchymal architectural distortion and volume loss is again noted in the lungs bilaterally, most evident in the apex of the right hemithorax and in the base of the right hemithorax, similar to numerous prior examinations, most compatible with chronic scarring. No definite acute consolidative airspace disease. Chronic moderate right pleural effusion which appears partially loculated, stable compared to prior examinations. No left pleural effusion. No evidence of pulmonary edema. Heart size is normal. Aortic atherosclerosis. IMPRESSION: 1. Extensive scarring in the thorax redemonstrated with chronic moderate right partially loculated pleural effusion, similar to prior examinations, as above. No definite radiographic evidence of acute cardiopulmonary disease. Electronically Signed   By: Vinnie Langton M.D.   On: 11/15/2018 08:10   Dg Chest Port 1 View  Result Date: 10/30/2018 CLINICAL DATA:  Acute respiratory failure. EXAM: PORTABLE CHEST 1 VIEW COMPARISON:  10/29/2018 FINDINGS: Power port inserted from the left is unchanged with the tip in the SVC above the right atrium. Bilateral pulmonary and pleural scarring persists, with the possibility of some coexistent acute patchy infiltrates. Pattern appears quite similar to multiple previous films. No worsening  or new finding. IMPRESSION: No worsening or new finding. Extensive bilateral pleural and parenchymal scarring. Possible associated acute pulmonary infiltrate, particularly suspicious in the left upper lobe. Electronically Signed   By: Nelson Chimes M.D.   On: 10/30/2018 05:33   Dg Chest Port 1 View  Result Date: 10/29/2018 CLINICAL DATA:  Respiratory distress EXAM: PORTABLE CHEST 1 VIEW COMPARISON:  10/27/2018 FINDINGS: Left chest wall Port-A-Cath tip projects over the lower SVC, unchanged. Unchanged size of right pleural effusion.  Increased left basilar atelectasis. Upper right chest opacities are unchanged. IMPRESSION: Slightly increased left basilar atelectasis but otherwise unchanged appearance of chronic lung disease. Electronically Signed   By: Ulyses Jarred M.D.   On: 10/29/2018 20:57   Dg Chest Port 1 View  Result Date: 10/27/2018 CLINICAL DATA:  Fever and cough EXAM: PORTABLE CHEST 1 VIEW COMPARISON:  03/01/2018 FINDINGS: Extensive chronic lung disease apical scarring bilaterally. Extensive on the right with retraction of the right hilum. Left hilar retraction also unchanged due to apical scarring. Extensive pleuroparenchymal scarring in the right lung base unchanged. Diffuse parenchymal airspace density appears chronic and unchanged. Port-A-Cath tip in the SVC. IMPRESSION: Extensive chronic lung disease bilaterally right greater than left, stable from prior studies. No superimposed acute abnormality. Electronically Signed   By: Franchot Gallo M.D.   On: 10/27/2018 18:21      ASSESSMENT/PLAN   Acute respiratory distress with subjective fevers    - Patient states she is feeling well, currently at baseline    -Overall patient is chronically ill with long-term very poor prognosis status post hospice.  Will send home with close follow-up on outpatient basis- appointment for Tuesday  -Will finish 7-day Levaquin for CAP  -Patient on chronic steroids will continue -Please send incentive spirometer and Acapella mucus clearing device home with patient-encouraged to use multiple times daily -May DC home from pulmonary perspective   Thank you for allowing me to participate in the care of this patient.   Patient/Family are satisfied with care plan and all questions have been answered.   This document was prepared using Dragon voice recognition software and may include unintentional dictation errors.     Ottie Glazier, M.D.  Division of [place]

## 2018-11-19 ENCOUNTER — Encounter: Payer: Self-pay | Admitting: Emergency Medicine

## 2018-11-19 ENCOUNTER — Other Ambulatory Visit: Payer: Self-pay

## 2018-11-19 ENCOUNTER — Emergency Department: Payer: Medicare Other

## 2018-11-19 ENCOUNTER — Inpatient Hospital Stay
Admission: EM | Admit: 2018-11-19 | Discharge: 2018-11-20 | DRG: 186 | Disposition: A | Discharge status: Hospice | Payer: Medicare Other | Attending: Internal Medicine | Admitting: Internal Medicine

## 2018-11-19 DIAGNOSIS — Z79890 Hormone replacement therapy (postmenopausal): Secondary | ICD-10-CM

## 2018-11-19 DIAGNOSIS — J8410 Pulmonary fibrosis, unspecified: Secondary | ICD-10-CM | POA: Diagnosis present

## 2018-11-19 DIAGNOSIS — J9621 Acute and chronic respiratory failure with hypoxia: Secondary | ICD-10-CM | POA: Diagnosis present

## 2018-11-19 DIAGNOSIS — Z885 Allergy status to narcotic agent status: Secondary | ICD-10-CM

## 2018-11-19 DIAGNOSIS — Z20828 Contact with and (suspected) exposure to other viral communicable diseases: Secondary | ICD-10-CM | POA: Diagnosis present

## 2018-11-19 DIAGNOSIS — Z888 Allergy status to other drugs, medicaments and biological substances status: Secondary | ICD-10-CM | POA: Diagnosis not present

## 2018-11-19 DIAGNOSIS — J45909 Unspecified asthma, uncomplicated: Secondary | ICD-10-CM | POA: Diagnosis present

## 2018-11-19 DIAGNOSIS — Z8701 Personal history of pneumonia (recurrent): Secondary | ICD-10-CM | POA: Diagnosis not present

## 2018-11-19 DIAGNOSIS — Z6820 Body mass index (BMI) 20.0-20.9, adult: Secondary | ICD-10-CM

## 2018-11-19 DIAGNOSIS — Z7189 Other specified counseling: Secondary | ICD-10-CM | POA: Diagnosis not present

## 2018-11-19 DIAGNOSIS — R509 Fever, unspecified: Secondary | ICD-10-CM

## 2018-11-19 DIAGNOSIS — Z881 Allergy status to other antibiotic agents status: Secondary | ICD-10-CM | POA: Diagnosis not present

## 2018-11-19 DIAGNOSIS — J189 Pneumonia, unspecified organism: Secondary | ICD-10-CM

## 2018-11-19 DIAGNOSIS — Z7952 Long term (current) use of systemic steroids: Secondary | ICD-10-CM

## 2018-11-19 DIAGNOSIS — E441 Mild protein-calorie malnutrition: Secondary | ICD-10-CM | POA: Diagnosis present

## 2018-11-19 DIAGNOSIS — J90 Pleural effusion, not elsewhere classified: Principal | ICD-10-CM | POA: Diagnosis present

## 2018-11-19 DIAGNOSIS — Z79899 Other long term (current) drug therapy: Secondary | ICD-10-CM

## 2018-11-19 DIAGNOSIS — Z515 Encounter for palliative care: Secondary | ICD-10-CM | POA: Diagnosis not present

## 2018-11-19 DIAGNOSIS — Z8249 Family history of ischemic heart disease and other diseases of the circulatory system: Secondary | ICD-10-CM

## 2018-11-19 DIAGNOSIS — Z9484 Stem cells transplant status: Secondary | ICD-10-CM | POA: Diagnosis not present

## 2018-11-19 DIAGNOSIS — E039 Hypothyroidism, unspecified: Secondary | ICD-10-CM | POA: Diagnosis present

## 2018-11-19 DIAGNOSIS — A419 Sepsis, unspecified organism: Secondary | ICD-10-CM | POA: Diagnosis present

## 2018-11-19 DIAGNOSIS — Z66 Do not resuscitate: Secondary | ICD-10-CM | POA: Diagnosis not present

## 2018-11-19 DIAGNOSIS — R0602 Shortness of breath: Secondary | ICD-10-CM

## 2018-11-19 DIAGNOSIS — Z85118 Personal history of other malignant neoplasm of bronchus and lung: Secondary | ICD-10-CM | POA: Diagnosis not present

## 2018-11-19 DIAGNOSIS — Z825 Family history of asthma and other chronic lower respiratory diseases: Secondary | ICD-10-CM

## 2018-11-19 DIAGNOSIS — C8590 Non-Hodgkin lymphoma, unspecified, unspecified site: Secondary | ICD-10-CM | POA: Diagnosis present

## 2018-11-19 DIAGNOSIS — Z9221 Personal history of antineoplastic chemotherapy: Secondary | ICD-10-CM | POA: Diagnosis not present

## 2018-11-19 DIAGNOSIS — Z9889 Other specified postprocedural states: Secondary | ICD-10-CM

## 2018-11-19 LAB — CBC WITH DIFFERENTIAL/PLATELET
Abs Immature Granulocytes: 0.06 10*3/uL (ref 0.00–0.07)
Basophils Absolute: 0 10*3/uL (ref 0.0–0.1)
Basophils Relative: 0 %
Eosinophils Absolute: 0.1 10*3/uL (ref 0.0–0.5)
Eosinophils Relative: 1 %
HCT: 30.9 % — ABNORMAL LOW (ref 36.0–46.0)
Hemoglobin: 9.6 g/dL — ABNORMAL LOW (ref 12.0–15.0)
Immature Granulocytes: 1 %
Lymphocytes Relative: 5 %
Lymphs Abs: 0.5 10*3/uL — ABNORMAL LOW (ref 0.7–4.0)
MCH: 31.7 pg (ref 26.0–34.0)
MCHC: 31.1 g/dL (ref 30.0–36.0)
MCV: 102 fL — ABNORMAL HIGH (ref 80.0–100.0)
Monocytes Absolute: 0.7 10*3/uL (ref 0.1–1.0)
Monocytes Relative: 7 %
Neutro Abs: 8.8 10*3/uL — ABNORMAL HIGH (ref 1.7–7.7)
Neutrophils Relative %: 86 %
Platelets: 182 10*3/uL (ref 150–400)
RBC: 3.03 MIL/uL — ABNORMAL LOW (ref 3.87–5.11)
RDW: 14 % (ref 11.5–15.5)
WBC: 10.2 10*3/uL (ref 4.0–10.5)
nRBC: 0 % (ref 0.0–0.2)

## 2018-11-19 LAB — LACTIC ACID, PLASMA
Lactic Acid, Venous: 1.5 mmol/L (ref 0.5–1.9)
Lactic Acid, Venous: 1.3 mmol/L (ref 0.5–1.9)

## 2018-11-19 LAB — COMPREHENSIVE METABOLIC PANEL
ALT: 20 U/L (ref 0–44)
AST: 21 U/L (ref 15–41)
Albumin: 3.1 g/dL — ABNORMAL LOW (ref 3.5–5.0)
Alkaline Phosphatase: 83 U/L (ref 38–126)
Anion gap: 11 (ref 5–15)
BUN: 31 mg/dL — ABNORMAL HIGH (ref 8–23)
CO2: 28 mmol/L (ref 22–32)
Calcium: 9.2 mg/dL (ref 8.9–10.3)
Chloride: 100 mmol/L (ref 98–111)
Creatinine, Ser: 1.57 mg/dL — ABNORMAL HIGH (ref 0.44–1.00)
GFR calc Af Amer: 37 mL/min — ABNORMAL LOW (ref >60)
GFR calc non Af Amer: 32 mL/min — ABNORMAL LOW (ref >60)
Glucose, Bld: 114 mg/dL — ABNORMAL HIGH (ref 70–99)
Potassium: 3.8 mmol/L (ref 3.5–5.1)
Sodium: 139 mmol/L (ref 135–145)
Total Bilirubin: 0.9 mg/dL (ref 0.3–1.2)
Total Protein: 7 g/dL (ref 6.5–8.1)

## 2018-11-19 LAB — PROTIME-INR
INR: 1.3 — ABNORMAL HIGH (ref 0.8–1.2)
Prothrombin Time: 15.9 seconds — ABNORMAL HIGH (ref 11.4–15.2)

## 2018-11-19 LAB — SARS CORONAVIRUS 2 BY RT PCR (HOSPITAL ORDER, PERFORMED IN ~~LOC~~ HOSPITAL LAB): SARS Coronavirus 2: NEGATIVE

## 2018-11-19 MED ORDER — ADULT MULTIVITAMIN W/MINERALS CH
1.0000 | ORAL_TABLET | Freq: Every day | ORAL | Status: DC
  Administered 2018-11-19 – 2018-11-20 (×2): 1 via ORAL
  Filled 2018-11-19 (×2): qty 1

## 2018-11-19 MED ORDER — ENOXAPARIN SODIUM 30 MG/0.3ML ~~LOC~~ SOLN
30.0000 mg | SUBCUTANEOUS | Status: DC
  Administered 2018-11-19: 30 mg via SUBCUTANEOUS
  Filled 2018-11-19: qty 0.3

## 2018-11-19 MED ORDER — SODIUM CHLORIDE 0.9 % IV BOLUS: 1000.0000 mL | Freq: Once | INTRAVENOUS | Status: DC

## 2018-11-19 MED ORDER — SODIUM CHLORIDE 0.9% FLUSH
3.0000 mL | Freq: Once | INTRAVENOUS | Status: AC
  Administered 2018-11-19: 17:00:00 3 mL via INTRAVENOUS

## 2018-11-19 MED ORDER — SODIUM CHLORIDE 0.9 % IV SOLN
Freq: Once | INTRAVENOUS | Status: AC
  Administered 2018-11-19: 18:00:00 via INTRAVENOUS

## 2018-11-19 MED ORDER — SODIUM CHLORIDE 0.9 % IV SOLN
Freq: Once | INTRAVENOUS | Status: AC
  Administered 2018-11-19: 23:00:00 via INTRAVENOUS

## 2018-11-19 MED ORDER — SODIUM CHLORIDE 0.9 % IV SOLN
1.0000 g | Freq: Once | INTRAVENOUS | Status: AC
  Administered 2018-11-19: 19:00:00 1 g via INTRAVENOUS
  Filled 2018-11-19: qty 1

## 2018-11-19 MED ORDER — IPRATROPIUM-ALBUTEROL 0.5-2.5 (3) MG/3ML IN SOLN
3.0000 mL | Freq: Four times a day (QID) | RESPIRATORY_TRACT | Status: DC
  Administered 2018-11-20 (×3): 3 mL via RESPIRATORY_TRACT
  Filled 2018-11-19 (×3): qty 3

## 2018-11-19 MED ORDER — IPRATROPIUM-ALBUTEROL 0.5-2.5 (3) MG/3ML IN SOLN
3.0000 mL | Freq: Once | RESPIRATORY_TRACT | Status: AC
  Administered 2018-11-19: 18:00:00 3 mL via RESPIRATORY_TRACT
  Filled 2018-11-19: qty 3

## 2018-11-19 MED ORDER — CITALOPRAM HYDROBROMIDE 20 MG PO TABS
20.0000 mg | ORAL_TABLET | Freq: Every day | ORAL | Status: DC
  Administered 2018-11-20: 20 mg via ORAL
  Filled 2018-11-19: qty 1

## 2018-11-19 MED ORDER — SODIUM CHLORIDE 0.9 % IV SOLN
1.0000 g | Freq: Once | INTRAVENOUS | Status: AC
  Administered 2018-11-19: 18:00:00 1 g via INTRAVENOUS
  Filled 2018-11-19: qty 1

## 2018-11-19 MED ORDER — ACETYLCYSTEINE 20 % IN SOLN
4.0000 mL | Freq: Once | RESPIRATORY_TRACT | Status: AC
  Administered 2018-11-19: 18:00:00 4 mL via RESPIRATORY_TRACT
  Filled 2018-11-19: qty 4

## 2018-11-19 MED ORDER — VITAMIN C 500 MG PO TABS
1000.0000 mg | ORAL_TABLET | Freq: Every day | ORAL | Status: DC
  Administered 2018-11-20: 1000 mg via ORAL
  Filled 2018-11-19: qty 2

## 2018-11-19 MED ORDER — SENNA 8.6 MG PO TABS
1.0000 | ORAL_TABLET | Freq: Two times a day (BID) | ORAL | Status: DC
  Administered 2018-11-20: 8.6 mg via ORAL
  Filled 2018-11-19 (×2): qty 1

## 2018-11-19 MED ORDER — ENSURE ENLIVE PO LIQD
237.0000 mL | Freq: Three times a day (TID) | ORAL | Status: DC
  Administered 2018-11-19 – 2018-11-20 (×2): 237 mL via ORAL

## 2018-11-19 MED ORDER — PANTOPRAZOLE SODIUM 40 MG PO TBEC
40.0000 mg | DELAYED_RELEASE_TABLET | Freq: Every day | ORAL | Status: DC
  Administered 2018-11-20: 40 mg via ORAL
  Filled 2018-11-19: qty 1

## 2018-11-19 MED ORDER — GUAIFENESIN ER 600 MG PO TB12: 600.0000 mg | ORAL_TABLET | Freq: Every day | ORAL | Status: DC | PRN

## 2018-11-19 MED ORDER — ACETYLCYSTEINE 20 % IN SOLN
4.0000 mL | Freq: Four times a day (QID) | RESPIRATORY_TRACT | Status: DC
  Administered 2018-11-20 (×3): 4 mL via RESPIRATORY_TRACT
  Filled 2018-11-19 (×5): qty 4

## 2018-11-19 MED ORDER — IPRATROPIUM-ALBUTEROL 0.5-2.5 (3) MG/3ML IN SOLN: 3.0000 mL | Freq: Four times a day (QID) | RESPIRATORY_TRACT | Status: DC

## 2018-11-19 MED ORDER — RISAQUAD PO CAPS
1.0000 | ORAL_CAPSULE | Freq: Three times a day (TID) | ORAL | Status: DC
  Administered 2018-11-20: 1 via ORAL
  Filled 2018-11-19 (×2): qty 1

## 2018-11-19 MED ORDER — VANCOMYCIN HCL IN DEXTROSE 1-5 GM/200ML-% IV SOLN
1000.0000 mg | Freq: Once | INTRAVENOUS | Status: AC
  Administered 2018-11-19: 17:00:00 1000 mg via INTRAVENOUS
  Filled 2018-11-19: qty 200

## 2018-11-19 MED ORDER — ACETYLCYSTEINE 20 % IN SOLN
4.0000 mL | Freq: Four times a day (QID) | RESPIRATORY_TRACT | Status: DC
  Filled 2018-11-19 (×2): qty 4

## 2018-11-19 MED ORDER — ALBUTEROL SULFATE (2.5 MG/3ML) 0.083% IN NEBU: 2.5000 mg | INHALATION_SOLUTION | RESPIRATORY_TRACT | Status: DC | PRN

## 2018-11-19 MED ORDER — ACETAMINOPHEN 325 MG PO TABS: 650.0000 mg | ORAL_TABLET | Freq: Four times a day (QID) | ORAL | Status: DC | PRN

## 2018-11-19 MED ORDER — POLYETHYLENE GLYCOL 3350 17 G PO PACK: 17.0000 g | PACK | Freq: Every day | ORAL | Status: DC | PRN

## 2018-11-19 MED ORDER — ACETAMINOPHEN 500 MG PO TABS
1000.0000 mg | ORAL_TABLET | Freq: Once | ORAL | Status: AC
  Administered 2018-11-19: 17:00:00 1000 mg via ORAL
  Filled 2018-11-19: qty 2

## 2018-11-19 MED ORDER — LACTINEX PO CHEW
1.0000 | CHEWABLE_TABLET | Freq: Three times a day (TID) | ORAL | Status: DC
  Filled 2018-11-19: qty 1

## 2018-11-19 MED ORDER — ALBUTEROL SULFATE (2.5 MG/3ML) 0.083% IN NEBU: 2.5000 mg | INHALATION_SOLUTION | Freq: Four times a day (QID) | RESPIRATORY_TRACT | Status: DC

## 2018-11-19 MED ORDER — POTASSIUM CHLORIDE CRYS ER 20 MEQ PO TBCR
20.0000 meq | EXTENDED_RELEASE_TABLET | Freq: Every day | ORAL | Status: DC
  Administered 2018-11-20: 20 meq via ORAL
  Filled 2018-11-19: qty 1

## 2018-11-19 MED ORDER — LEVOTHYROXINE SODIUM 25 MCG PO TABS
25.0000 ug | ORAL_TABLET | Freq: Every day | ORAL | Status: DC
  Administered 2018-11-20: 25 ug via ORAL
  Filled 2018-11-19: qty 1

## 2018-11-19 MED ORDER — SODIUM CHLORIDE 0.9 % IV SOLN
2.0000 g | INTRAVENOUS | Status: DC
  Filled 2018-11-19: qty 2

## 2018-11-19 MED ORDER — VANCOMYCIN HCL IN DEXTROSE 750-5 MG/150ML-% IV SOLN: 750.0000 mg | INTRAVENOUS | Status: DC

## 2018-11-19 MED ORDER — ALBUTEROL SULFATE HFA 108 (90 BASE) MCG/ACT IN AERS: 1.0000 | INHALATION_SPRAY | Freq: Four times a day (QID) | RESPIRATORY_TRACT | Status: DC | PRN

## 2018-11-19 MED ORDER — AMIODARONE HCL 200 MG PO TABS
400.0000 mg | ORAL_TABLET | Freq: Every day | ORAL | Status: DC
  Administered 2018-11-20: 400 mg via ORAL
  Filled 2018-11-19: qty 2

## 2018-11-19 MED ORDER — ALPRAZOLAM 0.5 MG PO TABS
0.5000 mg | ORAL_TABLET | Freq: Three times a day (TID) | ORAL | Status: DC | PRN
  Administered 2018-11-19: 0.5 mg via ORAL
  Filled 2018-11-19 (×2): qty 1

## 2018-11-19 NOTE — ED Notes (Signed)
ED TO INPATIENT HANDOFF REPORT  ED Nurse Name and Phone #: Gwynn Burly Name/Age/Gender Anna Mcbride 73 y.o. female Room/Bed: ED11A/ED11A  Code Status   Code Status: Prior  Home/SNF/Other Home Patient oriented to: self, place and time Is this baseline? Yes   Triage Complete: Triage complete  Chief Complaint SOB  Triage Note Pt presents to ED via [facility] with c/o respiratory distress. Per EMS pt with 100.1 axl. EMS reports 90% on chronic 3L, 100% on NRB. EMS reports BP 90/52. EMS reports pt D/C yesterday for pneumonia from [facility]. Per EMS pt with 20g to L AC, approx 100cc's NS given PTA.  CBG: 131 97/52 30 Co2 90 on 3L (normally on 2L) -> 100% on NRB   Allergies Allergies  Allergen Reactions  . Hydrocodone-Homatropine Itching  . Tussionex Pennkinetic Er [Hydrocod Polst-Cpm Polst Er] Itching  . Ciprofloxacin Rash    Level of Care/Admitting Diagnosis ED Disposition    ED Disposition Condition [facility] [facility] Area: [place] [100120]  Level of Care: Telemetry [5]  Covid Evaluation: N/A  Diagnosis: Sepsis [facility]) [[id number]]  Admitting Physician: Odessa Fleming  Attending Physician: Odessa Fleming  Estimated length of stay: past midnight tomorrow  Certification:: I certify this patient will need inpatient services for at least 2 midnights  PT Class (Do Not Modify): Inpatient [101]  PT Acc Code (Do Not Modify): Private [1]       B Medical/Surgery History Past Medical History:  Diagnosis Date  . Acquired hypothyroidism 07/01/2013  . Acute respiratory failure ([place]) 11/29/2014  . 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 Asthma   . Back pain with sciatica 05/07/2017  . Biliary sludge 12/19/2017  . Lymphoma, non-Hodgkin's ([place]) 06/30/2013  . Non Hodgkin's lymphoma ([place]) [id number]  . Personal history of chemotherapy   . Recurrent upper respiratory infection (URI) 09/01/2015  . 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 NASAL SINUS SURGERY  08/07/05 09/09/05   x 2   . VIDEO BRONCHOSCOPY Bilateral 11/30/2014   Procedure: VIDEO BRONCHOSCOPY WITHOUT FLUORO;  Surgeon: Flora Lipps, MD;  Location: [facility] ORS;  Service: Cardiopulmonary;  Laterality: Bilateral;     A IV Location/Drains/Wounds Patient Lines/Drains/Airways Status   Active Line/Drains/Airways    Name:   Placement date:   Placement time:   Site:   Days:   Implanted Port Left Chest   -    -    Chest      Implanted Port Left Chest   -    -    Chest      Peripheral IV 11/19/18 Left Antecubital   11/19/18    1555    Antecubital   less than 1   Peripheral IV 11/19/18 Right Hand   11/19/18    1629    Hand   less than 1   Peripheral IV 11/19/18 Right Wrist   11/19/18    1630    Wrist   less than 1   External Urinary Catheter   10/29/18    2100    -   21   External Urinary Catheter   11/19/18    1726    -   less than 1          Intake/Output Last 24 hours  Intake/Output Summary (Last 24 hours) at 11/19/2018 1941 Last data filed at 11/19/2018 1500 Gross per  24 hour  Intake 100 ml  Output -  Net 100 ml    Labs/Imaging Results for orders placed or performed during the hospital encounter of 11/19/18 (from the past 48 hour(s))  Comprehensive metabolic panel     Status: Abnormal   Collection Time: 11/19/18  4:09 PM  Result Value Ref Range   Sodium 139 135 - 145 mmol/L   Potassium 3.8 3.5 - 5.1 mmol/L   Chloride 100 98 - 111 mmol/L   CO2 28 22 - 32 mmol/L   Glucose, Bld 114 (H) 70 - 99 mg/dL   BUN 31 (H) 8 - 23 mg/dL   Creatinine, Ser 1.57 (H) 0.44 - 1.00 mg/dL   Calcium 9.2 8.9 - 10.3 mg/dL   Total Protein 7.0 6.5 - 8.1 g/dL   Albumin 3.1 (L) 3.5 - 5.0 g/dL   AST 21 15 - 41 U/L   ALT 20 0 - 44 U/L   Alkaline Phosphatase 83 38 - 126 U/L   Total Bilirubin 0.9 0.3 - 1.2 mg/dL   GFR calc non Af Amer 32 (L) >60 mL/min   GFR calc Af Amer 37 (L) >60 mL/min   Anion  gap 11 5 - 15    Comment: Performed at [facility], [address] [place], [place] [address]  Lactic acid, plasma     Status: None   Collection Time: 11/19/18  4:09 PM  Result Value Ref Range   Lactic Acid, Venous 1.5 0.5 - 1.9 mmol/L    Comment: Performed at [facility], [address] [place], [place] [address]  CBC with Differential     Status: Abnormal   Collection Time: 11/19/18  4:09 PM  Result Value Ref Range   WBC 10.2 4.0 - 10.5 K/uL   RBC 3.03 (L) 3.87 - 5.11 MIL/uL   Hemoglobin 9.6 (L) 12.0 - 15.0 g/dL   HCT 30.9 (L) 36.0 - 46.0 %   MCV 102.0 (H) 80.0 - 100.0 fL   MCH 31.7 26.0 - 34.0 pg   MCHC 31.1 30.0 - 36.0 g/dL   RDW 14.0 11.5 - 15.5 %   Platelets 182 150 - 400 K/uL   nRBC 0.0 0.0 - 0.2 %   Neutrophils Relative % 86 %   Neutro Abs 8.8 (H) 1.7 - 7.7 K/uL   Lymphocytes Relative 5 %   Lymphs Abs 0.5 (L) 0.7 - 4.0 K/uL   Monocytes Relative 7 %   Monocytes Absolute 0.7 0.1 - 1.0 K/uL   Eosinophils Relative 1 %   Eosinophils Absolute 0.1 0.0 - 0.5 K/uL   Basophils Relative 0 %   Basophils Absolute 0.0 0.0 - 0.1 K/uL   Immature Granulocytes 1 %   Abs Immature Granulocytes 0.06 0.00 - 0.07 K/uL    Comment: Performed at [facility], [address] [place], [place] [address]  Protime-INR     Status: Abnormal   Collection Time: 11/19/18  4:09 PM  Result Value Ref Range   Prothrombin Time 15.9 (H) 11.4 - 15.2 seconds   INR 1.3 (H) 0.8 - 1.2    Comment: (NOTE) INR goal varies based on device and disease states. Performed at [facility], [address] [place],  [address]   SARS Coronavirus 2 [facility] order, Performed in [facility] hospital lab)     Status: None   Collection Time: 11/19/18  4:09 PM  Result Value Ref Range   SARS Coronavirus 2 NEGATIVE NEGATIVE    Comment: (NOTE) If  result is NEGATIVE SARS-CoV-2 target nucleic acids are NOT DETECTED. The SARS-CoV-2 RNA is generally detectable in upper and  lower  respiratory specimens during the acute phase of infection. The lowest  concentration of SARS-CoV-2 viral copies this assay can detect is 250  copies / mL. A negative result does not preclude SARS-CoV-2 infection  and should not be used as the sole basis for treatment or other  patient management decisions.  A negative result may occur with  improper specimen collection / handling, submission of specimen other  than nasopharyngeal swab, presence of viral mutation(s) within the  areas targeted by this assay, and inadequate number of viral copies  (<250 copies / mL). A negative result must be combined with clinical  observations, patient history, and epidemiological information. If result is POSITIVE SARS-CoV-2 target nucleic acids are DETECTED. The SARS-CoV-2 RNA is generally detectable in upper and lower  respiratory specimens dur ing the acute phase of infection.  Positive  results are indicative of active infection with SARS-CoV-2.  Clinical  correlation with patient history and other diagnostic information is  necessary to determine patient infection status.  Positive results do  not rule out bacterial infection or co-infection with other viruses. If result is PRESUMPTIVE POSTIVE SARS-CoV-2 nucleic acids MAY BE PRESENT.   A presumptive positive result was obtained on the submitted specimen  and confirmed on repeat testing.  While 2019 novel coronavirus  (SARS-CoV-2) nucleic acids may be present in the submitted sample  additional confirmatory testing may be necessary for epidemiological  and / or clinical management purposes  to differentiate between  SARS-CoV-2 and other Sarbecovirus currently known to infect humans.  If clinically indicated additional testing with an alternate test  methodology [phone]) is advised. The SARS-CoV-2 RNA is generally  detectable in upper and lower respiratory sp ecimens during the acute  phase of infection. The expected result is  Negative. Fact Sheet for Patients:  StrictlyIdeas.no Fact Sheet for Healthcare Providers: BankingDealers.co.za This test is not yet approved or cleared by the [place] FDA and has been authorized for detection and/or diagnosis of SARS-CoV-2 by FDA under an Emergency Use Authorization (EUA).  This EUA will remain in effect (meaning this test can be used) for the duration of the COVID-19 declaration under Section 564(b)(1) of the Act, 21 U.S.C. section 360bbb-3(b)(1), unless the authorization is terminated or revoked sooner. Performed at [facility], [address] [place], [place] [address]    Dg Chest Portable 1 View  Result Date: 11/19/2018 CLINICAL DATA:  Shortness of breath, sepsis, respiratory distress, history non-Hodgkin's lymphoma, asthma EXAM: PORTABLE CHEST 1 VIEW COMPARISON:  Portable exam 1623 hours compared to 11/17/2018 FINDINGS: Stable LEFT subclavian Port-A-Cath. Normal heart size and mediastinal contours. Slight rotation to the RIGHT. Persistent diffuse BILATERAL pulmonary infiltrates especially RIGHT mid lung and RIGHT base. Loculated pleural effusion at RIGHT apex and RIGHT base unchanged. No pneumothorax. Bones demineralized. IMPRESSION: Persistent BILATERAL pulmonary infiltrates greater on RIGHT with loculated RIGHT pleural effusion. No significant interval change. Electronically Signed   By: Lavonia Dana M.D.   On: 11/19/2018 16:49    Pending Labs Unresulted Labs (From admission, onward)    Start     Ordered   11/19/18 1626  Lactic acid, plasma  Now then every 2 hours,   STAT     11/19/18 1625   11/19/18 1626  Culture, blood (Routine x 2)  BLOOD CULTURE X 2,   STAT     11/19/18 1625   11/19/18 1626  Urinalysis, Complete w Microscopic  ONCE - STAT,   STAT     11/19/18 1625   Signed and Held  CBC  (enoxaparin (LOVENOX)    CrCl >/= 30 ml/min)  Once,   R    Comments:  Baseline for enoxaparin therapy IF NOT  ALREADY DRAWN.  Notify MD if PLT < 100 K.    Signed and Held   Signed and Held  Creatinine, serum  (enoxaparin (LOVENOX)    CrCl >/= 30 ml/min)  Once,   R    Comments:  Baseline for enoxaparin therapy IF NOT ALREADY DRAWN.    Signed and Held   Signed and Held  Creatinine, serum  (enoxaparin (LOVENOX)    CrCl >/= 30 ml/min)  Weekly,   R    Comments:  while on enoxaparin therapy    Signed and Held          Vitals/Pain Today's Vitals   11/19/18 1815 11/19/18 1830 11/19/18 1845 11/19/18 1850  BP: (!) 89/51 (!) 86/50 (!) 90/56   Pulse: (!) 25 74 78   Resp: (!) 25 (!) 23 (!) 24   Temp:      TempSrc:      SpO2: 100% 100% 100%   Weight:      Height:      PainSc:    Asleep    Isolation Precautions No active isolations  Medications Medications  sodium chloride 0.9 % bolus 1,000 mL (has no administration in time range)  ceFEPIme (MAXIPIME) 1 g in sodium chloride 0.9 % 100 mL IVPB (1 g Intravenous New Bag/Given 11/19/18 1927)  ceFEPIme (MAXIPIME) 2 g in sodium chloride 0.9 % 100 mL IVPB (has no administration in time range)  vancomycin (VANCOCIN) IVPB 750 mg/150 ml premix (has no administration in time range)  sodium chloride flush (NS) 0.9 % injection 3 mL (3 mLs Intravenous Given 11/19/18 1630)  acetaminophen (TYLENOL) tablet 1,000 mg (1,000 mg Oral Given 11/19/18 1722)  ceFEPIme (MAXIPIME) 1 g in sodium chloride 0.9 % 100 mL IVPB (0 g Intravenous Stopped 11/19/18 1805)  vancomycin (VANCOCIN) IVPB 1000 mg/200 mL premix (0 mg Intravenous Stopped 11/19/18 1829)  ipratropium-albuterol (DUONEB) 0.5-2.5 (3) MG/3ML nebulizer solution 3 mL (3 mLs Nebulization Given 11/19/18 1825)  acetylcysteine (MUCOMYST) 20 % nebulizer / oral solution 4 mL (4 mLs Nebulization Given 11/19/18 1825)  0.9 %  sodium chloride infusion ( Intravenous New Bag/Given 11/19/18 1746)    Mobility walks High fall risk   Focused Assessments    R Recommendations: See Admitting Provider Note  Report given to:    Additional Notes: none

## 2018-11-19 NOTE — H&P (Signed)
[place] [facility] [place] NAME: Anna Mcbride    MR#:  [id number]  DATE OF BIRTH:  01/16/1946  DATE OF ADMISSION:  11/19/2018  PRIMARY CARE PHYSICIAN: Baxter Hire, MD   REQUESTING/REFERRING PHYSICIAN: Dr. Archie Balboa  CHIEF COMPLAINT:   I could not breathe HISTORY OF PRESENT ILLNESS:  Anna Mcbride  is a 73 y.o. 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 He had CT chest done on 26 April. Has significant changes on her chest CT. She has been followed by pulmonary.  She received IV vancomycin and cefepime. pt is being admitted for further evaluation management.  PAST MEDICAL HISTORY:   Past Medical History:  Diagnosis Date  . Acquired hypothyroidism 07/01/2013  . Acute respiratory failure ([place]) 11/29/2014  . 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 Asthma   . Back pain with sciatica 05/07/2017  . Biliary sludge 12/19/2017  . Lymphoma, non-Hodgkin's ([place]) 06/30/2013  . Non Hodgkin's lymphoma ([place]) [id number]  . Personal history of chemotherapy   . Recurrent upper respiratory infection (URI) 09/01/2015  . 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 NASAL SINUS SURGERY  08/07/05 09/09/05   x 2   . VIDEO BRONCHOSCOPY Bilateral 11/30/2014   Procedure: VIDEO BRONCHOSCOPY WITHOUT FLUORO;  Surgeon: Flora Lipps, MD;  Location: [facility] ORS;  Service: Cardiopulmonary;  Laterality: Bilateral;    SOCIAL HISTORY:   Social History   Tobacco Use  . Smoking status: Never Smoker  .  Smokeless tobacco: Never Used  Substance Use Topics  . Alcohol use: Not Currently    Frequency: Never    Comment: a couple glasses of wine a year    FAMILY HISTORY:   Family History  Problem Relation Age of Onset  . Asthma Son   . Congestive Heart Failure Mother   . Diabetes Brother   . Pulmonary embolism Father   . Breast cancer Neg Hx     DRUG ALLERGIES:   Allergies  Allergen Reactions  . Hydrocodone-Homatropine Itching  . Tussionex Pennkinetic Er [Hydrocod Polst-Cpm Polst Er] Itching  . Ciprofloxacin Rash    REVIEW OF SYSTEMS:  Review of Systems  Constitutional: Positive for fever. Negative for chills and weight loss.  HENT: Negative for ear discharge, ear pain and nosebleeds.   Eyes: Negative for blurred vision, pain and discharge.  Respiratory: Positive for cough, shortness of breath and wheezing. Negative for sputum production and stridor.   Cardiovascular: Negative for chest pain, palpitations, orthopnea and PND.  Gastrointestinal: Negative for abdominal pain, diarrhea, nausea and vomiting.  Genitourinary: Negative for frequency and urgency.  Musculoskeletal: Negative for back pain and joint pain.  Neurological: Positive for weakness. Negative for sensory change, speech change and focal weakness.  Psychiatric/Behavioral: Negative for depression and hallucinations. The patient is not nervous/anxious.      MEDICATIONS AT HOME:   Prior to Admission medications   Medication Sig Start Date End Date Taking? Authorizing Provider  acetylcysteine (MUCOMYST) 20 % nebulizer solution Take 4 mLs by nebulization every 6 (six) hours.    Yes [provider]  ALPRAZolam (XANAX) 0.5 MG tablet Take 1 tablet (0.5 mg total) by mouth at bedtime. Patient taking differently: Take 0.5 mg by mouth 3 (three) times daily as needed for anxiety.  01/08/18  Yes Gouru, Illene Silver, MD  amiodarone (PACERONE) 400 MG tablet Take 1 tablet (400 mg total) by mouth daily. 10/31/18  Yes Salary,  Avel Peace, MD  citalopram (CELEXA) 20 MG tablet TAKE 1 TABLET BY MOUTH ONCE DAILY Patient taking differently: Take 20 mg by mouth daily.  12/16/17  Yes Cammie Sickle, MD  ipratropium-albuterol (DUONEB) 0.5-2.5 (3) MG/3ML SOLN Take 3 mLs by nebulization every 6 (six) hours. 01/08/18  Yes Gouru, Illene Silver, MD  levofloxacin (LEVAQUIN) 250 MG tablet Take 1 tablet (250 mg total) by mouth daily. 11/18/18  Yes Sudini, Alveta Heimlich, MD  levothyroxine (SYNTHROID, LEVOTHROID) 25 MCG tablet TAKE 1 TABLET BY MOUTH ONCE DAILY ON AN EMPTY STOMACH. WAIT 30 MINUTES BEFORE TAKING OTHER MEDS. Patient taking differently: Take 25 mcg by mouth daily.  12/03/17  Yes Cammie Sickle, MD  Multiple Vitamin (MULTIVITAMIN WITH MINERALS) TABS tablet Take 1 tablet by mouth daily. 01/09/18  Yes Gouru, Aruna, MD  omeprazole (PRILOSEC) 20 MG capsule TAKE 1 CAPSULE BY MOUTH TWICE DAILY BEFORE MEALS Patient taking differently: Take 20 mg by mouth daily.  07/07/18  Yes Cammie Sickle, MD  potassium chloride SA (K-DUR,KLOR-CON) 20 MEQ tablet Take 1 tablet (20 mEq total) by mouth daily. 10/27/17  Yes Cammie Sickle, MD  predniSONE (DELTASONE) 10 MG tablet Take 2.5 tablets by mouth daily.    Yes [provider]  vitamin C (ASCORBIC ACID) 500 MG tablet Take 1,000 mg by mouth daily.    Yes [provider]  acetaminophen (TYLENOL) 325 MG tablet Take 2 tablets (650 mg total) by mouth every 6 (six) hours as needed for mild pain (headache). 01/08/18   Gouru, Illene Silver, MD  albuterol (PROVENTIL HFA;VENTOLIN HFA) 108 (90 BASE) MCG/ACT inhaler Inhale 1 puff into the lungs every 6 (six) hours as needed for wheezing or shortness of breath.    [provider]  feeding supplement, ENSURE ENLIVE, (ENSURE ENLIVE) LIQD Take 237 mLs by mouth 3 (three) times daily between meals. 01/08/18   Gouru, Illene Silver, MD  guaiFENesin (MUCINEX) 600 MG 12 hr tablet Take 600 mg by mouth daily.     [provider]  lactobacillus  acidophilus & bulgar (LACTINEX) chewable tablet Chew 1 tablet by mouth 3 (three) times daily with meals. 10/31/18   Salary, Avel Peace, MD  Respiratory Therapy Supplies (FLUTTER) DEVI Use 10-15 times daily 07/29/16   Flora Lipps, MD      VITAL SIGNS:  Blood pressure (!) 90/56, pulse 78, temperature (!) 102.5 F (39.2 C), temperature source Rectal, resp. rate (!) 24, height 5\' 3"  (1.6 m), weight 53 kg, SpO2 100 %.  PHYSICAL EXAMINATION:  GENERAL:  73 y.o.-year-old patient lying in the bed with mild to moderate acute distress. Cachectic thin chronically ill EYES: Pupils equal, round, reactive to light and accommodation. No scleral icterus.  HEENT: Head atraumatic, normocephalic. Oropharynx and nasopharynx clear. Oral mucosa dry NECK:  Supple, no jugular venous distention. No thyroid enlargement, no tenderness.  LUNGS: distant breath sounds bilaterally, no wheezing, rales,rhonchi or crepitation. Minimal use of accessory muscles of respiration.  CARDIOVASCULAR: S1, S2 normal. No murmurs, rubs, or gallops. Tachycardia ABDOMEN: Soft, nontender, nondistended. Bowel sounds present. No organomegaly or mass.  EXTREMITIES: No pedal edema, cyanosis, or clubbing.  NEUROLOGIC: Cranial nerves II through XII  are intact. Muscle strength 5/5 in all extremities. Sensation intact. Gait not checked. Unable to assess at length moves all extremities well. Nonfocal. PSYCHIATRIC: The patient is alert and oriented x 3.  SKIN: No obvious rash, lesion, or ulcer.   LABORATORY PANEL:   CBC Recent Labs  Lab 11/19/18 1609  WBC 10.2  HGB 9.6*  HCT 30.9*  PLT 182   ------------------------------------------------------------------------------------------------------------------  Chemistries  Recent Labs  Lab 11/19/18 1609  NA 139  K 3.8  CL 100  CO2 28  GLUCOSE 114*  BUN 31*  CREATININE 1.57*  CALCIUM 9.2  AST 21  ALT 20  ALKPHOS 83  BILITOT 0.9    ------------------------------------------------------------------------------------------------------------------  Cardiac Enzymes No results for input(s): TROPONINI in the last 168 hours. ------------------------------------------------------------------------------------------------------------------  RADIOLOGY:  Dg Chest Portable 1 View  Result Date: 11/19/2018 CLINICAL DATA:  Shortness of breath, sepsis, respiratory distress, history non-Hodgkin's lymphoma, asthma EXAM: PORTABLE CHEST 1 VIEW COMPARISON:  Portable exam 1623 hours compared to 11/17/2018 FINDINGS: Stable LEFT subclavian Port-A-Cath. Normal heart size and mediastinal contours. Slight rotation to the RIGHT. Persistent diffuse BILATERAL pulmonary infiltrates especially RIGHT mid lung and RIGHT base. Loculated pleural effusion at RIGHT apex and RIGHT base unchanged. No pneumothorax. Bones demineralized. IMPRESSION: Persistent BILATERAL pulmonary infiltrates greater on RIGHT with loculated RIGHT pleural effusion. No significant interval change. Electronically Signed   By: Lavonia Dana M.D.   On: 11/19/2018 16:49    EKG:    IMPRESSION AND PLAN:   Farzana Koci  is a 73 y.o. 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  1. Acute on chronic hypoxic respiratory failure likely HCAP with possible pulmonary edema with exacerbation of underlying severe radiation fibrosis. -Admit to medical floor -continue oxygen via nasal cannula -continue IV vancomycin and cefepime-- de-escalate antibiotics when appropriate -patient is M RSA PCR negative tested on eight April -pulmonary consultation if  needed -continue duo nebs -I will give Solu-Medrol 60 mg daily -Palliative care consultation.  2. Protein calorie malnutrition -continue ensure  3. Sepsis due to pneumonia as above  4. Non-Hodgkin's lymphoma in remission  5. DVT prophylaxis subcu Lovenox  All the records are reviewed and case discussed with ED provider.   CODE STATUS: full code  TOTAL TIME TAKING CARE OF THIS PATIENT: **50* minutes.    Fritzi Mandes M.D on 11/19/2018 at 6:51 PM  Between 7am to 6pm - Pager - [phone]  After 6pm go to [email or web address] - password [email or web address] [facility]  [facility]  Office  [phone]  CC: Primary care physician; Baxter Hire, MD

## 2018-11-19 NOTE — Progress Notes (Signed)
Pharmacy Antibiotic Note  Anna Mcbride is a 73 y.o. female admitted on 11/19/2018 with pneumonia.  Pharmacy has been consulted for cefepime, Vancomycin dosing.  Plan: Cefepime 1 gm IV X 1 given in ED on 4/30 @ 1735. Cefepime 1 gm IV X 1 ordered to follow initial 1 gm dose to make total starting dose of 2 gm. Cefepime 2 gm IV Q24H ordered to continue on 5/1 @ 1700.  Vancomycin 1 gm IV X 1 given in ED on 4/30 @ 1729. Vancomycin 750 mg IV Q36H ordered to start on 5/2 @ 0500. No peak or trough currently ordered.   CrCl = 26 ml/min Ke = 0.026 hr-1 T1/2 = 27 hrs Vd = 38.2 L   AUC = 510.7 Vanc trough = 13.3 hrs   Height: 5\' 3"  (160 cm) Weight: 116 lb 13.5 oz (53 kg) IBW/kg (Calculated) : 52.4  Temp (24hrs), Avg:102.5 F (39.2 C), Min:102.5 F (39.2 C), Max:102.5 F (39.2 C)  Recent Labs  Lab 11/15/18 0826 11/16/18 0536 11/17/18 0553 11/19/18 1609  WBC 8.8 8.3  --  10.2  CREATININE 1.72* 1.40* 1.27* 1.57*  LATICACIDVEN 1.0  --   --  1.5    Estimated Creatinine Clearance: 25.6 mL/min (A) (by C-G formula based on SCr of 1.57 mg/dL (H)).    Allergies  Allergen Reactions  . Hydrocodone-Homatropine Itching  . Tussionex Pennkinetic Er [Hydrocod Polst-Cpm Polst Er] Itching  . Ciprofloxacin Rash    Antimicrobials this admission:   >>    >>  Dose adjustments this admission:   Microbiology results:  BCx:   UCx:   Sputum:    MRSA PCR:   Thank you for allowing pharmacy to be a part of this patient's care.  Dalaina Tates D 11/19/2018 7:16 PM

## 2018-11-19 NOTE — Progress Notes (Signed)
Family Meeting Note Advance Directive:no  Today a meeting took place with the Patient.  Patient is able to participate.    The following clinical team members were present during this meeting:MD  The following were discussed:Patient's diagnosis: atypical pneumonia, Patient's progosis: Unable to determine and Goals for treatment: Full Code  Additional follow-up to be provided: palliative care consult  Time spent during discussion:20 minutes Fritzi Mandes, MD

## 2018-11-19 NOTE — ED Notes (Signed)
ED TO INPATIENT HANDOFF REPORT  ED Nurse Name and Phone #: Jennica Tagliaferri/John [phone]  S Name/Age/Gender Anna Mcbride 73 y.o. female Room/Bed: ED11A/ED11A  Code Status   Code Status: Prior  Home/SNF/Other Home Patient oriented to: self, place, time and situation Is this baseline? Yes   Triage Complete: Triage complete  Chief Complaint SOB  Triage Note Pt presents to ED via [facility] with c/o respiratory distress. Per EMS pt with 100.1 axl. EMS reports 90% on chronic 3L, 100% on NRB. EMS reports BP 90/52. EMS reports pt D/C yesterday for pneumonia from [facility]. Per EMS pt with 20g to L AC, approx 100cc's NS given PTA.  CBG: 131 97/52 30 Co2 90 on 3L (normally on 2L) -> 100% on NRB   Allergies Allergies  Allergen Reactions  . Hydrocodone-Homatropine Itching  . Tussionex Pennkinetic Er [Hydrocod Polst-Cpm Polst Er] Itching  . Ciprofloxacin Rash    Level of Care/Admitting Diagnosis ED Disposition    ED Disposition Condition [facility] Area: [place] [[id number]]  Level of Care: Telemetry [5]  Covid Evaluation: N/A  Diagnosis: Sepsis [facility]) [[id number]]  Admitting Physician: Odessa Fleming  Attending Physician: Odessa Fleming  Estimated length of stay: past midnight tomorrow  Certification:: I certify this patient will need inpatient services for at least 2 midnights  PT Class (Do Not Modify): Inpatient [101]  PT Acc Code (Do Not Modify): Private [1]       B Medical/Surgery History Past Medical History:  Diagnosis Date  . Acquired hypothyroidism 07/01/2013  . Acute respiratory failure ([place]) 11/29/2014  . 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 Asthma   . Back pain with sciatica 05/07/2017  . Biliary sludge 12/19/2017  . Lymphoma, non-Hodgkin's ([place]) 06/30/2013  . Non Hodgkin's lymphoma ([place]) [id number]  . Personal history of chemotherapy   . Recurrent upper respiratory infection (URI) 09/01/2015  . 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 NASAL SINUS SURGERY  08/07/05 09/09/05   x 2   . VIDEO BRONCHOSCOPY Bilateral 11/30/2014   Procedure: VIDEO BRONCHOSCOPY WITHOUT FLUORO;  Surgeon: Flora Lipps, MD;  Location: [facility] ORS;  Service: Cardiopulmonary;  Laterality: Bilateral;     A IV Location/Drains/Wounds Patient Lines/Drains/Airways Status   Active Line/Drains/Airways    Name:   Placement date:   Placement time:   Site:   Days:   Implanted Port Left Chest   -    -    Chest      Implanted Port Left Chest   -    -    Chest      Peripheral IV 11/19/18 Left Antecubital   11/19/18    1555    Antecubital   less than 1   Peripheral IV 11/19/18 Right Hand   11/19/18    1629    Hand   less than 1   Peripheral IV 11/19/18 Right Wrist   11/19/18    1630    Wrist   less than 1   External Urinary Catheter   10/29/18    2100    -   21   External Urinary Catheter   11/19/18    1726    -   less than 1          Intake/Output Last 24 hours  Intake/Output Summary (Last 24 hours) at 11/19/2018 1937 Last data filed at 11/19/2018 1500  Gross per 24 hour  Intake 100 ml  Output -  Net 100 ml    Labs/Imaging Results for orders placed or performed during the hospital encounter of 11/19/18 (from the past 48 hour(s))  Comprehensive metabolic panel     Status: Abnormal   Collection Time: 11/19/18  4:09 PM  Result Value Ref Range   Sodium 139 135 - 145 mmol/L   Potassium 3.8 3.5 - 5.1 mmol/L   Chloride 100 98 - 111 mmol/L   CO2 28 22 - 32 mmol/L   Glucose, Bld 114 (H) 70 - 99 mg/dL   BUN 31 (H) 8 - 23 mg/dL   Creatinine, Ser 1.57 (H) 0.44 - 1.00 mg/dL   Calcium 9.2 8.9 - 10.3 mg/dL   Total Protein 7.0 6.5 - 8.1 g/dL   Albumin 3.1 (L) 3.5 - 5.0 g/dL   AST 21 15 - 41 U/L   ALT 20 0 - 44 U/L   Alkaline Phosphatase 83 38 - 126 U/L   Total Bilirubin 0.9 0.3 - 1.2 mg/dL   GFR calc non Af Amer 32 (L) >60 mL/min   GFR calc Af Amer 37  (L) >60 mL/min   Anion gap 11 5 - 15    Comment: Performed at [facility], [address] [place], [place] [address]  Lactic acid, plasma     Status: None   Collection Time: 11/19/18  4:09 PM  Result Value Ref Range   Lactic Acid, Venous 1.5 0.5 - 1.9 mmol/L    Comment: Performed at [facility], [address] [place], [place] [address]  CBC with Differential     Status: Abnormal   Collection Time: 11/19/18  4:09 PM  Result Value Ref Range   WBC 10.2 4.0 - 10.5 K/uL   RBC 3.03 (L) 3.87 - 5.11 MIL/uL   Hemoglobin 9.6 (L) 12.0 - 15.0 g/dL   HCT 30.9 (L) 36.0 - 46.0 %   MCV 102.0 (H) 80.0 - 100.0 fL   MCH 31.7 26.0 - 34.0 pg   MCHC 31.1 30.0 - 36.0 g/dL   RDW 14.0 11.5 - 15.5 %   Platelets 182 150 - 400 K/uL   nRBC 0.0 0.0 - 0.2 %   Neutrophils Relative % 86 %   Neutro Abs 8.8 (H) 1.7 - 7.7 K/uL   Lymphocytes Relative 5 %   Lymphs Abs 0.5 (L) 0.7 - 4.0 K/uL   Monocytes Relative 7 %   Monocytes Absolute 0.7 0.1 - 1.0 K/uL   Eosinophils Relative 1 %   Eosinophils Absolute 0.1 0.0 - 0.5 K/uL   Basophils Relative 0 %   Basophils Absolute 0.0 0.0 - 0.1 K/uL   Immature Granulocytes 1 %   Abs Immature Granulocytes 0.06 0.00 - 0.07 K/uL    Comment: Performed at [facility], [address] [place], [place] [address]  Protime-INR     Status: Abnormal   Collection Time: 11/19/18  4:09 PM  Result Value Ref Range   Prothrombin Time 15.9 (H) 11.4 - 15.2 seconds   INR 1.3 (H) 0.8 - 1.2    Comment: (NOTE) INR goal varies based on device and disease states. Performed at [facility], [address] [place], [place] [address]   SARS Coronavirus 2 [facility] order, Performed in [facility] hospital lab)     Status: None   Collection Time: 11/19/18  4:09 PM  Result Value Ref Range   SARS Coronavirus 2 NEGATIVE NEGATIVE    Comment: (  NOTE) If result is NEGATIVE SARS-CoV-2 target nucleic acids are NOT DETECTED. The SARS-CoV-2 RNA is generally  detectable in upper and lower  respiratory specimens during the acute phase of infection. The lowest  concentration of SARS-CoV-2 viral copies this assay can detect is 250  copies / mL. A negative result does not preclude SARS-CoV-2 infection  and should not be used as the sole basis for treatment or other  patient management decisions.  A negative result may occur with  improper specimen collection / handling, submission of specimen other  than nasopharyngeal swab, presence of viral mutation(s) within the  areas targeted by this assay, and inadequate number of viral copies  (<250 copies / mL). A negative result must be combined with clinical  observations, patient history, and epidemiological information. If result is POSITIVE SARS-CoV-2 target nucleic acids are DETECTED. The SARS-CoV-2 RNA is generally detectable in upper and lower  respiratory specimens dur ing the acute phase of infection.  Positive  results are indicative of active infection with SARS-CoV-2.  Clinical  correlation with patient history and other diagnostic information is  necessary to determine patient infection status.  Positive results do  not rule out bacterial infection or co-infection with other viruses. If result is PRESUMPTIVE POSTIVE SARS-CoV-2 nucleic acids MAY BE PRESENT.   A presumptive positive result was obtained on the submitted specimen  and confirmed on repeat testing.  While 2019 novel coronavirus  (SARS-CoV-2) nucleic acids may be present in the submitted sample  additional confirmatory testing may be necessary for epidemiological  and / or clinical management purposes  to differentiate between  SARS-CoV-2 and other Sarbecovirus currently known to infect humans.  If clinically indicated additional testing with an alternate test  methodology [phone]) is advised. The SARS-CoV-2 RNA is generally  detectable in upper and lower respiratory sp ecimens during the acute  phase of infection. The  expected result is Negative. Fact Sheet for Patients:  StrictlyIdeas.no Fact Sheet for Healthcare Providers: BankingDealers.co.za This test is not yet approved or cleared by the [place] FDA and has been authorized for detection and/or diagnosis of SARS-CoV-2 by FDA under an Emergency Use Authorization (EUA).  This EUA will remain in effect (meaning this test can be used) for the duration of the COVID-19 declaration under Section 564(b)(1) of the Act, 21 U.S.C. section 360bbb-3(b)(1), unless the authorization is terminated or revoked sooner. Performed at [facility], [address] [place], [place] [address]    Dg Chest Portable 1 View  Result Date: 11/19/2018 CLINICAL DATA:  Shortness of breath, sepsis, respiratory distress, history non-Hodgkin's lymphoma, asthma EXAM: PORTABLE CHEST 1 VIEW COMPARISON:  Portable exam 1623 hours compared to 11/17/2018 FINDINGS: Stable LEFT subclavian Port-A-Cath. Normal heart size and mediastinal contours. Slight rotation to the RIGHT. Persistent diffuse BILATERAL pulmonary infiltrates especially RIGHT mid lung and RIGHT base. Loculated pleural effusion at RIGHT apex and RIGHT base unchanged. No pneumothorax. Bones demineralized. IMPRESSION: Persistent BILATERAL pulmonary infiltrates greater on RIGHT with loculated RIGHT pleural effusion. No significant interval change. Electronically Signed   By: Lavonia Dana M.D.   On: 11/19/2018 16:49    Pending Labs Unresulted Labs (From admission, onward)    Start     Ordered   11/19/18 1626  Lactic acid, plasma  Now then every 2 hours,   STAT     11/19/18 1625   11/19/18 1626  Culture, blood (Routine x 2)  BLOOD CULTURE X 2,   STAT     11/19/18 1625  11/19/18 1626  Urinalysis, Complete w Microscopic  ONCE - STAT,   STAT     11/19/18 1625   Signed and Held  CBC  (enoxaparin (LOVENOX)    CrCl >/= 30 ml/min)  Once,   R    Comments:  Baseline for  enoxaparin therapy IF NOT ALREADY DRAWN.  Notify MD if PLT < 100 K.    Signed and Held   Signed and Held  Creatinine, serum  (enoxaparin (LOVENOX)    CrCl >/= 30 ml/min)  Once,   R    Comments:  Baseline for enoxaparin therapy IF NOT ALREADY DRAWN.    Signed and Held   Signed and Held  Creatinine, serum  (enoxaparin (LOVENOX)    CrCl >/= 30 ml/min)  Weekly,   R    Comments:  while on enoxaparin therapy    Signed and Held          Vitals/Pain Today's Vitals   11/19/18 1815 11/19/18 1830 11/19/18 1845 11/19/18 1850  BP: (!) 89/51 (!) 86/50 (!) 90/56   Pulse: (!) 25 74 78   Resp: (!) 25 (!) 23 (!) 24   Temp:      TempSrc:      SpO2: 100% 100% 100%   Weight:      Height:      PainSc:    Asleep    Isolation Precautions No active isolations  Medications Medications  sodium chloride 0.9 % bolus 1,000 mL (has no administration in time range)  ceFEPIme (MAXIPIME) 1 g in sodium chloride 0.9 % 100 mL IVPB (1 g Intravenous New Bag/Given 11/19/18 1927)  ceFEPIme (MAXIPIME) 2 g in sodium chloride 0.9 % 100 mL IVPB (has no administration in time range)  vancomycin (VANCOCIN) IVPB 750 mg/150 ml premix (has no administration in time range)  sodium chloride flush (NS) 0.9 % injection 3 mL (3 mLs Intravenous Given 11/19/18 1630)  acetaminophen (TYLENOL) tablet 1,000 mg (1,000 mg Oral Given 11/19/18 1722)  ceFEPIme (MAXIPIME) 1 g in sodium chloride 0.9 % 100 mL IVPB (0 g Intravenous Stopped 11/19/18 1805)  vancomycin (VANCOCIN) IVPB 1000 mg/200 mL premix (0 mg Intravenous Stopped 11/19/18 1829)  ipratropium-albuterol (DUONEB) 0.5-2.5 (3) MG/3ML nebulizer solution 3 mL (3 mLs Nebulization Given 11/19/18 1825)  acetylcysteine (MUCOMYST) 20 % nebulizer / oral solution 4 mL (4 mLs Nebulization Given 11/19/18 1825)  0.9 %  sodium chloride infusion ( Intravenous New Bag/Given 11/19/18 1746)    Mobility non-ambulatory High fall risk   Focused Assessments Pulmonary Assessment Handoff:  Lung  sounds:   O2 Device: NRB        R Recommendations: See Admitting Provider Note  Report given to:   Additional Notes: Pt normally has home health to help w/ ADL's and care, wears 2L of O2 @ home, chronically. Ot discharged yesterday, returned today w/ worsening respiratory status and fever. COVID NEG(-)

## 2018-11-19 NOTE — Progress Notes (Signed)
Anticoagulation monitoring(Lovenox):  73 yo female ordered Lovenox 40 mg Q24h  Filed Weights   11/19/18 1557  Weight: 116 lb 13.5 oz (53 kg)   BMI    Lab Results  Component Value Date   CREATININE 1.57 (H) 11/19/2018   CREATININE 1.27 (H) 11/17/2018   CREATININE 1.40 (H) 11/16/2018   Estimated Creatinine Clearance: 25.6 mL/min (A) (by C-G formula based on SCr of 1.57 mg/dL (H)). Hemoglobin & Hematocrit     Component Value Date/Time   HGB 9.6 (L) 11/19/2018 1609   HGB 11.4 (L) 11/16/2014 1415   HCT 30.9 (L) 11/19/2018 1609   HCT 33.6 (L) 11/16/2014 1415     Per Protocol for Patient with estCrcl < 30 ml/min and BMI < 40, will transition to Lovenox 30 mg Q24h.

## 2018-11-19 NOTE — ED Provider Notes (Signed)
[facility] Emergency Department Provider Note   ____________________________________________   I have reviewed the triage vital signs and the nursing notes.   HISTORY  Chief Complaint Shortness of breath  History limited by: Not Limited   HPI Anna Mcbride is a 73 y.o. female who presents to the emergency department today because of concern for recurrent shortness of breath. The patient was discharged from the hospital yesterday after an admission for SOB and pneumonia. The patient states that she started developing shortness of breath again today. She did try her breathing treatments this morning without significant relief. Has had fevers. Did take dose of Levaquin this morning as well.   Records reviewed. Per medical record review patient has a history of recent admission for SOB, pneumonia, discharged yesterday.   Past Medical History:  Diagnosis Date  . Acquired hypothyroidism 07/01/2013  . Acute respiratory failure ([place]) 11/29/2014  . 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 Asthma   . Back pain with sciatica 05/07/2017  . Biliary sludge 12/19/2017  . Lymphoma, non-Hodgkin's ([place]) 06/30/2013  . Non Hodgkin's lymphoma ([place]) [id number]  . Personal history of chemotherapy   . Recurrent upper respiratory infection (URI) 09/01/2015  . Stenosis of subclavian artery ([place]) 11/29/2014    Patient Active Problem List   Diagnosis Date Noted  . Atypical pneumonia 11/15/2018  . Suspected Covid-19 Virus Infection 10/27/2018  . Malnutrition of moderate degree 01/08/2018  . Adjustment disorder with anxiety 01/06/2018  . Epigastric abdominal pain   . History of esophagitis 12/29/2017  . GERD (gastroesophageal reflux disease) 12/19/2017  . Biliary sludge 12/19/2017  . CKD (chronic kidney disease), stage III ([place]) 12/19/2017  . Chronic respiratory failure with hypoxia, on home O2 therapy ([place]) 12/16/2017  . Back pain with sciatica 05/07/2017  . Severe sepsis ([place])  09/12/2016  . Pneumothorax   . 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 Recurrent upper respiratory infection (URI) 09/01/2015  . Acute respiratory failure with hypoxia ([place])   . 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 SOB (shortness of breath)   . CAP (community acquired pneumonia)   . Acute respiratory failure ([place]) 11/29/2014  . History of digestive disease 11/29/2014  . HLD (hyperlipidemia) 11/29/2014  . Adult hypothyroidism 11/29/2014  . Lymphoma ([place]) 11/29/2014  . RAD (reactive airway disease) 11/29/2014  . Stenosis of subclavian artery ([place]) 11/29/2014  . Pain in shoulder 11/01/2014  . Fracture of humerus, proximal 11/01/2014  . Febrile neutropenia ([place]) 11/08/2013  . Decreased potassium in the blood 09/02/2013  . Lymphoma, non-Hodgkin's ([place]) 06/30/2013  . Cervical lymphadenopathy 04/20/2013  . History of lymphoma 04/20/2013  . Dyspnea 01/29/2011  . Cough 01/29/2011    Past Surgical History:  Procedure Laterality Date  . 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 NASAL SINUS SURGERY  08/07/05 09/09/05   x 2   . VIDEO BRONCHOSCOPY Bilateral 11/30/2014   Procedure: VIDEO BRONCHOSCOPY WITHOUT FLUORO;  Surgeon: Flora Lipps, MD;  Location: [facility] ORS;  Service: Cardiopulmonary;  Laterality: Bilateral;    Prior to Admission medications   Medication Sig Start Date End Date Taking? Authorizing Provider  acetaminophen (TYLENOL) 325 MG tablet Take 2 tablets (650 mg total) by mouth every 6 (six) hours as needed for mild pain (headache). 01/08/18   Gouru, Illene Silver, MD  acetylcysteine (MUCOMYST) 20 % nebulizer solution Take 4 mLs by nebulization every 6 (six) hours.     [provider]  albuterol (PROVENTIL HFA;VENTOLIN HFA) 108 (90  BASE) MCG/ACT inhaler Inhale 1 puff into the lungs every 6 (six) hours as needed for wheezing or shortness of breath.    [provider]  ALPRAZolam Duanne Moron) 0.5 MG tablet Take 1 tablet (0.5 mg total) by mouth at  bedtime. Patient taking differently: Take 0.5 mg by mouth 3 (three) times daily as needed for anxiety.  01/08/18   [name], MD  amiodarone (PACERONE) 400 MG tablet Take 1 tablet (400 mg total) by mouth daily. Patient taking differently: Take 200 mg by mouth daily.  10/31/18   Salary, Avel Peace, MD  citalopram (CELEXA) 20 MG tablet TAKE 1 TABLET BY MOUTH ONCE DAILY Patient taking differently: Take 20 mg by mouth daily.  12/16/17   Cammie Sickle, MD  feeding supplement, ENSURE ENLIVE, (ENSURE ENLIVE) LIQD Take 237 mLs by mouth 3 (three) times daily between meals. 01/08/18   [name], MD  furosemide (LASIX) 20 MG tablet Take 1 tablet (20 mg total) by mouth daily. Patient not taking: Reported on 11/15/2018 03/04/18   Flora Lipps, MD  guaiFENesin (MUCINEX) 600 MG 12 hr tablet Take 600 mg by mouth daily.     [provider]  ipratropium-albuterol (DUONEB) 0.5-2.5 (3) MG/3ML SOLN Take 3 mLs by nebulization every 6 (six) hours. 01/08/18   [name], MD  lactobacillus acidophilus & bulgar (LACTINEX) chewable tablet Chew 1 tablet by mouth 3 (three) times daily with meals. 10/31/18   Salary, Avel Peace, MD  levofloxacin (LEVAQUIN) 250 MG tablet Take 1 tablet (250 mg total) by mouth daily. 11/18/18   Sudini, Alveta Heimlich, MD  levothyroxine (SYNTHROID, LEVOTHROID) 25 MCG tablet TAKE 1 TABLET BY MOUTH ONCE DAILY ON AN EMPTY STOMACH. WAIT 30 MINUTES BEFORE TAKING OTHER MEDS. Patient taking differently: Take 25 mcg by mouth daily.  12/03/17   Cammie Sickle, MD  Multiple Vitamin (MULTIVITAMIN WITH MINERALS) TABS tablet Take 1 tablet by mouth daily. 01/09/18   Gouru, Illene Silver, MD  omeprazole (PRILOSEC) 20 MG capsule TAKE 1 CAPSULE BY MOUTH TWICE DAILY BEFORE MEALS Patient taking differently: Take 20 mg by mouth daily.  07/07/18   Cammie Sickle, MD  potassium chloride SA (K-DUR,KLOR-CON) 20 MEQ tablet Take 1 tablet (20 mEq total) by mouth daily. 10/27/17   Cammie Sickle, MD  predniSONE  (DELTASONE) 10 MG tablet Take 2.5 tablets by mouth daily.     [provider]  Respiratory Therapy Supplies (FLUTTER) DEVI Use 10-15 times daily 07/29/16   Flora Lipps, MD  vitamin C (ASCORBIC ACID) 500 MG tablet Take 1,000 mg by mouth daily.     [provider]    Allergies Hydrocodone-homatropine; Tussionex pennkinetic er [employer] polst-cpm polst er]; and Ciprofloxacin  Family History  Problem Relation Age of Onset  . Asthma Son   . Congestive Heart Failure Mother   . Diabetes Brother   . Pulmonary embolism Father   . Breast cancer Neg Hx     Social History Social History   Tobacco Use  . Smoking status: Never Smoker  . Smokeless tobacco: Never Used  Substance Use Topics  . Alcohol use: Not Currently    Frequency: Never    Comment: a couple glasses of wine a year  . Drug use: No    Review of Systems Constitutional: Positive for fever.  Eyes: No visual changes. ENT: No sore throat. Cardiovascular: Denies chest pain. Respiratory: Positive for shortness of breath. Gastrointestinal: No abdominal pain.  No nausea, no vomiting.  No diarrhea.   Genitourinary: Negative for dysuria.  Musculoskeletal: Negative for back pain. Skin: Negative for rash. Neurological: Negative for headaches, focal weakness or numbness.  ____________________________________________   PHYSICAL EXAM:  VITAL SIGNS: ED Triage Vitals  Enc Vitals Group     BP 11/19/18 1559 (!) 91/58     Pulse Rate 11/19/18 1559 85     Resp 11/19/18 1559 (!) 39     Temp 11/19/18 1559 (!) 102.5 F (39.2 C)     Temp Source 11/19/18 1559 Rectal     SpO2 11/19/18 1559 100 %     Weight 11/19/18 1557 116 lb 13.5 oz (53 kg)     Height 11/19/18 1557 5\' 3"  (1.6 m)     Head Circumference --      Peak Flow --      Pain Score 11/19/18 1556 0   Constitutional: Alert and oriented.  Eyes: Conjunctivae are normal.  ENT      Head: Normocephalic and atraumatic.      Nose: No congestion/rhinnorhea.       Mouth/Throat: Mucous membranes are moist.      Neck: No stridor. Hematological/Lymphatic/Immunilogical: No cervical lymphadenopathy. Cardiovascular: Normal rate, regular rhythm. Respiratory: Tachypnea. Increased respiratory effort. Diffuse expiratory wheezing.  Gastrointestinal: Soft and non tender. No rebound. No guarding.  Genitourinary: Deferred Musculoskeletal: Normal range of motion in all extremities. No lower extremity edema. Neurologic:  Normal speech and language. No gross focal neurologic deficits are appreciated.  Skin:  Skin is warm, dry and intact. No rash noted. Psychiatric: Mood and affect are normal. Speech and behavior are normal. Patient exhibits appropriate insight and judgment.  ____________________________________________    LABS (pertinent positives/negatives)  Coronavirus negative CMP wnl except glu 114, cr 1.57 INR 1.3 CBC wbc 10.2, hgb 9.6, plt 182  ____________________________________________   EKG  None  ____________________________________________    RADIOLOGY  CXR Persistent bilateral infiltrates. With right pleural effusion   ____________________________________________   PROCEDURES  Procedures  ____________________________________________   INITIAL IMPRESSION / ASSESSMENT AND PLAN / ED COURSE  Pertinent labs & imaging results that were available during my care of the patient were reviewed by me and considered in my medical decision making (see chart for details).   Patient presented to the emergency department today with recurrent shortness of breath. Had been discharged from the hospital yesterday after an admission for respiratory difficulty and pneumonia. The patient was febrile here in the emergency department. X-ray shows persistent infiltrates. COVID test negative. Will plan on admission for further management of breathing difficulty and infection.   ____________________________________________   FINAL CLINICAL  IMPRESSION(S) / ED DIAGNOSES  Final diagnoses:  Pneumonia due to infectious organism, unspecified laterality, unspecified part of lung  Fever, unspecified fever cause  Shortness of breath     Note: This dictation was prepared with Dragon dictation. Any transcriptional errors that result from this process are unintentional     Nance Pear, MD 11/19/18 1757

## 2018-11-19 NOTE — ED Triage Notes (Signed)
Pt presents to ED via [facility] with c/o respiratory distress. Per EMS pt with 100.1 axl. EMS reports 90% on chronic 3L, 100% on NRB. EMS reports BP 90/52. EMS reports pt D/C yesterday for pneumonia from [facility]. Per EMS pt with 20g to L AC, approx 100cc's NS given PTA.  CBG: 131 97/52 30 Co2 90 on 3L (normally on 2L) -> 100% on NRB

## 2018-11-19 NOTE — Plan of Care (Addendum)
  Problem: Clinical Measurements: Goal: Ability to maintain clinical measurements within normal limits will improve Outcome: Progressing Goal: Will remain free from infection Outcome: Progressing Goal: Respiratory complications will improve Outcome: Progressing   Problem: Activity: Goal: Risk for activity intolerance will decrease Outcome: Progressing   Problem: Nutrition: Goal: Adequate nutrition will be maintained Outcome: Progressing   Problem: Coping: Goal: Level of anxiety will decrease Outcome: Progressing   Problem: Skin Integrity: Goal: Risk for impaired skin integrity will decrease Outcome: Progressing

## 2018-11-20 ENCOUNTER — Encounter: Payer: Self-pay | Admitting: Primary Care

## 2018-11-20 ENCOUNTER — Inpatient Hospital Stay: Payer: Medicare Other

## 2018-11-20 DIAGNOSIS — J189 Pneumonia, unspecified organism: Secondary | ICD-10-CM

## 2018-11-20 DIAGNOSIS — Z7189 Other specified counseling: Secondary | ICD-10-CM

## 2018-11-20 DIAGNOSIS — Z515 Encounter for palliative care: Secondary | ICD-10-CM

## 2018-11-20 LAB — BODY FLUID CELL COUNT WITH DIFFERENTIAL
Eos, Fluid: 0 %
Lymphs, Fluid: 48 %
Monocyte-Macrophage-Serous Fluid: 37 %
Neutrophil Count, Fluid: 15 %
Total Nucleated Cell Count, Fluid: 103 cu mm

## 2018-11-20 LAB — GLUCOSE, PLEURAL OR PERITONEAL FLUID: Glucose, Fluid: 101 mg/dL

## 2018-11-20 LAB — CULTURE, BLOOD (ROUTINE X 2)
Culture: NO GROWTH
Culture: NO GROWTH
Special Requests: ADEQUATE
Special Requests: ADEQUATE

## 2018-11-20 LAB — LACTATE DEHYDROGENASE, PLEURAL OR PERITONEAL FLUID: LD, Fluid: 73 U/L — ABNORMAL HIGH (ref 3–23)

## 2018-11-20 LAB — ALBUMIN, PLEURAL OR PERITONEAL FLUID: Albumin, Fluid: <1 g/dL

## 2018-11-20 MED ORDER — MORPHINE SULFATE (CONCENTRATE) 10 MG/0.5ML PO SOLN: 5.0000 mg | ORAL | 0 refills | Status: AC | PRN

## 2018-11-20 MED ORDER — MORPHINE SULFATE (CONCENTRATE) 10 MG/0.5ML PO SOLN
5.0000 mg | ORAL | Status: DC | PRN
  Administered 2018-11-20: 5 mg via ORAL
  Filled 2018-11-20: qty 1

## 2018-11-20 MED ORDER — GLYCOPYRROLATE 1 MG PO TABS
1.0000 mg | ORAL_TABLET | Freq: Two times a day (BID) | ORAL | Status: DC
  Administered 2018-11-20: 1 mg via ORAL
  Filled 2018-11-20 (×3): qty 1

## 2018-11-20 MED ORDER — GLYCOPYRROLATE 1 MG PO TABS: 1.0000 mg | ORAL_TABLET | Freq: Two times a day (BID) | ORAL | 0 refills | Status: AC

## 2018-11-20 MED ORDER — MORPHINE SULFATE 20 MG/5ML PO SOLN: 2.5000 mg | ORAL | 0 refills | Status: DC | PRN

## 2018-11-20 NOTE — Discharge Instructions (Signed)
Shortness of Breath, Adult  Shortness of breath is when a person has trouble breathing enough air or when a person feels like she or he is having trouble breathing in enough air. Shortness of breath could be a sign of a medical problem.  Follow these instructions at home:     Pay attention to any changes in your symptoms.   Do not use any products that contain nicotine or tobacco, such as cigarettes, e-cigarettes, and chewing tobacco.   Do not smoke. Smoking is a common cause of shortness of breath. If you need help quitting, ask your health care provider.   Avoid things that can irritate your airways, such as:  ? Mold.  ? Dust.  ? Air pollution.  ? Chemical fumes.  ? Things that can cause allergy symptoms (allergens), if you have allergies.   Keep your living space clean and free of mold and dust.   Rest as needed. Slowly return to your usual activities.   Take over-the-counter and prescription medicines only as told by your health care provider. This includes oxygen therapy and inhaled medicines.   Keep all follow-up visits as told by your health care provider. This is important.  Contact a health care provider if:   Your condition does not improve as soon as expected.   You have a hard time doing your normal activities, even after you rest.   You have new symptoms.  Get help right away if:   Your shortness of breath gets worse.   You have shortness of breath when you are resting.   You feel light-headed or you faint.   You have a cough that is not controlled with medicines.   You cough up blood.   You have pain with breathing.   You have pain in your chest, arms, shoulders, or abdomen.   You have a fever.   You cannot walk up stairs or exercise the way that you normally do.  These symptoms may represent a serious problem that is an emergency. Do not wait to see if the symptoms will go away. Get medical help right away. Call your local emergency services (911 in the [place] Do not drive yourself  to the hospital.  Summary   Shortness of breath is when a person has trouble breathing enough air. It can be a sign of a medical problem.   Avoid things that irritate your lungs, such as smoking, pollution, mold, and dust.   Pay attention to changes in your symptoms and contact your health care provider if you have a hard time completing daily activities because of shortness of breath.  This information is not intended to replace advice given to you by your health care provider. Make sure you discuss any questions you have with your health care provider.  Document Released: 04/02/2001 Document Revised: 12/08/2017 Document Reviewed: 12/08/2017  Elsevier Interactive Patient Education  2019 [employer]

## 2018-11-20 NOTE — Progress Notes (Signed)
Family Meeting Note  Advance Directive:no  Today a meeting took place with the Patient. 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  Time spent during discussion:20 minutes  Bettey Costa, MD

## 2018-11-20 NOTE — Progress Notes (Signed)
New referral for [place] services at home received from [facility]. Patient is a 73 year old woman with a PMH significant for pulmonary fibrosis. She was admitted to [facility] from home with increased shortness of breath. She has had 3 hospital admissions in the last 6 months and has been treated for pneumonia. COVID-19 tests x 2 (4/7 & 4/30). Palliative medicine was consulted for goals of care. Patient has had hospice services in the past and was agreeable to discharging home with that support again.Writer met in the room with [name]. She appeared very short of breath with any conversation, pursed lip breathing noted. Discussed use of a small dose (5 mg) of liquid morphine for dyspnea relief,side effects of sleepiness/drowsiness discussed,  Anna Mcbride was agreeable.  Writer contacted attending physician Dr. Benjie Karvonen, staff RN Anna Mcbride made aware. Writer stayed with [name] for more than an hour after does was given by staff RN, she did voice some effectiveness. She also became very sleepy and requested to have her husband notified that she wanted to rest "for a while" before going home. Patient repositioned in bed with staff aide Anna Mcbride and left resting comfortably. She did rouse easily to voice.  Writer spoke at the visitors entrance with Anna Mcbride, he stated understanding and felt the "rest would do her good". He requested that staff RN Anna Mcbride call him when [name] awakened and was ready for discharge. Staff RN Anna Mcbride and charge RN Anna Mcbride made aware. Patient information faxed to referral. Thank you. Anna Mcbride BSN, RN, [facility]  [phone]

## 2018-11-20 NOTE — Progress Notes (Signed)
Pastoral Care Visit   11/26/2018 1400  Clinical Encounter Type  Visited With Patient  Visit Type Initial;Spiritual support;Psychological support  Referral From Patient;Nurse  Consult/Referral To Chaplain  Spiritual Encounters  Spiritual Needs Prayer;Emotional  Stress Factors  Patient Stress Factors Health changes;Loss;Other (Comment) (feeling abandoned by God)   Pt had labored breathing but was sitting up in bed.  Pt was weepy and shared about her sadness over feeling abandoned by God.  Anna Mcbride gave pt space to share her sorrow and offered prayer before departing.  Darcey Nora, Chaplain

## 2018-11-20 NOTE — Consult Note (Signed)
Pulmonary Medicine          Date: 12/01/2018,   MRN# [id number] Anna Mcbride [birth year]     AdmissionWeight: 53 kg                 CurrentWeight: 51.6 kg      CHIEF COMPLAINT:   Acute respiratory distress/possible pneumonia with loculated pleural effusion.   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 She was recently hospitalized from 4/7-4/11 with severe sepsis secondary to community-acquired pneumonia and again for CAP with d/c 4/29. She also developed A. fib with RVR during 4/11 hospitalization and was treated with amiodarone.  She has been treated for pneumonia twice this month.  She does have chronic hypoxemia due to chronic lung disease normally uses 2-3 L/min O2 at home.  She states at home visiting nurse noted that patient had some difficulty with respiration but did not have chest pain, chest discomfort, or productive cough, she endorses COVID testing at that time was negative and is again negative on this admission In the hospital, she has been very weak. She feels that her breathing is mostly at baseline except but fever is unusual. She does endorse "wet cough", but states she is not bringing anything up.   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 She is deconditioned and spends most of her day sitting in a chair.  She denies constitutional symptoms. Lab work shows absence of leukocytosis, no fevers since admission, CMP with mild renal insufficiency compared to baseline, normal lactate.  CXR with chronic parynchymal lung disease as well as pleural effusion with suggestion of loculation.    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     SURGICAL HISTORY   Past Surgical History:  Procedure Laterality Date   BONE MARROW TRANSPLANT  05/15/00   NASAL SINUS SURGERY  08/07/05 09/09/05   x 2    VIDEO BRONCHOSCOPY Bilateral 11/30/2014   Procedure: VIDEO BRONCHOSCOPY WITHOUT FLUORO;  Surgeon: Flora Lipps, MD;  Location: [facility] ORS;  Service: Cardiopulmonary;  Laterality: Bilateral;     FAMILY HISTORY   Family History  Problem Relation Age of Onset   Asthma Son    Congestive Heart Failure Mother    Diabetes Brother    Pulmonary embolism Father    Breast cancer Neg Hx      SOCIAL HISTORY   Social History   Tobacco Use   Smoking status: Never Smoker   Smokeless tobacco: Never Used  Substance Use Topics   Alcohol use: Not Currently    Frequency: Never    Comment: a couple glasses of wine a year   Drug use: No     MEDICATIONS    Home Medication:    Current Medication:  Current Facility-Administered Medications:  acetaminophen (TYLENOL) tablet 650 mg, 650 mg, Oral, Q6H PRN, Fritzi Mandes, MD   acetylcysteine (MUCOMYST) 20 % nebulizer / oral solution 4 mL, 4 mL, Nebulization, Q6H, Fritzi Mandes, MD, 4 mL at 12/17/2018 0756   acidophilus (RISAQUAD) capsule 1 capsule, 1 capsule, Oral, TID WC, Fritzi Mandes, MD   albuterol (PROVENTIL) (2.5 MG/3ML) 0.083% nebulizer solution 2.5 mg, 2.5 mg, Nebulization, Q2H PRN, Fritzi Mandes, MD   ALPRAZolam Duanne Moron) tablet 0.5 mg, 0.5 mg, Oral, TID PRN, Fritzi Mandes, MD, 0.5 mg at 11/19/18 2318   amiodarone (PACERONE) tablet 400 mg, 400 mg, Oral, Daily, Fritzi Mandes, MD    ceFEPIme (MAXIPIME) 2 g in sodium chloride 0.9 % 100 mL IVPB, 2 g, Intravenous, Q24H, Fritzi Mandes, MD   citalopram (CELEXA) tablet 20 mg, 20 mg, Oral, Daily, Fritzi Mandes, MD   enoxaparin (LOVENOX) injection 30 mg, 30 mg, Subcutaneous, Q24H, Fritzi Mandes, MD, 30 mg at 11/19/18 2231   feeding supplement (ENSURE ENLIVE) (ENSURE ENLIVE) liquid 237 mL, 237 mL, Oral, TID BM, Fritzi Mandes, MD, 237 mL at 11/19/18 2215   guaiFENesin (MUCINEX) 12 hr tablet 600 mg, 600 mg, Oral, Daily PRN, Fritzi Mandes, MD   ipratropium-albuterol (DUONEB) 0.5-2.5 (3) MG/3ML nebulizer solution 3 mL, 3 mL, Nebulization, Q6H, Fritzi Mandes, MD, 3 mL at 12/20/2018 0756   levothyroxine (SYNTHROID) tablet 25 mcg, 25 mcg, Oral, Q0600, Fritzi Mandes, MD, 25 mcg at 12/09/2018 0546   multivitamin with minerals tablet 1 tablet, 1 tablet, Oral, Daily, Fritzi Mandes, MD, 1 tablet at 11/19/18 2215   pantoprazole (PROTONIX) EC tablet 40 mg, 40 mg, Oral, Daily, Fritzi Mandes, MD   polyethylene glycol (MIRALAX / GLYCOLAX) packet 17 g, 17 g, Oral, Daily PRN, Fritzi Mandes, MD   potassium chloride SA (K-DUR) CR tablet 20 mEq, 20 mEq, Oral, Daily, Fritzi Mandes, MD   senna (SENOKOT) tablet 8.6 mg, 1 tablet, Oral, BID, Fritzi Mandes, MD   sodium chloride 0.9 % bolus 1,000 mL, 1,000 mL, Intravenous, Once, Nance Pear, MD   Derrill Memo ON 11/21/2018] vancomycin (VANCOCIN) IVPB 750 mg/150 ml premix, 750 mg, Intravenous, Q36H, Fritzi Mandes, MD   vitamin C (ASCORBIC ACID) tablet 1,000 mg, 1,000 mg, Oral, Daily, Fritzi Mandes, MD  Facility-Administered Medications Ordered in Other Encounters:    heparin lock flush 100 unit/mL, 500 Units, Intravenous, Once, Brahmanday, Lenetta Quaker R, MD   sodium chloride flush (NS) 0.9 % injection 10 mL, 10 mL, Intravenous, Once, Cammie Sickle, MD    ALLERGIES   Hydrocodone-homatropine; Tussionex pennkinetic er [employer] polst-cpm polst er]; and Ciprofloxacin     REVIEW OF SYSTEMS    Review of Systems:  Gen:   Denies  fever, sweats, chills weigh loss  HEENT: Denies blurred vision, double vision, ear pain, eye pain, hearing loss, nose bleeds, sore throat Cardiac:  No dizziness, chest pain or heaviness, chest tightness,edema Resp:   Admits cough, sputum porduction, shortness of breath, no wheezing, no hemoptysis,  Gi: Denies swallowing difficulty, stomach pain, nausea or vomiting, diarrhea, constipation, bowel incontinence Gu:  Denies bladder incontinence, burning urine Ext:   Denies Joint pain, stiffness or swelling Skin: Denies  skin rash, easy bruising or bleeding or hives Endoc:  Denies polyuria, polydipsia , polyphagia or weight change Psych:   Denies depression, insomnia or hallucinations   Other:  All other systems negative   VS: BP 95/71 (BP Location: Right Arm)    Pulse 81    Temp 97.6 F (36.4 C) (Oral)    Resp (!) 21  Ht 5\' 3"  (1.6 m)    Wt 51.6 kg    LMP  (LMP Unknown)    SpO2 94%    BMI 20.16 kg/m      PHYSICAL EXAM    GENERAL:NAD, no fevers, chills, no weakness no fatigue HEAD: Normocephalic, atraumatic.  EYES: Pupils equal, round, reactive to light. Extraocular muscles intact. No scleral icterus.  MOUTH: Moist mucosal membrane. Dentition intact. No abscess noted.  EAR, NOSE, THROAT: Clear without exudates. No external lesions.  NECK: Supple. No thyromegaly. No nodules. No JVD.  PULMONARY: Diffuse coarse rhonchi bilaterally  CARDIOVASCULAR: S1 and S2. Regular rate and rhythm. No murmurs, rubs, or gallops. No edema. Pedal pulses 2+ bilaterally.  GASTROINTESTINAL: Soft, nontender, nondistended. No masses. Positive bowel sounds. No hepatosplenomegaly.  MUSCULOSKELETAL: No swelling, clubbing, or edema. Range of motion full in all extremities.  NEUROLOGIC: Cranial nerves II through XII are intact. No gross focal neurological deficits. Sensation intact. Reflexes intact.  SKIN: No ulceration, lesions, rashes, or cyanosis. Skin warm and dry. Turgor intact.  PSYCHIATRIC: Mood,  affect within normal limits. The patient is awake, alert and oriented x 3. Insight, judgment intact.       IMAGING          Ct Abdomen Pelvis Wo Contrast  Result Date: 11/15/2018 CLINICAL DATA:  Bilateral lower back pain with nausea. No dysuria or frank hematuria. Fever. Chest pain or shortness of breath. Evaluate for chronic loculated effusion. Recently in hospital with sepsis. EXAM: CT CHEST, ABDOMEN AND PELVIS WITHOUT CONTRAST TECHNIQUE: Multidetector CT imaging of the chest, abdomen and pelvis was performed following the standard protocol without IV contrast. COMPARISON:  CT of the abdomen and pelvis October 27, 2018. PET-CT January 18, 2014. FINDINGS: CT CHEST FINDINGS Cardiovascular: The heart size is normal. Coronary artery calcifications are seen in the left coronary arteries. Atherosclerotic changes are seen in the nonaneurysmal thoracic aorta. The pulmonary arteries are normal in caliber centrally. Mediastinum/Nodes: The thyroid and esophagus are unremarkable. No definite adenopathy. The left Port-A-Cath terminates in the central SVC. Lungs/Pleura: The trachea is unremarkable. Distortion of the central airways from previous radiation again identified. Mild bronchiectasis in the lingula is identified. A nodule in the lingula on series 4, image 84 measuring 7 mm is likely mucus plugging. Similar findings were seen in the lingula on the PET-CT from 2015. Paramediastinal soft tissue is consistent with postradiation change which is similar since 2015. Rounded ground-glass is seen in the right base on series 4, image 99, new since the CT scan from October 27, 2018. Mild ground-glass in the left base on image 99 is also new. Mild more focal opacity on the left such as on image 72 is likely infectious. No other suspicious infiltrates. There is a right-sided pleural effusion which is similar to smaller in the base compared October 27, 2018. There is a component towards the apex which may be loculated, larger  since 2015 and not imaged on the April 2020 study. No definite left-sided pleural effusion. Musculoskeletal: See below CT ABDOMEN PELVIS FINDINGS Hepatobiliary: High attenuation in the gallbladder could represent sludge or stones. No wall thickening identified. No liver masses are noted. Pancreas: Unremarkable. No pancreatic ductal dilatation or surrounding inflammatory changes. Spleen: Normal in size without focal abnormality. Adrenals/Urinary Tract: Adrenal glands are unremarkable. Kidneys are normal, without renal calculi, focal lesion, or hydronephrosis. Bladder is unremarkable. Stomach/Bowel: Other than a small hiatal hernia, the stomach is normal. There is fecal material in the distal ileum. Otherwise, the small bowel is  unremarkable. Extensive colonic diverticuli are identified without evidence of diverticulitis. There is moderate fecal loading throughout the colon. The appendix is normal. Vascular/Lymphatic: Atherosclerotic changes are seen in the nonaneurysmal aorta. No adenopathy. Reproductive: Uterus and bilateral adnexa are unremarkable. Other: No abdominal wall hernia or abnormality. No abdominopelvic ascites. Musculoskeletal: No acute or significant osseous findings. IMPRESSION: 1. The right-sided pleural effusion is smaller at the base compared 2 October 27, 2018. There is also a component of pleural fluid at the right apex which is larger compared to 2015 but was not imaged on the April 2020 study. The apically component is likely loculated. 2. Rounded ground-glass opacity the right base and mild patchy ground-glass opacity in left base, not present October 27, 2018, are favored to represent an infectious process. The ground-glass nature raises the possibility of atypical infections. There is also more focal patchy opacity in the left base which is likely infectious as well. Recommend treatment with short-term follow-up to ensure resolution of these findings. 3. Post radiation changes and scarring in the  chest. 4. High attenuation in the gallbladder could represent sludge or stones. There is no wall thickening or pericholecystic fluid identified. 5. Atherosclerosis in the thoracic and abdominal aorta. Coronary artery calcifications. 6. Fecal loading throughout the colon. 7. Fecal material in the distal ileum likely sequela of slow transit of uncertain etiology. 8. Extensive colonic diverticulosis seen without diverticulitis. Electronically Signed   By: Dorise Bullion III M.D   On: 11/15/2018 10:59   Dg Lumbar Spine 2-3 Views  Result Date: 11/15/2018 CLINICAL DATA:  73 year old admitted for acute kidney injury and possible atypical pneumonia, with chronic low back pain. EXAM: LUMBAR SPINE - 2-3 VIEW COMPARISON:  Bone window images from CT chest abdomen and pelvis performed earlier same day. FINDINGS: Five non-rib-bearing lumbar vertebrae with anatomic alignment. No fractures. Mild disc space narrowing at L3-4. Remaining disc spaces well-preserved. Facet degenerative changes at L3-4, L4-5 and L5-S1. Sacroiliac joints anatomically aligned without significant degenerative change. Aortoiliac atherosclerosis. IMPRESSION: 1. No acute osseous abnormality. 2. Mild degenerative disc disease at L3-4. 3. Facet degenerative changes at L3-4, L4-5 and L5-S1. Electronically Signed   By: Evangeline Dakin M.D.   On: 11/15/2018 18:48   Ct Chest Wo Contrast  Result Date: 11/15/2018 CLINICAL DATA:  Bilateral lower back pain with nausea. No dysuria or frank hematuria. Fever. Chest pain or shortness of breath. Evaluate for chronic loculated effusion. Recently in hospital with sepsis. EXAM: CT CHEST, ABDOMEN AND PELVIS WITHOUT CONTRAST TECHNIQUE: Multidetector CT imaging of the chest, abdomen and pelvis was performed following the standard protocol without IV contrast. COMPARISON:  CT of the abdomen and pelvis October 27, 2018. PET-CT January 18, 2014. FINDINGS: CT CHEST FINDINGS Cardiovascular: The heart size is normal. Coronary artery  calcifications are seen in the left coronary arteries. Atherosclerotic changes are seen in the nonaneurysmal thoracic aorta. The pulmonary arteries are normal in caliber centrally. Mediastinum/Nodes: The thyroid and esophagus are unremarkable. No definite adenopathy. The left Port-A-Cath terminates in the central SVC. Lungs/Pleura: The trachea is unremarkable. Distortion of the central airways from previous radiation again identified. Mild bronchiectasis in the lingula is identified. A nodule in the lingula on series 4, image 84 measuring 7 mm is likely mucus plugging. Similar findings were seen in the lingula on the PET-CT from 2015. Paramediastinal soft tissue is consistent with postradiation change which is similar since 2015. Rounded ground-glass is seen in the right base on series 4, image 99, new since the CT scan from  October 27, 2018. Mild ground-glass in the left base on image 99 is also new. Mild more focal opacity on the left such as on image 72 is likely infectious. No other suspicious infiltrates. There is a right-sided pleural effusion which is similar to smaller in the base compared October 27, 2018. There is a component towards the apex which may be loculated, larger since 2015 and not imaged on the April 2020 study. No definite left-sided pleural effusion. Musculoskeletal: See below CT ABDOMEN PELVIS FINDINGS Hepatobiliary: High attenuation in the gallbladder could represent sludge or stones. No wall thickening identified. No liver masses are noted. Pancreas: Unremarkable. No pancreatic ductal dilatation or surrounding inflammatory changes. Spleen: Normal in size without focal abnormality. Adrenals/Urinary Tract: Adrenal glands are unremarkable. Kidneys are normal, without renal calculi, focal lesion, or hydronephrosis. Bladder is unremarkable. Stomach/Bowel: Other than a small hiatal hernia, the stomach is normal. There is fecal material in the distal ileum. Otherwise, the small bowel is unremarkable.  Extensive colonic diverticuli are identified without evidence of diverticulitis. There is moderate fecal loading throughout the colon. The appendix is normal. Vascular/Lymphatic: Atherosclerotic changes are seen in the nonaneurysmal aorta. No adenopathy. Reproductive: Uterus and bilateral adnexa are unremarkable. Other: No abdominal wall hernia or abnormality. No abdominopelvic ascites. Musculoskeletal: No acute or significant osseous findings. IMPRESSION: 1. The right-sided pleural effusion is smaller at the base compared 2 October 27, 2018. There is also a component of pleural fluid at the right apex which is larger compared to 2015 but was not imaged on the April 2020 study. The apically component is likely loculated. 2. Rounded ground-glass opacity the right base and mild patchy ground-glass opacity in left base, not present October 27, 2018, are favored to represent an infectious process. The ground-glass nature raises the possibility of atypical infections. There is also more focal patchy opacity in the left base which is likely infectious as well. Recommend treatment with short-term follow-up to ensure resolution of these findings. 3. Post radiation changes and scarring in the chest. 4. High attenuation in the gallbladder could represent sludge or stones. There is no wall thickening or pericholecystic fluid identified. 5. Atherosclerosis in the thoracic and abdominal aorta. Coronary artery calcifications. 6. Fecal loading throughout the colon. 7. Fecal material in the distal ileum likely sequela of slow transit of uncertain etiology. 8. Extensive colonic diverticulosis seen without diverticulitis. Electronically Signed   By: Dorise Bullion III M.D   On: 11/15/2018 10:59   Ct Abdomen Pelvis W Contrast  Result Date: 10/27/2018 CLINICAL DATA:  Right-sided abdominal and flank pain. Acute abdominal pain. EXAM: CT ABDOMEN AND PELVIS WITH CONTRAST TECHNIQUE: Multidetector CT imaging of the abdomen and pelvis was  performed using the standard protocol following bolus administration of intravenous contrast. CONTRAST:  34mL OMNIPAQUE IOHEXOL 300 MG/ML  SOLN COMPARISON:  Abdominal CT 12/19/2017. Chest radiograph earlier this day as well as 03/01/2018 FINDINGS: Motion artifact limits assessment of the chest, abdomen, and pelvis. Lower chest: Small layering left pleural effusion. Moderate right pleural effusion is partially loculated anteriorly. Vague geographic ground-glass opacities in the left lung base which are partially obscured by motion. Hepatobiliary: Slight heterogeneous enhancement. Small cyst in the right lobe is unchanged from prior exam. Gallbladder physiologically distended, no calcified stone. No biliary dilatation. Pancreas: Motion artifact. No ductal dilatation or inflammation. There is a 5 mm cyst in the proximal pancreatic body, image 32 series 2. Spleen: Normal in size without gross abnormality, motion obscured. Adrenals/Urinary Tract: Normal adrenal glands. Right kidney is slightly  low in positioning. No hydronephrosis or perinephric edema. Slight delayed excretion on delayed phase imaging. Motion artifact limits detailed assessment. Urinary bladder is physiologically distended. No bladder wall thickening. Stomach/Bowel: Small hiatal hernia. Stomach grossly unremarkable. No bowel obstruction. Enteric contrast reaches the colon. Diverticulosis from the mid transverse colon distally. Diverticular prominent in the sigmoid colon. No evidence of diverticulitis. Air in stool distends the rectum. No rectal wall thickening. No obvious bowel inflammation. Appendix not confidently visualized, no evidence of appendicitis. Vascular/Lymphatic: Advanced aortic atherosclerosis. IVC appears prominent. Moderate branch atherosclerosis. No acute vascular finding. Portal vein appears patent. No definite enlarged lymph nodes in the abdomen or pelvis. Reproductive: Uterus and bilateral adnexa are unremarkable. Ovaries are  quiescent, normal for age. Other: No ascites or free air. No evidence of intra-abdominal abscess. Musculoskeletal: There are no acute or suspicious osseous abnormalities. Degenerative change in the lower lumbar spine. IMPRESSION: 1. Slight heterogeneity of the liver is nonspecific, but may be due to elevated right heart pressures given prominent IVC. No other acute finding in the abdomen or pelvis. Motion artifact slightly limits detailed assessment. 2. Bilateral pleural effusions, small on the left and moderate on the right and partially loculated. Chest x-ray earlier today demonstrated similar radiographic appearance of the chest dating back to 03/01/2018, and findings may be chronic. 3. Colonic diverticulosis without diverticulitis. 4. Incidental 5 mm cyst in the proximal pancreatic body. Recommend follow up pre and post contrast MRI/MRCP or pancreatic protocol CT in 2 years. This recommendation follows ACR consensus guidelines: Management of Incidental Pancreatic Cysts: A White Paper of the ACR Incidental Findings Committee. J Am Coll Radiol 7628;31:[id number]. 5.  Advanced Aortic atherosclerosis (ICD10-I70.0). Electronically Signed   By: Keith Rake M.D.   On: 10/27/2018 21:37   US Renal  Result Date: 11/16/2018 CLINICAL DATA:  Acute kidney injury. EXAM: RENAL / URINARY TRACT ULTRASOUND COMPLETE COMPARISON:  CT scan of November 15, 2018. FINDINGS: Right Kidney: Renal measurements: 9.2 x 4.4 x 4.2 cm = volume: 88 mL. Mildly increased echogenicity of renal parenchyma is noted. No mass or hydronephrosis visualized. Left Kidney: Renal measurements: 9.9 x 5.4 x 4.6 cm = volume: 130 mL. Mildly increased echogenicity of renal parenchyma is noted. No mass or hydronephrosis visualized. Bladder: Appears normal for degree of bladder distention. IMPRESSION: Mildly increased echogenicity of renal parenchyma is noted bilaterally suggesting medical renal disease. No hydronephrosis or renal obstruction is noted.  Electronically Signed   By: Marijo Conception M.D.   On: 11/16/2018 09:27   Dg Chest Portable 1 View  Result Date: 11/19/2018 CLINICAL DATA:  Shortness of breath, sepsis, respiratory distress, history non-Hodgkin's lymphoma, asthma EXAM: PORTABLE CHEST 1 VIEW COMPARISON:  Portable exam 1623 hours compared to 11/17/2018 FINDINGS: Stable LEFT subclavian Port-A-Cath. Normal heart size and mediastinal contours. Slight rotation to the RIGHT. Persistent diffuse BILATERAL pulmonary infiltrates especially RIGHT mid lung and RIGHT base. Loculated pleural effusion at RIGHT apex and RIGHT base unchanged. No pneumothorax. Bones demineralized. IMPRESSION: Persistent BILATERAL pulmonary infiltrates greater on RIGHT with loculated RIGHT pleural effusion. No significant interval change. Electronically Signed   By: Lavonia Dana M.D.   On: 11/19/2018 16:49   Dg Chest Port 1 View  Result Date: 11/17/2018 CLINICAL DATA:  Shortness of breath. History of non-Hodgkin's lymphoma diagnosed 2014. EXAM: PORTABLE CHEST 1 VIEW COMPARISON:  11/15/2018, 03/01/2018 and CT 11/15/2018 FINDINGS: Left subclavian Port-A-Cath unchanged. Chronic coarse interstitial changes bilaterally most prominent over the mid to upper lungs. Moderate size right effusion with fluid tracking to  the apex without significant change. Slight worsening opacification over the mid lung. Cardiomediastinal silhouette and remainder of the exam is unchanged. IMPRESSION: Moderate stable right pleural effusion with loculated component over the apex. Slight worsening opacification over the right midlung which may be due to atelectasis or infection. Chronic coarse bilateral interstitial changes. Electronically Signed   By: Marin Olp M.D.   On: 11/17/2018 08:21   Dg Chest Portable 1 View  Result Date: 11/15/2018 CLINICAL DATA:  73 year old female with history of non-Hodgkin's lymphoma diagnosed in 2014. Shortness of breath. EXAM: PORTABLE CHEST 1 VIEW COMPARISON:  Chest  x-ray 10/30/2018. FINDINGS: Left subclavian single-lumen porta cath with tip terminating in the distal superior vena cava. Extensive pleuroparenchymal architectural distortion and volume loss is again noted in the lungs bilaterally, most evident in the apex of the right hemithorax and in the base of the right hemithorax, similar to numerous prior examinations, most compatible with chronic scarring. No definite acute consolidative airspace disease. Chronic moderate right pleural effusion which appears partially loculated, stable compared to prior examinations. No left pleural effusion. No evidence of pulmonary edema. Heart size is normal. Aortic atherosclerosis. IMPRESSION: 1. Extensive scarring in the thorax redemonstrated with chronic moderate right partially loculated pleural effusion, similar to prior examinations, as above. No definite radiographic evidence of acute cardiopulmonary disease. Electronically Signed   By: Vinnie Langton M.D.   On: 11/15/2018 08:10   Dg Chest Port 1 View  Result Date: 10/30/2018 CLINICAL DATA:  Acute respiratory failure. EXAM: PORTABLE CHEST 1 VIEW COMPARISON:  10/29/2018 FINDINGS: Power port inserted from the left is unchanged with the tip in the SVC above the right atrium. Bilateral pulmonary and pleural scarring persists, with the possibility of some coexistent acute patchy infiltrates. Pattern appears quite similar to multiple previous films. No worsening or new finding. IMPRESSION: No worsening or new finding. Extensive bilateral pleural and parenchymal scarring. Possible associated acute pulmonary infiltrate, particularly suspicious in the left upper lobe. Electronically Signed   By: Nelson Chimes M.D.   On: 10/30/2018 05:33   Dg Chest Port 1 View  Result Date: 10/29/2018 CLINICAL DATA:  Respiratory distress EXAM: PORTABLE CHEST 1 VIEW COMPARISON:  10/27/2018 FINDINGS: Left chest wall Port-A-Cath tip projects over the lower SVC, unchanged. Unchanged size of right  pleural effusion. Increased left basilar atelectasis. Upper right chest opacities are unchanged. IMPRESSION: Slightly increased left basilar atelectasis but otherwise unchanged appearance of chronic lung disease. Electronically Signed   By: Ulyses Jarred M.D.   On: 10/29/2018 20:57   Dg Chest Port 1 View  Result Date: 10/27/2018 CLINICAL DATA:  Fever and cough EXAM: PORTABLE CHEST 1 VIEW COMPARISON:  03/01/2018 FINDINGS: Extensive chronic lung disease apical scarring bilaterally. Extensive on the right with retraction of the right hilum. Left hilar retraction also unchanged due to apical scarring. Extensive pleuroparenchymal scarring in the right lung base unchanged. Diffuse parenchymal airspace density appears chronic and unchanged. Port-A-Cath tip in the SVC. IMPRESSION: Extensive chronic lung disease bilaterally right greater than left, stable from prior studies. No superimposed acute abnormality. Electronically Signed   By: Franchot Gallo M.D.   On: 10/27/2018 18:21      ASSESSMENT/PLAN       Febrile illness with acute respiratory distress     -currently on home O2 setting 3L/min     - Patient has cxr with increased opacification on right and suggestion of R loculated pleural effusion- poss infection/CA related.      - will order US guided thoracentesis via  IR service - patient did not eat this am - INR normal - she is on prophylactic dose lovenox     - will continue home meds      - DuoNeb and aggressive BPH     - wet sounding ronchorous breath sounds that patient is unable to expectorate due to chronically weak status- may add low dose Robinul due to no additional options to decrease phlegm production.      - continue empiric ABX for CAP - nasal MRSA negative - likely do not need vancomycin -  Discussed case with Dr Benjie Karvonen.            Mild protein calorie malnutrition      - patient is overall very deconditioned which is hindering her pulmonary status     - I have encouraged her to  continue to try PT      - she is status post hospice due to hx of lung ca/chemo/rads/BM transplant     Thank you for allowing me to participate in the care of this patient.     Patient/Family are satisfied with care plan and all questions have been answered.  This document was prepared using Dragon voice recognition software and may include unintentional dictation errors.     Ottie Glazier, M.D.  Division of [place]

## 2018-11-20 NOTE — TOC Transition Note (Addendum)
Transition of Care [facility]) - CM/SW Discharge Note   Patient Details  Name: KENLEE VOGT MRN: [id number] Date of Birth: 07/19/1946  Transition of Care [facility]) CM/SW Contact:  Elza Rafter, RN Phone Number: 11/24/2018, 11:44 AM   Clinical Narrative:   Per Dr. Benjie Karvonen the plan is to discharge patient home with hospice today.  This RNCM to patient room and offered choice.  She would like [facility] as she has used [facility] in the past.  Aniceto Boss with Palliative is speaking with patient now.  Referral to Flo Shanks, RN with [facility].  Notified Cassie with [facility] of plan as well.  Patient does not want a hospital bed at this time.  She is on chronic oxygen.  Husband will transport home.     Final next level of care: Home w Hospice Care Barriers to Discharge: No Barriers Identified   Patient Goals and CMS Choice Patient states their goals for this hospitalization and ongoing recovery are:: [place] to DC to home with [facility] CMS Medicare.gov Compare Post Acute Care list provided to:: Patient Choice offered to / list presented to : Patient  Discharge Placement                       Discharge Plan and Services                            [place]: [facility] Date [facility] Agency Contacted: 12/14/2018 Time [place]: [id number] Representative spoke with at [place]: Flo Shanks, RN  Social Determinants of Health (SDOH) Interventions     Readmission Risk Interventions Readmission Risk Prevention Plan 11/16/2018 11/16/2018 10/29/2018  Transportation Screening Complete Complete Complete  PCP or Specialist Appt within 3-5 Days - - Complete  HRI or [place] - - Complete  Social Work Consult for [place] Planning/Counseling - - Patient refused  Palliative Care Screening - - Not Applicable  Medication Review Press photographer) Complete - Complete  HRI or Home Care Consult Complete - -  Palliative Care Screening Complete -  -  [place] Not Applicable - -  Some recent data might be hidden

## 2018-11-20 NOTE — Consult Note (Signed)
Consultation Note Date: 12/09/2018   Patient Name: Anna Mcbride  DOB: 04-Jun-1946  MRN: [id number]  Age / Sex: 73 y.o., female  PCP: Anna Hire, MD Referring Physician: Bettey Costa, MD  Reason for Consultation: Establishing goals of care  HPI/Patient Profile: 73 y.o. 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  recurrent admissions for pneumonia just discharge on 429 2020 with community acquired pneumonia admitted on 11/19/2018 with acute respiratory distress with acute on chronic hypoxic respiratory failure.   Clinical Assessment and Goals of Care: Anna Mcbride is sitting quietly in bed.  She greets me making and somewhat keeping eye contact.  She has a flat affect, but is calm and cooperative.  There is no family at bedside at this time due to visitor restrictions.  Anna Mcbride and I talked about her choice to go to her home with [facility] Mcbride of [place] care.  At this point she would like to treat the treatable care.  Anna Mcbride is experience with hospice and graduated February of this year.  She states that she would like to have the same registered nurse who cared for her earlier in the year, and I encouraged her to speak with hospice provider about that.  We talked about preferred place of death, home.  We talked about rehospitalization.  Anna Mcbride states that her goal is to not come back to the hospital.  I asked if her husband could agree with that and she tells me that she thinks he can.  I encouraged her to have this discussion with him.  Conference with case management related to disposition, patient needs.  Conference with hospice liaison related to patient needs, symptom management.  HCPOA     NEXT OF KIN -spouse   SUMMARY OF RECOMMENDATIONS   Home with the benefits of [place]. Do not  rehospitalize   Code Status/Advance Care Planning:  DNR  Symptom Management:   Per hospice protocol  Palliative Prophylaxis:   Frequent Pain Assessment, Oral Care and Turn Reposition  Additional Recommendations (Limitations, Scope, Preferences):  Treat the treatable but no CPR, no intubation.  Home with hospice  Psycho-social/Spiritual:   Desire for further Chaplaincy support:no  Additional Recommendations: Caregiving  Support/Resources and Education on Hospice  Prognosis:   < 6 months, 3 to 6 months or less would not be surprising based on chronic illness burden. Discharge Planning: Home with the benefits of hospice for treat the treatable care. [facility] of [place].     Primary Diagnoses: Present on Admission: . Sepsis ([place])   I have reviewed the medical record, interviewed the patient and family, and examined the patient. The following aspects are pertinent.  Past Medical History:  Diagnosis Date  . Acquired hypothyroidism 07/01/2013  . Acute respiratory failure ([place]) 11/29/2014  . 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 Asthma   . Back pain with sciatica 05/07/2017  . Biliary sludge 12/19/2017  . Lymphoma, non-Hodgkin's ([place]) 06/30/2013  .  Non Hodgkin's lymphoma ([place]) [id number]  . Personal history of chemotherapy   . Recurrent upper respiratory infection (URI) 09/01/2015  . 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 Marital status: Married    Spouse name: Not on file  . Number of children: 2  . Years of education: Not on file  . Highest education level: Not on file  Occupational History  . Occupation: Retired    Comment: Tour manager  . Financial resource strain: Not on file  . Food insecurity:    Worry: Not on file    Inability: Not on file  . Transportation needs:    Medical: Not on file    Non-medical: Not on file  Tobacco Use  . Smoking status: Never Smoker  . Smokeless tobacco:  Never Used  Substance and Sexual Activity  . Alcohol use: Not Currently    Frequency: Never    Comment: a couple glasses of wine a year  . Drug use: No  . Sexual activity: Not on file  Lifestyle  . Physical activity:    Days per week: Not on file    Minutes per session: Not on file  . Stress: Not on file  Relationships  . Social connections:    Talks on phone: Not on file    Gets together: Not on file    Attends religious service: Not on file    Active member of club or organization: Not on file    Attends meetings of clubs or organizations: Not on file    Relationship status: Not on file  Other Topics Concern  . Not on file  Social History Narrative  . Not on file   Family History  Problem Relation Age of Onset  . Asthma Son   . Congestive Heart Failure Mother   . Diabetes Brother   . Pulmonary embolism Father   . Breast cancer Neg Hx    Scheduled Meds: . acetylcysteine  4 mL Nebulization Q6H  . acidophilus  1 capsule Oral TID WC  . amiodarone  400 mg Oral Daily  . citalopram  20 mg Oral Daily  . enoxaparin (LOVENOX) injection  30 mg Subcutaneous Q24H  . feeding supplement (ENSURE ENLIVE)  237 mL Oral TID BM  . glycopyrrolate  1 mg Oral BID  . ipratropium-albuterol  3 mL Nebulization Q6H  . levothyroxine  25 mcg Oral Q0600  . multivitamin with minerals  1 tablet Oral Daily  . pantoprazole  40 mg Oral Daily  . potassium chloride SA  20 mEq Oral Daily  . senna  1 tablet Oral BID  . vitamin C  1,000 mg Oral Daily   Continuous Infusions: . ceFEPime (MAXIPIME) IV    . sodium chloride     PRN Meds:.acetaminophen, albuterol, ALPRAZolam, guaiFENesin, polyethylene glycol Medications Prior to Admission:  Prior to Admission medications   Medication Sig Start Date End Date Taking? Authorizing Provider  acetylcysteine (MUCOMYST) 20 % nebulizer solution Take 4 mLs by nebulization every 6 (six) hours.    Yes [provider]  ALPRAZolam (XANAX) 0.5 MG tablet Take 1  tablet (0.5 mg total) by mouth at bedtime. Patient taking differently: Take 0.5 mg by mouth 3 (three) times daily as needed for anxiety.  01/08/18  Yes Gouru, Illene Silver, MD  amiodarone (PACERONE) 400 MG tablet Take 1 tablet (400 mg total) by mouth daily. 10/31/18  Yes Salary, Avel Peace, MD  citalopram (CELEXA) 20 MG tablet  TAKE 1 TABLET BY MOUTH ONCE DAILY Patient taking differently: Take 20 mg by mouth daily.  12/16/17  Yes Cammie Sickle, MD  ipratropium-albuterol (DUONEB) 0.5-2.5 (3) MG/3ML SOLN Take 3 mLs by nebulization every 6 (six) hours. 01/08/18  Yes Gouru, Illene Silver, MD  levofloxacin (LEVAQUIN) 250 MG tablet Take 1 tablet (250 mg total) by mouth daily. 11/18/18  Yes Sudini, Alveta Heimlich, MD  levothyroxine (SYNTHROID, LEVOTHROID) 25 MCG tablet TAKE 1 TABLET BY MOUTH ONCE DAILY ON AN EMPTY STOMACH. WAIT 30 MINUTES BEFORE TAKING OTHER MEDS. Patient taking differently: Take 25 mcg by mouth daily.  12/03/17  Yes Cammie Sickle, MD  Multiple Vitamin (MULTIVITAMIN WITH MINERALS) TABS tablet Take 1 tablet by mouth daily. 01/09/18  Yes Gouru, Aruna, MD  omeprazole (PRILOSEC) 20 MG capsule TAKE 1 CAPSULE BY MOUTH TWICE DAILY BEFORE MEALS Patient taking differently: Take 20 mg by mouth daily.  07/07/18  Yes Cammie Sickle, MD  potassium chloride SA (K-DUR,KLOR-CON) 20 MEQ tablet Take 1 tablet (20 mEq total) by mouth daily. 10/27/17  Yes Cammie Sickle, MD  predniSONE (DELTASONE) 10 MG tablet Take 2.5 tablets by mouth daily.    Yes [provider]  vitamin C (ASCORBIC ACID) 500 MG tablet Take 1,000 mg by mouth daily.    Yes [provider]  acetaminophen (TYLENOL) 325 MG tablet Take 2 tablets (650 mg total) by mouth every 6 (six) hours as needed for mild pain (headache). 01/08/18   Gouru, Illene Silver, MD  albuterol (PROVENTIL HFA;VENTOLIN HFA) 108 (90 BASE) MCG/ACT inhaler Inhale 1 puff into the lungs every 6 (six) hours as needed for wheezing or shortness of breath.    [provider]  feeding supplement, ENSURE ENLIVE, (ENSURE ENLIVE) LIQD Take 237 mLs by mouth 3 (three) times daily between meals. 01/08/18   Gouru, Illene Silver, MD  guaiFENesin (MUCINEX) 600 MG 12 hr tablet Take 600 mg by mouth daily.     [provider]  lactobacillus acidophilus & bulgar (LACTINEX) chewable tablet Chew 1 tablet by mouth 3 (three) times daily with meals. 10/31/18   Salary, Avel Peace, MD  Respiratory Therapy Supplies (FLUTTER) DEVI Use 10-15 times daily 07/29/16   Flora Lipps, MD   Allergies  Allergen Reactions  . Hydrocodone-Homatropine Itching  . Tussionex Pennkinetic Er [Hydrocod Polst-Cpm Polst Er] Itching  . Ciprofloxacin Rash   Review of Systems  Unable to perform ROS: Acuity of condition    Physical Exam Vitals signs and nursing note reviewed.  Constitutional:      General: She is not in acute distress.    Appearance: She is ill-appearing.     Comments: Appears chronically ill and frail.  Pursed lip breathing at times  HENT:     Head: Normocephalic and atraumatic.  Cardiovascular:     Rate and Rhythm: Normal rate.  Pulmonary:     Effort: No respiratory distress.     Comments: Pursed lip breathing at times. Abdominal:     General: Abdomen is flat. There is no distension.  Skin:    General: Skin is warm and dry.  Neurological:     General: No focal deficit present.     Mental Status: She is alert and oriented to person, place, and time.  Psychiatric:     Comments: Flat affect, calm and cooperative     Vital Signs: BP 114/61 (BP Location: Right Arm)   Pulse 90   Temp 97.9 F (36.6 C) (Oral)   Resp 20   Ht 5\' 3"  (  1.6 m)   Wt 51.6 kg   LMP  (LMP Unknown)   SpO2 93%   BMI 20.16 kg/m  Pain Scale: 0-10   Pain Score: 0-No pain   SpO2: SpO2: 93 % O2 Device:SpO2: 93 % O2 Flow Rate: .O2 Flow Rate (L/min): 3 L/min  IO: Intake/output summary:   Intake/Output Summary (Last 24 hours) at 12/02/2018 0932 Last data filed at 11/19/2018 1500 Gross per  24 hour  Intake 100 ml  Output -  Net 100 ml    LBM: Last BM Date: 11/19/18 Baseline Weight: Weight: 53 kg Most recent weight: Weight: 51.6 kg     Palliative Assessment/Data:   Flowsheet Rows     Most Recent Value  Intake Tab  Referral Department  Hospitalist  Unit at Time of Referral  Cardiac/Telemetry Unit  Palliative Care Primary Diagnosis  Pulmonary  Date Notified  11/19/18  Palliative Care Type  Return patient Palliative Care  Reason for referral  Clarify Goals of Care  Date of Admission  11/19/18  Date first seen by Palliative Care  12/15/2018  # of days Palliative referral response time  1 Day(s)  # of days IP prior to Palliative referral  0  Clinical Assessment  Palliative Performance Scale Score  50%  Pain Max last 24 hours  Not able to report  Pain Min Last 24 hours  Not able to report  Dyspnea Max Last 24 Hours  Not able to report  Dyspnea Min Last 24 hours  Not able to report  Psychosocial & Spiritual Assessment  Palliative Care Outcomes      Time In: 0940 Time Out: 1050 Time Total: 70 minutes Greater than 50%  of this time was spent counseling and coordinating care related to the above assessment and plan.  Signed by: Drue Novel, NP   Please contact Palliative Medicine Team phone at [phone] for questions and concerns.  For individual provider: See Shea Evans

## 2018-11-20 NOTE — Discharge Summary (Addendum)
[facility] NAME: Anna Mcbride    MR#:  [id number]  DATE OF BIRTH:  16-Aug-1945  DATE OF ADMISSION:  11/19/2018 ADMITTING PHYSICIAN: Fritzi Mandes, MD  DATE OF DISCHARGE: 12/06/2018  PRIMARY CARE PHYSICIAN: Baxter Hire, MD    ADMISSION DIAGNOSIS:  Shortness of breath [R06.02] Fever, unspecified fever cause [R50.9] Pneumonia due to infectious organism, unspecified laterality, unspecified part of lung [J18.9]  DISCHARGE DIAGNOSIS:  Active Problems: Acute respiratory distress with acute on chronic hypoxic respiratory failure Pulmonary fibrosis   SECONDARY DIAGNOSIS:   Past Medical History:  Diagnosis Date  . Acquired hypothyroidism 07/01/2013  . Acute respiratory failure ([place]) 11/29/2014  . 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 Asthma   . Back pain with sciatica 05/07/2017  . Biliary sludge 12/19/2017  . Lymphoma, non-Hodgkin's ([place]) 06/30/2013  . Non Hodgkin's lymphoma ([place]) [id number]  . Personal history of chemotherapy   . Recurrent upper respiratory infection (URI) 09/01/2015  . 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  1.  Febrile illness with acute hypoxic respiratory failure on chronic respiratory failure with baseline oxygen of 3 L: Sepsis has been ruled out.  Patient eval by cardiology.  Patient has a loculated left pleural effusion.  Plan was for thoracentesis. As per pulmonary consultation they do not feel patient has ongoing acute infection.  She has been treated twice recently for pneumonia.  Patient wanted to be discharged home with hospice care.  2.  Mild protein calorie malnutrition:  3.  Non-Hodgkin's lymphoma in remission  Patient reports that she would like to be discharged with hospice.    DISCHARGE  CONDITIONS AND DIET:  Guarded condition on regular diet  CONSULTS OBTAINED:    DRUG ALLERGIES:   Allergies  Allergen Reactions  . Hydrocodone-Homatropine Itching  . Tussionex Pennkinetic Er [Hydrocod Polst-Cpm Polst Er] Itching  . Ciprofloxacin Rash    DISCHARGE MEDICATIONS:   Allergies as of 11/23/2018      Reactions   Hydrocodone-homatropine Itching   Tussionex Pennkinetic Er [hydrocod Polst-cpm Polst Er] Itching   Ciprofloxacin Rash      Medication List    STOP taking these medications   levofloxacin 250 MG tablet Commonly known as:  Levaquin     TAKE these medications   acetaminophen 325 MG tablet Commonly known as:  TYLENOL Take 2 tablets (650 mg total) by mouth every 6 (six) hours as needed for mild pain (headache).   acetylcysteine 20 % nebulizer solution Commonly known as:  MUCOMYST Take 4 mLs by nebulization every 6 (six) hours.   albuterol 108 (90 Base) MCG/ACT inhaler Commonly known as:  VENTOLIN HFA Inhale 1 puff into the lungs every 6 (six) hours as needed for wheezing or shortness of breath.   ALPRAZolam 0.5 MG tablet Commonly known as:  XANAX Take 1 tablet (0.5 mg total) by mouth at bedtime. What changed:    when to take this  reasons to take this   amiodarone 400 MG tablet Commonly known as:  PACERONE Take 1 tablet (400 mg total) by mouth daily.   citalopram 20 MG tablet Commonly known as:  CELEXA TAKE 1 TABLET BY MOUTH ONCE DAILY   feeding supplement (ENSURE ENLIVE) Liqd Take 237 mLs by mouth 3 (three) times daily between meals.   Flutter Kerrin Mo  Use 10-15 times daily   glycopyrrolate 1 MG tablet Commonly known as:  ROBINUL Take 1 tablet (1 mg total) by mouth 2 (two) times daily.   guaiFENesin 600 MG 12 hr tablet Commonly known as:  MUCINEX Take 600 mg by mouth daily.   ipratropium-albuterol 0.5-2.5 (3) MG/3ML Soln Commonly known as:  DUONEB Take 3 mLs by nebulization every 6 (six) hours.   lactobacillus acidophilus & bulgar  chewable tablet Chew 1 tablet by mouth 3 (three) times daily with meals.   levothyroxine 25 MCG tablet Commonly known as:  SYNTHROID TAKE 1 TABLET BY MOUTH ONCE DAILY ON AN EMPTY STOMACH. WAIT 30 MINUTES BEFORE TAKING OTHER MEDS. What changed:  See the new instructions.   morphine CONCENTRATE 10 MG/0.5ML Soln concentrated solution Take 0.25 mLs (5 mg total) by mouth every 2 (two) hours as needed for moderate pain, severe pain, anxiety or shortness of breath.   multivitamin with minerals Tabs tablet Take 1 tablet by mouth daily.   omeprazole 20 MG capsule Commonly known as:  PRILOSEC TAKE 1 CAPSULE BY MOUTH TWICE DAILY BEFORE MEALS What changed:  See the new instructions.   potassium chloride SA 20 MEQ tablet Commonly known as:  K-DUR Take 1 tablet (20 mEq total) by mouth daily.   predniSONE 10 MG tablet Commonly known as:  DELTASONE Take 2.5 tablets by mouth daily.   vitamin C 500 MG tablet Commonly known as:  ASCORBIC ACID Take 1,000 mg by mouth daily.         Today   CHIEF COMPLAINT:   Patient wants to go home with hospice.  She does want thoracentesis.   VITAL SIGNS:  Blood pressure (!) 102/55, pulse 85, temperature 97.9 F (36.6 C), temperature source Oral, resp. rate 18, height 5\' 3"  (1.6 m), weight 51.6 kg, SpO2 98 %.   REVIEW OF SYSTEMS:  Review of Systems  Constitutional: Negative.  Negative for chills, fever and malaise/fatigue.  HENT: Negative.  Negative for ear discharge, ear pain, hearing loss, nosebleeds and sore throat.   Eyes: Negative.  Negative for blurred vision and pain.  Respiratory: Positive for shortness of breath. Negative for cough, hemoptysis and wheezing.   Cardiovascular: Negative.  Negative for chest pain, palpitations and leg swelling.  Gastrointestinal: Negative.  Negative for abdominal pain, blood in stool, diarrhea, nausea and vomiting.  Genitourinary: Negative.  Negative for dysuria.  Musculoskeletal: Negative.  Negative for  back pain.  Skin: Negative.   Neurological: Negative for dizziness, tremors, speech change, focal weakness, seizures and headaches.  Endo/Heme/Allergies: Negative.  Does not bruise/bleed easily.  Psychiatric/Behavioral: Negative.  Negative for depression, hallucinations and suicidal ideas.     PHYSICAL EXAMINATION:  GENERAL:  73 y.o.-year-old patient lying in the bed with no acute distress.  NECK:  Supple, no jugular venous distention. No thyroid enlargement, no tenderness.  LUNGS: Decreased breath sounds right side.  Bilateral rhonchi.  CARDIOVASCULAR: S1, S2 normal. No murmurs, rubs, or gallops.  ABDOMEN: Soft, non-tender, non-distended. Bowel sounds present. No organomegaly or mass.  EXTREMITIES: No pedal edema, cyanosis, or clubbing.  PSYCHIATRIC: The patient is alert and oriented x 3.  SKIN: No obvious rash, lesion, or ulcer.   DATA REVIEW:   CBC Recent Labs  Lab 11/19/18 1609  WBC 10.2  HGB 9.6*  HCT 30.9*  PLT 182    Chemistries  Recent Labs  Lab 11/19/18 1609  NA 139  K 3.8  CL 100  CO2 28  GLUCOSE 114*  BUN 31*  CREATININE  1.57*  CALCIUM 9.2  AST 21  ALT 20  ALKPHOS 83  BILITOT 0.9    Cardiac Enzymes No results for input(s): TROPONINI in the last 168 hours.  Microbiology Results  @[email or web address] @  RADIOLOGY:  Dg Chest Port 1 View  Result Date: 12/12/2018 CLINICAL DATA:  S/P  Thoracentesis left side today EXAM: PORTABLE CHEST - 1 VIEW COMPARISON:  the previous day's study FINDINGS: No pneumothorax. Heart size within normal limits. Aortic Atherosclerosis (ICD10-170.0). Left subclavian port catheter to the SVC. Persistent loculated apically and basilar components of right pleural effusion. Extensive coarse atelectasis/consolidation throughout much of the right lung, marginally improved centrally since prior study. Coarse interstitial and scattered airspace opacities in the left lung involving apex more than the base , slightly increased. IMPRESSION: No  pneumothorax post left thoracentesis. Electronically Signed   By: Lucrezia Europe M.D.   On: 12/05/2018 11:38   Dg Chest Portable 1 View  Result Date: 11/19/2018 CLINICAL DATA:  Shortness of breath, sepsis, respiratory distress, history non-Hodgkin's lymphoma, asthma EXAM: PORTABLE CHEST 1 VIEW COMPARISON:  Portable exam 1623 hours compared to 11/17/2018 FINDINGS: Stable LEFT subclavian Port-A-Cath. Normal heart size and mediastinal contours. Slight rotation to the RIGHT. Persistent diffuse BILATERAL pulmonary infiltrates especially RIGHT mid lung and RIGHT base. Loculated pleural effusion at RIGHT apex and RIGHT base unchanged. No pneumothorax. Bones demineralized. IMPRESSION: Persistent BILATERAL pulmonary infiltrates greater on RIGHT with loculated RIGHT pleural effusion. No significant interval change. Electronically Signed   By: Lavonia Dana M.D.   On: 11/19/2018 16:49   US Thoracentesis Asp Pleural Space W/img Guide  Result Date: 12/04/2018 INDICATION: Pneumonia, fever, pulmonary fibrosis. Complex chronic right pleural effusion. Increasing left pleural effusion. Thoracentesis requested. EXAM: ULTRASOUND GUIDED LEFT THORACENTESIS MEDICATIONS: None. COMPLICATIONS: None immediate.  No pneumothorax on follow-up radiograph. PROCEDURE: An ultrasound guided thoracentesis was thoroughly discussed with the patient and questions answered. The benefits, risks, alternatives and complications were also discussed. The patient understands and wishes to proceed with the procedure. Written consent was obtained. Ultrasound was performed to localize and mark an adequate pocket of fluid in the LEFT chest. The area was then prepped and draped in the normal sterile fashion. 1% Lidocaine was used for local anesthesia. Under ultrasound guidance a Safe-T-Centesis catheter was introduced. Thoracentesis was performed. The catheter was removed and a dressing applied. FINDINGS: A total of approximately 0.5 L of clear yellow fluid was  removed. Samples were sent to the laboratory as requested by the clinical team. IMPRESSION: Successful ultrasound guided left thoracentesis yielding 0.5 L of pleural fluid. Electronically Signed   By: Lucrezia Europe M.D.   On: 12/03/2018 11:32      Allergies as of 11/28/2018      Reactions   Hydrocodone-homatropine Itching   Tussionex Pennkinetic Er [hydrocod Polst-cpm Polst Er] Itching   Ciprofloxacin Rash      Medication List    STOP taking these medications   levofloxacin 250 MG tablet Commonly known as:  Levaquin     TAKE these medications   acetaminophen 325 MG tablet Commonly known as:  TYLENOL Take 2 tablets (650 mg total) by mouth every 6 (six) hours as needed for mild pain (headache).   acetylcysteine 20 % nebulizer solution Commonly known as:  MUCOMYST Take 4 mLs by nebulization every 6 (six) hours.   albuterol 108 (90 Base) MCG/ACT inhaler Commonly known as:  VENTOLIN HFA Inhale 1 puff into the lungs every 6 (six) hours as needed for wheezing or shortness of breath.  ALPRAZolam 0.5 MG tablet Commonly known as:  XANAX Take 1 tablet (0.5 mg total) by mouth at bedtime. What changed:    when to take this  reasons to take this   amiodarone 400 MG tablet Commonly known as:  PACERONE Take 1 tablet (400 mg total) by mouth daily.   citalopram 20 MG tablet Commonly known as:  CELEXA TAKE 1 TABLET BY MOUTH ONCE DAILY   feeding supplement (ENSURE ENLIVE) Liqd Take 237 mLs by mouth 3 (three) times daily between meals.   Flutter Devi Use 10-15 times daily   glycopyrrolate 1 MG tablet Commonly known as:  ROBINUL Take 1 tablet (1 mg total) by mouth 2 (two) times daily.   guaiFENesin 600 MG 12 hr tablet Commonly known as:  MUCINEX Take 600 mg by mouth daily.   ipratropium-albuterol 0.5-2.5 (3) MG/3ML Soln Commonly known as:  DUONEB Take 3 mLs by nebulization every 6 (six) hours.   lactobacillus acidophilus & bulgar chewable tablet Chew 1 tablet by mouth 3  (three) times daily with meals.   levothyroxine 25 MCG tablet Commonly known as:  SYNTHROID TAKE 1 TABLET BY MOUTH ONCE DAILY ON AN EMPTY STOMACH. WAIT 30 MINUTES BEFORE TAKING OTHER MEDS. What changed:  See the new instructions.   morphine CONCENTRATE 10 MG/0.5ML Soln concentrated solution Take 0.25 mLs (5 mg total) by mouth every 2 (two) hours as needed for moderate pain, severe pain, anxiety or shortness of breath.   multivitamin with minerals Tabs tablet Take 1 tablet by mouth daily.   omeprazole 20 MG capsule Commonly known as:  PRILOSEC TAKE 1 CAPSULE BY MOUTH TWICE DAILY BEFORE MEALS What changed:  See the new instructions.   potassium chloride SA 20 MEQ tablet Commonly known as:  K-DUR Take 1 tablet (20 mEq total) by mouth daily.   predniSONE 10 MG tablet Commonly known as:  DELTASONE Take 2.5 tablets by mouth daily.   vitamin C 500 MG tablet Commonly known as:  ASCORBIC ACID Take 1,000 mg by mouth daily.          Management plans discussed with the patient and she is in agreement. Stable for discharge   Patient should follow up with hospice  CODE STATUS:     Code Status Orders  (From admission, onward)         Start     Ordered   11/22/2018 0913  Do not attempt resuscitation (DNR)  Continuous    Question Answer Comment  In the event of cardiac or respiratory ARREST Do not call a "code blue"   In the event of cardiac or respiratory ARREST Do not perform Intubation, CPR, defibrillation or ACLS   In the event of cardiac or respiratory ARREST Use medication by any route, position, wound care, and other measures to relive pain and suffering. May use oxygen, suction and manual treatment of airway obstruction as needed for comfort.      12/01/2018 0913        Code Status History    Date Active Date Inactive Code Status Order ID Comments User Context   11/19/2018 2100 12/17/2018 0913 Full Code [id number]  Fritzi Mandes, MD Inpatient   11/15/2018 1427 11/18/2018 1739  Full Code [id number]  Sela Hua, MD Inpatient   10/28/2018 0136 10/31/2018 1906 Full Code [id number]  Lance Coon, MD Inpatient   03/01/2018 2245 03/03/2018 1718 Full Code [id number]  Mayo, Pete Pelt, MD Inpatient   01/02/2018 1206 01/08/2018 2006 Full Code [id number]  [name], MD  Inpatient   12/20/2017 0128 12/21/2017 1718 Full Code [id number]  Lance Coon, MD Inpatient   05/29/2017 1740 05/30/2017 1649 Full Code [id number]  Henreitta Leber, MD Inpatient   11/26/2016 1844 11/29/2016 1513 Full Code [id number]  Henreitta Leber, MD Inpatient   09/12/2016 0223 09/13/2016 1756 DNR [id number]  Harrie Foreman, MD ED   06/18/2016 1821 06/20/2016 1535 DNR [id number]  Vaughan Basta, MD Inpatient   11/29/2014 1405 12/05/2014 1550 DNR [id number]  Evlyn Kanner, NP Inpatient    Advance Directive Documentation     Most Recent Value  Type of Advance Directive  Healthcare Power of Attorney, Living will  Pre-existing out of facility DNR order (yellow form or pink MOST form)  -  "MOST" Form in Place?  -      TOTAL TIME TAKING CARE OF THIS PATIENT: 39 minutes.    Note: This dictation was prepared with Dragon dictation along with smaller phrase technology. Any transcriptional errors that result from this process are unintentional.  Bettey Costa M.D on 12/10/2018 at 12:57 PM  Between 7am to 6pm - Pager - [phone] After 6pm go to [email or web address] - password [email or web address] [facility]  Office  [phone]  CC: Primary care physician; Baxter Hire, MD

## 2018-11-20 NOTE — Procedures (Signed)
  Procedure: US thoracentesis LEFT   EBL:   minimal Complications:  none immediate  See full dictation in [facility].  Dillard Cannon MD Main # [phone] Pager  [phone]

## 2018-11-21 NOTE — Discharge Summary (Signed)
[facility] NAME: Anna Mcbride    MR#:  [id number]  DATE OF BIRTH:  [birth year]  DATE OF ADMISSION:  11/15/2018 ADMITTING PHYSICIAN: Sela Hua, MD  DATE OF DISCHARGE: 11/18/2018  2:34 PM  PRIMARY CARE PHYSICIAN: Baxter Hire, MD   ADMISSION DIAGNOSIS:  AKI (acute kidney injury) ([place]) [N17.9] Opacity of lung on imaging study [R91.8]  DISCHARGE DIAGNOSIS:  Active Problems:   Atypical pneumonia   SECONDARY DIAGNOSIS:   Past Medical History:  Diagnosis Date  . Acquired hypothyroidism 07/01/2013  . Acute respiratory failure ([place]) 11/29/2014  . 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 Asthma   . Back pain with sciatica 05/07/2017  . Biliary sludge 12/19/2017  . Lymphoma, non-Hodgkin's ([place]) 06/30/2013  . Non Hodgkin's lymphoma ([place]) [id number]  . Personal history of chemotherapy   . Recurrent upper respiratory infection (URI) 09/01/2015  . 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  Of note, she was recently hospitalized from 4/7-4/11 with severe sepsis secondary to community-acquired pneumonia.  COVID testing at that time was negative.  She also developed A. fib with RVR during that hospitalization and was treated with amiodarone.  In the hospital, she has been very weak.  She feels that her breathing is mostly at baseline.  She does endorse " wet cough", but states she is not bringing anything up.  Over the last couple of days, she has developed low back pain and nausea.  She states she has been unable to take anything by mouth.  2 days ago, she had a fever to 101F.  Her home health nurse called her PCP, who recommended that she come to the ED.  Patient states that she did  not come initially, because she did not want to be admitted again.  Her nausea was so bad this morning, that she decided to finally come to the ED.  She denies any chest pain.  She endorses some mild lower abdominal pain.  In the ED, vitals were unremarkable.  She was afebrile.  Labs are significant for K3.3, creatinine 1.72, hemoglobin 10.8.  UA was noninfectious.  CT chest with right-sided pleural effusion that is smaller at the base compared to previous imaging, but pleural fluid at the right apex that is larger and likely loculated.  She also has ground glass opacities in the right base and left base, likely atypical infectious process.  CT abdomen pelvis with fecal loading throughout the colon and extensive colonic diverticulosis without diverticulitis.  Hospitalists were called for admission.   HOSPITAL COURSE:   1.  Acute on chronic hypoxic respiratory failure secondary to community-acquired pneumonia. Patient treated with IV Levaquin in the hospital.  Followed by Dr. Clearnce Sorrel of pulmonary.  Unfortunately patient has poor baseline pulmonary status.  Very tenuous condition.  She was monitored an extra day.  Given dose of IV Lasix for possible pulmonary edema which occurred due to fluid resuscitation on admission.  On the day of discharge seen by pulmonary and patient discharged home with close follow-up in 2 days in the office.  High risk for readmission.  Patient continues to wish to be full code.  2.  Acute kidney injury.  Treated with IV  fluids and improved.  3.  Hypokalemia: Replaced and resolved  4.  History of non-Hodgkin's lymphoma with chemo, radiation in 2000 with stem cell transplant, recurrence of non-Hodgkin's lymphoma again in 2004, Patient has chronic pulmonary fibrosis, has severe respiratory distress at baseline, chronic cough for several months, does have chronic pleural effusion.,  Patient already on chronic prednisone, oxygen 2 L, Mucomyst nebulizers, flutter valve at  home.  5. hypothyroidism: Continue Synthyroid.  6..  Paroxysmal atrial fibrillation, follows up with Dr. Ubaldo Glassing, patient is on amiodarone. Recent admission for sepsis, Patient main complaint is fatigue, chronic respiratory issues,  Discharge home.  CONSULTS OBTAINED:  Treatment Team:  Ottie Glazier, MD  DRUG ALLERGIES:   Allergies  Allergen Reactions  . Hydrocodone-Homatropine Itching  . Tussionex Pennkinetic Er [Hydrocod Polst-Cpm Polst Er] Itching  . Ciprofloxacin Rash    DISCHARGE MEDICATIONS:   Allergies as of 11/18/2018      Reactions   Hydrocodone-homatropine Itching   Tussionex Pennkinetic Er [hydrocod Polst-cpm Polst Er] Itching   Ciprofloxacin Rash      Medication List    STOP taking these medications   cefdinir 300 MG capsule Commonly known as:  OMNICEF     TAKE these medications   acetaminophen 325 MG tablet Commonly known as:  TYLENOL Take 2 tablets (650 mg total) by mouth every 6 (six) hours as needed for mild pain (headache).   acetylcysteine 20 % nebulizer solution Commonly known as:  MUCOMYST Take 4 mLs by nebulization every 6 (six) hours.   albuterol 108 (90 Base) MCG/ACT inhaler Commonly known as:  VENTOLIN HFA Inhale 1 puff into the lungs every 6 (six) hours as needed for wheezing or shortness of breath.   ALPRAZolam 0.5 MG tablet Commonly known as:  XANAX Take 1 tablet (0.5 mg total) by mouth at bedtime. What changed:    when to take this  reasons to take this   amiodarone 400 MG tablet Commonly known as:  PACERONE Take 1 tablet (400 mg total) by mouth daily.   citalopram 20 MG tablet Commonly known as:  CELEXA TAKE 1 TABLET BY MOUTH ONCE DAILY   feeding supplement (ENSURE ENLIVE) Liqd Take 237 mLs by mouth 3 (three) times daily between meals.   Flutter Devi Use 10-15 times daily   guaiFENesin 600 MG 12 hr tablet Commonly known as:  MUCINEX Take 600 mg by mouth daily.   ipratropium-albuterol 0.5-2.5 (3) MG/3ML  Soln Commonly known as:  DUONEB Take 3 mLs by nebulization every 6 (six) hours.   lactobacillus acidophilus & bulgar chewable tablet Chew 1 tablet by mouth 3 (three) times daily with meals.   levothyroxine 25 MCG tablet Commonly known as:  SYNTHROID TAKE 1 TABLET BY MOUTH ONCE DAILY ON AN EMPTY STOMACH. WAIT 30 MINUTES BEFORE TAKING OTHER MEDS. What changed:  See the new instructions.   multivitamin with minerals Tabs tablet Take 1 tablet by mouth daily.   omeprazole 20 MG capsule Commonly known as:  PRILOSEC TAKE 1 CAPSULE BY MOUTH TWICE DAILY BEFORE MEALS What changed:  See the new instructions.   potassium chloride SA 20 MEQ tablet Commonly known as:  K-DUR Take 1 tablet (20 mEq total) by mouth daily.   predniSONE 10 MG tablet Commonly known as:  DELTASONE Take 2.5 tablets by mouth daily.   vitamin C 500 MG tablet Commonly known as:  ASCORBIC ACID Take 1,000 mg by mouth daily.       Today   VITAL SIGNS:  Blood pressure (!) 88/50,  pulse (!) 104, temperature 98.6 F (37 C), temperature source Oral, resp. rate 20, height 5\' 3"  (1.6 m), weight 53 kg, SpO2 99 %.  I/O:  No intake or output data in the 24 hours ending 11/21/18 1028  PHYSICAL EXAMINATION:  Physical Exam  GENERAL:  73 y.o.-year-old patient lying in the bed with no acute distress.  LUNGS: Normal breath sounds bilaterally, no wheezing, rales,rhonchi or crepitation. No use of accessory muscles of respiration.  CARDIOVASCULAR: S1, S2 normal. No murmurs, rubs, or gallops.  ABDOMEN: Soft, non-tender, non-distended. Bowel sounds present. No organomegaly or mass.  NEUROLOGIC: Moves all 4 extremities. PSYCHIATRIC: The patient is alert and oriented x 3.  SKIN: No obvious rash, lesion, or ulcer.   DATA REVIEW:   CBC Recent Labs  Lab 11/19/18 1609  WBC 10.2  HGB 9.6*  HCT 30.9*  PLT 182    Chemistries  Recent Labs  Lab 11/19/18 1609  NA 139  K 3.8  CL 100  CO2 28  GLUCOSE 114*  BUN 31*   CREATININE 1.57*  CALCIUM 9.2  AST 21  ALT 20  ALKPHOS 83  BILITOT 0.9    Cardiac Enzymes No results for input(s): TROPONINI in the last 168 hours.  Microbiology Results  Results for orders placed or performed during the hospital encounter of 11/15/18  Blood Culture (routine x 2)     Status: None   Collection Time: 11/15/18  8:26 AM  Result Value Ref Range Status   Specimen Description BLOOD RIGHT ANTECUBITAL  Final   Special Requests   Final    BOTTLES DRAWN AEROBIC AND ANAEROBIC Blood Culture adequate volume   Culture   Final    NO GROWTH 5 DAYS Performed at [facility], [address], [place], [place] [address]    Report Status 12/08/2018 FINAL  Final  Urine Culture     Status: None   Collection Time: 11/15/18  8:26 AM  Result Value Ref Range Status   Specimen Description   Final    URINE, RANDOM Performed at [facility], [address] [place], [place] [address]    Special Requests   Final    NONE Performed at [facility], [address] [place], [place] [address]    Culture   Final    NO GROWTH Performed at [facility], [place] [address], [place], [place] [address]    Report Status 11/16/2018 FINAL  Final  Blood Culture (routine x 2)     Status: None   Collection Time: 11/15/18  8:35 AM  Result Value Ref Range Status   Specimen Description BLOOD LEFT ANTECUBITAL  Final   Special Requests   Final    BOTTLES DRAWN AEROBIC AND ANAEROBIC Blood Culture adequate volume   Culture   Final    NO GROWTH 5 DAYS Performed at [facility], [address] [place], [place] [address]    Report Status 12/08/2018 FINAL  Final  Respiratory Panel by PCR     Status: None   Collection Time: 11/15/18  4:45 PM  Result Value Ref Range Status   Adenovirus NOT DETECTED NOT DETECTED Final   Coronavirus 229E NOT DETECTED NOT DETECTED Final    Comment: (NOTE) The Coronavirus on the Respiratory Panel, DOES NOT test for the novel   Coronavirus (2019 nCoV)    Coronavirus HKU1 NOT DETECTED NOT DETECTED Final   Coronavirus NL63 NOT DETECTED NOT DETECTED Final   Coronavirus OC43 NOT DETECTED NOT DETECTED Final  Metapneumovirus NOT DETECTED NOT DETECTED Final   Rhinovirus / Enterovirus NOT DETECTED NOT DETECTED Final   Influenza A NOT DETECTED NOT DETECTED Final   Influenza B NOT DETECTED NOT DETECTED Final   Parainfluenza Virus 1 NOT DETECTED NOT DETECTED Final   Parainfluenza Virus 2 NOT DETECTED NOT DETECTED Final   Parainfluenza Virus 3 NOT DETECTED NOT DETECTED Final   Parainfluenza Virus 4 NOT DETECTED NOT DETECTED Final   Respiratory Syncytial Virus NOT DETECTED NOT DETECTED Final   Bordetella pertussis NOT DETECTED NOT DETECTED Final   Chlamydophila pneumoniae NOT DETECTED NOT DETECTED Final   Mycoplasma pneumoniae NOT DETECTED NOT DETECTED Final    Comment: Performed at [facility], [place] [address], [place], [place] [address]    RADIOLOGY:  Dg Chest Port 1 View  Result Date: 12/09/2018 CLINICAL DATA:  S/P  Thoracentesis left side today EXAM: PORTABLE CHEST - 1 VIEW COMPARISON:  the previous day's study FINDINGS: No pneumothorax. Heart size within normal limits. Aortic Atherosclerosis (ICD10-170.0). Left subclavian port catheter to the SVC. Persistent loculated apically and basilar components of right pleural effusion. Extensive coarse atelectasis/consolidation throughout much of the right lung, marginally improved centrally since prior study. Coarse interstitial and scattered airspace opacities in the left lung involving apex more than the base , slightly increased. IMPRESSION: No pneumothorax post left thoracentesis. Electronically Signed   By: Lucrezia Europe M.D.   On: 12/20/2018 11:38   Dg Chest Portable 1 View  Result Date: 11/19/2018 CLINICAL DATA:  Shortness of breath, sepsis, respiratory distress, history non-Hodgkin's lymphoma, asthma EXAM: PORTABLE CHEST 1 VIEW COMPARISON:  Portable exam 1623 hours  compared to 11/17/2018 FINDINGS: Stable LEFT subclavian Port-A-Cath. Normal heart size and mediastinal contours. Slight rotation to the RIGHT. Persistent diffuse BILATERAL pulmonary infiltrates especially RIGHT mid lung and RIGHT base. Loculated pleural effusion at RIGHT apex and RIGHT base unchanged. No pneumothorax. Bones demineralized. IMPRESSION: Persistent BILATERAL pulmonary infiltrates greater on RIGHT with loculated RIGHT pleural effusion. No significant interval change. Electronically Signed   By: Lavonia Dana M.D.   On: 11/19/2018 16:49   US Thoracentesis Asp Pleural Space W/img Guide  Result Date: 12/06/2018 INDICATION: Pneumonia, fever, pulmonary fibrosis. Complex chronic right pleural effusion. Increasing left pleural effusion. Thoracentesis requested. EXAM: ULTRASOUND GUIDED LEFT THORACENTESIS MEDICATIONS: None. COMPLICATIONS: None immediate.  No pneumothorax on follow-up radiograph. PROCEDURE: An ultrasound guided thoracentesis was thoroughly discussed with the patient and questions answered. The benefits, risks, alternatives and complications were also discussed. The patient understands and wishes to proceed with the procedure. Written consent was obtained. Ultrasound was performed to localize and mark an adequate pocket of fluid in the LEFT chest. The area was then prepped and draped in the normal sterile fashion. 1% Lidocaine was used for local anesthesia. Under ultrasound guidance a Safe-T-Centesis catheter was introduced. Thoracentesis was performed. The catheter was removed and a dressing applied. FINDINGS: A total of approximately 0.5 L of clear yellow fluid was removed. Samples were sent to the laboratory as requested by the clinical team. IMPRESSION: Successful ultrasound guided left thoracentesis yielding 0.5 L of pleural fluid. Electronically Signed   By: Lucrezia Europe M.D.   On: 12/15/2018 11:32    Follow up with PCP in 1 week.  Management plans discussed with the patient, family and  they are in agreement.  CODE STATUS:  Code Status History    Date Active Date Inactive Code Status Order ID Comments User Context   12/04/2018 0913 12/06/2018 1947 DNR [id number]  Bettey Costa,  MD Inpatient   11/19/2018 2100 11/26/2018 0913 Full Code [id number]  Fritzi Mandes, MD Inpatient   11/15/2018 1427 11/18/2018 1739 Full Code [id number]  Sela Hua, MD Inpatient   10/28/2018 0136 10/31/2018 1906 Full Code [id number]  Lance Coon, MD Inpatient   03/01/2018 2245 03/03/2018 1718 Full Code [id number]  Mayo, Pete Pelt, MD Inpatient   01/02/2018 1206 01/08/2018 2006 Full Code [id number]  [name], MD Inpatient   12/20/2017 0128 12/21/2017 1718 Full Code [id number]  Lance Coon, MD Inpatient   05/29/2017 1740 05/30/2017 1649 Full Code [id number]  Henreitta Leber, MD Inpatient   11/26/2016 1844 11/29/2016 1513 Full Code [id number]  Henreitta Leber, MD Inpatient   09/12/2016 0223 09/13/2016 1756 DNR [id number]  Harrie Foreman, MD ED   06/18/2016 1821 06/20/2016 1535 DNR [id number]  Vaughan Basta, MD Inpatient   11/29/2014 1405 12/05/2014 1550 DNR [id number]  Evlyn Kanner, NP Inpatient    Questions for Most Recent Historical Code Status (Order [id number])    Question Answer Comment   In the event of cardiac or respiratory ARREST Do not call a "code blue"    In the event of cardiac or respiratory ARREST Do not perform Intubation, CPR, defibrillation or ACLS    In the event of cardiac or respiratory ARREST Use medication by any route, position, wound care, and other measures to relive pain and suffering. May use oxygen, suction and manual treatment of airway obstruction as needed for comfort.         Advance Directive Documentation     Most Recent Value  Type of Advance Directive  Healthcare Power of Attorney, Living will  Pre-existing out of facility DNR order (yellow form or pink MOST form)  -  "MOST" Form in Place?  -      TOTAL TIME TAKING CARE OF THIS PATIENT ON DAY OF DISCHARGE: more than 30  minutes.   Anna Mcbride M.D on 11/21/2018 at 10:28 AM  Between 7am to 6pm - Pager - [phone]  After 6pm go to [email or web address] - password [email or web address] [facility]  Office  [phone]  CC: Primary care physician; Baxter Hire, MD  Note: This dictation was prepared with Dragon dictation along with smaller phrase technology. Any transcriptional errors that result from this process are unintentional.

## 2018-11-23 LAB — COMP PANEL: LEUKEMIA/LYMPHOMA

## 2018-11-23 LAB — BODY FLUID CULTURE: Culture: NO GROWTH

## 2018-11-23 LAB — CYTOLOGY - NON PAP

## 2018-11-23 LAB — PH, BODY FLUID: pH, Body Fluid: 7.8

## 2018-11-24 ENCOUNTER — Inpatient Hospital Stay: Payer: Medicare Other

## 2018-11-24 LAB — CULTURE, BLOOD (ROUTINE X 2)
Culture: NO GROWTH
Culture: NO GROWTH
Special Requests: ADEQUATE
Special Requests: ADEQUATE

## 2018-12-17 ENCOUNTER — Encounter: Payer: Self-pay | Admitting: Internal Medicine

## 2018-12-21 LAB — FUNGAL ORGANISM REFLEX

## 2018-12-21 LAB — FUNGUS CULTURE WITH STAIN

## 2018-12-21 LAB — FUNGUS CULTURE RESULT

## 2018-12-21 DEATH — deceased

## 2019-03-02 ENCOUNTER — Ambulatory Visit: Payer: Medicare Other | Admitting: Internal Medicine

## 2019-03-02 ENCOUNTER — Other Ambulatory Visit: Payer: Medicare Other

## 2019-08-07 IMAGING — CT CT ABDOMEN AND PELVIS WITH CONTRAST
2 of 5 series · 14 of 46 positions shown, 16 images · IV contrast (omnipaque)
Comparison: Abdominal CT 12/19/2017. Chest radiograph earlier this
day as well as 03/01/2018

CLINICAL DATA: Right-sided abdominal and flank pain. Acute
abdominal pain.

EXAM:
CT ABDOMEN AND PELVIS WITH CONTRAST
TECHNIQUE: Multidetector CT imaging of the abdomen and pelvis was performed
using the standard protocol following bolus administration of
intravenous contrast.
CONTRAST:  75mL OMNIPAQUE IOHEXOL 300 MG/ML  SOLN

[Series 2: routine abd/pel with · axial · 0.70mm/px · z∈[-596,-216]mm · 11 of 86 slices shown, 13 images]
[im 5/86  soft-tissue]
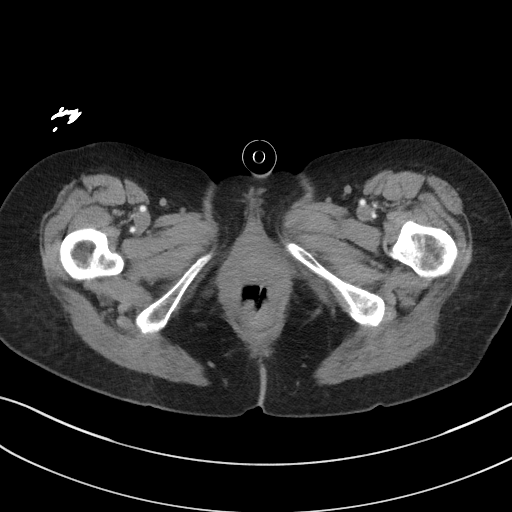
[im 5/86  bone]
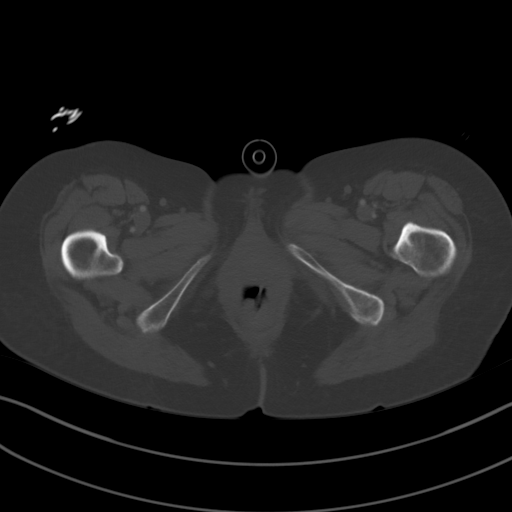
[im 15/86  soft-tissue]
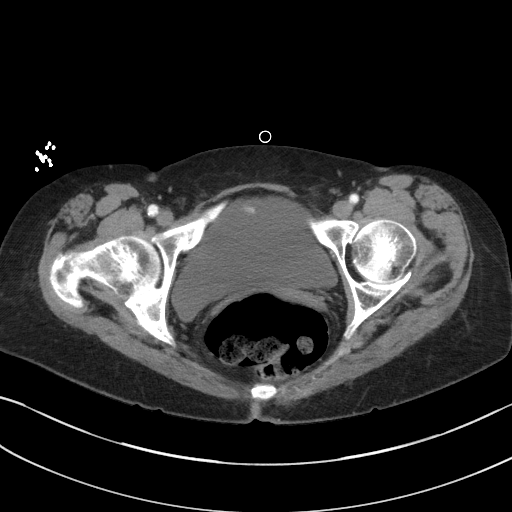
[im 19/86  soft-tissue]
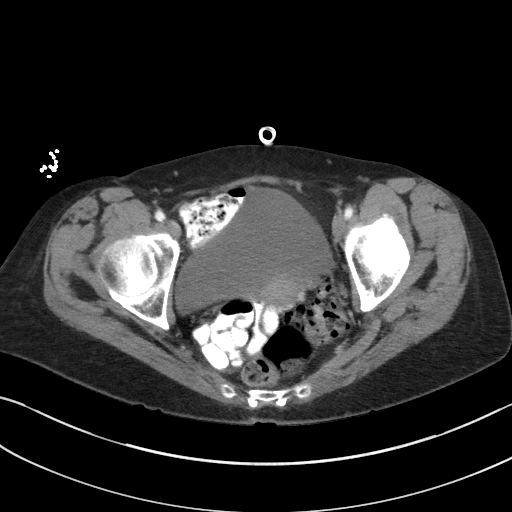
[im 29/86  soft-tissue]
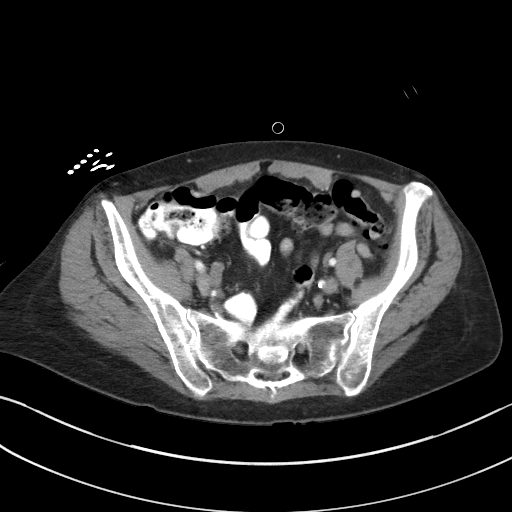
[im 34/86  soft-tissue]
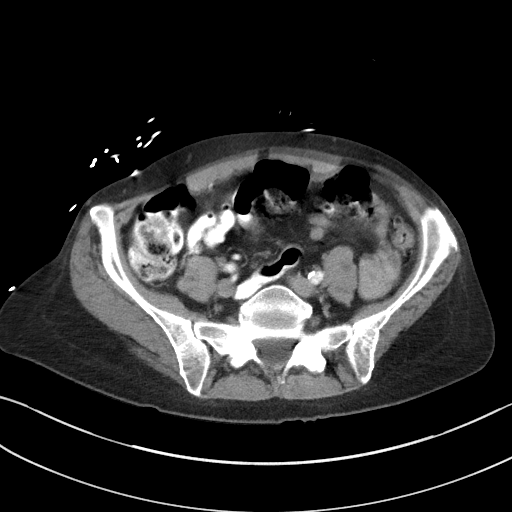
[im 43/86  soft-tissue]
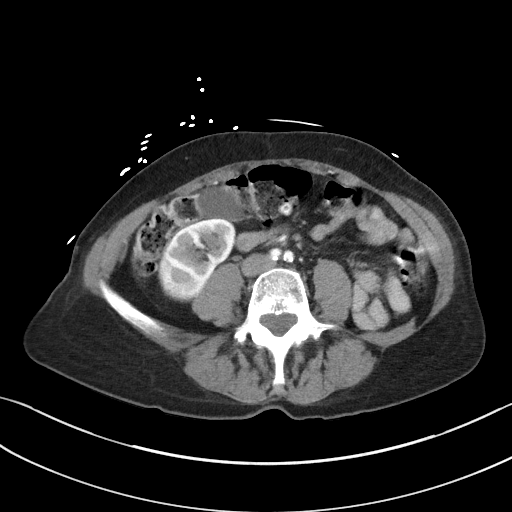
[im 52/86  soft-tissue]
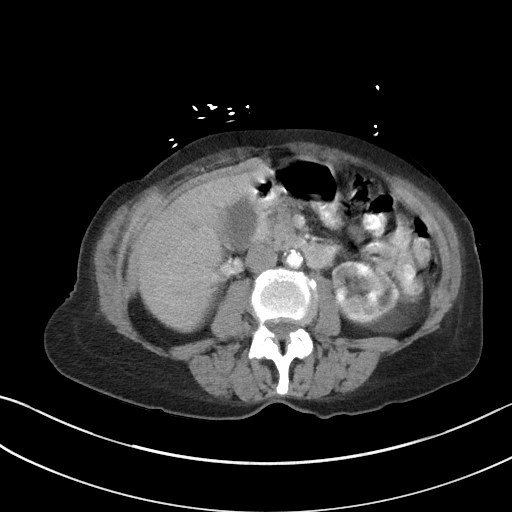
[im 57/86  soft-tissue]
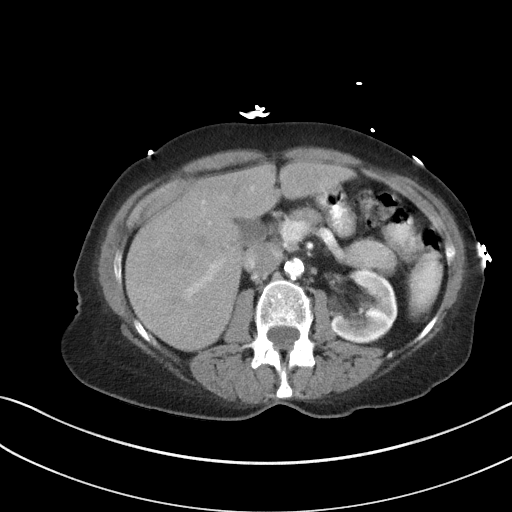
[im 67/86  soft-tissue]
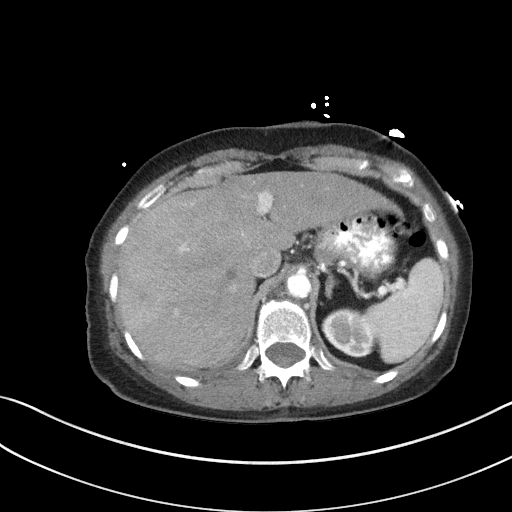
[im 67/86  bone]
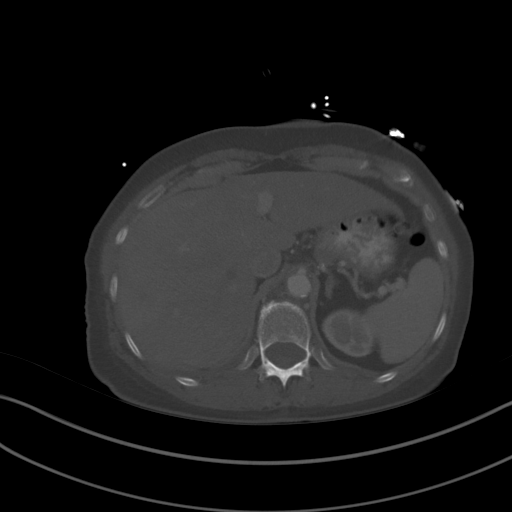
[im 71/86  soft-tissue]
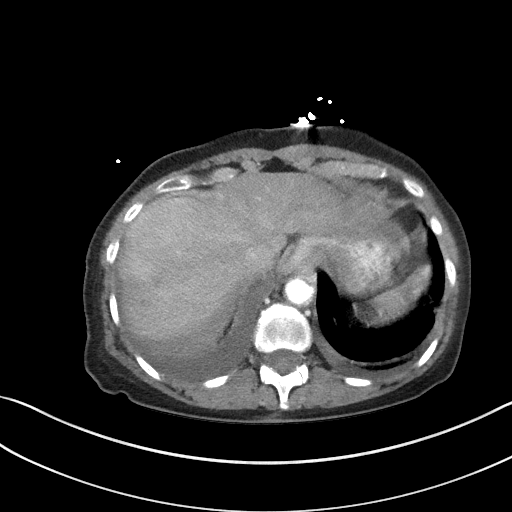
[im 81/86  soft-tissue]
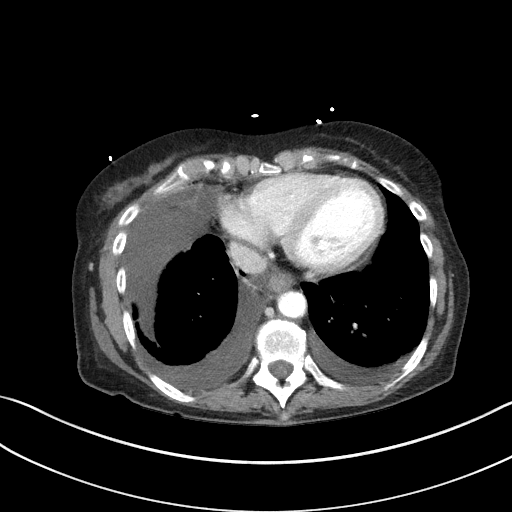

[Series 6: coronal st · coronal · 0.66mm/px · 3 of 63 slices shown]
[im 21/63  soft-tissue]
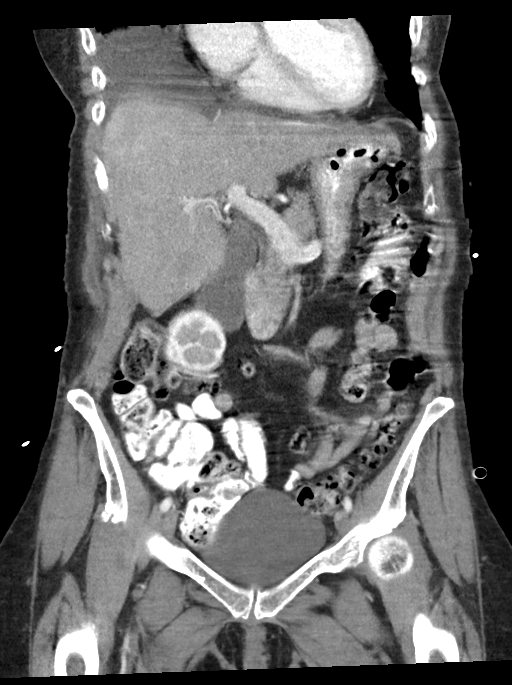
[im 28/63  soft-tissue]
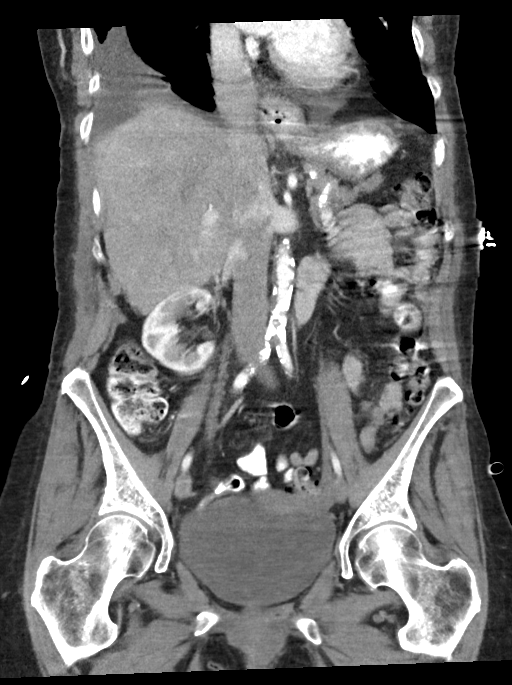
[im 35/63  soft-tissue]
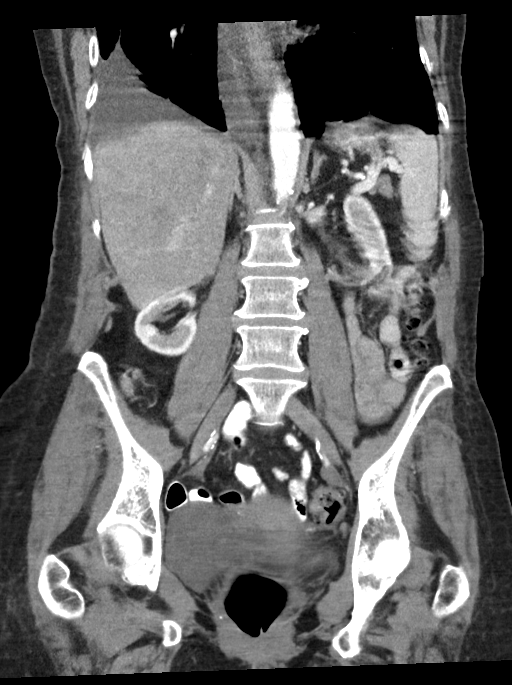

[14 of 46 positions shown; findings below may reference images not displayed]

FINDINGS: Motion artifact limits assessment of the chest, abdomen, and pelvis.

Lower chest: Small layering left pleural effusion. Moderate right
pleural effusion is partially loculated anteriorly. Vague geographic
ground-glass opacities in the left lung base which are partially
obscured by motion.

Hepatobiliary: Slight heterogeneous enhancement. Small cyst in the
right lobe is unchanged from prior exam. Gallbladder physiologically
distended, no calcified stone. No biliary dilatation.

Pancreas: Motion artifact. No ductal dilatation or inflammation.
There is a 5 mm cyst in the proximal pancreatic body, image 32
series 2.

Spleen: Normal in size without gross abnormality, motion obscured.

Adrenals/Urinary Tract: Normal adrenal glands. Right kidney is
slightly low in positioning. No hydronephrosis or perinephric edema.
Slight delayed excretion on delayed phase imaging. Motion artifact
limits detailed assessment. Urinary bladder is physiologically
distended. No bladder wall thickening.

Stomach/Bowel: Small hiatal hernia. Stomach grossly unremarkable. No
bowel obstruction. Enteric contrast reaches the colon.
Diverticulosis from the mid transverse colon distally. Diverticular
prominent in the sigmoid colon. No evidence of diverticulitis. Air
in stool distends the rectum. No rectal wall thickening. No obvious
bowel inflammation. Appendix not confidently visualized, no evidence
of appendicitis.

Vascular/Lymphatic: Advanced aortic atherosclerosis. IVC appears
prominent. Moderate branch atherosclerosis. No acute vascular
finding. Portal vein appears patent. No definite enlarged lymph
nodes in the abdomen or pelvis.

Reproductive: Uterus and bilateral adnexa are unremarkable. Ovaries
are quiescent, normal for age.

Other: No ascites or free air. No evidence of intra-abdominal
abscess.

Musculoskeletal: There are no acute or suspicious osseous
abnormalities. Degenerative change in the lower lumbar spine.
IMPRESSION: 1. Slight heterogeneity of the liver is nonspecific, but may be due
to elevated right heart pressures given prominent IVC. No other
acute finding in the abdomen or pelvis. Motion artifact slightly
limits detailed assessment.
2. Bilateral pleural effusions, small on the left and moderate on
the right and partially loculated. Chest x-ray earlier today
demonstrated similar radiographic appearance of the chest dating
back to 03/01/2018, and findings may be chronic.
3. Colonic diverticulosis without diverticulitis.
4. Incidental 5 mm cyst in the proximal pancreatic body. Recommend
follow up pre and post contrast MRI/MRCP or pancreatic protocol CT
in 2 years. This recommendation follows ACR consensus guidelines:
Management of Incidental Pancreatic Cysts: A White Paper of the ACR
Incidental Findings Committee. [HOSPITAL] [id number];[DATE].
5.  Advanced Aortic atherosclerosis ([id number]).

## 2019-08-07 IMAGING — DX PORTABLE CHEST - 1 VIEW
1 series · 1 of 1 positions shown · non-contrast
Comparison: 03/01/2018

CLINICAL DATA: Fever and cough

EXAM:
PORTABLE CHEST 1 VIEW

[chest ap]
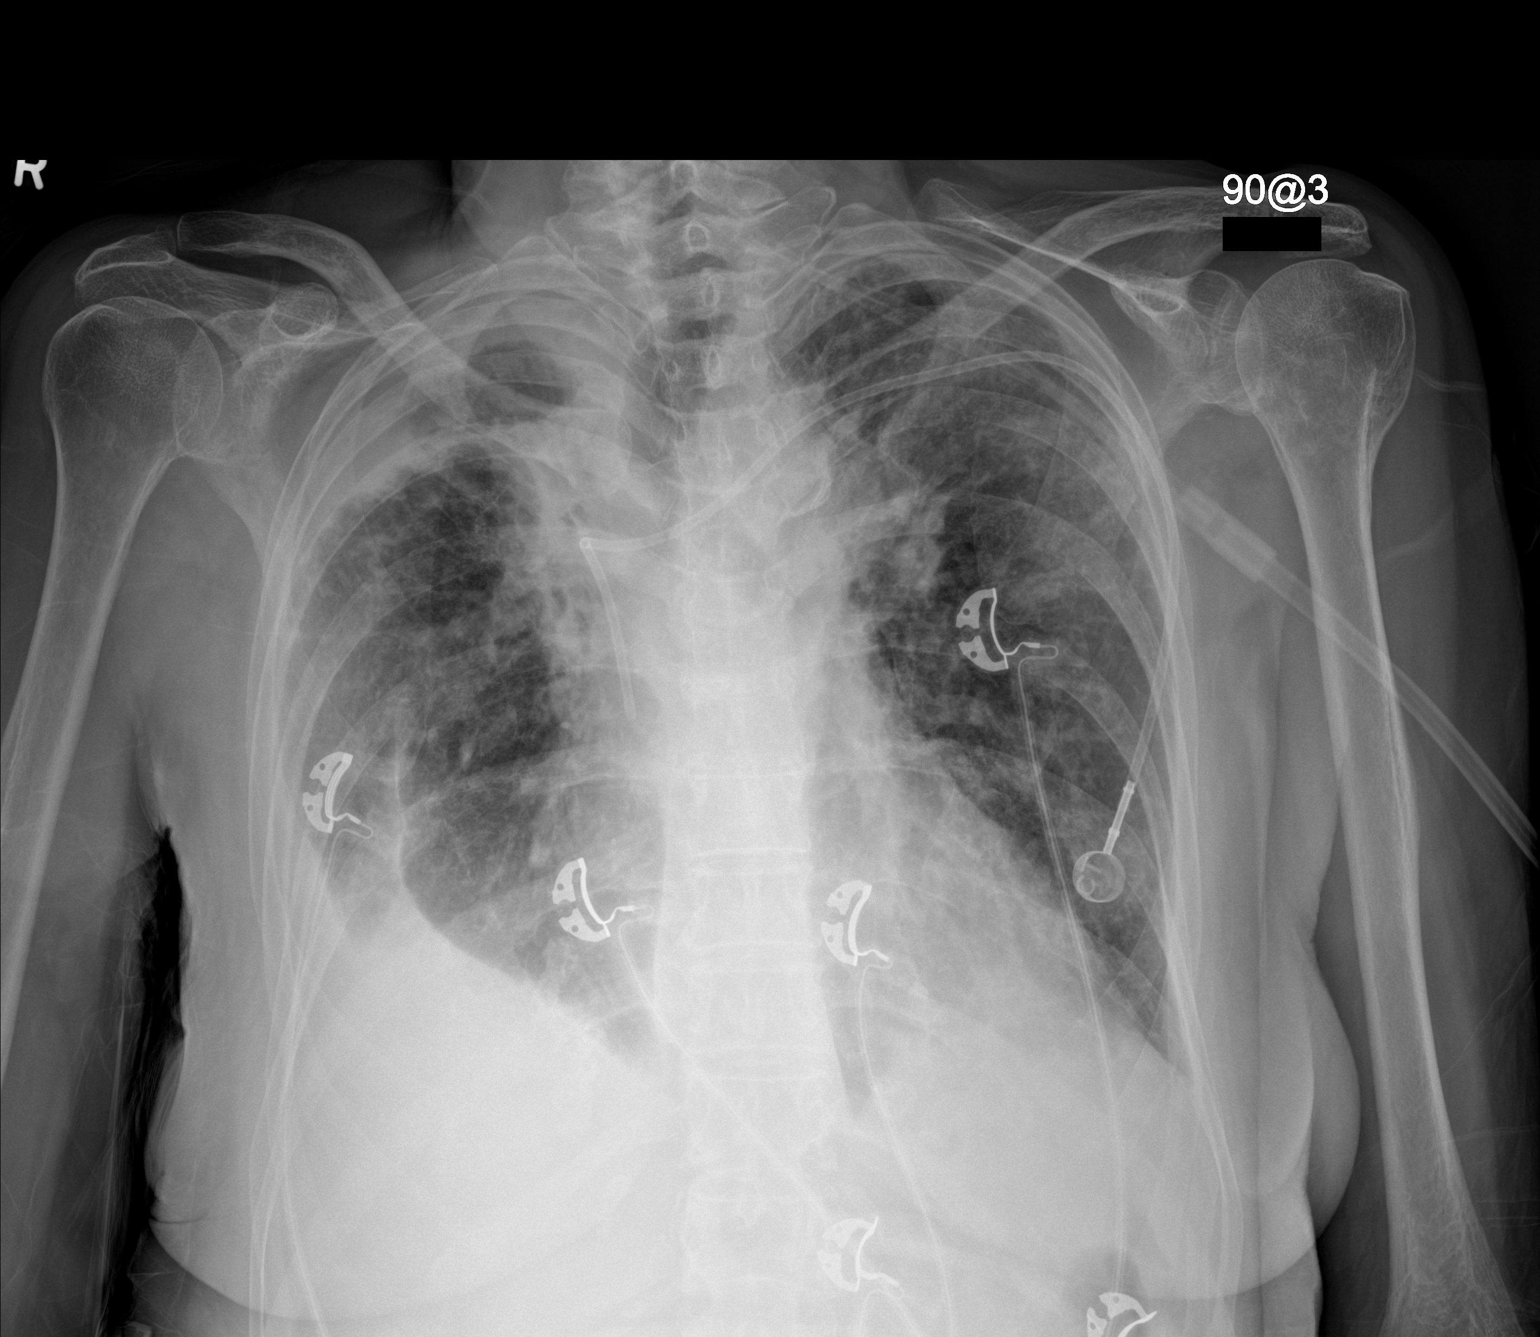

[1 of 1 positions shown; findings below may reference images not displayed]

FINDINGS: Extensive chronic lung disease apical scarring bilaterally.
Extensive on the right with retraction of the right hilum. Left
hilar retraction also unchanged due to apical scarring. Extensive
pleuroparenchymal scarring in the right lung base unchanged. Diffuse
parenchymal airspace density appears chronic and unchanged.

Port-A-Cath tip in the SVC.
IMPRESSION: Extensive chronic lung disease bilaterally right greater than left,
stable from prior studies. No superimposed acute abnormality.

## 2019-08-10 IMAGING — DX PORTABLE CHEST - 1 VIEW
2 series · 3 of 3 positions shown · non-contrast
Comparison: 10/29/2018

CLINICAL DATA: Acute respiratory failure.

EXAM:
PORTABLE CHEST 1 VIEW

[chest ap (1 of 2)]
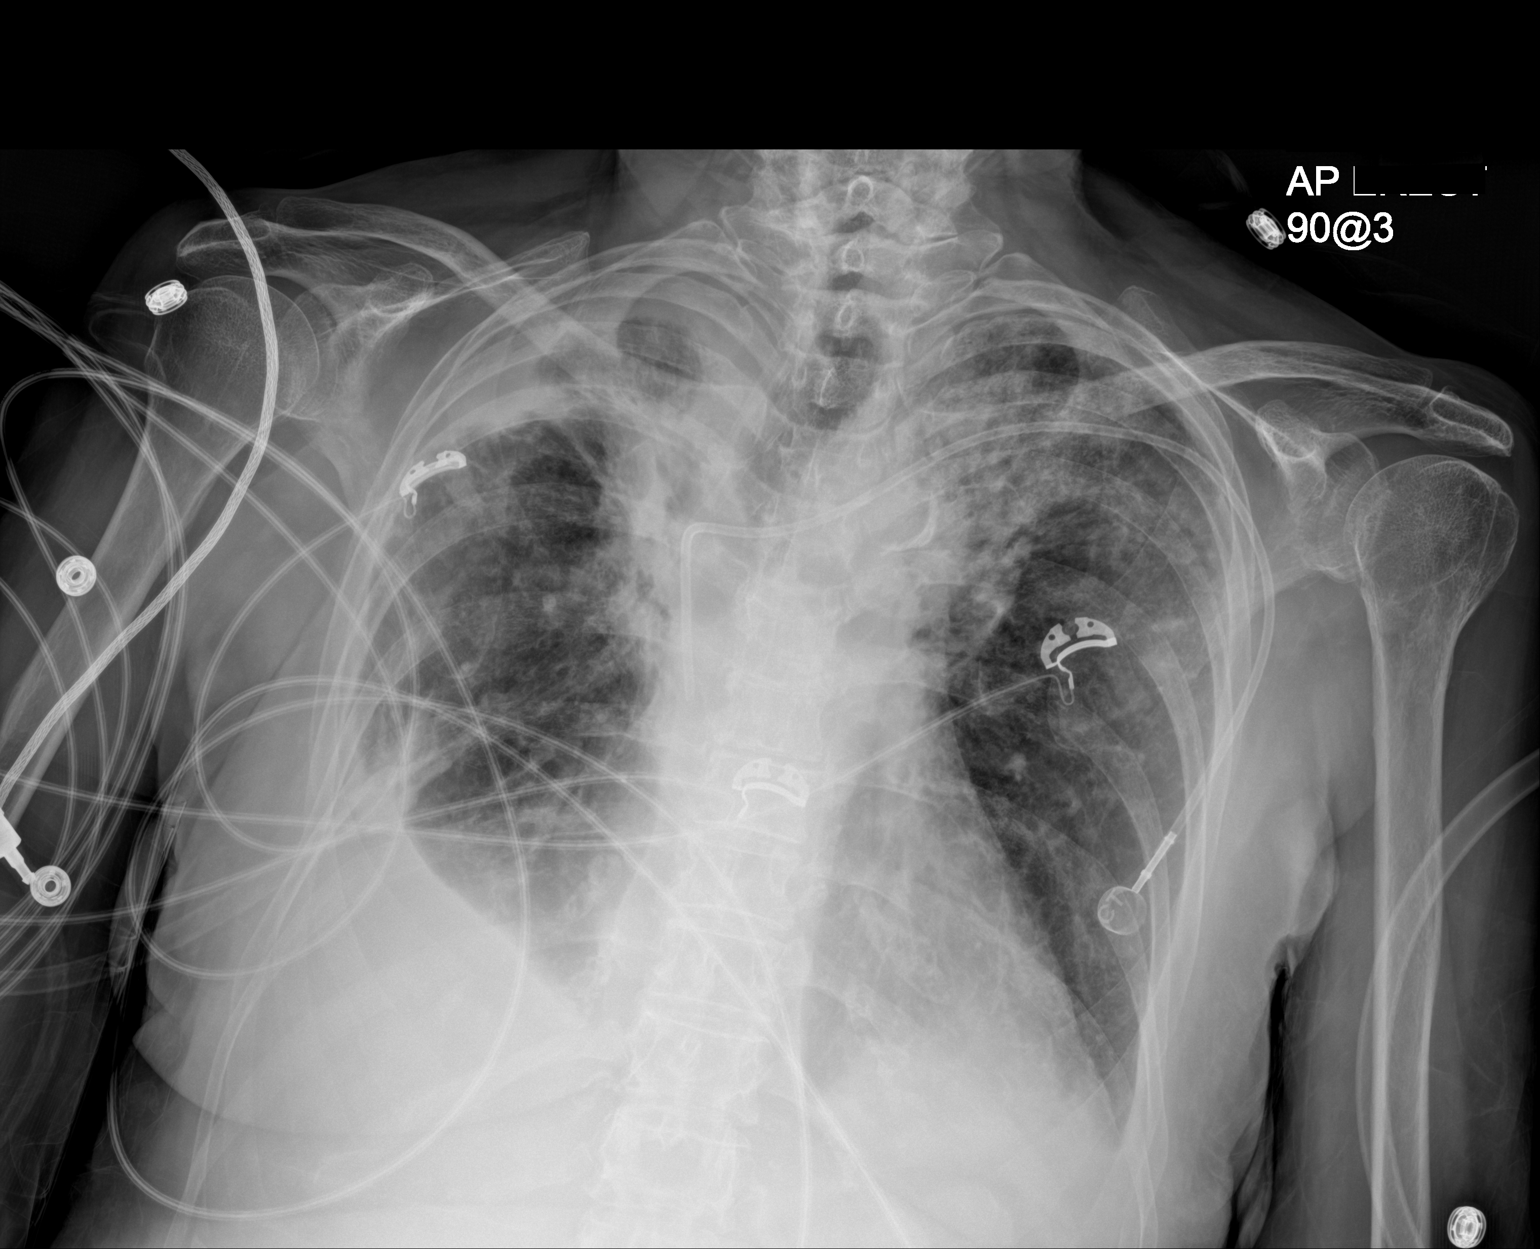

[Series 1: chest ap · 0.14mm/px · 2 of 2 slices shown (2 of 2)]
[im 1/2]
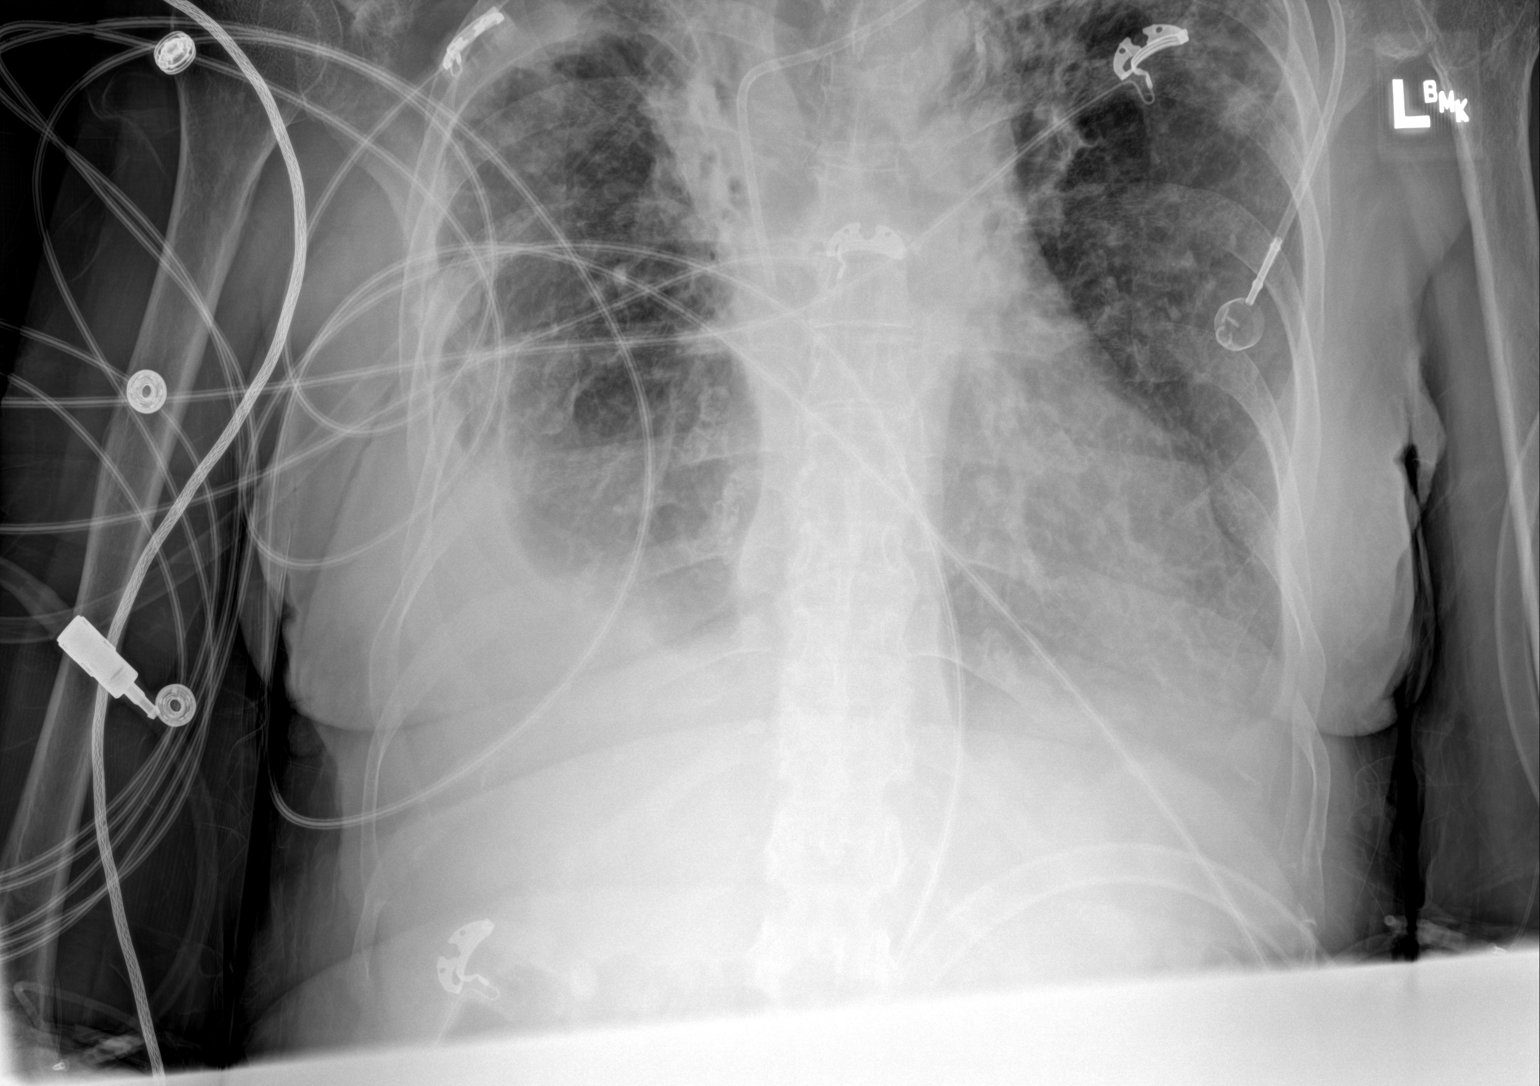
[im 2/2]
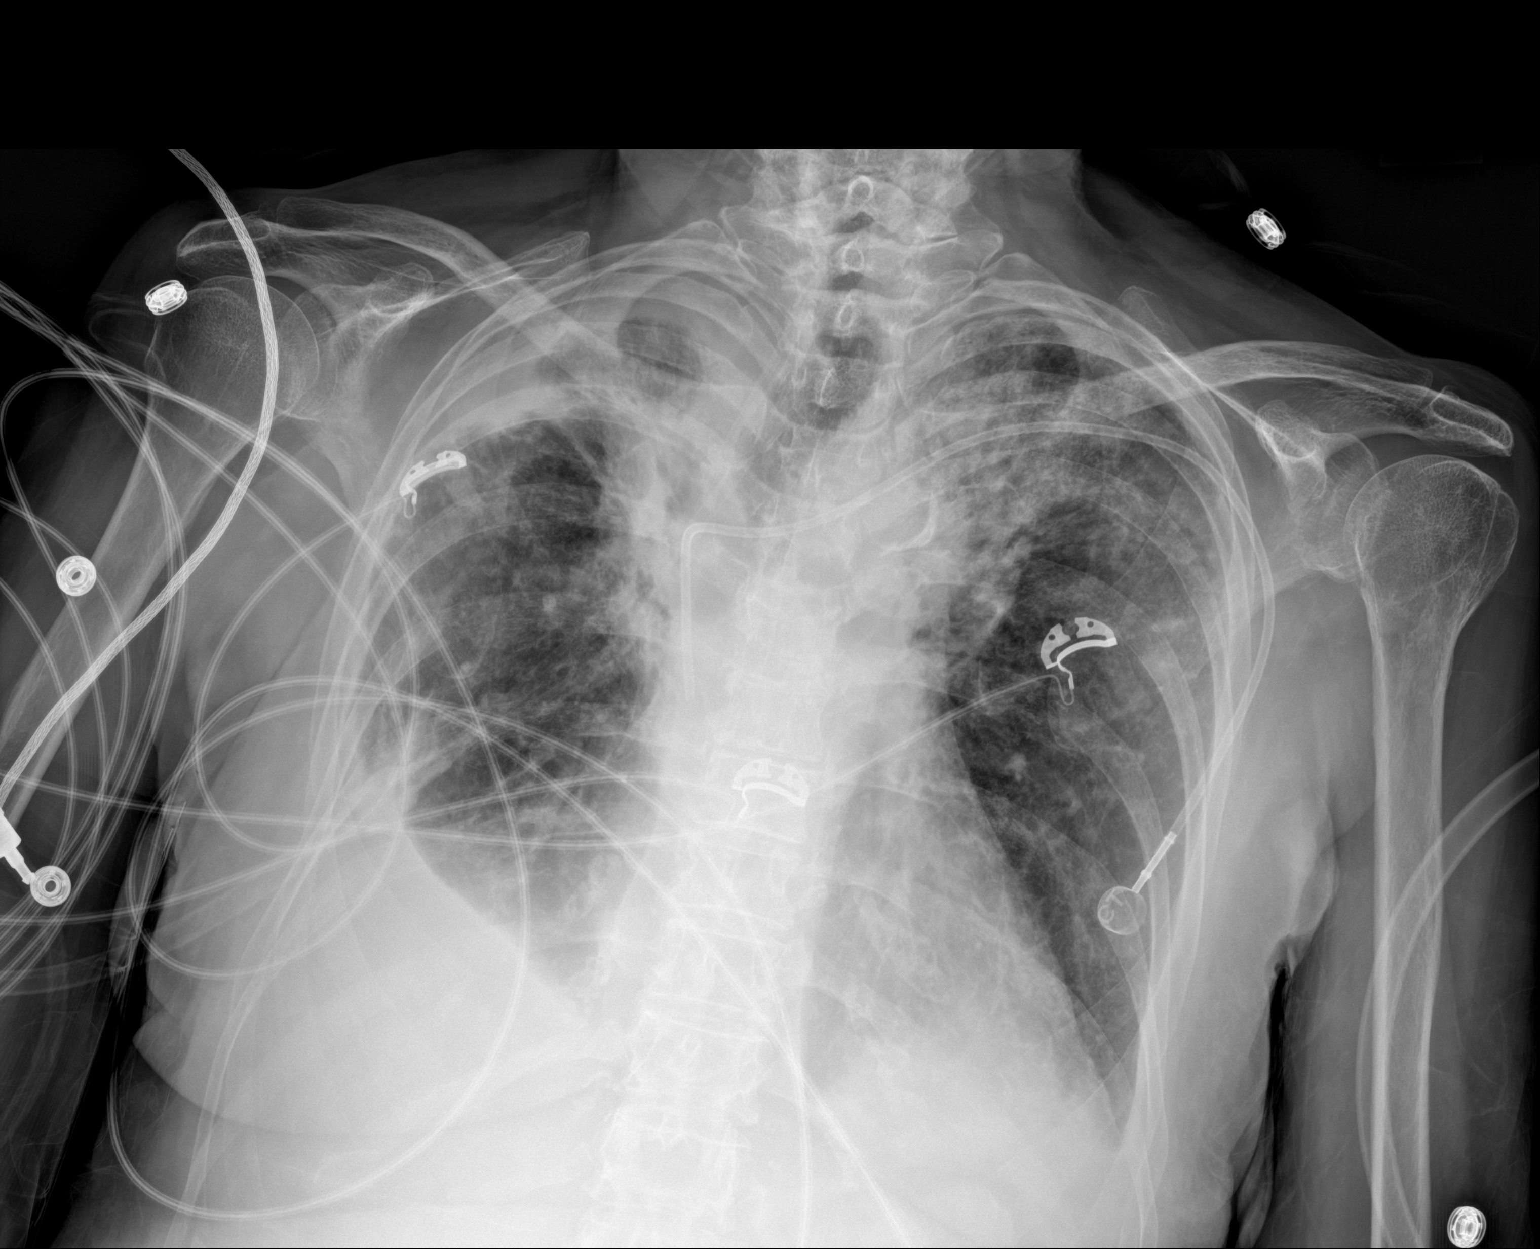

[3 of 3 positions shown; findings below may reference images not displayed]

FINDINGS: Power port inserted from the left is unchanged with the tip in the
SVC above the right atrium. Bilateral pulmonary and pleural scarring
persists, with the possibility of some coexistent acute patchy
infiltrates. Pattern appears quite similar to multiple previous
films. No worsening or new finding.
IMPRESSION: No worsening or new finding. Extensive bilateral pleural and
parenchymal scarring. Possible associated acute pulmonary
infiltrate, particularly suspicious in the left upper lobe.
